# Patient Record
Sex: Female | Born: 1945 | Race: White | Hispanic: No | Marital: Married | Smoking: Former smoker
Health system: Southern US, Community
[De-identification: ages and names within clinical notes are randomized; demographics above are authoritative.]

## PROBLEM LIST (undated history)

## (undated) DIAGNOSIS — Z803 Family history of malignant neoplasm of breast: Secondary | ICD-10-CM

## (undated) DIAGNOSIS — Z923 Personal history of irradiation: Secondary | ICD-10-CM

## (undated) DIAGNOSIS — K219 Gastro-esophageal reflux disease without esophagitis: Secondary | ICD-10-CM

## (undated) DIAGNOSIS — I38 Endocarditis, valve unspecified: Secondary | ICD-10-CM

## (undated) DIAGNOSIS — I10 Essential (primary) hypertension: Secondary | ICD-10-CM

## (undated) DIAGNOSIS — J189 Pneumonia, unspecified organism: Secondary | ICD-10-CM

## (undated) DIAGNOSIS — D472 Monoclonal gammopathy: Secondary | ICD-10-CM

## (undated) DIAGNOSIS — M8580 Other specified disorders of bone density and structure, unspecified site: Secondary | ICD-10-CM

## (undated) DIAGNOSIS — J439 Emphysema, unspecified: Secondary | ICD-10-CM

## (undated) DIAGNOSIS — Z9981 Dependence on supplemental oxygen: Secondary | ICD-10-CM

## (undated) DIAGNOSIS — R942 Abnormal results of pulmonary function studies: Secondary | ICD-10-CM

## (undated) DIAGNOSIS — C801 Malignant (primary) neoplasm, unspecified: Secondary | ICD-10-CM

## (undated) DIAGNOSIS — M1990 Unspecified osteoarthritis, unspecified site: Secondary | ICD-10-CM

## (undated) DIAGNOSIS — K5790 Diverticulosis of intestine, part unspecified, without perforation or abscess without bleeding: Secondary | ICD-10-CM

## (undated) DIAGNOSIS — N838 Other noninflammatory disorders of ovary, fallopian tube and broad ligament: Secondary | ICD-10-CM

## (undated) DIAGNOSIS — C44310 Basal cell carcinoma of skin of unspecified parts of face: Secondary | ICD-10-CM

## (undated) DIAGNOSIS — K8689 Other specified diseases of pancreas: Secondary | ICD-10-CM

## (undated) DIAGNOSIS — Z720 Tobacco use: Secondary | ICD-10-CM

## (undated) DIAGNOSIS — K635 Polyp of colon: Secondary | ICD-10-CM

## (undated) DIAGNOSIS — E785 Hyperlipidemia, unspecified: Secondary | ICD-10-CM

## (undated) HISTORY — DX: Abnormal results of pulmonary function studies: R94.2

## (undated) HISTORY — DX: Monoclonal gammopathy: D47.2

## (undated) HISTORY — DX: Other noninflammatory disorders of ovary, fallopian tube and broad ligament: N83.8

## (undated) HISTORY — DX: Polyp of colon: K63.5

## (undated) HISTORY — DX: Tobacco use: Z72.0

## (undated) HISTORY — DX: Essential (primary) hypertension: I10

## (undated) HISTORY — DX: Family history of malignant neoplasm of breast: Z80.3

## (undated) HISTORY — DX: Emphysema, unspecified: J43.9

## (undated) HISTORY — PX: VAGINAL HYSTERECTOMY: SHX2639

## (undated) HISTORY — DX: Hyperlipidemia, unspecified: E78.5

## (undated) HISTORY — PX: TONSILLECTOMY: SHX5217

## (undated) HISTORY — DX: Basal cell carcinoma of skin of unspecified parts of face: C44.310

## (undated) HISTORY — DX: Diverticulosis of intestine, part unspecified, without perforation or abscess without bleeding: K57.90

## (undated) HISTORY — DX: Other specified disorders of bone density and structure, unspecified site: M85.80

---

## 1997-11-26 DIAGNOSIS — D472 Monoclonal gammopathy: Secondary | ICD-10-CM

## 1997-11-26 HISTORY — DX: Monoclonal gammopathy: D47.2

## 1999-03-06 ENCOUNTER — Other Ambulatory Visit: Admission: RE | Admit: 1999-03-06 | Discharge: 1999-03-06 | Payer: Self-pay | Admitting: *Deleted

## 2000-03-29 ENCOUNTER — Emergency Department (HOSPITAL_COMMUNITY): Admission: EM | Admit: 2000-03-29 | Discharge: 2000-03-29 | Payer: Self-pay | Admitting: Emergency Medicine

## 2000-08-26 ENCOUNTER — Encounter: Payer: Self-pay | Admitting: *Deleted

## 2000-08-26 ENCOUNTER — Encounter: Admission: RE | Admit: 2000-08-26 | Discharge: 2000-08-26 | Payer: Self-pay | Admitting: *Deleted

## 2001-02-25 ENCOUNTER — Encounter: Payer: Self-pay | Admitting: Internal Medicine

## 2001-02-25 ENCOUNTER — Encounter: Admission: RE | Admit: 2001-02-25 | Discharge: 2001-02-25 | Payer: Self-pay | Admitting: Internal Medicine

## 2001-06-11 ENCOUNTER — Encounter: Payer: Self-pay | Admitting: Oncology

## 2001-06-11 ENCOUNTER — Ambulatory Visit (HOSPITAL_COMMUNITY): Admission: RE | Admit: 2001-06-11 | Discharge: 2001-06-11 | Payer: Self-pay | Admitting: Oncology

## 2001-08-27 ENCOUNTER — Encounter: Payer: Self-pay | Admitting: Internal Medicine

## 2001-08-27 ENCOUNTER — Encounter: Admission: RE | Admit: 2001-08-27 | Discharge: 2001-08-27 | Payer: Self-pay | Admitting: Internal Medicine

## 2002-05-21 ENCOUNTER — Encounter: Payer: Self-pay | Admitting: Surgery

## 2002-05-21 ENCOUNTER — Encounter: Admission: RE | Admit: 2002-05-21 | Discharge: 2002-05-21 | Payer: Self-pay | Admitting: Surgery

## 2002-09-15 ENCOUNTER — Encounter: Admission: RE | Admit: 2002-09-15 | Discharge: 2002-09-15 | Payer: Self-pay | Admitting: Internal Medicine

## 2002-09-15 ENCOUNTER — Encounter: Payer: Self-pay | Admitting: Internal Medicine

## 2003-09-21 ENCOUNTER — Encounter: Admission: RE | Admit: 2003-09-21 | Discharge: 2003-09-21 | Payer: Self-pay | Admitting: Internal Medicine

## 2003-12-28 DIAGNOSIS — K635 Polyp of colon: Secondary | ICD-10-CM

## 2003-12-28 HISTORY — DX: Polyp of colon: K63.5

## 2004-01-06 ENCOUNTER — Ambulatory Visit (HOSPITAL_COMMUNITY): Admission: RE | Admit: 2004-01-06 | Discharge: 2004-01-06 | Payer: Self-pay | Admitting: Gastroenterology

## 2005-10-03 ENCOUNTER — Encounter: Admission: RE | Admit: 2005-10-03 | Discharge: 2005-10-03 | Payer: Self-pay | Admitting: Internal Medicine

## 2005-11-26 DIAGNOSIS — N838 Other noninflammatory disorders of ovary, fallopian tube and broad ligament: Secondary | ICD-10-CM

## 2005-11-26 HISTORY — PX: OMENTECTOMY: SHX2098

## 2005-11-26 HISTORY — DX: Other noninflammatory disorders of ovary, fallopian tube and broad ligament: N83.8

## 2005-11-26 HISTORY — PX: BILATERAL SALPINGOOPHORECTOMY: SHX1223

## 2005-12-27 ENCOUNTER — Encounter: Admission: RE | Admit: 2005-12-27 | Discharge: 2005-12-27 | Payer: Self-pay | Admitting: Internal Medicine

## 2006-01-31 ENCOUNTER — Encounter: Admission: RE | Admit: 2006-01-31 | Discharge: 2006-01-31 | Payer: Self-pay | Admitting: Internal Medicine

## 2006-02-13 ENCOUNTER — Ambulatory Visit: Admission: RE | Admit: 2006-02-13 | Discharge: 2006-02-13 | Payer: Self-pay | Admitting: Gynecologic Oncology

## 2006-03-05 ENCOUNTER — Inpatient Hospital Stay (HOSPITAL_COMMUNITY): Admission: RE | Admit: 2006-03-05 | Discharge: 2006-03-08 | Payer: Self-pay | Admitting: Obstetrics & Gynecology

## 2006-03-05 ENCOUNTER — Encounter (INDEPENDENT_AMBULATORY_CARE_PROVIDER_SITE_OTHER): Payer: Self-pay | Admitting: *Deleted

## 2006-03-19 ENCOUNTER — Ambulatory Visit: Admission: RE | Admit: 2006-03-19 | Discharge: 2006-03-19 | Payer: Self-pay | Admitting: Gynecologic Oncology

## 2006-09-11 ENCOUNTER — Ambulatory Visit: Admission: RE | Admit: 2006-09-11 | Discharge: 2006-09-11 | Payer: Self-pay | Admitting: Gynecologic Oncology

## 2007-04-23 ENCOUNTER — Ambulatory Visit: Admission: RE | Admit: 2007-04-23 | Discharge: 2007-04-23 | Payer: Self-pay | Admitting: Gynecologic Oncology

## 2007-10-21 ENCOUNTER — Ambulatory Visit: Admission: RE | Admit: 2007-10-21 | Discharge: 2007-10-21 | Payer: Self-pay | Admitting: Gynecologic Oncology

## 2007-11-27 HISTORY — PX: CATARACT EXTRACTION: SUR2

## 2008-04-07 ENCOUNTER — Ambulatory Visit: Admission: RE | Admit: 2008-04-07 | Discharge: 2008-04-07 | Payer: Self-pay | Admitting: Gynecologic Oncology

## 2008-11-26 DIAGNOSIS — C44310 Basal cell carcinoma of skin of unspecified parts of face: Secondary | ICD-10-CM

## 2008-11-26 HISTORY — DX: Basal cell carcinoma of skin of unspecified parts of face: C44.310

## 2008-11-26 HISTORY — PX: BASAL CELL CARCINOMA EXCISION: SHX1214

## 2009-04-13 ENCOUNTER — Ambulatory Visit: Admission: RE | Admit: 2009-04-13 | Discharge: 2009-04-13 | Payer: Self-pay | Admitting: Gynecologic Oncology

## 2009-12-15 ENCOUNTER — Encounter: Admission: RE | Admit: 2009-12-15 | Discharge: 2009-12-15 | Payer: Self-pay | Admitting: Family Medicine

## 2010-04-05 ENCOUNTER — Ambulatory Visit: Admission: RE | Admit: 2010-04-05 | Discharge: 2010-04-05 | Payer: Self-pay | Admitting: Gynecologic Oncology

## 2010-11-26 DIAGNOSIS — K5790 Diverticulosis of intestine, part unspecified, without perforation or abscess without bleeding: Secondary | ICD-10-CM

## 2010-11-26 HISTORY — PX: COLONOSCOPY: SHX174

## 2010-11-26 HISTORY — DX: Diverticulosis of intestine, part unspecified, without perforation or abscess without bleeding: K57.90

## 2010-11-28 LAB — HM COLONOSCOPY

## 2010-12-05 ENCOUNTER — Encounter
Admission: RE | Admit: 2010-12-05 | Discharge: 2010-12-05 | Payer: Self-pay | Source: Home / Self Care | Attending: Gastroenterology | Admitting: Gastroenterology

## 2010-12-22 ENCOUNTER — Other Ambulatory Visit: Payer: Self-pay | Admitting: Family Medicine

## 2010-12-22 DIAGNOSIS — Z1239 Encounter for other screening for malignant neoplasm of breast: Secondary | ICD-10-CM

## 2011-01-01 ENCOUNTER — Ambulatory Visit
Admission: RE | Admit: 2011-01-01 | Discharge: 2011-01-01 | Disposition: A | Discharge status: Home / Self Care | Payer: Self-pay | Source: Ambulatory Visit | Attending: Family Medicine | Admitting: Family Medicine

## 2011-01-01 DIAGNOSIS — Z1239 Encounter for other screening for malignant neoplasm of breast: Secondary | ICD-10-CM

## 2011-03-06 LAB — INHIBIN A: Inhibin-A: <1 pg/mL

## 2011-04-10 NOTE — Consult Note (Signed)
NAME:  Tiffany [name], Tiffany [name] NO.:  [id number]   MEDICAL RECORD NO.:  [id number]          PATIENT TYPE:  OUT   LOCATION:  GYN                          FACILITY:  [facility]   PHYSICIAN:  Paola A. Duard Brady, MD    DATE OF BIRTH:  21-Jul-1946   DATE OF CONSULTATION:  04/13/2009  DATE OF DISCHARGE:                                 CONSULTATION   HISTORY:  The patient is a very pleasant 65 year old with history of  stage IA granulosa cell tumor who underwent exploratory laparotomy, BSO,  pelvic lymphadenectomy and omentectomy March 2007.  All biopsies, lymph  nodes and adhesions were negative.  Inhibin level at the time of  diagnosis was elevated at 77.  When I most recently saw her in May 2009,  her inhibin A was less than 1.0.  She comes in today for followup.  She  is overall doing quite well.   REVIEW OF SYSTEMS:  She denies any chest pain, shortness of breath,  nausea, vomiting, fevers, chills, headaches or visual changes.  She  denies any unintentional weight loss or weight gain.  She did have  cataract surgery in October 2009.  She states that has helped her  significantly, though she does still have to wear her eye glasses due to  an astigmatism.   HEALTH MAINTENANCE:  She is overdue for mammogram.  She has not had one  including last year.  She states she is due for a colonoscopy soon.  We  followed up on the skin changes that she mentioned last year.  She has  not been back to see a dermatologist.   FAMILY HISTORY:  There are no new medical problems.   MEDICATIONS:  None.   PHYSICAL EXAMINATION:  VITAL SIGNS:  Weight 165 pounds, blood pressure  130/70.  GENERAL:  Well-nourished, well-developed female in no acute distress.  NECK:  Supple.  There is no lymphadenopathy, no thyromegaly.  LUNGS:  Clear to auscultation bilaterally.  CARDIOVASCULAR:  Regular rate and rhythm.  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  It measures  approximately 3 mm.  Neck is without lymphadenopathy.  ABDOMEN:  She has a well-healed vertical midline incision.  There is no  evidence of an incisional hernia.  Abdomen is soft, nontender,  nondistended.  There are no palpable masses or hepatosplenomegaly.  Groins are negative for adenopathy.  EXTREMITIES:  There is no edema.  PELVIC:  External genitalia is within normal limits.  Bimanual  examination reveals no masses or nodularity.  RECTAL:  Confirms.   ASSESSMENT:  A 65 year old with history of stage I granulosa cell tumor  who is doing well with no evidence of recurrent disease.   PLAN:  I have encouraged her to follow up with regards to her mammograms  as well as her colonoscopy.  In addition, I have encouraged her to see  dermatology.  She has not been back to see one in some time.  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  She does not recall the name of her  dermatologist, but we have given her a name of one should she needed a  new one.  She will return to see [place] in 1 year p.r.n.  We will check a  total inhibin.      Paola A. Duard Brady, MD  Electronically Signed     PAG/MEDQ  D:  04/13/2009  T:  04/13/2009  Job:  [id number]   cc:   Devoria Albe, M.D.  [address]  [place], [place] [address]   Telford Nab, R.N.  [address]  [place], [place] [address]

## 2011-04-10 NOTE — Consult Note (Signed)
NAME:  Tiffany [name], Tiffany [name] NO.:  [id number]   MEDICAL RECORD NO.:  [id number]          PATIENT TYPE:  OUT   LOCATION:  GYN                          FACILITY:  [facility]   PHYSICIAN:  Paola A. Duard Brady, MD    DATE OF BIRTH:  1946-02-04   DATE OF CONSULTATION:  04/07/2008  DATE OF DISCHARGE:                                 CONSULTATION   Ms. Record is a very pleasant 65 year old with history of stage IA  granulosa cell tumor status post exploratory laparotomy, BSO, pelvic  lymphadenectomy and omentectomy March of 2007.  All biopsies, lymph  nodes and adhesions were negative.  Her inhibin level at the time of  diagnosis was elevated at 77.  She did well postoperatively with  dispositioned to close followup.  I last saw her in November of 2005 at  which time her exam was unremarkable.  Her inhibin was less than 1.  She  comes in today.  She is overall from a physical standpoint doing quite  well.  She has otherwise had a stressful day with an AV and that her  bank card was rejected by the bank and subsequently not returned.  She  needs to go to the bank to get that issue clarified.  She also went in  for consideration of cataract surgery and was diagnosed with glaucoma  and now the cataracts which are bothersome to her, the right being worse  than the left, cannot be fixed until she addresses the glaucoma.  She  has an appointment with a glaucoma specialist in June of 2009.  She  otherwise denies any complaints.  She denies any abdominal pain, chest  pain, shortness of breath, nausea, vomiting, fevers, chills, headaches  or visual changes.  She denies any unintentional weight loss, weight  gain or pelvic pressure.   MEDICATION LIST:  Is reviewed and is unchanged.   HEALTH MAINTENANCE:  She had a colonoscopy 5 years ago.  She will  discuss with her primary physician if she needs another one.  Mammogram,  her last one was right prior to her diagnosis of her ovarian cancer  which  was made when she could not lie on the table for the breast MRI.   PHYSICAL EXAMINATION:  VITAL SIGNS:  Weight 157 pounds which is stable.  GENERAL:  A well-nourished, well-developed female in no acute distress.  NECK:  Supple.  There is no lymphadenopathy, no thyromegaly.  LUNGS:  Clear to auscultation bilaterally.  CARDIOVASCULAR:  Regular rate and rhythm.  ABDOMEN:  Shows a well-healed vertical midline incision.  There is no  evidence of an incisional hernia.  Abdomen is soft, nontender,  nondistended.  There are no palpable masses or hepatosplenomegaly.  Groins are negative for adenopathy.  EXTREMITIES:  There is no edema.  PELVIC:  External genitalia is within normal limits though atrophic.  Bimanual examination reveals no masses or nodularity.  RECTAL:  Confirms.  SKIN:  She has numerous hyperkeratotic raised lesions that could be  consistent with actinic keratoses or even small squamous cell carcinomas  changes.  There  are significant changes consistent with prolonged sun  exposure.   ASSESSMENT:  75. A 65 year old with history of a stage I granulosa cell tumor who is      doing well from an oncologic standpoint and has no evidence of      recurrent disease.  As she is 2 years out we can go to q. yearly      visits.  We will check a total inhibin including A and B today.  2. We have recommend that she go back to the breast center which is      where she has her mammograms and has her mammogram done as she is      over time.  3. She will discuss with her primary care physician when she is due      for repeat colonoscopy.  4. I  have recommended that she see dermatology for followup of      numerous skin lesions.  She will take the responsibility of all the      above.  She will return to see [place] in 1 year.      Paola A. Duard Brady, MD  Electronically Signed     PAG/MEDQ  D:  04/07/2008  T:  04/07/2008  Job:  [id number]   cc:   Devoria Albe, M.D.  [address]  [place],  [place] [address]   Telford Nab, R.N.  [address]  [place], [place] [address]

## 2011-04-10 NOTE — Consult Note (Signed)
NAME:  Tiffany [name], Tiffany [name] NO.:  [id number]   MEDICAL RECORD NO.:  [id number]          PATIENT TYPE:  OUT   LOCATION:  GYN                          FACILITY:  [facility]   PHYSICIAN:  Paola A. Duard Brady, MD    DATE OF BIRTH:  15-Jul-1946   DATE OF CONSULTATION:  04/23/2007  DATE OF DISCHARGE:                                 CONSULTATION   Tiffany [name] is a 65 year old who was noted to have a large abdominopelvic  mass in March of 2007 with a normal CA-125.  In April of 2007 she  underwent exploratory laparotomy, BSO, pelvic lymphadenectomy, and  omentectomy with final pathology consistent with stage Ia granulosa cell  tumor.  All lymph nodes, pelvic adhesions, and biopsies were negative.  Her inhibin was elevated to 77 at the time of surgery.  I last saw her  in October of 2007 at which time her exam was unremarkable.  Her inhibin  was checked and was less than 1.  She was started on Premarin 0.3 mg  daily secondary to vasomotor symptoms that did not respond to medical  management.  She comes in today.  She is overall feeling very well.  She  is sleeping very well.  She occasionally still has hot flashes, but they  are not interfering with her quality of life.  She continues working at  [employer].  She denies any abdominal pain, any chest pain, shortness of  breath, nausea, vomiting, fevers, chills, headaches, or visual changes.  She denies any change in her bowel or bladder habits, any early satiety,  or pelvic pressure.  She feels like she has injured her left thumb, as  it hurts when she buttons her pants and does heavy lifting.  There is  loss of sensory or motor function, just pain associated with that this.  She has not tried any over-the-counter medications.   MEDICATIONS:  Medication list is reviewed and is unchanged.   PHYSICAL EXAMINATION:  VITAL SIGNS:  Weight 162 pounds.  Blood pressure  124/74.  GENERAL:  Well-nourished, well-developed female in no acute distress.  NECK:  Supple.  There is no lymphadenopathy, no thyromegaly.  LUNGS:  Clear to auscultation bilaterally.  CARDIOVASCULAR:  Regular rate and rhythm.  ABDOMEN:  She has a well-healed vertical skin incision.  The abdomen is  soft, nontender, nondistended.  There are no palpable masses or  hepatosplenomegaly.  Groins are negative for adenopathy.  EXTREMITIES:  There is no edema.  PELVIC:  External genitalia is within normal limits.  Bimanual  examination reveals no masses or nodularity.  The vaginal cuff is freely  mobile.  RECTAL:  Confirms.   ASSESSMENT:  A 65 year old with a stage Ia granulosa cell tumor who has  clinically no evidence of recurrent disease, and she is 1 year out from  the time of diagnosis.   PLAN:  1. We will follow up on the results of inhibin from today.  She will      continue taking her Premarin.  2. I have encouraged her take some Tylenol arthritis for her thumb  pain.  Dr. Daphine Deutscher, her primary care physician,      has moved to a new work opportunity, and Ms. Guidroz needs to get a      new physician at [facility] at [address] [facility].  3. She will return to see [place] in 6 months.      Paola A. Duard Brady, MD  Electronically Signed     PAG/MEDQ  D:  04/23/2007  T:  04/23/2007  Job:  [id number]   cc:   Telford Nab, R.N.  [address]  [place], [place] [address]   Devoria Albe, M.D.  [address]  [place], [place] [address]

## 2011-04-10 NOTE — Consult Note (Signed)
NAME:  Tiffany [name], Tiffany [name] NO.:  [id number]   MEDICAL RECORD NO.:  [id number]          PATIENT TYPE:  OUT   LOCATION:  GYN                          FACILITY:  [facility]   PHYSICIAN:  Paola A. Duard Brady, MD    DATE OF BIRTH:  03-30-1946   DATE OF CONSULTATION:  10/21/2007  DATE OF DISCHARGE:                                 CONSULTATION   The patient is a 65 year old with a stage Ia granulosa cell tumor,  status post exploratory laparotomy, BSO, pelvic lymphadenectomy and  omentectomy in March 2007.  All lymph nodes, adhesions and biopsies were  negative.  At the time of her diagnosis her Inhibin was elevated at 77.  She has been followed regularly since that time, most recently in May  2008.  Her exam was unremarkable.  Inhibin A at that time was 1.  She  comes in today for followup.  She is overall doing quite well and denies  any significant complaints.  She denies any chest pain, shortness of  breath, nausea, vomiting, fevers, chills.  She occasionally has some  occasional twinge-type abdominal discomfort, but those are rare.  They  never wake her up at night.  Her weight loss and weight gain issues are  quite stable.  Her weight has been quite stable.  She is looking forward  to the holiday season.   Medication list is reviewed and is unchanged.   PHYSICAL EXAMINATION:  VITAL SIGNS:  Weight 160 pounds, blood pressure  121/78.  GENERAL:  Well-nourished, well-developed female in no acute distress.  NECK:  Supple.  There is no lymphadenopathy, no thyromegaly.  LUNGS:  Clear to auscultation bilaterally.  CARDIOVASCULAR:  Regular rate and rhythm.  ABDOMEN:  Shows a well-healed vertical skin incision.  There is no  evidence of an incisional hernia.  Abdomen is soft, nontender,  nondistended.  There are no palpable masses or hepatosplenomegaly.  Groins are negative for adenopathy.  EXTREMITIES:  There is no edema.  PELVIC:  External genitalia is within normal limits.   Bimanual  examination reveals no masses or nodularity.  Rectal confirms.   ASSESSMENT:  A 65 year old with a history of stage Ia granulosa cell  tumor who has no evidence of recurrent disease.   PLAN:  We will follow up the results of her Inhibin from today.  She  will  return to see me in 6 months.  At that time, she will be 2 years  out from the diagnosis and she can go to yearly visits.      Paola A. Duard Brady, MD  Electronically Signed     PAG/MEDQ  D:  10/21/2007  T:  10/21/2007  Job:  [id number]   cc:   Telford Nab, R.N.  [address]  [place], [place] [address]   Devoria Albe, M.D.  [address]  [place], [place] [address]

## 2011-04-11 ENCOUNTER — Ambulatory Visit: Payer: 59 | Attending: Gynecologic Oncology | Admitting: Gynecologic Oncology

## 2011-04-11 ENCOUNTER — Other Ambulatory Visit: Payer: Self-pay | Admitting: Gynecologic Oncology

## 2011-04-11 DIAGNOSIS — Z9079 Acquired absence of other genital organ(s): Secondary | ICD-10-CM | POA: Insufficient documentation

## 2011-04-11 DIAGNOSIS — D3910 Neoplasm of uncertain behavior of unspecified ovary: Secondary | ICD-10-CM | POA: Insufficient documentation

## 2011-04-12 NOTE — Consult Note (Signed)
  NAME:  CRESSIE, BETZLER NO.:  [id number]  MEDICAL RECORD NO.:  [id number]           PATIENT TYPE:  O  LOCATION:  GYN                          FACILITY:  [facility]  PHYSICIAN:   A. Duard Brady, MD    DATE OF BIRTH:  08-17-1946  DATE OF CONSULTATION:  04/11/2011 DATE OF DISCHARGE:                                CONSULTATION   HISTORY:  Ms. Folmar is a very pleasant 65 year old with stage IA granulosa cell tumor who underwent exploratory laparotomy, BSO, pelvic lymphadenectomy, and omentectomy in March 2007.  All biopsies were negative.  Her inhibin level at the time of diagnosis was elevated to 77.  I  last saw her in May 2011, at which time her exam was unremarkable and her inhibin A level was 1.2.  She comes in today for followup.  She is overall doing quite well and really denies any significant complaints.  REVIEW OF SYSTEMS:  She denies any chest pain, shortness of breath, nausea, vomiting, fevers, chills, headaches or visual change.  She denies any significant change in bowel or bladder habits.  She has not needed to see her dermatologist as she has had no new dermatologic lesions or issues.  MEDICATIONS:  Multivitamins, simvastatin.  HEALTH MAINTENANCE:  She had a colonoscopy as well as mammogram in 2012.  FAMILY HISTORY:  There are no new medical problems.  PHYSICAL EXAMINATION:  VITAL SIGNS:  Weight 164 pounds which is stable, blood pressure 120/82, temperature 98.2, pulse 66, respirations 16. GENERAL:  A well-nourished, well-developed female, in no acute distress. NECK:  Supple.  There is no lymphadenopathy, no thyromegaly. LUNGS:  Clear to auscultation bilaterally. CARDIOVASCULAR EXAM:  Regular rate and rhythm. ABDOMEN:  Shows well-healed vertical midline incision.  Abdomen is soft, nontender, nondistended.  There are no palpable masses or hepatosplenomegaly.  Groins are negative for adenopathy. EXTREMITIES:  There is no edema. PELVIS:  External  genitalia is within normal limits.  Bimanual examination reveals no masses or nodularity.  Rectal confirms.  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  PLAN:  We will follow up the results of her inhibin from today.  She will return to see her primary physician for routine surveillance in 1 year.  She can follow up with her primary physician on a yearly basis with an inhibin on once a year basis.  If there is any issue she knows, I will be more than happy to see her in the future should the need arise.      A. Duard Brady, MD     PAG/MEDQ  D:  04/11/2011  T:  04/11/2011  Job:  [id number]  cc:   Telford Nab, R.N. [address] [place], [place] [address]  Elinor Parkinson. Worthy Rancher, M.D. Fax: [phone]  [facility]  Electronically Signed by Cleda Mccreedy MD on 04/12/2011 01:54:10 PM

## 2011-04-13 NOTE — Discharge Summary (Signed)
NAME:  Tiffany [name], Tiffany [name] NO.:  [id number]   MEDICAL RECORD NO.:  [id number]          PATIENT TYPE:  INP   LOCATION:  1304                         FACILITY:  [facility]   PHYSICIAN:  Roseanna Rainbow, M.D.DATE OF BIRTH:  1946-05-05   DATE OF ADMISSION:  03/05/2006  DATE OF DISCHARGE:  03/08/2006                                 DISCHARGE SUMMARY   CHIEF COMPLAINT:  The the patient is a 65 year old with a large complex  abdominal mass who presents for operative management.  Please see the  dictated history and physical as per Dr. Cleda Mccreedy.   HOSPITAL COURSE:  The patient was admitted and underwent a bilateral  salpingo-oophorectomy, pelvic lymphadenectomy and omentectomy.  Please see  the dictated operative summary.  In the initial postoperative period, the  patient had decreased urine output that responded to crystalloid boluses.  On postoperative day #1, her basic metabolic profile was normal, and her  hemoglobin was 11.5. On postoperative day #2, her hemoglobin was 10, and her  basic metabolic profile remained normal.  On postoperative day #3, she  reported bowel movement and had normal active bowel sounds on exam. She was  tolerated regular diet, and she was discharged to home.   DISCHARGE DIAGNOSIS:  Ovarian granulosa cell tumor.   PROCEDURE:  Bilateral salpingo-oophorectomy, pelvic lymphadenectomy and  omentectomy.   CONDITION:  Good.   DIET:  Regular.   ACTIVITY:  Progressive activity, pelvic rest.   MEDICATIONS:  Resume home medications, Percocet 1-2 tablets every 6 hours as  needed.   DISPOSITION:  The patient was to follow up in the GYN oncology office on  April 16.      Roseanna Rainbow, M.D.  Electronically Signed     LAJ/MEDQ  D:  04/09/2006  T:  04/09/2006  Job:  [id number]   cc:   Darius Bump, M.D.  Fax: [phone]   Telford Nab, R.N.  [address]  [place], [place] [address]

## 2011-04-13 NOTE — Consult Note (Signed)
NAME:  Tiffany [name], Tiffany [name] NO.:  [id number]   MEDICAL RECORD NO.:  [id number]          PATIENT TYPE:  OUT   LOCATION:  GYN                          FACILITY:  [facility]   PHYSICIAN:  Paola A. Duard Brady, MD    DATE OF BIRTH:  11-22-1946   DATE OF CONSULTATION:  03/19/2006  DATE OF DISCHARGE:                                   CONSULTATION   HISTORY:  Tiffany [name] is a very pleasant 65 year old gravida 1, para 1 who was  referred to [place] by Dr. Madison Hickman.  She was initially seen by [place] in March  of 2007.  At that time, she was noted to have a large abdominal pelvic mass  measuring approximately 17 x 17 x 16 cm on CT scan with a CA-125 that was  22.  After discussion, she opted for surgical evaluation and on March 05, 2006 she underwent exploratory laparotomy, a BSO, pelvic lymphadenectomy,  omentectomy and surgical staging of ovarian cancer.  Final pathology was  consistent with a stage IA granulosa cell tumor.  All lymph nodes, pelvic  adhesions, and biopsies were negative.  The left ovary revealed a stage IA  granulosa cell tumor measuring 23 cm.  It was not involving the surface.  She comes in today for her postoperative check.  She is overall doing quite  well and denies any significant complaints.  The biggest issue is that she  is taking naps a couple per day, but otherwise her energy level is  improving.  She has had normal return of her bowel and bladder function.  She is eating a regular diet without complaints.   PHYSICAL EXAMINATION:  VITAL SIGNS:  Weight 118/80, pulse 60, respirations  18, weight 150 pounds.  GENERAL:  A well-nourished well-developed female in no acute distress.  ABDOMEN:  She has well-healed surgical incision per ABDOMEN:  She has a well-  healed surgical incision.  The abdomen is soft and nontender.  The incision  is clean, dry, and intact with Steri-Strips.   ASSESSMENT:  A 65 year old with a stage IA granulosa cell tumor who is doing  well  postoperatively.  I discussed with her that these tumors can recur;  however, she does not need any adjuvant therapy and her likelihood of cure  is  exceeding high.  However, we would like to see her every 6 months; and she  understands the need for followup.  She will return to see [place] in 6 months.  She will continue to be out of work until May 14 which will be her full 4  weeks after surgery.  She is to call should she have any problems prior to  that time.      Paola A. Duard Brady, MD  Electronically Signed     PAG/MEDQ  D:  03/19/2006  T:  03/20/2006  Job:  [id number]   cc:   Roseanna Rainbow, M.D.  Fax: [phone]   Darius Bump, M.D.  Fax: [phone]   Telford Nab, R.N.  [address]  [place], [place] [address]

## 2011-04-13 NOTE — Op Note (Signed)
NAME:  Tiffany [name], Tiffany [name] NO.:  [id number]   MEDICAL RECORD NO.:  [id number]          PATIENT TYPE:  INP   LOCATION:  0010                         FACILITY:  [facility]   PHYSICIAN:  De Blanch, M.D.DATE OF BIRTH:  1946/03/03   DATE OF PROCEDURE:  03/05/2006  DATE OF DISCHARGE:                                 OPERATIVE REPORT   PREOPERATIVE DIAGNOSIS:  Complex abdominal and pelvic mass.   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   PROCEDURE:  Exploratory laparotomy, bilateral salpingo-oophorectomy, pelvic  lymphadenectomy, omentectomy (surgical staging of ovarian cancer).   SURGEON:  De Blanch, M.D.   ASSISTANT:  Roseanna Rainbow, M.D.  and Telford Nab, R.N.   ANESTHESIA:  General orotracheal tube.   ESTIMATED BLOOD LOSS:  200 mL.   SURGICAL FINDINGS:  At the time of exploratory laparotomy the patient had a  large 24 cm cystic mass arising from the left ovary.  The capsule was smooth  with no excrescences.  There were some adhesions of the small bowel and the  pelvic peritoneum to the mass.  Exploration the upper abdomen and pelvis was  entirely negative, except for some enlarged left pelvic lymph nodes.  At the  completion of the procedure, there was no gross residual disease.   DESCRIPTION OF PROCEDURE:  The patient was brought to the operating room and  after satisfactory attainment of general anesthesia was placed in the  modified lithotomy position in [place] stirrups.  The anterior abdominal wall,  perineum, and vagina were prepped with Betadine.  A Foley catheter was  placed.  The abdomen was entered through a left paramedian incision.  Peritoneal washings were obtained.  The upper abdomen and pelvis were  explored with the above-noted findings and aside from some a few adhesions,  the mass was smooth and mobile.  In order to reduce the size of the mass a  pursestring suture was placed in the  mass; and an incision was made in the  capsule.  A pool sucker was inserted and approximately 2000 mL of bloody  fluid was removed.  There was still a considerable portion of the mass which  was solid and could not be further reduced in size.   The Bookwalter retractor was positioned; and the bowel was packed out of the  pelvis.  The left pelvic sidewall was opened.  The patient had very large  ovarian veins which were isolated away from the ureter, cross clamped,  divided, free tied and suture ligated.  The retroperitoneal spaces on the  left side the pelvis were opened with identification the ureter.  The ureter  was mobilized down to the level of the uterine vessels.  The uterine vessels  were doubly clipped as they crossed the ureter.  The mass was mobilized  further medially.  Adherence to the vaginal for vaginal angle was incised as  was the posterior aspect of the broad ligament further mobilize the mass of  the pelvis.  Some pelvic adhesions were lysed and ultimately submitted for  permanent section separately to  determine where there was any metastatic  disease.  Ultimately the mass was freed from the pelvic sidewall and deep  pelvis and submitted to frozen section with the above-noted findings.  Hemostasis was achieved with hemoclips and suture ligatures on the vaginal  angle; and uterine vessels.   Attention was turned to the right side of the pelvis.  The right  retroperitoneal space was opened; the vessels and ureter were identified.  The ovarian vessels were skeletonized, clamped, cut, free-tied, and suture  ligated.  The right tube and ovary appeared normal.  They were excised and  submitted to pathology.   It was noted that the left pelvic lymph nodes were enlarged; and, therefore,  a pelvic lymphadenectomy was performed on the left side of the pelvis  excising lymph nodes from the external iliac artery and vein, internal iliac  artery, and obturator fossa.  Throughout  the dissection, care was taken to  avoid injury to the vessels and the obturator nerve.  Hemostasis, again,  achieved with cautery and hemoclips.   At this juncture, the pathologists phoned the room indicating this believed  that this is most likely a granulosa cell tumor.  In order to finished  staging an omentectomy was performed.  The omentum was removed from the  transverse colon, vascular pedicles being clamped, divided and free tied  using 2-0 Vicryl.  The upper abdomen was, again, explored.  The small bowel  was run from the ligament of Treitz to the cecum.  The appendix appeared  normal.  There were some small bowel adhesions which had been previously  lysed from the mass; and, therefore, these were excised and submitted to  pathology as separate specimens.   At this point there was no gross residual disease.  Packs and retractors  were removed.  The pelvis and abdomen were irrigated copiously with saline.  The anterior abdominal wall was closed in layers, the first being a running  mass closure, using #1 PDS.  Subcutaneous tissue was irrigated.  Hemostasis  achieved with cautery and the skin closed with skin staples.  The patient  was awakened from anesthesia and taken to the recovery room in satisfactory  condition.  Sponge, needle and instrument counts were correct x2.      De Blanch, M.D.  Electronically Signed     DC/MEDQ  D:  03/05/2006  T:  03/05/2006  Job:  [id number]   cc:   Roseanna Rainbow, M.D.  Fax: [phone]   Darius Bump, M.D.  Fax: [phone]   Telford Nab, R.N.  [address]  [place], [place] [address]

## 2011-04-13 NOTE — Consult Note (Signed)
NAME:  Tiffany [name], Tiffany [name] NO.:  [id number]   MEDICAL RECORD NO.:  [id number]          PATIENT TYPE:  OUT   LOCATION:  GYN                          FACILITY:  [facility]   PHYSICIAN:  Paola A. Duard Brady, MD    DATE OF BIRTH:  Dec 11, 1945   DATE OF CONSULTATION:  09/11/2006  DATE OF DISCHARGE:                                   CONSULTATION   Tiffany [name] is a very pleasant 65 year old gravida 1, para 1 who was seen by [place]  in March 2007.  At that time, she was noted to have a large abdominal pelvic  mass measuring 17 x 17 x 16 cm with a normal CA-125.  On March 05, 2006, she  underwent exploratory laparotomy, BSO, pelvic lymphadenectomy, omentectomy.  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  All  lymph nodes, pelvic adhesions, and biopsies were negative.  Her inhibin was  elevated at the time of surgery at 77.  She comes in today for follow-up.  She is overall doing quite well.  We last saw her in April 2007.  She states  she is not really sleeping well.  About every other night, she will wake up  once a night secondary to night sweats where she is soaking wet, needs to  change her bed linens and her nightgown.  She has been on hormone  replacement therapy in the past, did not have no issues with it, and it did  help with her vasomotor symptoms in the past.  She has tried Animator.  That  did not really help her.  She tried black cohosh tea without significant  benefit.  She does occasionally complain of some abdominal pressure at the  incision, but not deep after cleaning her house.   REVIEW OF SYSTEMS:  She otherwise denies any chest pain, shortness of  breath, nausea, vomiting, fevers, chills, chest pain, shortness of breath.  She denies any change in her bowel or bladder habits.   PHYSICAL EXAMINATION:  VITAL SIGNS:  Weight 159 pounds, blood pressure  130/82.  GENERAL:  Well-nourished, well-developed female in no acute  distress.  NECK:  Supple with  no lymphadenopathy, no thyromegaly.  LUNGS:  Clear to auscultation bilaterally.  CARDIOVASCULAR:  Regular rate and rhythm.  ABDOMEN:  Shows well-healed vertical skin incision.  Abdomen is soft,  nontender, nondistended.  There are no palpable masses or  hepatosplenomegaly.  Groins are negative for adenopathy.  Bi-manual  examination reveals no masses or nodularity rectal confirms.   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   PLAN:  1. Will check an inhibin today.  2. She was given a prescription for Premarin 0.3 mg to take q. day.  3. She will return to see [place] in 6 months.      Paola A. Duard Brady, MD  Electronically Signed     PAG/MEDQ  D:  09/11/2006  T:  09/12/2006  Job:  [id number]   cc:   Roseanna Rainbow, M.D.  Fax: [phone]  Darius Bump, M.D.  Fax: [phone]   Telford Nab, R.N.  [address]  [place], [place] [address]

## 2011-04-13 NOTE — Op Note (Signed)
NAME:  Tiffany [name], PFEFFER NO.:  [id number]   MEDICAL RECORD NO.:  [id number]                   PATIENT TYPE:  AMB   LOCATION:  ENDO                                 FACILITY:  [facility]   PHYSICIAN:  Danise Edge, M.D.                DATE OF BIRTH:  1946/10/07   DATE OF PROCEDURE:  01/06/2004  DATE OF DISCHARGE:                                 OPERATIVE REPORT   PROCEDURE:  Screening colonoscopy with polypectomy.   INDICATIONS FOR PROCEDURE:  [name] is a 65 year old female born  06-28-1946.  [name] is scheduled to undergo her first screening  colonoscopy with polypectomy to prevent colon cancer.   ENDOSCOPIST:  Danise Edge, M.D.   PREMEDICATION:  Versed 5 mg, Demerol 50 mg.   DESCRIPTION OF PROCEDURE:  After obtaining informed consent, [name] was  placed in the left lateral decubitus position. I administered intravenous  Demerol and intravenous Versed to achieve conscious sedation for the  procedure. The patient's blood pressure, oxygen saturation and cardiac  rhythm were monitored throughout the procedure and documented in the medical  record.   Anal inspection was normal. Digital rectal exam was normal. The Olympus  adjustable pediatric colonoscope was introduced into the rectum and advanced  to the cecum. Colonic preparation for the exam today was satisfactory.   RECTUM:  Normal.   SIGMOID COLON AND DESCENDING COLON:  From the distal sigmoid colon at  approximately 35 cm from the anal verge, a 2 mm sessile polyp was removed  with the electrocautery snare.  I failed to retrieve the polyp for  pathological evaluation.   SIGMOID COLON:  Normal.   SPLENIC FLEXURE:  Normal.   TRANSVERSE COLON:  Normal.   HEPATIC FLEXURE:  Normal.   ASCENDING COLON:  Normal.   CECUM AND ILEOCECAL VALVE:  Normal.   ASSESSMENT:  A small polyp was removed from the distal sigmoid colon with  the electrocautery snare.  I failed to retrieve any  tissue for pathological  evaluation post polypectomy.   RECOMMENDATIONS:  Repeat colonoscopy in five years.                                               Danise Edge, M.D.    MJ/MEDQ  D:  01/06/2004  T:  01/06/2004  Job:  [id number]   cc:   Darius Bump, M.D.  [phone] [address]  [place] [address]  Fax: [phone]

## 2011-04-13 NOTE — Consult Note (Signed)
NAME:  Tiffany [name], Tiffany [name] NO.:  [id number]   MEDICAL RECORD NO.:  [id number]          PATIENT TYPE:  OUT   LOCATION:  GYN                          FACILITY:  [facility]   PHYSICIAN:  Paola A. Duard Brady, MD    DATE OF BIRTH:  Oct 24, 1946   DATE OF CONSULTATION:  02/13/2006  DATE OF DISCHARGE:                                   CONSULTATION   The patient is seen today in consultation at the request of Dr. Madison Hickman, [facility].  The patient is seen today in  consultation at the request of Dr. Daphine Deutscher.   Mrs. Footman is a very pleasant, 65 year old, gravida 1, para 1, who was seen  by Dr. Daphine Deutscher December 06, 2005 for a physical examination.  At that time,  she was doing quite well.  Subsequent followup was made, and she was seen by  Dr. Daphine Deutscher on February 07, 2006.  At that time, there was a questionable  abdominal pelvic mass in the right lower quadrant.  The patient had  complaints of what was described as stomach problems for 2 weeks.  She had  some pain to lean on her stomach.  A CT scan of the abdomen and pelvis was  done on March 15 that revealed a 17-x-17-16 what is written as millimeter  but is most certainly centimeter, cystic mass and solid component.  The was  ventral bulging of the overlying abdominal wall, greater on the right. The  mass appeared to splay the large and small bowel loops.  There was no normal  anatomy noted. With regards to the ovaries, she had a small amount of pelvic  ascites.  There was no nodal disease.  No omental caking.  The liver,  spleen, pancreas, adrenals and gallbladder appeared unremarkable.  For this  reason, she is referred to [place] today.  She did have a CA125 that was drawn  that was 22.  Speaking with the patient regarding this, she states she has  been menopausal for many years.  She did undergo a vaginal hysterectomy  secondary to desire for contraception, which was done vaginally many years  ago.  She  occasionally has night sweats.  She has been complaining of  increasing pelvic pain with stress urinary and urge incontinence.  She  states it feels like she has a baby's head on her bladder.  In January she  started noticing some pain if she would bend over to shave her legs but did  not think anything of it.  She is no longer able to lie on her stomach.  She  had a mammogram in November of 2006, which showed significantly dense breast  tissue.  Therefore she underwent a breast MRI, and she was not able to lie  supine on the table secondary to abdominal pain.  Other than the pressure,  she denies any change in her bladder habits.  She denies any vaginal, rectal  or urinary-type bleeding.  She has noticed a slightly decreased flow in her  urine flow.  She denies any change  in her bowel other than having to strain  somewhat more to have a bowel movement.  She denies any change in stool  caliber or any blood.  Her clothes are fitting a little tighter.  She denies  any night sweats, unintentional weight loss, weight gain, early satiety,  nausea, vomiting, fevers, chills, chest pain or shortness of breath.   PAST MEDICAL HISTORY:  None.   PAST SURGICAL HISTORY:  Vaginal hysterectomy for contraception.  She did  have skin cancer, questionable type, removed at the age of 65.   MEDICATIONS:  None.   ALLERGIES:  None.   SOCIAL HISTORY:  She smokes a pack for day.  She has done it for 40 years.  She denies the use of alcohol.  She is married.  She works in Airline pilot at  [employer].  Her son is 41 years old, and she has an 41-year-old grandson.   FAMILY HISTORY:  Her mother died of a stroke at the age of 50.  Maternal  aunt with breast cancer in her 39s or 17s.   HEALTH MAINTENANCE:  She had a colonoscopy 2 years ago for benign polyps.  She is up to date on her mammograms.   PHYSICAL EXAMINATION:  VITAL SIGNS:  Weight 161 pounds.  Height 5 feet 8  inches.  Blood pressure 142/84, pulse 96.   GENERAL APPEARANCE:  A well-nourished, well-developed female in no acute  distress.  NECK:  Supple.  There is no lymphadenopathy, no thyromegaly.  LUNGS:  Clear to auscultation bilaterally.  CARDIOVASCULAR:  Regular rate and rhythm.  ABDOMEN:  She has an abdominal and pelvic mass that is palpable four  fingerbreadths above the umbilicus.  It is minimally tender.  There is more  tenderness in the bilateral deep lower quadrants.  Groins are negative for  adenopathy.  EXTREMITIES:  There is no edema.  PELVIC:  External genitalia are within normal limits.  Bimanual examination:  The cervix and corpus are palpably and surgically absent.  She has a large  abdominal pelvic mass measuring approximately 20 cm.  It is mobile though  somewhat tender.  Rectovaginal exam:  The rectal mucosa slides over the mass  easily.  There is no nodularity.   ASSESSMENT:  A 65 year old with a large complex abdominal mass, normal  CA125, who on CT imaging does not appear to have any adenopathy, omental  caking or ascites.  I discussed with her that I do not feel that this  represents an ovarian malignancy.  However, this cannot be excluded with  100% certainty secondary to her postmenopausal status and its complex  nature.  Therefore surgical intervention is recommended.  Because of the  size, I do not think this can be in a minimally invasive fashion, and she  will require an exploratory laparotomy.  The patient's questions regarding  this were elicited.  She knows the mass will be removed and sent for frozen  section.  If it is malignant, we will proceed with staging biopsies,  lymphadenectomies and omentectomy.  If it is benign, the procedure will be  terminated at that point.  We are trying to schedule her surgery for next  week.  She knows if it is able to be scheduled on a day that I am here, I  will be happily performing her surgery.  Otherwise one of my partners may be.  Her questions were elicited and  answered to her satisfaction.  She was  given our card should she have any additional  questions.  The risks of  surgery including bleeding, infection, injury to surrounding organs and  thromboembolic disease were discussed with the patient, and she wishes to  proceed.      Paola A. Duard Brady, MD  Electronically Signed     PAG/MEDQ  D:  02/13/2006  T:  02/14/2006  Job:  [id number]   cc:   Darius Bump, M.D.  Fax: [phone]   Telford Nab, R.N.  [address]  [place], [place] [address]

## 2011-04-14 LAB — INHIBIN A: Inhibin-A: 1.8 pg/mL

## 2011-09-04 LAB — MISCELLANEOUS TEST: Miscellaneous Test: 81672

## 2012-04-03 ENCOUNTER — Ambulatory Visit (INDEPENDENT_AMBULATORY_CARE_PROVIDER_SITE_OTHER): Payer: BC Managed Care – PPO | Admitting: Family Medicine

## 2012-04-03 ENCOUNTER — Encounter: Payer: Self-pay | Admitting: Family Medicine

## 2012-04-03 VITALS — BP 130/78 | HR 84 | Temp 97.9°F | Ht 67.0 in | Wt 162.0 lb

## 2012-04-03 DIAGNOSIS — F17200 Nicotine dependence, unspecified, uncomplicated: Secondary | ICD-10-CM

## 2012-04-03 DIAGNOSIS — R071 Chest pain on breathing: Secondary | ICD-10-CM

## 2012-04-03 DIAGNOSIS — Z87891 Personal history of nicotine dependence: Secondary | ICD-10-CM | POA: Insufficient documentation

## 2012-04-03 DIAGNOSIS — R0781 Pleurodynia: Secondary | ICD-10-CM

## 2012-04-03 NOTE — Patient Instructions (Signed)
Get chest x-ray at [facility].  Moist heat and anti-inflammatories (Aleve or motrin/ibuprofen) will help with the pain.  PLEASE try and quit smoking.  Remember the free smoking cessation classes at [facility], and 1800-QUITNOW and [email or web address] are resources to help you.

## 2012-04-03 NOTE — Progress Notes (Signed)
Chief Complaint  Patient presents with  . Flank Pain    sharp pain on her left side x several months but has worsened in the last week.   HPI:  Pain at L waistline for a couple of months, much worse yesterday.  Hurts more with deep breaths--sharp pain.  Increased pain with coughing.  Not hurting at present, when breathing comfortably, but gets twinges with deeper breaths.  Denies any injury or change in activity.  She continues to smoke, about 1.5 PPD. She has some shortness of breath when walking up hills only.    Felt funny when she voided (in vaginal area) a few weeks ago, using cranberry juice--a little better.  Feels very different than previous UTI's.  Last GYN exam was a year ago, due.  Past Medical History  Diagnosis Date  . Hyperlipidemia   . Tobacco abuse     Past Surgical History  Procedure Date  . Bilateral salpingoophorectomy 2007    cancerous tumor R ovary  . Vaginal hysterectomy late 20's  . Tonsillectomy age 69  . Cataract extraction     bilateral    History   Social History  . Marital Status: Married    Spouse Name: N/A    Number of Children: 1  . Years of Education: N/A   Occupational History  . retired Chemical engineer)    Social History Main Topics  . Smoking status: Current Everyday Smoker -- 1.5 packs/day for 50 years  . Smokeless tobacco: Never Used  . Alcohol Use: Yes     maybe 1-2 times per year.  . Drug Use: No  . Sexually Active: Not on file   Other Topics Concern  . Not on file   Social History Narrative   Lives with husband, 1 cat.  Son lives in [place]    Family History  Problem Relation Age of Onset  . Stroke Mother   . Hypertension Father   . Diabetes Maternal Aunt   . Cancer Maternal Uncle     throat cancer, lung cancer (smoker)  . Diabetes Maternal Aunt   . Diabetes Maternal Aunt     Current outpatient prescriptions:Calcium-Vitamin D (CALTRATE 600 PLUS-VIT D PO), Take 1 each by mouth daily., Disp: , Rfl: ;  Multiple  Vitamins-Minerals (HAIR/SKIN/NAILS PO), Take 1 tablet by mouth daily., Disp: , Rfl: ;  simvastatin (ZOCOR) 20 MG tablet, Take 20 mg by mouth every evening., Disp: , Rfl:   No Known Allergies  ROS:  Denies fevers, URI symptoms, slight smoker's cough.  Denies chest pain, palpitations, shortness of breath (sometimes with hills, okay if walks flat). Denies edema, skin rashes, bleeding/bruising.  Denies nausea, vomiting, heartburn, abdominal pain, bowel changes.   Had a bad cough in the fall, treated for bronchitis, no further problems.  Denies GI complaints or skin concerns.  PHYSICAL EXAM: BP 130/78  Pulse 84  Temp(Src) 97.9 F (36.6 C) (Oral)  Ht 5\' 7"  (1.702 m)  Wt 162 lb (73.483 kg)  BMI 25.37 kg/m2 Well developed, pleasant elderly female (appearing older than stated age) in no distress.  Occasional dry cough. HEENT:  PERRL, EOMI, conjunctiva clear.  TM's and EAC's normal.  OP clear, dentures.   Neck: no lymphadenopathy or thyromegaly Heart: regular rate and rhythm without murmur Lungs: clear bilaterally Abdomen: soft, nontender, no organomegaly or mass Area of tenderness is at L lower costal margin, laterally at floating ribs.  Slightly tender to palpation, but increased pain with deep inspiration.  Back: no spine  or CVA tenderness Extremities: no edema, normal pulses Skin: no rash Psych: normal mood, affect, hygiene and grooming  ASSESSMENT/PLAN: 1. Pleuritic chest pain  DG Chest 2 View  2. Tobacco use disorder     Pleuritic chest wall pain. CXR Heat, NSAID's  Sign release form to get [facility] records.  Sound as though she is due for med check and labs.  Reports having run out of meds.

## 2012-04-04 ENCOUNTER — Ambulatory Visit
Admission: RE | Admit: 2012-04-04 | Discharge: 2012-04-04 | Disposition: A | Discharge status: Home / Self Care | Payer: BC Managed Care – PPO | Source: Ambulatory Visit | Attending: Family Medicine | Admitting: Family Medicine

## 2012-04-04 DIAGNOSIS — R0781 Pleurodynia: Secondary | ICD-10-CM

## 2012-04-15 ENCOUNTER — Encounter: Payer: Self-pay | Admitting: Medical

## 2012-04-15 ENCOUNTER — Ambulatory Visit (INDEPENDENT_AMBULATORY_CARE_PROVIDER_SITE_OTHER): Payer: BC Managed Care – PPO | Admitting: Medical

## 2012-04-15 VITALS — BP 132/78 | HR 100 | Temp 101.2°F | Resp 16 | Wt 157.0 lb

## 2012-04-15 DIAGNOSIS — R0602 Shortness of breath: Secondary | ICD-10-CM

## 2012-04-15 DIAGNOSIS — R059 Cough, unspecified: Secondary | ICD-10-CM

## 2012-04-15 DIAGNOSIS — R05 Cough: Secondary | ICD-10-CM

## 2012-04-15 DIAGNOSIS — F17200 Nicotine dependence, unspecified, uncomplicated: Secondary | ICD-10-CM

## 2012-04-15 DIAGNOSIS — Z720 Tobacco use: Secondary | ICD-10-CM

## 2012-04-15 DIAGNOSIS — R509 Fever, unspecified: Secondary | ICD-10-CM

## 2012-04-15 LAB — CBC WITH DIFFERENTIAL/PLATELET
Basophils Absolute: 0 10*3/uL (ref 0.0–0.1)
HCT: 41.5 % (ref 36.0–46.0)
Lymphocytes Relative: 8 % — ABNORMAL LOW (ref 12–46)
MCV: 93.5 fL (ref 78.0–100.0)
Monocytes Absolute: 1.9 10*3/uL — ABNORMAL HIGH (ref 0.1–1.0)
Monocytes Relative: 13 % — ABNORMAL HIGH (ref 3–12)
Neutro Abs: 11 10*3/uL — ABNORMAL HIGH (ref 1.7–7.7)
Neutrophils Relative %: 79 % — ABNORMAL HIGH (ref 43–77)
RBC: 4.44 MIL/uL (ref 3.87–5.11)
WBC: 14.1 10*3/uL — ABNORMAL HIGH (ref 4.0–10.5)

## 2012-04-15 MED ORDER — LEVOFLOXACIN 500 MG PO TABS: 500.0000 mg | ORAL_TABLET | Freq: Every day | ORAL | Status: AC

## 2012-04-15 MED ORDER — ALBUTEROL SULFATE HFA 108 (90 BASE) MCG/ACT IN AERS: 2.0000 | INHALATION_SPRAY | Freq: Four times a day (QID) | RESPIRATORY_TRACT | Status: DC | PRN

## 2012-04-15 NOTE — Progress Notes (Signed)
Subjective:  Tiffany [name] is a 66 y.o. female who presents with 4 day hx/o bad cold.   She reports neck soreness, nausea, dizziness, chest congestion, coughing, cough is productive, chills, some sore throat.  Denies ear pain, no fever.  Using Claritin OTC but ran out of this.  Used robitussin yesterday and Delsym for cough.   She is a smoker, but denies diagnosis of COPD, no prior inhaler use.  No sick contacts.  Last illness was bronchitis in September.  Denies prior pneumonia or pneumococcal vaccines.  No other aggravating or relieving factors.  No other c/o.  The following portions of the patient's history were reviewed and updated as appropriate: allergies, current medications, past family history, past medical history, past social history, past surgical history and problem list.  Past Medical History  Diagnosis Date  . Hyperlipidemia   . Tobacco abuse     Objective:   Filed Vitals:   04/15/12 1048  BP: 132/78  Pulse: 100  Temp: 101.2 F (38.4 C)  Resp: 16    General appearance: Alert, WD/WN, no distress, ill appearing                             Skin: warm, no rash, no diaphoresis                           Head: no sinus tenderness                            Eyes: conjunctiva normal, corneas clear, PERRLA                            Ears: left TM with erythema, right TM pearly, external ear canals normal                          Nose: septum midline, turbinates swollen, with erythema and clear discharge             Mouth/throat: MMM, tongue normal, mild pharyngeal erythema                           Neck: supple, no adenopathy, no thyromegaly, nontender                          Heart: RRR, normal S1, S2, no murmurs                         Lungs: +bronchial breath sounds, decreased breath sounds, +scattered rhonchi, +faint wheezes, no rales                Extremities: no edema, nontender     Assessment and Plan:   Encounter Diagnoses  Name Primary?  . Fever Yes  . Cough   .  Shortness of breath   . Tobacco use disorder    CXR - hyperinflation, hilar congestion suggestive of bronchitis, but symptoms and exam suggest early pneumonia.  Prescription given today for Levaquin and albuterol as below.  Recommended Mucinex DM OTC.  Call/return in 2-3 days if symptoms are worse or not improving.  Recheck 1wk.

## 2012-04-15 NOTE — Patient Instructions (Signed)
Begin Levaquin antibiotic, once daily for 7 days.  REST, drink plenty of water.  Begin Albuterol inhaler, 2 puffs every 4-6 hours as needed for shortness of breath or wheezing.  Continue Delsym for cough or try OTC Mucinex DM.  If worse or not improving by Friday, call or return. Otherwise return as scheduled in June.

## 2012-04-23 ENCOUNTER — Telehealth: Payer: Self-pay | Admitting: Family Medicine

## 2012-04-23 NOTE — Telephone Encounter (Signed)
Patient states that she is doing a lot better. CLS Patient states that she has an appointment on June 3rd. CLS   Patient was notified of her CXR report. CLS

## 2012-04-23 NOTE — Telephone Encounter (Signed)
Message copied by Janeice Robinson on Wed Apr 23, 2012  1:27 PM ------      Message from: Aleen Campi, DAVID S      Created: Wed Apr 23, 2012  1:11 PM       CXR from radiology overread did say pneumonia.  I put her on Levaquin for pneumonia.  See how she is doing?              I advised we recheck her back in office in 1wk to make sure this resolves.

## 2012-04-28 ENCOUNTER — Encounter: Payer: Self-pay | Admitting: Family Medicine

## 2012-04-28 ENCOUNTER — Ambulatory Visit (INDEPENDENT_AMBULATORY_CARE_PROVIDER_SITE_OTHER): Payer: BC Managed Care – PPO | Admitting: Family Medicine

## 2012-04-28 VITALS — BP 110/72 | HR 80 | Ht 67.0 in | Wt 155.0 lb

## 2012-04-28 DIAGNOSIS — Z79899 Other long term (current) drug therapy: Secondary | ICD-10-CM

## 2012-04-28 DIAGNOSIS — R059 Cough, unspecified: Secondary | ICD-10-CM

## 2012-04-28 DIAGNOSIS — E559 Vitamin D deficiency, unspecified: Secondary | ICD-10-CM | POA: Insufficient documentation

## 2012-04-28 DIAGNOSIS — E7800 Pure hypercholesterolemia, unspecified: Secondary | ICD-10-CM | POA: Insufficient documentation

## 2012-04-28 DIAGNOSIS — F17200 Nicotine dependence, unspecified, uncomplicated: Secondary | ICD-10-CM

## 2012-04-28 DIAGNOSIS — R05 Cough: Secondary | ICD-10-CM

## 2012-04-28 NOTE — Patient Instructions (Signed)
Please try and come up with a routine to take your medications every day, on a regular schedule.  Consider using a pill box to help you.  Please try and quit smoking--remember about 1-800-QUITNOW or [facility]Quit[facility] for free counseling, as well as free smoking cessation classes at the [facility].  Return for fasting labs this week.  I expect that your cholesterol will be high, given that you haven't been taking the medication every day.

## 2012-04-28 NOTE — Progress Notes (Signed)
Chief Complaint  Patient presents with  . med check    med check for simvastatin-pt NON fasting.   HPI:  Tiffany [name] 2 weeks ago, diagnosed with pneumonia and treated with Levaquin.  She completed the antibiotics.  No longer needing inhaler, no further fevers or pleuritic chest pain.  She continues to smoke, although states she cut back--now smoking 1 PPD.  Still has a cough, nonproductive, intermittent.  Denies shortness of breath.  Hyperlipidemia follow-up:  She reports that she forgets to take her medicine frequently, only remembering to take it about twice a week.  Tries to watch her diet--has red meat once or twice a week, rare eggs. Denies side effects to medications.  H/o Vitamin D deficiency--taking Caltrate once daily, along with a MVI.  Has Vitamin D supplement at home, but not taking.  Past Medical History  Diagnosis Date  . Hyperlipidemia   . Tobacco abuse    Past Surgical History  Procedure Date  . Bilateral salpingoophorectomy 2007    cancerous tumor R ovary  . Vaginal hysterectomy late 20's  . Tonsillectomy age 26  . Cataract extraction     bilateral    Current Outpatient Prescriptions on File Prior to Visit  Medication Sig Dispense Refill  . albuterol (PROVENTIL HFA;VENTOLIN HFA) 108 (90 BASE) MCG/ACT inhaler Inhale 2 puffs into the lungs every 6 (six) hours as needed for wheezing.  1 Inhaler  0  . Calcium-Vitamin D (CALTRATE 600 PLUS-VIT D PO) Take 1 each by mouth daily.      . Multiple Vitamins-Minerals (HAIR/SKIN/NAILS PO) Take 1 tablet by mouth daily.      . simvastatin (ZOCOR) 20 MG tablet Take 20 mg by mouth every evening.       No Known Allergies  ROS:  Denies fevers, shortness of breath, chest pain. Had nausea and vomiting 2 days ago (while cleaning her house)--no problems since then.  Denies diarrhea.  Denies skin rashes.   PHYSICAL EXAM: BP 110/72  Pulse 80  Ht 5\' 7"  (1.702 m)  Wt 155 lb (70.308 kg)  BMI 24.28 kg/m2  Well developed, pleasant  elderly female, in no distress.  Intermittent dry cough. Appears older than stated age, smells of smoke. OP: dentures, no lesions visible, moist mucus membrane Neck: no lymphadenopathy Heart: regular rate and rhythm without murmur Lungs: clear bilaterally with fair air movement Abdomen: soft, nontender, no organomegaly or mass Extremities: 2+ pulse, no edema Skin: very dry Psych: normal mood, affect, hygiene and grooming.  ASSESSMENT/PLAN: 1. Current smoker    2. Pure hypercholesterolemia  Lipid panel, Comprehensive metabolic panel  3. Unspecified vitamin D deficiency  Vitamin D 25 hydroxy  4. Encounter for long-term (current) use of other medications  Comprehensive metabolic panel  5. Cough  CBC with Differential   Recent pneumonia--clinically resolved. No need to repeat CXR, given that she had a fairly normal one just prior (no cancer) to last one done in office.  If lipids not at goal, it is because she isn't taking medications properly.  Needs to take daily.  Hasn't had chemistry or vitamin D checked in a while.  Recent CBC shows high WBC, when ill with pneumonia--will recheck.  Will need simvastatin Rx after labs reviewed  Strongly encouraged tobacco cessation--risks of smoking reviewed again, counseled extensively.

## 2012-04-30 ENCOUNTER — Other Ambulatory Visit: Payer: BC Managed Care – PPO

## 2012-04-30 DIAGNOSIS — E7800 Pure hypercholesterolemia, unspecified: Secondary | ICD-10-CM

## 2012-04-30 DIAGNOSIS — R059 Cough, unspecified: Secondary | ICD-10-CM

## 2012-04-30 DIAGNOSIS — Z79899 Other long term (current) drug therapy: Secondary | ICD-10-CM

## 2012-04-30 DIAGNOSIS — E559 Vitamin D deficiency, unspecified: Secondary | ICD-10-CM

## 2012-04-30 DIAGNOSIS — R05 Cough: Secondary | ICD-10-CM

## 2012-04-30 LAB — CBC WITH DIFFERENTIAL/PLATELET
Basophils Absolute: 0.1 10*3/uL (ref 0.0–0.1)
HCT: 39.5 % (ref 36.0–46.0)
Lymphocytes Relative: 28 % (ref 12–46)
Lymphs Abs: 1.6 10*3/uL (ref 0.7–4.0)
MCHC: 34.4 g/dL (ref 30.0–36.0)
MCV: 93.6 fL (ref 78.0–100.0)
Neutro Abs: 3.4 10*3/uL (ref 1.7–7.7)
RDW: 13.4 % (ref 11.5–15.5)
WBC: 5.6 10*3/uL (ref 4.0–10.5)

## 2012-04-30 LAB — COMPREHENSIVE METABOLIC PANEL
BUN: 13 mg/dL (ref 6–23)
CO2: 27 mEq/L (ref 19–32)
Calcium: 9.2 mg/dL (ref 8.4–10.5)
Chloride: 101 mEq/L (ref 96–112)
Creat: 0.69 mg/dL (ref 0.50–1.10)
Total Bilirubin: 0.5 mg/dL (ref 0.3–1.2)

## 2012-04-30 LAB — LIPID PANEL
HDL: 37 mg/dL — ABNORMAL LOW (ref >39)
LDL Cholesterol: 102 mg/dL — ABNORMAL HIGH (ref 0–99)
Total CHOL/HDL Ratio: 4.5 Ratio

## 2012-05-01 ENCOUNTER — Encounter: Payer: Self-pay | Admitting: Family Medicine

## 2012-05-01 LAB — VITAMIN D 25 HYDROXY (VIT D DEFICIENCY, FRACTURES): Vit D, 25-Hydroxy: 39 ng/mL (ref 30–89)

## 2012-05-12 ENCOUNTER — Encounter: Payer: Self-pay | Admitting: Family Medicine

## 2012-05-12 ENCOUNTER — Encounter: Payer: Self-pay | Admitting: *Deleted

## 2012-05-12 DIAGNOSIS — M8580 Other specified disorders of bone density and structure, unspecified site: Secondary | ICD-10-CM | POA: Insufficient documentation

## 2012-07-25 ENCOUNTER — Other Ambulatory Visit: Payer: Self-pay | Admitting: Family Medicine

## 2012-07-25 DIAGNOSIS — Z1231 Encounter for screening mammogram for malignant neoplasm of breast: Secondary | ICD-10-CM

## 2012-08-08 ENCOUNTER — Ambulatory Visit
Admission: RE | Admit: 2012-08-08 | Discharge: 2012-08-08 | Disposition: A | Discharge status: Home / Self Care | Payer: Medicare Other | Source: Ambulatory Visit | Attending: Family Medicine | Admitting: Family Medicine

## 2012-08-08 DIAGNOSIS — Z1231 Encounter for screening mammogram for malignant neoplasm of breast: Secondary | ICD-10-CM

## 2012-10-16 ENCOUNTER — Encounter: Payer: Self-pay | Admitting: Family Medicine

## 2012-10-16 ENCOUNTER — Ambulatory Visit (INDEPENDENT_AMBULATORY_CARE_PROVIDER_SITE_OTHER): Payer: Medicare Other | Admitting: Family Medicine

## 2012-10-16 VITALS — BP 128/78 | HR 72 | Ht 67.0 in | Wt 155.0 lb

## 2012-10-16 DIAGNOSIS — E7800 Pure hypercholesterolemia, unspecified: Secondary | ICD-10-CM

## 2012-10-16 DIAGNOSIS — Z23 Encounter for immunization: Secondary | ICD-10-CM

## 2012-10-16 DIAGNOSIS — K5900 Constipation, unspecified: Secondary | ICD-10-CM

## 2012-10-16 MED ORDER — SIMVASTATIN 20 MG PO TABS: 20.0000 mg | ORAL_TABLET | Freq: Every evening | ORAL | Status: DC

## 2012-10-16 MED ORDER — INFLUENZA VIRUS VACC SPLIT PF IM SUSP: 0.5000 mL | Freq: Once | INTRAMUSCULAR | Status: DC

## 2012-10-16 NOTE — Patient Instructions (Addendum)
Start miralax daily.  Drink plenty of water.  Consider Gas-X for pain.  You can also use tylenol for pain.  Go to ER if severe/worsening pain, fevers, vomiting, bloody stools, etc. Miralax can take a few days to work.  Quit smoking. Exercise daily.  Restart simvastatin and return in 2-3 months for fasting labs.  Constipation, Adult Constipation is when a person has fewer than 3 bowel movements a week; has difficulty having a bowel movement; or has stools that are dry, hard, or larger than normal. As people grow older, constipation is more common. If you try to fix constipation with medicines that make you have a bowel movement (laxatives), the problem may get worse. Long-term laxative use may cause the muscles of the colon to become weak. A low-fiber diet, not taking in enough fluids, and taking certain medicines may make constipation worse. CAUSES   Certain medicines, such as antidepressants, pain medicine, iron supplements, antacids, and water pills.   Certain diseases, such as diabetes, irritable bowel syndrome (IBS), thyroid disease, or depression.   Not drinking enough water.   Not eating enough fiber-rich foods.   Stress or travel.  Lack of physical activity or exercise.  Not going to the restroom when there is the urge to have a bowel movement.  Ignoring the urge to have a bowel movement.  Using laxatives too much. SYMPTOMS   Having fewer than 3 bowel movements a week.   Straining to have a bowel movement.   Having hard, dry, or larger than normal stools.   Feeling full or bloated.   Pain in the lower abdomen.  Not feeling relief after having a bowel movement. DIAGNOSIS  Your caregiver will take a medical history and perform a physical exam. Further testing may be done for severe constipation. Some tests may include:   A barium enema X-ray to examine your rectum, colon, and sometimes, your small intestine.  A sigmoidoscopy to examine your lower  colon.  A colonoscopy to examine your entire colon. TREATMENT  Treatment will depend on the severity of your constipation and what is causing it. Some dietary treatments include drinking more fluids and eating more fiber-rich foods. Lifestyle treatments may include regular exercise. If these diet and lifestyle recommendations do not help, your caregiver may recommend taking over-the-counter laxative medicines to help you have bowel movements. Prescription medicines may be prescribed if over-the-counter medicines do not work.  HOME CARE INSTRUCTIONS   Increase dietary fiber in your diet, such as fruits, vegetables, whole grains, and beans. Limit high-fat and processed sugars in your diet, such as [place] fries, hamburgers, cookies, candies, and soda.   A fiber supplement may be added to your diet if you cannot get enough fiber from foods.   Drink enough fluids to keep your urine clear or pale yellow.   Exercise regularly or as directed by your caregiver.   Go to the restroom when you have the urge to go. Do not hold it.  Only take medicines as directed by your caregiver. Do not take other medicines for constipation without talking to your caregiver first. SEEK IMMEDIATE MEDICAL CARE IF:   You have bright red blood in your stool.   Your constipation lasts for more than 4 days or gets worse.   You have abdominal or rectal pain.   You have thin, pencil-like stools.  You have unexplained weight loss. MAKE SURE YOU:   Understand these instructions.  Will watch your condition.  Will get help right away if you  are not doing well or get worse. Document Released: 08/10/2004 Document Revised: 02/04/2012 Document Reviewed: 10/16/2011 [facility] Patient Information 2013 [place], [place].

## 2012-10-16 NOTE — Progress Notes (Signed)
Chief Complaint  Patient presents with  . Constipation    has not been able to have a bowel movement in over a week. Finally had a BM this am, is still having some abdominal pain that starts on her left side and radiates across her stomach to her back.   HPI:  Normally she has a BM after having 2 cups of coffee in the mornings.  After no stool x 2-3 days she increased fiber in her diet and tried milk of magnesia.  She then took Colon Cleanse with 3 bottles of water.  She didn't get any results.  She also tried magnesium citrate which made her vomit.  Yesterday she took a Fleet's enema, had a slight response.  This morning after her coffee she had a small bowel movement.  She has been having abdominal pain, which has subsided some after stool this morning.  Pain starts at [facility] and radiates around to her back. Still having some twinges of pain in her back, but belly is feeling better.  She admits to not drinking as much water in the winter as she does in the summer.  Otherwise denies any changes in diet, fiber, activity.  She doesn't exercise much, but did walk around mall in [place] earlier this week.  Colonoscopy 11/2010 Stool was dark this morning.  Denies blood or mucus in stool.  The emesis did not have coffee grounds.  Hyperlipidemia: She stopped taking the simvastatin when she ran out.  Apparently it had been prescribed by Dr. Zenaida Niece, and we were never contacted for refills.  Reviewed results and letter with patient.  Past Medical History  Diagnosis Date  . Hyperlipidemia   . Tobacco abuse   . Hypertension     previously treated with meds, resolved  . MGUS (monoclonal gammopathy of unknown significance) 1999    prevously under care of Dr. Myrle Sheng  . Osteopenia     DEXA 01/31/2010 at [place]  . Colon polyp 2/05  . 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 BCC (basal cell carcinoma), face 2010    Dr. Terri Piedra  . Diverticulosis 11/2010    seen on colonoscopy and air contrast  BE   Past Surgical History  Procedure Date  . Bilateral salpingoophorectomy 2007    cancerous tumor R ovary  . Vaginal hysterectomy late 20's  . Tonsillectomy age 62  . Cataract extraction 2009    bilateral  . Omentectomy 2007    and pelvic lymphadenectomy (with BSO)  . Basal cell carcinoma excision 2010    L face, R temple  . Colonoscopy 11/2010    Dr. Laural Benes. Diverticulosis. BE also done--normal   History   Social History  . Marital Status: Married    Spouse Name: N/A    Number of Children: 1  . Years of Education: N/A   Occupational History  . retired Chemical engineer)    Social History Main Topics  . Smoking status: Current Every Day Smoker -- 1.5 packs/day for 50 years  . Smokeless tobacco: Never Used  . Alcohol Use: Yes     Comment: maybe 1-2 times per year.  . Drug Use: No  . Sexually Active: Not on file   Other Topics Concern  . Not on file   Social History Narrative   Lives with husband, 1 cat.  Son lives in [place]   Current outpatient prescriptions:Calcium-Vitamin D (CALTRATE 600 PLUS-VIT D PO), Take 1 each by mouth daily., Disp: ,  Rfl: ;  albuterol (PROVENTIL HFA;VENTOLIN HFA) 108 (90 BASE) MCG/ACT inhaler, Inhale 2 puffs into the lungs every 6 (six) hours as needed for wheezing., Disp: 1 Inhaler, Rfl: 0;  Multiple Vitamins-Minerals (HAIR/SKIN/NAILS PO), Take 1 tablet by mouth daily., Disp: , Rfl:  simvastatin (ZOCOR) 20 MG tablet, Take 20 mg by mouth every evening., Disp: , Rfl:  (not taking zocor)  ROS:  Denies fevers, dysuria, skin rash, joint pains, melena, hematochezia, coffee-ground emesis.  +constipation and abdominal pain.  Denies URI symptoms.  Some mild smoker's cough--no shortness o breath, purulent phlegm.  Denies chest pain, headaches, dizziness.  PHYSICAL EXAM: BP 128/78  Pulse 72  Ht 5\' 7"  (1.702 m)  Wt 155 lb (70.308 kg)  BMI 24.28 kg/m2 Well developed, pleasant female in no distress.  Accompanied by her husband Neck: no  lymphadenopathy, thyromegaly or mass Heart: regular rate and rhythm without murmur Lungs: clear bilaterally Abdomen: Active bowel sounds, not high pitched.  Felt pockets of gas move around on exam.  Mildly tender across lower abdomen. No rebound/guarding/mass.  No organomegaly or mass.  Negative Murphy sign. Back: no spine or CVA tenderness.  No muscle spasm.  Area of pain is right lower back, nontender Neuro: alert and oriented.  Normal gait, strength Psych: normal mood, affect, hygiene and grooming.  ASSESSMENT/PLAN: 1. Constipation    2. Pure hypercholesterolemia  simvastatin (ZOCOR) 20 MG tablet, Lipid panel, Hepatic function panel  3. Need for prophylactic vaccination and inoculation against influenza  influenza  inactive virus vaccine (FLUZONE/FLUARIX) injection 0.5 mL   Constipation--high fiber diet, increase fluid intake. Discussed repeating Fleet's enema tonight if needed, versus starting on Miralax daily, recognizing that it may take a few days for results.  Can back down at dose and stop completely when bowels return to normal.  Back pain likely related to gas/bowels.  F/u if worsening abdominal pain, fevers, bloody stools, or other concerns  Lipids--restart simvastatin.  Return in 2 months for labs. Encouraged to get daily exercise and quit smoking.

## 2012-12-03 ENCOUNTER — Encounter: Payer: Self-pay | Admitting: Internal Medicine

## 2012-12-16 ENCOUNTER — Other Ambulatory Visit: Payer: Medicare Other

## 2012-12-16 DIAGNOSIS — E7800 Pure hypercholesterolemia, unspecified: Secondary | ICD-10-CM

## 2012-12-16 LAB — LIPID PANEL
HDL: 43 mg/dL (ref >39)
LDL Cholesterol: 66 mg/dL (ref 0–99)
Total CHOL/HDL Ratio: 3.1 Ratio
Triglycerides: 122 mg/dL (ref <150)

## 2012-12-16 LAB — HEPATIC FUNCTION PANEL
Albumin: 4 g/dL (ref 3.5–5.2)
Alkaline Phosphatase: 99 U/L (ref 39–117)
Total Protein: 7.2 g/dL (ref 6.0–8.3)

## 2012-12-22 ENCOUNTER — Other Ambulatory Visit: Payer: Self-pay | Admitting: *Deleted

## 2012-12-22 DIAGNOSIS — E7800 Pure hypercholesterolemia, unspecified: Secondary | ICD-10-CM

## 2012-12-22 MED ORDER — SIMVASTATIN 20 MG PO TABS: 20.0000 mg | ORAL_TABLET | Freq: Every evening | ORAL | Status: DC

## 2013-06-22 ENCOUNTER — Encounter: Payer: Self-pay | Admitting: Family Medicine

## 2013-06-22 ENCOUNTER — Ambulatory Visit (INDEPENDENT_AMBULATORY_CARE_PROVIDER_SITE_OTHER): Payer: Medicare Other | Admitting: Family Medicine

## 2013-06-22 VITALS — BP 124/72 | HR 80 | Ht 67.0 in | Wt 153.0 lb

## 2013-06-22 DIAGNOSIS — E559 Vitamin D deficiency, unspecified: Secondary | ICD-10-CM

## 2013-06-22 DIAGNOSIS — E7800 Pure hypercholesterolemia, unspecified: Secondary | ICD-10-CM

## 2013-06-22 DIAGNOSIS — M899 Disorder of bone, unspecified: Secondary | ICD-10-CM

## 2013-06-22 DIAGNOSIS — F17200 Nicotine dependence, unspecified, uncomplicated: Secondary | ICD-10-CM

## 2013-06-22 DIAGNOSIS — M8580 Other specified disorders of bone density and structure, unspecified site: Secondary | ICD-10-CM

## 2013-06-22 DIAGNOSIS — Z0000 Encounter for general adult medical examination without abnormal findings: Secondary | ICD-10-CM

## 2013-06-22 LAB — CBC WITH DIFFERENTIAL/PLATELET
Basophils Absolute: 0.1 10*3/uL (ref 0.0–0.1)
Basophils Relative: 1 % (ref 0–1)
MCHC: 35.4 g/dL (ref 30.0–36.0)
Monocytes Absolute: 0.5 10*3/uL (ref 0.1–1.0)
Neutro Abs: 2.9 10*3/uL (ref 1.7–7.7)
Neutrophils Relative %: 51 % (ref 43–77)
RDW: 13.5 % (ref 11.5–15.5)

## 2013-06-22 LAB — POCT URINALYSIS DIPSTICK
Protein, UA: NEGATIVE
Spec Grav, UA: 1.01
Urobilinogen, UA: NEGATIVE

## 2013-06-22 MED ORDER — SIMVASTATIN 20 MG PO TABS: 20.0000 mg | ORAL_TABLET | Freq: Every evening | ORAL | Status: DC

## 2013-06-22 NOTE — Patient Instructions (Addendum)
HEALTH MAINTENANCE RECOMMENDATIONS:  It is recommended that you get at least 30 minutes of aerobic exercise at least 5 days/week (for weight loss, you may need as much as 60-90 minutes). This can be any activity that gets your heart rate up. This can be divided in 10-15 minute intervals if needed, but try and build up your endurance at least once a week.  Weight bearing exercise is also recommended twice weekly.  Eat a healthy diet with lots of vegetables, fruits and fiber.  "Colorful" foods have a lot of vitamins (ie green vegetables, tomatoes, red peppers, etc).  Limit sweet tea, regular sodas and alcoholic beverages, all of which has a lot of calories and sugar.  Up to 1 alcoholic drink daily may be beneficial for women (unless trying to lose weight, watch sugars).  Drink a lot of water.  Calcium recommendations are 1200-1500 mg daily (1500 mg for postmenopausal women or women without ovaries), and vitamin D 1000 IU daily.  This should be obtained from diet and/or supplements (vitamins), and calcium should not be taken all at once, but in divided doses.  Monthly self breast exams and yearly mammograms for women over the age of 48 is recommended.  Sunscreen of at least SPF 30 should be used on all sun-exposed parts of the skin when outside between the hours of 10 am and 4 pm (not just when at beach or pool, but even with exercise, golf, tennis, and yard work!)  Use a sunscreen that says "broad spectrum" so it covers both UVA and UVB rays, and make sure to reapply every 1-2 hours.  Remember to change the batteries in your smoke detectors when changing your clock times in the spring and fall.  Use your seat belt every time you are in a car, and please drive safely and not be distracted with cell phones and texting while driving.  Check into insurance coverage for shingles vaccine (might need to get at pharmacy)  Please try and quit smoking--start thinking about why/when you smoke (habit, boredom,  stress) in order to come up with effective strategies to cut back or quit. Available resources to help you quit include free counseling through [facility] ([facility]Quitline.com or 1-800-QUITNOW), smoking cessation classes through [facility] (call to find out schedule), over-the-counter nicotine replacements, and e-cigarettes (although this may not help break the hand-mouth habit).  Many insurance companies also have smoking cessation programs (which may decrease the cost of patches, meds if enrolled).  If these methods are not effective for you, and you are motivated to quit, return to discuss the possibility of prescription medications.  I recommend that you see dentist for oral cancer screening exam  Continue antibacterial ointment to right knee.  Return if having fevers, increasing/spreading redness, streaks, yellow crusting at the wound

## 2013-06-22 NOTE — Progress Notes (Signed)
Chief Complaint  Patient presents with  . Annual Exam    fasting annual exam/med check (states she doesn't do GYN care any longer). Fell at [facility] 06/18/13 just wanted to know if you could take a look at her right knee to make sure its healing okay. UA showed trace blood-patient is asymptomatic.    Tiffany [name] is a 67 y.o. female who presents for a complete physical.  She has the following concerns:  Tripped over her flip flops while walking over curb in parking lot at [employer].  Landed on R knee, scabbed up.  Denies any knee pain or swelling, wants wound checked.  Hyperlipidemia follow-up:  Patient is reportedly following a low-fat, low cholesterol diet.  Compliant with medications and denies medication side effects  Osteopenia:  She takes a calcium +D supplement daily.  She continues to smoke, and doesn't get any regular weight-bearing exercise.  Last DEXA was in 2011.  Tobacco abuse:  She admits to not being ready to quit.  She has shortness of breath when going up and down the stairs to do her laundry.  Otherwise, has limited exercise, and therefore no significant shortness of breath.  Only uses albuterol when doing laundry/stairs.  Immunization History  Administered Date(s) Administered  . Influenza Split 10/16/2012  . Pneumococcal Polysaccharide 12/12/2009  . Tdap 12/12/2009   Last Pap smear: s/p hyst in 20's Last mammogram: 07/2012 Last colonoscopy: 11/2010 Dr. Laural Benes Last DEXA: 01/2010 (osteopenia) Dentist: doesn't go, has dentures Ophtho: every 2 years, went last year Exercise:  Walks around [place] for shopping  Past Medical History  Diagnosis Date  . Hyperlipidemia   . Tobacco abuse   . Hypertension     previously treated with meds, resolved  . MGUS (monoclonal gammopathy of unknown significance) 1999    prevously under care of Dr. Myrle Sheng  . Osteopenia     DEXA 01/31/2010 at [place]  . Colon polyp 2/05  . 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  BCC (basal cell carcinoma), face 2010    Dr. Terri Piedra  . Diverticulosis 11/2010    seen on colonoscopy and air contrast BE    Past Surgical History  Procedure Laterality Date  . Bilateral salpingoophorectomy  2007    cancerous tumor R ovary  . Vaginal hysterectomy  late 20's  . Tonsillectomy  age 67  . Cataract extraction  2009    bilateral  . Omentectomy  2007    and pelvic lymphadenectomy (with BSO)  . Basal cell carcinoma excision  2010    L face, R temple  . Colonoscopy  11/2010    Dr. Laural Benes. Diverticulosis. BE also done--normal    History   Social History  . Marital Status: Married    Spouse Name: N/A    Number of Children: 1  . Years of Education: N/A   Occupational History  . retired Chemical engineer)    Social History Main Topics  . Smoking status: Current Every Day Smoker -- 1.50 packs/day for 50 years  . Smokeless tobacco: Never Used  . Alcohol Use: Yes     Comment: maybe 1-2 times per year.  . Drug Use: No  . Sexually Active: Not on file   Other Topics Concern  . Not on file   Social History Narrative   Lives with husband, 1 cat.  Son lives in [place]    Family History  Problem Relation Age of Onset  . Stroke Mother   .  Hypertension Father   . Diabetes Maternal Aunt   . Cancer Maternal Uncle     throat cancer, lung cancer (smoker)  . Diabetes Maternal Aunt   . Diabetes Maternal Aunt   . Cancer Maternal Aunt     breast cancer in 2 aunts    Current outpatient prescriptions:albuterol (PROVENTIL HFA;VENTOLIN HFA) 108 (90 BASE) MCG/ACT inhaler, Inhale 2 puffs into the lungs every 6 (six) hours as needed for wheezing., Disp: 1 Inhaler, Rfl: 0;  Calcium-Vitamin D (CALTRATE 600 PLUS-VIT D PO), Take 1 each by mouth daily., Disp: , Rfl: ;  Multiple Vitamins-Minerals (HAIR/SKIN/NAILS PO), Take 1 tablet by mouth daily., Disp: , Rfl:  simvastatin (ZOCOR) 20 MG tablet, Take 1 tablet (20 mg total) by mouth every evening., Disp: 30 tablet, Rfl: 5  No  Known Allergies  ROS:  The patient denies anorexia, fever, weight changes, headaches,  vision changes, decreased hearing, ear pain, sore throat, breast concerns, chest pain, palpitations, dizziness, syncope, swelling, nausea, vomiting, diarrhea, constipation, abdominal pain, melena, hematochezia, indigestion/heartburn, hematuria, incontinence, dysuria, vaginal bleeding, discharge, odor or itch, genital lesions, joint pains, numbness, tingling, weakness, tremor, suspicious skin lesions, depression, anxiety, abnormal bleeding/bruising, or enlarged lymph nodes. Occasional night sweats, no hot flashes during the day Some vaginal dryness SOB only with stairs, that's the only time she needs to use albuterol  PHYSICAL EXAM: BP 124/72  Pulse 80  Ht 5\' 7"  (1.702 m)  Wt 153 lb (69.4 kg)  BMI 23.96 kg/m2  General Appearance:    Alert, cooperative, no distress, appears stated age  Head:    Normocephalic, without obvious abnormality, atraumatic  Eyes:    PERRL, conjunctiva/corneas clear, EOM's intact, fundi    benign  Ears:    Normal TM's and external ear canals  Nose:   Nares normal, mucosa normal, no drainage or sinus   tenderness  Throat:   Lips, mucosa, and tongue normal; upper and lower dentures  Neck:   Supple, no lymphadenopathy;  thyroid:  no   enlargement/tenderness/nodules; no carotid   bruit or JVD  Back:    Spine nontender, no curvature, ROM normal, no CVA     tenderness  Lungs:     Clear to auscultation bilaterally without wheezes, rales or     ronchi; respirations unlabored  Chest Wall:    No tenderness or deformity   Heart:    Regular rate and rhythm, S1 and S2 normal, no murmur, rub   or gallop  Breast Exam:    No tenderness, masses, or nipple discharge or inversion.      No axillary lymphadenopathy  Abdomen:     Soft, non-tender, nondistended, normoactive bowel sounds,    no masses, no hepatosplenomegaly  Genitalia:    Normal external genitalia without lesions.  BUS and vagina  normal, some atrophic changes; No abnormal vaginal discharge.  Uterus absent.  No adnexal masses or tenderness.  Pap not performed  Rectal:    Normal tone, no masses or tenderness; guaiac negative stool  Extremities:   No clubbing, cyanosis or edema  Pulses:   2+ and symmetric all extremities  Skin:   Skin color, texture, turgor normal, no rashes or lesions. Abrasion R knee without any exudate, surrounding erythema, warmth, streaks; no crusting; no evidence of infection.  Lymph nodes:   Cervical, supraclavicular, and axillary nodes normal  Neurologic:   CNII-XII intact, normal strength, sensation and gait; reflexes 2+ and symmetric throughout          Psych:   Normal  mood, affect, hygiene and grooming.    ASSESSMENT/PLAN:  Routine general medical examination at a health care facility - Plan: POCT Urinalysis Dipstick, Visual acuity screening, CBC with Differential, TSH  Current smoker - reviewed risks; counseled re: available help (counseling, meds).  counseled re: low dose CT as screen for CA. encouraged cessation - Plan: CANCELED: CT CHEST LOW DOSE CANCER SCREENING EXAM  Osteopenia - discussed calcium + D recommendations; weight-bearing exercise.  Quit smoking.  Repeat DEXA.  - Plan: TSH  Pure hypercholesterolemia - Plan: Lipid panel, Comprehensive metabolic panel, simvastatin (ZOCOR) 20 MG tablet  Unspecified vitamin D deficiency - adequately replaced with current supplementation per labs last year; on same supplements  Discussed monthly self breast exams and yearly mammograms after the age of 44; at least 30 minutes of aerobic activity at least 5 days/week; proper sunscreen use reviewed; healthy diet, including goals of calcium and vitamin D intake and alcohol recommendations (less than or equal to 1 drink/day) reviewed; regular seatbelt use; changing batteries in smoke detectors.  Immunization recommendations discussed--see below.  Colonoscopy recommendations reviewed, UTD  Recommended  seeing dentist for oral cancer screening exam; Due for ophtho exam.  Pneumovax in 11/2014 (to be 5 years from the last one). Flu shots yearly Check into insurance coverage for shingles vaccine (might need to get at pharmacy) Risks and benefits reviewed in detail  Continue antibacterial ointment to right knee

## 2013-06-23 LAB — COMPREHENSIVE METABOLIC PANEL
ALT: 14 U/L (ref 0–35)
AST: 21 U/L (ref 0–37)
Albumin: 4.3 g/dL (ref 3.5–5.2)
Calcium: 9.8 mg/dL (ref 8.4–10.5)
Chloride: 102 mEq/L (ref 96–112)
Potassium: 4.4 mEq/L (ref 3.5–5.3)

## 2013-06-23 LAB — LIPID PANEL
LDL Cholesterol: 91 mg/dL (ref 0–99)
VLDL: 24 mg/dL (ref 0–40)

## 2013-06-23 LAB — TSH: TSH: 3.005 u[IU]/mL (ref 0.350–4.500)

## 2013-07-17 ENCOUNTER — Telehealth: Payer: Self-pay | Admitting: Family Medicine

## 2013-07-17 DIAGNOSIS — E7800 Pure hypercholesterolemia, unspecified: Secondary | ICD-10-CM

## 2013-07-17 MED ORDER — SIMVASTATIN 20 MG PO TABS: 20.0000 mg | ORAL_TABLET | Freq: Every evening | ORAL | Status: DC

## 2013-07-17 NOTE — Telephone Encounter (Signed)
SENT IN 90 DAY WITH 1 REFILL

## 2013-07-29 ENCOUNTER — Other Ambulatory Visit: Payer: Self-pay

## 2013-07-29 ENCOUNTER — Other Ambulatory Visit: Payer: Self-pay | Admitting: Family Medicine

## 2013-07-29 DIAGNOSIS — Z1231 Encounter for screening mammogram for malignant neoplasm of breast: Secondary | ICD-10-CM

## 2013-08-27 ENCOUNTER — Ambulatory Visit
Admission: RE | Admit: 2013-08-27 | Discharge: 2013-08-27 | Disposition: A | Discharge status: Home / Self Care | Payer: Medicare Other | Source: Ambulatory Visit

## 2013-08-27 ENCOUNTER — Ambulatory Visit
Admission: RE | Admit: 2013-08-27 | Discharge: 2013-08-27 | Disposition: A | Discharge status: Home / Self Care | Payer: Medicare Other | Source: Ambulatory Visit | Attending: Family Medicine | Admitting: Family Medicine

## 2013-08-27 DIAGNOSIS — M8580 Other specified disorders of bone density and structure, unspecified site: Secondary | ICD-10-CM

## 2013-08-27 DIAGNOSIS — Z1231 Encounter for screening mammogram for malignant neoplasm of breast: Secondary | ICD-10-CM

## 2013-08-27 LAB — HM DEXA SCAN

## 2013-09-02 ENCOUNTER — Encounter: Payer: Self-pay | Admitting: Family Medicine

## 2013-09-02 ENCOUNTER — Ambulatory Visit (INDEPENDENT_AMBULATORY_CARE_PROVIDER_SITE_OTHER): Payer: Medicare Other | Admitting: Family Medicine

## 2013-09-02 VITALS — BP 126/80 | HR 76 | Ht 67.0 in | Wt 159.0 lb

## 2013-09-02 DIAGNOSIS — M8580 Other specified disorders of bone density and structure, unspecified site: Secondary | ICD-10-CM

## 2013-09-02 DIAGNOSIS — Z23 Encounter for immunization: Secondary | ICD-10-CM

## 2013-09-02 DIAGNOSIS — F17200 Nicotine dependence, unspecified, uncomplicated: Secondary | ICD-10-CM

## 2013-09-02 DIAGNOSIS — E7800 Pure hypercholesterolemia, unspecified: Secondary | ICD-10-CM

## 2013-09-02 DIAGNOSIS — M899 Disorder of bone, unspecified: Secondary | ICD-10-CM

## 2013-09-02 MED ORDER — ALENDRONATE SODIUM 70 MG PO TABS: 70.0000 mg | ORAL_TABLET | ORAL | Status: DC

## 2013-09-02 NOTE — Patient Instructions (Signed)
Take the generic fosamax as described--take on an empty stomach with a full glass of water.  Do not eat or lie down for at least 30 minutes.  Please call if you develop any chest pain, trouble swallowing, or any other side effects.  Remember that if you have mild side effects (ie indigestion) that lasts for a day or two or three, you might do better by taking a once-monthly medication rather than once weekly.  If you do not tolerate this medication at all, please call to consider changing it to the Evista (daily pill, in an entirely different class, related to estrogen) as we discussed at your visit.  Continue weight-bearing exercise, and your calcium and vitamin D supplements.  Please try and work very hard on quitting smoking, as this is known to weaken your bones (amongst all the other bad things that we have discussed in the past that smoking causes!).  Please try and quit smoking--start thinking about why/when you smoke (habit, boredom, stress) in order to come up with effective strategies to cut back or quit. Available resources to help you quit include free counseling through [facility] Quitline ([facility]Quitline.com or 1-800-QUITNOW), smoking cessation classes through [facility] (call to find out schedule), over-the-counter nicotine replacements, and e-cigarettes (although this may not help break the hand-mouth habit).  Many insurance companies also have smoking cessation programs (which may decrease the cost of patches, meds if enrolled).  If these methods are not effective for you, and you are motivated to quit, return to discuss the possibility of prescription medications.  Osteoporosis Throughout your life, your body breaks down old bone and replaces it with new bone. As you get older, your body does not replace bone as quickly as it breaks it down. By the age of 30 years, most people begin to gradually lose bone because of the imbalance between bone loss and replacement. Some people  lose more bone than others. Bone loss beyond a specified normal degree is considered osteoporosis.  Osteoporosis affects the strength and durability of your bones. The inside of the ends of your bones and your flat bones, like the bones of your pelvis, look like honeycomb, filled with tiny open spaces. As bone loss occurs, your bones become less dense. This means that the open spaces inside your bones become bigger and the walls between these spaces become thinner. This makes your bones weaker. Bones of a person with osteoporosis can become so weak that they can break (fracture) during minor accidents, such as a simple fall. CAUSES  The following factors have been associated with the development of osteoporosis:  Smoking.  Drinking more than 2 alcoholic drinks several days per week.  Long-term use of certain medicines:  Corticosteroids.  Chemotherapy medicines.  Thyroid medicines.  Antiepileptic medicines.  Gonadal hormone suppression medicine.  Immunosuppression medicine.  Being underweight.  Lack of physical activity.  Lack of exposure to the sun. This can lead to vitamin D deficiency.  Certain medical conditions:  Certain inflammatory bowel diseases, such as Crohn's disease and ulcerative colitis.  Diabetes.  Hyperthyroidism.  Hyperparathyroidism. RISK FACTORS Anyone can develop osteoporosis. However, the following factors can increase your risk of developing osteoporosis:  Gender Women are at higher risk than men.  Age Being older than 50 years increases your risk.  Ethnicity White and Asian people have an increased risk.  Weight Being extremely underweight can increase your risk of osteoporosis.  Family history of osteoporosis Having a family member who has developed osteoporosis  can increase your risk. SYMPTOMS  Usually, people with osteoporosis have no symptoms.  DIAGNOSIS  Signs during a physical exam that may prompt your caregiver to suspect osteoporosis  include:  Decreased height. This is usually caused by the compression of the bones that form your spine (vertebrae) because they have weakened and become fractured.  A curving or rounding of the upper back (kyphosis). To confirm signs of osteoporosis, your caregiver may request a procedure that uses 2 low-dose X-ray beams with different levels of energy to measure your bone mineral density (dual-energy X-ray absorptiometry [DXA]). Also, your caregiver may check your level of vitamin D. TREATMENT  The goal of osteoporosis treatment is to strengthen bones in order to decrease the risk of bone fractures. There are different types of medicines available to help achieve this goal. Some of these medicines work by slowing the processes of bone loss. Some medicines work by increasing bone density. Treatment also involves making sure that your levels of calcium and vitamin D are adequate. PREVENTION  There are things you can do to help prevent osteoporosis. Adequate intake of calcium and vitamin D can help you achieve optimal bone mineral density. Regular exercise can also help, especially resistance and weight-bearing activities. If you smoke, quitting smoking is an important part of osteoporosis prevention. MAKE SURE YOU:  Understand these instructions.  Will watch your condition.  Will get help right away if you are not doing well or get worse. Document Released: 08/22/2005 Document Revised: 10/29/2012 Document Reviewed: 10/27/2011 [facility] Patient Information 2014 [place], [place].

## 2013-09-02 NOTE — Progress Notes (Signed)
Chief Complaint  Patient presents with  . Follow-up    discuss DEXA results. HD Fluzone given today.   Patient presents to discuss DEXA results, and review medical treatment options.  She continues to smoke. Denies dysphagia or other contraindications for bisphosphonates. She has no h/o DVT or other contraindication to Evista either.  She has never taken any medications in the past for her osteopenia, but recalls speaking with Dr. Daphine Deutscher about bisphosphonates in the past (likely in 2002 after last DEXA; never started).  Patient is also asking to review her lab results from July.  Past Medical History  Diagnosis Date  . Hyperlipidemia   . Tobacco abuse   . Hypertension     previously treated with meds, resolved  . MGUS (monoclonal gammopathy of unknown significance) 1999    prevously under care of Dr. Myrle Sheng  . Osteopenia     DEXA 01/31/2010 at [facility]  . Colon polyp 2/05  . 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 BCC (basal cell carcinoma), face 2010    Dr. Terri Piedra  . Diverticulosis 11/2010    seen on colonoscopy and air contrast BE   Past Surgical History  Procedure Laterality Date  . Bilateral salpingoophorectomy  2007    cancerous tumor R ovary  . Vaginal hysterectomy  late 20's  . Tonsillectomy  age 27  . Cataract extraction  2009    bilateral  . Omentectomy  2007    and pelvic lymphadenectomy (with BSO)  . Basal cell carcinoma excision  2010    L face, R temple  . Colonoscopy  11/2010    Dr. Laural Benes. Diverticulosis. BE also done--normal   History   Social History  . Marital Status: Married    Spouse Name: N/A    Number of Children: 1  . Years of Education: N/A   Occupational History  . retired Chemical engineer)    Social History Main Topics  . Smoking status: Current Every Day Smoker -- 1.50 packs/day for 50 years  . Smokeless tobacco: Never Used  . Alcohol Use: Yes     Comment: maybe 1-2 times per year.  . Drug Use: No  . Sexual  Activity: Not on file   Other Topics Concern  . Not on file   Social History Narrative   Lives with husband, 1 cat.  Son lives in [place]   Current Outpatient Prescriptions on File Prior to Visit  Medication Sig Dispense Refill  . Calcium-Vitamin D (CALTRATE 600 PLUS-VIT D PO) Take 1 each by mouth daily.      . Multiple Vitamins-Minerals (HAIR/SKIN/NAILS PO) Take 1 tablet by mouth daily.      . simvastatin (ZOCOR) 20 MG tablet Take 1 tablet (20 mg total) by mouth every evening.  90 tablet  1  . albuterol (PROVENTIL HFA;VENTOLIN HFA) 108 (90 BASE) MCG/ACT inhaler Inhale 2 puffs into the lungs every 6 (six) hours as needed for wheezing.  1 Inhaler  0   No current facility-administered medications on file prior to visit.   No Known Allergies  ROS:  Denies chest pain, URI symptoms, cough, shortness of breath, dysphagia or other GI complaints. Denies hot flashes, fever, chills, bleeding/bruising, rashes or other concerns.  PHYSICAL EXAM: BP 126/80  Pulse 76  Ht 5\' 7"  (1.702 m)  Wt 159 lb (72.122 kg)  BMI 24.9 kg/m2 Pleasant female in no distress. Normal mood, affect, hygiene and grooming; normal speech, eye contact.  DEXA results from  08/27/13: DUAL X-RAY ABSORPTIOMETRY (DXA) FOR BONE MINERAL DENSITY  AP LUMBAR SPINE (L1 - L4)  Bone Mineral Density (BMD): 0.887 g/cm2  Young Adult T Score: -1.5  Z Score: 0.4  LEFT FEMUR (NECK)  Bone Mineral Density (BMD): 0.627 g/cm2  Young Adult T Score: -2.0  Z Score: -0.4  ASSESSMENT: Patient's diagnostic category is LOW BONE MASS by WHO  Criteria.  FRACTURE RISK: MODERATE  FRAX: Based on the World Health Organization FRAX model, the 10  year probability of a major osteoporotic fracture is 19%. The 10  year probability of a hip fracture is 3.3%.  Comparison: There has been a statistically significant 3.7%  decrease in BMD in the spine and no significant change in BMD in  the total left hip as compared to 08/27/2001.     Chemistry       Component Value Date/Time   NA 138 06/22/2013 1150   K 4.4 06/22/2013 1150   CL 102 06/22/2013 1150   CO2 27 06/22/2013 1150   BUN 12 06/22/2013 1150   CREATININE 0.80 06/22/2013 1150      Component Value Date/Time   CALCIUM 9.8 06/22/2013 1150   ALKPHOS 105 06/22/2013 1150   AST 21 06/22/2013 1150   ALT 14 06/22/2013 1150   BILITOT 0.6 06/22/2013 1150     Lab Results  Component Value Date   CHOL 161 06/22/2013   HDL 46 06/22/2013   LDLCALC 91 06/22/2013   TRIG 120 06/22/2013   CHOLHDL 3.5 06/22/2013   Lab Results  Component Value Date   WBC 5.7 06/22/2013   HGB 15.4* 06/22/2013   HCT 43.5 06/22/2013   MCV 92.4 06/22/2013   PLT 234 06/22/2013   Lab Results  Component Value Date   TSH 3.005 06/22/2013    ASSESSMENT/PLAN:  Osteopenia - Plan: alendronate (FOSAMAX) 70 MG tablet  Need for prophylactic vaccination and inoculation against influenza - Plan: Flu vaccine HIGH DOSE PF (Fluzone Tri High dose)  Current smoker  Reviewed risks/side effects of bisphosphonates.  If not tolerating, pt is to contact us.  Briefly discussed Evista also. Discussed weekly vs monthly medication.  Will start with weekly alendronate; she will contact us if having side effects  All lab results from July were reviewed with patient in detail.  25 minute visit, all spent counseling--discussing diagnosis of osteopenia vs osteoporosis, various treatment options, risks/side effects of medication.  Also counseled re: smoking risks and cessation.  Reviewed July's labs with patient per her request, and all questions answered.  F/u in January, as previously scheduled, sooner prn.

## 2013-09-18 ENCOUNTER — Encounter: Payer: Self-pay | Admitting: Internal Medicine

## 2013-11-06 ENCOUNTER — Encounter: Payer: Self-pay | Admitting: Family Medicine

## 2013-11-06 ENCOUNTER — Ambulatory Visit (INDEPENDENT_AMBULATORY_CARE_PROVIDER_SITE_OTHER): Payer: Medicare Other | Admitting: Family Medicine

## 2013-11-06 VITALS — BP 130/80 | HR 88 | Wt 159.0 lb

## 2013-11-06 DIAGNOSIS — S139XXA Sprain of joints and ligaments of unspecified parts of neck, initial encounter: Secondary | ICD-10-CM

## 2013-11-06 DIAGNOSIS — S161XXA Strain of muscle, fascia and tendon at neck level, initial encounter: Secondary | ICD-10-CM

## 2013-11-06 NOTE — Patient Instructions (Signed)
Use heat for 20 minutes 3 times per day. You can take up to 4 ibuprofen 3 times per day for the discomfort
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
## 2013-11-23 ENCOUNTER — Ambulatory Visit (INDEPENDENT_AMBULATORY_CARE_PROVIDER_SITE_OTHER): Payer: Medicare Other | Admitting: Family Medicine

## 2013-11-23 ENCOUNTER — Encounter: Payer: Self-pay | Admitting: Family Medicine

## 2013-11-23 VITALS — BP 124/80 | HR 78

## 2013-11-23 DIAGNOSIS — S139XXA Sprain of joints and ligaments of unspecified parts of neck, initial encounter: Secondary | ICD-10-CM

## 2013-11-23 DIAGNOSIS — R609 Edema, unspecified: Secondary | ICD-10-CM

## 2013-11-23 DIAGNOSIS — S161XXA Strain of muscle, fascia and tendon at neck level, initial encounter: Secondary | ICD-10-CM

## 2013-11-23 DIAGNOSIS — R600 Localized edema: Secondary | ICD-10-CM

## 2013-11-23 NOTE — Progress Notes (Signed)
   Subjective:    [name], female    DOB: 10/13/1946, 67 y.o.   MRN: [id number]  HPI She is here for recheck. She states that her right neck and shoulder are now back to normal but still having difficulty with left neck and left wrist . She has been using heat, ibuprofen and stretching. No numbness, tingling or weakness. She also noted some swelling underneath her right thigh earlier today but no itching. No edema anywhere else.   Review of Systems     Objective:   Physical Exam Alert and in no distress. Full motion of the neck. Slight tenderness to palpation in the left lateral cervical muscles. Normal strength and reflexes. Exam of the face does show some slight edema in the right lower eyelid. Cornea and conjunctiva are normal.       Assessment & Plan:  Cervical strain, acute, initial encounter - Plan: Ambulatory referral to Physical Therapy  Facial edema  she will continue with heat and stretching. I will send her to physical therapy and have her return here in one month. No therapy needed for the edema

## 2013-12-03 ENCOUNTER — Ambulatory Visit: Payer: Medicare Other

## 2013-12-09 DIAGNOSIS — Z029 Encounter for administrative examinations, unspecified: Secondary | ICD-10-CM

## 2013-12-23 ENCOUNTER — Ambulatory Visit (INDEPENDENT_AMBULATORY_CARE_PROVIDER_SITE_OTHER): Payer: Medicare Other | Admitting: Family Medicine

## 2013-12-23 ENCOUNTER — Encounter: Payer: Self-pay | Admitting: Family Medicine

## 2013-12-23 VITALS — BP 120/78 | HR 80 | Ht 67.0 in | Wt 159.0 lb

## 2013-12-23 DIAGNOSIS — M8580 Other specified disorders of bone density and structure, unspecified site: Secondary | ICD-10-CM

## 2013-12-23 DIAGNOSIS — F17200 Nicotine dependence, unspecified, uncomplicated: Secondary | ICD-10-CM

## 2013-12-23 DIAGNOSIS — E559 Vitamin D deficiency, unspecified: Secondary | ICD-10-CM

## 2013-12-23 DIAGNOSIS — E7800 Pure hypercholesterolemia, unspecified: Secondary | ICD-10-CM

## 2013-12-23 DIAGNOSIS — M949 Disorder of cartilage, unspecified: Secondary | ICD-10-CM

## 2013-12-23 DIAGNOSIS — M899 Disorder of bone, unspecified: Secondary | ICD-10-CM

## 2013-12-23 DIAGNOSIS — Z79899 Other long term (current) drug therapy: Secondary | ICD-10-CM

## 2013-12-23 LAB — HEPATIC FUNCTION PANEL
ALBUMIN: 4.1 g/dL (ref 3.5–5.2)
ALK PHOS: 90 U/L (ref 39–117)
ALT: 14 U/L (ref 0–35)
AST: 18 U/L (ref 0–37)
BILIRUBIN DIRECT: 0.1 mg/dL (ref 0.0–0.3)
BILIRUBIN TOTAL: 0.5 mg/dL (ref 0.2–1.2)
Indirect Bilirubin: 0.4 mg/dL (ref 0.2–1.2)
Total Protein: 7.1 g/dL (ref 6.0–8.3)

## 2013-12-23 MED ORDER — SIMVASTATIN 20 MG PO TABS: 20.0000 mg | ORAL_TABLET | Freq: Every evening | ORAL | Status: DC
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
## 2013-12-23 NOTE — Patient Instructions (Addendum)
Please try and quit smoking--start thinking about why/when you smoke (habit, boredom, stress) in order to come up with effective strategies to cut back or quit. Available resources to help you quit include free counseling through [facility]uit[facility] ([facility]Quitline[facility]com or 1-800-QUITNOW), smoking cessation classes through [facility] (call to find out schedule), over-the-counter nicotine replacements, and e-cigarettes (although this may not help break the hand-mouth habit).  Many insurance companies also have smoking cessation programs (which may decrease the cost of patches, meds if enrolled).  If these methods are not effective for you, and you are motivated to quit, return to discuss the possibility of prescription medications.  Continue to take your simvastatin--you need to take the cholesterol medication for the rest of your life (unless I say otherwise).  If you run out, please call the pharmacy to get refills--it is not our intent to have you stop the medication.  If you continue to have side effects from the alendronate (fosamax), we can change you to a monthly medication (so you might just have side effects once a month, rather than once a week).  Let us know if you prefer to change the medication.  It would be Actonel (generic if available)--in case you want to check on the cost at the pharmacy.

## 2013-12-24 ENCOUNTER — Encounter: Payer: Self-pay | Admitting: Family Medicine

## 2013-12-24 LAB — VITAMIN D 25 HYDROXY (VIT D DEFICIENCY, FRACTURES): VIT D 25 HYDROXY: 39 ng/mL (ref 30–89)

## 2013-12-31 ENCOUNTER — Ambulatory Visit (INDEPENDENT_AMBULATORY_CARE_PROVIDER_SITE_OTHER): Payer: Medicare Other | Admitting: Family Medicine

## 2013-12-31 ENCOUNTER — Encounter: Payer: Self-pay | Admitting: Family Medicine

## 2013-12-31 VITALS — BP 124/80 | HR 80 | Wt 158.0 lb

## 2013-12-31 DIAGNOSIS — S139XXA Sprain of joints and ligaments of unspecified parts of neck, initial encounter: Secondary | ICD-10-CM

## 2013-12-31 DIAGNOSIS — S161XXA Strain of muscle, fascia and tendon at neck level, initial encounter: Secondary | ICD-10-CM

## 2013-12-31 NOTE — Progress Notes (Signed)
   Subjective:    [name], female    DOB: 08/22/1946, 68 y.o.   MRN: [id number]  HPI She is here for recheck. She has been involved with physical therapy and has made good progress. She now notes pain only intermittently when she turns her head to the right. No numbness, tingling.   Review of Systems     Objective:   Physical Exam Alert and in no distress. Full motion of the neck is noted. No tenderness to palpation.       Assessment & Plan:  Cervical muscle strain  Continue to work on range of motion heat and stretching. Return here as needed.

## 2013-12-31 NOTE — Patient Instructions (Signed)
Keep doing your exercises and use heat.

## 2014-01-21 ENCOUNTER — Telehealth: Payer: Self-pay | Admitting: Internal Medicine

## 2014-01-21 NOTE — Telephone Encounter (Signed)
Faxed over medical records to [employer] @ [phone]

## 2014-04-20 ENCOUNTER — Telehealth: Payer: Self-pay | Admitting: Internal Medicine

## 2014-04-20 DIAGNOSIS — M8580 Other specified disorders of bone density and structure, unspecified site: Secondary | ICD-10-CM

## 2014-04-20 MED ORDER — ALENDRONATE SODIUM 70 MG PO TABS: 70.0000 mg | ORAL_TABLET | ORAL | Status: DC

## 2014-04-20 NOTE — Telephone Encounter (Signed)
A 90 day supply of alendronate 70mg  to [employer]

## 2014-04-20 NOTE — Telephone Encounter (Signed)
done

## 2014-06-17 ENCOUNTER — Other Ambulatory Visit: Payer: Self-pay | Admitting: Family Medicine

## 2014-06-23 ENCOUNTER — Encounter: Payer: Medicare Other | Admitting: Family Medicine

## 2014-08-19 ENCOUNTER — Other Ambulatory Visit: Payer: Self-pay

## 2014-08-19 DIAGNOSIS — Z1231 Encounter for screening mammogram for malignant neoplasm of breast: Secondary | ICD-10-CM

## 2014-08-25 ENCOUNTER — Encounter: Payer: Self-pay | Admitting: Family Medicine

## 2014-08-25 ENCOUNTER — Ambulatory Visit (INDEPENDENT_AMBULATORY_CARE_PROVIDER_SITE_OTHER): Payer: Medicare Other | Admitting: Family Medicine

## 2014-08-25 VITALS — BP 110/80 | HR 80 | Ht 67.0 in | Wt 158.0 lb

## 2014-08-25 DIAGNOSIS — E559 Vitamin D deficiency, unspecified: Secondary | ICD-10-CM

## 2014-08-25 DIAGNOSIS — M8580 Other specified disorders of bone density and structure, unspecified site: Secondary | ICD-10-CM

## 2014-08-25 DIAGNOSIS — R0600 Dyspnea, unspecified: Secondary | ICD-10-CM

## 2014-08-25 DIAGNOSIS — Z0000 Encounter for general adult medical examination without abnormal findings: Secondary | ICD-10-CM

## 2014-08-25 DIAGNOSIS — F17200 Nicotine dependence, unspecified, uncomplicated: Secondary | ICD-10-CM

## 2014-08-25 DIAGNOSIS — M899 Disorder of bone, unspecified: Secondary | ICD-10-CM

## 2014-08-25 DIAGNOSIS — E7800 Pure hypercholesterolemia, unspecified: Secondary | ICD-10-CM

## 2014-08-25 DIAGNOSIS — M949 Disorder of cartilage, unspecified: Secondary | ICD-10-CM

## 2014-08-25 DIAGNOSIS — D582 Other hemoglobinopathies: Secondary | ICD-10-CM

## 2014-08-25 DIAGNOSIS — Z23 Encounter for immunization: Secondary | ICD-10-CM

## 2014-08-25 DIAGNOSIS — Z79899 Other long term (current) drug therapy: Secondary | ICD-10-CM

## 2014-08-25 DIAGNOSIS — R0989 Other specified symptoms and signs involving the circulatory and respiratory systems: Secondary | ICD-10-CM

## 2014-08-25 DIAGNOSIS — R0609 Other forms of dyspnea: Secondary | ICD-10-CM

## 2014-08-25 LAB — POCT URINALYSIS DIPSTICK
BILIRUBIN UA: NEGATIVE
GLUCOSE UA: NEGATIVE
KETONES UA: NEGATIVE
LEUKOCYTES UA: NEGATIVE
NITRITE UA: NEGATIVE
Protein, UA: NEGATIVE
Spec Grav, UA: 1.01
Urobilinogen, UA: NEGATIVE
pH, UA: 5

## 2014-08-25 LAB — TSH: TSH: 3.501 u[IU]/mL (ref 0.350–4.500)

## 2014-08-25 MED ORDER — RISEDRONATE SODIUM 150 MG PO TABS: 150.0000 mg | ORAL_TABLET | ORAL | Status: DC
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
## 2014-08-25 NOTE — Patient Instructions (Signed)
  HEALTH MAINTENANCE RECOMMENDATIONS:  It is recommended that you get at least 30 minutes of aerobic exercise at least 5 days/week (for weight loss, you may need as much as 60-90 minutes). This can be any activity that gets your heart rate up. This can be divided in 10-15 minute intervals if needed, but try and build up your endurance at least once a week.  Weight bearing exercise is also recommended twice weekly.  Eat a healthy diet with lots of vegetables, fruits and fiber.  "Colorful" foods have a lot of vitamins (ie green vegetables, tomatoes, red peppers, etc).  Limit sweet tea, regular sodas and alcoholic beverages, all of which has a lot of calories and sugar.  Up to 1 alcoholic drink daily may be beneficial for women (unless trying to lose weight, watch sugars).  Drink a lot of water.  Calcium recommendations are 1200-1500 mg daily (1500 mg for postmenopausal women or women without ovaries), and vitamin D 1000 IU daily.  This should be obtained from diet and/or supplements (vitamins), and calcium should not be taken all at once, but in divided doses.  Monthly self breast exams and yearly mammograms for women over the age of 9 is recommended.  Sunscreen of at least SPF 30 should be used on all sun-exposed parts of the skin when outside between the hours of 10 am and 4 pm (not just when at beach or pool, but even with exercise, golf, tennis, and yard work!)  Use a sunscreen that says "broad spectrum" so it covers both UVA and UVB rays, and make sure to reapply every 1-2 hours.  Remember to change the batteries in your smoke detectors when changing your clock times in the spring and fall.  Use your seat belt every time you are in a car, and please drive safely and not be distracted with cell phones and texting while driving.  Please try and quit smoking--start thinking about why/when you smoke (habit, boredom, stress) in order to come up with effective strategies to cut back or quit. Available  resources to help you quit include free counseling through [facility] ([place]uit[facility] or 1-800-QUITNOW), smoking cessation classes through [facility] (call to find out schedule), over-the-counter nicotine replacements, and e-cigarettes (although this may not help break the hand-mouth habit).  Many insurance companies also have smoking cessation programs (which may decrease the cost of patches, meds if enrolled).  If these methods are not effective for you, and you are motivated to quit, return to discuss the possibility of prescription medications.  I recommend shingles vaccine.  You may need to get through a pharmacy--check with your insurance.  You need to wait a month from today to get (a month apart from any vaccines).  Schedule routine eye exam. Recommended routine dental exam for screen for oral cancers (you had your dentures in, which precludes me from doing any kind of visual inspection for mouth cancers)  Start the new osteoporosis medication.  Take prilosec OTC the night before, and once daily if needed for ongoing nausea (ie if you have symptoms for the first few days, you can take it until the nausea resolves). You still need to wait 30 minutes to eat or recline. You still need to drink a full glass of water when taking the medication. You need to contact us if you develop any chest pain or trouble swallowing. You need to contact us if you don't tolerate the medication due to side effects.

## 2014-08-26 ENCOUNTER — Other Ambulatory Visit: Payer: Self-pay | Admitting: *Deleted

## 2014-08-26 DIAGNOSIS — R0600 Dyspnea, unspecified: Secondary | ICD-10-CM

## 2014-08-26 LAB — LIPID PANEL
CHOL/HDL RATIO: 3.4 ratio
Cholesterol: 175 mg/dL (ref 0–200)
HDL: 52 mg/dL (ref >39)
LDL Cholesterol: 100 mg/dL — ABNORMAL HIGH (ref 0–99)
TRIGLYCERIDES: 115 mg/dL (ref <150)
VLDL: 23 mg/dL (ref 0–40)

## 2014-08-26 LAB — CBC WITH DIFFERENTIAL/PLATELET
Basophils Absolute: 0.1 10*3/uL (ref 0.0–0.1)
Basophils Relative: 1 % (ref 0–1)
EOS PCT: 3 % (ref 0–5)
Eosinophils Absolute: 0.2 10*3/uL (ref 0.0–0.7)
HEMATOCRIT: 45.7 % (ref 36.0–46.0)
HEMOGLOBIN: 15.9 g/dL — AB (ref 12.0–15.0)
LYMPHS ABS: 1.8 10*3/uL (ref 0.7–4.0)
LYMPHS PCT: 30 % (ref 12–46)
MCH: 32.3 pg (ref 26.0–34.0)
MCHC: 34.8 g/dL (ref 30.0–36.0)
MCV: 92.9 fL (ref 78.0–100.0)
MONO ABS: 0.5 10*3/uL (ref 0.1–1.0)
MONOS PCT: 8 % (ref 3–12)
Neutro Abs: 3.4 10*3/uL (ref 1.7–7.7)
Neutrophils Relative %: 58 % (ref 43–77)
PLATELETS: 239 10*3/uL (ref 150–400)
RBC: 4.92 MIL/uL (ref 3.87–5.11)
RDW: 13.5 % (ref 11.5–15.5)
WBC: 5.9 10*3/uL (ref 4.0–10.5)

## 2014-08-26 LAB — COMPREHENSIVE METABOLIC PANEL
ALT: 17 U/L (ref 0–35)
AST: 22 U/L (ref 0–37)
Albumin: 4.4 g/dL (ref 3.5–5.2)
Alkaline Phosphatase: 97 U/L (ref 39–117)
BILIRUBIN TOTAL: 0.5 mg/dL (ref 0.2–1.2)
BUN: 13 mg/dL (ref 6–23)
CO2: 27 meq/L (ref 19–32)
CREATININE: 0.84 mg/dL (ref 0.50–1.10)
Calcium: 9.4 mg/dL (ref 8.4–10.5)
Chloride: 99 mEq/L (ref 96–112)
GLUCOSE: 100 mg/dL — AB (ref 70–99)
Potassium: 4.6 mEq/L (ref 3.5–5.3)
Sodium: 137 mEq/L (ref 135–145)
Total Protein: 7.9 g/dL (ref 6.0–8.3)

## 2014-08-30 ENCOUNTER — Ambulatory Visit
Admission: RE | Admit: 2014-08-30 | Discharge: 2014-08-30 | Disposition: A | Discharge status: Home / Self Care | Payer: Medicare Other | Source: Ambulatory Visit | Attending: Family Medicine | Admitting: Family Medicine

## 2014-08-30 DIAGNOSIS — R0600 Dyspnea, unspecified: Secondary | ICD-10-CM

## 2014-09-02 DIAGNOSIS — R942 Abnormal results of pulmonary function studies: Secondary | ICD-10-CM

## 2014-09-02 HISTORY — DX: Abnormal results of pulmonary function studies: R94.2

## 2014-09-02 LAB — PULMONARY FUNCTION TEST

## 2014-09-06 ENCOUNTER — Encounter: Payer: Self-pay | Admitting: *Deleted

## 2014-09-09 ENCOUNTER — Ambulatory Visit
Admission: RE | Admit: 2014-09-09 | Discharge: 2014-09-09 | Disposition: A | Discharge status: Home / Self Care | Payer: Medicare Other | Source: Ambulatory Visit

## 2014-09-09 DIAGNOSIS — Z1231 Encounter for screening mammogram for malignant neoplasm of breast: Secondary | ICD-10-CM

## 2014-09-10 ENCOUNTER — Encounter: Payer: Self-pay | Admitting: Family Medicine

## 2014-09-22 ENCOUNTER — Other Ambulatory Visit: Payer: Self-pay | Admitting: Family Medicine

## 2014-09-27 ENCOUNTER — Encounter: Payer: Self-pay | Admitting: *Deleted

## 2014-12-17 ENCOUNTER — Other Ambulatory Visit: Payer: Self-pay | Admitting: Family Medicine

## 2014-12-28 ENCOUNTER — Other Ambulatory Visit: Payer: Self-pay | Admitting: Family Medicine

## 2015-02-24 ENCOUNTER — Encounter: Payer: Self-pay | Admitting: Family Medicine

## 2015-02-24 ENCOUNTER — Ambulatory Visit (INDEPENDENT_AMBULATORY_CARE_PROVIDER_SITE_OTHER): Payer: Medicare Other | Admitting: Family Medicine

## 2015-02-24 VITALS — BP 128/72 | HR 80 | Ht 67.0 in | Wt 158.4 lb

## 2015-02-24 DIAGNOSIS — J449 Chronic obstructive pulmonary disease, unspecified: Secondary | ICD-10-CM | POA: Insufficient documentation

## 2015-02-24 DIAGNOSIS — F17200 Nicotine dependence, unspecified, uncomplicated: Secondary | ICD-10-CM

## 2015-02-24 DIAGNOSIS — Z720 Tobacco use: Secondary | ICD-10-CM

## 2015-02-24 DIAGNOSIS — M8580 Other specified disorders of bone density and structure, unspecified site: Secondary | ICD-10-CM

## 2015-02-24 DIAGNOSIS — E559 Vitamin D deficiency, unspecified: Secondary | ICD-10-CM | POA: Diagnosis not present

## 2015-02-24 DIAGNOSIS — R7309 Other abnormal glucose: Secondary | ICD-10-CM

## 2015-02-24 DIAGNOSIS — E7800 Pure hypercholesterolemia, unspecified: Secondary | ICD-10-CM

## 2015-02-24 LAB — LIPID PANEL
Cholesterol: 182 mg/dL (ref 0–200)
HDL: 44 mg/dL — AB (ref >=46)
LDL Cholesterol: 113 mg/dL — ABNORMAL HIGH (ref 0–99)
Total CHOL/HDL Ratio: 4.1 Ratio
Triglycerides: 125 mg/dL (ref <150)
VLDL: 25 mg/dL (ref 0–40)

## 2015-02-24 LAB — HEPATIC FUNCTION PANEL
ALBUMIN: 4.4 g/dL (ref 3.5–5.2)
ALT: 14 U/L (ref 0–35)
AST: 19 U/L (ref 0–37)
Alkaline Phosphatase: 89 U/L (ref 39–117)
BILIRUBIN DIRECT: 0.1 mg/dL (ref 0.0–0.3)
BILIRUBIN TOTAL: 0.6 mg/dL (ref 0.2–1.2)
Indirect Bilirubin: 0.5 mg/dL (ref 0.2–1.2)
Total Protein: 7.5 g/dL (ref 6.0–8.3)

## 2015-02-24 LAB — GLUCOSE, RANDOM: GLUCOSE: 98 mg/dL (ref 70–99)

## 2015-02-24 MED ORDER — SIMVASTATIN 20 MG PO TABS: 20.0000 mg | ORAL_TABLET | Freq: Every evening | ORAL | Status: DC
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
## 2015-02-24 NOTE — Patient Instructions (Signed)
Please try and quit smoking--start thinking about why/when you smoke (habit, boredom, stress) in order to come up with effective strategies to cut back or quit. Available resources to help you quit include free counseling through [facility] ([place]uit[facility] or 1-800-QUITNOW), smoking cessation classes through [facility] (call to find out schedule), over-the-counter nicotine replacements, and e-cigarettes (although this may not help break the hand-mouth habit).  Many insurance companies also have smoking cessation programs (which may decrease the cost of patches, meds if enrolled).  If these methods are not effective for you, and you are motivated to quit, return to discuss the possibility of prescription medications.  Continue your current medications

## 2015-04-20 ENCOUNTER — Encounter: Payer: Self-pay | Admitting: *Deleted

## 2015-08-31 ENCOUNTER — Encounter: Payer: Self-pay | Admitting: Family Medicine

## 2015-08-31 ENCOUNTER — Ambulatory Visit (INDEPENDENT_AMBULATORY_CARE_PROVIDER_SITE_OTHER): Payer: Medicare Other | Admitting: Family Medicine

## 2015-08-31 VITALS — BP 130/78 | HR 76 | Ht 67.0 in | Wt 161.2 lb

## 2015-08-31 DIAGNOSIS — J449 Chronic obstructive pulmonary disease, unspecified: Secondary | ICD-10-CM

## 2015-08-31 DIAGNOSIS — E7800 Pure hypercholesterolemia, unspecified: Secondary | ICD-10-CM

## 2015-08-31 DIAGNOSIS — Z1159 Encounter for screening for other viral diseases: Secondary | ICD-10-CM

## 2015-08-31 DIAGNOSIS — Z23 Encounter for immunization: Secondary | ICD-10-CM | POA: Diagnosis not present

## 2015-08-31 DIAGNOSIS — D692 Other nonthrombocytopenic purpura: Secondary | ICD-10-CM

## 2015-08-31 DIAGNOSIS — Z0000 Encounter for general adult medical examination without abnormal findings: Secondary | ICD-10-CM

## 2015-08-31 DIAGNOSIS — F17200 Nicotine dependence, unspecified, uncomplicated: Secondary | ICD-10-CM

## 2015-08-31 DIAGNOSIS — D472 Monoclonal gammopathy: Secondary | ICD-10-CM | POA: Diagnosis not present

## 2015-08-31 DIAGNOSIS — M8580 Other specified disorders of bone density and structure, unspecified site: Secondary | ICD-10-CM

## 2015-08-31 DIAGNOSIS — R0600 Dyspnea, unspecified: Secondary | ICD-10-CM | POA: Diagnosis not present

## 2015-08-31 DIAGNOSIS — E559 Vitamin D deficiency, unspecified: Secondary | ICD-10-CM

## 2015-08-31 LAB — COMPREHENSIVE METABOLIC PANEL
ALT: 13 U/L (ref 6–29)
AST: 18 U/L (ref 10–35)
Albumin: 4.2 g/dL (ref 3.6–5.1)
Alkaline Phosphatase: 98 U/L (ref 33–130)
BUN: 12 mg/dL (ref 7–25)
CALCIUM: 8.7 mg/dL (ref 8.6–10.4)
CHLORIDE: 100 mmol/L (ref 98–110)
CO2: 26 mmol/L (ref 20–31)
Creat: 0.81 mg/dL (ref 0.50–0.99)
GLUCOSE: 100 mg/dL — AB (ref 65–99)
POTASSIUM: 4.4 mmol/L (ref 3.5–5.3)
Sodium: 136 mmol/L (ref 135–146)
TOTAL PROTEIN: 7.2 g/dL (ref 6.1–8.1)
Total Bilirubin: 0.6 mg/dL (ref 0.2–1.2)

## 2015-08-31 LAB — POCT URINALYSIS DIPSTICK
Bilirubin, UA: NEGATIVE
GLUCOSE UA: NEGATIVE
Ketones, UA: NEGATIVE
LEUKOCYTES UA: NEGATIVE
NITRITE UA: NEGATIVE
Protein, UA: NEGATIVE
Spec Grav, UA: 1.015
UROBILINOGEN UA: NEGATIVE
pH, UA: 6

## 2015-08-31 LAB — CBC WITH DIFFERENTIAL/PLATELET
BASOS PCT: 1 % (ref 0–1)
Basophils Absolute: 0.1 10*3/uL (ref 0.0–0.1)
EOS ABS: 0.1 10*3/uL (ref 0.0–0.7)
EOS PCT: 2 % (ref 0–5)
HCT: 43.7 % (ref 36.0–46.0)
Hemoglobin: 15.4 g/dL — ABNORMAL HIGH (ref 12.0–15.0)
Lymphocytes Relative: 32 % (ref 12–46)
Lymphs Abs: 1.7 10*3/uL (ref 0.7–4.0)
MCH: 32.5 pg (ref 26.0–34.0)
MCHC: 35.2 g/dL (ref 30.0–36.0)
MCV: 92.2 fL (ref 78.0–100.0)
MONO ABS: 0.4 10*3/uL (ref 0.1–1.0)
MONOS PCT: 8 % (ref 3–12)
MPV: 11.6 fL (ref 8.6–12.4)
Neutro Abs: 3.1 10*3/uL (ref 1.7–7.7)
Neutrophils Relative %: 57 % (ref 43–77)
PLATELETS: 234 10*3/uL (ref 150–400)
RBC: 4.74 MIL/uL (ref 3.87–5.11)
RDW: 13.6 % (ref 11.5–15.5)
WBC: 5.4 10*3/uL (ref 4.0–10.5)

## 2015-08-31 LAB — HEMOCCULT GUIAC POC 1CARD (OFFICE): FECAL OCCULT BLD: NEGATIVE

## 2015-08-31 LAB — LIPID PANEL
CHOL/HDL RATIO: 4 ratio (ref <=5.0)
CHOLESTEROL: 198 mg/dL (ref 125–200)
HDL: 50 mg/dL (ref >=46)
LDL Cholesterol: 126 mg/dL (ref <130)
Triglycerides: 110 mg/dL (ref <150)
VLDL: 22 mg/dL (ref <30)

## 2015-08-31 LAB — TSH: TSH: 3.98 u[IU]/mL (ref 0.350–4.500)

## 2015-08-31 MED ORDER — IBANDRONATE SODIUM 150 MG PO TABS: ORAL_TABLET | ORAL | Status: DC

## 2015-08-31 NOTE — Patient Instructions (Addendum)
HEALTH MAINTENANCE RECOMMENDATIONS:  It is recommended that you get at least 30 minutes of aerobic exercise at least 5 days/week (for weight loss, you may need as much as 60-90 minutes). This can be any activity that gets your heart rate up. This can be divided in 10-15 minute intervals if needed, but try and build up your endurance at least once a week.  Weight bearing exercise is also recommended twice weekly.  Eat a healthy diet with lots of vegetables, fruits and fiber.  "Colorful" foods have a lot of vitamins (ie green vegetables, tomatoes, red peppers, etc).  Limit sweet tea, regular sodas and alcoholic beverages, all of which has a lot of calories and sugar.  Up to 1 alcoholic drink daily may be beneficial for women (unless trying to lose weight, watch sugars).  Drink a lot of water.  Calcium recommendations are 1200-1500 mg daily (1500 mg for postmenopausal women or women without ovaries), and vitamin D 1000 IU daily.  This should be obtained from diet and/or supplements (vitamins), and calcium should not be taken all at once, but in divided doses.  Monthly self breast exams and yearly mammograms for women over the age of 26 is recommended.  Sunscreen of at least SPF 30 should be used on all sun-exposed parts of the skin when outside between the hours of 10 am and 4 pm (not just when at beach or pool, but even with exercise, golf, tennis, and yard work!)  Use a sunscreen that says "broad spectrum" so it covers both UVA and UVB rays, and make sure to reapply every 1-2 hours.  Remember to change the batteries in your smoke detectors when changing your clock times in the spring and fall.  Use your seat belt every time you are in a car, and please drive safely and not be distracted with cell phones and texting while driving.   Please consider getting the shingles vaccine from your pharmacy (we can't give it at the office, covered under your pharmacy benefit, and they should be able to tell you  how much it will cost you). You need to wait a month from today, as it needs to be separated from other vaccines by a month.  Please try and quit smoking--start thinking about why/when you smoke (habit, boredom, stress) in order to come up with effective strategies to cut back or quit. Available resources to help you quit include free counseling through [facility]uitline ([place]Quitline.[facility] or 1-800-QUITNOW), smoking cessation classes through [facility] (call to find out schedule), over-the-counter nicotine replacements, and e-cigarettes (although this may not help break the hand-mouth habit).  Many insurance companies also have smoking cessation programs (which may decrease the cost of patches, meds if enrolled).  If these methods are not effective for you, and you are motivated to quit, return to discuss the possibility of prescription medications.   Ms. Shurtz , Thank you for taking time to come for your Medicare Wellness Visit. I appreciate your ongoing commitment to your health goals. Please review the following plan we discussed and let me know if I can assist you in the future.   These are the goals we discussed: Goals    None      This is a list of the screening recommended for you and due dates:  Health Maintenance  Topic Date Due  .  Hepatitis C: One time screening is recommended by Center for Disease Control  (CDC) for  adults born from [birth year] through 1964.  1946/06/05  . Shingles Vaccine  08/21/2007  . Flu Shot  06/27/2015  . Pneumonia vaccines (2 of 2 - PPSV23) 08/26/2015  . Mammogram  09/09/2016  . Tetanus Vaccine  12/13/2019  . Colon Cancer Screening  11/28/2020  . DEXA scan (bone density measurement)  Completed   Hepatitis C testing was done today. Check about shingles vaccine at pharmacy--it is recommended. Flu shot was given today. The colon cancer screening date above may not be correct.  It automatically put in 10 years from your last.  It is up to Dr.  Wynetta Emery to let you know when he thinks you need to have this repeated (it might be 5 years from the last).   Bone density--another one is due now. Mammogram is due now, please schedule (the date above is based on screening every 2 years, but I recommend it yearly).

## 2015-08-31 NOTE — Progress Notes (Signed)
Chief Complaint  Patient presents with  . Annual Exam    fasting annual exam with pelvic(did have coffee with a little bit of milk)/med check. Did not do eye exam as she just had one. No concerns.    Tiffany [name] is a 69 y.o. female who presents for a complete physical, annual wellness exam, and med check.  She has the following concerns:  Hyperlipidemia follow-up: Patient is reportedly following a low-fat, low cholesterol diet. She ran out of her simvastatin 2 months ago (and never called [place]).  She is curious to see what her cholesterol is when not taking the medications. She didn't have any side effects from it.  Osteopenia--changed from fosamax (due to nausea) to Actonel over 6 months ago. She tolerated this better, without any nausea, but when she went to get it filled from the pharmacy, her insurance wouldn't cover it. She never let us know, so she hasn't been taking anything.  While taking it, she didn't have any chest pain, heartburn or trouble swallowing.  She takes a calcium +D supplement daily. She continues to smoke, and doesn't get any regular weight-bearing exercise (other than walking around carrying a heavy purse on her shoulder).  Last DEXA 08/2013: FRAX: Based on the [place] model, the 10  year probability of a major osteoporotic fracture is 19%. The 10  year probability of a hip fracture is 3.3%.  Comparison: There has been a statistically significant 3.7%  decrease in BMD in the spine and no significant change in BMD in  the total left hip as compared to 08/27/2001.  Vitamin D deficiency. Last level was 39 11/2013 on same supplements.  Tobacco use--She reports she would like to quit, "but it is hard". She has shortness of breath when going up and down the stairs to do her laundry, and in carrying her groceries in (up a hill to her house). She uses albuterol as needed, with good results. Otherwise, has limited exercise, and therefore no  significant shortness of breath.  No problems when walking around stores, or other activity.  Immunization History  Administered Date(s) Administered  . Influenza Split 10/16/2012  . Influenza, High Dose Seasonal PF 09/02/2013, 08/25/2014, 08/31/2015  . Pneumococcal Conjugate-13 08/25/2014  . Pneumococcal Polysaccharide-23 12/12/2009  . Tdap 12/12/2009   Last Pap smear: s/p hyst in 20's  Last mammogram: 09/09/14 Last colonoscopy: 11/2010 Dr. Wynetta Emery  Last DEXA: 08/2013; T-2.0 at L femur, -1.5 at spine Dentist: doesn't go, has dentures  Ophtho: March or April of this year.  Also saw specialist, has "wrinkled retina".  No glaucoma. Exercise: Walks around [place] and the mall for shopping/exercise (no sustained aerobic activity, no weight-bearing exercise).  Other doctors caring for patient include: Dermatologist:  Dr. Allyson Sabal GI:  Dr. Wynetta Emery Ophtho: Dr. Wynetta Emery ophtho (specialist): Dr. Edilia Bo  Depression, Fall and Functional status screening performed--see epic questionnaire. Notable only for trouble with stairs due to dyspnea.   End of Life Discussion:  Patient does not have a living will and medical power of attorney  Past Medical History  Diagnosis Date  . Hyperlipidemia   . Tobacco abuse   . Hypertension     previously treated with meds, resolved  . MGUS (monoclonal gammopathy of unknown significance) 1999    prevously under care of Dr. Learta Codding  . Osteopenia     DEXA 01/31/2010 at [facility]; 2014 at [facility]  . Colon polyp 2/05  . 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 BCC (basal cell carcinoma), face 2010    Dr. Allyson Sabal  . Diverticulosis 11/2010    seen on colonoscopy and air contrast BE  . Abnormal PFT 09/02/14    moderate restrictive ventilatory defect.    Past Surgical History  Procedure Laterality Date  . Bilateral salpingoophorectomy  2007    cancerous tumor R ovary  . Vaginal hysterectomy  late 20's  . Tonsillectomy  age 65  . Cataract  extraction  2009    bilateral  . Omentectomy  2007    and pelvic lymphadenectomy (with BSO)  . Basal cell carcinoma excision  2010    L face, R temple  . Colonoscopy  11/2010    Dr. Wynetta Emery. Diverticulosis. BE also done--normal    Social History   Social History  . Marital Status: Married    Spouse Name: N/A  . Number of Children: 1  . Years of Education: N/A   Occupational History  . retired Manufacturing systems engineer)    Social History Main Topics  . Smoking status: Current Every Day Smoker -- 1.50 packs/day for 50 years  . Smokeless tobacco: Never Used  . Alcohol Use: 0.0 oz/week    0 Standard drinks or equivalent per week     Comment: maybe 1-2 times per year.  . Drug Use: No  . Sexual Activity:    Partners: Male   Other Topics Concern  . Not on file   Social History Narrative   Lives with husband, no pets.  Son lives in [place]. 1 grandson     Family History  Problem Relation Age of Onset  . Stroke Mother   . Hypertension Father   . Diabetes Maternal Aunt   . Throat cancer Maternal Uncle   . Diabetes Maternal Aunt   . Diabetes Maternal Aunt   . Breast cancer Maternal Aunt     2 aunts  . Lung cancer Maternal Uncle   . Suicidality Father     Outpatient Encounter Prescriptions as of 08/31/2015  Medication Sig Note  . Calcium-Vitamin D (CALTRATE 600 PLUS-VIT D PO) Take 1 each by mouth daily.   . Multiple Vitamins-Minerals (HAIR/SKIN/NAILS PO) Take 1 tablet by mouth daily.   Marland Kitchen albuterol (PROVENTIL HFA;VENTOLIN HFA) 108 (90 BASE) MCG/ACT inhaler Inhale 2 puffs into the lungs every 6 (six) hours as needed for wheezing. (Patient not taking: Reported on 08/31/2015) 02/24/2015: Uses it once or twice a week, usually related to going up and down the stairs (doing laundry)  . risedronate (ACTONEL) 150 MG tablet Take 1 tablet (150 mg total) by mouth every 30 (thirty) days. with water on empty stomach, nothing by mouth or lie down for next 30 minutes. (Patient not taking:  Reported on 08/31/2015) 08/31/2015: Insurance would not pay  . simvastatin (ZOCOR) 20 MG tablet Take 1 tablet (20 mg total) by mouth every evening. (Patient not taking: Reported on 08/31/2015) 08/31/2015: Ran out 2 month ago.   No facility-administered encounter medications on file as of 08/31/2015.    No Known Allergies   ROS: The patient denies anorexia, fever, weight changes, headaches, vision changes, decreased hearing, ear pain, sore throat, breast concerns, chest pain, dizziness, syncope, swelling, nausea, vomiting, diarrhea, constipation, abdominal pain, melena, hematochezia, indigestion/heartburn (only after pizza), hematuria, incontinence, dysuria, vaginal bleeding, discharge, odor or itch, genital lesions, joint pains (just intermittent L knee pain), weakness, tremor, suspicious skin lesions, depression, anxiety, abnormal bleeding/bruising, or enlarged lymph nodes. Occasional night sweats, no hot flashes during the  day  Some vaginal dryness  SOB only with stairs and carrying groceries uphill, per HPI. Some smoker's cough -- thick, clear phlegm. 2 weeks ago she felt in irregular heartbeat (at her carotid)--felt three beats followed by a pause. No associated symptoms, and pulse wasn't fast.  PHYSICAL EXAM:  BP 168/90 mmHg  Pulse 76  Ht 5\' 7"  (1.702 m)  Wt 161 lb 3.2 oz (73.12 kg)  BMI 25.24 kg/m2 130/78 on repeat by MD  General Appearance:  Alert, cooperative, no distress, appears stated age   Head:  Normocephalic, without obvious abnormality, atraumatic   Eyes:  PERRL, conjunctiva/corneas clear, EOM's intact, fundi  benign   Ears:  Normal TM's and external ear canals   Nose:  Nares normal, mucosa normal, no drainage or sinus tenderness   Throat:  Lips, visualized mucosa, and tongue normal; upper and lower dentures in place, limiting full visibility of oropharynx  Neck:  Supple, no lymphadenopathy; thyroid: no enlargement/tenderness/nodules; no carotid  bruit or  JVD   Back:  Spine nontender, no curvature, ROM normal, no CVA tenderness   Lungs:  Clear to auscultation bilaterally without wheezes, rales or ronchi; respirations unlabored   Chest Wall:  No tenderness or deformity   Heart:  Regular rate and rhythm, S1 and S2 normal, no murmur, rub  or gallop   Breast Exam:  No tenderness, masses, or nipple discharge or inversion. No axillary lymphadenopathy   Abdomen:  Soft, non-tender, nondistended, normoactive bowel sounds,  no masses, no hepatosplenomegaly   Genitalia:  Normal external genitalia without lesions. BUS and vagina normal, some atrophic changes; No abnormal vaginal discharge. Uterus absent. No adnexal masses or tenderness. Pap not performed   Rectal:  Normal tone, no masses or tenderness; guaiac negative stool   Extremities:  No clubbing, cyanosis or edema   Pulses:  2+ and symmetric all extremities   Skin:  Texture and turgor normal, no rashes. Significant actinic changes to skin.ecchymoses and purpura on both forearms, thin skin  Lymph nodes:  Cervical, supraclavicular, and axillary nodes normal   Neurologic:  CNII-XII intact, normal strength, sensation and gait; reflexes 2+ and symmetric throughout    Psych: Normal mood, affect, hygiene and grooming        ASSESSMENT/PLAN:  Annual physical exam - Plan: POCT Urinalysis Dipstick  Need for prophylactic vaccination and inoculation against influenza - Plan: Flu vaccine HIGH DOSE PF (Fluzone High dose)  Tobacco use disorder - counseled re: risks and cessation assistance options. f/u to discuss meds if interested  Vitamin D deficiency - continue supplements  Pure hypercholesterolemia - check lipids--off med x 2 mos; likely will need to restart - Plan: Comprehensive metabolic panel, Lipid panel  Osteopenia - DEXA due; weight-bearing exercise and smoking cessation encouraged. continue calcium and D - Plan: DG Bone Density,  ibandronate (BONIVA) 150 MG tablet  Chronic obstructive pulmonary disease, unspecified COPD type ([place]) - declines daily med; continue albuterol prn; smoking cessation recommended  Medicare annual wellness visit, initial  Immunization due - Plan: Pneumococcal polysaccharide vaccine 23-valent greater than or equal to 2yo subcutaneous/IM  Dyspnea - Plan: CBC with Differential/Platelet, TSH  MGUS (monoclonal gammopathy of unknown significance) - in her history; hasn't been re-evaluated in many years. Last total protein level was WNL - Plan: SPEP & IFE with QIG  Need for hepatitis C screening test - Plan: Hepatitis C antibody  Senile purpura ([place])   Advised NOT to run out of meds without contacting her pharmacy or [place]. Advised to contact us  if she is unable to get a prescription due to cost/insurance problems.   Discussed monthly self breast exams and yearly mammograms; at least 30 minutes of aerobic activity at least 5 days/week, weight-bearing exercise 2x/wk; proper sunscreen use reviewed; healthy diet, including goals of calcium and vitamin D intake and alcohol recommendations (less than or equal to 1 drink/day) reviewed; regular seatbelt use; changing batteries in smoke detectors. Immunization recommendations discussed--see below. Colonoscopy recommendations reviewed (I believe is UTD--patient hasn't been contacted by Dr. Wynetta Emery stating she is due again). DEXA due, ordered Shingles vaccine recommended, to get at pharmacy Pneumovax today (5 years since the last, and that was <65 yo)  Recommended seeing dentist for oral cancer screening exam  Will likely need to restart cholesterol meds once labs back  Counseled re: smoking cessation.  Medicare Attestation I have personally reviewed: The patient's medical and social history Their use of alcohol, tobacco or illicit drugs Their current medications and supplements The patient's functional ability including ADLs,fall risks, home safety  risks, cognitive, and hearing and visual impairment Diet and physical activities Evidence for depression or mood disorders  The patient's weight, height, and BMI have been recorded in the chart.  I have made referrals, counseling, and provided education to the patient based on review of the above and I have provided the patient with a written personalized care plan for preventive services.     , A, MD   08/31/2015

## 2015-09-01 LAB — HEPATITIS C ANTIBODY: HCV Ab: NEGATIVE

## 2015-09-02 LAB — SPEP & IFE WITH QIG
ALBUMIN ELP: 4 g/dL (ref 3.8–4.8)
Alpha-1-Globulin: 0.3 g/dL (ref 0.2–0.3)
Alpha-2-Globulin: 0.8 g/dL (ref 0.5–0.9)
BETA 2: 0.4 g/dL (ref 0.2–0.5)
BETA GLOBULIN: 0.4 g/dL (ref 0.4–0.6)
Gamma Globulin: 1.3 g/dL (ref 0.8–1.7)
IGM, SERUM: 380 mg/dL — AB (ref 52–322)
IgA: 339 mg/dL (ref 69–380)
IgG (Immunoglobin G), Serum: 1380 mg/dL (ref 690–1700)
Total Protein, Serum Electrophoresis: 7.2 g/dL (ref 6.1–8.1)

## 2015-09-14 ENCOUNTER — Encounter: Payer: Self-pay | Admitting: Family Medicine

## 2015-12-09 ENCOUNTER — Other Ambulatory Visit: Payer: Self-pay

## 2015-12-09 DIAGNOSIS — Z1231 Encounter for screening mammogram for malignant neoplasm of breast: Secondary | ICD-10-CM

## 2016-01-09 ENCOUNTER — Ambulatory Visit
Admission: RE | Admit: 2016-01-09 | Discharge: 2016-01-09 | Disposition: A | Discharge status: Home / Self Care | Payer: Medicare Other | Source: Ambulatory Visit | Attending: Family Medicine | Admitting: Family Medicine

## 2016-01-09 ENCOUNTER — Ambulatory Visit
Admission: RE | Admit: 2016-01-09 | Discharge: 2016-01-09 | Disposition: A | Discharge status: Home / Self Care | Payer: Medicare Other | Source: Ambulatory Visit

## 2016-01-09 DIAGNOSIS — M85851 Other specified disorders of bone density and structure, right thigh: Secondary | ICD-10-CM | POA: Diagnosis not present

## 2016-01-09 DIAGNOSIS — Z1231 Encounter for screening mammogram for malignant neoplasm of breast: Secondary | ICD-10-CM

## 2016-01-09 DIAGNOSIS — M8580 Other specified disorders of bone density and structure, unspecified site: Secondary | ICD-10-CM

## 2016-03-01 ENCOUNTER — Encounter: Payer: Self-pay | Admitting: Family Medicine

## 2016-03-01 ENCOUNTER — Ambulatory Visit (INDEPENDENT_AMBULATORY_CARE_PROVIDER_SITE_OTHER): Payer: Medicare Other | Admitting: Family Medicine

## 2016-03-01 VITALS — BP 120/74 | HR 84 | Ht 67.0 in | Wt 157.4 lb

## 2016-03-01 DIAGNOSIS — J449 Chronic obstructive pulmonary disease, unspecified: Secondary | ICD-10-CM

## 2016-03-01 DIAGNOSIS — E559 Vitamin D deficiency, unspecified: Secondary | ICD-10-CM | POA: Diagnosis not present

## 2016-03-01 DIAGNOSIS — M8580 Other specified disorders of bone density and structure, unspecified site: Secondary | ICD-10-CM | POA: Diagnosis not present

## 2016-03-01 DIAGNOSIS — R7301 Impaired fasting glucose: Secondary | ICD-10-CM | POA: Diagnosis not present

## 2016-03-01 DIAGNOSIS — F17200 Nicotine dependence, unspecified, uncomplicated: Secondary | ICD-10-CM | POA: Diagnosis not present

## 2016-03-01 DIAGNOSIS — E7800 Pure hypercholesterolemia, unspecified: Secondary | ICD-10-CM

## 2016-03-01 LAB — LIPID PANEL
Cholesterol: 208 mg/dL — ABNORMAL HIGH (ref 125–200)
HDL: 50 mg/dL (ref >=46)
LDL Cholesterol: 133 mg/dL — ABNORMAL HIGH (ref <130)
TRIGLYCERIDES: 123 mg/dL (ref <150)
Total CHOL/HDL Ratio: 4.2 Ratio (ref <=5.0)
VLDL: 25 mg/dL (ref <30)

## 2016-03-01 LAB — GLUCOSE, RANDOM: Glucose, Bld: 96 mg/dL (ref 65–99)

## 2016-03-01 MED ORDER — UMECLIDINIUM BROMIDE 62.5 MCG/INH IN AEPB: 1.0000 | INHALATION_SPRAY | Freq: Every day | RESPIRATORY_TRACT | Status: DC

## 2016-03-01 MED ORDER — IBANDRONATE SODIUM 150 MG PO TABS
ORAL_TABLET | ORAL | Status: DC
Start: 2016-03-01 — End: 2016-12-31

## 2016-03-01 NOTE — Progress Notes (Signed)
Chief Complaint  Patient presents with  . Hyperlipidemia    fasting med check.    Hyperlipidemia: at her last visit 6 months ago, she had been off her simvastatin for 2 months--her lipids were a little higher, but still acceptable. She was told to remain off the simvastatin, but continue to follow a lowfat, low cholesterol diet. Daily exercise and quitting smoking was recommended. She hasn't done either. Due for recheck today. She has been trying to follow a lowfat, low cholesterol diet.  Osteopenia--changed from fosamax (due to nausea) to Actonel last year, which she tolerated better, without any nausea.  There was then an insurance issue, she she had been off the medication, later changed to [facility]. She took it for 3 months--cost her $65 dollars and stated it bothered her stomach, so she stopped it (and didn't contact us). She couldn't recall if she got 1 or 3 pills, she initially said she got 3 pills, but looking back at prescriptions, the actonel was written for #3, but the boniva was written for #1).  She had acid problems--couldn't recall how long it lasted. She is willing to retry this, as the symptoms weren't too bad, if we can try and prevent these symptoms and if it is affordable. She had DEXA repeated in February (see results below), and told to continue this, along with calcium+D and weight bearing exercise (and tobacco cessation also recommended).  She takes a calcium +D supplement daily. She continues to smoke, and doesn't get any regular weight-bearing exercise (other than walking around carrying a heavy purse on her shoulder while shopping).   Last DEXA 12/2015: ASSESSMENT: The BMD measured at Femur Neck Right is 0.784 g/cm2 with a T-score of -1.8. This patient is considered to be osteopenic according to [facility]) criteria. Lumbar spine was not utilized due to advanced degenerative changes. There has been a statistically significant increase in BMD of Left hip  since prior exam dated 08/27/2013.  Vitamin D deficiency. Last level was 39 11/2013 on same supplements.  Tobacco use--She reports she would like to quit, "but it is hard".Her husband also smokes. She has shortness of breath when going up and down the stairs to do her laundry, and in carrying her groceries in (up a hill to her house). She uses albuterol as needed, with good results. Otherwise, has limited exercise, and therefore no significant shortness of breath. No problems when walking around stores, or other activity.  COPD: spirometry was done at last visit, showing severe airway obstruction, with low vital capacity. She was offered Spiriva (in letter sent, or similar med), but never called for it.  She is concerned about costs, but willing to try a sample of a medication.  Elevated blood pressure today:  She reports she had [employer] yesterday and tomato soup.  She normally tries to limit salt in her diet. Denies headaches or h/o high blood pressure  PMH, PSH, SH reviewed  Outpatient Encounter Prescriptions as of 03/01/2016  Medication Sig Note  . albuterol (PROVENTIL HFA;VENTOLIN HFA) 108 (90 BASE) MCG/ACT inhaler Inhale 2 puffs into the lungs every 6 (six) hours as needed for wheezing. 02/24/2015: Uses it once or twice a week, usually related to going up and down the stairs (doing laundry)  . Calcium-Vitamin D (CALTRATE 600 PLUS-VIT D PO) Take 1 each by mouth daily.   . Multiple Vitamins-Minerals (HAIR/SKIN/NAILS PO) Take 1 tablet by mouth daily.   Marland Kitchen ibandronate (BONIVA) 150 MG tablet Take in the morning with  a full glass of water, on an empty stomach, and do not take anything else by mouth or lie down for the next 30 min.   . simvastatin (ZOCOR) 20 MG tablet Take 1 tablet (20 mg total) by mouth every evening. (Patient not taking: Reported on 08/31/2015) 08/31/2015: Ran out 2 month ago.  . umeclidinium bromide (INCRUSE ELLIPTA) 62.5 MCG/INH AEPB Inhale 1 puff into the lungs daily.   .  [DISCONTINUED] ibandronate (BONIVA) 150 MG tablet Take in the morning with a full glass of water, on an empty stomach, and do not take anything else by mouth or lie down for the next 30 min. (Patient not taking: Reported on 03/01/2016)   . [DISCONTINUED] umeclidinium bromide (INCRUSE ELLIPTA) 62.5 MCG/INH AEPB Inhale 1 puff into the lungs daily.    No facility-administered encounter medications on file as of 03/01/2016.   ROS: denies fever, chills, headaches, allergy or URI symptoms. +chronic smoker's cough. Dyspnea as per HPI. No chest pain, palpitations, edema, nausea, vomiting, bowel changes, urinary complaints, bleeding, bruising, rash, depression or other complaints  PHYSICAL EXAM: BP 150/88 mmHg  Pulse 84  Ht 5\' 7"  (1.702 m)  Wt 157 lb 6.4 oz (71.396 kg)  BMI 24.65 kg/m2 120/74 on repeat by MD Well developed, pleasant female with low voice and occasional cough HEENT: PERRL, EOMI, conjunctiva and sclera clear Neck: no lymphadenopathy, thyromegaly or carotid bruit Heart: regular rate and rhythm Lungs: Decreased air movement, intermittent mild wheeze. No rales, ronchi Abdomen: soft, nontender, no organomegaly or mass Extremities: no edema, normal pulses Psych: normal mood, affect, hygiene and grooming Neuro: alert and oriented, cranial nerves intact, normal strength, gait  ASSESSMENT/PLAN:  Pure hypercholesterolemia - recheck today; if elevated, restart simvastatin - Plan: Lipid panel  Osteopenia - re-try Boniva, using Nexiuim samples starting the niht prior. Consider Prolia or Reclast if side effects or cost an issue. Quit smoking; wt bearing exercise - Plan: ibandronate (BONIVA) 150 MG tablet  Chronic obstructive pulmonary disease, unspecified COPD type ([place]) - trial of Incruse.  If no improvement, consider Anoro. currently uses albuterol prn only, not daily. Still has #75puffs left; will call when refill needed - Plan: umeclidinium bromide (INCRUSE ELLIPTA) 62.5 MCG/INH AEPB,  DISCONTINUED: umeclidinium bromide (INCRUSE ELLIPTA) 62.5 MCG/INH AEPB  Tobacco use disorder - counseled extensively re: cessation; info provided  Vitamin D deficiency - continue supplements  Impaired fasting glucose - borderline at 100 on more than 1 occasion.  recheck today - Plan: Glucose, random   Glu and lipids today.   Discussed Boniva--her concern of $65 for the prescription--unsure if this was for #1 or #3; she now understands that if it was for 3 months, she would continue/affordable. We discussed the acid reflux type side effects.  She is willing to retry the Boniva while trying to prevent these symptoms.  We discussed Prolia today--if she cannot tolerate the boniva, she should call us and we can let her know the cost of Prolia vs also consider Reclast. Given Nexium 24hr #4 samples.  COPD: given Incruse sample and free month voucher. Instructed on use. Change to Anoro if suboptimal response to Incruse.   F/u 6 months for CPE/AWV/med check (fasting).  45 min visit, more than 1/2 spent counseling.    The night prior to taking your Boniva, start either zantac 150mg  twice daily or prilosec OTC 20mg , once daily.  Use this for just the few days that you might be having acid reflux related to the Boniva. We gave you Nexium samples  today (enough for 4 days)--try these free samples prior to buying zantac or prilosec.  If helpful, this is also OTC. If you find that you still can't tolerate the boniva, and don't wish to continue it, then Prolia (injection every 6 months) or Reclast (IV infusion once yearly, of the same type of medication as the boniva/actonel/fosamax) would be other treatment options.  You expressed interest in Prolia, so we could start with that. We should be able to find out for you what your out of pocket cost would be.  The other option would be to retry the Actonel, which you tolerated, only stopped due to insurance reasons.  We might be able to get this authorized if  you cannot tolerate the boniva.  We would have to try and get authorization (not guaranteed)--let us know if this is what you would like for [place] to try.   Please try and get regular weight-bearing exercise (upper and lower body) as we discussed.  This helps keep your bones strong. Quitting smoking will also help your bones.   PLEASE talk to your husband and your insurance company about quitting smoking.  Insurance often times have programs to help (coaches, free patches, etc).  Also call 1-800-QUITNOW for assistance. We discussed quitting cold [place] and using nicotine replacement (patches, gum) and also tapering down, cutting down the amount you smoke by a cigarette every few days.  Your blood pressure was elevated today. Please limit the sodium in your diet (see below), get regular exercise, and periodically check your blood pressure at home.  Please write down your values on a piece of paper, and bring these to your next visit.  Return sooner than 6 months if your blood pressure is consistently over 140/90.  We are giving you a one week sample of Incruse.  Inhale this as directed on the box (and as shown today) once daily in the morning.  I think you can get a free sample with the voucher given, so a prescription was sent to your pharmacy. If this is helpful, then get the refills of this prescription.  If you don't see any improvement in your breathing with walking up the hills/driveway or stairs in your house, then call to try a different inhaler (Anoro), and if you were able to get a free Incruse you should also be able to get a free month of this other inhaler.   Have the pharmacy contact us when you need more albuterol.  You may continue to use the albuterol as needed.

## 2016-03-01 NOTE — Patient Instructions (Addendum)
The night prior to taking your Boniva, start either zantac 150mg  twice daily or prilosec OTC 20mg , once daily.  Use this for just the few days that you might be having acid reflux related to the Boniva. We gave you Nexium samples today (enough for 4 days)--try these free samples prior to buying zantac or prilosec.  If helpful, this is also OTC. If you find that you still can't tolerate the boniva, and don't wish to continue it, then Prolia (injection every 6 months) or Reclast (IV infusion once yearly, of the same type of medication as the boniva/actonel/fosamax) would be other treatment options.  You expressed interest in Prolia, so we could start with that. We should be able to find out for you what your out of pocket cost would be.  The other option would be to retry the Actonel, which you tolerated, only stopped due to insurance reasons.  We might be able to get this authorized if you cannot tolerate the boniva.  We would have to try and get authorization (not guaranteed)--let us know if this is what you would like for [place] to try.   Please try and get regular weight-bearing exercise (upper and lower body) as we discussed.  This helps keep your bones strong. Quitting smoking will also help your bones.   PLEASE talk to your husband and your insurance company about quitting smoking.  Insurance often times have programs to help (coaches, free patches, etc).  Also call 1-800-QUITNOW for assistance. We discussed quitting cold [place] and using nicotine replacement (patches, gum) and also tapering down, cutting down the amount you smoke by a cigarette every few days.   We are giving you a one week sample of Incruse.  Inhale this as directed on the box (and as shown today) once daily in the morning.  I think you can get a free sample with the voucher given, so a prescription was sent to your pharmacy. If this is helpful, then get the refills of this prescription.  If you don't see any improvement in your  breathing with walking up the hills/driveway or stairs in your house, then call to try a different inhaler (Anoro), and if you were able to get a free Incruse you should also be able to get a free month of this other inhaler.   Have the pharmacy contact us when you need more albuterol.  You may continue to use the albuterol as needed.    Your blood pressure was elevated today. Please limit the sodium in your diet (see below), get regular exercise, and periodically check your blood pressure at home.  Please write down your values on a piece of paper, and bring these to your next visit.  Return sooner than 6 months if your blood pressure is consistently over 140/90.    Low-Sodium Eating Plan Sodium raises blood pressure and causes water to be held in the body. Getting less sodium from food will help lower your blood pressure, reduce any swelling, and protect your heart, liver, and kidneys. We get sodium by adding salt (sodium chloride) to food. Most of our sodium comes from canned, boxed, and frozen foods. Restaurant foods, fast foods, and pizza are also very high in sodium. Even if you take medicine to lower your blood pressure or to reduce fluid in your body, getting less sodium from your food is important. WHAT IS MY PLAN? Most people should limit their sodium intake to 2,300 mg a day. Your health care provider recommends that you  limit your sodium intake to __________ a day.  WHAT DO I NEED TO KNOW ABOUT THIS EATING PLAN? For the low-sodium eating plan, you will follow these general guidelines:  Choose foods with a % Daily Value for sodium of less than 5% (as listed on the food label).   Use salt-free seasonings or herbs instead of table salt or sea salt.   Check with your health care provider or pharmacist before using salt substitutes.   Eat fresh foods.  Eat more vegetables and fruits.  Limit canned vegetables. If you do use them, rinse them well to decrease the sodium.   Limit  cheese to 1 oz (28 g) per day.   Eat lower-sodium products, often labeled as "lower sodium" or "no salt added."  Avoid foods that contain monosodium glutamate (MSG). MSG is sometimes added to [place] food and some canned foods.  Check food labels (Nutrition Facts labels) on foods to learn how much sodium is in one serving.  Eat more home-cooked food and less restaurant, buffet, and fast food.  When eating at a restaurant, ask that your food be prepared with less salt, or no salt if possible.  HOW DO I READ FOOD LABELS FOR SODIUM INFORMATION? The Nutrition Facts label lists the amount of sodium in one serving of the food. If you eat more than one serving, you must multiply the listed amount of sodium by the number of servings. Food labels may also identify foods as:  Sodium free--Less than 5 mg in a serving.  Very low sodium--35 mg or less in a serving.  Low sodium--140 mg or less in a serving.  Light in sodium--50% less sodium in a serving. For example, if a food that usually has 300 mg of sodium is changed to become light in sodium, it will have 150 mg of sodium.  Reduced sodium--25% less sodium in a serving. For example, if a food that usually has 400 mg of sodium is changed to reduced sodium, it will have 300 mg of sodium. WHAT FOODS CAN I EAT? Grains Low-sodium cereals, including oats, puffed wheat and rice, and shredded wheat cereals. Low-sodium crackers. Unsalted rice and pasta. Lower-sodium bread.  Vegetables Frozen or fresh vegetables. Low-sodium or reduced-sodium canned vegetables. Low-sodium or reduced-sodium tomato sauce and paste. Low-sodium or reduced-sodium tomato and vegetable juices.  Fruits Fresh, frozen, and canned fruit. Fruit juice.  Meat and Other Protein Products Low-sodium canned tuna and salmon. Fresh or frozen meat, poultry, seafood, and fish. Lamb. Unsalted nuts. Dried beans, peas, and lentils without added salt. Unsalted canned beans. Homemade  soups without salt. Eggs.  Dairy Milk. Soy milk. Ricotta cheese. Low-sodium or reduced-sodium cheeses. Yogurt.  Condiments Fresh and dried herbs and spices. Salt-free seasonings. Onion and garlic powders. Low-sodium varieties of mustard and ketchup. Fresh or refrigerated horseradish. Lemon juice.  Fats and Oils Reduced-sodium salad dressings. Unsalted butter.  Other Unsalted popcorn and pretzels.  The items listed above may not be a complete list of recommended foods or beverages. Contact your dietitian for more options. WHAT FOODS ARE NOT RECOMMENDED? Grains Instant hot cereals. Bread stuffing, pancake, and biscuit mixes. Croutons. Seasoned rice or pasta mixes. Noodle soup cups. Boxed or frozen macaroni and cheese. Self-rising flour. Regular salted crackers. Vegetables Regular canned vegetables. Regular canned tomato sauce and paste. Regular tomato and vegetable juices. Frozen vegetables in sauces. Salted [place] fries. Olives. Angie Fava. Relishes. Sauerkraut. Salsa. Meat and Other Protein Products Salted, canned, smoked, spiced, or pickled meats, seafood, or fish. Berniece Salines, ham,  sausage, hot dogs, corned beef, chipped beef, and packaged luncheon meats. Salt pork. Jerky. Pickled herring. Anchovies, regular canned tuna, and sardines. Salted nuts. Dairy Processed cheese and cheese spreads. Cheese curds. Blue cheese and cottage cheese. Buttermilk.  Condiments Onion and garlic salt, seasoned salt, table salt, and sea salt. Canned and packaged gravies. Worcestershire sauce. Tartar sauce. Barbecue sauce. Teriyaki sauce. Soy sauce, including reduced sodium. Steak sauce. Fish sauce. Oyster sauce. Cocktail sauce. Horseradish that you find on the shelf. Regular ketchup and mustard. Meat flavorings and tenderizers. Bouillon cubes. Hot sauce. Tabasco sauce. Marinades. Taco seasonings. Relishes. Fats and Oils Regular salad dressings. Salted butter. Margarine. Ghee. Bacon fat.  Other Potato and  tortilla chips. Corn chips and puffs. Salted popcorn and pretzels. Canned or dried soups. Pizza. Frozen entrees and pot pies.  The items listed above may not be a complete list of foods and beverages to avoid. Contact your dietitian for more information.   This information is not intended to replace advice given to you by your health care provider. Make sure you discuss any questions you have with your health care provider.   Document Released: 05/04/2002 Document Revised: 12/03/2014 Document Reviewed: 09/16/2013 Elsevier Interactive Patient Education [employer].

## 2016-03-02 ENCOUNTER — Encounter: Payer: Self-pay | Admitting: Family Medicine

## 2016-03-05 DIAGNOSIS — H40013 Open angle with borderline findings, low risk, bilateral: Secondary | ICD-10-CM | POA: Diagnosis not present

## 2016-10-05 DIAGNOSIS — H40013 Open angle with borderline findings, low risk, bilateral: Secondary | ICD-10-CM | POA: Diagnosis not present

## 2016-10-30 DIAGNOSIS — D472 Monoclonal gammopathy: Secondary | ICD-10-CM | POA: Insufficient documentation

## 2016-10-30 NOTE — Progress Notes (Signed)
Chief Complaint  Patient presents with  . Medicare Wellness    fasting (has coffee with cream) AWV with pelvic. Did not do eye exam, she has an appt next week. No concerns.    Tiffany [name] is a 70 y.o. female who presents for annual physical exam, Medicare wellness visit and follow-up on chronic medical conditions.  She has the following concerns:  Hyperlipidemia: At her physical last year, she had been off her simvastatin for 2 months--her lipids were a little higher, but still acceptable. She was told to remain off the simvastatin, but continue to follow a lowfat, low cholesterol diet. Daily exercise and quitting smoking was recommended, which she hadn't done.  Her repeat lipids at last visit remained stable, so she was kept off simvastatin.  Due for recheck today. Lab Results  Component Value Date   CHOL 208 (H) 03/01/2016   HDL 50 03/01/2016   LDLCALC 133 (H) 03/01/2016   TRIG 123 03/01/2016   CHOLHDL 4.2 03/01/2016    Osteopenia--changed from fosamax (due to nausea) to Actonel last year, which she tolerated better, without any nausea.  There was then an insurance issue, she she had been off the medication, later changed to [facility]. She took it for 3 months--cost her $65 dollars and stated it bothered her stomach, so she stopped it (and didn't contact us). She had acid problems--couldn't recall how long it lasted. At her last visit in April, she reported she was willing to retry Boniva, and recommended she use Nexium if needed. When she went to get the prescription, she was told her insurance didn't cover it, so she never restarted it. She didn't contact her insurance company or [place]. She takes a calcium +D supplement daily. She continues to smoke, and doesn't get any regular weight-bearing exercise (other than walking around carrying a heavy purse on her shoulder while shopping).   Last DEXA 12/2015: ASSESSMENT: The BMD measured at Femur Neck Right is 0.784 g/cm2 with a T-score of  -1.8. This patient is considered to be osteopenic according to [facility]) criteria. Lumbar spine was not utilized due to advanced degenerative changes. There has been a statistically significant increase in BMD of Left hip since prior exam dated 08/27/2013.  Vitamin D deficiency. Last level was 39 11/2013 on same supplements.  Tobacco use--She continues to smoke 1.5PPD; husband also smokes. She has shortness of breath when going up and down the stairs to do her laundry, and in carrying her groceries in (up a hill to her house). She uses albuterol as needed, with good results. Otherwise, has limited exercise, and therefore no significant shortness of breath. No problems when walking around stores, or other activity.  COPD: Spirometry last year showed severe airway obstruction, with low vital capacity. She was offered Spiriva (in letter sent, or similar med), but never called for it. At her last visit in April, she was prescribed Incruse. She was given a sample, but states her insurance never covered the prescription. When taking the sample, she didn't need to use the albuterol as often.  Needing refill of albuterol.   Immunization History  Administered Date(s) Administered  . Influenza Split 10/16/2012  . Influenza, High Dose Seasonal PF 09/02/2013, 08/25/2014, 08/31/2015  . Pneumococcal Conjugate-13 08/25/2014  . Pneumococcal Polysaccharide-23 12/12/2009, 08/31/2015  . Tdap 12/12/2009   Last Pap smear: s/p hyst in 20's  Last mammogram: 12/2015 Last colonoscopy: 11/2010 Dr. Wynetta Emery. She got a letter last year stating she was due, but didn't go.  Last DEXA: 12/2015 Dentist: doesn't go, has dentures  Ophtho: recent, sees specialist for "wrinkled retina" Exercise: Walks around [place] and the mall for shopping/exercise (no sustained aerobic activity, no weight-bearing exercise).  Other doctors caring for patient include: Dermatologist:  Dr. Allyson Sabal GI:  Dr.  Wynetta Emery Ophtho: Dr. Wynetta Emery ophtho (specialist): Dr. Edilia Bo  Depression, Fall and Functional status screening performed--see epic questionnaire. Notable only for trouble with stairs due to dyspnea.   End of Life Discussion:  Patient does not have a living will and medical power of attorney. Paperwork was again given.  Past Medical History:  Diagnosis Date  . Abnormal PFT 09/02/14   moderate restrictive ventilatory defect.  Marland Kitchen BCC (basal cell carcinoma), face 2010   Dr. Allyson Sabal  . Colon polyp 2/05  . Diverticulosis 11/2010   seen on colonoscopy and air contrast BE  . Hyperlipidemia   . Hypertension    previously treated with meds, resolved  . MGUS (monoclonal gammopathy of unknown significance) 1999   prevously under care of Dr. Learta Codding  . Osteopenia    [facility] 01/31/2010 at [facility]; 2014 at [facility]  . 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 Tobacco abuse     Past Surgical History:  Procedure Laterality Date  . BASAL CELL CARCINOMA EXCISION  2010   L face, R temple  . BILATERAL SALPINGOOPHORECTOMY  2007   cancerous tumor R ovary  . CATARACT EXTRACTION  2009   bilateral  . COLONOSCOPY  11/2010   Dr. Wynetta Emery. Diverticulosis. BE also done--normal  . OMENTECTOMY  2007   and pelvic lymphadenectomy (with BSO)  . TONSILLECTOMY  age 33  . VAGINAL HYSTERECTOMY  late 20's    Social History   Social History  . Marital status: Married    Spouse name: N/A  . Number of children: 1  . Years of education: N/A   Occupational History  . retired Manufacturing systems engineer) Retired   Social History Main Topics  . Smoking status: Current Every Day Smoker    Packs/day: 1.50    Years: 50.00  . Smokeless tobacco: Never Used  . Alcohol use 0.0 oz/week     Comment: maybe 1-2 times per year.  . Drug use: No  . Sexual activity: Yes    Partners: Male   Other Topics Concern  . Not on file   Social History Narrative   Lives with husband, no pets.  Son lives in [place].  1 grandson     Family History  Problem Relation Age of Onset  . Stroke Mother   . Hypertension Father   . Suicidality Father   . Diabetes Maternal Aunt   . Diabetes Maternal Aunt   . Diabetes Maternal Aunt   . Throat cancer Maternal Uncle   . Breast cancer Maternal Aunt     2 aunts  . Lung cancer Maternal Uncle     Outpatient Encounter Prescriptions as of 11/01/2016  Medication Sig Note  . albuterol (PROVENTIL HFA;VENTOLIN HFA) 108 (90 BASE) MCG/ACT inhaler Inhale 2 puffs into the lungs every 6 (six) hours as needed for wheezing. 02/24/2015: Uses it once or twice a week, usually related to going up and down the stairs (doing laundry)  . Calcium-Vitamin D (CALTRATE 600 PLUS-VIT D PO) Take 1 each by mouth daily.   Marland Kitchen ibandronate (BONIVA) 150 MG tablet Take in the morning with a full glass of water, on an empty stomach, and do not take anything else by mouth or  lie down for the next 30 min. (Patient not taking: Reported on 11/01/2016) 11/01/2016: Wasn't covered by insurance, hasn't taken in well over 6 months  . Multiple Vitamins-Minerals (HAIR/SKIN/NAILS PO) Take 1 tablet by mouth daily. 11/01/2016: Takes only sporadically  . [DISCONTINUED] simvastatin (ZOCOR) 20 MG tablet Take 1 tablet (20 mg total) by mouth every evening. (Patient not taking: Reported on 11/01/2016) 08/31/2015: Ran out 2 month ago.  . [DISCONTINUED] umeclidinium bromide (INCRUSE ELLIPTA) 62.5 MCG/INH AEPB Inhale 1 puff into the lungs daily.    No facility-administered encounter medications on file as of 11/01/2016.     No Known Allergies   ROS: The patient denies anorexia, fever, weight changes (slight loss), headaches, vision changes, decreased hearing, ear pain, sore throat, breast concerns, chest pain, dizziness, syncope, swelling, nausea, vomiting, diarrhea, constipation, abdominal pain, melena, hematochezia, hematuria, incontinence, dysuria, vaginal bleeding, discharge, odor or itch, genital lesions, joint pains (just  intermittent L knee pain), weakness, tremor, suspicious skin lesions, depression, anxiety, abnormal bleeding/bruising, or enlarged lymph nodes. Occasional night sweats, no hot flashes during the day  Some vaginal dryness  SOB only with stairs and carrying groceries uphill, per HPI. Some smoker's cough -- thick, clear phlegm. She had a "severe case of heartburn" on Tuesday (after eating sausage biscuits). It went up into her jaw, but was relieved by Rolaids (required a lot).  No problems since then. Usually has heartburn with certain foods, such as pizza.   PHYSICAL EXAM:  BP 122/86 (BP Location: Right Arm, Patient Position: Sitting, Cuff Size: Normal)   Pulse 84   Ht 5' 6.25" (1.683 m)   Wt 153 lb 12.8 oz (69.8 kg)   BMI 24.64 kg/m    General Appearance:  Alert, cooperative, no distress, appears stated age. Speaks easily in full sentences, in no distress  Head:  Normocephalic, without obvious abnormality, atraumatic   Eyes:  PERRL, conjunctiva/corneas clear, EOM's intact, fundi benign   Ears:  Normal TM's and external ear canals   Nose:  Nares normal, mucosa normal, no drainage or sinus tenderness   Throat:  Lips, visualized mucosa, and tongue normal; upper and lower dentures in place, limiting full visibility of oropharynx  Neck:  Supple, no lymphadenopathy; thyroid: no enlargement/tenderness/nodules; no carotid bruit or JVD   Back:  Spine nontender, no curvature, ROM normal, no CVA tenderness   Lungs:  Diminished air movement with both inspiratory and expiratory wheezes noted.  No prolonged expiratory phase or any evidence of distress. No rales, ronchi.  Chest Wall:  No tenderness or deformity   Heart:  Regular rate and rhythm, S1 and S2 normal, no murmur, rub  or gallop   Breast Exam:  No tenderness, masses, or nipple discharge or inversion. No axillary lymphadenopathy   Abdomen:  Soft, non-tender, nondistended, normoactive bowel sounds,  no masses, no  hepatosplenomegaly   Genitalia:  Normal external genitalia without lesions. BUS and vagina normal, some atrophic changes; No abnormal vaginal discharge. Uterus absent. No adnexal masses or tenderness. Pap not performed   Rectal:  Normal tone, no masses or tenderness; guaiac negative stool   Extremities:  No clubbing, cyanosis or edema   Pulses:  2+ and symmetric all extremities   Skin:  Turgor normal, no rashes. Significant actinic changes to skin.ecchymoses and purpura on both forearms, thin skin. Very dry skin on lower legs. Large dry erythematous plaque right wrist  Lymph nodes:  Cervical, supraclavicular, and axillary nodes normal   Neurologic:  CNII-XII intact, normal strength, sensation and gait; reflexes  2+ and symmetric throughout   Psych:  Normal mood, affect, hygiene and grooming   ASSESSMENT/PLAN:  Annual physical exam - Plan: Comprehensive metabolic panel, Lipid panel, CBC with Differential/Platelet, VITAMIN D 25 Hydroxy (Vit-D Deficiency, Fractures), TSH, POCT Urinalysis Dipstick  Medicare annual wellness visit, subsequent  Chronic obstructive pulmonary disease, unspecified COPD type ([place]) - severe; continues to smoke, only using prn albuterol 2-3 times/week due to limited activity. Needs daily meds, await insurance preferences (pt to call) - Plan: Spirometry with Graph, albuterol (PROVENTIL HFA;VENTOLIN HFA) 108 (90 Base) MCG/ACT inhaler, ECHOCARDIOGRAM COMPLETE  Osteopenia, unspecified location - discussed Ca, D, weight-bearing exercise, quitting smoking, and using bisphosphonate that is covered by insurance (pt to check)  Tobacco use disorder  Pure hypercholesterolemia - Plan: Lipid panel  Vitamin D deficiency - Plan: VITAMIN D 25 Hydroxy (Vit-D Deficiency, Fractures)  MGUS (monoclonal gammopathy of unknown significance) - Plan: Multiple Myeloma Panel (SPEP&IFE w/QIG)  Need for prophylactic vaccination and inoculation against influenza  - Plan: Flu vaccine HIGH DOSE PF (Fluzone High dose)  Medication monitoring encounter - Plan: Comprehensive metabolic panel, Lipid panel, CBC with Differential/Platelet, VITAMIN D 25 Hydroxy (Vit-D Deficiency, Fractures)  Loss of weight - Plan: TSH  Biatrial enlargement - Plan: EKG 12-Lead, ECHOCARDIOGRAM COMPLETE   Spirometry today--Severe airway obstruction, with low vital capacity. Needs to be started on daily meds--whether it be Spiriva, incruse. LABA/SABA combo, or other.   She needs to check with her insurance to see what is preferred.  Strongly encouraged her to quit smoking, risks reviewed. Will suggest lung cancer screening program ([place])  She was reminded again of the importance of contacting us when a medication is not covered, so that we can change to an alternative.  This time she did it both with the bisphosphonate and her COPD med.  EKG today due to the severe episode of chest pain which she attributed to reflux. We have no baseline EKG available for her.  It showed biatrial enlargement, poss anteroseptal infarct, age undetermined. No acute findings Echo recommended; may also need stress test, start with echo.  SPEP & IFE with QIG c-met, lipid, CBC, TSH, Vit D  Discussed monthly self breast exams and yearly mammograms; at least 30 minutes of aerobic activity at least 5 days/week, weight-bearing exercise 2x/wk; proper sunscreen use reviewed; healthy diet, including goals of calcium and vitamin D intake and alcohol recommendations (less than or equal to 1 drink/day) reviewed; regular seatbelt use; changing batteries in smoke detectors. Immunization recommendations discussed--high dose flu shot given. Shingles vaccination recommended.  Previously discussed Zostavax, but she hasn't gotten yet. I recommend she get the shingrix, when available, instead. Colonoscopy recommendations reviewed. She states she got a letter saying she was due, but didn't schedule. She is NOT interested in  [place], would rather have a repeat colonoscopy if needed.   Full Code, Full care. Doesn't want prolonged measures.  Living Will and healthcare POA handouts given, encouraged her to discuss, fill out, and give [place] a copy.   Recommended seeing dentist for oral cancer screening exam  Counseled re: refllux OTC treatments, foods to avoid, quit smoking.  Counseled re: smoking cessation.  It is very important that if we prescribe a medication for you (ie Boniva, Incruse, etc) and it isn't covered by your insurance, you need to #1--let us know (rather than [place] thinking you're using it and you aren't), and #2--contact your insurance to see what alternatives ARE covered, so that we can change the prescription.  Please contact Dr. Durenda Age  office to schedule colonoscopy, if they feel that you are due for this (you stated you got a letter saying you were last year).  Please check with your insurance to see if either Actonel or Boniva are covered.  These are the once a month medications (that you take on an empty stomach, with full glass of water, and don't eat or lay down for at least 30 minutes).  If it bothers your stomach, you should take a nexium the night before taking it, and use it once daily for the days that your stomach is bothered (you may need it for 1-3 days). If your stomach is very bothersome, and the nexium doesn't hellp, contact us, rather than stopping it without letting us know.  I would also like to know which medications your insurance will cover for your COPD.  Anoro, Incruse, Spiriva, etc There are a lot of options. If you get a book with your covered medications, consider sending me copies of the lung medications/inhalers.   Medicare Attestation I have personally reviewed: The patient's medical and social history Their use of alcohol, tobacco or illicit drugs Their current medications and supplements The patient's functional ability including ADLs,fall risks, home safety  risks, cognitive, and hearing and visual impairment Diet and physical activities Evidence for depression or mood disorders  The patient's weight, height, and BMI have been recorded in the chart.  I have made referrals, counseling, and provided education to the patient based on review of the above and I have provided the patient with a written personalized care plan for preventive services.     , A, MD   10/30/2016

## 2016-11-01 ENCOUNTER — Other Ambulatory Visit: Payer: Self-pay | Admitting: Family Medicine

## 2016-11-01 ENCOUNTER — Other Ambulatory Visit: Payer: Self-pay | Admitting: *Deleted

## 2016-11-01 ENCOUNTER — Ambulatory Visit (INDEPENDENT_AMBULATORY_CARE_PROVIDER_SITE_OTHER): Payer: Medicare Other | Admitting: Family Medicine

## 2016-11-01 ENCOUNTER — Encounter: Payer: Self-pay | Admitting: Family Medicine

## 2016-11-01 VITALS — BP 122/86 | HR 84 | Ht 66.25 in | Wt 153.8 lb

## 2016-11-01 DIAGNOSIS — M8580 Other specified disorders of bone density and structure, unspecified site: Secondary | ICD-10-CM

## 2016-11-01 DIAGNOSIS — Z5181 Encounter for therapeutic drug level monitoring: Secondary | ICD-10-CM | POA: Diagnosis not present

## 2016-11-01 DIAGNOSIS — J449 Chronic obstructive pulmonary disease, unspecified: Secondary | ICD-10-CM

## 2016-11-01 DIAGNOSIS — D472 Monoclonal gammopathy: Secondary | ICD-10-CM

## 2016-11-01 DIAGNOSIS — E7800 Pure hypercholesterolemia, unspecified: Secondary | ICD-10-CM | POA: Diagnosis not present

## 2016-11-01 DIAGNOSIS — I517 Cardiomegaly: Secondary | ICD-10-CM | POA: Diagnosis not present

## 2016-11-01 DIAGNOSIS — E559 Vitamin D deficiency, unspecified: Secondary | ICD-10-CM | POA: Diagnosis not present

## 2016-11-01 DIAGNOSIS — R634 Abnormal weight loss: Secondary | ICD-10-CM | POA: Diagnosis not present

## 2016-11-01 DIAGNOSIS — Z0000 Encounter for general adult medical examination without abnormal findings: Secondary | ICD-10-CM

## 2016-11-01 DIAGNOSIS — Z23 Encounter for immunization: Secondary | ICD-10-CM | POA: Diagnosis not present

## 2016-11-01 DIAGNOSIS — F17200 Nicotine dependence, unspecified, uncomplicated: Secondary | ICD-10-CM

## 2016-11-01 LAB — CBC WITH DIFFERENTIAL/PLATELET
BASOS PCT: 1 %
Basophils Absolute: 58 cells/uL (ref 0–200)
Eosinophils Absolute: 116 cells/uL (ref 15–500)
Eosinophils Relative: 2 %
HEMATOCRIT: 48.7 % — AB (ref 35.0–45.0)
HEMOGLOBIN: 16.6 g/dL — AB (ref 11.7–15.5)
LYMPHS ABS: 1798 {cells}/uL (ref 850–3900)
Lymphocytes Relative: 31 %
MCH: 32.1 pg (ref 27.0–33.0)
MCHC: 34.1 g/dL (ref 32.0–36.0)
MCV: 94.2 fL (ref 80.0–100.0)
MONO ABS: 464 {cells}/uL (ref 200–950)
MPV: 11.2 fL (ref 7.5–12.5)
Monocytes Relative: 8 %
NEUTROS ABS: 3364 {cells}/uL (ref 1500–7800)
NEUTROS PCT: 58 %
Platelets: 237 10*3/uL (ref 140–400)
RBC: 5.17 MIL/uL — AB (ref 3.80–5.10)
RDW: 13.3 % (ref 11.0–15.0)
WBC: 5.8 10*3/uL (ref 4.0–10.5)

## 2016-11-01 LAB — COMPREHENSIVE METABOLIC PANEL
ALK PHOS: 105 U/L (ref 33–130)
ALT: 12 U/L (ref 6–29)
AST: 19 U/L (ref 10–35)
Albumin: 4.4 g/dL (ref 3.6–5.1)
BILIRUBIN TOTAL: 0.7 mg/dL (ref 0.2–1.2)
BUN: 11 mg/dL (ref 7–25)
CALCIUM: 9.6 mg/dL (ref 8.6–10.4)
CO2: 28 mmol/L (ref 20–31)
CREATININE: 0.85 mg/dL (ref 0.50–0.99)
Chloride: 97 mmol/L — ABNORMAL LOW (ref 98–110)
GLUCOSE: 93 mg/dL (ref 65–99)
Potassium: 4.7 mmol/L (ref 3.5–5.3)
Sodium: 136 mmol/L (ref 135–146)
Total Protein: 7.9 g/dL (ref 6.1–8.1)

## 2016-11-01 LAB — POCT URINALYSIS DIPSTICK
Bilirubin, UA: NEGATIVE
Glucose, UA: NEGATIVE
Ketones, UA: NEGATIVE
LEUKOCYTES UA: NEGATIVE
NITRITE UA: NEGATIVE
PH UA: 6
PROTEIN UA: NEGATIVE
RBC UA: NEGATIVE
Spec Grav, UA: 1.025
UROBILINOGEN UA: NEGATIVE

## 2016-11-01 LAB — LIPID PANEL
Cholesterol: 213 mg/dL — ABNORMAL HIGH (ref <200)
HDL: 58 mg/dL (ref >50)
LDL Cholesterol: 131 mg/dL — ABNORMAL HIGH (ref <100)
Total CHOL/HDL Ratio: 3.7 Ratio (ref <5.0)
Triglycerides: 118 mg/dL (ref <150)
VLDL: 24 mg/dL (ref <30)

## 2016-11-01 LAB — TSH: TSH: 4.75 m[IU]/L — AB

## 2016-11-01 MED ORDER — ALBUTEROL SULFATE HFA 108 (90 BASE) MCG/ACT IN AERS: 2.0000 | INHALATION_SPRAY | Freq: Four times a day (QID) | RESPIRATORY_TRACT | 2 refills | Status: DC | PRN

## 2016-11-01 NOTE — Patient Instructions (Addendum)
HEALTH MAINTENANCE RECOMMENDATIONS:  It is recommended that you get at least 30 minutes of aerobic exercise at least 5 days/week (for weight loss, you may need as much as 60-90 minutes). This can be any activity that gets your heart rate up. This can be divided in 10-15 minute intervals if needed, but try and build up your endurance at least once a week.  Weight bearing exercise is also recommended twice weekly.  Eat a healthy diet with lots of vegetables, fruits and fiber.  "Colorful" foods have a lot of vitamins (ie green vegetables, tomatoes, red peppers, etc).  Limit sweet tea, regular sodas and alcoholic beverages, all of which has a lot of calories and sugar.  Up to 1 alcoholic drink daily may be beneficial for women (unless trying to lose weight, watch sugars).  Drink a lot of water.  Calcium recommendations are 1200-1500 mg daily (1500 mg for postmenopausal women or women without ovaries), and vitamin D 1000 IU daily.  This should be obtained from diet and/or supplements (vitamins), and calcium should not be taken all at once, but in divided doses.  Monthly self breast exams and yearly mammograms for women over the age of 54 is recommended.  Sunscreen of at least SPF 30 should be used on all sun-exposed parts of the skin when outside between the hours of 10 am and 4 pm (not just when at beach or pool, but even with exercise, golf, tennis, and yard work!)  Use a sunscreen that says "broad spectrum" so it covers both UVA and UVB rays, and make sure to reapply every 1-2 hours.  Remember to change the batteries in your smoke detectors when changing your clock times in the spring and fall.  Use your seat belt every time you are in a car, and please drive safely and not be distracted with cell phones and texting while driving.   Tiffany [name] , Thank you for taking time to come for your Medicare Wellness Visit. I appreciate your ongoing commitment to your health goals. Please review the following  plan we discussed and let me know if I can assist you in the future.   These are the goals we discussed: Goals    None      This is a list of the screening recommended for you and due dates:  Health Maintenance  Topic Date Due  . Shingles Vaccine  08/21/2007  . Flu Shot  06/26/2016  . Mammogram  01/08/2018  . Tetanus Vaccine  12/13/2019  . Colon Cancer Screening  11/28/2020  . DEXA scan (bone density measurement)  Completed  .  Hepatitis C: One time screening is recommended by Center for Disease Control  (CDC) for  adults born from [birth year] through 1964.   Completed  . Pneumonia vaccines  Completed   I highly recommend that you get the new shingle vaccine when it is available (Shingrix). You will need to check with your insurance to see if they cover it, and if you need to get it from the pharmacy or from our office (you need to get it from the pharmacy if it is covered by the Part D portion of your Medicare). It is not yet available--should be within the next couple of months.  You got your flu shot today. Mammogram is due 12/2016 (not 2019 as stated above) Your colonoscopy may have been due in 2017 (5 years from the last) and not necessarily 2022 as stated above.  Check with Dr. Durenda Age office. Your next  bone density will be due in 2019.  We need to get you back on bone building medications before then.  Please try and quit smoking--start thinking about why/when you smoke (habit, boredom, stress) in order to come up with effective strategies to cut back or quit. Available resources to help you quit include free counseling through [facility] Quitline ([place]Quitline.[facility] or 1-800-QUITNOW), smoking cessation classes through [facility] (call to find out schedule), over-the-counter nicotine replacements, and e-cigarettes (although this may not help break the hand-mouth habit).  Many insurance companies also have smoking cessation programs (which may decrease the cost of patches, meds  if enrolled).  If these methods are not effective for you, and you are motivated to quit, return to discuss the possibility of prescription medications.  I recommend seeing dentist for oral cancer screening exam.   It is very important that if we prescribe a medication for you (ie Boniva, Incruse, etc) and it isn't covered by your insurance, you need to #1--let us know (rather than [place] thinking you're using it and you aren't), and #2--contact your insurance to see what alternatives ARE covered, so that we can change the prescription.  Please contact Dr. Durenda Age office to schedule colonoscopy, if they feel that you are due for this (you stated you got a letter saying you were last year).  Please check with your insurance to see if either Actonel or Boniva are covered.  These are the once a month medications (that you take on an empty stomach, with full glass of water, and don't eat or lay down for at least 30 minutes).  If it bothers your stomach, you should take a nexium the night before taking it, and use it once daily for the days that your stomach is bothered (you may need it for 1-3 days). If your stomach is very bothersome, and the nexium doesn't hellp, contact us, rather than stopping it without letting us know.  I would also like to know which medications your insurance will cover for your COPD.  Anoro, Incruse, Spiriva, etc There are a lot of options. If you get a book with your covered medications, consider sending me copies of the lung medications/inhalers.  Schedule an appointment with Dr. Allyson Sabal to evaluate your skin--I think the lesion on the right wrist needs to be treated.

## 2016-11-02 LAB — VITAMIN D 25 HYDROXY (VIT D DEFICIENCY, FRACTURES): VIT D 25 HYDROXY: 30 ng/mL (ref 30–100)

## 2016-11-05 ENCOUNTER — Telehealth: Payer: Self-pay

## 2016-11-05 ENCOUNTER — Other Ambulatory Visit: Payer: Self-pay | Admitting: *Deleted

## 2016-11-05 DIAGNOSIS — F17210 Nicotine dependence, cigarettes, uncomplicated: Secondary | ICD-10-CM

## 2016-11-05 LAB — PROTEIN,TOTAL AND SPE, WITH SCAN
ALPHA-1-GLOBULIN: 0.4 g/dL — AB (ref 0.2–0.3)
ALPHA-2-GLOBULIN: 0.8 g/dL (ref 0.5–0.9)
Albumin ELP: 4.2 g/dL (ref 3.8–4.8)
Beta 2: 0.5 g/dL (ref 0.2–0.5)
Beta Globulin: 0.4 g/dL (ref 0.4–0.6)
Gamma Globulin: 1.5 g/dL (ref 0.8–1.7)
Total Protein, Serum Electrophoresis: 7.9 g/dL (ref 6.1–8.1)

## 2016-11-05 LAB — IMMUNOFIXATION ELECTROPHORESIS
IgA: 324 mg/dL (ref 81–463)
IgG (Immunoglobin G), Serum: 1535 mg/dL (ref 694–1618)
IgM, Serum: 417 mg/dL — ABNORMAL HIGH (ref 48–271)

## 2016-11-05 NOTE — Telephone Encounter (Signed)
Pt calling to see if you have scheduled heart doctor appt yet. Please call back at [phone]. Tiffany [name], RLB

## 2016-11-05 NOTE — Telephone Encounter (Signed)
Scheduled for 11/07/16, called patient and let her know.

## 2016-11-06 LAB — T4, FREE: FREE T4: 1.1 ng/dL (ref 0.8–1.8)

## 2016-11-07 ENCOUNTER — Other Ambulatory Visit (HOSPITAL_COMMUNITY): Payer: Medicare Other

## 2016-11-07 DIAGNOSIS — H35372 Puckering of macula, left eye: Secondary | ICD-10-CM | POA: Diagnosis not present

## 2016-11-07 DIAGNOSIS — Z961 Presence of intraocular lens: Secondary | ICD-10-CM | POA: Diagnosis not present

## 2016-11-07 DIAGNOSIS — H40013 Open angle with borderline findings, low risk, bilateral: Secondary | ICD-10-CM | POA: Diagnosis not present

## 2016-11-12 ENCOUNTER — Encounter: Payer: Self-pay | Admitting: Family Medicine

## 2016-11-27 ENCOUNTER — Ambulatory Visit (HOSPITAL_COMMUNITY): Payer: Medicare Other | Attending: Cardiovascular Disease

## 2016-11-27 ENCOUNTER — Other Ambulatory Visit: Payer: Self-pay

## 2016-11-27 DIAGNOSIS — E785 Hyperlipidemia, unspecified: Secondary | ICD-10-CM | POA: Diagnosis not present

## 2016-11-27 DIAGNOSIS — J449 Chronic obstructive pulmonary disease, unspecified: Secondary | ICD-10-CM

## 2016-11-27 DIAGNOSIS — I517 Cardiomegaly: Secondary | ICD-10-CM | POA: Diagnosis not present

## 2016-11-27 DIAGNOSIS — F17200 Nicotine dependence, unspecified, uncomplicated: Secondary | ICD-10-CM | POA: Insufficient documentation

## 2016-11-27 DIAGNOSIS — I10 Essential (primary) hypertension: Secondary | ICD-10-CM | POA: Insufficient documentation

## 2016-11-28 DIAGNOSIS — J449 Chronic obstructive pulmonary disease, unspecified: Secondary | ICD-10-CM | POA: Diagnosis not present

## 2016-11-28 DIAGNOSIS — I517 Cardiomegaly: Secondary | ICD-10-CM | POA: Diagnosis not present

## 2016-11-28 DIAGNOSIS — F17200 Nicotine dependence, unspecified, uncomplicated: Secondary | ICD-10-CM | POA: Diagnosis not present

## 2016-11-28 DIAGNOSIS — I10 Essential (primary) hypertension: Secondary | ICD-10-CM | POA: Diagnosis not present

## 2016-11-28 DIAGNOSIS — E785 Hyperlipidemia, unspecified: Secondary | ICD-10-CM | POA: Diagnosis not present

## 2016-12-05 ENCOUNTER — Other Ambulatory Visit: Payer: Self-pay

## 2016-12-05 DIAGNOSIS — R0609 Other forms of dyspnea: Secondary | ICD-10-CM

## 2016-12-05 DIAGNOSIS — R9431 Abnormal electrocardiogram [ECG] [EKG]: Secondary | ICD-10-CM

## 2016-12-05 DIAGNOSIS — R0600 Dyspnea, unspecified: Secondary | ICD-10-CM

## 2016-12-10 ENCOUNTER — Encounter: Payer: Self-pay | Admitting: Internal Medicine

## 2016-12-21 ENCOUNTER — Other Ambulatory Visit: Payer: Self-pay | Admitting: Acute Care

## 2016-12-21 DIAGNOSIS — F17210 Nicotine dependence, cigarettes, uncomplicated: Secondary | ICD-10-CM

## 2016-12-31 ENCOUNTER — Encounter: Payer: Self-pay | Admitting: Internal Medicine

## 2016-12-31 ENCOUNTER — Encounter (INDEPENDENT_AMBULATORY_CARE_PROVIDER_SITE_OTHER): Payer: Self-pay

## 2016-12-31 ENCOUNTER — Ambulatory Visit (INDEPENDENT_AMBULATORY_CARE_PROVIDER_SITE_OTHER): Payer: Medicare Other | Admitting: Internal Medicine

## 2016-12-31 VITALS — BP 138/86 | HR 83 | Ht 66.25 in | Wt 153.0 lb

## 2016-12-31 DIAGNOSIS — E7800 Pure hypercholesterolemia, unspecified: Secondary | ICD-10-CM

## 2016-12-31 DIAGNOSIS — I10 Essential (primary) hypertension: Secondary | ICD-10-CM

## 2016-12-31 DIAGNOSIS — R0789 Other chest pain: Secondary | ICD-10-CM | POA: Diagnosis not present

## 2016-12-31 DIAGNOSIS — R9431 Abnormal electrocardiogram [ECG] [EKG]: Secondary | ICD-10-CM

## 2016-12-31 NOTE — Patient Instructions (Addendum)
Medication Instructions:  Start aspirin 81mg  daily  Labwork: None   Testing/Procedures: Your physician has requested that you have a dobutamine echocardiogram. For further information please visit [email or web address]. Please follow instruction sheet as given.    Follow-Up: Your physician recommends that you schedule a follow-up appointment in: 4-6 weeks with Dr End.          If you need a refill on your cardiac medications before your next appointment, please call your pharmacy.

## 2016-12-31 NOTE — Progress Notes (Signed)
New Outpatient Visit Date: 12/31/2016  Referring Provider: Rita Ohara, MD [address] [place], [place] [address]  Chief Complaint: Dyspnea on exertion  HPI:  Tiffany [name] is a 71 y.o. year-old female with history of HTN, hyperlipidemia, COPD, MGUS, and tobacco use, who has been referred by Dr. Tomi Bamberger for evaluation of dyspnea on exertion. She saw Dr. Tomi Bamberger, her PCP, in early December. At that time, she complained of an episode of "heartburn." She also has chronic exertional dyspnea when carrying groceries up a hill in front of her home. She otherwise is able to carry out her usual activities including walking through [place] without any symptoms. She has not had any further episodes of chest pain since December. She describes the discomfort at that time as a severe pain radiating from her lower chest upper her neck. It lasted one minute and was relieved with Gas-X. She will occasionally use Gas-X or Rolaids when she has similar pain every few weeks to months. She denies a history of prior heart problems. She believes she underwent a stress test about 20 years ago, that she reports was normal. Tiffany [name] was previously on simvastatin but notes this was discontinued about 2 years ago due to "good cholesterol." She sleeps on 1 pillow and denies PND, leg edema, and claudication.  The patient continues to smoke about 1.5 packs per day. She has smoked since age 66. She has tried to cut back but has found this to be difficult. She is scheduled for a lung cancer screening CT of the chest later this week.  --------------------------------------------------------------------------------------------------  Cardiovascular History & Procedures: Cardiovascular Problems:  Dyspnea on exertion  Atypical chest pain  Risk Factors:  Hypertension, hyperlipidemia, tobacco use, and age > 61  Cath/PCI:  None  CV Surgery:  None  EP Procedures and Devices:  None  Non-Invasive Evaluation(s):  TTE  (11/27/16): Normal LV size and contraction (LVEF 60-65%) without regional wall motion abnormalities.  Normal diastolic function.  Mild mitral valve thickening.  Normal RV size and function.  Mild pulmonary hypertension (RVSP 35 mmHg).  Recent CV Pertinent Labs: Lab Results  Component Value Date   CHOL 213 (H) 11/01/2016   HDL 58 11/01/2016   LDLCALC 131 (H) 11/01/2016   TRIG 118 11/01/2016   CHOLHDL 3.7 11/01/2016   K 4.7 11/01/2016   BUN 11 11/01/2016   CREATININE 0.85 11/01/2016    --------------------------------------------------------------------------------------------------  Past Medical History:  Diagnosis Date  . Abnormal PFT 09/02/14   moderate restrictive ventilatory defect.  Marland Kitchen BCC (basal cell carcinoma), face 2010   Dr. Allyson Sabal  . Colon polyp 2/05  . Diverticulosis 11/2010   seen on colonoscopy and air contrast BE  . Hyperlipidemia   . Hypertension    previously treated with meds, resolved  . MGUS (monoclonal gammopathy of unknown significance) 1999   prevously under care of Dr. Learta Codding  . Osteopenia    DEXA 01/31/2010 at [facility]; 2014 at [facility]  . 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 Tobacco abuse     Past Surgical History:  Procedure Laterality Date  . BASAL CELL CARCINOMA EXCISION  2010   L face, R temple  . BILATERAL SALPINGOOPHORECTOMY  2007   cancerous tumor R ovary  . CATARACT EXTRACTION  2009   bilateral  . COLONOSCOPY  11/2010   Dr. Wynetta Emery. Diverticulosis. BE also done--normal  . OMENTECTOMY  2007   and pelvic lymphadenectomy (with BSO)  . TONSILLECTOMY  age 2  .  VAGINAL HYSTERECTOMY  late 20's    Outpatient Encounter Prescriptions as of 12/31/2016  Medication Sig  . albuterol (PROVENTIL HFA;VENTOLIN HFA) 108 (90 Base) MCG/ACT inhaler Inhale 2 puffs into the lungs every 6 (six) hours as needed for wheezing.  . Calcium-Vitamin D (CALTRATE 600 PLUS-VIT D PO) Take 1 each by mouth daily.  . Multiple Vitamins-Minerals  (HAIR/SKIN/NAILS PO) Take 1 tablet by mouth daily.  . [DISCONTINUED] ibandronate (BONIVA) 150 MG tablet Take in the morning with a full glass of water, on an empty stomach, and do not take anything else by mouth or lie down for the next 30 min. (Patient not taking: Reported on 11/01/2016)   No facility-administered encounter medications on file as of 12/31/2016.     Allergies: Patient has no known allergies.  Social History   Social History  . Marital status: Married    Spouse name: N/A  . Number of children: 1  . Years of education: N/A   Occupational History  . retired Manufacturing systems engineer) Retired   Social History Main Topics  . Smoking status: Current Every Day Smoker    Packs/day: 1.50    Years: 50.00  . Smokeless tobacco: Never Used  . Alcohol use 0.0 oz/week     Comment: maybe 1-2 times per year.  . Drug use: No  . Sexual activity: Yes    Partners: Male   Other Topics Concern  . Not on file   Social History Narrative   Lives with husband, no pets.  Son lives in [place]. 1 grandson     Family History  Problem Relation Age of Onset  . Stroke Mother   . Hypertension Father   . Suicidality Father   . Diabetes Maternal Aunt   . Diabetes Maternal Aunt   . Diabetes Maternal Aunt   . Throat cancer Maternal Uncle   . Breast cancer Maternal Aunt     2 aunts  . Lung cancer Maternal Uncle     Review of Systems: A 12-system review of systems was performed and was negative except as noted in the HPI.  --------------------------------------------------------------------------------------------------  Physical Exam: BP 138/86 (BP Location: Right Arm, Patient Position: Sitting)   Pulse 83   Ht 5' 6.25" (1.683 m)   Wt 153 lb (69.4 kg)   SpO2 96%   BMI 24.51 kg/m   General:  Well-developed woman, seated comfortably on the exam table. HEENT: No conjunctival pallor or scleral icterus.  Moist mucous membranes.  OP clear. Neck: Supple without lymphadenopathy,  thyromegaly, JVD, or HJR.  No carotid bruit. Lungs: Normal work of breathing.  Mildly diminished breath sounds throughout. No crackles. Faint  expiratory wheezes anteriorly. Heart: Regular rate and rhythm without murmurs, rubs, or gallops.  Non-displaced PMI. Abd: Bowel sounds present.  Soft, NT/ND without hepatosplenomegaly Ext: No lower extremity edema.  Radial, PT, and DP pulses are 2+ bilaterally Skin: warm and dry.  Abrasions noted on both forearms/hands; patient reports scratches from a new kitten. Neuro: CNIII-XII intact.  Strength and fine-touch sensation intact in upper and lower extremities bilaterally. Psych: Normal mood and affect.  EKG:  Normal sinus rhythm with biatrial enlargement and poor R-wave progression.  No significant change from prior tracing on 11/01/16 (I have personally reviewed both tracings).  Lab Results  Component Value Date   WBC 5.8 11/01/2016   HGB 16.6 (H) 11/01/2016   HCT 48.7 (H) 11/01/2016   MCV 94.2 11/01/2016   PLT 237 11/01/2016    Lab Results  Component Value  Date   NA 136 11/01/2016   K 4.7 11/01/2016   CL 97 (L) 11/01/2016   CO2 28 11/01/2016   BUN 11 11/01/2016   CREATININE 0.85 11/01/2016   GLUCOSE 93 11/01/2016   ALT 12 11/01/2016    Lab Results  Component Value Date   CHOL 213 (H) 11/01/2016   HDL 58 11/01/2016   LDLCALC 131 (H) 11/01/2016   TRIG 118 11/01/2016   CHOLHDL 3.7 11/01/2016    --------------------------------------------------------------------------------------------------  ASSESSMENT AND PLAN: Atypical chest pain and abnormal EKG Patient reports episodic "heartburn" for quite some time, most recently in early December. The pain is short-lived and typically rose Bond's 2 Gas-X or Rolaids. She does not have any exertional discomfort. However, she has multiple cardiac risk factors as well as dyspnea on exertion. Her EKG today also demonstrates Q waves in V1 through V3, similar to the prior tracing from December.  We have agreed to obtain a dobutamine stress echocardiogram to evaluate for ischemia. Of note, no regional wall motion abnormalities were identified on recent resting echocardiogram. In the meantime, I asked the patient to begin taking aspirin 81 mg daily.  Dyspnea on exertion I suspect this is most likely due to underlying lung disease. Echocardiogram showed mild pulmonary hypertension but otherwise no significant abnormalities. I encouraged the patient to stop smoking. She may also benefit from pulmonary evaluation. As above, we will perform a dobutamine stress echocardiogram to evaluate for obstructive coronary artery disease.  Hypertension Blood pressure upper normal to mildly elevated on exam today. We will not make any medication additions at this time. Patient should follow-up with her PCP.  Hyperlipidemia Lipid panel in 10/2016 notable for elevated total cholesterol and LDL of 131. We will readdress addition of a statin after completion of the stress test.  Follow-up: Return to clinic in 4-6 weeks.  Nelva Bush, MD 12/31/2016 8:35 AM

## 2017-01-02 ENCOUNTER — Encounter: Payer: Self-pay | Admitting: Acute Care

## 2017-01-02 ENCOUNTER — Ambulatory Visit (INDEPENDENT_AMBULATORY_CARE_PROVIDER_SITE_OTHER): Payer: Medicare Other | Admitting: Acute Care

## 2017-01-02 ENCOUNTER — Ambulatory Visit (INDEPENDENT_AMBULATORY_CARE_PROVIDER_SITE_OTHER)
Admission: RE | Admit: 2017-01-02 | Discharge: 2017-01-02 | Disposition: A | Discharge status: Home / Self Care | Payer: Medicare Other | Source: Ambulatory Visit | Attending: Acute Care | Admitting: Acute Care

## 2017-01-02 DIAGNOSIS — F17210 Nicotine dependence, cigarettes, uncomplicated: Secondary | ICD-10-CM

## 2017-01-02 DIAGNOSIS — Z87891 Personal history of nicotine dependence: Secondary | ICD-10-CM

## 2017-01-02 NOTE — Progress Notes (Signed)
Shared Decision Making Visit Lung Cancer Screening Program [phone])   Eligibility:  Age 71 y.o.  Pack Years Smoking History Calculation 75 pack year smoking history (# packs/per year x # years smoked)  Recent History of coughing up blood  no  Unexplained weight loss? yes ( >Than 15 pounds within the last 6 months )  Prior History Lung / other cancer yes (Diagnosis within the last 5 years already requiring surveillance chest CT Scans).  Smoking Status Current Smoker  Former Smokers: Years since quit: NA  Quit Date: NA  Visit Components:  Discussion included one or more decision making aids. yes  Discussion included risk/benefits of screening. yes  Discussion included potential follow up diagnostic testing for abnormal scans. yes  Discussion included meaning and risk of over diagnosis. yes  Discussion included meaning and risk of False Positives. yes  Discussion included meaning of total radiation exposure. yes  Counseling Included:  Importance of adherence to annual lung cancer LDCT screening. yes  Impact of comorbidities on ability to participate in the program. yes  Ability and willingness to under diagnostic treatment. yes  Smoking Cessation Counseling:  Current Smokers:   Discussed importance of smoking cessation. yes  Information about tobacco cessation classes and interventions provided to patient. yes  Patient provided with "ticket" for LDCT Scan. yes  Symptomatic Patient. no  CounselingNA  Diagnosis Code: Tobacco Use Z72.0  Asymptomatic Patient yes  Counseling (Intermediate counseling: > three minutes counseling) [id number]  Former Smokers:   Discussed the importance of maintaining cigarette abstinence. yes  Diagnosis Code: Personal History of Nicotine Dependence. [id number]  Information about tobacco cessation classes and interventions provided to patient. Yes  Patient provided with "ticket" for LDCT Scan. yes  Written Order for Lung Cancer  Screening with LDCT placed in Epic. Yes (CT Chest Lung Cancer Screening Low Dose W/O CM) [id number] Z12.2-Screening of respiratory organs Z87.891-Personal history of nicotine dependence   Tiffany Spatz, NP 01/02/2017      Shared Decision Making Visit Lung Cancer Screening Program [phone])   Eligibility:  Age 71 y.o.  Pack Years Smoking History Calculation 75 pack years smoking history (# packs/per year x # years smoked)  Recent History of coughing up blood  no  Unexplained weight loss? no ( >Than 15 pounds within the last 6 months )  Prior History Lung / other cancer no (Diagnosis within the last 5 years already requiring surveillance chest CT Scans).  Smoking Status Current Smoker  Former Smokers: Years since quit: NA  Quit Date: NA  Visit Components:  Discussion included one or more decision making aids. yes  Discussion included risk/benefits of screening. yes  Discussion included potential follow up diagnostic testing for abnormal scans. yes  Discussion included meaning and risk of over diagnosis. yes  Discussion included meaning and risk of False Positives. yes  Discussion included meaning of total radiation exposure. yes  Counseling Included:  Importance of adherence to annual lung cancer LDCT screening. yes  Impact of comorbidities on ability to participate in the program. yes  Ability and willingness to under diagnostic treatment. yes  Smoking Cessation Counseling:  Current Smokers:   Discussed importance of smoking cessation. yes  Information about tobacco cessation classes and interventions provided to patient. yes  Patient provided with "ticket" for LDCT Scan. yes  Symptomatic Patient. no  CounselingNA  Diagnosis Code: Tobacco Use Z72.0  Asymptomatic Patient yes  Counseling (Intermediate counseling: > three minutes counseling) [id number]  Former Smokers:   Discussed the importance of maintaining  cigarette abstinence. yes  Diagnosis Code:  Personal History of Nicotine Dependence. [id number]  Information about tobacco cessation classes and interventions provided to patient. Yes  Patient provided with "ticket" for LDCT Scan. yes  Written Order for Lung Cancer Screening with LDCT placed in Epic. Yes (CT Chest Lung Cancer Screening Low Dose W/O CM) [id number] Z12.2-Screening of respiratory organs Z87.891-Personal history of nicotine dependence  I have spent 25 minutes of face to face time with Tiffany [name]  discussing the risks and benefits of lung cancer screening. We viewed a power point together that explained in detail the above noted topics. We paused at intervals to allow for questions to be asked and answered to ensure understanding.We discussed that the single most powerful action that she can take to decrease her risk of developing lung cancer is to quit smoking. We discussed whether or not she is ready to commit to setting a quit date. She is currently not ready to set a quit date . We discussed options for tools to aid in quitting smoking including nicotine replacement therapy, non-nicotine medications, support groups, Quit Smart classes, and behavior modification. We discussed that often times setting smaller, more achievable goals, such as eliminating 1 cigarette a day for a week and then 2 cigarettes a day for a week can be helpful in slowly decreasing the number of cigarettes smoked. This allows for a sense of accomplishment as well as providing a clinical benefit. I gave her the " Be Stronger Than Your Excuses" card with contact information for community resources, classes, free nicotine replacement therapy, and access to mobile apps, text messaging, and on-line smoking cessation help. I have also given her my card and contact information in the event she needs to contact me. We discussed the time and location of the scan, and that either June Leap, CMA, or I will call with the results within 24-48 hours of receiving them. I have  provided her with a copy of the power point we viewed  as a resource in the event they need reinforcement of the concepts we discussed today in the office. The patient verbalized understanding of all of  the above and had no further questions upon leaving the office. They have my contact information in the event they have any further questions.  I spent 4 minutes and smoking cessation counseling  Tiffany Spatz, NP 01/02/2017

## 2017-01-08 ENCOUNTER — Other Ambulatory Visit (HOSPITAL_COMMUNITY): Payer: Medicare Other

## 2017-01-09 ENCOUNTER — Telehealth: Payer: Self-pay | Admitting: Acute Care

## 2017-01-09 DIAGNOSIS — F17210 Nicotine dependence, cigarettes, uncomplicated: Secondary | ICD-10-CM

## 2017-01-09 NOTE — Telephone Encounter (Signed)
Pt had lung cancer screening CT. Will route to lung nodule pool.

## 2017-01-09 NOTE — Telephone Encounter (Signed)
Tiffany [name], please advise on pt's CT results from 01/02/17.

## 2017-01-09 NOTE — Telephone Encounter (Signed)
I called Mrs. Argie Kehres with the results of her low-dose screening CT. I explained that her scan was read as a lung RADS 2, indicating nodules that are benign in appearance and behavior. Recommendation per radiology is for annual low-dose CT screening in 12 months. We will order and schedule the scan for February 2019. I told her that I will  forward a copy  of the results of this scan to her PCP. She verbalized understanding of the above and had no further questions at completion of the call.

## 2017-01-21 ENCOUNTER — Other Ambulatory Visit (HOSPITAL_COMMUNITY): Payer: Medicare Other

## 2017-01-31 ENCOUNTER — Ambulatory Visit: Payer: Medicare Other | Admitting: Internal Medicine

## 2017-01-31 ENCOUNTER — Telehealth: Payer: Self-pay | Admitting: Internal Medicine

## 2017-01-31 NOTE — Telephone Encounter (Signed)
No answer

## 2017-01-31 NOTE — Telephone Encounter (Signed)
pt calling to cancel stress echo scheduled for 01/21/2017 -does not want to do this.  Patient also cancelled Dr. Darnelle Bos appointment due to no testing

## 2017-02-04 NOTE — Telephone Encounter (Signed)
Pt did not want to keep appointment with Dr End either.

## 2017-02-04 NOTE — Telephone Encounter (Signed)
Pt states she just doesn't want to have testing done, she feels fine.

## 2017-02-11 ENCOUNTER — Other Ambulatory Visit: Payer: Self-pay | Admitting: Family Medicine

## 2017-02-11 DIAGNOSIS — Z1231 Encounter for screening mammogram for malignant neoplasm of breast: Secondary | ICD-10-CM

## 2017-03-06 ENCOUNTER — Ambulatory Visit
Admission: RE | Admit: 2017-03-06 | Discharge: 2017-03-06 | Disposition: A | Discharge status: Home / Self Care | Payer: Medicare Other | Source: Ambulatory Visit | Attending: Family Medicine | Admitting: Family Medicine

## 2017-03-06 DIAGNOSIS — Z1231 Encounter for screening mammogram for malignant neoplasm of breast: Secondary | ICD-10-CM

## 2017-05-05 NOTE — Progress Notes (Signed)
Chief Complaint  Patient presents with  . Hypertension    fasting med check-no concerns.    Lung cancer screening: scan was read as a lung RADS 2, indicating nodules that are benign in appearance and behavior. Plan is for annual low-dose CT screening in 12 months, due February 2019. She continues to smoke 1.5 PPD, would love to quit, but admits not at all being ready or willing to try.  She has chronic dyspnea with exertion, and has had some intermittent chest pain (which responds to Gas-X).  Given her risk factors, she was referred to cardiologist.  She saw Dr. Saunders Revel in February. He recommended doing dobutamine stress echocardiogram to evaluate for ischemia and recommended that she start taking aspirin 81 mg daily. She keeps forgetting to buy it.  He felt that DOE is most likely due to underlying lung disease. His plan was to readdress use of statin after stress test (Lipid panel in 10/2016 notable for elevated total cholesterol and LDL of 131).  She canceled the dobutamine stress test and did not follow up with the cardiologist.  "I didn't want to run on a treadmill". She previously took simvastatin, has been off for 1-2 years (she had run out, and levels were ok).    Osteopenia--changed from fosamax (due to nausea) to Actonel in the past, which she tolerated fine. Then there was then an insurance issue, she she had been off the medication, later changed to [facility]. She took it for 3 months--cost her $65 dollars and stated it bothered her stomach, so she stopped it (and didn't contact us). She had acid problems--couldn't recall how long it lasted. Last year she reported she was willing to retry Boniva, and recommended she use Nexium if needed. When she went to get the prescription, she was told her insurance didn't cover it, so she never restarted it. At her physical in December, she was asked to contact her insurance to see which bisphosphonate was covered/affordable.She didn't ever check with her  insurance company. She takes a calcium +D supplement daily. She continues to smoke, and doesn't get any regular weight-bearing exercise (other than walking around carrying a heavy purse on her shoulder while shopping).   Last DEXA 12/2015: ASSESSMENT: The BMD measured at Femur Neck Right is 0.784 g/cm2 with a T-score of -1.8. This patient is considered to be osteopenic according to [facility]) criteria. Lumbar spine was not utilized due to advanced degenerative changes. There has been a statistically significant increase in BMD of Left hip since prior exam dated 08/27/2013.  Fell onto her right hip last week when she was [place]. No injury.  Vitamin D deficiency. Last level was 30 in 10/2016.  Tobacco use--She continues to smoke 1.5PPD; husband also smokes. She has shortness of breath when going up and down the stairs to do her laundry, and in carrying her groceries in (up a hill to her house). She uses albuterol as needed, with good results. Otherwise, has limited exercise, and therefore no significant shortness of breath. No problems when walking around stores, or other activity.  COPD: Spirometry in 10/2016 showed severe airway obstruction, with low vital capacity. We discussed the need for daily medication, and was asked to get me copies of the pages in the book to see which meds were preferred.She never did this.  She uses albuterol only prn shortness of breath, which is after exertion such as unloading groceries, taking trash cans  back.  She has never tried using the albuterol more regularly or prior to exertion.  Elevated TSH noted on last check, with normal free T4.  She denies any changes in hair/skin/bowels/energy/temperature intolerance. She has noticed some weight loss, with no particular dietary change.  Lab Results  Component Value Date   TSH 4.75 (H) 11/01/2016   History of elevated blood  pressures. She took blood pressure medication many years ago, under the care of Dr. Hassell Done.  Usually her BP comes down on recheck. Denies headaches.  PMH, PSH, SH reviewed  Outpatient Encounter Prescriptions as of 05/06/2017  Medication Sig Note  . albuterol (PROVENTIL HFA;VENTOLIN HFA) 108 (90 Base) MCG/ACT inhaler Inhale 2 puffs into the lungs every 6 (six) hours as needed for wheezing. 05/06/2017: Uses prn exertion (after, not prior)  . Calcium-Vitamin D (CALTRATE 600 PLUS-VIT D PO) Take 1 each by mouth daily.   Marland Kitchen aspirin EC 81 MG tablet Take 1 tablet (81 mg total) by mouth daily. 05/06/2017: Hasn't started, hasn't bought yet  . Multiple Vitamins-Minerals (HAIR/SKIN/NAILS PO) Take 1 tablet by mouth daily. 11/01/2016: Takes only sporadically   No facility-administered encounter medications on file as of 05/06/2017.    No Known Allergies  ROS: denies fever, chills, headaches, allergy or URI symptoms. +chronic smoker's cough. Dyspnea as per HPI. No chest pain, palpitations, edema, nausea, vomiting, bowel changes, urinary complaints, bleeding, bruising, rash, depression or other complaints. Some weight loss noted.   PHYSICAL EXAM:  BP 132/88 (BP Location: Left Arm, Patient Position: Sitting, Cuff Size: Normal)   Pulse 88   Ht 5' 6.25" (1.683 m)   Wt 147 lb (66.7 kg)   BMI 23.55 kg/m   128/76 on repeat by MD, RA  Wt Readings from Last 3 Encounters:  05/06/17 147 lb (66.7 kg)  12/31/16 153 lb (69.4 kg)  11/01/16 153 lb 12.8 oz (69.8 kg)    Well developed, pleasant female in no distress.  She is in good spirits, and speaks easily without any distress or cough. HEENT: PERRL, EOMI, conjunctiva and sclera clear. OP dentures. Visualized mucosa and tongue are normal Neck: no lymphadenopathy, thyromegaly or carotid bruit Heart: regular rate and rhythm Lungs: Decreased air movement, no wheezes, rales, ronchi Abdomen: soft, nontender, no organomegaly or mass Extremities: no edema, normal  pulses Psych: normal mood, affect, hygiene and grooming Neuro: alert and oriented, cranial nerves intact, normal strength, gait Skin:  AK's noted on forearms   ASSESSMENT/PLAN:  Chronic obstructive pulmonary disease, unspecified COPD type ([place]) - tobacco cessation encouraged. Meds recommended--will review covered options. to try albuterol prior to exertion rather than prn  Osteopenia, unspecified location - she will get [place] list of covered meds by her insurance and will retart bisphophonate  Tobacco use disorder  Pure hypercholesterolemia - recheck lipids. reschedule stress test.  If any abnl, restart statin, or if chol high. - Plan: Lipid panel  Vitamin D deficiency - Plan: VITAMIN D 25 Hydroxy (Vit-D Deficiency, Fractures)  Abnormal TSH - no symptoms of hypothyroidism.  Some weight loss, not gain. monitor. - Plan: TSH  Weight loss - Plan: TSH  Actinic keratosis - forearms.  pt to f/u with Dr. Ledell Peoples office    DEXA f/u due 12/2017  Recheck TSH, Vit D, lipid  Needs COPD meds and bisphosphonate.  She will bring her [employer] book in later this week.  AK's--f/u with Dr. Ledell Peoples office for treatment.   Please bring your [facility] book when you return with your husband on Thursday.  Let the girls up front photocopy the respiratory/COPD medication page so that I can prescribe a medication that is covered, to help your breathing.  Please reconsider the stress test on your heart.  I believe they were planning a medical stress, not having you walk on the treadmill. You can call their office to reschedule and they can answer any questions/concerns you have about the test.   Please check with your insurance company and/or let our girls make copies of the pages of the medications that are related to bones (Boniva, Actonel).   Please buy ENTERIC-COATED 81mg  ASPIRIN when you go to the store next.  Please take 1 daily.  Please schedule a follow-up appointment with Dr. Ledell Peoples office for  evaluation and treatment of your skin--I think you have some precancerous lesions (actinic keratosis) that need treatment.

## 2017-05-06 ENCOUNTER — Ambulatory Visit (INDEPENDENT_AMBULATORY_CARE_PROVIDER_SITE_OTHER): Payer: Medicare Other | Admitting: Family Medicine

## 2017-05-06 ENCOUNTER — Encounter: Payer: Self-pay | Admitting: Family Medicine

## 2017-05-06 VITALS — BP 128/76 | HR 88 | Ht 66.25 in | Wt 147.0 lb

## 2017-05-06 DIAGNOSIS — E7800 Pure hypercholesterolemia, unspecified: Secondary | ICD-10-CM

## 2017-05-06 DIAGNOSIS — E559 Vitamin D deficiency, unspecified: Secondary | ICD-10-CM

## 2017-05-06 DIAGNOSIS — F17200 Nicotine dependence, unspecified, uncomplicated: Secondary | ICD-10-CM

## 2017-05-06 DIAGNOSIS — M8580 Other specified disorders of bone density and structure, unspecified site: Secondary | ICD-10-CM

## 2017-05-06 DIAGNOSIS — J449 Chronic obstructive pulmonary disease, unspecified: Secondary | ICD-10-CM | POA: Diagnosis not present

## 2017-05-06 DIAGNOSIS — R634 Abnormal weight loss: Secondary | ICD-10-CM

## 2017-05-06 DIAGNOSIS — L570 Actinic keratosis: Secondary | ICD-10-CM

## 2017-05-06 DIAGNOSIS — R946 Abnormal results of thyroid function studies: Secondary | ICD-10-CM

## 2017-05-06 DIAGNOSIS — R7989 Other specified abnormal findings of blood chemistry: Secondary | ICD-10-CM

## 2017-05-06 LAB — LIPID PANEL
CHOLESTEROL: 188 mg/dL (ref <200)
HDL: 48 mg/dL — AB (ref >50)
LDL CALC: 117 mg/dL — AB (ref <100)
TRIGLYCERIDES: 113 mg/dL (ref <150)
Total CHOL/HDL Ratio: 3.9 Ratio (ref <5.0)
VLDL: 23 mg/dL (ref <30)

## 2017-05-06 LAB — TSH: TSH: 4.44 mIU/L

## 2017-05-06 NOTE — Patient Instructions (Addendum)
  Please bring your [facility] book when you return with your husband on Thursday.  Let the girls up front photocopy the respiratory/COPD medication page so that I can prescribe a medication that is covered, to help your breathing.  Please reconsider the stress test on your heart.  I believe they were planning a medical stress, not having you walk on the treadmill. You can call their office to reschedule and they can answer any questions/concerns you have about the test. Please call Dr. Darnelle Bos office to reschedule.  Please check with your insurance company and/or let our girls make copies of the pages of the medications that are related to bones (Boniva, Actonel).   Please buy ENTERIC-COATED 81mg  ASPIRIN when you go to the store next.  Please take 1 daily.  Please schedule a follow-up appointment with Dr. Ledell Peoples office for evaluation and treatment of your skin--I think you have some precancerous lesions (actinic keratosis) that need treatment.

## 2017-05-07 ENCOUNTER — Encounter: Payer: Self-pay | Admitting: Family Medicine

## 2017-05-07 LAB — VITAMIN D 25 HYDROXY (VIT D DEFICIENCY, FRACTURES): Vit D, 25-Hydroxy: 27 ng/mL — ABNORMAL LOW (ref 30–100)

## 2017-05-23 ENCOUNTER — Other Ambulatory Visit: Payer: Self-pay | Admitting: *Deleted

## 2017-05-23 MED ORDER — UMECLIDINIUM-VILANTEROL 62.5-25 MCG/INH IN AEPB: 1.0000 | INHALATION_SPRAY | Freq: Every day | RESPIRATORY_TRACT | 0 refills | Status: DC

## 2017-09-09 ENCOUNTER — Other Ambulatory Visit (INDEPENDENT_AMBULATORY_CARE_PROVIDER_SITE_OTHER): Payer: Medicare Other

## 2017-09-09 DIAGNOSIS — Z23 Encounter for immunization: Secondary | ICD-10-CM | POA: Diagnosis not present

## 2017-09-20 DIAGNOSIS — H40013 Open angle with borderline findings, low risk, bilateral: Secondary | ICD-10-CM | POA: Diagnosis not present

## 2017-10-18 DIAGNOSIS — H40003 Preglaucoma, unspecified, bilateral: Secondary | ICD-10-CM | POA: Diagnosis not present

## 2017-11-29 NOTE — Progress Notes (Signed)
Chief Complaint  Patient presents with  . Medicare Wellness    fasting AWV/CPE with pelvic. Just had eye exam. Will give urine on way out. No concerns.     Tiffany [name] is a 72 y.o. female who presents for annual physical, Medicare wellness visit and follow-up on chronic medical conditions.    Lung cancer screening: scan was read as a lung RADS 2, indicating nodules that are benign in appearance and behavior. Plan is for annual low-dose CT screening in 12 months, due February 2019. She continues to smoke 1.5 PPD, would love to quit, but admits not at all being ready or willing to try. Reports this the same as last year.  She has chronic DOE, and has had some intermittent chest pain (which responds to Gas-X).  Given her risk factors, she was referred to cardiologist last year.  She saw Dr. Saunders Revel in February. He recommended doing dobutamine stress echocardiogram to evaluate for ischemia and recommended that she start taking aspirin 81 mg daily. She is taking this daily without side effects. He felt that DOE is most likely due to underlying lung disease. His plan was to readdress use of statin after stress test. She canceled the dobutamine stress test and did not follow up with the cardiologist.  "I didn't want to run on a treadmill". She previously took simvastatin, has been off for a couple of years (she had run out, and levels were ok).   In June we had discussed stress tests, and options which do NOT include the treadmill, and encouraged her to contact Dr. Saunders Revel to reschedule. She has not done this.  Last lipids were in June: (LDL was improved from prior value of 131). Lab Results  Component Value Date   CHOL 188 05/06/2017   HDL 48 (L) 05/06/2017   LDLCALC 117 (H) 05/06/2017   TRIG 113 05/06/2017   CHOLHDL 3.9 05/06/2017  She states today that she prefers to restart the simvastatin, if that would help reduce her risks.  She had no problems that she can recall. She feels like her lipids will be  higher--husband has been home more with the colder weather, and she is cooking breakfast (eggs, sausage etc) and eating differently.  Osteopenia--changed from fosamax (due to nausea) to Actonel in the past, which she tolerated fine. Then there was then an insurance issue, she she had been off the medication, later changed to [facility]. She took it for 3 months--cost her $65 dollars and stated it bothered her stomach, so she stopped it (and didn't contact us). She had acid problems--couldn't recall how long it lasted. Last year she reported she waswilling to retry Boniva, and recommended she use Nexium if needed. When she went to get the prescription, she was told her insurance didn't cover it, so she never restarted it.  Per insurance info dropped off in July, alendronate is the only option.  She doesn't have osteoporosis (lowest T score was -2.0), so not a candidate for prolia yet.  She takes a calcium +D supplement daily. She continues to smoke, and doesn't get any regular weight-bearing exercise.   Last DEXA 12/2015: ASSESSMENT: The BMD measured at Femur Neck Right is 0.784 g/cm2 with a T-score of -1.8. This patient is considered to be osteopenic according to [facility]) criteria. Lumbar spine was not utilized due to advanced degenerative changes. There has been a statistically significant increase in BMD of Left hip since prior exam dated 08/27/2013.  Vitamin D deficiency. Last level  was 27 in June, and she was asked to start 1000 IU of D3 in addition to her daily MVI. She reports she took the D3 for a while, until she ran out. Not taken recently.  Tobacco use--She continues to smoke 1.5PPD;husband also smokes. She has shortness of breath when going up and down the stairs to do her laundry, and in carrying her groceries in (up a hill to her house). She uses albuterol as needed, with good results. Otherwise, has limited exercise, and therefore no significant shortness of  breath. No problems when walking around stores, or other activity.  COPD: Spirometry in 10/2016 showed severe airway obstruction, with low vital capacity. We discussed the need for daily medication, and was asked to get me copies of the pages in the book to see which meds were preferred.She was given a sample of Anoro in June. She did notice some improvement, breathing better.  She never called for prescription. This is listed as Tier 3 in her insurance book. She uses albuterol only prn shortness of breath, which is after exertion such as unloading groceries, taking trash cans back.    Elevated TSH noted in 10/2016 (TSH 4.75) with normal free T4; recheck in June was stable.  She denies any changes in hair/skin/bowels/energy/temperature intolerance. Lab Results  Component Value Date   TSH 4.44 05/06/2017   History of elevated blood pressures. She checks her BP periodically at home (husband got a monitor from the [place]).  She can't recall any of her values, but reports "they have been fine".  She took blood pressure medication many years ago, under the care of Dr. Hassell Done.  Usually her BP comes down on recheck. Denies headaches.   Immunization History  Administered Date(s) Administered  . Influenza Split 10/16/2012  . Influenza, High Dose Seasonal PF 09/02/2013, 08/25/2014, 08/31/2015, 11/01/2016, 09/09/2017  . Pneumococcal Conjugate-13 08/25/2014  . Pneumococcal Polysaccharide-23 12/12/2009, 08/31/2015  . Tdap 12/12/2009   Last Pap smear: s/p hyst in 20's  Last mammogram: 02/2017 Last colonoscopy: 11/2010 Dr. Wynetta Emery. She got a letter last year stating she was due, but didn't go. "I didn't want to". She recalls she had a polyp last time. Last DEXA: 12/2015 Dentist: doesn't go, has dentures  Ophtho: Twice a year, sees specialist for "wrinkled retina" Exercise: Nothing regular. Walks around [place] and the mall for shopping/exercise (no sustained aerobic activity, no weight-bearing  exercise).  Other doctors caring for patient include: Dermatologist: Dr. Allyson Sabal GI: Dr. Wynetta Emery Ophtho: Dr. Wynetta Emery ophtho (specialist): Dr. Edilia Bo Cardiologist: Dr. Saunders Revel.  Depression, Fall and Functional status screening performed--see epic questionnaire. Notable only for trouble with stairs due to dyspnea.   End of Life Discussion: Patient does not havea living will and medical power of attorney. She still has the paperwork at home.  Past Medical History:  Diagnosis Date  . Abnormal PFT 09/02/14   moderate restrictive ventilatory defect.  Marland Kitchen BCC (basal cell carcinoma), face 2010   Dr. Allyson Sabal  . Colon polyp 2/05  . Diverticulosis 11/2010   seen on colonoscopy and air contrast BE  . Hyperlipidemia   . Hypertension    previously treated with meds, resolved  . MGUS (monoclonal gammopathy of unknown significance) 1999   prevously under care of Dr. Learta Codding  . Osteopenia    DEXA 01/31/2010 at [facility]; 2014 at [facility]  . 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 Tobacco abuse     Past Surgical History:  Procedure Laterality Date  .  BASAL CELL CARCINOMA EXCISION  2010   L face, R temple  . BILATERAL SALPINGOOPHORECTOMY  2007   cancerous tumor R ovary  . CATARACT EXTRACTION  2009   bilateral  . COLONOSCOPY  11/2010   Dr. Wynetta Emery. Diverticulosis. BE also done--normal  . OMENTECTOMY  2007   and pelvic lymphadenectomy (with BSO)  . TONSILLECTOMY  age 51  . VAGINAL HYSTERECTOMY  late 20's    Social History   Socioeconomic History  . Marital status: Married    Spouse name: Not on file  . Number of children: 1  . Years of education: Not on file  . Highest education level: Not on file  Social Needs  . Financial resource strain: Not on file  . Food insecurity - worry: Not on file  . Food insecurity - inability: Not on file  . Transportation needs - medical: Not on file  . Transportation needs - non-medical: Not on file  Occupational History  .  Occupation: retired Manufacturing systems engineer)    Employer: RETIRED  Tobacco Use  . Smoking status: Current Every Day Smoker    Packs/day: 1.50    Years: 50.00    Pack years: 75.00  . Smokeless tobacco: Never Used  Substance and Sexual Activity  . Alcohol use: Yes    Alcohol/week: 0.0 oz    Comment: maybe 1-2 times per year. (rare beer in the summer)  . Drug use: No  . Sexual activity: Yes    Partners: Male  Other Topics Concern  . Not on file  Social History Narrative   Lives with husband, 1 cat.  Son lives in [place]. 1 grandson     Family History  Problem Relation Age of Onset  . Stroke Mother   . Cerebral aneurysm Mother   . Hypertension Father   . Suicidality Father   . Diabetes Maternal Aunt   . Diabetes Maternal Aunt   . Diabetes Maternal Aunt   . Throat cancer Maternal Uncle   . Breast cancer Maternal Aunt        2 aunts  . Lung cancer Maternal Uncle     Outpatient Encounter Medications as of 12/02/2017  Medication Sig Note  . albuterol (PROVENTIL HFA;VENTOLIN HFA) 108 (90 Base) MCG/ACT inhaler Inhale 2 puffs into the lungs every 6 (six) hours as needed for wheezing. 05/06/2017: Uses prn exertion (after, not prior)  . aspirin EC 81 MG tablet Take 1 tablet (81 mg total) by mouth daily.   . Calcium-Vitamin D (CALTRATE 600 PLUS-VIT D PO) Take 1 each by mouth daily.   Marland Kitchen umeclidinium-vilanterol (ANORO ELLIPTA) 62.5-25 MCG/INH AEPB Inhale 1 puff into the lungs daily.   . [DISCONTINUED] Multiple Vitamins-Minerals (HAIR/SKIN/NAILS PO) Take 1 tablet by mouth daily. 11/01/2016: Takes only sporadically  . [DISCONTINUED] umeclidinium-vilanterol (ANORO ELLIPTA) 62.5-25 MCG/INH AEPB Inhale 1 puff into the lungs daily. (Patient not taking: Reported on 12/02/2017) 12/02/2017: Only took sample   No facility-administered encounter medications on file as of 12/02/2017.     No Known Allergies  ROS: The patient denies anorexia, fever, weight changes, headaches, vision changes, decreased  hearing, ear pain, sore throat, breast concerns, chest pain, dizziness, syncope, swelling, nausea, vomiting, diarrhea, constipation, abdominal pain, melena, hematochezia, hematuria, incontinence, dysuria, vaginal bleeding, discharge, odor or itch, genital lesions, joint pains (just intermittent L knee pain, right hip), weakness, tremor, suspicious skin lesions (due to see Dr. Allyson Sabal, has some irritated skin areas), depression, anxiety, abnormal bleeding/bruising, or enlarged lymph nodes. Occasional night sweats, no hot  flashes during the day  Some vaginal dryness  SOB only with stairs and carrying groceries uphill, per HPI. Some smoker's cough -- thick, clear phlegm.    PHYSICAL EXAM:  BP (!) 148/96   Pulse 80   Ht 5' 5.5" (1.664 m)   Wt 149 lb (67.6 kg)   BMI 24.42 kg/m   140/74 on repeat by MD, RA  Wt Readings from Last 3 Encounters:  12/02/17 149 lb (67.6 kg)  05/06/17 147 lb (66.7 kg)  12/31/16 153 lb (69.4 kg)    General Appearance:  Alert, cooperative, no distress, appears stated age. Speaks easily in full sentences, in no distress  Head:  Normocephalic, without obvious abnormality, atraumatic   Eyes:  PERRL, conjunctiva/corneas clear, EOM's intact, fundi not well visualized  Ears:  Normal TM's and external ear canals   Nose:  Nares normal, mucosa normal, no drainage or sinus tenderness   Throat:  Lips, visualized mucosa, and tongue normal; upper and lower dentures in place, limiting full visibility of oropharynx  Neck:  Supple, no lymphadenopathy; thyroid: no enlargement/tenderness/nodules; no carotid bruit or JVD   Back:  Spine nontender, no curvature, ROM normal, no CVA tenderness   Lungs:  Diminished air movement throughout, otherwise clear. No wheezes, rales, ronchi.  Chest Wall:  No tenderness or deformity   Heart:  Regular rate and rhythm, S1 and S2 normal, no murmur, rub or gallop   Breast Exam:  No tenderness, masses, or nipple discharge or  inversion. More fibroglandular changes noted on left than right. No axillary lymphadenopathy   Abdomen:  Soft, non-tender, nondistended, normoactive bowel sounds, no masses, no hepatosplenomegaly   Genitalia:  Normal external genitalia without lesions. BUS and vagina normal, some atrophic changes; No abnormal vaginal discharge. Uterus absent. No adnexal masses or tenderness. Pap not performed   Rectal:  Normal tone, no masses or tenderness; guaiac negative stool   Extremities:  No clubbing, cyanosis or edema   Pulses:  2+ and symmetric all extremities   Skin:  Turgor normal, no rashes. Significant actinic changes to skin.ecchymoses and purpura on both forearms, thin skin. Very dry skin on lower legs. small dry patch on right wrist (smaller than in past).   Lymph nodes:  Cervical, supraclavicular, and axillary nodes normal   Neurologic:  CNII-XII intact, normal strength, sensation and gait; reflexes 2+ and symmetric throughout   Psych:  Normal mood, affect, hygiene and grooming  Spirometry: Severe airway obstruction, with low vital capacity.   ASSESSMENT/PLAN:  Annual physical exam - Plan: Lipid panel, Comprehensive metabolic panel, CBC with Differential/Platelet, VITAMIN D 25 Hydroxy (Vit-D Deficiency, Fractures), TSH  Medicare annual wellness visit, subsequent  Chronic obstructive pulmonary disease, unspecified COPD type ([place]) - strongly encouraged smoking cessation; restart Anoro, daily, long-term - Plan: CBC with Differential/Platelet, Spirometry with Graph  Osteopenia, unspecified location - continue calcium, add D3 1000 IU daily; add in weight-bearing exercise 2x/week; no meds at this time. recheck DEXA when next mammo due - Plan: VITAMIN D 25 Hydroxy (Vit-D Deficiency, Fractures), DG Bone Density  Tobacco dependence - counseled extensively re: risks, and counseled re: quitting - Plan: DG Bone Density  Vitamin D deficiency - hasn't been taking  1000 IU daily recently; advised to restart and continue Ca+D - Plan: VITAMIN D 25 Hydroxy (Vit-D Deficiency, Fractures)  Abnormal TSH - asymptomatic - Plan: TSH  Pure hypercholesterolemia - plan to restart statin.  Unsure if she will go for stress test as recommended, so will try and minimize risks -  Plan: Lipid panel  MGUS (monoclonal gammopathy of unknown significance) - Plan: CBC with Differential/Platelet, Multiple Myeloma Panel (SPEP&IFE w/QIG)  Medication monitoring encounter - Plan: Lipid panel, Comprehensive metabolic panel, VITAMIN D 25 Hydroxy (Vit-D Deficiency, Fractures)   WILL NEED SIMVASTATIN--WAIT ON RESULTS.  Fasting labs in 2 months (ENTER FUTURE ORDERS--LFT, lipid)   SPEP & IFE with QIG c-met, lipid, CBC, TSH, Vit D  Follw up with Dr. Bevely [name] his office to reschedule stress test We are going to restart simvastatin regardless of today's results (which you report may be higher than normal due to eating more eggs).  I still encourage you to have the stress test though.  Please call/email/fax/mail/drop off a list of your blood pressures from home at some point in the next month.  I want to make sure that your blood pressure is really okay.  Please call [facility] to schedule your colonoscopy.  A 5 year follow-up was due in 2017, due to you having colon polyps.  Restart taking 1000 IU of Vitamin D3 in addition to your calcium that has D. Take this every day, long-term.  You are due for you lung cancer screening CT in February--contact them if you don't hear from them soon.   Discussed monthly self breast exams and yearly mammograms; at least 30 minutes of aerobic activity at least 5 days/week, weight-bearing exercise 2x/wk; proper sunscreen use reviewed; healthy diet, including goals of calcium and vitamin D intake and alcohol recommendations (less than or equal to 1 drink/day) reviewed; regular seatbelt use; changing batteries in smoke detectors. Immunization  recommendations discussed--continue yearly high dose flu shots; Shingrix recommended and side effects reviewed. Colonoscopy recommendations reviewed--per pt report, she was due for 5 year f/u in 2017; advised to call [facility] and schedule. DEXA due 12/2017--ok to wait until mammo due in April and do both together.  Full Code, Full care. Doesn't want prolonged measures.  Living Will and healthcare POA again discussed in detail, encouraged her to complete and return. MOST form filled out.  F/u 6 mos (but fasting labs 2 mos after starting statin)   Medicare Attestation I have personally reviewed: The patient's medical and social history Their use of alcohol, tobacco or illicit drugs Their current medications and supplements The patient's functional ability including ADLs,fall risks, home safety risks, cognitive, and hearing and visual impairment Diet and physical activities Evidence for depression or mood disorders  The patient's weight, height and BMI have been recorded in the chart.  I have made referrals, counseling, and provided education to the patient based on review of the above and I have provided the patient with a written personalized care plan for preventive services.

## 2017-12-02 ENCOUNTER — Encounter: Payer: Self-pay | Admitting: Family Medicine

## 2017-12-02 ENCOUNTER — Ambulatory Visit: Payer: Medicare Other | Admitting: Family Medicine

## 2017-12-02 VITALS — BP 140/74 | HR 80 | Ht 65.5 in | Wt 149.0 lb

## 2017-12-02 DIAGNOSIS — E7800 Pure hypercholesterolemia, unspecified: Secondary | ICD-10-CM | POA: Diagnosis not present

## 2017-12-02 DIAGNOSIS — Z0000 Encounter for general adult medical examination without abnormal findings: Secondary | ICD-10-CM | POA: Diagnosis not present

## 2017-12-02 DIAGNOSIS — M8580 Other specified disorders of bone density and structure, unspecified site: Secondary | ICD-10-CM | POA: Diagnosis not present

## 2017-12-02 DIAGNOSIS — E559 Vitamin D deficiency, unspecified: Secondary | ICD-10-CM

## 2017-12-02 DIAGNOSIS — Z5181 Encounter for therapeutic drug level monitoring: Secondary | ICD-10-CM | POA: Diagnosis not present

## 2017-12-02 DIAGNOSIS — J449 Chronic obstructive pulmonary disease, unspecified: Secondary | ICD-10-CM

## 2017-12-02 DIAGNOSIS — D472 Monoclonal gammopathy: Secondary | ICD-10-CM | POA: Diagnosis not present

## 2017-12-02 DIAGNOSIS — F17200 Nicotine dependence, unspecified, uncomplicated: Secondary | ICD-10-CM

## 2017-12-02 DIAGNOSIS — R7989 Other specified abnormal findings of blood chemistry: Secondary | ICD-10-CM | POA: Diagnosis not present

## 2017-12-02 LAB — POCT URINALYSIS DIP (PROADVANTAGE DEVICE)
Bilirubin, UA: NEGATIVE
GLUCOSE UA: NEGATIVE mg/dL
Ketones, POC UA: NEGATIVE mg/dL
Leukocytes, UA: NEGATIVE
NITRITE UA: NEGATIVE
PH UA: 6 (ref 5.0–8.0)
Protein Ur, POC: NEGATIVE mg/dL
RBC UA: NEGATIVE
SPECIFIC GRAVITY, URINE: 1.015
UUROB: NEGATIVE

## 2017-12-02 MED ORDER — UMECLIDINIUM-VILANTEROL 62.5-25 MCG/INH IN AEPB: 1.0000 | INHALATION_SPRAY | Freq: Every day | RESPIRATORY_TRACT | 11 refills | Status: DC

## 2017-12-02 NOTE — Patient Instructions (Addendum)
HEALTH MAINTENANCE RECOMMENDATIONS:  It is recommended that you get at least 30 minutes of aerobic exercise at least 5 days/week (for weight loss, you may need as much as 60-90 minutes). This can be any activity that gets your heart rate up. This can be divided in 10-15 minute intervals if needed, but try and build up your endurance at least once a week.  Weight bearing exercise is also recommended twice weekly.  Eat a healthy diet with lots of vegetables, fruits and fiber.  "Colorful" foods have a lot of vitamins (ie green vegetables, tomatoes, red peppers, etc).  Limit sweet tea, regular sodas and alcoholic beverages, all of which has a lot of calories and sugar.  Up to 1 alcoholic drink daily may be beneficial for women (unless trying to lose weight, watch sugars).  Drink a lot of water.  Calcium recommendations are 1200-1500 mg daily (1500 mg for postmenopausal women or women without ovaries), and vitamin D 1000 IU daily.  This should be obtained from diet and/or supplements (vitamins), and calcium should not be taken all at once, but in divided doses.  Monthly self breast exams and yearly mammograms for women over the age of 16 is recommended.  Sunscreen of at least SPF 30 should be used on all sun-exposed parts of the skin when outside between the hours of 10 am and 4 pm (not just when at beach or pool, but even with exercise, golf, tennis, and yard work!)  Use a sunscreen that says "broad spectrum" so it covers both UVA and UVB rays, and make sure to reapply every 1-2 hours.  Remember to change the batteries in your smoke detectors when changing your clock times in the spring and fall. I recommend carbon monoxide detectors in the home as well.  Use your seat belt every time you are in a car, and please drive safely and not be distracted with cell phones and texting while driving.   Tiffany [name] , Thank you for taking time to come for your Medicare Wellness Visit. I appreciate your ongoing  commitment to your health goals. Please review the following plan we discussed and let me know if I can assist you in the future.   These are the goals we discussed: Goals    None      This is a list of the screening recommended for you and due dates:  Health Maintenance  Topic Date Due  . Mammogram  03/07/2019  . Tetanus Vaccine  12/13/2019  . Colon Cancer Screening  11/28/2020  . Flu Shot  Completed  . DEXA scan (bone density measurement)  Completed  .  Hepatitis C: One time screening is recommended by Center for Disease Control  (CDC) for  adults born from [birth year] through 1964.   Completed  . Pneumonia vaccines  Completed   Your mammogram is next due 02/2018 (not 2020 as stated above).  Please call to schedule this. The date above for colonoscopy is incorrect, as you stated you are due for 5 year f/u (the date above is 10 years from your last one).  Since you had polyps, I due recommend calling to schedule colonoscopy.   Bone density is due again 12/2017.  You can do it the same day as your mammogram--call to schedule both for the same day.  We are going to hold off on treating the thinning bones, since you don't tolerate the alendronate that your insurance covers.  If your bones worsen to the point of osteoporosis, we  will need to start treatment with a different medication (Prolia).  Getting regular weight-bearing exercise, in addition to adequate calcium and vitamin D, should help preserve your bone strength.  I recommend getting the new shingles vaccine (Shingrix). You will need to check with your insurance to see if it is covered, and if covered by Medicare Part D, you need to get from the pharmacy rather than our office.  It is a series of 2 injections, spaced 2 months apart.  Follw up with Dr. Bevely [name] his office to reschedule stress test We are going to restart simvastatin regardless of today's results (which you report may be higher than normal due to eating more eggs).  I still  encourage you to have the stress test though.  Please call/email/fax/mail/drop off a list of your blood pressures from home at some point in the next month.  I want to make sure that your blood pressure is really okay.  Please call [facility] to schedule your colonoscopy.  A 5 year follow-up was due in 2017, due to you having colon polyps.  Restart taking 1000 IU of Vitamin D3 in addition to your calcium that has D. Take this every day, long-term.  You are due for you lung cancer screening CT in February--contact them if you don't hear from them soon.  Schedule a follow-up visit with Dr. Ledell Peoples office.  We are restarting your statin medication.  Return in 2 months for a fasting lab visit. (this will be sent in on Tuesday, after seeing your test results from today).

## 2017-12-02 NOTE — Addendum Note (Signed)
Addended by: Carolee Rota F on: 12/02/2017 01:54 PM   Modules accepted: Orders

## 2017-12-03 MED ORDER — SIMVASTATIN 20 MG PO TABS: 20.0000 mg | ORAL_TABLET | Freq: Every day | ORAL | 1 refills | Status: DC

## 2017-12-03 NOTE — Addendum Note (Signed)
Addended byRita Ohara on: 12/03/2017 10:16 AM   Modules accepted: Orders

## 2017-12-05 ENCOUNTER — Ambulatory Visit: Payer: Medicare Other | Admitting: Family Medicine

## 2017-12-05 LAB — COMPREHENSIVE METABOLIC PANEL
AG Ratio: 1.3 (calc) (ref 1.0–2.5)
ALKALINE PHOSPHATASE (APISO): 104 U/L (ref 33–130)
ALT: 11 U/L (ref 6–29)
AST: 18 U/L (ref 10–35)
Albumin: 4.4 g/dL (ref 3.6–5.1)
BILIRUBIN TOTAL: 0.7 mg/dL (ref 0.2–1.2)
BUN: 14 mg/dL (ref 7–25)
CALCIUM: 9.3 mg/dL (ref 8.6–10.4)
CO2: 31 mmol/L (ref 20–32)
Chloride: 98 mmol/L (ref 98–110)
Creat: 0.72 mg/dL (ref 0.60–0.93)
Globulin: 3.3 g/dL (calc) (ref 1.9–3.7)
Glucose, Bld: 91 mg/dL (ref 65–99)
POTASSIUM: 4.6 mmol/L (ref 3.5–5.3)
Sodium: 137 mmol/L (ref 135–146)
Total Protein: 7.7 g/dL (ref 6.1–8.1)

## 2017-12-05 LAB — CBC WITH DIFFERENTIAL/PLATELET
Basophils Absolute: 58 cells/uL (ref 0–200)
Basophils Relative: 1 %
EOS PCT: 1.7 %
Eosinophils Absolute: 99 cells/uL (ref 15–500)
HEMATOCRIT: 41.9 % (ref 35.0–45.0)
Hemoglobin: 15.2 g/dL (ref 11.7–15.5)
LYMPHS ABS: 1670 {cells}/uL (ref 850–3900)
MCH: 33.6 pg — ABNORMAL HIGH (ref 27.0–33.0)
MCHC: 36.3 g/dL — ABNORMAL HIGH (ref 32.0–36.0)
MCV: 92.5 fL (ref 80.0–100.0)
MPV: 10.9 fL (ref 7.5–12.5)
Monocytes Relative: 7.6 %
NEUTROS PCT: 60.9 %
Neutro Abs: 3532 cells/uL (ref 1500–7800)
Platelets: 220 10*3/uL (ref 140–400)
RBC: 4.53 10*6/uL (ref 3.80–5.10)
RDW: 12 % (ref 11.0–15.0)
Total Lymphocyte: 28.8 %
WBC mixed population: 441 cells/uL (ref 200–950)
WBC: 5.8 10*3/uL (ref 3.8–10.8)

## 2017-12-05 LAB — PROTEIN,TOTAL AND ELECTROPHOR W/IFE
ALPHA 2: 0.8 g/dL (ref 0.5–0.9)
Albumin ELP: 4.2 g/dL (ref 3.8–4.8)
Alpha 1: 0.3 g/dL (ref 0.2–0.3)
BETA GLOBULIN: 0.4 g/dL (ref 0.4–0.6)
Beta 2: 0.4 g/dL (ref 0.2–0.5)
GAMMA GLOBULIN: 1.4 g/dL (ref 0.8–1.7)
TOTAL PROTEIN: 7.5 g/dL (ref 6.1–8.1)

## 2017-12-05 LAB — TSH: TSH: 2.74 mIU/L (ref 0.40–4.50)

## 2017-12-05 LAB — LIPID PANEL
CHOL/HDL RATIO: 3.7 (calc) (ref <5.0)
CHOLESTEROL: 199 mg/dL (ref <200)
HDL: 54 mg/dL (ref >50)
LDL CHOLESTEROL (CALC): 122 mg/dL — AB
Non-HDL Cholesterol (Calc): 145 mg/dL (calc) — ABNORMAL HIGH (ref <130)
TRIGLYCERIDES: 122 mg/dL (ref <150)

## 2017-12-05 LAB — VITAMIN D 25 HYDROXY (VIT D DEFICIENCY, FRACTURES): Vit D, 25-Hydroxy: 25 ng/mL — ABNORMAL LOW (ref 30–100)

## 2017-12-05 LAB — IGG, IGA, IGM
IGM, SERUM: 320 mg/dL — AB (ref 48–271)
IgG (Immunoglobin G), Serum: 1363 mg/dL (ref 694–1618)
Immunoglobulin A: 268 mg/dL (ref 81–463)

## 2018-01-07 ENCOUNTER — Encounter (INDEPENDENT_AMBULATORY_CARE_PROVIDER_SITE_OTHER): Payer: Self-pay

## 2018-01-07 ENCOUNTER — Ambulatory Visit (INDEPENDENT_AMBULATORY_CARE_PROVIDER_SITE_OTHER)
Admission: RE | Admit: 2018-01-07 | Discharge: 2018-01-07 | Disposition: A | Discharge status: Home / Self Care | Payer: Medicare Other | Source: Ambulatory Visit | Attending: Acute Care | Admitting: Acute Care

## 2018-01-07 ENCOUNTER — Other Ambulatory Visit: Payer: Self-pay | Admitting: Family Medicine

## 2018-01-07 DIAGNOSIS — F17210 Nicotine dependence, cigarettes, uncomplicated: Secondary | ICD-10-CM

## 2018-01-07 DIAGNOSIS — J449 Chronic obstructive pulmonary disease, unspecified: Secondary | ICD-10-CM

## 2018-01-08 NOTE — Telephone Encounter (Signed)
Is this okay to refill? 

## 2018-01-09 ENCOUNTER — Other Ambulatory Visit: Payer: Self-pay | Admitting: Acute Care

## 2018-01-09 DIAGNOSIS — F17210 Nicotine dependence, cigarettes, uncomplicated: Secondary | ICD-10-CM

## 2018-01-09 DIAGNOSIS — Z122 Encounter for screening for malignant neoplasm of respiratory organs: Secondary | ICD-10-CM

## 2018-01-13 ENCOUNTER — Other Ambulatory Visit: Payer: Self-pay | Admitting: *Deleted

## 2018-01-13 DIAGNOSIS — Z79899 Other long term (current) drug therapy: Secondary | ICD-10-CM

## 2018-01-13 DIAGNOSIS — E7800 Pure hypercholesterolemia, unspecified: Secondary | ICD-10-CM

## 2018-01-20 DIAGNOSIS — H35372 Puckering of macula, left eye: Secondary | ICD-10-CM | POA: Diagnosis not present

## 2018-01-20 DIAGNOSIS — Z961 Presence of intraocular lens: Secondary | ICD-10-CM | POA: Diagnosis not present

## 2018-01-20 DIAGNOSIS — H40023 Open angle with borderline findings, high risk, bilateral: Secondary | ICD-10-CM | POA: Diagnosis not present

## 2018-01-23 ENCOUNTER — Other Ambulatory Visit: Payer: Self-pay | Admitting: Family Medicine

## 2018-01-23 DIAGNOSIS — Z1231 Encounter for screening mammogram for malignant neoplasm of breast: Secondary | ICD-10-CM

## 2018-01-30 ENCOUNTER — Other Ambulatory Visit: Payer: Medicare Other

## 2018-01-30 DIAGNOSIS — E7800 Pure hypercholesterolemia, unspecified: Secondary | ICD-10-CM

## 2018-01-30 DIAGNOSIS — Z79899 Other long term (current) drug therapy: Secondary | ICD-10-CM | POA: Diagnosis not present

## 2018-01-31 LAB — HEPATIC FUNCTION PANEL
ALK PHOS: 126 IU/L — AB (ref 39–117)
ALT: 13 IU/L (ref 0–32)
AST: 18 IU/L (ref 0–40)
Albumin: 4.8 g/dL (ref 3.5–4.8)
BILIRUBIN TOTAL: 0.5 mg/dL (ref 0.0–1.2)
BILIRUBIN, DIRECT: 0.16 mg/dL (ref 0.00–0.40)
Total Protein: 8 g/dL (ref 6.0–8.5)

## 2018-01-31 LAB — LIPID PANEL
Chol/HDL Ratio: 3 ratio (ref 0.0–4.4)
Cholesterol, Total: 163 mg/dL (ref 100–199)
HDL: 54 mg/dL (ref >39)
LDL Calculated: 85 mg/dL (ref 0–99)
Triglycerides: 121 mg/dL (ref 0–149)
VLDL Cholesterol Cal: 24 mg/dL (ref 5–40)

## 2018-02-03 ENCOUNTER — Telehealth: Payer: Self-pay | Admitting: *Deleted

## 2018-02-03 DIAGNOSIS — H5712 Ocular pain, left eye: Secondary | ICD-10-CM | POA: Diagnosis not present

## 2018-02-03 DIAGNOSIS — H53143 Visual discomfort, bilateral: Secondary | ICD-10-CM | POA: Diagnosis not present

## 2018-02-03 NOTE — Telephone Encounter (Signed)
Patient instructed to go to the ER or UC today rather than wait to be seen tomorrow for possible temporal arteritis. Patient refused and insisted on being seen here. Advised to go to ER if symptoms worsen.

## 2018-02-04 ENCOUNTER — Ambulatory Visit: Payer: Medicare Other | Admitting: Family Medicine

## 2018-02-04 VITALS — BP 160/88 | HR 69 | Temp 97.9°F | Wt 146.0 lb

## 2018-02-04 DIAGNOSIS — R51 Headache: Secondary | ICD-10-CM

## 2018-02-04 DIAGNOSIS — R519 Headache, unspecified: Secondary | ICD-10-CM

## 2018-02-04 LAB — CBC WITH DIFFERENTIAL/PLATELET
BASOS ABS: 0 10*3/uL (ref 0.0–0.2)
BASOS: 1 %
EOS (ABSOLUTE): 0.1 10*3/uL (ref 0.0–0.4)
Eos: 1 %
Hematocrit: 44.3 % (ref 34.0–46.6)
Hemoglobin: 15.6 g/dL (ref 11.1–15.9)
IMMATURE GRANS (ABS): 0 10*3/uL (ref 0.0–0.1)
IMMATURE GRANULOCYTES: 0 %
LYMPHS: 23 %
Lymphocytes Absolute: 1.5 10*3/uL (ref 0.7–3.1)
MCH: 33.4 pg — ABNORMAL HIGH (ref 26.6–33.0)
MCHC: 35.2 g/dL (ref 31.5–35.7)
MCV: 95 fL (ref 79–97)
Monocytes Absolute: 0.6 10*3/uL (ref 0.1–0.9)
Monocytes: 10 %
Neutrophils Absolute: 4.3 10*3/uL (ref 1.4–7.0)
Neutrophils: 65 %
PLATELETS: 232 10*3/uL (ref 150–379)
RBC: 4.67 x10E6/uL (ref 3.77–5.28)
RDW: 13 % (ref 12.3–15.4)
WBC: 6.5 10*3/uL (ref 3.4–10.8)

## 2018-02-04 LAB — HIGH SENSITIVITY CRP: CRP, High Sensitivity: 5.2 mg/L — ABNORMAL HIGH (ref 0.00–3.00)

## 2018-02-04 LAB — SEDIMENTATION RATE: SED RATE: 54 mm/h — AB (ref 0–40)

## 2018-02-04 MED ORDER — PREDNISONE 20 MG PO TABS
20.0000 mg | ORAL_TABLET | Freq: Every day | ORAL | 0 refills | Status: DC
Start: 2018-02-04 — End: 2018-02-19

## 2018-02-04 NOTE — Addendum Note (Signed)
Addended by: Denita Lung on: 02/04/2018 04:40 PM   Modules accepted: Orders

## 2018-02-04 NOTE — Progress Notes (Addendum)
   Subjective:    Patient ID: Katrin Grabel [place], female    DOB: 1946/10/05, 72 y.o.   MRN: [id number]  HPI Last Friday she noted the rather abrupt onset of left temporal stabbing type pain but no blurred vision, double vision, vomiting.  She did have some nausea.  She has noted no rash.  She was seen by her optometrist and sent here for further evaluation.  No upper extremity weakness.   Review of Systems     Objective:   Physical Exam Alert and in no distress.  EOMI.  No tenderness to palpation to the forehead or temporal area.  No tenderness over the temporal artery.  TMs are clear.  No rash noted.  Neck is supple without adenopathy.       Assessment & Plan:  Nonintractable headache, unspecified chronicity pattern, unspecified headache type - Plan: CBC with Differential/Platelet, Sedimentation rate, High sensitivity CRP My concern is for temporal arteritis and will therefore evaluate her for that.  Until results come in recommend 800 mg ibuprofen 3 times per day. CRP is 5.4. I am concerned that this could be temporal arteritis although at this point not enough diagnostic criteria to cover everything.  I will start her out on prednisone and get her back next week.

## 2018-02-05 ENCOUNTER — Telehealth: Payer: Self-pay | Admitting: Family Medicine

## 2018-02-05 NOTE — Telephone Encounter (Signed)
New Message  Pt verbalized wondering if we found anything during her OV yesterday and wants to speak to the nurse.  Please f/u with pt

## 2018-02-05 NOTE — Telephone Encounter (Signed)
This was sent to me, do you want me to tell her anything specific?

## 2018-02-05 NOTE — Telephone Encounter (Signed)
Take care of this 

## 2018-02-05 NOTE — Telephone Encounter (Signed)
Pt was notified.  

## 2018-02-05 NOTE — Addendum Note (Signed)
Addended by: Denita Lung on: 02/05/2018 01:39 PM   Modules accepted: Level of Service

## 2018-02-09 NOTE — Progress Notes (Signed)
Chief Complaint  Patient presents with  . Follow-up    from vsit last week. Overall feeling much better. Just enough of a HA there to aggrevate her.     Patient presents for f/u on her headaches.  She was put on prednisone, due to the concern for possible temporal arteritis (though her ESR wasn't as high as would be expected). She had seen her eye doctor on 3/11 with severe left temporal headache (no actual visual complaints), who wanted her to be seen same day; she would not go to an urgent care, and saw Dr. Redmond School here on 02/04/18. She was started on prednisone.  It is not well documented in chart, but rx says 32m daily, so that is what patient has been taking (Dr. LRedmond Schooltold me that he recommended she take 665mdaily, was verbally told this on phone, but pt states had such a bad headache, she didn't recall being told this, so only took what it said on the bottle).  Her head feels a lot better since taking the prednisone. Taking pain medication just at night--the pain sometime wakes her up at night (at first). Currently pain is 4/10.  Pain worse in the morning, and at night. After taking prednisone with breakfast, it eases off. She has never had visual problems jaw pain or other associated symptoms with her headache.  Lab Results  Component Value Date   WBC 6.5 02/04/2018   HGB 15.6 02/04/2018   HCT 44.3 02/04/2018   MCV 95 02/04/2018   PLT 232 02/04/2018   Lab Results  Component Value Date   ESRSEDRATE 54 (H) 02/04/2018   Hs-CRP elevated at 5.20.  PMH, PSPoteetH reviewed  Outpatient Encounter Medications as of 02/10/2018  Medication Sig  . albuterol (PROVENTIL HFA;VENTOLIN HFA) 108 (90 Base) MCG/ACT inhaler Inhale 2 puffs into the lungs every 6 (six) hours as needed for wheezing or shortness of breath.  . Calcium-Vitamin D (CALTRATE 600 PLUS-VIT D PO) Take 1 each by mouth daily.  . Marland KitchenxyCODONE-acetaminophen (PERCOCET/ROXICET) 5-325 MG tablet Take 1 tablet by mouth every 4 (four)  hours as needed for severe pain.  . predniSONE (DELTASONE) 20 MG tablet Take 1 tablet (20 mg total) by mouth daily with breakfast.  . aspirin EC 81 MG tablet Take 1 tablet (81 mg total) by mouth daily.  . simvastatin (ZOCOR) 20 MG tablet Take 1 tablet (20 mg total) by mouth at bedtime. (Patient not taking: Reported on 02/10/2018)   No facility-administered encounter medications on file as of 02/10/2018.    (stopped her simvastatin, didn't think she could take with her pain med).  No Known Allergies  ROS:  Headache per HPI. No other neuro symptoms.   No fever, chills, URI symptoms, cough, chest pain, shortness of breath, bleeding, bruising, rash or other complaints.  PHYSICAL EXAM:  BP (!) 180/100   Pulse 72   Ht 5' 5.5" (1.664 m)   Wt 147 lb (66.7 kg)   BMI 24.09 kg/m   160/74 on repeat by MD today  BP Readings from Last 3 Encounters:  02/10/18 (!) 180/100  02/04/18 (!) 160/88  12/02/17 140/74  had been 148/96 by the nurse at her 12/02/17 visit.  Well appearing, pleasant female, accompanied by her husband.  She appears to be in good spirits, in no distress HEENT: PERRL, EOMI, conjunctiva and sclera are clear, EOMI.  Fundi benign. nontender at temporalis muscle or over temporal artery. OP is clear, sinuses nontender Neck: no lymphadenopathy, mass or bruit Heart:  regular rate and rhythm Lungs: clear bilaterally Extremities: no edema Skin: normal turgor, no rash Neuro: alert and oriented, cranial nerves intact, normal gait Psych: normal mood, affect, hygiene and grooming   ASSESSMENT/PLAN:  Nonintractable headache, unspecified chronicity pattern, unspecified headache type - L temporal headache; improved with 39m prednisone daily, but not resolved. need to r/o TA. nontender at TA today, but needs bx. Increase pred to 61mqd - Plan: Sedimentation Rate, C-reactive protein  Essential hypertension - has had h/o elevated BP's, has remained higher, high today even with only mild  headache.  Start losartan 5065misks/SE reviewed, low Na diet - Plan: losartan (COZAAR) 50 MG tablet    F/u 10-14 days, with list of BP's.  REFER FOR TEMP A BX   Stop taking the pain medication at night. You can use tylenol (any strength) if needed for pain. We are repeating your blood tests today. It sounds like you have partial improvement with the low dose prednisone--I think your dose needs to be higher. Increase the prednisone to 3 tablets with breakfast (8m108mse), once daily  Your blood pressure remains high--unclear if this can also be contributing to your current headache.  Start taking losartan 50mg50me daily for blood pressure. Monitor blood pressure at home. Bring your list to your next appointment. Cut back on the salt in your diet (ham, and other processed foods, canned vegetables, etc.).

## 2018-02-10 ENCOUNTER — Encounter: Payer: Self-pay | Admitting: Family Medicine

## 2018-02-10 ENCOUNTER — Ambulatory Visit: Payer: Medicare Other | Admitting: Family Medicine

## 2018-02-10 VITALS — BP 160/74 | HR 72 | Ht 65.5 in | Wt 147.0 lb

## 2018-02-10 DIAGNOSIS — I10 Essential (primary) hypertension: Secondary | ICD-10-CM | POA: Diagnosis not present

## 2018-02-10 DIAGNOSIS — R51 Headache: Secondary | ICD-10-CM | POA: Diagnosis not present

## 2018-02-10 DIAGNOSIS — R519 Headache, unspecified: Secondary | ICD-10-CM

## 2018-02-10 MED ORDER — LOSARTAN POTASSIUM 50 MG PO TABS: 50.0000 mg | ORAL_TABLET | Freq: Every day | ORAL | 3 refills | Status: DC

## 2018-02-10 NOTE — Patient Instructions (Signed)
Stop taking the pain medication at night. You can use tylenol (any strength) if needed for pain. We are repeating your blood tests today. It sounds like you have partial improvement with the low dose prednisone--I think your dose needs to be higher. Increase the prednisone to 3 tablets with breakfast (60mg  dose), once daily  Your blood pressure remains high--unclear if this can also be contributing to your current headache.  Start taking losartan 50mg  once daily for blood pressure. Monitor blood pressure at home. Bring your list to your next appointment. Cut back on the salt in your diet (ham, and other processed foods, canned vegetables, etc.).     Low-Sodium Eating Plan Sodium, which is an element that makes up salt, helps you maintain a healthy balance of fluids in your body. Too much sodium can increase your blood pressure and cause fluid and waste to be held in your body. Your health care provider or dietitian may recommend following this plan if you have high blood pressure (hypertension), kidney disease, liver disease, or heart failure. Eating less sodium can help lower your blood pressure, reduce swelling, and protect your heart, liver, and kidneys. What are tips for following this plan? General guidelines  Most people on this plan should limit their sodium intake to 1,500-2,000 mg (milligrams) of sodium each day. Reading food labels  The Nutrition Facts label lists the amount of sodium in one serving of the food. If you eat more than one serving, you must multiply the listed amount of sodium by the number of servings.  Choose foods with less than 140 mg of sodium per serving.  Avoid foods with 300 mg of sodium or more per serving. Shopping  Look for lower-sodium products, often labeled as "low-sodium" or "no salt added."  Always check the sodium content even if foods are labeled as "unsalted" or "no salt added".  Buy fresh foods. ? Avoid canned foods and premade or  frozen meals. ? Avoid canned, cured, or processed meats  Buy breads that have less than 80 mg of sodium per slice. Cooking  Eat more home-cooked food and less restaurant, buffet, and fast food.  Avoid adding salt when cooking. Use salt-free seasonings or herbs instead of table salt or sea salt. Check with your health care provider or pharmacist before using salt substitutes.  Cook with plant-based oils, such as canola, sunflower, or olive oil. Meal planning  When eating at a restaurant, ask that your food be prepared with less salt or no salt, if possible.  Avoid foods that contain MSG (monosodium glutamate). MSG is sometimes added to [place] food, bouillon, and some canned foods. What foods are recommended? The items listed may not be a complete list. Talk with your dietitian about what dietary choices are best for you. Grains Low-sodium cereals, including oats, puffed wheat and rice, and shredded wheat. Low-sodium crackers. Unsalted rice. Unsalted pasta. Low-sodium bread. Whole-grain breads and whole-grain pasta. Vegetables Fresh or frozen vegetables. "No salt added" canned vegetables. "No salt added" tomato sauce and paste. Low-sodium or reduced-sodium tomato and vegetable juice. Fruits Fresh, frozen, or canned fruit. Fruit juice. Meats and other protein foods Fresh or frozen (no salt added) meat, poultry, seafood, and fish. Low-sodium canned tuna and salmon. Unsalted nuts. Dried peas, beans, and lentils without added salt. Unsalted canned beans. Eggs. Unsalted nut butters. Dairy Milk. Soy milk. Cheese that is naturally low in sodium, such as ricotta cheese, fresh mozzarella, or Swiss cheese Low-sodium or reduced-sodium cheese. Cream cheese. Yogurt. Fats and  oils Unsalted butter. Unsalted margarine with no trans fat. Vegetable oils such as canola or olive oils. Seasonings and other foods Fresh and dried herbs and spices. Salt-free seasonings. Low-sodium mustard and ketchup.  Sodium-free salad dressing. Sodium-free light mayonnaise. Fresh or refrigerated horseradish. Lemon juice. Vinegar. Homemade, reduced-sodium, or low-sodium soups. Unsalted popcorn and pretzels. Low-salt or salt-free chips. What foods are not recommended? The items listed may not be a complete list. Talk with your dietitian about what dietary choices are best for you. Grains Instant hot cereals. Bread stuffing, pancake, and biscuit mixes. Croutons. Seasoned rice or pasta mixes. Noodle soup cups. Boxed or frozen macaroni and cheese. Regular salted crackers. Self-rising flour. Vegetables Sauerkraut, pickled vegetables, and relishes. Olives. [place] fries. Onion rings. Regular canned vegetables (not low-sodium or reduced-sodium). Regular canned tomato sauce and paste (not low-sodium or reduced-sodium). Regular tomato and vegetable juice (not low-sodium or reduced-sodium). Frozen vegetables in sauces. Meats and other protein foods Meat or fish that is salted, canned, smoked, spiced, or pickled. Bacon, ham, sausage, hotdogs, corned beef, chipped beef, packaged lunch meats, salt pork, jerky, pickled herring, anchovies, regular canned tuna, sardines, salted nuts. Dairy Processed cheese and cheese spreads. Cheese curds. Blue cheese. Feta cheese. String cheese. Regular cottage cheese. Buttermilk. Canned milk. Fats and oils Salted butter. Regular margarine. Ghee. Bacon fat. Seasonings and other foods Onion salt, garlic salt, seasoned salt, table salt, and sea salt. Canned and packaged gravies. Worcestershire sauce. Tartar sauce. Barbecue sauce. Teriyaki sauce. Soy sauce, including reduced-sodium. Steak sauce. Fish sauce. Oyster sauce. Cocktail sauce. Horseradish that you find on the shelf. Regular ketchup and mustard. Meat flavorings and tenderizers. Bouillon cubes. Hot sauce and Tabasco sauce. Premade or packaged marinades. Premade or packaged taco seasonings. Relishes. Regular salad dressings. Salsa. Potato and  tortilla chips. Corn chips and puffs. Salted popcorn and pretzels. Canned or dried soups. Pizza. Frozen entrees and pot pies. Summary  Eating less sodium can help lower your blood pressure, reduce swelling, and protect your heart, liver, and kidneys.  Most people on this plan should limit their sodium intake to 1,500-2,000 mg (milligrams) of sodium each day.  Canned, boxed, and frozen foods are high in sodium. Restaurant foods, fast foods, and pizza are also very high in sodium. You also get sodium by adding salt to food.  Try to cook at home, eat more fresh fruits and vegetables, and eat less fast food, canned, processed, or prepared foods. This information is not intended to replace advice given to you by your health care provider. Make sure you discuss any questions you have with your health care provider. Document Released: 05/04/2002 Document Revised: 11/05/2016 Document Reviewed: 11/05/2016 Elsevier Interactive Patient Education  [employer].

## 2018-02-11 LAB — SEDIMENTATION RATE: SED RATE: 50 mm/h — AB (ref 0–40)

## 2018-02-11 LAB — C-REACTIVE PROTEIN: CRP: 1.5 mg/L (ref 0.0–4.9)

## 2018-02-12 ENCOUNTER — Ambulatory Visit: Payer: Medicare Other | Admitting: Family Medicine

## 2018-02-13 DIAGNOSIS — R51 Headache: Secondary | ICD-10-CM | POA: Diagnosis not present

## 2018-02-14 ENCOUNTER — Other Ambulatory Visit: Payer: Self-pay | Admitting: General Surgery

## 2018-02-14 DIAGNOSIS — R51 Headache: Secondary | ICD-10-CM | POA: Diagnosis not present

## 2018-02-19 ENCOUNTER — Telehealth: Payer: Self-pay | Admitting: *Deleted

## 2018-02-19 MED ORDER — PREDNISONE 10 MG PO TABS: 10.0000 mg | ORAL_TABLET | ORAL | 0 refills | Status: DC

## 2018-02-19 NOTE — Telephone Encounter (Signed)
Patient called and is out of prednisone and wants to know if you are putting her on more.

## 2018-02-19 NOTE — Telephone Encounter (Signed)
I just saw that the biopsy results showed no temporal arteritis, therefore we should taper her off prednisone.  She needs it refilled with the following directions (I believe she has been taking 3/day): Take 2 x 2d, 1.5 tablets x 2 d, 1 tablet x 2d, 1/2 tablet x 2 days then stop. If it would be easier for her, you can change to 10mg  tablets, and directions would be 4/4/3/3/2/2/1/1. The prednisone is likely also helping her breathing, so if we never got the info from her about the preferred respiratory meds, we need to be sure to start that (see prior messages, likely under lab results)

## 2018-02-19 NOTE — Telephone Encounter (Signed)
Patient advised of medication being called in and how to take. She is coming in Monday for med check and is going to bring her new book for 2019 so the Anoro can be replaced. I asked to please not forget-she verbalized understanding.

## 2018-02-23 NOTE — Progress Notes (Signed)
Chief Complaint  Patient presents with  . Hypertension    follow up on hypertension, brought in a log today. Patient could not find her insurance book but she did call. Anoro was the actual recommended inhaler, the reason it was $400 last time was because deductible was not met.,    Patient presents for f/u on hypertension. She was started on losartan '50mg'$  at her last visit on 3/18.  BP's have been running 123/69-144/70 in the last week. She denies side effects.  At her last visit we had bumped up her prednisone to '60mg'$  daily until we got back her negative temporal artery biopsy.  She was subsequently put on a taper, currently on 1.5 tablets of the taper.  Headache got better with the prednisone. Saturday evening, after cleaning her house, she had a slight headache across her forehead--a "regular" headache, relieved by Advil.  She hasn't had any recurrence of the temporal headache.  She is also here to address her COPD. She had been started on Anoro samples back in June 2018, and did notice benefit.  Last spirometry was 11/2017, showing severe airway obstruction, with low vital capacity.  Anoro was prescribed at that visit, but pt states it was too expensive. She couldn't find her insurance book to see which meds were covered, so she called her insurance, was told Anoro was covered.  Asked about why it was so expensive the last time she tried to get it filled, was told she hadn't met her deductible, which reportedly isn't an issue currently.  Her breathing was better while on higher doses of prednisone. She needed to use albuterol yesterday when carrying her groceries up the hill to her house. She got good relief from the albuterol.  She did not need to use albuterol when coming up the hill when she was on the higher doses of prednisone.  Hyperlipidemia, DOE and cardiac risk factors:  She restarted her simvastatin after her visit in January. Prior to restarting, LDL was 122.  Recheck after being back on  simvastatin was improved, and LFT's also done and okay. Lab Results  Component Value Date   CHOL 163 01/30/2018   HDL 54 01/30/2018   LDLCALC 85 01/30/2018   TRIG 121 01/30/2018   CHOLHDL 3.0 01/30/2018   PMH, PSH, SH reviewed  Outpatient Encounter Medications as of 02/24/2018  Medication Sig Note  . albuterol (PROVENTIL HFA;VENTOLIN HFA) 108 (90 Base) MCG/ACT inhaler Inhale 2 puffs into the lungs every 6 (six) hours as needed for wheezing or shortness of breath.   . Calcium-Vitamin D (CALTRATE 600 PLUS-VIT D PO) Take 1 each by mouth daily.   Marland Kitchen losartan (COZAAR) 50 MG tablet Take 1 tablet (50 mg total) by mouth daily.   . predniSONE (DELTASONE) 10 MG tablet Take 1 tablet (10 mg total) by mouth as directed. Take 2 x 2d, 1.5 tablets x 2 d, 1 tablet x 2d, 1/2 tablet x 2 days then stop. 02/24/2018: Taking 1.5 tablets currently  . simvastatin (ZOCOR) 20 MG tablet Take 1 tablet (20 mg total) by mouth at bedtime.   Marland Kitchen aspirin EC 81 MG tablet Take 1 tablet (81 mg total) by mouth daily.   Marland Kitchen oxyCODONE-acetaminophen (PERCOCET/ROXICET) 5-325 MG tablet Take 1 tablet by mouth every 4 (four) hours as needed for severe pain.   Marland Kitchen umeclidinium-vilanterol (ANORO ELLIPTA) 62.5-25 MCG/INH AEPB Inhale 1 puff into the lungs daily.    No facility-administered encounter medications on file as of 02/24/2018.    No Known  Allergies  ROS: no fever, chills, chest pain.  DOE per baseline.  No longer having sinus pain/pressure, just some runny nose. Denies headaches (just frontal one 2 days ago).   See HPI   PHYSICAL EXAM:  BP (!) 142/78   Pulse 80   Ht 5' 5.5" (1.664 m)   Wt 145 lb (65.8 kg)   BMI 23.76 kg/m   144/70 on repeat by MD  Wt Readings from Last 3 Encounters:  02/10/18 147 lb (66.7 kg)  02/04/18 146 lb (66.2 kg)  12/02/17 149 lb (67.6 kg)    BP Readings from Last 3 Encounters:  02/10/18 (!) 160/74  02/04/18 (!) 160/88  12/02/17 140/74   HEENT: conjunctiva and sclera are clear.  Healing  incision at left temple (minimal scab, no erythema), nontender Neck: no lymphadenopathy or mass Heart: regular rate and rhythm Lungs: somewhat distant. No wheezes, rales, ronchi Extremities: no edema Skin: normal turgor, somewhat dry Psych: normal mood, affect, hygiene and grooming Neuro: alert and oriented, cranial nerves intact, normal gait   ASSESSMENT/PLAN:  Essential hypertension - borderline today, some lower values at home; encouraged daily exercise, low sodium diet, continue monitoring. Bring list to f/u in July, return sooner if higher - Plan: Basic metabolic panel  Chronic obstructive pulmonary disease, unspecified COPD type ([place]) - anoro should be covered, restart. - Plan: umeclidinium-vilanterol (ANORO ELLIPTA) 62.5-25 MCG/INH AEPB  Tobacco dependence - encouraged cessation  Medication monitoring encounter - Plan: Basic metabolic panel  Nonintractable headache, unspecified chronicity pattern, unspecified headache type - headache resolved, reassured no TA. ?if elevated BP was due to HA vs contributing.    F/u as scheduled in July  Continue your losartan '50mg'$  daily. Continue to monitor your blood pressure and bring the list to your next visit in July.  Call or return sooner if your blood pressure is consistently over 140/90. Low sodium diet and regular exercise also helps keep the blood pressure down.  Start the [employer] and use daily. Please consider working on cutting back and quitting smoking.

## 2018-02-24 ENCOUNTER — Ambulatory Visit: Payer: Medicare Other | Admitting: Family Medicine

## 2018-02-24 ENCOUNTER — Encounter: Payer: Self-pay | Admitting: Family Medicine

## 2018-02-24 VITALS — BP 142/78 | HR 80 | Ht 65.5 in | Wt 145.0 lb

## 2018-02-24 DIAGNOSIS — R51 Headache: Secondary | ICD-10-CM

## 2018-02-24 DIAGNOSIS — Z5181 Encounter for therapeutic drug level monitoring: Secondary | ICD-10-CM

## 2018-02-24 DIAGNOSIS — I10 Essential (primary) hypertension: Secondary | ICD-10-CM | POA: Diagnosis not present

## 2018-02-24 DIAGNOSIS — F17200 Nicotine dependence, unspecified, uncomplicated: Secondary | ICD-10-CM

## 2018-02-24 DIAGNOSIS — J449 Chronic obstructive pulmonary disease, unspecified: Secondary | ICD-10-CM

## 2018-02-24 DIAGNOSIS — R519 Headache, unspecified: Secondary | ICD-10-CM

## 2018-02-24 MED ORDER — UMECLIDINIUM-VILANTEROL 62.5-25 MCG/INH IN AEPB: 1.0000 | INHALATION_SPRAY | Freq: Every day | RESPIRATORY_TRACT | 5 refills | Status: DC

## 2018-02-24 NOTE — Patient Instructions (Addendum)
Finish out the prednisone as recommended (taper down).  Continue your losartan 50mg  daily. Continue to monitor your blood pressure and bring the list to your next visit in July.  Call or return sooner if your blood pressure is consistently over 140/90. Low sodium diet and regular exercise also helps keep the blood pressure down.  Start the [employer] and use daily. Please consider working on cutting back and quitting smoking.

## 2018-02-25 LAB — BASIC METABOLIC PANEL
BUN / CREAT RATIO: 22 (ref 12–28)
BUN: 17 mg/dL (ref 8–27)
CHLORIDE: 100 mmol/L (ref 96–106)
CO2: 22 mmol/L (ref 20–29)
Calcium: 9.1 mg/dL (ref 8.7–10.3)
Creatinine, Ser: 0.76 mg/dL (ref 0.57–1.00)
GFR calc non Af Amer: 80 mL/min/{1.73_m2} (ref >59)
GFR, EST AFRICAN AMERICAN: 92 mL/min/{1.73_m2} (ref >59)
Glucose: 84 mg/dL (ref 65–99)
POTASSIUM: 4.3 mmol/L (ref 3.5–5.2)
SODIUM: 143 mmol/L (ref 134–144)

## 2018-03-07 ENCOUNTER — Ambulatory Visit
Admission: RE | Admit: 2018-03-07 | Discharge: 2018-03-07 | Disposition: A | Discharge status: Home / Self Care | Payer: Medicare Other | Source: Ambulatory Visit | Attending: Family Medicine | Admitting: Family Medicine

## 2018-03-07 DIAGNOSIS — Z1231 Encounter for screening mammogram for malignant neoplasm of breast: Secondary | ICD-10-CM | POA: Diagnosis not present

## 2018-03-07 DIAGNOSIS — F17200 Nicotine dependence, unspecified, uncomplicated: Secondary | ICD-10-CM

## 2018-03-07 DIAGNOSIS — M8580 Other specified disorders of bone density and structure, unspecified site: Secondary | ICD-10-CM

## 2018-03-07 DIAGNOSIS — M8589 Other specified disorders of bone density and structure, multiple sites: Secondary | ICD-10-CM | POA: Diagnosis not present

## 2018-03-07 DIAGNOSIS — Z780 Asymptomatic menopausal state: Secondary | ICD-10-CM | POA: Diagnosis not present

## 2018-04-10 ENCOUNTER — Telehealth: Payer: Self-pay | Admitting: *Deleted

## 2018-04-10 NOTE — Telephone Encounter (Signed)
I have no record of muscle relaxants in her chart.  We cannot rx these without evaluating her.  She can go to an UC this evening if she cannot wait to be seen by someone tomorrow. She should try heat, massage, stretches in the interim

## 2018-04-10 NOTE — Telephone Encounter (Signed)
Patient advised.

## 2018-04-10 NOTE — Telephone Encounter (Signed)
Patient called and pulled muscle in her back lower left side. Leaving to go out of town early in the am. She wants to know if you can call her I a muscle relaxant as she has had one in the past-cannot recall name and was not rx'd by you. I could not offer her an appt as there were none available with any providers. Please advise.

## 2018-04-17 ENCOUNTER — Encounter: Payer: Self-pay | Admitting: Family Medicine

## 2018-04-17 ENCOUNTER — Ambulatory Visit: Payer: Medicare Other | Admitting: Family Medicine

## 2018-04-17 VITALS — BP 118/68 | HR 72 | Temp 98.0°F | Ht 65.5 in | Wt 145.0 lb

## 2018-04-17 DIAGNOSIS — M545 Low back pain: Secondary | ICD-10-CM | POA: Diagnosis not present

## 2018-04-17 DIAGNOSIS — M533 Sacrococcygeal disorders, not elsewhere classified: Secondary | ICD-10-CM

## 2018-04-17 LAB — POCT URINALYSIS DIP (PROADVANTAGE DEVICE)
BILIRUBIN UA: NEGATIVE
BILIRUBIN UA: NEGATIVE mg/dL
Blood, UA: NEGATIVE
GLUCOSE UA: NEGATIVE mg/dL
LEUKOCYTES UA: NEGATIVE
Nitrite, UA: NEGATIVE
PROTEIN UA: NEGATIVE mg/dL
SPECIFIC GRAVITY, URINE: 1.02
Urobilinogen, Ur: NEGATIVE
pH, UA: 6 (ref 5.0–8.0)

## 2018-04-17 MED ORDER — MELOXICAM 15 MG PO TABS: 15.0000 mg | ORAL_TABLET | Freq: Every day | ORAL | 0 refills | Status: DC

## 2018-04-17 NOTE — Progress Notes (Signed)
Chief Complaint  Patient presents with  . Back Pain    left sided low back pain x 2 weeks. Has tried ES Tylenol, IBU, heating pad, hot water soaks in the tub, took flexeril did not work, tried bio freeze-none of these thigs helped.    2 weeks ago she started with left lower back pain. Denies any known injury, change in activity, fall.  Doesn't recall how/when it started.    She took a neighbor's flexeril (every 6 hours)--didn't help with the pain.  Didn't make her sleepy or have side effects. (doesn't think it was old). She recalls it easing off some initially, but it would come right back.  She also took tylenol and ibuprofen (OTC), as well as tried heating pad, soaked in hot tub, nothing seemed to help.  Reports pain is 7/10 currently (initially reported it was 9--realized it was not close to being as bad as her headache was, which she rated 10/10, so changed pain scale to 7 currently).  She recalls having this in the past, many years ago. She recalls taking tylenol #3 and that helped.  PMH, PSH, SH reviewed  Outpatient Encounter Medications as of 04/17/2018  Medication Sig Note  . aspirin EC 81 MG tablet Take 1 tablet (81 mg total) by mouth daily.   . Calcium-Vitamin D (CALTRATE 600 PLUS-VIT D PO) Take 1 each by mouth daily.   Marland Kitchen losartan (COZAAR) 50 MG tablet Take 1 tablet (50 mg total) by mouth daily.   . simvastatin (ZOCOR) 20 MG tablet Take 1 tablet (20 mg total) by mouth at bedtime.   Marland Kitchen albuterol (PROVENTIL HFA;VENTOLIN HFA) 108 (90 Base) MCG/ACT inhaler Inhale 2 puffs into the lungs every 6 (six) hours as needed for wheezing or shortness of breath. (Patient not taking: Reported on 04/17/2018)   . umeclidinium-vilanterol (ANORO ELLIPTA) 62.5-25 MCG/INH AEPB Inhale 1 puff into the lungs daily. (Patient not taking: Reported on 04/17/2018) 04/17/2018: Pt states insurance wouldn't cover  . [DISCONTINUED] oxyCODONE-acetaminophen (PERCOCET/ROXICET) 5-325 MG tablet Take 1 tablet by mouth every 4  (four) hours as needed for severe pain.   . [DISCONTINUED] predniSONE (DELTASONE) 10 MG tablet Take 1 tablet (10 mg total) by mouth as directed. Take 2 x 2d, 1.5 tablets x 2 d, 1 tablet x 2d, 1/2 tablet x 2 days then stop. 02/24/2018: Taking 1.5 tablets currently   No facility-administered encounter medications on file as of 04/17/2018.    She plans to contact insurance re: anoro/substitute.  No Known Allergies  ROS: no fever, chills, headaches, dizziness, chest pain, URI symptoms. No urinary complaints, fever, chills.  No nausea, vomiting, diarrhea, rash. Denies radiation of pain, numbness, tingling, weakness, incontinence.  PHYSICAL EXAM: BP 118/68   Pulse 72   Temp 98 F (36.7 C)   Ht 5' 5.5" (1.664 m)   Wt 145 lb (65.8 kg)   BMI 23.76 kg/m   Well appearing female, who appears comfortable while seated. Back: Spine nontender Tender at L SI joint with hypermobility noted with forward flexion. Slightly tender at paraspinous muscles on the left, above the SI joint. No significant spasm noted.  Normal strength, DTR's, sensation, gait No pyriformis spasm, no pain with stretch. Extremities: no clubbing, cyanosis or edema.  She has been sitting for a while, and feet have purplish discoloration. Brisk cap refill, warm,   ASSESSMENT/PLAN:  SI (sacroiliac) joint dysfunction - Plan: meloxicam (MOBIC) 15 MG tablet  Left low back pain, unspecified chronicity, with sciatica presence unspecified - Plan: POCT Urinalysis  DIP (Proadvantage Device)  Risks/side effects of meds reviewed in detail. rec chiro in addition to NSAID. F/u if symptoms persist/worsen, especially if neuro symptoms or radiation develops.   SI joint dysfunction.  Take meloxicam once daily with food.  If it bothers your stomach, you can cut the dose in half.  Take it regularly until your pain is completely better (it is not an antibiotic--you don't need to finish the bottle if your pain gets better).  Do NOT use any  ibuprofen/advil/motrin/aleve/Goody/BC powder along with this medication. You CAN still take tylenol if needed.  You can try ice versus heat, whichever is more effective, sometimes alternating works the best.  You can also try topical medications like Biofreeze or [place].  If these things are not helping, I think you would benefit from seeing a chiropractor, for adjustment of the SI joint.  Dr. Hardin Negus and his partner at [facility] on [address] are very good, however you may want to check and make sure they take your insurance, and if they do not, you can always check directly with your insurance to find a chiropractor.

## 2018-04-17 NOTE — Patient Instructions (Signed)
SI joint dysfunction.  Take meloxicam once daily with food.  If it bothers your stomach, you can cut the dose in half.  Take it regularly until your pain is completely better (it is not an antibiotic--you don't need to finish the bottle if your pain gets better).  Do NOT use any ibuprofen/advil/motrin/aleve/Goody/BC powder along with this medication. You CAN still take tylenol if needed.  You can try ice versus heat, whichever is more effective, sometimes alternating works the best.  You can also try topical medications like Biofreeze or [place].  If these things are not helping, I think you would benefit from seeing a chiropractor, for adjustment of the SI joint.  Dr. Hardin Negus and his partner at [facility] on [address] are very good, however you may want to check and make sure they take your insurance, and if they do not, you can always check directly with your insurance to find a chiropractor.  Sacroiliac Joint Dysfunction Sacroiliac joint dysfunction is a condition that causes inflammation on one or both sides of the sacroiliac (SI) joint. The SI joint connects the lower part of the spine (sacrum) with the two upper portions of the pelvis (ilium). This condition causes deep aching or burning pain in the low back. In some cases, the pain may also spread into one or both buttocks or hips or spread down the legs. What are the causes? This condition may be caused by:  Pregnancy. During pregnancy, extra stress is put on the SI joints because the pelvis widens.  Injury, such as: ? Car accidents. ? Sport-related injuries. ? Work-related injuries.  Having one leg that is shorter than the other.  Conditions that affect the joints, such as: ? Rheumatoid arthritis. ? Gout. ? Psoriatic arthritis. ? Joint infection (septic arthritis).  Sometimes, the cause of SI joint dysfunction is not known. What are the signs or symptoms? Symptoms of this condition  include:  Aching or burning pain in the lower back. The pain may also spread to other areas, such as: ? Buttocks. ? Groin. ? Thighs and legs.  Muscle spasms in or around the painful areas.  Increased pain when standing, walking, running, stair climbing, bending, or lifting.  How is this diagnosed? Your health care provider will do a physical exam and take your medical history. During the exam, the health care provider may move one or both of your legs to different positions to check for pain. Various tests may be done to help verify the diagnosis, including:  Imaging tests to look for other causes of pain. These may include: ? MRI. ? CT scan. ? Bone scan.  Diagnostic injection. A numbing medicine is injected into the SI joint using a needle. If the pain is temporarily improved or stopped after the injection, this can indicate that SI joint dysfunction is the problem.  How is this treated? Treatment may vary depending on the cause and severity of your condition. Treatment options may include:  Applying ice or heat to the lower back area. This can help to reduce pain and muscle spasms.  Medicines to relieve pain or inflammation or to relax the muscles.  Wearing a back brace (sacroiliac brace) to help support the joint while your back is healing.  Physical therapy to increase muscle strength around the joint and flexibility at the joint. This may also involve learning proper body positions and ways of moving to relieve stress on the joint.  Direct manipulation of the SI joint.  Injections  of steroid medicine into the joint in order to reduce pain and swelling.  Radiofrequency ablation to burn away nerves that are carrying pain messages from the joint.  Use of a device that provides electrical stimulation in order to reduce pain at the joint.  Surgery to put in screws and plates that limit or prevent joint motion. This is rare.  Follow these instructions at home:  Rest as  needed. Limit your activities as directed by your health care provider.  Take medicines only as directed by your health care provider.  If directed, apply ice to the affected area: ? Put ice in a plastic bag. ? Place a towel between your skin and the bag. ? Leave the ice on for 20 minutes, 2-3 times per day.  Use a heating pad or a moist heat pack as directed by your health care provider.  Exercise as directed by your health care provider or physical therapist.  Keep all follow-up visits as directed by your health care provider. This is important. Contact a health care provider if:  Your pain is not controlled with medicine.  You have a fever.  You have increasingly severe pain. Get help right away if:  You have weakness, numbness, or tingling in your legs or feet.  You lose control of your bladder or bowel. This information is not intended to replace advice given to you by your health care provider. Make sure you discuss any questions you have with your health care provider. Document Released: 02/08/2009 Document Revised: 04/19/2016 Document Reviewed: 07/20/2014 Elsevier Interactive Patient Education  [employer].

## 2018-05-28 ENCOUNTER — Other Ambulatory Visit: Payer: Self-pay | Admitting: Family Medicine

## 2018-05-28 DIAGNOSIS — E7800 Pure hypercholesterolemia, unspecified: Secondary | ICD-10-CM

## 2018-06-03 DIAGNOSIS — I2510 Atherosclerotic heart disease of native coronary artery without angina pectoris: Secondary | ICD-10-CM | POA: Insufficient documentation

## 2018-06-03 DIAGNOSIS — I700 Atherosclerosis of aorta: Secondary | ICD-10-CM | POA: Insufficient documentation

## 2018-06-03 NOTE — Progress Notes (Signed)
Chief Complaint  Patient presents with  . Hypertension    fasting (coffee with barely any milk and no sugar) med check. No concerns.     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 She was prescribed meloxicam and chiro was recommended.  She took the meloxicam for about a week, and symptoms resolved.  No further pain, didn't need to see the chiropractor. Only has some back discomfort with prolonged sitting or vacuuming.  Hypertension. She is compliant with taking losartan '50mg'$ . BP's have been running 120's/70's. She denies side effects. Denies headaches, chest pain, palpitations, edema, shortness of breath (other than from COPD)  Hyperlipidemia: She is compliant with taking simvastatin. Denies side effect. Tries to follow lowfat, low cholesterol diet She was noted to have aortic atherosclerosis on CT for lung cancer screening. Other cardiac risk factors include HTN, smoking, which is why statin was started. She declined the stress test previously recommended by cardiologist, Dr. Saunders Revel.  She has chronic DOE, which may also be related to her COPD. Calcified atherosclerotic plaque was noted in R coronary artery on CT scan (incidentally noted on lung CA screen) 12/2017. Lab Results  Component Value Date   CHOL 163 01/30/2018   HDL 54 01/30/2018   LDLCALC 85 01/30/2018   TRIG 121 01/30/2018   CHOLHDL 3.0 01/30/2018    Osteopenia--She previously took bisphosphonates (changed from fosamax (due to nausea) to Actonel, as well as Boniva, both of which she tolerated but stopped due to cost issues, possibly some GI side effects (she stopped without contacting us).  She takes a calcium +D supplement daily. She continues to smoke, and doesn't get any regular weight-bearing exercise.  DEXA was rechecked in 02/2018, which didn't show any progression/worsening. 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 We did not recommend restarting  medication, but encouraged Ca, D (has h/o deficiency), tobacco cessation and weight-bearing exercise, and to recheck in 2 years.  Vitamin D deficiency. Last level was 25 in 11/2017. She was advised to start 1000 IU of separate D3 in addition to her MVI.  She has been taking 1000 IU regularly.  Tobacco use--She continues to smoke 1.5PPD;husband also smokes. She has shortness of breath when going up and down the stairs to do her laundry, and in carrying her groceries in (up a hill to her house). She uses albuterol as needed, with good results. Otherwise, has limited exercise, and therefore no significant shortness of breath.  COPD: Spirometry in 11/2017 showed severe airway obstruction, with low vital capacity. She was prescribed Anoro, but she has stated it wasn't covered.  ?related to not having met deductible? She uses albuterol only prn shortness of breath, which is after exertion such as unloading groceries, taking trash cans back.   Had lung cancer screening CT in February 2019: IMPRESSION: 1. Lung-RADS 2S, benign appearance or behavior. Continue annual screening with low-dose chest CT without contrast in 12 months. 2. The "S" modifier above refers to potentially clinically significant non lung cancer related findings. Specifically, there is aortic atherosclerosis, in addition to right coronary artery disease. Please note that although the presence of coronary artery calcium documents the presence of coronary artery disease, the severity of this disease and any potential stenosis cannot be assessed on this non-gated CT examination. Assessment for potential risk factor modification, dietary therapy or pharmacologic therapy may be warranted, if clinically indicated. 3. Mild diffuse bronchial wall thickening with mild centrilobular and mild-to-moderate  paraseptal emphysema; imaging findings suggestive of underlying COPD.   H/o elevated TSH--TSH of 4.75 in 10/2016, with normal free T4;  recheck in June 2018 was stable (4.44). Last check 11/2017 was normal. She denies any changes in hair/skin/bowels/energy/temperatureintolerance. Lab Results  Component Value Date   TSH 2.74 12/02/2017   PMH, PSH, SH reviewed  Outpatient Encounter Medications as of 06/04/2018  Medication Sig Note  . albuterol (PROVENTIL HFA;VENTOLIN HFA) 108 (90 Base) MCG/ACT inhaler Inhale 2 puffs into the lungs every 6 (six) hours as needed for wheezing or shortness of breath.   Marland Kitchen aspirin EC 81 MG tablet Take 1 tablet (81 mg total) by mouth daily.   . Calcium-Vitamin D (CALTRATE 600 PLUS-VIT D PO) Take 1 each by mouth daily.   Marland Kitchen losartan (COZAAR) 50 MG tablet Take 1 tablet (50 mg total) by mouth daily.   . simvastatin (ZOCOR) 20 MG tablet TAKE 1 TABLET BY MOUTH EVERYDAY AT BEDTIME   . meloxicam (MOBIC) 15 MG tablet Take 1 tablet (15 mg total) by mouth daily. (Patient not taking: Reported on 06/04/2018)   . umeclidinium-vilanterol (ANORO ELLIPTA) 62.5-25 MCG/INH AEPB Inhale 1 puff into the lungs daily. (Patient not taking: Reported on 04/17/2018) 04/17/2018: Pt states insurance wouldn't cover   No facility-administered encounter medications on file as of 06/04/2018.    No Known Allergies  ROS: no fever, chills, headaches, dizziness, chest pain. Stable DOE.  No nausea, vomiting, bowel changes, abdominal pain, urinary complaints, URI symptoms, rash or other concerns. See HPI.  Back pain resolved. Moods are good.   PHYSICAL EXAM:  BP 128/74   Pulse 80   Ht 5' 5.5" (1.664 m)   Wt 142 lb 9.6 oz (64.7 kg)   BMI 23.37 kg/m   Wt Readings from Last 3 Encounters:  06/04/18 142 lb 9.6 oz (64.7 kg)  04/17/18 145 lb (65.8 kg)  02/24/18 145 lb (65.8 kg)    HEENT: conjunctiva and sclera are clear. EOMI, OP clear Neck: no lymphadenopathy, thyromegaly or mass. No carotid bruit Heart: regular rate and rhythm Lungs: somewhat distant. No wheezes, rales, ronchi Back: no spinal or CVA tenderness, no SI tenderness,  no muscle spasm Abdomen: soft, nontender, no organomegaly or mass Extremities: no edema, normal pulses Skin: normal turgor, somewhat dry Psych: normal mood, affect, hygiene and grooming Neuro: alert and oriented, cranial nerves intact, normal gait   ASSESSMENT/PLAN:  Essential hypertension - well controlled - Plan: Comprehensive metabolic panel  Chronic obstructive pulmonary disease, unspecified COPD type ([place]) - cannot afford recommended meds; encouraged use of albuterol prior to exertion rather than prn. Minimally symptomatic   Tobacco dependence - risks reviewed, strongly encouraged cessation  Pure hypercholesterolemia - cont statin.  - Plan: Comprehensive metabolic panel, Lipid panel  Vitamin D deficiency - cont daily D supplement - Plan: VITAMIN D 25 Hydroxy (Vit-D Deficiency, Fractures)  Osteopenia, unspecified location - add in upper body weight bearing exercise. Recheck DEXA 2 years. Cont Ca, D - Plan: VITAMIN D 25 Hydroxy (Vit-D Deficiency, Fractures)  Aortic atherosclerosis (HCC) - cont ASA, statin, BP meds  Atherosclerosis of right coronary artery - cont ASA, BP meds, statin   Vit D-OH, c-met, lipids Some cream about 5 hours ago in coffee  Calcified atherosclerotic plaque was noted in R coronary artery on CT scan (incidentally noted on lung CA screen) 12/2017. --cont statin --ensure taking ASA Consider stress test/follow-up with cardiology if sx Send note to Dr. Saunders Revel re: ?need for f/u or further eval  F/u as scheduled  in January

## 2018-06-04 ENCOUNTER — Encounter: Payer: Self-pay | Admitting: Family Medicine

## 2018-06-04 ENCOUNTER — Ambulatory Visit (INDEPENDENT_AMBULATORY_CARE_PROVIDER_SITE_OTHER): Payer: Medicare Other | Admitting: Family Medicine

## 2018-06-04 VITALS — BP 128/74 | HR 80 | Ht 65.5 in | Wt 142.6 lb

## 2018-06-04 DIAGNOSIS — I700 Atherosclerosis of aorta: Secondary | ICD-10-CM

## 2018-06-04 DIAGNOSIS — F17200 Nicotine dependence, unspecified, uncomplicated: Secondary | ICD-10-CM

## 2018-06-04 DIAGNOSIS — M8580 Other specified disorders of bone density and structure, unspecified site: Secondary | ICD-10-CM | POA: Diagnosis not present

## 2018-06-04 DIAGNOSIS — I2510 Atherosclerotic heart disease of native coronary artery without angina pectoris: Secondary | ICD-10-CM

## 2018-06-04 DIAGNOSIS — J449 Chronic obstructive pulmonary disease, unspecified: Secondary | ICD-10-CM | POA: Diagnosis not present

## 2018-06-04 DIAGNOSIS — E559 Vitamin D deficiency, unspecified: Secondary | ICD-10-CM

## 2018-06-04 DIAGNOSIS — I10 Essential (primary) hypertension: Secondary | ICD-10-CM | POA: Diagnosis not present

## 2018-06-04 DIAGNOSIS — E7800 Pure hypercholesterolemia, unspecified: Secondary | ICD-10-CM | POA: Diagnosis not present

## 2018-06-04 NOTE — Patient Instructions (Addendum)
Continue your current medications (including aspirin). We will be in touch with your lab test results soon. Try using your albuterol inhaler 15 minutes PRIOR to exertion that typically causes you to feel short of breath (trash cans, bringing in groceries, any other exercise) rather than waiting to have trouble breathing.  Please try and quit smoking--start thinking about why/when you smoke (habit, boredom, stress) in order to come up with effective strategies to cut back or quit. Available resources to help you quit include free counseling through [facility] Quitline (NCQuitline.com or 1-800-QUITNOW), smoking cessation classes through [facility] (call to find out schedule), over-the-counter nicotine replacements, and e-cigarettes (although this may not help break the hand-mouth habit).  Many insurance companies also have smoking cessation programs (which may decrease the cost of patches, meds if enrolled).  If these methods are not effective for you, and you are motivated to quit, return to discuss the possibility of prescription medications.

## 2018-06-05 LAB — LIPID PANEL
CHOLESTEROL TOTAL: 160 mg/dL (ref 100–199)
Chol/HDL Ratio: 2.9 ratio (ref 0.0–4.4)
HDL: 56 mg/dL (ref >39)
LDL CALC: 84 mg/dL (ref 0–99)
Triglycerides: 100 mg/dL (ref 0–149)
VLDL CHOLESTEROL CAL: 20 mg/dL (ref 5–40)

## 2018-06-05 LAB — COMPREHENSIVE METABOLIC PANEL
ALK PHOS: 107 IU/L (ref 39–117)
ALT: 11 IU/L (ref 0–32)
AST: 15 IU/L (ref 0–40)
Albumin/Globulin Ratio: 1.8 (ref 1.2–2.2)
Albumin: 4.6 g/dL (ref 3.5–4.8)
BUN/Creatinine Ratio: 14 (ref 12–28)
BUN: 11 mg/dL (ref 8–27)
Bilirubin Total: 0.5 mg/dL (ref 0.0–1.2)
CO2: 27 mmol/L (ref 20–29)
CREATININE: 0.77 mg/dL (ref 0.57–1.00)
Calcium: 9.2 mg/dL (ref 8.7–10.3)
Chloride: 97 mmol/L (ref 96–106)
GFR calc Af Amer: 90 mL/min/{1.73_m2} (ref >59)
GFR calc non Af Amer: 78 mL/min/{1.73_m2} (ref >59)
GLUCOSE: 96 mg/dL (ref 65–99)
Globulin, Total: 2.6 g/dL (ref 1.5–4.5)
Potassium: 4.7 mmol/L (ref 3.5–5.2)
Sodium: 140 mmol/L (ref 134–144)
Total Protein: 7.2 g/dL (ref 6.0–8.5)

## 2018-06-05 LAB — VITAMIN D 25 HYDROXY (VIT D DEFICIENCY, FRACTURES): Vit D, 25-Hydroxy: 28.2 ng/mL — ABNORMAL LOW (ref 30.0–100.0)

## 2018-08-08 ENCOUNTER — Telehealth: Payer: Self-pay

## 2018-08-08 NOTE — Telephone Encounter (Signed)
[facility] sent fax stating Losartan is on back order with no release date. please advise.

## 2018-08-08 NOTE — Telephone Encounter (Signed)
I'm not changing her med.  Need to check if available through another pharmacy

## 2018-08-11 ENCOUNTER — Other Ambulatory Visit: Payer: Self-pay | Admitting: *Deleted

## 2018-08-11 DIAGNOSIS — I10 Essential (primary) hypertension: Secondary | ICD-10-CM

## 2018-08-11 MED ORDER — LOSARTAN POTASSIUM 50 MG PO TABS: 50.0000 mg | ORAL_TABLET | Freq: Every day | ORAL | 0 refills | Status: DC

## 2018-08-11 NOTE — Telephone Encounter (Signed)
Sent to [facility] as they have in stock, patient aware.

## 2018-09-09 ENCOUNTER — Other Ambulatory Visit (INDEPENDENT_AMBULATORY_CARE_PROVIDER_SITE_OTHER): Payer: Medicare Other

## 2018-09-09 DIAGNOSIS — Z23 Encounter for immunization: Secondary | ICD-10-CM | POA: Diagnosis not present

## 2018-09-10 ENCOUNTER — Inpatient Hospital Stay (HOSPITAL_COMMUNITY)
Admission: EM | Admit: 2018-09-10 | Discharge: 2018-09-16 | DRG: 190 | Disposition: A | Discharge status: Home / Self Care | Payer: Medicare Other | Source: Ambulatory Visit | Attending: Internal Medicine | Admitting: Internal Medicine

## 2018-09-10 ENCOUNTER — Other Ambulatory Visit: Payer: Self-pay

## 2018-09-10 ENCOUNTER — Ambulatory Visit (HOSPITAL_COMMUNITY)
Admission: EM | Admit: 2018-09-10 | Discharge: 2018-09-10 | Disposition: A | Discharge status: Home / Self Care | Payer: Medicare Other | Source: Home / Self Care

## 2018-09-10 ENCOUNTER — Emergency Department (HOSPITAL_COMMUNITY): Payer: Medicare Other

## 2018-09-10 ENCOUNTER — Encounter (HOSPITAL_COMMUNITY): Payer: Self-pay

## 2018-09-10 DIAGNOSIS — Z87891 Personal history of nicotine dependence: Secondary | ICD-10-CM | POA: Diagnosis present

## 2018-09-10 DIAGNOSIS — E559 Vitamin D deficiency, unspecified: Secondary | ICD-10-CM | POA: Diagnosis not present

## 2018-09-10 DIAGNOSIS — I2510 Atherosclerotic heart disease of native coronary artery without angina pectoris: Secondary | ICD-10-CM | POA: Diagnosis present

## 2018-09-10 DIAGNOSIS — J9601 Acute respiratory failure with hypoxia: Secondary | ICD-10-CM | POA: Diagnosis not present

## 2018-09-10 DIAGNOSIS — I10 Essential (primary) hypertension: Secondary | ICD-10-CM | POA: Diagnosis not present

## 2018-09-10 DIAGNOSIS — F17200 Nicotine dependence, unspecified, uncomplicated: Secondary | ICD-10-CM | POA: Diagnosis present

## 2018-09-10 DIAGNOSIS — Z8249 Family history of ischemic heart disease and other diseases of the circulatory system: Secondary | ICD-10-CM

## 2018-09-10 DIAGNOSIS — Z7982 Long term (current) use of aspirin: Secondary | ICD-10-CM | POA: Diagnosis not present

## 2018-09-10 DIAGNOSIS — Z833 Family history of diabetes mellitus: Secondary | ICD-10-CM

## 2018-09-10 DIAGNOSIS — M8580 Other specified disorders of bone density and structure, unspecified site: Secondary | ICD-10-CM | POA: Diagnosis not present

## 2018-09-10 DIAGNOSIS — F17210 Nicotine dependence, cigarettes, uncomplicated: Secondary | ICD-10-CM | POA: Diagnosis not present

## 2018-09-10 DIAGNOSIS — Z823 Family history of stroke: Secondary | ICD-10-CM | POA: Diagnosis not present

## 2018-09-10 DIAGNOSIS — E785 Hyperlipidemia, unspecified: Secondary | ICD-10-CM | POA: Diagnosis not present

## 2018-09-10 DIAGNOSIS — R0602 Shortness of breath: Secondary | ICD-10-CM | POA: Diagnosis not present

## 2018-09-10 DIAGNOSIS — E7800 Pure hypercholesterolemia, unspecified: Secondary | ICD-10-CM | POA: Diagnosis not present

## 2018-09-10 DIAGNOSIS — J9621 Acute and chronic respiratory failure with hypoxia: Secondary | ICD-10-CM

## 2018-09-10 DIAGNOSIS — J441 Chronic obstructive pulmonary disease with (acute) exacerbation: Secondary | ICD-10-CM | POA: Diagnosis not present

## 2018-09-10 DIAGNOSIS — R05 Cough: Secondary | ICD-10-CM | POA: Diagnosis not present

## 2018-09-10 DIAGNOSIS — R0600 Dyspnea, unspecified: Secondary | ICD-10-CM

## 2018-09-10 LAB — CBC
HEMATOCRIT: 47.4 % — AB (ref 36.0–46.0)
HEMOGLOBIN: 15 g/dL (ref 12.0–15.0)
MCH: 30.6 pg (ref 26.0–34.0)
MCHC: 31.6 g/dL (ref 30.0–36.0)
MCV: 96.7 fL (ref 80.0–100.0)
Platelets: 184 10*3/uL (ref 150–400)
RBC: 4.9 MIL/uL (ref 3.87–5.11)
RDW: 12.3 % (ref 11.5–15.5)
WBC: 6.3 10*3/uL (ref 4.0–10.5)
nRBC: 0 % (ref 0.0–0.2)

## 2018-09-10 LAB — BASIC METABOLIC PANEL
Anion gap: 10 (ref 5–15)
BUN: 9 mg/dL (ref 8–23)
CHLORIDE: 99 mmol/L (ref 98–111)
CO2: 26 mmol/L (ref 22–32)
Calcium: 8.8 mg/dL — ABNORMAL LOW (ref 8.9–10.3)
Creatinine, Ser: 0.7 mg/dL (ref 0.44–1.00)
GFR calc Af Amer: >60 mL/min (ref >60)
GFR calc non Af Amer: >60 mL/min (ref >60)
GLUCOSE: 122 mg/dL — AB (ref 70–99)
POTASSIUM: 3.9 mmol/L (ref 3.5–5.1)
SODIUM: 135 mmol/L (ref 135–145)

## 2018-09-10 LAB — I-STAT ARTERIAL BLOOD GAS, ED
Acid-Base Excess: 4 mmol/L — ABNORMAL HIGH (ref 0.0–2.0)
Bicarbonate: 31.1 mmol/L — ABNORMAL HIGH (ref 20.0–28.0)
O2 Saturation: 93 %
Patient temperature: 98.7
TCO2: 33 mmol/L — ABNORMAL HIGH (ref 22–32)
pCO2 arterial: 54.3 mmHg — ABNORMAL HIGH (ref 32.0–48.0)
pH, Arterial: 7.366 (ref 7.350–7.450)
pO2, Arterial: 71 mmHg — ABNORMAL LOW (ref 83.0–108.0)

## 2018-09-10 LAB — I-STAT TROPONIN, ED: Troponin i, poc: 0.03 ng/mL (ref 0.00–0.08)

## 2018-09-10 MED ORDER — ALBUTEROL SULFATE (2.5 MG/3ML) 0.083% IN NEBU
5.0000 mg | INHALATION_SOLUTION | Freq: Once | RESPIRATORY_TRACT | Status: AC
  Administered 2018-09-10: 5 mg via RESPIRATORY_TRACT
  Filled 2018-09-10: qty 6

## 2018-09-10 MED ORDER — UMECLIDINIUM-VILANTEROL 62.5-25 MCG/INH IN AEPB: 1.0000 | INHALATION_SPRAY | Freq: Every day | RESPIRATORY_TRACT | Status: DC

## 2018-09-10 MED ORDER — ALBUTEROL SULFATE HFA 108 (90 BASE) MCG/ACT IN AERS: 2.0000 | INHALATION_SPRAY | Freq: Four times a day (QID) | RESPIRATORY_TRACT | Status: DC | PRN

## 2018-09-10 MED ORDER — LOSARTAN POTASSIUM 50 MG PO TABS
50.0000 mg | ORAL_TABLET | Freq: Every day | ORAL | Status: DC
  Administered 2018-09-10 – 2018-09-16 (×7): 50 mg via ORAL
  Filled 2018-09-10 (×8): qty 1

## 2018-09-10 MED ORDER — LEVOFLOXACIN IN D5W 500 MG/100ML IV SOLN: 500.0000 mg | INTRAVENOUS | Status: DC

## 2018-09-10 MED ORDER — PREDNISONE 20 MG PO TABS
40.0000 mg | ORAL_TABLET | Freq: Every day | ORAL | Status: DC
  Administered 2018-09-11 – 2018-09-12 (×2): 40 mg via ORAL
  Filled 2018-09-10 (×2): qty 2

## 2018-09-10 MED ORDER — SODIUM CHLORIDE 0.9% FLUSH: 3.0000 mL | INTRAVENOUS | Status: DC | PRN

## 2018-09-10 MED ORDER — IPRATROPIUM-ALBUTEROL 0.5-2.5 (3) MG/3ML IN SOLN
3.0000 mL | Freq: Four times a day (QID) | RESPIRATORY_TRACT | Status: DC
  Administered 2018-09-10: 3 mL via RESPIRATORY_TRACT
  Filled 2018-09-10: qty 3

## 2018-09-10 MED ORDER — SIMVASTATIN 20 MG PO TABS
20.0000 mg | ORAL_TABLET | Freq: Every day | ORAL | Status: DC
  Administered 2018-09-10 – 2018-09-15 (×6): 20 mg via ORAL
  Filled 2018-09-10 (×7): qty 1

## 2018-09-10 MED ORDER — ENOXAPARIN SODIUM 40 MG/0.4ML ~~LOC~~ SOLN
40.0000 mg | SUBCUTANEOUS | Status: DC
  Administered 2018-09-10 – 2018-09-15 (×6): 40 mg via SUBCUTANEOUS
  Filled 2018-09-10 (×6): qty 0.4

## 2018-09-10 MED ORDER — LEVOFLOXACIN 750 MG PO TABS: 750.0000 mg | ORAL_TABLET | Freq: Every day | ORAL | Status: DC

## 2018-09-10 MED ORDER — NICOTINE 14 MG/24HR TD PT24
14.0000 mg | MEDICATED_PATCH | Freq: Every day | TRANSDERMAL | Status: DC
  Administered 2018-09-11: 14 mg via TRANSDERMAL
  Filled 2018-09-10: qty 1

## 2018-09-10 MED ORDER — IPRATROPIUM-ALBUTEROL 0.5-2.5 (3) MG/3ML IN SOLN
3.0000 mL | Freq: Three times a day (TID) | RESPIRATORY_TRACT | Status: DC
  Administered 2018-09-11 – 2018-09-12 (×4): 3 mL via RESPIRATORY_TRACT
  Filled 2018-09-10 (×4): qty 3

## 2018-09-10 MED ORDER — IPRATROPIUM BROMIDE 0.02 % IN SOLN
0.5000 mg | Freq: Once | RESPIRATORY_TRACT | Status: AC
  Administered 2018-09-10: 0.5 mg via RESPIRATORY_TRACT
  Filled 2018-09-10: qty 2.5

## 2018-09-10 MED ORDER — ALBUTEROL SULFATE (2.5 MG/3ML) 0.083% IN NEBU: 2.5000 mg | INHALATION_SOLUTION | RESPIRATORY_TRACT | Status: DC | PRN

## 2018-09-10 MED ORDER — DOXYCYCLINE HYCLATE 100 MG IV SOLR
200.0000 mg | INTRAVENOUS | Status: DC
  Administered 2018-09-10: 200 mg via INTRAVENOUS
  Filled 2018-09-10 (×2): qty 200

## 2018-09-10 MED ORDER — MELOXICAM 7.5 MG PO TABS: 15.0000 mg | ORAL_TABLET | Freq: Every day | ORAL | Status: DC

## 2018-09-10 MED ORDER — ASPIRIN EC 81 MG PO TBEC
81.0000 mg | DELAYED_RELEASE_TABLET | Freq: Every day | ORAL | Status: DC
  Administered 2018-09-10 – 2018-09-16 (×7): 81 mg via ORAL
  Filled 2018-09-10 (×8): qty 1

## 2018-09-10 MED ORDER — PREDNISONE 20 MG PO TABS
60.0000 mg | ORAL_TABLET | Freq: Once | ORAL | Status: AC
  Administered 2018-09-10: 60 mg via ORAL
  Filled 2018-09-10: qty 3

## 2018-09-10 MED ORDER — SODIUM CHLORIDE 0.9% FLUSH
3.0000 mL | Freq: Two times a day (BID) | INTRAVENOUS | Status: DC
  Administered 2018-09-10 – 2018-09-16 (×10): 3 mL via INTRAVENOUS

## 2018-09-10 MED ORDER — SODIUM CHLORIDE 0.9 % IV SOLN: 250.0000 mL | INTRAVENOUS | Status: DC | PRN

## 2018-09-10 NOTE — ED Triage Notes (Addendum)
Pt endorses cough and shob and "my chest has been on fire" since Saturday. Has been using inhaler with some relief. Breathing has been labored more at night. Sent by [facility] and placed on 2L [place] for spo2 in lower 80s

## 2018-09-10 NOTE — Progress Notes (Signed)
Attempted to medicate patient and offer her sandwich / something to eat while she is waiting for food.She stated she will wait on dinner try. Meds. moved to 1930.

## 2018-09-10 NOTE — ED Triage Notes (Signed)
Pt c/o cough and sob sicne Saturday. o2 sats are 80%. Pt is NOt on home o2. Hx of copd.

## 2018-09-10 NOTE — ED Notes (Signed)
Admitting, MD at bedside.  

## 2018-09-10 NOTE — H&P (Addendum)
History and Physical    Tiffany [name] BMW:[id number] DOB: Dec 18, 1945 DOA: 09/10/2018  PCP: Rita Ohara, MD Consultants:  none Patient coming from: home- lives with husband  Chief Complaint: Shortness of breath  HPI: Tiffany [name] is a 72 y.o. female with medical history significant for COPD not on home O2, HTN, hyperlipidemia, tobacco use, osteopenia, MGUS, who presented to urgent care today complaining of 5 days of progressive shortness of breath. Her symptoms started out like a cold, with a sore throat, and progressed to dyspnea and wheezing which prompted her urgent care visit today. Her cough is productive of green sputum. She has never been on home O2 in the past. She has not been hospitalized in around a decade. She denies fevers, chills. No sick contacts or recent travel. Had emesis this morning after orange juice but has not had any nausea per se. No diarrhea.    ED Course: She was 80% on RA, started on 2L [place], given albuterol nebs x 2, atrovent neb, and 60 mg prednisone. She was taken off RA but O2 sats quickly dropped again to 86% so she was put back on 2L. Remainder of VS were WNL. No significant lab abnormalities, CXR neg for acute.  Review of Systems: As per HPI; otherwise review of systems reviewed and negative.   Ambulatory Status:  Ambulates without assistance  Past Medical History:  Diagnosis Date  . Abnormal PFT 09/02/14   moderate restrictive ventilatory defect.  Marland Kitchen BCC (basal cell carcinoma), face 2010   Dr. Allyson Sabal  . Colon polyp 2/05  . Diverticulosis 11/2010   seen on colonoscopy and air contrast BE  . Hyperlipidemia   . Hypertension    previously treated with meds, resolved  . MGUS (monoclonal gammopathy of unknown significance) 1999   prevously under care of Dr. Learta Codding  . Osteopenia    DEXA 01/31/2010 at [facility]; 2014 at [facility]  . 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 Tobacco abuse     Past Surgical History:  Procedure  Laterality Date  . BASAL CELL CARCINOMA EXCISION  2010   L face, R temple  . BILATERAL SALPINGOOPHORECTOMY  2007   cancerous tumor R ovary  . CATARACT EXTRACTION  2009   bilateral  . COLONOSCOPY  11/2010   Dr. Wynetta Emery. Diverticulosis. BE also done--normal  . OMENTECTOMY  2007   and pelvic lymphadenectomy (with BSO)  . TONSILLECTOMY  age 72  . VAGINAL HYSTERECTOMY  late 20's    Social History   Socioeconomic History  . Marital status: Married    Spouse name: Not on file  . Number of children: 1  . Years of education: Not on file  . Highest education level: Not on file  Occupational History  . Occupation: retired Manufacturing systems engineer)    Employer: RETIRED  Social Needs  . Financial resource strain: Not on file  . Food insecurity:    Worry: Not on file    Inability: Not on file  . Transportation needs:    Medical: Not on file    Non-medical: Not on file  Tobacco Use  . Smoking status: Current Every Day Smoker    Packs/day: 1.50    Years: 50.00    Pack years: 75.00    Types: Cigarettes  . Smokeless tobacco: Never Used  Substance and Sexual Activity  . Alcohol use: Yes    Alcohol/week: 0.0 standard drinks    Comment: maybe 1-2 times per year. (rare  beer in the summer)  . Drug use: No  . Sexual activity: Yes    Partners: Male  Lifestyle  . Physical activity:    Days per week: Not on file    Minutes per session: Not on file  . Stress: Not on file  Relationships  . Social connections:    Talks on phone: Not on file    Gets together: Not on file    Attends religious service: Not on file    Active member of club or organization: Not on file    Attends meetings of clubs or organizations: Not on file    Relationship status: Not on file  . Intimate partner violence:    Fear of current or ex partner: Not on file    Emotionally abused: Not on file    Physically abused: Not on file    Forced sexual activity: Not on file  Other Topics Concern  . Not on file  Social  History Narrative   Lives with husband, 1 cat.  Son lives in [place]. 1 grandson     No Known Allergies  Family History  Problem Relation Age of Onset  . Stroke Mother   . Cerebral aneurysm Mother   . Hypertension Father   . Suicidality Father   . Diabetes Maternal Aunt   . Diabetes Maternal Aunt   . Diabetes Maternal Aunt   . Throat cancer Maternal Uncle   . Breast cancer Maternal Aunt        2 aunts  . Lung cancer Maternal Uncle     Prior to Admission medications   Medication Sig Start Date End Date Taking? Authorizing Provider  albuterol (PROVENTIL HFA;VENTOLIN HFA) 108 (90 Base) MCG/ACT inhaler Inhale 2 puffs into the lungs every 6 (six) hours as needed for wheezing or shortness of breath. 01/08/18 07/27/22  Rita Ohara, MD  aspirin EC 81 MG tablet Take 1 tablet (81 mg total) by mouth daily. 12/31/16   End, Harrell Gave, MD  Calcium-Vitamin D (CALTRATE 600 PLUS-VIT D PO) Take 1 each by mouth daily.    [provider]  losartan (COZAAR) 50 MG tablet Take 1 tablet (50 mg total) by mouth daily. 08/11/18   Rita Ohara, MD  meloxicam (MOBIC) 15 MG tablet Take 1 tablet (15 mg total) by mouth daily. Patient not taking: Reported on 06/04/2018 04/17/18   Rita Ohara, MD  simvastatin (ZOCOR) 20 MG tablet TAKE 1 TABLET BY MOUTH EVERYDAY AT BEDTIME 05/28/18   Rita Ohara, MD  umeclidinium-vilanterol [facility] ELLIPTA) 62.5-25 MCG/INH AEPB Inhale 1 puff into the lungs daily. Patient not taking: Reported on 04/17/2018 02/24/18   Rita Ohara, MD    Physical Exam: Vitals:   09/10/18 1430 09/10/18 1500 09/10/18 1519 09/10/18 1545  BP: 124/69  132/70 (!) 134/53  Pulse: 79 91 79 77  Resp: 18 20 16 18   Temp:      TempSrc:      SpO2:  98% 91% 99%     . General:  Appears calm and comfortable and is in NAD.  Marland Kitchen Eyes:  PERRL, EOMI, normal lids, iris . ENT:  grossly normal hearing, lips & tongue, mmm; appropriate dentition . Neck:  no LAD, masses or thyromegaly; no carotid bruits . Cardiovascular:   normal rate, reg rhythm, no murmur. Marland Kitchen Respiratory: Mildly increased WOB with significant exp wheeze in all fields; no rales/rhonchi.  . Abdomen:  soft, NT, ND, NABS . Back:   grossly normal alignment, no CVAT . Skin:  no rash  or induration seen on limited exam . Musculoskeletal:  grossly normal tone BUE/BLE, good ROM, no bony abnormality or obvious joint deformity . Lower extremity:  No LE edema.  Limited foot exam with no ulcerations.  2+ distal pulses. Marland Kitchen Psychiatric:  grossly normal mood and affect, speech fluent and appropriate, AOx3 . Neurologic:  CN 2-12 grossly intact, moves all extremities in coordinated fashion, sensation intact    Radiological Exams on Admission: Dg Chest 2 View  Result Date: 09/10/2018 CLINICAL DATA:  Cough, shortness of breath. EXAM: CHEST - 2 VIEW COMPARISON:  Radiographs of August 30, 2014. FINDINGS: The heart size and mediastinal contours are within normal limits. No pneumothorax or pleural effusion is noted. Stable biapical scarring is noted. No acute pulmonary disease is noted. The visualized skeletal structures are unremarkable. IMPRESSION: No active cardiopulmonary disease. Electronically Signed   By: Marijo Conception, M.D.   On: 09/10/2018 13:27    EKG: Independently reviewed.  NSR with rate 80 Biatrial enlargement Anteroseptal infarct , age undetermined Abnormal ECG   Labs on Admission: I have personally reviewed the available labs and imaging studies at the time of the admission.  Pertinent labs:  Creat 0.7 TnI 0.03 WBC 6.3 Hgb 15 Plt 184 ABG 7.366/54.3/71/31     Assessment/Plan Principal Problem:   Acute on chronic respiratory failure with hypoxia (HCC) Active Problems:   Tobacco use disorder   Vitamin D deficiency   Pure hypercholesterolemia   Osteopenia   Essential hypertension   Atherosclerosis of right coronary artery   Dyspnea   COPD with acute exacerbation (HCC)   Acute hypoxic resp failure 2/2 COPD exacerbation.  Moderate/severe exacerbation as evidenced by increased dyspnea, hypoxia and increased sputum -admit to tele -scheduled duonebs q6h, prn SABA -prednisone 60 mg given today; continue with 40 mg tomorrow for planned burst -supplemental O2 prn; ambulatory O2 sats tomorrow -check influenza PCR -start doxy 200 mg IV for 5 days -RT eval and treat -COPD pathway  CAD -cont baby ASA, cozaar, zocor  Essential HTN -cont cozaar  Tobacco Use disorder -pt has never tried any nicotine replacement or medications to quit smoking; she is interested in quitting and would like to discuss further what her options are for pharmacologic assistance -counseled for 5 min  Vit D deficiency, Osteopenia -cont vit D, Ca    DVT prophylaxis:  Lovenox Code Status:  Full - confirmed with patient/family Family Communication: husband at bedside  Disposition Plan:  Home once clinically improved Consults called: none  Admission status: Admit - It is my clinical opinion that admission to INPATIENT is reasonable and necessary because of the expectation that this patient will require hospital care that crosses at least 2 midnights to treat this condition based on the medical complexity of the problems presented.  Given the aforementioned information, the predictability of an adverse outcome is felt to be significant.    Janora Norlander MD [facility]  If note is complete, please contact covering daytime or nighttime physician. [email or web address] Password [email or web address]  09/10/2018, 4:18 PM

## 2018-09-10 NOTE — ED Notes (Signed)
Spoke with dr. Mannie Stabile, pt placed on 4L [place], pt husband would like to drive pt down tot he ER. Will place pt on 02 for 5-10 min to get 02 sat up and then husband whill drive pt to er.

## 2018-09-10 NOTE — ED Provider Notes (Signed)
[place] EMERGENCY DEPARTMENT Provider Note   CSN: [id number] Arrival date & time: 09/10/18  1234     History   Chief Complaint Chief Complaint  Patient presents with  . Cough  . Shortness of Breath    HPI Tiffany [name] is a 72 y.o. female.  HPI  73 year old female with a history of COPD presents with shortness of breath.  Started having cough and cold-like symptoms about 4 or 5 days ago.  Has had progressively worsening shortness of breath since.  Cough is occasionally productive with green sputum.  Is also having nasal congestion.  She had some sore throat and chest burning originally but that is all gone and no current chest pain or other symptoms.  Has been using her albuterol inhaler with partial relief.  Went to urgent care and was sent here due to her O2 sat being 80% on room air.  She does not normally wear oxygen.  Vomited once this morning after drinking orange juice but otherwise no vomiting.  Past Medical History:  Diagnosis Date  . Abnormal PFT 09/02/14   moderate restrictive ventilatory defect.  Marland Kitchen BCC (basal cell carcinoma), face 2010   Dr. Allyson Sabal  . Colon polyp 2/05  . Diverticulosis 11/2010   seen on colonoscopy and air contrast BE  . Hyperlipidemia   . Hypertension    previously treated with meds, resolved  . MGUS (monoclonal gammopathy of unknown significance) 1999   prevously under care of Dr. Learta Codding  . Osteopenia    DEXA 01/31/2010 at [facility]; 2014 at [facility]  . 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 Tobacco abuse     Patient Active Problem List   Diagnosis Date Noted  . Dyspnea 09/10/2018  . Acute on chronic respiratory failure with hypoxia ([place]) 09/10/2018  . COPD with acute exacerbation ([place]) 09/10/2018  . Aortic atherosclerosis ([place]) 06/03/2018  . Atherosclerosis of right coronary artery 06/03/2018  . Atypical chest pain 12/31/2016  . Essential hypertension 12/31/2016  . MGUS (monoclonal  gammopathy of unknown significance) 10/30/2016  . Senile purpura ([place]) 08/31/2015  . COPD (chronic obstructive pulmonary disease) ([place]) 02/24/2015  . Osteopenia 05/12/2012  . Vitamin D deficiency 04/28/2012  . Pure hypercholesterolemia 04/28/2012  . Tobacco use disorder 04/03/2012    Past Surgical History:  Procedure Laterality Date  . BASAL CELL CARCINOMA EXCISION  2010   L face, R temple  . BILATERAL SALPINGOOPHORECTOMY  2007   cancerous tumor R ovary  . CATARACT EXTRACTION  2009   bilateral  . COLONOSCOPY  11/2010   Dr. Wynetta Emery. Diverticulosis. BE also done--normal  . OMENTECTOMY  2007   and pelvic lymphadenectomy (with BSO)  . TONSILLECTOMY  age 51  . VAGINAL HYSTERECTOMY  late 20's     OB History    Gravida  1   Para  1   Term      Preterm      AB      Living  1     SAB      TAB      Ectopic      Multiple      Live Births               Home Medications    Prior to Admission medications   Medication Sig Start Date End Date Taking? Authorizing Provider  albuterol (PROVENTIL HFA;VENTOLIN HFA) 108 (90 Base) MCG/ACT inhaler Inhale 2 puffs into the lungs every 6 (  six) hours as needed for wheezing or shortness of breath. 01/08/18 07/27/22 Yes Tiffany Ohara, MD  aspirin EC 81 MG tablet Take 1 tablet (81 mg total) by mouth daily. 12/31/16  Yes End, Harrell Gave, MD  losartan (COZAAR) 50 MG tablet Take 1 tablet (50 mg total) by mouth daily. 08/11/18  Yes Tiffany Ohara, MD  simvastatin (ZOCOR) 20 MG tablet TAKE 1 TABLET BY MOUTH EVERYDAY AT BEDTIME 05/28/18  Yes Tiffany Ohara, MD  albuterol (PROVENTIL HFA;VENTOLIN HFA) 108 (90 Base) MCG/ACT inhaler Inhale 1 puff into the lungs every 4 (four) hours as needed.    [provider]  Calcium-Vitamin D (CALTRATE 600 PLUS-VIT D PO) Take 1 each by mouth daily.    [provider]  meloxicam (MOBIC) 15 MG tablet Take 1 tablet (15 mg total) by mouth daily. Patient not taking: Reported on 06/04/2018 04/17/18   Tiffany Ohara,  MD  umeclidinium-vilanterol [facility] ELLIPTA) 62.5-25 MCG/INH AEPB Inhale 1 puff into the lungs daily. Patient not taking: Reported on 04/17/2018 02/24/18   Tiffany Ohara, MD    Family History Family History  Problem Relation Age of Onset  . Stroke Mother   . Cerebral aneurysm Mother   . Hypertension Father   . Suicidality Father   . Diabetes Maternal Aunt   . Diabetes Maternal Aunt   . Diabetes Maternal Aunt   . Throat cancer Maternal Uncle   . Breast cancer Maternal Aunt        2 aunts  . Lung cancer Maternal Uncle     Social History Social History   Tobacco Use  . Smoking status: Current Every Day Smoker    Packs/day: 1.50    Years: 50.00    Pack years: 75.00    Types: Cigarettes  . Smokeless tobacco: Never Used  Substance Use Topics  . Alcohol use: Yes    Alcohol/week: 0.0 standard drinks    Comment: maybe 1-2 times per year. (rare beer in the summer)  . Drug use: No     Allergies   Patient has no known allergies.   Review of Systems Review of Systems  Constitutional: Negative for fever.  HENT: Positive for congestion and sore throat.   Respiratory: Positive for cough and shortness of breath.   Cardiovascular: Negative for chest pain.  Gastrointestinal: Positive for vomiting. Negative for abdominal pain.  All other systems reviewed and are negative.    Physical Exam Updated Vital Signs BP (!) 128/57   Pulse 80   Temp 98.5 F (36.9 C) (Oral)   Resp 18   Ht 5\' 7"  (1.702 m)   Wt 63.8 kg   SpO2 95%   BMI 22.04 kg/m   Physical Exam  Constitutional: She appears well-developed and well-nourished.  Non-toxic appearance. She does not appear ill.  HENT:  Head: Normocephalic and atraumatic.  Right Ear: External ear normal.  Left Ear: External ear normal.  Nose: Nose normal.  Eyes: Right eye exhibits no discharge. Left eye exhibits no discharge.  Cardiovascular: Normal rate, regular rhythm and normal heart sounds.  Pulmonary/Chest: No accessory muscle  usage. Tachypnea noted. No respiratory distress. She has decreased breath sounds. She has wheezes.  Speaks in complete sentences  Abdominal: Soft. There is no tenderness.  Neurological: She is alert.  Skin: Skin is warm and dry.  Psychiatric: Her mood appears not anxious.  Nursing note and vitals reviewed.    ED Treatments / Results  Labs (all labs ordered are listed, but only abnormal results are displayed) Labs Reviewed  BASIC METABOLIC PANEL - Abnormal; Notable for the following components:      Result Value   Glucose, Bld 122 (*)    Calcium 8.8 (*)    All other components within normal limits  CBC - Abnormal; Notable for the following components:   HCT 47.4 (*)    All other components within normal limits  I-STAT ARTERIAL BLOOD GAS, ED - Abnormal; Notable for the following components:   pCO2 arterial 54.3 (*)    pO2, Arterial 71.0 (*)    Bicarbonate 31.1 (*)    TCO2 33 (*)    Acid-Base Excess 4.0 (*)    All other components within normal limits  HIV ANTIBODY (ROUTINE TESTING W REFLEX)  BASIC METABOLIC PANEL  CBC  INFLUENZA PANEL BY PCR (TYPE A & B)  I-STAT TROPONIN, ED    EKG EKG Interpretation  Date/Time:  Wednesday September 10 2018 12:39:52 EDT Ventricular Rate:  80 PR Interval:  144 QRS Duration: 80 QT Interval:  338 QTC Calculation: 389 R Axis:   54 Text Interpretation:  Normal sinus rhythm Biatrial enlargement Anteroseptal infarct , age undetermined Abnormal ECG Confirmed by Sherwood Gambler [phone]) on 09/10/2018 1:23:38 PM   Radiology Dg Chest 2 View  Result Date: 09/10/2018 CLINICAL DATA:  Cough, shortness of breath. EXAM: CHEST - 2 VIEW COMPARISON:  Radiographs of August 30, 2014. FINDINGS: The heart size and mediastinal contours are within normal limits. No pneumothorax or pleural effusion is noted. Stable biapical scarring is noted. No acute pulmonary disease is noted. The visualized skeletal structures are unremarkable. IMPRESSION: No active  cardiopulmonary disease. Electronically Signed   By: Marijo Conception, M.D.   On: 09/10/2018 13:27    Procedures Procedures (including critical care time)  Medications Ordered in ED Medications  aspirin EC tablet 81 mg (has no administration in time range)  losartan (COZAAR) tablet 50 mg (has no administration in time range)  simvastatin (ZOCOR) tablet 20 mg (has no administration in time range)  enoxaparin (LOVENOX) injection 40 mg (40 mg Subcutaneous Given 09/10/18 1826)  sodium chloride flush (NS) 0.9 % injection 3 mL (has no administration in time range)  sodium chloride flush (NS) 0.9 % injection 3 mL (has no administration in time range)  0.9 %  sodium chloride infusion (has no administration in time range)  nicotine (NICODERM CQ - dosed in mg/24 hours) patch 14 mg (14 mg Transdermal Not Given 09/10/18 1752)  ipratropium-albuterol (DUONEB) 0.5-2.5 (3) MG/3ML nebulizer solution 3 mL (3 mLs Nebulization Given 09/10/18 2101)  predniSONE (DELTASONE) tablet 40 mg (has no administration in time range)  albuterol (PROVENTIL) (2.5 MG/3ML) 0.083% nebulizer solution 2.5 mg (has no administration in time range)  levofloxacin (LEVAQUIN) IVPB 500 mg (has no administration in time range)  albuterol (PROVENTIL) (2.5 MG/3ML) 0.083% nebulizer solution 5 mg (5 mg Nebulization Given 09/10/18 1354)  ipratropium (ATROVENT) nebulizer solution 0.5 mg (0.5 mg Nebulization Given 09/10/18 1354)  predniSONE (DELTASONE) tablet 60 mg (60 mg Oral Given 09/10/18 1353)  albuterol (PROVENTIL) (2.5 MG/3ML) 0.083% nebulizer solution 5 mg (5 mg Nebulization Given 09/10/18 1519)     Initial Impression / Assessment and Plan / ED Course  I have reviewed the triage vital signs and the nursing notes.  Pertinent labs & imaging results that were available during my care of the patient were reviewed by me and considered in my medical decision making (see chart for details).     Patient symptomatically is feeling better  after multiple breathing treatments and prednisone  but she is still hypoxic on room air down to 83%.  Thus will need admission and supportive care.  My suspicion for PE is low in this scenario given the infectious signs and symptoms and wheezing.  Hospitalist to admit.  Final Clinical Impressions(s) / ED Diagnoses   Final diagnoses:  Acute respiratory failure with hypoxia [facility])  COPD exacerbation [facility])    ED Discharge Orders    None       Sherwood Gambler, MD 09/10/18 2139

## 2018-09-10 NOTE — Progress Notes (Signed)
Attempted to medicate pt, she requested to hold pills until she has food to eat. Dinner has been ordered, is on its way.   lovenox injection given.

## 2018-09-10 NOTE — ED Notes (Signed)
ED Provider at bedside. 

## 2018-09-10 NOTE — ED Notes (Signed)
Pt 86% on room air. EDP notified/at bedside. Pt placed back on [place].

## 2018-09-10 NOTE — ED Triage Notes (Signed)
Charge nurse in ER notified of pt condition.

## 2018-09-10 NOTE — ED Notes (Signed)
Pt transported to XR, will bring Pt when finished.

## 2018-09-10 NOTE — ED Notes (Signed)
RN attempted report 

## 2018-09-11 DIAGNOSIS — J9621 Acute and chronic respiratory failure with hypoxia: Secondary | ICD-10-CM

## 2018-09-11 LAB — BASIC METABOLIC PANEL
Anion gap: 9 (ref 5–15)
BUN: 7 mg/dL — ABNORMAL LOW (ref 8–23)
CO2: 31 mmol/L (ref 22–32)
Calcium: 8.5 mg/dL — ABNORMAL LOW (ref 8.9–10.3)
Chloride: 96 mmol/L — ABNORMAL LOW (ref 98–111)
Creatinine, Ser: 0.59 mg/dL (ref 0.44–1.00)
GFR calc Af Amer: >60 mL/min (ref >60)
GFR calc non Af Amer: >60 mL/min (ref >60)
Glucose, Bld: 95 mg/dL (ref 70–99)
Potassium: 4.2 mmol/L (ref 3.5–5.1)
Sodium: 136 mmol/L (ref 135–145)

## 2018-09-11 LAB — CBC
HCT: 43.4 % (ref 36.0–46.0)
Hemoglobin: 13.5 g/dL (ref 12.0–15.0)
MCH: 30.1 pg (ref 26.0–34.0)
MCHC: 31.1 g/dL (ref 30.0–36.0)
MCV: 96.7 fL (ref 80.0–100.0)
Platelets: 175 10*3/uL (ref 150–400)
RBC: 4.49 MIL/uL (ref 3.87–5.11)
RDW: 12.3 % (ref 11.5–15.5)
WBC: 5.7 10*3/uL (ref 4.0–10.5)
nRBC: 0 % (ref 0.0–0.2)

## 2018-09-11 LAB — INFLUENZA PANEL BY PCR (TYPE A & B)
Influenza A By PCR: NEGATIVE
Influenza B By PCR: NEGATIVE

## 2018-09-11 LAB — HIV ANTIBODY (ROUTINE TESTING W REFLEX): HIV Screen 4th Generation wRfx: NONREACTIVE

## 2018-09-11 MED ORDER — NICOTINE 21 MG/24HR TD PT24
21.0000 mg | MEDICATED_PATCH | Freq: Every day | TRANSDERMAL | Status: DC
  Administered 2018-09-12 – 2018-09-15 (×4): 21 mg via TRANSDERMAL
  Filled 2018-09-11 (×4): qty 1

## 2018-09-11 MED ORDER — GUAIFENESIN ER 600 MG PO TB12
600.0000 mg | ORAL_TABLET | Freq: Two times a day (BID) | ORAL | Status: DC
  Administered 2018-09-11 – 2018-09-16 (×10): 600 mg via ORAL
  Filled 2018-09-11 (×11): qty 1

## 2018-09-11 MED ORDER — SALINE SPRAY 0.65 % NA SOLN
1.0000 | NASAL | Status: DC | PRN
  Administered 2018-09-11: 1 via NASAL
  Filled 2018-09-11: qty 44

## 2018-09-11 MED ORDER — FLUTICASONE PROPIONATE 50 MCG/ACT NA SUSP
2.0000 | Freq: Every day | NASAL | Status: DC
  Administered 2018-09-12 – 2018-09-15 (×4): 2 via NASAL
  Filled 2018-09-11: qty 16

## 2018-09-11 MED ORDER — DOXYCYCLINE HYCLATE 100 MG PO TABS
100.0000 mg | ORAL_TABLET | Freq: Two times a day (BID) | ORAL | Status: DC
  Administered 2018-09-11 – 2018-09-16 (×10): 100 mg via ORAL
  Filled 2018-09-11 (×10): qty 1

## 2018-09-11 NOTE — Progress Notes (Signed)
Nutrition Brief Note  RD consulted for assessment of nutritional status/ needs per COPD GOLD protocol.   Wt Readings from Last 15 Encounters:  09/11/18 63.7 kg  06/04/18 64.7 kg  04/17/18 65.8 kg  02/24/18 65.8 kg  02/10/18 66.7 kg  02/04/18 66.2 kg  12/02/17 67.6 kg  05/06/17 66.7 kg  12/31/16 69.4 kg  11/01/16 69.8 kg  03/01/16 71.4 kg  08/31/15 73.1 kg  02/24/15 71.8 kg  08/25/14 71.7 kg  12/31/13 71.7 kg   Tiffany [name] is a 72 y.o. female with medical history significant for COPD not on home O2, HTN, hyperlipidemia, tobacco use, osteopenia, MGUS, who presented to urgent care today complaining of 5 days of progressive shortness of breath.  Pt admitted with acute hypoxic respiratory failure secondary to COPD exacerbation.   Pt resting quietly in bed at time of visit.   Pt has experienced a 3.2% wt loss over the past 6 months, which is not significant for time frame.   Medications reviewed and include prednisone.   Nutrition-Focused physical exam completed. Findings are no fat depletion, no muscle depletion, and no edema.   Labs reviewed.   Body mass index is 22.01 kg/m. Patient meets criteria for normal weight range based on current BMI.   Current diet order is Heart Healthy, patient is consuming approximately 70% of meals at this time. Labs and medications reviewed.   No nutrition interventions warranted at this time. If nutrition issues arise, please consult RD.    A. Jimmye Norman, RD, LDN, CDE Pager: [phone] After hours Pager: [phone]

## 2018-09-11 NOTE — Evaluation (Signed)
Occupational Therapy Evaluation and Discharge Patient Details Name: Ayssa Bentivegna Ottaviano MRN: [id number] DOB: 1946-08-29 Today's Date: 09/11/2018    History of Present Illness  Jadaya Sommerfield Dom is a 72 y.o. female with medical history significant for COPD not on home O2, HTN, hyperlipidemia, tobacco use, osteopenia, MGUS, who presented to urgent care today complaining of 5 days of progressive shortness of breath. Found to have Acute hypoxic resp failure 2/2 COPD exacerbation.   Clinical Impression   This 72 yo female admitted with above presents to acute OT with all education completed and energy conservation handout given. We will D/C from acute OT.    Follow Up Recommendations  No OT follow up;Supervision - Intermittent    Equipment Recommendations  None recommended by OT       Precautions / Restrictions Precautions Precaution Comments: monitor O2  Restrictions Weight Bearing Restrictions: No      Mobility Bed Mobility Overal bed mobility: Independent                Transfers Overall transfer level: Independent                    Balance Overall balance assessment: No apparent balance deficits (not formally assessed)                                         ADL either performed or assessed with clinical judgement   ADL                                         General ADL Comments: Mod I. Educated pt on purse lipped breathing as well as other energy conservation strategies from handout (ie: no hot showers, sit to shower, sit to do kitchen tasks, don't keep hands in hot water to wash dishes, don't get in a hot care, etc..). I educated pt on correct use of inspirometer (pt currently can get to almost 750) and she knows she is to do them 10 times every hour she is awake. Recommended pt get a shower seat on her own since insurance does not cover these.     Vision Patient Visual Report: No change from baseline               Pertinent Vitals/Pain Pain Assessment: No/denies pain     Hand Dominance Right   Extremity/Trunk Assessment Upper Extremity Assessment Upper Extremity Assessment: Overall WFL for tasks assessed           Communication Communication Communication: No difficulties   Cognition Arousal/Alertness: Awake/alert Behavior During Therapy: WFL for tasks assessed/performed Overall Cognitive Status: Within Functional Limits for tasks assessed                                                Home Living Family/patient expects to be discharged to:: Private residence Living Arrangements: Spouse/significant other Available Help at Discharge: Family;Available PRN/intermittently Type of Home: House Home Access: Stairs to enter [employer] of Steps: 4 Entrance Stairs-Rails: Right;Left;Can reach both Home Layout: Two level;Laundry or work area in Building surveyor of Steps: 13 Alternate Level Stairs-Rails: Right Bathroom Shower/Tub: Teacher, early years/pre: Standard  Home Equipment: None          Prior Functioning/Environment Level of Independence: Independent                 OT Problem List: Cardiopulmonary status limiting activity         OT Goals(Current goals can be found in the care plan section) Acute Rehab OT Goals Patient Stated Goal: to go home  OT Frequency:             AM-PAC PT "6 Clicks" Daily Activity     Outcome Measure Help from another person eating meals?: None Help from another person taking care of personal grooming?: None Help from another person toileting, which includes using toliet, bedpan, or urinal?: None Help from another person bathing (including washing, rinsing, drying)?: None Help from another person to put on and taking off regular upper body clothing?: None Help from another person to put on and taking off regular lower body clothing?: None 6 Click Score: 24   End of  Session Equipment Utilized During Treatment: (O2--4 liters)  Activity Tolerance: Patient tolerated treatment well Patient left: in bed;with call bell/phone within reach;with bed alarm set;with family/visitor present  OT Visit Diagnosis: Other (comment)(cardio pulmonary issues')                Time: 6010-9323 OT Time Calculation (min): 35 min Charges:  OT General Charges $OT Visit: 1 Visit OT Evaluation $OT Eval Moderate Complexity: 1 Mod OT Treatments $Self Care/Home Management : 8-22 mins  [place], OTR/L Acute [employer] Pager [phone] Office [phone]

## 2018-09-11 NOTE — Evaluation (Signed)
Physical Therapy Evaluation Patient Details Name: Jeanett Antonopoulos Gillooly MRN: [id number] DOB: 07-31-1946 Today's Date: 09/11/2018   History of Present Illness   Myelle Poteat Cullinane is a 72 y.o. female with medical history significant for COPD not on home O2, HTN, hyperlipidemia, tobacco use, osteopenia, MGUS, who presented to urgent care today complaining of 5 days of progressive shortness of breath. Found to have Acute hypoxic resp failure 2/2 COPD exacerbation.  Clinical Impression  Pt admitted with above diagnosis. Pt currently with functional limitations due to the deficits listed below (see PT Problem List). Pt was able to ambulate without physical assist with min guard to supervision without device but did need O2 to keep sats >90%.  Will follow acutely.   Pt will benefit from skilled PT to increase their independence and safety with mobility to allow discharge to the venue listed below.    SATURATION QUALIFICATIONS: (This note is used to comply with regulatory documentation for home oxygen)  Patient Saturations on Room Air at Rest = 87%  Patient Saturations on Room Air while Ambulating = 84%  Patient Saturations on 6 Liters of oxygen while Ambulating = 91%  Please briefly explain why patient needs home oxygen:Pt requiring 2LO2 at rest and  6LO2 with activity to keep sats >90%.  Will need home O2 at current level.  Gave pt incentive spirometer and educated pt in use.    Follow Up Recommendations Other (comment)(Pulmonary rehab consult, HHRN for O2 sats monitoring)    Equipment Recommendations  (home O2)    Recommendations for Other Services       Precautions / Restrictions Precautions Precaution Comments: monitor O2  Restrictions Weight Bearing Restrictions: No      Mobility  Bed Mobility Overal bed mobility: Independent                Transfers Overall transfer level: Independent                  Ambulation/Gait Ambulation/Gait assistance: Supervision;Min  guard Gait Distance (Feet): 100 Feet Assistive device: None Gait Pattern/deviations: Step-through pattern;Decreased stride length   Gait velocity interpretation: <1.31 ft/sec, indicative of household ambulator General Gait Details: Pt without LOB but did not give more than min challenges to balance.    Stairs            Wheelchair Mobility    Modified Rankin (Stroke Patients Only)       Balance Overall balance assessment: No apparent balance deficits (not formally assessed)                                           Pertinent Vitals/Pain Pain Assessment: No/denies pain    Home Living Family/patient expects to be discharged to:: Private residence Living Arrangements: Spouse/significant other Available Help at Discharge: Family;Available PRN/intermittently Type of Home: House Home Access: Stairs to enter Entrance Stairs-Rails: Right;Left;Can reach both Entrance Stairs-Number of Steps: 4 Home Layout: Two level;Laundry or work area in [employer]: None      Prior Function Level of Independence: Independent               Journalist, newspaper   Dominant Hand: Right    Extremity/Trunk Assessment   Upper Extremity Assessment Upper Extremity Assessment: Defer to OT evaluation    Lower Extremity Assessment Lower Extremity Assessment: Generalized weakness    Cervical / Trunk Assessment Cervical / Trunk Assessment: Normal  Communication   Communication: No difficulties  Cognition Arousal/Alertness: Awake/alert Behavior During Therapy: WFL for tasks assessed/performed Overall Cognitive Status: Within Functional Limits for tasks assessed                                        General Comments      Exercises General Exercises - Lower Extremity Ankle Circles/Pumps: AROM;Both;10 reps;Seated Long Arc Quad: AROM;Both;10 reps;Seated   Assessment/Plan    PT Assessment Patient needs continued PT services  PT  Problem List Decreased activity tolerance;Decreased balance;Decreased knowledge of use of DME;Decreased safety awareness;Decreased mobility       PT Treatment Interventions DME instruction;Gait training;Functional mobility training;Therapeutic exercise;Therapeutic activities;Balance training;Patient/family education    PT Goals (Current goals can be found in the Care Plan section)  Acute Rehab PT Goals Patient Stated Goal: to go home PT Goal Formulation: With patient Time For Goal Achievement: 09/25/18 Potential to Achieve Goals: Good    Frequency Min 3X/week   Barriers to discharge        Co-evaluation               AM-PAC PT "6 Clicks" Daily Activity  Outcome Measure Difficulty turning over in bed (including adjusting bedclothes, sheets and blankets)?: None Difficulty moving from lying on back to sitting on the side of the bed? : None Difficulty sitting down on and standing up from a chair with arms (e.g., wheelchair, bedside commode, etc,.)?: None Help needed moving to and from a bed to chair (including a wheelchair)?: None Help needed walking in hospital room?: A Little Help needed climbing 3-5 steps with a railing? : A Little 6 Click Score: 22    End of Session Equipment Utilized During Treatment: Gait belt;Oxygen Activity Tolerance: Patient limited by fatigue Patient left: in chair;with call bell/phone within reach;with chair alarm set Nurse Communication: Mobility status PT Visit Diagnosis: Muscle weakness (generalized) (M62.81)    Time: 9379-0240 PT Time Calculation (min) (ACUTE ONLY): 30 min   Charges:   PT Evaluation $PT Eval Moderate Complexity: 1 Mod PT Treatments $Gait Training: 8-22 mins        [place] Pager:  [phone]  Office:  [place] 09/11/2018, 1:36 PM

## 2018-09-11 NOTE — Progress Notes (Signed)
SATURATION QUALIFICATIONS: (This note is used to comply with regulatory documentation for home oxygen)  Patient Saturations on Room Air at Rest = 87%  Patient Saturations on Room Air while Ambulating = 84%  Patient Saturations on 6 Liters of oxygen while Ambulating = 91%  Please briefly explain why patient needs home oxygen:Pt requiring 2LO2 at rest and  6LO2 with activity to keep sats >90%.  Will need home O2 at current level.  Gave pt incentive spirometer and educated pt in use.  Will follow acutely. Thanks. [place] Pager:  [phone]  Office:  [phone]

## 2018-09-11 NOTE — Progress Notes (Signed)
PROGRESS NOTE  Tiffany [name] GDJ:[id number] DOB: 22-Aug-1946 DOA: 09/10/2018 PCP: Rita Ohara, MD  HPI/Recap of past 24 hours:  Remain oxygen dependent, denies chest pain, no fever Report cough is dry, not able to cough up, over all less cough Reports nasal congestion No edema  Assessment/Plan: Principal Problem:   Acute on chronic respiratory failure with hypoxia (HCC) Active Problems:   Tobacco use disorder   Vitamin D deficiency   Pure hypercholesterolemia   Osteopenia   Essential hypertension   Atherosclerosis of right coronary artery   Dyspnea   COPD with acute exacerbation (HCC)   Acute hypoxic resp failure 2/2 COPD exacerbation.  -She was 80% on RA on presentation -Moderate/severe exacerbation as evidenced by increased dyspnea, hypoxia and increased sputum -flu pcr negative, cxr no acute infiltrate, review previous screening chest CT does has emphysema changes -remain oxygen dependent, reports not able to cough up, continue steroids/nebs/abx, add mucinex  CAD -cont baby ASA, cozaar, zocor  Essential HTN -cont cozaar  Smoking cessation education provided Nicotine patch  Tele unremarkable, d/c tele    Code Status: full  Family Communication: patient   Disposition Plan: possible d/c in 1-2 days , will need to ambulate in am , may need home 02   Consultants:  none  Procedures:  none  Antibiotics:  doxycycline   Objective: BP 97/82   Pulse 71   Temp 97.6 F (36.4 C) (Oral)   Resp 18   Ht 5\' 7"  (1.702 m)   Wt 63.7 kg Comment: Scale B  SpO2 91%   BMI 22.01 kg/m   Intake/Output Summary (Last 24 hours) at 09/11/2018 1823 Last data filed at 09/11/2018 1700 Gross per 24 hour  Intake 1210 ml  Output 1906 ml  Net -696 ml   Filed Weights   09/10/18 1746 09/11/18 0515  Weight: 63.8 kg 63.7 kg    Exam: Patient is examined daily including today on 09/11/2018, exams remain the same as of yesterday except that has changed     General:  NAD  Cardiovascular: RRR  Respiratory: diminished overall, prolonged expiratory phase, mild end expiratory wheezing  Abdomen: Soft/ND/NT, positive BS  Musculoskeletal: No Edema  Neuro: alert, oriented   Data Reviewed: Basic Metabolic Panel: Recent Labs  Lab 09/10/18 1245 09/11/18 0554  NA 135 136  K 3.9 4.2  CL 99 96*  CO2 26 31  GLUCOSE 122* 95  BUN 9 7*  CREATININE 0.70 0.59  CALCIUM 8.8* 8.5*   Liver Function Tests: No results for input(s): AST, ALT, ALKPHOS, BILITOT, PROT, ALBUMIN in the last 168 hours. No results for input(s): LIPASE, AMYLASE in the last 168 hours. No results for input(s): AMMONIA in the last 168 hours. CBC: Recent Labs  Lab 09/10/18 1245 09/11/18 0805  WBC 6.3 5.7  HGB 15.0 13.5  HCT 47.4* 43.4  MCV 96.7 96.7  PLT 184 175   Cardiac Enzymes:   No results for input(s): CKTOTAL, CKMB, CKMBINDEX, TROPONINI in the last 168 hours. BNP (last 3 results) No results for input(s): BNP in the last 8760 hours.  ProBNP (last 3 results) No results for input(s): PROBNP in the last 8760 hours.  CBG: No results for input(s): GLUCAP in the last 168 hours.  No results found for this or any previous visit (from the past 240 hour(s)).   Studies: No results found.  Scheduled Meds: . aspirin EC  81 mg Oral Daily  . enoxaparin (LOVENOX) injection  40 mg Subcutaneous Q24H  . fluticasone  2 spray Each Nare Daily  . guaiFENesin  600 mg Oral BID  . ipratropium-albuterol  3 mL Nebulization TID  . losartan  50 mg Oral Daily  . nicotine  21 mg Transdermal Daily  . predniSONE  40 mg Oral Q breakfast  . simvastatin  20 mg Oral q1800  . sodium chloride flush  3 mL Intravenous Q12H    Continuous Infusions: . sodium chloride    . doxycycline (VIBRAMYCIN) IV 200 mg (09/10/18 2220)     Time spent: 52mins I have personally reviewed and interpreted on  09/11/2018 daily labs, tele strips, imagings as discussed above under date review session  and assessment and plans.  I reviewed all nursing notes, pharmacy notes,  vitals, pertinent old records  I have discussed plan of care as described above with RN , patient  on 09/11/2018   Florencia Reasons MD, PhD  [facility] Pager [phone]. If 7PM-7AM, please contact night-coverage at [email or web address], password [facility] 09/11/2018, 6:23 PM  LOS: 1 day

## 2018-09-11 NOTE — Progress Notes (Signed)
SATURATION QUALIFICATIONS: (This note is used to comply with regulatory documentation for home oxygen)  Patient Saturations on Room Air at Rest = 87%  Patient Saturations on Room Air while Ambulating = 84%  Patient Saturations on 4 Liters of oxygen while Ambulating = 92%  Please briefly explain why patient needs home oxygen: pt with decrease in sats on RA at rest with further decrease with ambulation. Tried 2, 3 and then 4 liters to get up to 92% with pt purse lipped breathing to recover. [place], OTR/L Acute Rehab Services Pager [phone] Office [phone]

## 2018-09-12 LAB — STREP PNEUMONIAE URINARY ANTIGEN: STREP PNEUMO URINARY ANTIGEN: NEGATIVE

## 2018-09-12 MED ORDER — IPRATROPIUM-ALBUTEROL 0.5-2.5 (3) MG/3ML IN SOLN
3.0000 mL | Freq: Four times a day (QID) | RESPIRATORY_TRACT | Status: DC
  Administered 2018-09-12 (×2): 3 mL via RESPIRATORY_TRACT
  Filled 2018-09-12 (×2): qty 3

## 2018-09-12 MED ORDER — METHYLPREDNISOLONE SODIUM SUCC 125 MG IJ SOLR
60.0000 mg | Freq: Three times a day (TID) | INTRAMUSCULAR | Status: DC
  Administered 2018-09-12 – 2018-09-13 (×4): 60 mg via INTRAVENOUS
  Filled 2018-09-12 (×4): qty 2

## 2018-09-12 NOTE — Care Management Note (Signed)
Case Management Note  Patient Details  Name: Tiffany [name] MRN: [id number] Date of Birth: [birth year]  Subjective/Objective:   COPD              Action/Plan: Patient lives at home with spouse; PCP is Dr Aris [place]; has private insurance with BCBS with prescription drug coverage; pharmacy of choice is [facility] on [address] [place]; patient reports no problem getting her medication, she continues to drive her care, no DME at this time; she does smoke but states that she plans to quit. No needs identified at this time. CM will continue to follow for progression of care.  Expected Discharge Date:  Possibly 09/15/2018              Expected Discharge Plan:  Home/Self Care  Discharge planning Services  CM Consult  Status of Service:  In process, will continue to follow  Sherrilyn Rist [phone] 09/12/2018, 11:22 AM

## 2018-09-12 NOTE — Progress Notes (Signed)
Physical Therapy Treatment & Discharge Patient Details Name: Tiffany [name] MRN: [id number] DOB: October 28, 1946 Today's Date: 09/12/2018    History of Present Illness Pt is a 72 y.o. female with PMH of COPD (not on home O2), HTN, tobacco use, osteopenia, admitted 09/10/18 with 5-day history of SOB. Found to have acute hypoxic respiratory failure 2/2 COPD exacerbation.   PT Comments    Pt has progressed well with mobility. Indep with transfers, ambulation, and ADLs. SpO2 down to 88% on 3L O2 [place] (see SATURATIONS QUALIFICATIONS note); pt cued for energy conservation, including rest breaks and deep breathing technique. Able to ascend/descend steps with rail support and supervision. Pt has met short-term acute PT goals. Has no further questions or concerns. Will d/c acute PT.   Follow Up Recommendations  (Outpatient pulmonary rehab)     Equipment Recommendations  None recommended by PT    Recommendations for Other Services       Precautions / Restrictions Precautions Precautions: Fall Precaution Comments: monitor O2  Restrictions Weight Bearing Restrictions: No    Mobility  Bed Mobility Overal bed mobility: Independent                Transfers Overall transfer level: Independent                  Ambulation/Gait Ambulation/Gait assistance: Independent Gait Distance (Feet): 250 Feet Assistive device: None Gait Pattern/deviations: Step-through pattern;Decreased stride length Gait velocity: Decreased Gait velocity interpretation: 1.31 - 2.62 ft/sec, indicative of limited community ambulator General Gait Details: Cued to take intermittent standing rest breaks with deep breathing secondary to decrease in SpO2; 88% on 3L O2 [place]   Stairs Stairs: Yes Stairs assistance: Supervision Stair Management: One rail Right;One rail Left;Alternating pattern;Forwards Number of Stairs: 3 General stair comments: Ascend/descended 3 steps with single UE support on rail; supervision  for safety   Wheelchair Mobility    Modified [place] (Stroke Patients Only)       Balance Overall balance assessment: No apparent balance deficits (not formally assessed)                                          Cognition Arousal/Alertness: Awake/alert Behavior During Therapy: WFL for tasks assessed/performed Overall Cognitive Status: Within Functional Limits for tasks assessed                                        Exercises      General Comments        Pertinent Vitals/Pain Pain Assessment: Faces Faces Pain Scale: Hurts a little bit Pain Location: Bilateral hips "from this bed" Pain Descriptors / Indicators: Sore Pain Intervention(s): Monitored during session    Home Living                      Prior Function            PT Goals (current goals can now be found in the care plan section) Acute Rehab PT Goals Patient Stated Goal: to go home PT Goal Formulation: With patient Time For Goal Achievement: 09/25/18 Potential to Achieve Goals: Good Progress towards PT goals: Goals met/education completed, patient discharged from PT    Frequency    Min 3X/week      PT Plan Current plan remains appropriate  Co-evaluation              AM-PAC PT "6 Clicks" Daily Activity  Outcome Measure  Difficulty turning over in bed (including adjusting bedclothes, sheets and blankets)?: None Difficulty moving from lying on back to sitting on the side of the bed? : None Difficulty sitting down on and standing up from a chair with arms (e.g., wheelchair, bedside commode, etc,.)?: None Help needed moving to and from a bed to chair (including a wheelchair)?: None Help needed walking in hospital room?: None Help needed climbing 3-5 steps with a railing? : A Little 6 Click Score: 23    End of Session Equipment Utilized During Treatment: Gait belt;Oxygen Activity Tolerance: Patient tolerated treatment well Patient left: in  chair;with call bell/phone within reach Nurse Communication: Mobility status PT Visit Diagnosis: Muscle weakness (generalized) (M62.81)     Time: 6767-2094 PT Time Calculation (min) (ACUTE ONLY): 14 min  Charges:  $Gait Training: 8-22 mins                     Mabeline Caras, PT, DPT Acute Rehabilitation Services  Pager [phone] Office [place] 09/12/2018, 10:48 AM

## 2018-09-12 NOTE — Care Management Important Message (Signed)
Important Message  Patient Details  Name: Tiffany [name] [place] MRN: [id number] Date of Birth: Sep 18, 1946   Medicare Important Message Given:  Yes    Orbie Pyo 09/12/2018, 3:48 PM

## 2018-09-12 NOTE — Progress Notes (Signed)
PROGRESS NOTE  Tiffany [name] EZM:[id number] DOB: [birth year] DOA: 09/10/2018 PCP: Rita Ohara, MD  HPI/Recap of past 24 hours:  Continue to wheeze, no cough is dry, not able to cough up, remain oxygen dependent,  denies chest pain, no fever  Reports less  nasal congestion No edema  Husband at bedside  Assessment/Plan: Principal Problem:   Acute on chronic respiratory failure with hypoxia ([place]) Active Problems:   Tobacco use disorder   Vitamin D deficiency   Pure hypercholesterolemia   Osteopenia   Essential hypertension   Atherosclerosis of right coronary artery   Dyspnea   COPD with acute exacerbation (HCC)   Acute hypoxic resp failure 2/2 COPD exacerbation.  -She was 80% on RA on presentation -Moderate/severe exacerbation as evidenced by increased dyspnea, hypoxia and increased sputum -flu pcr negative, cxr no acute infiltrate, review previous screening chest CT does has emphysema changes -urine strep pneumo antigen negative -remain oxygen dependent, reports not able to cough up,  Remain to have diffuse wheezing, will increase steroids and nebs, continue doxycycline and  mucinex  CAD -cont baby ASA, cozaar, zocor  Essential HTN -cont cozaar  Smoking cessation education provided Nicotine patch  Tele unremarkable, d/c tele    Code Status: full  Family Communication: patient  And husband at bedside  Disposition Plan: possible d/c in 1-2 days , will need to ambulate in am , may need home 02   Consultants:  none  Procedures:  none  Antibiotics:  doxycycline   Objective: BP (!) 121/57   Pulse 66   Temp 98.2 F (36.8 C) (Oral)   Resp 19   Ht 5\' 7"  (1.702 m)   Wt 63.8 kg Comment: Scale B  SpO2 96%   BMI 22.02 kg/m   Intake/Output Summary (Last 24 hours) at 09/12/2018 1202 Last data filed at 09/11/2018 2230 Gross per 24 hour  Intake 880 ml  Output 1300 ml  Net -420 ml   Filed Weights   09/10/18 1746 09/11/18 0515 09/12/18 0414    Weight: 63.8 kg 63.7 kg 63.8 kg    Exam: Patient is examined daily including today on 09/12/2018, exams remain the same as of yesterday except that has changed    General:  NAD  Cardiovascular: RRR  Respiratory: diffuse bilateral wheezing  Abdomen: Soft/ND/NT, positive BS  Musculoskeletal: No Edema  Neuro: alert, oriented   Data Reviewed: Basic Metabolic Panel: Recent Labs  Lab 09/10/18 1245 09/11/18 0554  NA 135 136  K 3.9 4.2  CL 99 96*  CO2 26 31  GLUCOSE 122* 95  BUN 9 7*  CREATININE 0.70 0.59  CALCIUM 8.8* 8.5*   Liver Function Tests: No results for input(s): AST, ALT, ALKPHOS, BILITOT, PROT, ALBUMIN in the last 168 hours. No results for input(s): LIPASE, AMYLASE in the last 168 hours. No results for input(s): AMMONIA in the last 168 hours. CBC: Recent Labs  Lab 09/10/18 1245 09/11/18 0805  WBC 6.3 5.7  HGB 15.0 13.5  HCT 47.4* 43.4  MCV 96.7 96.7  PLT 184 175   Cardiac Enzymes:   No results for input(s): CKTOTAL, CKMB, CKMBINDEX, TROPONINI in the last 168 hours. BNP (last 3 results) No results for input(s): BNP in the last 8760 hours.  ProBNP (last 3 results) No results for input(s): PROBNP in the last 8760 hours.  CBG: No results for input(s): GLUCAP in the last 168 hours.  No results found for this or any previous visit (from the past 240 hour(s)).   Studies:  No results found.  Scheduled Meds: . aspirin EC  81 mg Oral Daily  . doxycycline  100 mg Oral Q12H  . enoxaparin (LOVENOX) injection  40 mg Subcutaneous Q24H  . fluticasone  2 spray Each Nare Daily  . guaiFENesin  600 mg Oral BID  . ipratropium-albuterol  3 mL Nebulization Q6H  . losartan  50 mg Oral Daily  . methylPREDNISolone (SOLU-MEDROL) injection  60 mg Intravenous Q8H  . nicotine  21 mg Transdermal Daily  . simvastatin  20 mg Oral q1800  . sodium chloride flush  3 mL Intravenous Q12H    Continuous Infusions: . sodium chloride       Time spent: 1mins I have  personally reviewed and interpreted on  09/12/2018 daily labs,  imagings as discussed above under date review session and assessment and plans.  I reviewed all nursing notes, pharmacy notes,  vitals, pertinent old records  I have discussed plan of care as described above with RN , patient  on 09/12/2018   Florencia Reasons MD, PhD  [facility] Pager [phone]. If 7PM-7AM, please contact night-coverage at [email or web address], password [facility] 09/12/2018, 12:02 PM  LOS: 2 days

## 2018-09-12 NOTE — Progress Notes (Signed)
SATURATION QUALIFICATIONS: (This note is used to comply with regulatory documentation for home oxygen)  Patient Saturations on Room Air at Rest = 84%  Patient Saturations on Room Air while Ambulating = 81%  Patient Saturations on 3 Liters of oxygen while Ambulating = 88%  Please briefly explain why patient needs home oxygen: Pt requires supplement oxygen to maintain saturations >/88% with mobility.  Mabeline Caras, PT, DPT Acute Rehabilitation Services  Pager [phone] Office [phone]

## 2018-09-13 LAB — CBC WITH DIFFERENTIAL/PLATELET
BASOS ABS: 0 10*3/uL (ref 0.0–0.1)
BASOS PCT: 0 %
EOS ABS: 0 10*3/uL (ref 0.0–0.5)
EOS PCT: 0 %
HCT: 43.9 % (ref 36.0–46.0)
Hemoglobin: 14.3 g/dL (ref 12.0–15.0)
Lymphocytes Relative: 8 %
Lymphs Abs: 0.7 10*3/uL (ref 0.7–4.0)
MCH: 31.6 pg (ref 26.0–34.0)
MCHC: 32.6 g/dL (ref 30.0–36.0)
MCV: 97.1 fL (ref 80.0–100.0)
MONOS PCT: 4 %
Monocytes Absolute: 0.4 10*3/uL (ref 0.1–1.0)
NEUTROS ABS: 7.8 10*3/uL — AB (ref 1.7–7.7)
NEUTROS PCT: 88 %
PLATELETS: 228 10*3/uL (ref 150–400)
RBC: 4.52 MIL/uL (ref 3.87–5.11)
RDW: 12.2 % (ref 11.5–15.5)
WBC: 8.9 10*3/uL (ref 4.0–10.5)
nRBC: 0 % (ref 0.0–0.2)
nRBC: 0 /100 WBC

## 2018-09-13 LAB — BASIC METABOLIC PANEL
ANION GAP: 10 (ref 5–15)
BUN: 17 mg/dL (ref 8–23)
CALCIUM: 9 mg/dL (ref 8.9–10.3)
CO2: 31 mmol/L (ref 22–32)
Chloride: 95 mmol/L — ABNORMAL LOW (ref 98–111)
Creatinine, Ser: 0.7 mg/dL (ref 0.44–1.00)
GLUCOSE: 100 mg/dL — AB (ref 70–99)
Potassium: 4.5 mmol/L (ref 3.5–5.1)
SODIUM: 136 mmol/L (ref 135–145)

## 2018-09-13 LAB — MAGNESIUM: Magnesium: 2.1 mg/dL (ref 1.7–2.4)

## 2018-09-13 MED ORDER — IPRATROPIUM-ALBUTEROL 0.5-2.5 (3) MG/3ML IN SOLN
3.0000 mL | Freq: Two times a day (BID) | RESPIRATORY_TRACT | Status: DC
  Administered 2018-09-14: 3 mL via RESPIRATORY_TRACT
  Filled 2018-09-13: qty 3

## 2018-09-13 MED ORDER — METHYLPREDNISOLONE SODIUM SUCC 125 MG IJ SOLR
60.0000 mg | Freq: Two times a day (BID) | INTRAMUSCULAR | Status: DC
  Administered 2018-09-14 – 2018-09-15 (×3): 60 mg via INTRAVENOUS
  Filled 2018-09-13 (×3): qty 2

## 2018-09-13 MED ORDER — IPRATROPIUM-ALBUTEROL 0.5-2.5 (3) MG/3ML IN SOLN
3.0000 mL | Freq: Three times a day (TID) | RESPIRATORY_TRACT | Status: DC
  Administered 2018-09-13 (×2): 3 mL via RESPIRATORY_TRACT
  Filled 2018-09-13 (×2): qty 3

## 2018-09-13 NOTE — Progress Notes (Signed)
PROGRESS NOTE  Tiffany [name] NOM:[id number] DOB: 04-19-1946 DOA: 09/10/2018 PCP: Rita Ohara, MD  HPI/Recap of past 24 hours:  Continue to wheeze, feeling a little better, now start to able to cough up Nasal congestion is improving o2 dropped to 77% while ambulating on room air this am She denies pain, no fever, no edema  Assessment/Plan: Principal Problem:   Acute on chronic respiratory failure with hypoxia ([place]) Active Problems:   Tobacco use disorder   Vitamin D deficiency   Pure hypercholesterolemia   Osteopenia   Essential hypertension   Atherosclerosis of right coronary artery   Dyspnea   COPD with acute exacerbation (HCC)   Acute hypoxic resp failure 2/2 COPD exacerbation.  -She was 80% on RA on presentation -Moderate/severe exacerbation as evidenced by increased dyspnea, hypoxia and increased sputum -flu pcr negative, cxr no acute infiltrate, review previous screening chest CT does has emphysema changes -urine strep pneumo antigen negative -remain oxygen dependent,  Remain wheezing, but improving -taper steroids and nebs, continue doxycycline and  mucinex  CAD -cont baby ASA, cozaar, zocor  Essential HTN -cont cozaar  Smoking cessation education provided Nicotine patch     Code Status: full  Family Communication: patient  And husband at bedside  Disposition Plan: possible d/c in 1-2 days , likely will need home 02   Consultants:  none  Procedures:  none  Antibiotics:  doxycycline    Objective: BP 140/66   Pulse 73   Temp 98.2 F (36.8 C) (Oral)   Resp 20   Ht 5\' 7"  (1.702 m)   Wt 63.9 kg Comment: scale b  SpO2 94%   BMI 22.07 kg/m   Intake/Output Summary (Last 24 hours) at 09/13/2018 1628 Last data filed at 09/13/2018 1300 Gross per 24 hour  Intake 1080 ml  Output 650 ml  Net 430 ml   Filed Weights   09/11/18 0515 09/12/18 0414 09/13/18 0530  Weight: 63.7 kg 63.8 kg 63.9 kg    Exam: Patient is  examined daily including today on 09/13/2018, exams remain the same as of yesterday except that has changed    General:  NAD  Cardiovascular: RRR  Respiratory: prolonged expiratory phase, remain bilateral wheezing but seems less significant compare to yesterday  Abdomen: Soft/ND/NT, positive BS  Musculoskeletal: No Edema  Neuro: alert, oriented   Data Reviewed: Basic Metabolic Panel: Recent Labs  Lab 09/10/18 1245 09/11/18 0554 09/13/18 0617  NA 135 136 136  K 3.9 4.2 4.5  CL 99 96* 95*  CO2 26 31 31   GLUCOSE 122* 95 100*  BUN 9 7* 17  CREATININE 0.70 0.59 0.70  CALCIUM 8.8* 8.5* 9.0  MG  --   --  2.1   Liver Function Tests: No results for input(s): AST, ALT, ALKPHOS, BILITOT, PROT, ALBUMIN in the last 168 hours. No results for input(s): LIPASE, AMYLASE in the last 168 hours. No results for input(s): AMMONIA in the last 168 hours. CBC: Recent Labs  Lab 09/10/18 1245 09/11/18 0805 09/13/18 0617  WBC 6.3 5.7 8.9  NEUTROABS  --   --  7.8*  HGB 15.0 13.5 14.3  HCT 47.4* 43.4 43.9  MCV 96.7 96.7 97.1  PLT 184 175 228   Cardiac Enzymes:   No results for input(s): CKTOTAL, CKMB, CKMBINDEX, TROPONINI in the last 168 hours. BNP (last 3 results) No results for input(s): BNP in the last 8760 hours.  ProBNP (last 3 results) No results for input(s): PROBNP in the last 8760 hours.  CBG: No results for input(s): GLUCAP in the last 168 hours.  No results found for this or any previous visit (from the past 240 hour(s)).   Studies: No results found.  Scheduled Meds: . aspirin EC  81 mg Oral Daily  . doxycycline  100 mg Oral Q12H  . enoxaparin (LOVENOX) injection  40 mg Subcutaneous Q24H  . fluticasone  2 spray Each Nare Daily  . guaiFENesin  600 mg Oral BID  . ipratropium-albuterol  3 mL Nebulization TID  . losartan  50 mg Oral Daily  . methylPREDNISolone (SOLU-MEDROL) injection  60 mg Intravenous Q8H  . nicotine  21 mg Transdermal Daily  . simvastatin  20 mg  Oral q1800  . sodium chloride flush  3 mL Intravenous Q12H    Continuous Infusions: . sodium chloride       Time spent: 63mins I have personally reviewed and interpreted on  09/13/2018 daily labs, imagings as discussed above under date review session and assessment and plans.  I reviewed all nursing notes, pharmacy notes,  vitals, pertinent old records  I have discussed plan of care as described above with RN , patient on 09/13/2018   Florencia Reasons MD, PhD  [facility] Pager [phone]. If 7PM-7AM, please contact night-coverage at [email or web address], password [facility] 09/13/2018, 4:28 PM  LOS: 3 days

## 2018-09-13 NOTE — Progress Notes (Signed)
SATURATION QUALIFICATIONS: (This note is used to comply with regulatory documentation for home oxygen)  Patient Saturations on Room Air at Rest = 96%  Patient Saturations on Room Air while Ambulating = 77%  Patient Saturations on 2 Liters of oxygen while Ambulating = 93%   Pt needs home oxygen, her oxygen dropped to 77% in RA while ambulating

## 2018-09-13 NOTE — Plan of Care (Signed)

## 2018-09-14 MED ORDER — GUAIFENESIN 100 MG/5ML PO SOLN: 5.0000 mL | ORAL | Status: DC | PRN

## 2018-09-14 MED ORDER — IPRATROPIUM-ALBUTEROL 0.5-2.5 (3) MG/3ML IN SOLN
3.0000 mL | Freq: Three times a day (TID) | RESPIRATORY_TRACT | Status: DC
  Administered 2018-09-14: 3 mL via RESPIRATORY_TRACT
  Filled 2018-09-14: qty 3

## 2018-09-14 NOTE — Progress Notes (Signed)
SATURATION QUALIFICATIONS: (This note is used to comply with regulatory documentation for home oxygen)  Patient Saturations on Room Air at Rest  85%  Patient Saturations on Room Air while Ambulating = 80%  Patient Saturations on 2 Liters of oxygen while Ambulating = 92%  Pt walked in a hallway without distress, but her saturation dropped to 80%.   Palma Holter, RN

## 2018-09-14 NOTE — Progress Notes (Signed)
PROGRESS NOTE  Tiffany [name] [id number] DOB: 1946/08/30 DOA: 09/10/2018 PCP: Rita Ohara, MD  HPI/Recap of past 24 hours:  Reports cough up green sputum,  Reports feeling better, but Continue to wheeze, reports not at baseline yet, she is able to walk 14stairs at baseline, today she only went up three stairs due to wheezing and sob  o2 dropped to 77% while ambulating on room air on 10/19 She denies pain, no fever, no edema Husband at bedside  Assessment/Plan: Principal Problem:   Acute on chronic respiratory failure with hypoxia ([place]) Active Problems:   Tobacco use disorder   Vitamin D deficiency   Pure hypercholesterolemia   Osteopenia   Essential hypertension   Atherosclerosis of right coronary artery   Dyspnea   COPD with acute exacerbation (HCC)   Acute hypoxic resp failure 2/2 COPD exacerbation.  -She was 80% on RA on presentation -Moderate/severe exacerbation as evidenced by increased dyspnea, hypoxia and increased sputum -flu pcr negative, cxr no acute infiltrate, review previous screening chest CT does has emphysema changes -urine strep pneumo antigen negative --continue to have bilateral wheezing, remain oxygen dependent -continue iv steroids and nebs, continue doxycycline and  mucinex, sputum culture pending collection  CAD -cont baby ASA, cozaar, zocor  Essential HTN -cont cozaar  Smoking cessation education provided Nicotine patch     Code Status: full  Family Communication: patient  And husband at bedside  Disposition Plan: possible d/c in 1-2 days , likely will need home 02   Consultants:  none  Procedures:  none  Antibiotics:  doxycycline    Objective: BP (!) 125/53   Pulse 61   Temp 97.9 F (36.6 C) (Oral)   Resp 20   Ht 5\' 7"  (1.702 m)   Wt 63.5 kg Comment: scale b  SpO2 91%   BMI 21.94 kg/m   Intake/Output Summary (Last 24 hours) at 09/14/2018 1557 Last data filed at 09/14/2018 0900 Gross per 24  hour  Intake 600 ml  Output 1450 ml  Net -850 ml   Filed Weights   09/12/18 0414 09/13/18 0530 09/14/18 0549  Weight: 63.8 kg 63.9 kg 63.5 kg    Exam: Patient is examined daily including today on 09/14/2018, exams remain the same as of yesterday except that has changed    General:  NAD  Cardiovascular: RRR  Respiratory: prolonged expiratory phase, persistent  bilateral wheezing   Abdomen: Soft/ND/NT, positive BS  Musculoskeletal: No Edema  Neuro: alert, oriented   Data Reviewed: Basic Metabolic Panel: Recent Labs  Lab 09/10/18 1245 09/11/18 0554 09/13/18 0617  NA 135 136 136  K 3.9 4.2 4.5  CL 99 96* 95*  CO2 26 31 31   GLUCOSE 122* 95 100*  BUN 9 7* 17  CREATININE 0.70 0.59 0.70  CALCIUM 8.8* 8.5* 9.0  MG  --   --  2.1   Liver Function Tests: No results for input(s): AST, ALT, ALKPHOS, BILITOT, PROT, ALBUMIN in the last 168 hours. No results for input(s): LIPASE, AMYLASE in the last 168 hours. No results for input(s): AMMONIA in the last 168 hours. CBC: Recent Labs  Lab 09/10/18 1245 09/11/18 0805 09/13/18 0617  WBC 6.3 5.7 8.9  NEUTROABS  --   --  7.8*  HGB 15.0 13.5 14.3  HCT 47.4* 43.4 43.9  MCV 96.7 96.7 97.1  PLT 184 175 228   Cardiac Enzymes:   No results for input(s): CKTOTAL, CKMB, CKMBINDEX, TROPONINI in the last 168 hours. BNP (last 3 results) No  results for input(s): BNP in the last 8760 hours.  ProBNP (last 3 results) No results for input(s): PROBNP in the last 8760 hours.  CBG: No results for input(s): GLUCAP in the last 168 hours.  No results found for this or any previous visit (from the past 240 hour(s)).   Studies: No results found.  Scheduled Meds: . aspirin EC  81 mg Oral Daily  . doxycycline  100 mg Oral Q12H  . enoxaparin (LOVENOX) injection  40 mg Subcutaneous Q24H  . fluticasone  2 spray Each Nare Daily  . guaiFENesin  600 mg Oral BID  . ipratropium-albuterol  3 mL Nebulization TID  . losartan  50 mg Oral Daily    . methylPREDNISolone (SOLU-MEDROL) injection  60 mg Intravenous Q12H  . nicotine  21 mg Transdermal Daily  . simvastatin  20 mg Oral q1800  . sodium chloride flush  3 mL Intravenous Q12H    Continuous Infusions: . sodium chloride       Time spent: 61mins I have personally reviewed and interpreted on  09/14/2018 daily labs, imagings as discussed above under date review session and assessment and plans.  I reviewed all nursing notes, pharmacy notes,  vitals, pertinent old records  I have discussed plan of care as described above with RN , patient and husband  on 09/14/2018   Florencia Reasons MD, PhD  [facility] Pager [phone]. If 7PM-7AM, please contact night-coverage at [email or web address], password [facility] 09/14/2018, 3:57 PM  LOS: 4 days

## 2018-09-15 LAB — CBC WITH DIFFERENTIAL/PLATELET
ABS IMMATURE GRANULOCYTES: 0.03 10*3/uL (ref 0.00–0.07)
BASOS PCT: 0 %
Basophils Absolute: 0 10*3/uL (ref 0.0–0.1)
Eosinophils Absolute: 0 10*3/uL (ref 0.0–0.5)
Eosinophils Relative: 0 %
HCT: 45.7 % (ref 36.0–46.0)
Hemoglobin: 14.4 g/dL (ref 12.0–15.0)
IMMATURE GRANULOCYTES: 0 %
Lymphocytes Relative: 7 %
Lymphs Abs: 0.6 10*3/uL — ABNORMAL LOW (ref 0.7–4.0)
MCH: 30.6 pg (ref 26.0–34.0)
MCHC: 31.5 g/dL (ref 30.0–36.0)
MCV: 97 fL (ref 80.0–100.0)
Monocytes Absolute: 0.2 10*3/uL (ref 0.1–1.0)
Monocytes Relative: 3 %
NEUTROS ABS: 6.9 10*3/uL (ref 1.7–7.7)
NEUTROS PCT: 90 %
PLATELETS: 217 10*3/uL (ref 150–400)
RBC: 4.71 MIL/uL (ref 3.87–5.11)
RDW: 12.2 % (ref 11.5–15.5)
WBC: 7.8 10*3/uL (ref 4.0–10.5)
nRBC: 0 % (ref 0.0–0.2)

## 2018-09-15 LAB — BASIC METABOLIC PANEL
ANION GAP: 6 (ref 5–15)
BUN: 22 mg/dL (ref 8–23)
CO2: 32 mmol/L (ref 22–32)
Calcium: 8.8 mg/dL — ABNORMAL LOW (ref 8.9–10.3)
Chloride: 98 mmol/L (ref 98–111)
Creatinine, Ser: 0.67 mg/dL (ref 0.44–1.00)
GFR calc Af Amer: >60 mL/min (ref >60)
Glucose, Bld: 127 mg/dL — ABNORMAL HIGH (ref 70–99)
POTASSIUM: 4.6 mmol/L (ref 3.5–5.1)
Sodium: 136 mmol/L (ref 135–145)

## 2018-09-15 LAB — HEPATIC FUNCTION PANEL
ALT: 28 U/L (ref 0–44)
AST: 22 U/L (ref 15–41)
Albumin: 3.4 g/dL — ABNORMAL LOW (ref 3.5–5.0)
Alkaline Phosphatase: 76 U/L (ref 38–126)
BILIRUBIN INDIRECT: 0.4 mg/dL (ref 0.3–0.9)
Bilirubin, Direct: 0.1 mg/dL (ref 0.0–0.2)
TOTAL PROTEIN: 6.5 g/dL (ref 6.5–8.1)
Total Bilirubin: 0.5 mg/dL (ref 0.3–1.2)

## 2018-09-15 MED ORDER — PREDNISONE 50 MG PO TABS
60.0000 mg | ORAL_TABLET | Freq: Every day | ORAL | Status: DC
  Administered 2018-09-16: 60 mg via ORAL
  Filled 2018-09-15: qty 1

## 2018-09-15 MED ORDER — METHYLPREDNISOLONE SODIUM SUCC 125 MG IJ SOLR
60.0000 mg | Freq: Two times a day (BID) | INTRAMUSCULAR | Status: AC
  Administered 2018-09-15 – 2018-09-16 (×2): 60 mg via INTRAVENOUS
  Filled 2018-09-15 (×2): qty 2

## 2018-09-15 MED ORDER — IPRATROPIUM-ALBUTEROL 0.5-2.5 (3) MG/3ML IN SOLN
3.0000 mL | Freq: Two times a day (BID) | RESPIRATORY_TRACT | Status: DC
  Administered 2018-09-15 – 2018-09-16 (×3): 3 mL via RESPIRATORY_TRACT
  Filled 2018-09-15 (×2): qty 3

## 2018-09-15 NOTE — Progress Notes (Signed)
PROGRESS NOTE  Tiffany [name] [id number] DOB: [birth year] DOA: 09/10/2018 PCP: Rita Ohara, MD  HPI/Recap of past 24 hours:  Reports cough up green sputum,  less wheeze, reports not at baseline yet, she is able to walk 14stairs at baseline,    She denies chest pain, no fever, no edema Husband and son at bedside  Assessment/Plan: Principal Problem:   Acute on chronic respiratory failure with hypoxia ([place]) Active Problems:   Tobacco use disorder   Vitamin D deficiency   Pure hypercholesterolemia   Osteopenia   Essential hypertension   Atherosclerosis of right coronary artery   Dyspnea   COPD with acute exacerbation ([place])   Acute hypoxic resp failure 2/2 COPD exacerbation.  -She was 80% on RA on presentation -Moderate/severe exacerbation as evidenced by increased dyspnea, hypoxia and increased sputum -flu pcr negative, cxr no acute infiltrate, review previous screening chest CT does has emphysema changes -urine strep pneumo antigen negative --continue to have bilateral wheezing, remain oxygen dependent -taper steroids and nebs, continue doxycycline and  mucinex, sputum culture pending collection  CAD -cont baby ASA, cozaar, zocor  Essential HTN -cont cozaar  Smoking cessation education provided Nicotine patch     Code Status: full  Family Communication: patient  And husband /so at bedside  Disposition Plan: likely d/c in am, will need home 02   Consultants:  none  Procedures:  none  Antibiotics:  doxycycline    Objective: BP (!) 125/59   Pulse 68   Temp 98.1 F (36.7 C) (Oral)   Resp 18   Ht 5\' 7"  (1.702 m)   Wt 63.4 kg   SpO2 93%   BMI 21.90 kg/m   Intake/Output Summary (Last 24 hours) at 09/15/2018 1115 Last data filed at 09/15/2018 0733 Gross per 24 hour  Intake 680 ml  Output 1900 ml  Net -1220 ml   Filed Weights   09/13/18 0530 09/14/18 0549 09/15/18 0311  Weight: 63.9 kg 63.5 kg 63.4 kg     Exam: Patient is examined daily including today on 09/15/2018, exams remain the same as of yesterday except that has changed    General:  NAD  Cardiovascular: RRR  Respiratory: prolonged expiratory phase, less bilateral wheezing   Abdomen: Soft/ND/NT, positive BS  Musculoskeletal: No Edema  Neuro: alert, oriented   Data Reviewed: Basic Metabolic Panel: Recent Labs  Lab 09/10/18 1245 09/11/18 0554 09/13/18 0617 09/15/18 0605  NA 135 136 136 136  K 3.9 4.2 4.5 4.6  CL 99 96* 95* 98  CO2 26 31 31  32  GLUCOSE 122* 95 100* 127*  BUN 9 7* 17 22  CREATININE 0.70 0.59 0.70 0.67  CALCIUM 8.8* 8.5* 9.0 8.8*  MG  --   --  2.1  --    Liver Function Tests: Recent Labs  Lab 09/15/18 0605  AST 22  ALT 28  ALKPHOS 76  BILITOT 0.5  PROT 6.5  ALBUMIN 3.4*   No results for input(s): LIPASE, AMYLASE in the last 168 hours. No results for input(s): AMMONIA in the last 168 hours. CBC: Recent Labs  Lab 09/10/18 1245 09/11/18 0805 09/13/18 0617 09/15/18 0605  WBC 6.3 5.7 8.9 7.8  NEUTROABS  --   --  7.8* 6.9  HGB 15.0 13.5 14.3 14.4  HCT 47.4* 43.4 43.9 45.7  MCV 96.7 96.7 97.1 97.0  PLT 184 175 228 217   Cardiac Enzymes:   No results for input(s): CKTOTAL, CKMB, CKMBINDEX, TROPONINI in the last 168 hours. BNP (last  3 results) No results for input(s): BNP in the last 8760 hours.  ProBNP (last 3 results) No results for input(s): PROBNP in the last 8760 hours.  CBG: No results for input(s): GLUCAP in the last 168 hours.  No results found for this or any previous visit (from the past 240 hour(s)).   Studies: No results found.  Scheduled Meds: . aspirin EC  81 mg Oral Daily  . doxycycline  100 mg Oral Q12H  . enoxaparin (LOVENOX) injection  40 mg Subcutaneous Q24H  . fluticasone  2 spray Each Nare Daily  . guaiFENesin  600 mg Oral BID  . ipratropium-albuterol  3 mL Nebulization BID  . losartan  50 mg Oral Daily  . methylPREDNISolone (SOLU-MEDROL)  injection  60 mg Intravenous Q12H  . nicotine  21 mg Transdermal Daily  . [START ON 09/16/2018] predniSONE  60 mg Oral Q breakfast  . simvastatin  20 mg Oral q1800  . sodium chloride flush  3 mL Intravenous Q12H    Continuous Infusions: . sodium chloride       Time spent: 15mins I have personally reviewed and interpreted on  09/15/2018 daily labs, imagings as discussed above under date review session and assessment and plans.  I reviewed all nursing notes, pharmacy notes,  vitals, pertinent old records  I have discussed plan of care as described above with RN , patient and husband/son  on 09/15/2018   Florencia Reasons MD, PhD  [facility] Pager [phone]. If 7PM-7AM, please contact night-coverage at [email or web address], password [facility] 09/15/2018, 11:15 AM  LOS: 5 days

## 2018-09-16 MED ORDER — PREDNISONE 10 MG PO TABS: ORAL_TABLET | ORAL | 0 refills | Status: DC

## 2018-09-16 MED ORDER — GUAIFENESIN ER 600 MG PO TB12: 600.0000 mg | ORAL_TABLET | Freq: Two times a day (BID) | ORAL | 0 refills | Status: DC

## 2018-09-16 MED ORDER — FLUTICASONE PROPIONATE 50 MCG/ACT NA SUSP: 2.0000 | Freq: Every day | NASAL | 0 refills | Status: DC

## 2018-09-16 MED ORDER — DOXYCYCLINE HYCLATE 100 MG PO TABS: 100.0000 mg | ORAL_TABLET | Freq: Two times a day (BID) | ORAL | 0 refills | Status: AC

## 2018-09-16 NOTE — Progress Notes (Signed)
Pt has orders to be discharged. Discharge instructions given and pt has no additional questions at this time. Medication regimen reviewed and pt educated. Pt verbalized understanding and has no additional questions. Telemetry box removed. IV removed and site in good condition. Pt stable and waiting for transportation. 

## 2018-09-16 NOTE — Progress Notes (Signed)
SATURATION QUALIFICATIONS:    Patient Saturations on Room Air at Rest = 96%  Patient Saturations on Room Air while Ambulating = 84%  Patient Saturations on 2 Liters of oxygen while Ambulating = 92%

## 2018-09-16 NOTE — Progress Notes (Signed)
Patient is for discharge home today with home Oxygen; Dan with [facility] called and he will deliver a portable tank to the room today prior to discharge and more oxygen tanks will be delivered to the home. Mindi Slicker [facility] [phone]

## 2018-09-16 NOTE — Care Management Important Message (Signed)
Important Message  Patient Details  Name: Tiffany [name] MRN: [id number] Date of Birth: 05-Nov-1946   Medicare Important Message Given:  Yes     P  09/16/2018, 1:08 PM

## 2018-09-16 NOTE — Discharge Summary (Signed)
Discharge Summary  Tiffany [name] [id number]:[id number] DOB: 08-17-1946  PCP: Tiffany Ohara, MD  Admit date: 09/10/2018 Discharge date: 09/16/2018  Time spent: 36mins  Recommendations for Outpatient Follow-up:  1. F/u with PMD within a week  for hospital discharge follow up, repeat cbc/bmp at follow up 2. Home o2 arranged  Discharge Diagnoses:  Active Hospital Problems   Diagnosis Date Noted  . Acute on chronic respiratory failure with hypoxia ([place]) 09/10/2018  . Dyspnea 09/10/2018  . COPD with acute exacerbation ([place]) 09/10/2018  . Atherosclerosis of right coronary artery 06/03/2018  . Essential hypertension 12/31/2016  . Osteopenia 05/12/2012  . Vitamin D deficiency 04/28/2012  . Pure hypercholesterolemia 04/28/2012  . Tobacco use disorder 04/03/2012    Resolved Hospital Problems  No resolved problems to display.    Discharge Condition: stable  Diet recommendation: regular diet  Filed Weights   09/14/18 0549 09/15/18 0311 09/16/18 0609  Weight: 63.5 kg 63.4 kg 63.2 kg    History of present illness: (per admitting MD Dr Steffanie Dunn)  PCP: Tiffany Ohara, MD Consultants:  none Patient coming from: home- lives with husband  Chief Complaint: Shortness of breath  HPI: Tiffany [name] is a 72 y.o. female with medical history significant for COPD not on home O2, HTN, hyperlipidemia, tobacco use, osteopenia, MGUS, who presented to urgent care today complaining of 5 days of progressive shortness of breath. Her symptoms started out like a cold, with a sore throat, and progressed to dyspnea and wheezing which prompted her urgent care visit today. Her cough is productive of green sputum. She has never been on home O2 in the past. She has not been hospitalized in around a decade. She denies fevers, chills. No sick contacts or recent travel. Had emesis this morning after orange juice but has not had any nausea per se. No diarrhea.    ED Course: She was 80% on RA, started on 2L [place], given  albuterol nebs x 2, atrovent neb, and 60 mg prednisone. She was taken off RA but O2 sats quickly dropped again to 86% so she was put back on 2L. Remainder of VS were WNL. No significant lab abnormalities, CXR neg for acute.  Hospital Course:  Principal Problem:   Acute on chronic respiratory failure with hypoxia (HCC) Active Problems:   Tobacco use disorder   Vitamin D deficiency   Pure hypercholesterolemia   Osteopenia   Essential hypertension   Atherosclerosis of right coronary artery   Dyspnea   COPD with acute exacerbation (HCC)   Acute hypoxic resp failure 2/2 COPD exacerbation.  -She was 80% on RA on presentation -Moderate/severe exacerbation as evidenced by increased dyspnea, hypoxia and increased sputum, diffuse bilateral wheezing on presentation -flu pcr negative, cxr no acute infiltrate, review previous screening chest CT does has emphysema changes -urine strep pneumo antigen negative --improving, wheezing has almost resolved at discharge, but o2 dropped when ambulating on room air, she is discharged home on steroids taper, doxycycline ,prn albuterol She is to follow up with pcp,  pcp to Refer patient  to pulmonology to have PFT  CAD -cont baby ASA, cozaar, zocor  Essential HTN -cont cozaar  Smoking cessation education provided Nicotine patch     Code Status:full  Family Communication:patient And husband /so at bedside  Disposition Plan: d/c home with home 02   Consultants:  none  Procedures:  none  Antibiotics:  doxycycline   Discharge Exam: BP (!) 143/68   Pulse 68   Temp 98.1 F (36.7 C) (Oral)  Resp 18   Ht 5\' 7"  (1.702 m)   Wt 63.2 kg   SpO2 91%   BMI 21.83 kg/m    General:  NAD  Cardiovascular: RRR  Respiratory: overall diminished, with prolonged expiratory phase, diffuse bilateral wheezing  Has largely resolved  Abdomen: Soft/ND/NT, positive BS  Musculoskeletal: No Edema  Neuro: alert, oriented     Discharge Instructions You were cared for by a hospitalist during your hospital stay. If you have any questions about your discharge medications or the care you received while you were in the hospital after you are discharged, you can call the unit and asked to speak with the hospitalist on call if the hospitalist that took care of you is not available. Once you are discharged, your primary care physician will handle any further medical issues. Please note that NO REFILLS for any discharge medications will be authorized once you are discharged, as it is imperative that you return to your primary care physician (or establish a relationship with a primary care physician if you do not have one) for your aftercare needs so that they can reassess your need for medications and monitor your lab values.  Discharge Instructions    Diet general   Complete by:  As directed    Increase activity slowly   Complete by:  As directed      Allergies as of 09/16/2018   No Known Allergies     Medication List    STOP taking these medications   meloxicam 15 MG tablet Commonly known as:  MOBIC     TAKE these medications   albuterol 108 (90 Base) MCG/ACT inhaler Commonly known as:  PROVENTIL HFA;VENTOLIN HFA Inhale 2 puffs into the lungs every 6 (six) hours as needed for wheezing or shortness of breath. What changed:  Another medication with the same name was removed. Continue taking this medication, and follow the directions you see here.   aspirin EC 81 MG tablet Take 1 tablet (81 mg total) by mouth daily.   CALTRATE 600 PLUS-VIT D PO Take 1 each by mouth daily.   doxycycline 100 MG tablet Commonly known as:  VIBRA-TABS Take 1 tablet (100 mg total) by mouth every 12 (twelve) hours for 3 days.   fluticasone 50 MCG/ACT nasal spray Commonly known as:  FLONASE Place 2 sprays into both nostrils daily. Start taking on:  09/17/2018   guaiFENesin 600 MG 12 hr tablet Commonly known as:   MUCINEX Take 1 tablet (600 mg total) by mouth 2 (two) times daily.   losartan 50 MG tablet Commonly known as:  COZAAR Take 1 tablet (50 mg total) by mouth daily.   predniSONE 10 MG tablet Commonly known as:  DELTASONE Label  & dispense according to the schedule below. 5 Pills PO on day one then, 4Pills PO on day two, 3 Pills PO on day three, 2Pills PO on day four, 1 Pill PO on day five,  then STOP.  Total of 15 tabs   simvastatin 20 MG tablet Commonly known as:  ZOCOR TAKE 1 TABLET BY MOUTH EVERYDAY AT BEDTIME   umeclidinium-vilanterol 62.5-25 MCG/INH Aepb Commonly known as:  ANORO ELLIPTA Inhale 1 puff into the lungs daily.            Durable Medical Equipment  (From admission, onward)         Start     Ordered   09/16/18 1008  DME Oxygen  Once    Question Answer Comment  Mode or (Route)  Nasal cannula   Liters per Minute 2   Frequency Continuous (stationary and portable oxygen unit needed)   Oxygen conserving device Yes   Oxygen delivery system Gas      09/16/18 1007         No Known Allergies Follow-up Information    Magdalen Spatz, NP Follow up in 3 week(s).   Specialty:  Pulmonary Disease Why:  copd management Contact information: [address] [place] [address] [phone]        Knapp, Eve, MD Follow up in 1 week(s).   Specialty:  Family Medicine Why:  hospital discharge follow up Contact information: [place] [address] [phone]            The results of significant diagnostics from this hospitalization (including imaging, microbiology, ancillary and laboratory) are listed below for reference.    Significant Diagnostic Studies: Dg Chest 2 View  Result Date: 09/10/2018 CLINICAL DATA:  Cough, shortness of breath. EXAM: CHEST - 2 VIEW COMPARISON:  Radiographs of August 30, 2014. FINDINGS: The heart size and mediastinal contours are within normal limits. No pneumothorax or pleural effusion is  noted. Stable biapical scarring is noted. No acute pulmonary disease is noted. The visualized skeletal structures are unremarkable. IMPRESSION: No active cardiopulmonary disease. Electronically Signed   By: Marijo Conception, M.D.   On: 09/10/2018 13:27    Microbiology: No results found for this or any previous visit (from the past 240 hour(s)).   Labs: Basic Metabolic Panel: Recent Labs  Lab 09/10/18 1245 09/11/18 0554 09/13/18 0617 09/15/18 0605  NA 135 136 136 136  K 3.9 4.2 4.5 4.6  CL 99 96* 95* 98  CO2 26 31 31  32  GLUCOSE 122* 95 100* 127*  BUN 9 7* 17 22  CREATININE 0.70 0.59 0.70 0.67  CALCIUM 8.8* 8.5* 9.0 8.8*  MG  --   --  2.1  --    Liver Function Tests: Recent Labs  Lab 09/15/18 0605  AST 22  ALT 28  ALKPHOS 76  BILITOT 0.5  PROT 6.5  ALBUMIN 3.4*   No results for input(s): LIPASE, AMYLASE in the last 168 hours. No results for input(s): AMMONIA in the last 168 hours. CBC: Recent Labs  Lab 09/10/18 1245 09/11/18 0805 09/13/18 0617 09/15/18 0605  WBC 6.3 5.7 8.9 7.8  NEUTROABS  --   --  7.8* 6.9  HGB 15.0 13.5 14.3 14.4  HCT 47.4* 43.4 43.9 45.7  MCV 96.7 96.7 97.1 97.0  PLT 184 175 228 217   Cardiac Enzymes: No results for input(s): CKTOTAL, CKMB, CKMBINDEX, TROPONINI in the last 168 hours. BNP: BNP (last 3 results) No results for input(s): BNP in the last 8760 hours.  ProBNP (last 3 results) No results for input(s): PROBNP in the last 8760 hours.  CBG: No results for input(s): GLUCAP in the last 168 hours.     Signed:  Florencia Reasons MD, PhD  [facility] 09/16/2018, 10:07 AM

## 2018-09-17 DIAGNOSIS — J441 Chronic obstructive pulmonary disease with (acute) exacerbation: Secondary | ICD-10-CM | POA: Diagnosis not present

## 2018-09-21 NOTE — Progress Notes (Signed)
Chief Complaint  Patient presents with  . Hospitalization Follow-up    follow up. Still having some dizziness from time to time.     Patient presents for hospital follow-up. She is accompanied by her husband today. She was hospitalized 10/16-10/22 with COPD exacerbation. She had 5 days of progressive shortness of breath, productive cough, was hypoxic with sats of 80% on RA at time of presentation.  She was discharged on 10/22 to complete course of doxycyline and steroid taper. She was supposed to remain on Anoro (but she never actually took this or ever got it filled).  She was discharged with home oxygen, as her sats dropped with ambulation in the hospital.  She is using this regularly at home (though left it in the car today). She is due for recheck of CBC, b-met and for referral to pulmonary for full PFT/consult/eval, per hospitalist discharge summary.   Patient states she called herself, and is scheduled to see pulm clinic on Wednesday.  She never started Anoro as was prescribed by us at her last visit in July--reports that when she went to the pharmacy after her last visit, it wasn't covered. She thought she called and told us this.  She hasn't used any albuterol since she has been home.  She is wearing oxygen at home, even to sleep. She isn't using it in the shower, but is taking cooler showers (not as hot), and she feels much less short of breath after bathing compared to when she was still smoking.  She reports having some lightheadedness since being home.  Took BP this morning, it was low so didn't take her losartan.  Her husband also noted low BP when he checked his BP, changed the battery in the monitor and it came up on recheck. Her BP wasn't rechecked. Doesn't feel dizzy like she could pass out, just slightly lightheaded, even now.  She feels like she has been eating/drinking okay.  Smoking: Treated with nicotine patch in hospital. She still wanted a cigarette, didn't think it helped much.   She hasn't smoked since discharge .Her husband also quit smoking, which helps.  PMH, PSH, SH reviewed  Outpatient Encounter Medications as of 09/22/2018  Medication Sig Note  . fluticasone (FLONASE) 50 MCG/ACT nasal spray Place 2 sprays into both nostrils daily.   . losartan (COZAAR) 50 MG tablet Take 1 tablet (50 mg total) by mouth daily. 09/22/2018: Didn't take today due to low bp this am  . simvastatin (ZOCOR) 20 MG tablet TAKE 1 TABLET BY MOUTH EVERYDAY AT BEDTIME   . albuterol (PROVENTIL HFA;VENTOLIN HFA) 108 (90 Base) MCG/ACT inhaler Inhale 2 puffs into the lungs every 6 (six) hours as needed for wheezing or shortness of breath. (Patient not taking: Reported on 09/22/2018)   . aspirin EC 81 MG tablet Take 1 tablet (81 mg total) by mouth daily.   . Calcium-Vitamin D (CALTRATE 600 PLUS-VIT D PO) Take 1 each by mouth daily.   . guaiFENesin (MUCINEX) 600 MG 12 hr tablet Take 1 tablet (600 mg total) by mouth 2 (two) times daily. (Patient not taking: Reported on 09/22/2018)   . umeclidinium-vilanterol (ANORO ELLIPTA) 62.5-25 MCG/INH AEPB Inhale 1 puff into the lungs daily. (Patient not taking: Reported on 04/17/2018) 04/17/2018: Pt states insurance wouldn't cover  . [DISCONTINUED] predniSONE (DELTASONE) 10 MG tablet Label  & dispense according to the schedule below. 5 Pills PO on day one then, 4Pills PO on day two, 3 Pills PO on day three, 2Pills PO on day   four, 1 Pill PO on day five,  then STOP.  Total of 15 tabs    No facility-administered encounter medications on file as of 09/22/2018.    No Known Allergies  ROS: No fever, chills, nausea, vomiting, diarrhea.  Some decreased appetite, things don't taste the same.  Breathing is at baseline, hasn't had any shortness of breath, because she hasn't "done anything".  Less short of breath while showering since she has been home. No bleeding, bruising, rash. Moods are good.   PHYSICAL EXAM:  BP 132/60   Pulse 68   Ht 5' 5.5" (1.664 m)   Wt 143  lb (64.9 kg)   SpO2 95%   BMI 23.43 kg/m   Wt Readings from Last 3 Encounters:  09/22/18 143 lb (64.9 kg)  09/16/18 139 lb 6.4 oz (63.2 kg)  06/04/18 142 lb 9.6 oz (64.7 kg)   Well appearing, pleasant female, in good spirits, in no distress HEENT: conjunctiva and sclera are clear, EOMI, OP clear Neck: no lymphadenopathy, thyromegaly or mass Heart: regular rate and rhythm Lungs: no wheezes, rales, ronchi, somewhat distant. Abdomen: soft, nontender, no organomegaly or mass Extremities: no edema, normal pulses, no clubbing or cyanosis Skin: normal turgor, no rashes Psych: normal mood, affect, hygiene and grooming Neuro: alert and oriented, cranial nerves intact, normal gait.   ASSESSMENT/PLAN:  Acute on chronic respiratory failure with hypoxia (HCC) - requiring home oxygen. Improved s/p course of doxy and steroids. back to baseline - Plan: Basic metabolic panel, CBC with Differential/Platelet  Chronic obstructive pulmonary disease, unspecified COPD type ([place]) - Never got Anoro filled due to cost. At this point, will wait for med changes when sees pulm in 2d. Congratulated on smoking cessation, counseled.  Essential hypertension - well controlled; cont/resume losartan. Ensure drinking adequate water/fluids. f/u if persistent dizziness, or low BP's  Tobacco use - hasn't smoked since hospitalizatoin; now has oxygen at home, reviewed risks. Answered questions about nicotine gum, counseled re: cessation.    Be sure to drink plenty of water. Continue to monitor your blood pressure at home. If it is consistently low (now that the battery has been changed), let us know before you make any changes to your medication. Restart the losartan today.

## 2018-09-22 ENCOUNTER — Encounter: Payer: Self-pay | Admitting: Family Medicine

## 2018-09-22 ENCOUNTER — Ambulatory Visit: Payer: Medicare Other | Admitting: Family Medicine

## 2018-09-22 VITALS — BP 132/60 | HR 68 | Ht 65.5 in | Wt 143.0 lb

## 2018-09-22 DIAGNOSIS — Z720 Tobacco use: Secondary | ICD-10-CM | POA: Diagnosis not present

## 2018-09-22 DIAGNOSIS — J449 Chronic obstructive pulmonary disease, unspecified: Secondary | ICD-10-CM | POA: Diagnosis not present

## 2018-09-22 DIAGNOSIS — I10 Essential (primary) hypertension: Secondary | ICD-10-CM | POA: Diagnosis not present

## 2018-09-22 DIAGNOSIS — J9621 Acute and chronic respiratory failure with hypoxia: Secondary | ICD-10-CM | POA: Diagnosis not present

## 2018-09-22 NOTE — Patient Instructions (Signed)
Throw away the rest of your cigarettes. Contact [email or web address] or [email or web address] for free resources, counseling.  You have never taken Anoro, which was supposed to be taken daily to help your emphysema.  I will let the pulmonologist decide with you on Wednesday which medication should be started, given issues with cost. It is very important that you communicate to them if the prescribed medication is not covered or affordable, so that a substitution can be made.

## 2018-09-23 LAB — BASIC METABOLIC PANEL
BUN/Creatinine Ratio: 24 (ref 12–28)
BUN: 19 mg/dL (ref 8–27)
CHLORIDE: 95 mmol/L — AB (ref 96–106)
CO2: 29 mmol/L (ref 20–29)
Calcium: 8.7 mg/dL (ref 8.7–10.3)
Creatinine, Ser: 0.79 mg/dL (ref 0.57–1.00)
GFR calc non Af Amer: 76 mL/min/{1.73_m2} (ref >59)
GFR, EST AFRICAN AMERICAN: 87 mL/min/{1.73_m2} (ref >59)
GLUCOSE: 121 mg/dL — AB (ref 65–99)
POTASSIUM: 4.2 mmol/L (ref 3.5–5.2)
Sodium: 136 mmol/L (ref 134–144)

## 2018-09-23 LAB — CBC WITH DIFFERENTIAL/PLATELET
BASOS ABS: 0 10*3/uL (ref 0.0–0.2)
Basos: 0 %
EOS (ABSOLUTE): 0.1 10*3/uL (ref 0.0–0.4)
Eos: 1 %
Hematocrit: 42.6 % (ref 34.0–46.6)
Hemoglobin: 14.8 g/dL (ref 11.1–15.9)
IMMATURE GRANS (ABS): 0 10*3/uL (ref 0.0–0.1)
Immature Granulocytes: 0 %
Lymphocytes Absolute: 2.6 10*3/uL (ref 0.7–3.1)
Lymphs: 26 %
MCH: 31.3 pg (ref 26.6–33.0)
MCHC: 34.7 g/dL (ref 31.5–35.7)
MCV: 90 fL (ref 79–97)
MONOCYTES: 9 %
Monocytes Absolute: 0.9 10*3/uL (ref 0.1–0.9)
NEUTROS ABS: 6.3 10*3/uL (ref 1.4–7.0)
Neutrophils: 64 %
PLATELETS: 224 10*3/uL (ref 150–450)
RBC: 4.73 x10E6/uL (ref 3.77–5.28)
RDW: 11.8 % — ABNORMAL LOW (ref 12.3–15.4)
WBC: 10 10*3/uL (ref 3.4–10.8)

## 2018-09-24 ENCOUNTER — Encounter: Payer: Self-pay | Admitting: Pulmonary Disease

## 2018-09-24 ENCOUNTER — Ambulatory Visit (INDEPENDENT_AMBULATORY_CARE_PROVIDER_SITE_OTHER): Payer: Medicare Other | Admitting: Pulmonary Disease

## 2018-09-24 VITALS — BP 132/68 | HR 80 | Ht 67.0 in | Wt 141.4 lb

## 2018-09-24 DIAGNOSIS — J449 Chronic obstructive pulmonary disease, unspecified: Secondary | ICD-10-CM

## 2018-09-24 DIAGNOSIS — Z720 Tobacco use: Secondary | ICD-10-CM

## 2018-09-24 DIAGNOSIS — J9601 Acute respiratory failure with hypoxia: Secondary | ICD-10-CM | POA: Diagnosis not present

## 2018-09-24 MED ORDER — UMECLIDINIUM-VILANTEROL 62.5-25 MCG/INH IN AEPB: 1.0000 | INHALATION_SPRAY | Freq: Every day | RESPIRATORY_TRACT | 0 refills | Status: DC

## 2018-09-24 MED ORDER — ALBUTEROL SULFATE HFA 108 (90 BASE) MCG/ACT IN AERS: 2.0000 | INHALATION_SPRAY | Freq: Four times a day (QID) | RESPIRATORY_TRACT | 1 refills | Status: DC | PRN

## 2018-09-24 MED ORDER — UMECLIDINIUM-VILANTEROL 62.5-25 MCG/INH IN AEPB: 1.0000 | INHALATION_SPRAY | Freq: Every day | RESPIRATORY_TRACT | 5 refills | Status: DC

## 2018-09-24 NOTE — Progress Notes (Signed)
Synopsis: Referred in 08/2018 for severe COPD  Subjective:   [name] GENDER: female DOB: 10-04-1946, MRN: [id number]   HPI  Chief Complaint  Patient presents with  . CONSULT    went to ED for dyspnea 09/10/18 - admitted.  Feeling better now but has had increased breathing trouble for about 1 year. after hosp started on home O2 at 2L at night [facility])  Tiffany [name] is a 72 year old female with severe COPD who presents as a new consultation. Husband is present with patient and provides history.  PCP note 09/22/2018 (reviewed and summarized): Seen for hospital follow-up admission from 10/16-10/22 for COPD exacerbation.  Treated with doxycycline and steroid taper.  Discharged with a Anoro however did not ever fill this prescription.  Also discharged with oxygen.  Since hospital discharge, her severe dyspnea and wheezing have resolved. When she was ill, she had productive cough associated with pleuritic burning chest pain.  After treatment with antibiotics and steroids she feels overall improved.  Today, she reports being able to ambulate around the store without any issues.  She does continue to have dyspnea on moderate to severe exertion associated with occasional cough and wheezing. Smoking will aggravate her symptoms.  Has not smoked since discharge area rest and albuterol improves her symptoms.  Uses albuterol 1-2 times a week which is improved compared to her daily use. Has only been hospitalized once for COPD exacerbation. Denies any other ED/urgent care visits.  Never previously needed inhalers.  Feels her activity has diminished over the years due to her breathing but has not yet limited her independence.  Denies fevers, chills, unintentional weight loss.  50 pack years. Quit 08/2018  I have personally reviewed patient's past medical/family/social history, allergies, current medications.  Past Medical History:  Diagnosis Date  . Abnormal PFT 09/02/14   moderate restrictive ventilatory defect.  Marland Kitchen BCC (basal cell carcinoma), face 2010   Dr. Allyson Sabal  . Colon polyp 2/05  . Diverticulosis 11/2010   seen on colonoscopy and air contrast BE  . Emphysema of lung ([place])   . Hyperlipidemia   . Hypertension    previously treated with meds, resolved  . MGUS (monoclonal gammopathy of unknown significance) 1999   prevously under care of Dr. Learta Codding  . Osteopenia    [facility]/31/[facility]; 2014 at [facility]  . 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 Tobacco abuse      Family History  Problem Relation Age of Onset  . Stroke Mother   . Cerebral aneurysm Mother   . Hypertension Father   . Suicidality Father   . Diabetes Maternal Aunt   . Diabetes Maternal Aunt   . Diabetes Maternal Aunt   . Throat cancer Maternal Uncle   . Breast cancer Maternal Aunt        2 aunts  . Lung cancer Maternal Uncle      Social History   Socioeconomic History  . Marital status: Married    Spouse name: Not on file  . Number of children: 1  . Years of education: Not on file  . Highest education level: Not on file  Occupational History  . Occupation: retired Manufacturing systems engineer)    Employer: RETIRED  Social Needs  . Financial resource strain: Not on file  . Food insecurity:    Worry: Not on file    Inability: Not on file  . Transportation needs:    Medical:  Not on file    Non-medical: Not on file  Tobacco Use  . Smoking status: Former Smoker    Packs/day: 1.50    Years: 50.00    Pack years: 75.00    Types: Cigarettes    Last attempt to quit: 09/10/2018    Years since quitting: 0.0  . Smokeless tobacco: Never Used  Substance and Sexual Activity  . Alcohol use: Yes    Alcohol/week: 0.0 standard drinks    Comment: maybe 1-2 times per year. (rare beer in the summer)  . Drug use: No  . Sexual activity: Yes    Partners: Male  Lifestyle  . Physical activity:    Days per week: Not on file    Minutes per session: Not on  file  . Stress: Not on file  Relationships  . Social connections:    Talks on phone: Not on file    Gets together: Not on file    Attends religious service: Not on file    Active member of club or organization: Not on file    Attends meetings of clubs or organizations: Not on file    Relationship status: Not on file  . Intimate partner violence:    Fear of current or ex partner: Not on file    Emotionally abused: Not on file    Physically abused: Not on file    Forced sexual activity: Not on file  Other Topics Concern  . Not on file  Social History Narrative   Lives with husband, 1 cat.  Son lives in [place]. 1 grandson      No Known Allergies   Outpatient Medications Prior to Visit  Medication Sig Dispense Refill  . aspirin EC 81 MG tablet Take 1 tablet (81 mg total) by mouth daily.    . Calcium-Vitamin D (CALTRATE 600 PLUS-VIT D PO) Take 1 each by mouth daily.    . fluticasone (FLONASE) 50 MCG/ACT nasal spray Place 2 sprays into both nostrils daily. 16 g 0  . losartan (COZAAR) 50 MG tablet Take 1 tablet (50 mg total) by mouth daily. 90 tablet 0  . simvastatin (ZOCOR) 20 MG tablet TAKE 1 TABLET BY MOUTH EVERYDAY AT BEDTIME 90 tablet 1  . albuterol (PROVENTIL HFA;VENTOLIN HFA) 108 (90 Base) MCG/ACT inhaler Inhale 2 puffs into the lungs every 6 (six) hours as needed for wheezing or shortness of breath. 18 g 1  . guaiFENesin (MUCINEX) 600 MG 12 hr tablet Take 1 tablet (600 mg total) by mouth 2 (two) times daily. (Patient not taking: Reported on 09/24/2018) 60 tablet 0  . umeclidinium-vilanterol (ANORO ELLIPTA) 62.5-25 MCG/INH AEPB Inhale 1 puff into the lungs daily. (Patient not taking: Reported on 09/24/2018) 60 each 5   No facility-administered medications prior to visit.     Review of Systems  Constitutional: Positive for malaise/fatigue. Negative for chills, diaphoresis, fever and weight loss.  HENT: Negative for congestion and sore throat.   Respiratory: Positive for cough,  shortness of breath and wheezing. Negative for hemoptysis and sputum production.   Cardiovascular: Negative for chest pain, leg swelling and PND.  Gastrointestinal: Positive for heartburn. Negative for abdominal pain and nausea.  Genitourinary: Negative for frequency.  Musculoskeletal: Negative for joint pain.  Skin: Negative for rash.  Neurological: Positive for dizziness. Negative for weakness and headaches.  Endo/Heme/Allergies: Does not bruise/bleed easily.  Psychiatric/Behavioral: Negative for depression. The patient is not nervous/anxious.   All other systems reviewed and are negative.     Objective:  Physical Exam  Constitutional: She is oriented to person, place, and time. She appears well-developed and well-nourished. No distress.  HENT:  Head: Normocephalic and atraumatic.  Nose: Nose normal.  Mouth/Throat: No oropharyngeal exudate.  Eyes: Conjunctivae and EOM are normal. No scleral icterus.  Neck: Normal range of motion. Neck supple. No JVD present. No tracheal deviation present.  Cardiovascular: Normal rate, regular rhythm, normal heart sounds and intact distal pulses. Exam reveals no gallop and no friction rub.  No murmur heard. Pulmonary/Chest: Effort normal. No stridor. No respiratory distress. She has no wheezes. She has no rales.  Decreased breath sounds bilaterally  Abdominal: Soft. Bowel sounds are normal. She exhibits no distension. There is no tenderness.  Musculoskeletal: Normal range of motion. She exhibits no edema or deformity.  Neurological: She is alert and oriented to person, place, and time. No cranial nerve deficit or sensory deficit.  Skin: Skin is warm and dry. No rash noted. She is not diaphoretic. No erythema. No pallor.  Psychiatric: She has a normal mood and affect. Her behavior is normal. Judgment and thought content normal.  Vitals reviewed.    Vitals:   09/24/18 0907  BP: 132/68  Pulse: 80  SpO2: 96%  Weight: 141 lb 6.4 oz (64.1 kg)    Height: 5\' 7"  (1.702 m)    CBC    Component Value Date/Time   WBC 10.0 09/22/2018 1450   WBC 7.8 09/15/2018 0605   RBC 4.73 09/22/2018 1450   RBC 4.71 09/15/2018 0605   HGB 14.8 09/22/2018 1450   HCT 42.6 09/22/2018 1450   PLT 224 09/22/2018 1450   MCV 90 09/22/2018 1450   MCH 31.3 09/22/2018 1450   MCH 30.6 09/15/2018 0605   MCHC 34.7 09/22/2018 1450   MCHC 31.5 09/15/2018 0605   RDW 11.8 (L) 09/22/2018 1450   LYMPHSABS 2.6 09/22/2018 1450   MONOABS 0.2 09/15/2018 0605   EOSABS 0.1 09/22/2018 1450   BASOSABS 0.0 09/22/2018 1450     Chest imaging:  PFT 12/02/17: Severe obstructive defect Ratio 58 FEV1 37 FVC 48  I have personally reviewed the above labs, images and tests noted above.    Assessment & Plan:   COPD, severe ([place]) - Plan: umeclidinium-vilanterol (ANORO ELLIPTA) 62.5-25 MCG/INH AEPB  Acute on chronic respiratory failure with hypoxemia (HCC)  Tobacco abuse  Chronic obstructive pulmonary disease, unspecified COPD type ([place]) - severe; continues to smoke, only using prn albuterol 2-3 times/week due to limited activity. Needs daily meds, await insurance preferences (pt to call) - Plan: albuterol (PROVENTIL HFA;VENTOLIN HFA) 108 (90 Base) MCG/ACT inhaler  Discussion: 72 year old female with severe COPD and acute hypoxemic respiratory failure.  Recently admitted for COPD exacerbation.  Walking desaturation test demonstrates no supplemental oxygen required.  Will initiate LABA/LAMA for GOLD Class B/C  Severe COPD START Anoro Ellipta 1 inhalation daily FOR SHORTNESS OF BREATH AND/OR WHEEZING, use albuterol every 4-6 hours as needed for shortness of breath or wheezing. --CT Chest Lung Screen Ordered. Expires 03/10/19. Will discuss at next visit.  Acute respiratory failure - resolved Walking test performed in-clinic demonstrates oxygen is no longer needed. Supplemental oxygen recommended when oxygen levels <88%  Tobacco abuse Patient recently quit. We discussed  smoking cessation for <3 minutes. We discussed triggers and stressors and ways to deal with them. We discussed barriers to continued smoking and benefits of smoking cessation.   Follow-up in 3 months  Vaccinations - UTD Pneumovax 2016 Influenza 2019  Thank you for choosing [facility] for your  health needs!   Chi Rodman Pickle, MD [facility] 09/24/2018 11:17 AM  Personal pager: [phone] If unanswered, please page CCM On-call: [phone]    Current Outpatient Medications:  .  albuterol (PROVENTIL HFA;VENTOLIN HFA) 108 (90 Base) MCG/ACT inhaler, Inhale 2 puffs into the lungs every 6 (six) hours as needed for wheezing or shortness of breath., Disp: 18 g, Rfl: 1 .  aspirin EC 81 MG tablet, Take 1 tablet (81 mg total) by mouth daily., Disp: , Rfl:  .  Calcium-Vitamin D (CALTRATE 600 PLUS-VIT D PO), Take 1 each by mouth daily., Disp: , Rfl:  .  fluticasone (FLONASE) 50 MCG/ACT nasal spray, Place 2 sprays into both nostrils daily., Disp: 16 g, Rfl: 0 .  losartan (COZAAR) 50 MG tablet, Take 1 tablet (50 mg total) by mouth daily., Disp: 90 tablet, Rfl: 0 .  simvastatin (ZOCOR) 20 MG tablet, TAKE 1 TABLET BY MOUTH EVERYDAY AT BEDTIME, Disp: 90 tablet, Rfl: 1 .  guaiFENesin (MUCINEX) 600 MG 12 hr tablet, Take 1 tablet (600 mg total) by mouth 2 (two) times daily. (Patient not taking: Reported on 09/24/2018), Disp: 60 tablet, Rfl: 0 .  umeclidinium-vilanterol (ANORO ELLIPTA) 62.5-25 MCG/INH AEPB, Inhale 1 puff into the lungs daily., Disp: 60 each, Rfl: 5 .  umeclidinium-vilanterol (ANORO ELLIPTA) 62.5-25 MCG/INH AEPB, Inhale 1 puff into the lungs daily., Disp: 1 each, Rfl: 0

## 2018-09-24 NOTE — Patient Instructions (Addendum)
COPD START Anoro Ellipta 1 inhalation daily FOR SHORTNESS OF BREATH AND/OR WHEEZING, use albuterol every 4-6 hours as needed for shortness of breath or wheezing.  If you experience worsening shortness of breath or wheezing that does not improve, please seek medical attention immediately.  Oxygen-Dependence Walking test demonstrates oxygen is no longer needed. Supplemental oxygen is only needed when oxygen levels <88%  Follow-up in 3 months   Chronic Obstructive Pulmonary Disease Chronic obstructive pulmonary disease (COPD) is a long-term (chronic) condition that affects the lungs. COPD is a general term that can be used to describe many different lung problems that cause lung swelling (inflammation) and limit airflow, including chronic bronchitis and emphysema. If you have COPD, your lung function will probably never return to normal. In most cases, it gets worse over time. However, there are steps you can take to slow the progression of the disease and improve your quality of life. What are the causes? This condition may be caused by:  Smoking. This is the most common cause.  Certain genes passed down through families.  What increases the risk? The following factors may make you more likely to develop this condition:  Secondhand smoke from cigarettes, pipes, or cigars.  Exposure to chemicals and other irritants such as fumes and dust in the work environment.  Chronic lung conditions or infections.  What are the signs or symptoms? Symptoms of this condition include:  Shortness of breath, especially during physical activity.  Chronic cough with a large amount of thick mucus. Sometimes the cough may not have any mucus (dry cough).  Wheezing.  Rapid breaths.  Gray or bluish discoloration (cyanosis) of the skin, especially in your fingers, toes, or lips.  Feeling tired (fatigue).  Weight loss.  Chest tightness.  Frequent infections.  Episodes when breathing symptoms  become much worse (exacerbations).  Swelling in the ankles, feet, or legs. This may occur in later stages of the disease.  How is this diagnosed? This condition is diagnosed based on:  Your medical history.  A physical exam.  You may also have tests, including:  Lung (pulmonary) function tests. This may include a spirometry test, which measures your ability to exhale properly.  Chest X-ray.  CT scan.  Blood tests.  How is this treated? This condition may be treated with:  Medicines. These may include inhaled rescue medicines to treat acute exacerbations as well as long-term, or maintenance, medicines to prevent flare-ups of COPD. ? Bronchodilators help treat COPD by dilating the airways to allow increased airflow and make your breathing more comfortable. ? Steroids can reduce airway inflammation and help prevent exacerbations.  Smoking cessation. If you smoke, your health care provider may ask you to quit, and may also recommend therapy or replacement products to help you quit.  Pulmonary rehabilitation. This may involve working with a team of health care providers and specialists, such as respiratory, occupational, and physical therapists.  Exercise and physical activity. These are beneficial for nearly all people with COPD.  Nutrition therapy to gain weight, if you are underweight.  Oxygen. Supplemental oxygen therapy is only helpful if you have a low oxygen level in your blood (hypoxemia).  Lung surgery or transplant.  Palliative care. This is to help people with COPD feel comfortable when treatment is no longer working.  Follow these instructions at home: Medicines  Take over-the-counter and prescription medicines (inhaled or pills) only as told by your health care provider.  Talk to your health care provider before taking any cough or  allergy medicines. You may need to avoid certain medicines that dry out your airways. Lifestyle  If you are a smoker, the most  important thing that you can do is to stop smoking. Do not use any products that contain nicotine or tobacco, such as cigarettes and e-cigarettes. If you need help quitting, ask your health care provider. Continuing to smoke will cause the disease to progress faster.  Avoid exposure to things that irritate your lungs, such as smoke, chemicals, and fumes.  Stay active, but balance activity with periods of rest. Exercise and physical activity will help you maintain your ability to do things you want to do.  Learn and use relaxation techniques to manage stress and to control your breathing.  Get the right amount of sleep and get quality sleep. Most adults need 7 or more hours per night.  Eat healthy foods. Eating smaller, more frequent meals and resting before meals may help you maintain your strength. Controlled breathing Learn and use controlled breathing techniques as directed by your health care provider. Controlled breathing techniques include:  Pursed lip breathing. Start by breathing in (inhaling) through your nose for 1 second. Then, purse your lips as if you were going to whistle and breathe out (exhale) through the pursed lips for 2 seconds.  Diaphragmatic breathing. Start by putting one hand on your abdomen just above your waist. Inhale slowly through your nose. The hand on your abdomen should move out. Then purse your lips and exhale slowly. You should be able to feel the hand on your abdomen moving in as you exhale.  Controlled coughing Learn and use controlled coughing to clear mucus from your lungs. Controlled coughing is a series of short, progressive coughs. The steps of controlled coughing are: 1. Lean your head slightly forward. 2. Breathe in deeply using diaphragmatic breathing. 3. Try to hold your breath for 3 seconds. 4. Keep your mouth slightly open while coughing twice. 5. Spit any mucus out into a tissue. 6. Rest and repeat the steps once or twice as needed.  General  instructions  Make sure you receive all the vaccines that your health care provider recommends, especially the pneumococcal and influenza vaccines. Preventing infection and hospitalization is very important when you have COPD.  Use oxygen therapy and pulmonary rehabilitation if directed to by your health care provider. If you require home oxygen therapy, ask your health care provider whether you should purchase a pulse oximeter to measure your oxygen level at home.  Work with your health care provider to develop a COPD action plan. This will help you know what steps to take if your condition gets worse.  Keep other chronic health conditions under control as told by your health care provider.  Avoid extreme temperature and humidity changes.  Avoid contact with people who have an illness that spreads from person to person (is contagious), such as viral infections or pneumonia.  Keep all follow-up visits as told by your health care provider. This is important. Contact a health care provider if:  You are coughing up more mucus than usual.  There is a change in the color or thickness of your mucus.  Your breathing is more labored than usual.  Your breathing is faster than usual.  You have difficulty sleeping.  You need to use your rescue medicines or inhalers more often than expected.  You have trouble doing routine activities such as getting dressed or walking around the house. Get help right away if:  You have shortness of  breath while you are resting.  You have shortness of breath that prevents you from: ? Being able to talk. ? Performing your usual physical activities.  You have chest pain lasting longer than 5 minutes.  Your skin color is more blue (cyanotic) than usual.  You measure low oxygen saturations for longer than 5 minutes with a pulse oximeter.  You have a fever.  You feel too tired to breathe normally. Summary  Chronic obstructive pulmonary disease (COPD)  is a long-term (chronic) condition that affects the lungs.  Your lung function will probably never return to normal. In most cases, it gets worse over time. However, there are steps you can take to slow the progression of the disease and improve your quality of life.  Treatment for COPD may include taking medicines, quitting smoking, pulmonary rehabilitation, and changes to diet and exercise. As the disease progresses, you may need oxygen therapy, a lung transplant, or palliative care.  To help manage your condition, do not smoke, avoid exposure to things that irritate your lungs, stay up to date on all vaccines, and follow your health care provider's instructions for taking medicines. This information is not intended to replace advice given to you by your health care provider. Make sure you discuss any questions you have with your health care provider. Document Released: 08/22/2005 Document Revised: 12/17/2016 Document Reviewed: 12/17/2016 Elsevier Interactive Patient Education  [employer].

## 2018-10-18 DIAGNOSIS — J441 Chronic obstructive pulmonary disease with (acute) exacerbation: Secondary | ICD-10-CM | POA: Diagnosis not present

## 2018-11-17 DIAGNOSIS — J441 Chronic obstructive pulmonary disease with (acute) exacerbation: Secondary | ICD-10-CM | POA: Diagnosis not present

## 2018-11-29 ENCOUNTER — Other Ambulatory Visit: Payer: Self-pay | Admitting: Family Medicine

## 2018-11-29 DIAGNOSIS — E7800 Pure hypercholesterolemia, unspecified: Secondary | ICD-10-CM

## 2018-12-01 ENCOUNTER — Other Ambulatory Visit: Payer: Self-pay | Admitting: *Deleted

## 2018-12-01 DIAGNOSIS — I10 Essential (primary) hypertension: Secondary | ICD-10-CM

## 2018-12-01 MED ORDER — LOSARTAN POTASSIUM 50 MG PO TABS: 50.0000 mg | ORAL_TABLET | Freq: Every day | ORAL | 0 refills | Status: DC

## 2018-12-02 NOTE — Patient Instructions (Addendum)
HEALTH MAINTENANCE RECOMMENDATIONS:  It is recommended that you get at least 30 minutes of aerobic exercise at least 5 days/week (for weight loss, you may need as much as 60-90 minutes). This can be any activity that gets your heart rate up. This can be divided in 10-15 minute intervals if needed, but try and build up your endurance at least once a week.  Weight bearing exercise is also recommended twice weekly.  Eat a healthy diet with lots of vegetables, fruits and fiber.  "Colorful" foods have a lot of vitamins (ie green vegetables, tomatoes, red peppers, etc).  Limit sweet tea, regular sodas and alcoholic beverages, all of which has a lot of calories and sugar.  Up to 1 alcoholic drink daily may be beneficial for women (unless trying to lose weight, watch sugars).  Drink a lot of water.  Calcium recommendations are 1200-1500 mg daily (1500 mg for postmenopausal women or women without ovaries), and vitamin D 1000 IU daily.  This should be obtained from diet and/or supplements (vitamins), and calcium should not be taken all at once, but in divided doses.  Monthly self breast exams and yearly mammograms for women over the age of 76 is recommended.  Sunscreen of at least SPF 30 should be used on all sun-exposed parts of the skin when outside between the hours of 10 am and 4 pm (not just when at beach or pool, but even with exercise, golf, tennis, and yard work!)  Use a sunscreen that says "broad spectrum" so it covers both UVA and UVB rays, and make sure to reapply every 1-2 hours.  Remember to change the batteries in your smoke detectors when changing your clock times in the spring and fall.  Use your seat belt every time you are in a car, and please drive safely and not be distracted with cell phones and texting while driving.   Tiffany [name] , Thank you for taking time to come for your Medicare Wellness Visit. I appreciate your ongoing commitment to your health goals. Please review the following  plan we discussed and let me know if I can assist you in the future.    This is a list of the screening recommended for you and due dates:  Health Maintenance  Topic Date Due  . Tetanus Vaccine  12/13/2019  . Mammogram  03/07/2020  . Colon Cancer Screening  11/28/2020  . Flu Shot  Completed  . DEXA scan (bone density measurement)  Completed  .  Hepatitis C: One time screening is recommended by Center for Disease Control  (CDC) for  adults born from [birth year] through 1964.   Completed  . Pneumonia vaccines  Completed   Yearly mammograms are recommended (due again 02/2019, NOT 2021 as stated above) Next bone density test is due 02/2020  I recommend getting the new shingles vaccine (Shingrix). You will need to check with your insurance to see if it is covered, and if covered by Medicare Part D, you need to get from the pharmacy rather than our office.  It is a series of 2 injections, spaced 2 months apart.  Routine dental visits for screening for oral cancers are recommended.  Follow up with the pulmonary clinic in February as scheduled. You will also be due then for repeat CT scanning for screening for lung cancer.   Please check your dose of Vitamin D3 that you have at home.  We are checking your level today--I suspect it will be low, since you haven't been taking  it regularly, and was low when taking 1000 IU in the past. I recommend that you take 2000 IU every day (take it with your blood pressure medication).  We will contact you with your blood test results if you need something stronger. Even if you need a prescription, 2000 IU daily will be the long-term recommendation.  Please contact your pulmonary doctor about the fact that your Anoro isn't covered and that you aren't using it, and let them know which medication IS covered, to see if that is an option.    You are likely past due for follow-up colonoscopy (since they sent you a letter a couple of years ago stating you were due). Since you  will not consider alternatives (Cologuard) it is important that you contact [facility] to set up another visit and schedule colonoscopy. Since they had you on a 5 year follow-up, you likely had polyps that put you at increased risk for getting more polyps, which can be precancerous.  We discussed the need for 150 minutes of aerobic exercise each week--being done 10-15 minutes 2-3 times/day if you can, until you build up where you can exercise for longer.  Consider asking your lung doctor about pulmonary rehab as an option.

## 2018-12-02 NOTE — Progress Notes (Signed)
Chief Complaint  Patient presents with  . Medicare Wellness    Tiffany [name] is a 73 y.o. female who presents for annual wellness visit and follow-up on chronic medical conditions.    Hypertension. She is compliant with taking losartan '50mg'$  (some issues recently with backorder, needs to check back with pharmacy today).BP's have been running 120's-130/60's70's. She denies side effects. Denies headaches, chest pain, palpitations, edema, shortness of breath (other than from COPD)  Hyperlipidemia: She is compliant with taking simvastatin. Denies side effect. Tries to follow lowfat, low cholesterol diet She was noted to have aortic atherosclerosis on CT for lung cancer screening. Other cardiac risk factors include HTN, smoking, which is why statin was started. She declined the stress test previously recommended by cardiologist, Dr. Saunders Revel.  She has chronic DOE, which may also be related to her COPD. This has improved since she quit smoking. Calcified atherosclerotic plaque was noted in R coronary artery on CT scan (incidentally noted on lung CA screen) 12/2017. Lab Results  Component Value Date   CHOL 160 06/04/2018   HDL 56 06/04/2018   LDLCALC 84 06/04/2018   TRIG 100 06/04/2018   CHOLHDL 2.9 06/04/2018    Osteopenia--She previously took bisphosphonates (changed from fosamax (due to nausea) to Actonel, as well as Boniva, both of which she tolerated but stopped due to cost issues, possibly some GI side effects (she stopped without contacting us).  She takes a calcium +D supplement daily. She doesn't get any regular weight-bearing exercise. She recently quit smoking.  DEXA was rechecked in 02/2018, which didn't show any progression/worsening. 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 We did not recommend restarting medication, but encouraged Ca, D (has h/o deficiency), tobacco cessation (which she recently did) and  weight-bearing exercise, and to recheck in 2 years.  Vitamin D deficiency. Last level was 28.2 in 05/2018, had been 25 in 11/2017. At that time, she was taking 1000 IU of vitamin D3. She had been advised to increase her D3 dose to 2000 IU daily.  She is currently taking a pill (unsure of dose) only very sporadically (2-3 times/week).  Tobacco use and COPD--She quit smoking 09/10/18.  Her husband has also quit. Her shortness of breath is much better since she quit, but still gets "a little out of breath"  going up and down the stairs to do her laundry, and in carrying her groceries in (up a hill to her house). This is much better.  Spirometry in 11/2017 showed severe airway obstruction, with low vital capacity. She was hospitalized with COPD exacerbation in October 2019.  She was sent home on oxygen. She saw pulmonary in follow-up, at which time the oxygen was no longer needed, and stopped.  She was told to take the prescribed Anoro, and is scheduled to follow up with them in early February 2020. She is using albuterol every morning, and often at bedtime. She has not been taking Anoro, not covered by her insurance (she did not contact pulmonary about this). Had lung cancer screening CT in February 2019, due for repeat next month.  H/o elevated TSH--TSH of 4.75 in 10/2016, with normal free T4; recheck in June 2018 was stable (4.44). TSH was subsequently normal. She deniesany changes in hair/skin/bowels/energy/temperatureintolerance. She gained some weight since quitting smoking (chocolate from Christmas, and a lot of "suckers".  Notes some hair loss, nothing new recently, just with age. Lab Results  Component Value  Date   TSH 2.74 12/02/2017     Immunization History  Administered Date(s) Administered  . Influenza Split 10/16/2012  . Influenza, High Dose Seasonal PF 09/02/2013, 08/25/2014, 08/31/2015, 11/01/2016, 09/09/2017, 09/09/2018  . Pneumococcal Conjugate-13 08/25/2014  . Pneumococcal  Polysaccharide-23 12/12/2009, 08/31/2015  . Tdap 12/12/2009   Last Pap smear: s/p hyst in 20's  Last mammogram: 02/2018 Last colonoscopy: 11/2010 Dr. Wynetta Emery. She got a letter a couple of years ago stating she was due, but didn't go. Last DEXA: 02/2018 Dentist: doesn't go, has dentures  Ophtho: last year, saw specialist for "wrinkled retina" Exercise: Walks around [place] and the mall for shopping/exercise (no sustained aerobic activity, no weight-bearing exercise).  Other doctors caring for patient include: Dermatologist: Dr. Ledell Peoples PA GI: Dr. Wynetta Emery [facility]) Ophtho: Dr. Wynetta Emery ophtho (specialist): Dr. Edilia Bo Pulmonary: Dr. Loanne Drilling  Depression screen negative PHQ-2 Fall screen negative Functional status survey unremarkable.  Mini-Cog screen normal. See full screens in Epic  End of Life Discussion: Patient does not havea living will and medical power of attorney. She still has paperwork given last year  Past Medical History:  Diagnosis Date  . Abnormal PFT 09/02/14   moderate restrictive ventilatory defect.  Marland Kitchen BCC (basal cell carcinoma), face 2010   Dr. Allyson Sabal  . Colon polyp 2/05  . Diverticulosis 11/2010   seen on colonoscopy and air contrast BE  . Emphysema of lung ([place])   . Hyperlipidemia   . Hypertension    previously treated with meds, resolved  . MGUS (monoclonal gammopathy of unknown significance) 1999   prevously under care of Dr. Learta Codding  . Osteopenia    [facility]/31/[facility]  . 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 Tobacco abuse     Past Surgical History:  Procedure Laterality Date  . BASAL CELL CARCINOMA EXCISION  2010   L face, R temple  . BILATERAL SALPINGOOPHORECTOMY  2007   cancerous tumor R ovary  . CATARACT EXTRACTION  2009   bilateral  . COLONOSCOPY  11/2010   Dr. Wynetta Emery. Diverticulosis. BE also done--normal  . OMENTECTOMY  2007   and pelvic lymphadenectomy (with BSO)  . TONSILLECTOMY   age 69  . VAGINAL HYSTERECTOMY  late 20's    Social History   Socioeconomic History  . Marital status: Married    Spouse name: Not on file  . Number of children: 1  . Years of education: Not on file  . Highest education level: Not on file  Occupational History  . Occupation: retired Manufacturing systems engineer)    Employer: RETIRED  Social Needs  . Financial resource strain: Not on file  . Food insecurity:    Worry: Not on file    Inability: Not on file  . Transportation needs:    Medical: Not on file    Non-medical: Not on file  Tobacco Use  . Smoking status: Former Smoker    Packs/day: 1.50    Years: 50.00    Pack years: 75.00    Types: Cigarettes    Last attempt to quit: 09/10/2018    Years since quitting: 0.2  . Smokeless tobacco: Never Used  Substance and Sexual Activity  . Alcohol use: Yes    Alcohol/week: 0.0 standard drinks    Comment: maybe 1-2 times per year. (rare beer in the summer)  . Drug use: No  . Sexual activity: Yes    Partners: Male  Lifestyle  . Physical activity:  Days per week: Not on file    Minutes per session: Not on file  . Stress: Not on file  Relationships  . Social connections:    Talks on phone: Not on file    Gets together: Not on file    Attends religious service: Not on file    Active member of club or organization: Not on file    Attends meetings of clubs or organizations: Not on file    Relationship status: Not on file  . Intimate partner violence:    Fear of current or ex partner: Not on file    Emotionally abused: Not on file    Physically abused: Not on file    Forced sexual activity: Not on file  Other Topics Concern  . Not on file  Social History Narrative   Lives with husband, 1 cat.  Son separated from his wife, now lives with her also (used to live in [place]). 1 grandson     Family History  Problem Relation Age of Onset  . Stroke Mother   . Cerebral aneurysm Mother   . Hypertension Father   . Suicidality  Father   . Diabetes Maternal Aunt   . Diabetes Maternal Aunt   . Diabetes Maternal Aunt   . Throat cancer Maternal Uncle   . Breast cancer Maternal Aunt        2 aunts  . Lung cancer Maternal Uncle     Outpatient Encounter Medications as of 12/03/2018  Medication Sig Note  . albuterol (PROVENTIL HFA;VENTOLIN HFA) 108 (90 Base) MCG/ACT inhaler Inhale 2 puffs into the lungs every 6 (six) hours as needed for wheezing or shortness of breath. 12/03/2018: Uses once daily, sometimes also at bedtime  . aspirin EC 81 MG tablet Take 1 tablet (81 mg total) by mouth daily.   . Calcium-Vitamin D (CALTRATE 600 PLUS-VIT D PO) Take 1 each by mouth daily.   Marland Kitchen losartan (COZAAR) 50 MG tablet Take 1 tablet (50 mg total) by mouth daily.   . simvastatin (ZOCOR) 20 MG tablet TAKE 1 TABLET BY MOUTH EVERYDAY AT BEDTIME   . fluticasone (FLONASE) 50 MCG/ACT nasal spray Place 2 sprays into both nostrils daily. (Patient not taking: Reported on 12/03/2018) 12/03/2018: Uses prn, not recently  . guaiFENesin (MUCINEX) 600 MG 12 hr tablet Take 1 tablet (600 mg total) by mouth 2 (two) times daily. (Patient not taking: Reported on 09/24/2018) 12/03/2018: Used earlier in the week when she had a sore throat.  Uses prn  . umeclidinium-vilanterol (ANORO ELLIPTA) 62.5-25 MCG/INH AEPB Inhale 1 puff into the lungs daily. (Patient not taking: Reported on 12/03/2018) 12/03/2018: Not covered by insurance.  Only used what he was sent home with, not currently taking  . [DISCONTINUED] umeclidinium-vilanterol (ANORO ELLIPTA) 62.5-25 MCG/INH AEPB Inhale 1 puff into the lungs daily.    No facility-administered encounter medications on file as of 12/03/2018.     No Known Allergies   ROS: The patient denies anorexia, fever, headaches, vision changes, decreased hearing, ear pain, sore throat, breast concerns, chest pain, dizziness, syncope, swelling, nausea, vomiting, diarrhea, constipation, abdominal pain, melena, hematochezia, hematuria, incontinence,  dysuria, vaginal bleeding, discharge, odor or itch, genital lesions, joint pains (just intermittent L knee pain), weakness, tremor, suspicious skin lesions, depression, anxiety, abnormal bleeding/bruising, or enlarged lymph nodes. Occasional night sweats, no hot flashes during the day  Some vaginal dryness  SOB only with stairs and carrying groceries uphill, overall much improved. Some smoker's cough -- thick, clear phlegm. This  is much less frequent since she quit smoking. Has heartburn with certain foods, such as pizza. Sore throat earlier this week, took mucinex x 2 d, resolved. +weight gain since October Derm f/u is scheduled soon for a full skin check   PHYSICAL EXAM:  BP 130/68   Pulse 72   Temp 98.2 F (36.8 C) (Oral)   Ht '5\' 7"'$  (1.702 m)   Wt 151 lb (68.5 kg)   SpO2 97%   BMI 23.65 kg/m   Wt Readings from Last 3 Encounters:  12/03/18 151 lb (68.5 kg)  09/24/18 141 lb 6.4 oz (64.1 kg)  09/22/18 143 lb (64.9 kg)     General Appearance:  Alert, cooperative, no distress, appears stated age. Speaks easily in full sentences, in no distress  Head:  Normocephalic, without obvious abnormality, atraumatic   Eyes:  PERRL, conjunctiva/corneas clear, EOM's intact, fundi benign   Ears:  Normal TM's and external ear canals   Nose:  Nares normal, mucosa normal, clear mucus noted on the left; no sinus tenderness   Throat:  Lips, visualized mucosa, and tongue normal; upper and lower dentures in place, limiting full visibility of oropharynx  Neck:  Supple, no lymphadenopathy; thyroid: no enlargement/ tenderness/nodules; no carotid bruit or JVD   Back:  Spine nontender, no curvature, ROM normal, no CVA tenderness   Lungs:  Diminished air movement with both inspiratory and expiratory wheezes noted.  No prolonged expiratory phase or any evidence of distress. No rales, ronchi.  Chest Wall:  No tenderness or deformity   Heart:  Regular rate and rhythm, S1 and S2 normal,  no murmur, rub or gallop   Breast Exam:  No masses or nipple discharge or inversion. No axillary lymphadenopathy. Slightly more fibroglandular changes are noted on the left breast, which is also mildly tender  Abdomen:  Soft, non-tender, nondistended, normoactive bowel sounds, no masses, no hepatosplenomegaly   Genitalia:  Normal external genitalia without lesions. BUS and vagina normal, some atrophic changes; No abnormal vaginal discharge. Uterus absent. No adnexal masses or tenderness. Pap not performed   Rectal:  Normal tone, no masses or tenderness; guaiac negative stool   Extremities:  No clubbing, cyanosis or edema   Pulses:  Slightly diminished, symmetric all extremities Feet are warm ,brisk cap refill  Skin:  Turgor normal, no rashes. Significant actinic changes to skin. SK's and a few AK's on back. Ecchymoses and purpura on forearms and LE's (bandage and large bruise right shin), thin skin. Very dry skin on lower legs. Dry patch at right wrist (smaller and less red than in the past)  Lymph nodes:  Cervical, supraclavicular, and axillary nodes normal   Neurologic:  CNII-XII intact, normal strength, sensation and gait; reflexes 2+ and symmetric throughout    Psych:  Normal mood, affect, hygiene and grooming    ASSESSMENT/PLAN:  Annual physical exam - Plan: POCT Urinalysis DIP (Proadvantage Device), Comprehensive metabolic panel, TSH, VITAMIN D 25 Hydroxy (Vit-D Deficiency, Fractures)  Medicare annual wellness visit, subsequent  Osteopenia, unspecified location - encouraged weight-bearing exercise, Ca and D intake. DEXA f/u due 2021  Atherosclerosis of right coronary artery  Aortic atherosclerosis (HCC)  Vitamin D deficiency - noncompliant with supplements, suspect will be low again - Plan: VITAMIN D 25 Hydroxy (Vit-D Deficiency, Fractures), Vitamin D, Ergocalciferol, (DRISDOL) 1.25 MG (50000 UT) CAPS capsule  Pure hypercholesterolemia  Essential  hypertension - controlled - Plan: Comprehensive metabolic panel  Chronic obstructive pulmonary disease, unspecified COPD type ([place]) - advised to contact pulm re:  recommended meds not being covered. f/u as scheduled next month. exercise program encouraged  Chronic respiratory failure with hypoxia ([place]) - improved since quitting smoking, no longer requires oxygen  Senile purpura (HCC)  Abnormal TSH - Plan: TSH  Medication monitoring encounter - Plan: Comprehensive metabolic panel, VITAMIN D 25 Hydroxy (Vit-D Deficiency, Fractures)   c-met, TSH, D  Discussed monthly self breast exams and yearly mammograms; at least 30 minutes of aerobic activity at least 5 days/week, weight-bearing exercise 2x/wk; proper sunscreen use reviewed; healthy diet, including goals of calcium and vitamin D intake and alcohol recommendations (less than or equal to 1 drink/day) reviewed; regular seatbelt use; changing batteries in smoke detectors. Immunization recommendations discussed-- Shingrix recommended, to get from pharmacy. Risks/SE reviewed. Colonoscopy recommendations reviewed, past due. She is NOT interested in [place], would rather have a repeat colonoscopy; admits she never called to schedule appointment.  Full Code, Full care. Doesn't want prolonged measures. Discussed importance/reasons for completing Living Will and healthcare POA, encouraged to fill out and give [place] a copy.   Recommended seeing dentist for oral cancer screening exam   Please check your dose of Vitamin D3 that you have at home.  We are checking your level today--I suspect it will be low, since you haven't been taking it regularly, and was low when taking 1000 IU in the past. I recommend that you take 2000 IU every day (take it with your blood pressure medication).  We will contact you with your blood test results if you need something stronger. Even if you need a prescription, 2000 IU daily will be the long-term  recommendation.  Please contact your pulmonary doctor about the fact that your Anoro isn't covered and that you aren't using it, and let them know which medication IS covered, to see if that is an option.    You are likely past due for follow-up colonoscopy (since they sent you a letter a couple of years ago stating you were due). Since you will not consider alternatives (Cologuard) it is important that you contact [facility] to set up another visit and schedule colonoscopy. Since they had you on a 5 year follow-up, you likely had polyps that put you at increased risk for getting more polyps, which can be precancerous.  We discussed the need for 150 minutes of aerobic exercise each week--being done 10-15 minutes 2-3 times/day if you can, until you build up where you can exercise for longer.  Consider asking your lung doctor about pulmonary rehab as an option.   Medicare Attestation I have personally reviewed: The patient's medical and social history Their use of alcohol, tobacco or illicit drugs Their current medications and supplements The patient's functional ability including ADLs,fall risks, home safety risks, cognitive, and hearing and visual impairment Diet and physical activities Evidence for depression or mood disorders  The patient's weight, height and BMI have been recorded in the chart.  I have made referrals, counseling, and provided education to the patient based on review of the above and I have provided the patient with a written personalized care plan for preventive services.

## 2018-12-03 ENCOUNTER — Ambulatory Visit: Payer: Medicare Other | Admitting: Family Medicine

## 2018-12-03 ENCOUNTER — Encounter: Payer: Self-pay | Admitting: Family Medicine

## 2018-12-03 VITALS — BP 130/68 | HR 72 | Temp 98.2°F | Ht 67.0 in | Wt 151.0 lb

## 2018-12-03 DIAGNOSIS — Z5181 Encounter for therapeutic drug level monitoring: Secondary | ICD-10-CM

## 2018-12-03 DIAGNOSIS — I10 Essential (primary) hypertension: Secondary | ICD-10-CM | POA: Diagnosis not present

## 2018-12-03 DIAGNOSIS — Z0000 Encounter for general adult medical examination without abnormal findings: Secondary | ICD-10-CM

## 2018-12-03 DIAGNOSIS — R7989 Other specified abnormal findings of blood chemistry: Secondary | ICD-10-CM

## 2018-12-03 DIAGNOSIS — I700 Atherosclerosis of aorta: Secondary | ICD-10-CM | POA: Diagnosis not present

## 2018-12-03 DIAGNOSIS — J449 Chronic obstructive pulmonary disease, unspecified: Secondary | ICD-10-CM

## 2018-12-03 DIAGNOSIS — M8580 Other specified disorders of bone density and structure, unspecified site: Secondary | ICD-10-CM | POA: Diagnosis not present

## 2018-12-03 DIAGNOSIS — J9611 Chronic respiratory failure with hypoxia: Secondary | ICD-10-CM

## 2018-12-03 DIAGNOSIS — D692 Other nonthrombocytopenic purpura: Secondary | ICD-10-CM

## 2018-12-03 DIAGNOSIS — I2510 Atherosclerotic heart disease of native coronary artery without angina pectoris: Secondary | ICD-10-CM

## 2018-12-03 DIAGNOSIS — E7800 Pure hypercholesterolemia, unspecified: Secondary | ICD-10-CM

## 2018-12-03 DIAGNOSIS — E559 Vitamin D deficiency, unspecified: Secondary | ICD-10-CM

## 2018-12-03 LAB — POCT URINALYSIS DIP (PROADVANTAGE DEVICE)
Bilirubin, UA: NEGATIVE
Blood, UA: NEGATIVE
Glucose, UA: NEGATIVE mg/dL
Ketones, POC UA: NEGATIVE mg/dL
Leukocytes, UA: NEGATIVE
Nitrite, UA: NEGATIVE
Protein Ur, POC: NEGATIVE mg/dL
Specific Gravity, Urine: 1.015
Urobilinogen, Ur: NEGATIVE
pH, UA: 6

## 2018-12-04 LAB — COMPREHENSIVE METABOLIC PANEL
ALBUMIN: 4.6 g/dL (ref 3.5–4.8)
ALK PHOS: 107 IU/L (ref 39–117)
ALT: 18 IU/L (ref 0–32)
AST: 22 IU/L (ref 0–40)
Albumin/Globulin Ratio: 1.6 (ref 1.2–2.2)
BILIRUBIN TOTAL: 0.5 mg/dL (ref 0.0–1.2)
BUN / CREAT RATIO: 15 (ref 12–28)
BUN: 13 mg/dL (ref 8–27)
CHLORIDE: 97 mmol/L (ref 96–106)
CO2: 25 mmol/L (ref 20–29)
Calcium: 9.4 mg/dL (ref 8.7–10.3)
Creatinine, Ser: 0.89 mg/dL (ref 0.57–1.00)
GFR calc Af Amer: 75 mL/min/{1.73_m2} (ref >59)
GFR calc non Af Amer: 65 mL/min/{1.73_m2} (ref >59)
GLUCOSE: 95 mg/dL (ref 65–99)
Globulin, Total: 2.8 g/dL (ref 1.5–4.5)
Potassium: 4.8 mmol/L (ref 3.5–5.2)
SODIUM: 138 mmol/L (ref 134–144)
Total Protein: 7.4 g/dL (ref 6.0–8.5)

## 2018-12-04 LAB — TSH: TSH: 3.7 u[IU]/mL (ref 0.450–4.500)

## 2018-12-04 LAB — VITAMIN D 25 HYDROXY (VIT D DEFICIENCY, FRACTURES): VIT D 25 HYDROXY: 24.6 ng/mL — AB (ref 30.0–100.0)

## 2018-12-04 MED ORDER — VITAMIN D (ERGOCALCIFEROL) 1.25 MG (50000 UNIT) PO CAPS: 50000.0000 [IU] | ORAL_CAPSULE | ORAL | 0 refills | Status: DC

## 2018-12-18 DIAGNOSIS — J441 Chronic obstructive pulmonary disease with (acute) exacerbation: Secondary | ICD-10-CM | POA: Diagnosis not present

## 2018-12-24 ENCOUNTER — Other Ambulatory Visit: Payer: Self-pay | Admitting: Family Medicine

## 2018-12-24 DIAGNOSIS — E559 Vitamin D deficiency, unspecified: Secondary | ICD-10-CM

## 2018-12-29 DIAGNOSIS — L570 Actinic keratosis: Secondary | ICD-10-CM | POA: Diagnosis not present

## 2018-12-29 DIAGNOSIS — D225 Melanocytic nevi of trunk: Secondary | ICD-10-CM | POA: Diagnosis not present

## 2018-12-29 DIAGNOSIS — L821 Other seborrheic keratosis: Secondary | ICD-10-CM | POA: Diagnosis not present

## 2018-12-29 DIAGNOSIS — C44212 Basal cell carcinoma of skin of right ear and external auricular canal: Secondary | ICD-10-CM | POA: Diagnosis not present

## 2018-12-29 DIAGNOSIS — L820 Inflamed seborrheic keratosis: Secondary | ICD-10-CM | POA: Diagnosis not present

## 2018-12-31 ENCOUNTER — Encounter: Payer: Self-pay | Admitting: Pulmonary Disease

## 2018-12-31 ENCOUNTER — Ambulatory Visit (INDEPENDENT_AMBULATORY_CARE_PROVIDER_SITE_OTHER): Payer: Medicare Other | Admitting: Pulmonary Disease

## 2018-12-31 ENCOUNTER — Telehealth: Payer: Self-pay | Admitting: Pulmonary Disease

## 2018-12-31 VITALS — BP 118/58 | HR 58 | Ht 67.0 in | Wt 149.0 lb

## 2018-12-31 DIAGNOSIS — J449 Chronic obstructive pulmonary disease, unspecified: Secondary | ICD-10-CM | POA: Diagnosis not present

## 2018-12-31 MED ORDER — TIOTROPIUM BROMIDE MONOHYDRATE 2.5 MCG/ACT IN AERS: 2.0000 | INHALATION_SPRAY | Freq: Every day | RESPIRATORY_TRACT | 0 refills | Status: DC

## 2018-12-31 NOTE — Progress Notes (Signed)
Subjective:   [name] GENDER: female DOB: 23-Jan-1946, MRN: [id number]   HPI  Chief Complaint  Patient presents with  . Follow-up    pt states having increased DOE, was in [place] 09/10/18, given Anoro, insurance won't pay for it   Ms. Tiffany [name] is a 73 year old female with severe COPD who presents for follow-up.  On her last clinic visit on 09/07/2018, she was seen for hospital follow-up for COPD exacerbation requiring antibiotics and steroids.  We refilled her Anoro inhaler however insurance does not cover it so she has not used it since our last visit. Previously used albuterol 1-2 times a week when Anoro, now using it 3-4 times a week especially when carrying groceries and laundry. Since we last met, she reports/denies ED/urgent care visits. After quitting smoking, she has seen a significant improvement in her cough. She monitors her oxygen and sats remain > 93% most of the time. Never below 88%.   Social History: Former smoker.  Quit 08/2018.  50 pack years  Environmental exposures: None  I have personally reviewed patient's past medical/family/social history, allergies, current medications.  Past Medical History:  Diagnosis Date  . Abnormal PFT 09/02/14   moderate restrictive ventilatory defect.  Tiffany [name] BCC (basal cell carcinoma), face 2010   Dr. Allyson Sabal  . Colon polyp 2/05  . Diverticulosis 11/2010   seen on colonoscopy and air contrast BE  . Emphysema of lung ([place])   . Hyperlipidemia   . Hypertension    previously treated with meds, resolved  . MGUS (monoclonal gammopathy of unknown significance) 1999   prevously under care of Dr. Learta Codding  . Osteopenia    DEXA 01/31/2010 at [facility]; 2014 at [facility]  . 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 Tobacco abuse      Family History  Problem Relation Age of Onset  . Stroke Mother   . Cerebral aneurysm Mother   . Hypertension Father   . Suicidality Father   . Diabetes  Maternal Aunt   . Diabetes Maternal Aunt   . Diabetes Maternal Aunt   . Throat cancer Maternal Uncle   . Breast cancer Maternal Aunt        2 aunts  . Lung cancer Maternal Uncle      Social History   Occupational History  . Occupation: retired Manufacturing systems engineer)    Employer: RETIRED  Tobacco Use  . Smoking status: Former Smoker    Packs/day: 1.50    Years: 50.00    Pack years: 75.00    Types: Cigarettes    Last attempt to quit: 09/10/2018    Years since quitting: 0.3  . Smokeless tobacco: Never Used  Substance and Sexual Activity  . Alcohol use: Yes    Alcohol/week: 0.0 standard drinks    Comment: maybe 1-2 times per year. (rare beer in the summer)  . Drug use: No  . Sexual activity: Yes    Partners: Male    No Known Allergies   Outpatient Medications Prior to Visit  Medication Sig Dispense Refill  . albuterol (PROVENTIL HFA;VENTOLIN HFA) 108 (90 Base) MCG/ACT inhaler Inhale 2 puffs into the lungs every 6 (six) hours as needed for wheezing or shortness of breath. 18 g 1  . aspirin EC 81 MG tablet Take 1 tablet (81 mg total) by mouth daily.    . Calcium-Vitamin D (CALTRATE 600 PLUS-VIT D PO) Take 1 each by mouth daily.    Tiffany [name]  fluticasone (FLONASE) 50 MCG/ACT nasal spray Place 2 sprays into both nostrils daily. 16 g 0  . losartan (COZAAR) 50 MG tablet Take 1 tablet (50 mg total) by mouth daily. 90 tablet 0  . simvastatin (ZOCOR) 20 MG tablet TAKE 1 TABLET BY MOUTH EVERYDAY AT BEDTIME 90 tablet 1  . Vitamin D, Ergocalciferol, (DRISDOL) 1.25 MG (50000 UT) CAPS capsule Take 1 capsule (50,000 Units total) by mouth every 7 (seven) days. 8 capsule 0  . guaiFENesin (MUCINEX) 600 MG 12 hr tablet Take 1 tablet (600 mg total) by mouth 2 (two) times daily. (Patient not taking: Reported on 09/24/2018) 60 tablet 0  . umeclidinium-vilanterol (ANORO ELLIPTA) 62.5-25 MCG/INH AEPB Inhale 1 puff into the lungs daily. (Patient not taking: Reported on 12/03/2018) 60 each 5   No  facility-administered medications prior to visit.     Review of Systems  Constitutional: Negative for chills, diaphoresis, fever, malaise/fatigue and weight loss.  HENT: Negative for congestion and sore throat.   Respiratory: Negative for cough, hemoptysis, sputum production, shortness of breath and wheezing.   Cardiovascular: Negative for chest pain, orthopnea, leg swelling and PND.  Gastrointestinal: Negative for abdominal pain, heartburn and nausea.  Genitourinary: Negative for frequency.  Musculoskeletal: Negative for myalgias.  Skin: Negative for rash.  Neurological: Negative for dizziness, weakness and headaches.  Endo/Heme/Allergies: Does not bruise/bleed easily.     Objective:   Vitals:   12/31/18 0900  BP: (!) 118/58  Pulse: (!) 58  SpO2: 97%  Weight: 149 lb (67.6 kg)  Height: '5\' 7"'$  (1.702 m)   SpO2: 97 % O2 Device: None (Room air)  Physical Exam General: Well-appearing, no acute distress HENT: [place], AT, OP clear, MMM Eyes: EOMI, no scleral icterus Respiratory: Clear to auscultation bilaterally.  No crackles, wheezing or rales Cardiovascular: RRR, -M/R/G, no JVD GI: BS+, soft, nontender Extremities:-Edema,-tenderness Neuro: AAO x4, CNII-XII grossly intact Skin: Intact, no rashes or bruising Psych: Normal mood, normal affect  Chest imaging: CXR 09/10/2018- hyperinflated lung fields.  No acute infiltrate/effusion or edema  PFT: 12/02/2017-severe obstructive defect. Ratio 58 FEV1 37 FVC 48  Imaging, labs and test noted above have been reviewed independently by me.    Assessment & Plan:   Severe COPD, GOLD Class B History of tobacco abuse Uncontrolled on SABA alone. --PROVIDE Spiriva Respimat 2.5 mcg 2 puffs daily --We will contact your insurance for approved alternative inhalers and will order 4 month supply  --CONTINUE Albuterol inhaler 2 puffs every 4 hours as needed for shortness of breath or wheezing --CT chest lung screen scheduled on  01/12/2019  Follow-up in 4 months  Vaccinations-UTD Pneumovax 2016 Influenza 2019   No orders of the defined types were placed in this encounter.  Meds ordered this encounter  Medications  . Tiotropium Bromide Monohydrate (SPIRIVA RESPIMAT) 2.5 MCG/ACT AERS    Sig: Inhale 2 puffs into the lungs daily.    Dispense:  2 Inhaler    Refill:  0    Return in about 4 months (around 05/01/2019).   Rodman Pickle, MD [facility] 12/31/2018 9:23 AM  Personal pager: [phone] If unanswered, please page CCM On-call: [phone]

## 2018-12-31 NOTE — Patient Instructions (Signed)
Severe COPD History of tobacco abuse PROVIDE Spiriva Respimat 2.5 mcg 2 puffs daily We will contact your insurance for approved alternative inhalers and will order 4 month supply  CONTINUE Albuterol inhaler 2 puffs every 4 hours as needed for shortness of breath or wheezing CT chest lung screen scheduled on 01/12/2019  Follow-up in 4 months

## 2018-12-31 NOTE — Progress Notes (Signed)
Patient seen in the office today and instructed on use of Spiriva 2.5.  Patient expressed understanding and demonstrated technique. 

## 2018-12-31 NOTE — Telephone Encounter (Signed)
Patient seen today by Dr Loanne Drilling She is unable to afford Anoro Per JE: please find covered alternatives  Called [facility] IS covered but patient has a high deductible plan.  When pt meets her deductible ($242 for the Anoro) then her copay will be $47.  Per JE: we gave samples of Spiriva Respimat in the office today.  Can we try Spiriva HH to see if the deductible portion for this med will be less?  Called [facility] - Rx for [facility] given over the phone.  Deductible portion for this device is $141, then $47 afterward.  Will call patient to discuss.

## 2019-01-01 NOTE — Telephone Encounter (Signed)
Called spoke with patient to discuss inhaler options.  Per patient, she is still unable to afford the $141 for the Spiriva HH.  Advised patient to continue the Spiriva Respimat samples given yesterday at the office visit, and to call us with an update when she is getting toward the end of her samples (pt given 1 month supply).  We can re-evaluate after speaking with patient.  Patient okay with this and voiced her understanding. Will route to Dr Loanne Drilling to make her aware.

## 2019-01-12 ENCOUNTER — Ambulatory Visit (INDEPENDENT_AMBULATORY_CARE_PROVIDER_SITE_OTHER)
Admission: RE | Admit: 2019-01-12 | Discharge: 2019-01-12 | Disposition: A | Discharge status: Home / Self Care | Payer: Medicare Other | Source: Ambulatory Visit | Attending: Acute Care | Admitting: Acute Care

## 2019-01-12 DIAGNOSIS — F17210 Nicotine dependence, cigarettes, uncomplicated: Secondary | ICD-10-CM

## 2019-01-12 DIAGNOSIS — Z122 Encounter for screening for malignant neoplasm of respiratory organs: Secondary | ICD-10-CM

## 2019-01-12 DIAGNOSIS — Z87891 Personal history of nicotine dependence: Secondary | ICD-10-CM | POA: Diagnosis not present

## 2019-01-15 ENCOUNTER — Other Ambulatory Visit: Payer: Self-pay | Admitting: Acute Care

## 2019-01-15 DIAGNOSIS — Z122 Encounter for screening for malignant neoplasm of respiratory organs: Secondary | ICD-10-CM

## 2019-01-15 DIAGNOSIS — Z87891 Personal history of nicotine dependence: Secondary | ICD-10-CM

## 2019-01-18 DIAGNOSIS — J441 Chronic obstructive pulmonary disease with (acute) exacerbation: Secondary | ICD-10-CM | POA: Diagnosis not present

## 2019-01-20 ENCOUNTER — Other Ambulatory Visit: Payer: Self-pay | Admitting: Family Medicine

## 2019-01-20 ENCOUNTER — Telehealth: Payer: Self-pay | Admitting: Family Medicine

## 2019-01-20 DIAGNOSIS — E559 Vitamin D deficiency, unspecified: Secondary | ICD-10-CM

## 2019-01-20 NOTE — Telephone Encounter (Signed)
Okay for order for them to pick up oxygen

## 2019-01-20 NOTE — Telephone Encounter (Signed)
Per visit in January, pt is suppose to take this for 8 week then take OTC

## 2019-01-20 NOTE — Telephone Encounter (Signed)
Pt called and said [facility] said she needed a work order from a physician to have her oxygen picked up. Pt said she has not used it in about 2 months and no longer needs it.

## 2019-01-21 NOTE — Telephone Encounter (Signed)
Order written and faxed to [phone].

## 2019-02-04 DIAGNOSIS — C44212 Basal cell carcinoma of skin of right ear and external auricular canal: Secondary | ICD-10-CM | POA: Diagnosis not present

## 2019-02-24 ENCOUNTER — Other Ambulatory Visit: Payer: Self-pay | Admitting: Family Medicine

## 2019-03-09 IMAGING — CT CT CHEST LUNG CANCER SCREENING LOW DOSE W/O CM
1 of 2 series · 10 of 20 positions shown, 13 images · non-contrast
Comparison: Plain film of 09/10/2018. Screening CT of 01/07/2018

CLINICAL DATA: Ex-smoker, quitting last year. 75 pack-year smoking
history.

EXAM:
CT CHEST WITHOUT CONTRAST LOW-DOSE FOR LUNG CANCER SCREENING
TECHNIQUE: Multidetector CT imaging of the chest was performed following the
standard protocol without IV contrast.

[ct lung segmentation data · axial · 0.72mm/px · z∈[+1042,+1042]mm · 10 of 345 frames shown]
[frame 1/345  mediastinal]
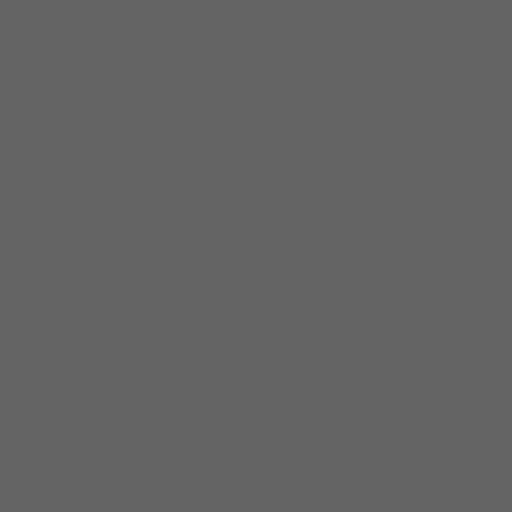
[frame 1/345  lung]
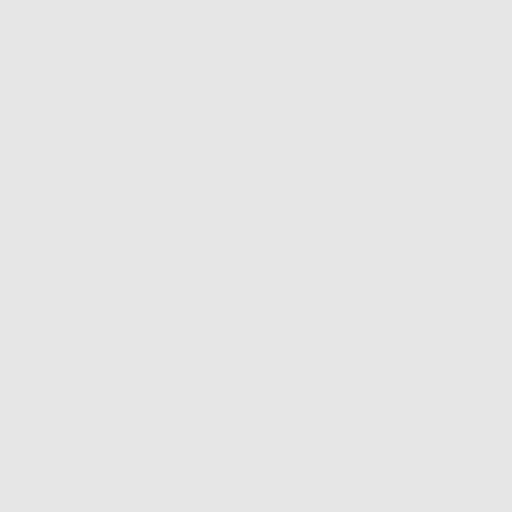
[frame 39/345  lung]
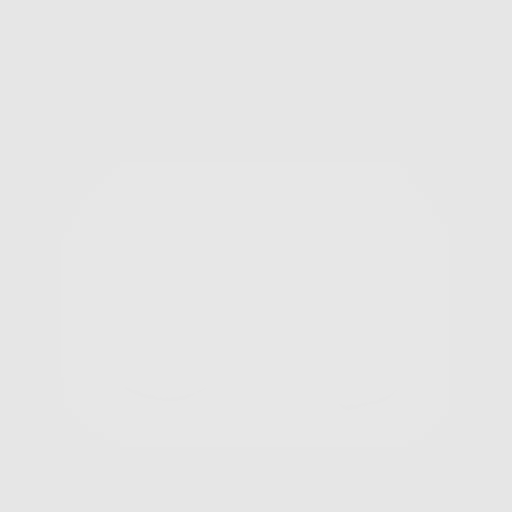
[frame 77/345  lung]
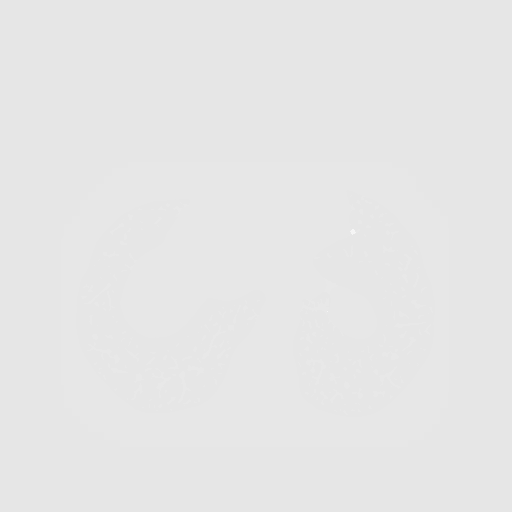
[frame 115/345  lung]
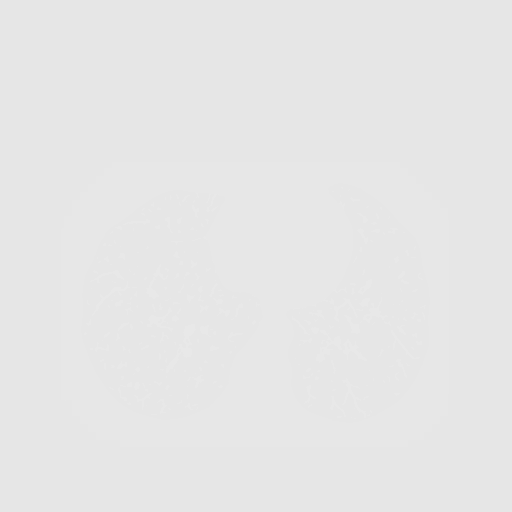
[frame 153/345  mediastinal]
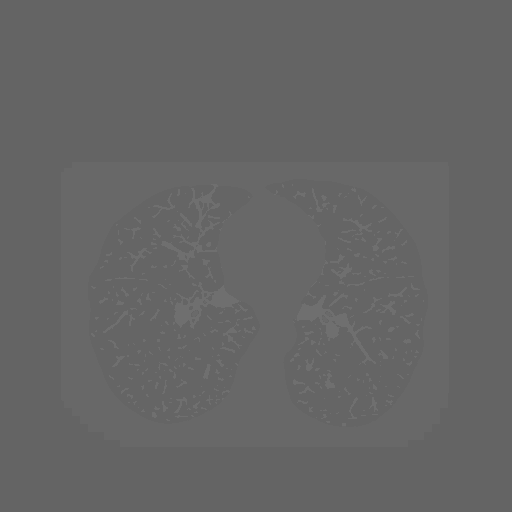
[frame 153/345  lung]
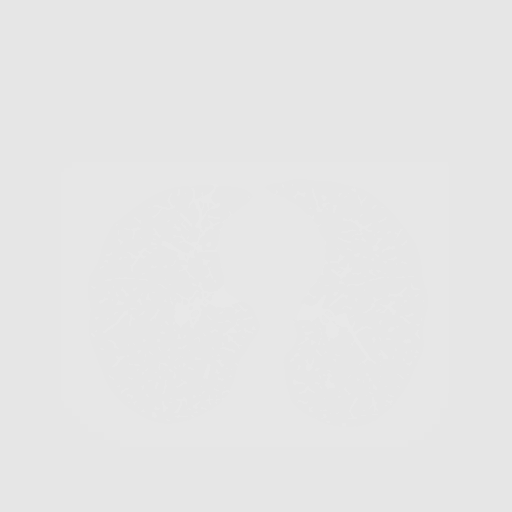
[frame 192/345  lung]
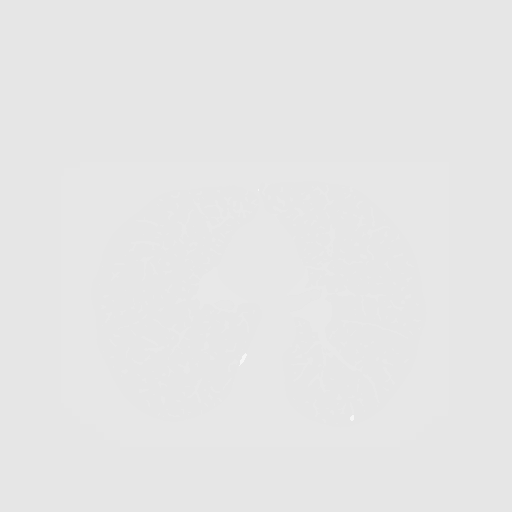
[frame 230/345  lung]
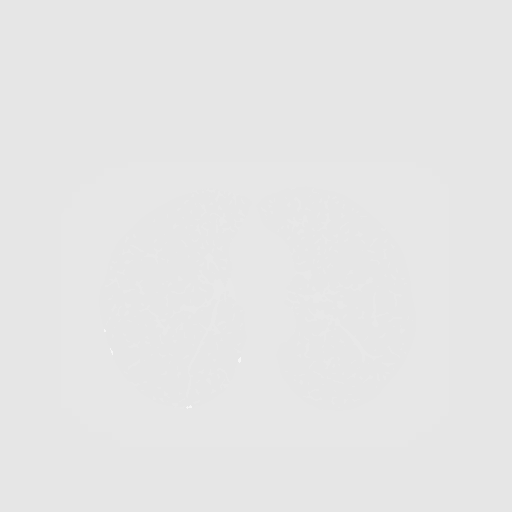
[frame 268/345  lung]
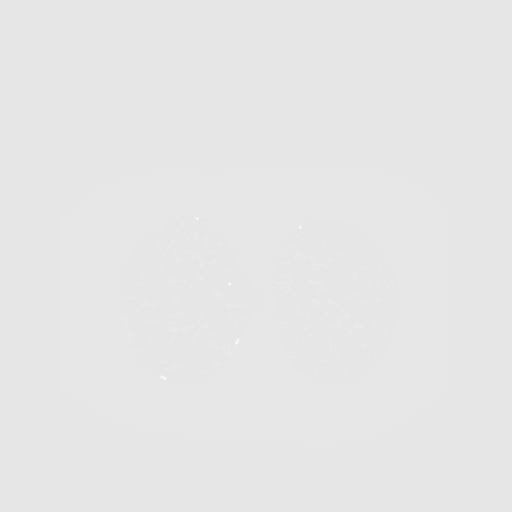
[frame 306/345  mediastinal]
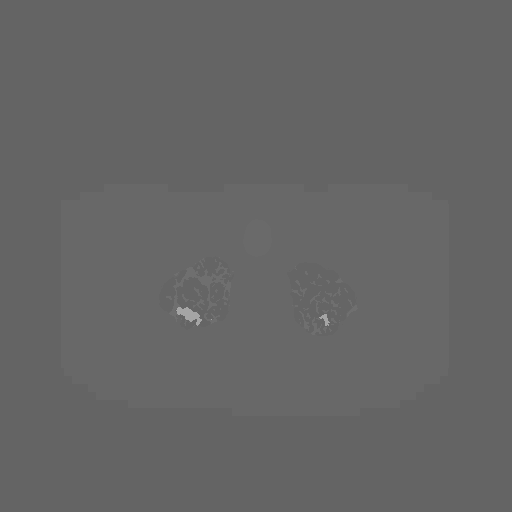
[frame 306/345  lung]
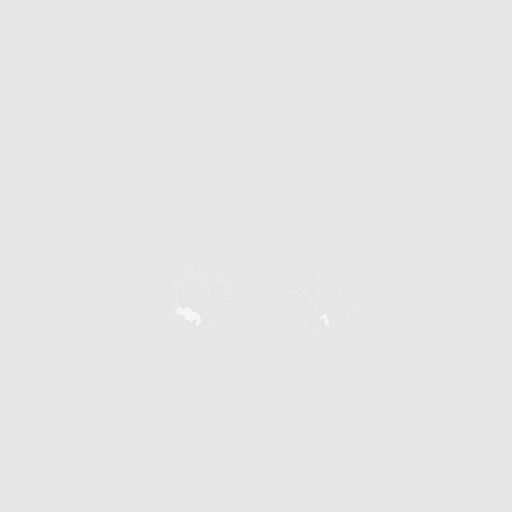
[frame 345/345  lung]
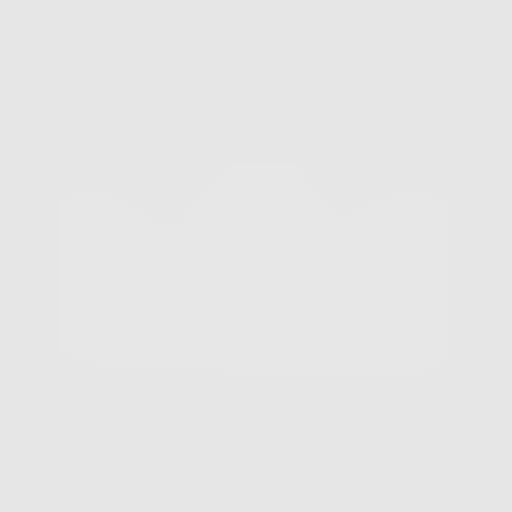

[10 of 20 positions shown; findings below may reference images not displayed]

FINDINGS: Cardiovascular: Aortic atherosclerosis. Normal heart size, without
pericardial effusion. Right coronary artery calcification.

Mediastinum/Nodes: No mediastinal or definite hilar adenopathy,
given limitations of unenhanced CT.

Lungs/Pleura: No pleural fluid. Moderate bullous type emphysema with
biapical pleuroparenchymal scarring. Bilateral pulmonary nodules are
similar, on the order of maximally 3.6 mm.

Upper Abdomen: Normal imaged portions of the liver, spleen, stomach,
pancreas, adrenal glands, kidneys.

Musculoskeletal: No acute osseous abnormality.
IMPRESSION: 1. Lung-RADS Category 2, benign appearance or behavior. Continue
annual screening with low-dose chest CT without contrast in 12
months.
2. Coronary artery atherosclerosis.

Aortic Atherosclerosis ([id number]).

## 2019-05-04 ENCOUNTER — Ambulatory Visit: Payer: Medicare Other | Admitting: Pulmonary Disease

## 2019-05-07 ENCOUNTER — Telehealth: Payer: Self-pay | Admitting: Family Medicine

## 2019-05-07 DIAGNOSIS — J449 Chronic obstructive pulmonary disease, unspecified: Secondary | ICD-10-CM

## 2019-05-07 DIAGNOSIS — E7800 Pure hypercholesterolemia, unspecified: Secondary | ICD-10-CM

## 2019-05-07 MED ORDER — SIMVASTATIN 20 MG PO TABS: ORAL_TABLET | ORAL | 0 refills | Status: DC

## 2019-05-07 MED ORDER — ALBUTEROL SULFATE HFA 108 (90 BASE) MCG/ACT IN AERS: 2.0000 | INHALATION_SPRAY | Freq: Four times a day (QID) | RESPIRATORY_TRACT | 0 refills | Status: DC | PRN

## 2019-05-07 NOTE — Telephone Encounter (Signed)
Pt left message she needs refills on Albuterol and Simvistatin.

## 2019-05-07 NOTE — Telephone Encounter (Signed)
Has visit scheduled for July. Okay to refill these meds (no add'l refills, just once)

## 2019-05-26 ENCOUNTER — Other Ambulatory Visit: Payer: Self-pay | Admitting: Family Medicine

## 2019-05-26 DIAGNOSIS — J449 Chronic obstructive pulmonary disease, unspecified: Secondary | ICD-10-CM

## 2019-05-26 NOTE — Telephone Encounter (Signed)
Pt just picked up the recent refill and does not need a new refill at this time. I will deny med

## 2019-05-26 NOTE — Telephone Encounter (Signed)
Is it okay to refill this or does this need to come from patient's pulmonologist

## 2019-05-26 NOTE — Telephone Encounter (Signed)
This was refilled for her on 05/07/2019 (per phone call request).  Previously, she had gotten from her pulm.  If she is truly needing a refill on this already, she needs to contact her pulmonologist (and may potentially need a visit, if not doing well, needing frequent albuterol).  Please advise pt.

## 2019-06-16 ENCOUNTER — Other Ambulatory Visit: Payer: Self-pay | Admitting: Family Medicine

## 2019-06-16 DIAGNOSIS — Z5181 Encounter for therapeutic drug level monitoring: Secondary | ICD-10-CM

## 2019-06-16 DIAGNOSIS — E7800 Pure hypercholesterolemia, unspecified: Secondary | ICD-10-CM

## 2019-06-16 DIAGNOSIS — I10 Essential (primary) hypertension: Secondary | ICD-10-CM

## 2019-06-16 DIAGNOSIS — E559 Vitamin D deficiency, unspecified: Secondary | ICD-10-CM

## 2019-06-17 ENCOUNTER — Encounter: Payer: Self-pay | Admitting: Pulmonary Disease

## 2019-06-17 ENCOUNTER — Other Ambulatory Visit: Payer: Self-pay

## 2019-06-17 ENCOUNTER — Ambulatory Visit (INDEPENDENT_AMBULATORY_CARE_PROVIDER_SITE_OTHER): Payer: Medicare Other | Admitting: Pulmonary Disease

## 2019-06-17 VITALS — BP 120/70 | HR 64 | Temp 97.9°F | Ht 67.0 in | Wt 153.8 lb

## 2019-06-17 DIAGNOSIS — J449 Chronic obstructive pulmonary disease, unspecified: Secondary | ICD-10-CM | POA: Diagnosis not present

## 2019-06-17 NOTE — Patient Instructions (Signed)
Severe COPD, GOLD Class B History of tobacco abuse Well-controlled with SABA. Consider LAMA at next visit. --CONTINUE Albuterol inhaler 2 puffs every 4 hours as needed for shortness of breath or wheezing --CT chest lung screen scheduled on 12/2019  Follow-up in 3 months

## 2019-06-17 NOTE — Progress Notes (Signed)
Subjective:   [name] GENDER: female DOB: [birth year], MRN: [id number]   HPI  Chief Complaint  Patient presents with  . Follow-up    F/U for COPD. States her breathing has been ok.    Ms. Tiffany [name] is a 73 year old female with severe COPD who presents for follow-up.  On her last clinic visit on 09/07/2018, she was seen for hospital follow-up for COPD exacerbation requiring antibiotics and steroids.  We refilled her Anoro inhaler however insurance does not cover it so she has not used it since our last visit. Previously used albuterol 1-2 times a week when Anoro, now using it 3-4 times a week especially when carrying groceries and laundry. Since we last met, she reports/denies ED/urgent care visits. After quitting smoking, she has seen a significant improvement in her cough. She monitors her oxygen and sats remain > 93% most of the time. Never below 88%.   Interval She overall feels her dyspnea well-controlled. Does not need any inhalers while walking in grocery story. Not taking any maintenance inhaler. Took sample Marmet however did not pick up prescription due to high deductible insurance plan. Taking albuterol every day with exertion which improves her dyspnea. She has shortness of breath and wheezing. Worsens with incline, heavy exertion, humidity.   Social History: Former smoker.  Quit 08/2018.  50 pack years  Environmental exposures: None  I have personally reviewed patient's past medical/family/social history/allergies/current medications.  Past Medical History:  Diagnosis Date  . Abnormal PFT 09/02/14   moderate restrictive ventilatory defect.  Marland Kitchen BCC (basal cell carcinoma), face 2010   Dr. Allyson Sabal  . Colon polyp 2/05  . Diverticulosis 11/2010   seen on colonoscopy and air contrast BE  . Emphysema of lung ([place])   . Hyperlipidemia   . Hypertension    previously treated with meds, resolved  . MGUS (monoclonal gammopathy of unknown  significance) 1999   prevously under care of Dr. Learta Codding  . Osteopenia    [facility]; 2014 at [facility]  . 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 Tobacco abuse      Family History  Problem Relation Age of Onset  . Stroke Mother   . Cerebral aneurysm Mother   . Hypertension Father   . Suicidality Father   . Diabetes Maternal Aunt   . Diabetes Maternal Aunt   . Diabetes Maternal Aunt   . Throat cancer Maternal Uncle   . Breast cancer Maternal Aunt        2 aunts  . Lung cancer Maternal Uncle      Social History   Occupational History  . Occupation: retired Manufacturing systems engineer)    Employer: RETIRED  Tobacco Use  . Smoking status: Former Smoker    Packs/day: 1.50    Years: 50.00    Pack years: 75.00    Types: Cigarettes    Quit date: 09/10/2018    Years since quitting: 0.7  . Smokeless tobacco: Never Used  Substance and Sexual Activity  . Alcohol use: Yes    Alcohol/week: 0.0 standard drinks    Comment: maybe 1-2 times per year. (rare beer in the summer)  . Drug use: No  . Sexual activity: Yes    Partners: Male    No Known Allergies   Outpatient Medications Prior to Visit  Medication Sig Dispense Refill  . albuterol (VENTOLIN HFA) 108 (90 Base) MCG/ACT inhaler Inhale 2 puffs into the  lungs every 6 (six) hours as needed for wheezing or shortness of breath. 18 g 0  . aspirin EC 81 MG tablet Take 1 tablet (81 mg total) by mouth daily.    . Calcium-Vitamin D (CALTRATE 600 PLUS-VIT D PO) Take 1 each by mouth daily.    . fluticasone (FLONASE) 50 MCG/ACT nasal spray Place 2 sprays into both nostrils daily. 16 g 0  . losartan (COZAAR) 25 MG tablet TAKE 2 TABLETS BY MOUTH EVERY DAY 180 tablet 1  . losartan (COZAAR) 50 MG tablet Take 1 tablet (50 mg total) by mouth daily. 90 tablet 0  . simvastatin (ZOCOR) 20 MG tablet TAKE 1 TABLET BY MOUTH EVERYDAY AT BEDTIME 90 tablet 0  . Vitamin D, Ergocalciferol, (DRISDOL) 1.25 MG (50000 UT)  CAPS capsule Take 1 capsule (50,000 Units total) by mouth every 7 (seven) days. 8 capsule 0  . guaiFENesin (MUCINEX) 600 MG 12 hr tablet Take 1 tablet (600 mg total) by mouth 2 (two) times daily. (Patient not taking: Reported on 09/24/2018) 60 tablet 0  . Tiotropium Bromide Monohydrate (SPIRIVA RESPIMAT) 2.5 MCG/ACT AERS Inhale 2 puffs into the lungs daily. 2 Inhaler 0  . umeclidinium-vilanterol (ANORO ELLIPTA) 62.5-25 MCG/INH AEPB Inhale 1 puff into the lungs daily. (Patient not taking: Reported on 12/03/2018) 60 each 5   No facility-administered medications prior to visit.     Review of Systems  Constitutional: Negative for chills, diaphoresis, fever, malaise/fatigue and weight loss.  HENT: Negative for congestion, ear pain and sore throat.   Respiratory: Negative for cough, hemoptysis, sputum production, shortness of breath and wheezing.   Cardiovascular: Negative for chest pain, palpitations and leg swelling.  Gastrointestinal: Negative for abdominal pain, heartburn and nausea.  Genitourinary: Negative for frequency.  Musculoskeletal: Negative for joint pain and myalgias.  Skin: Negative for itching and rash.  Neurological: Negative for dizziness, weakness and headaches.  Endo/Heme/Allergies: Does not bruise/bleed easily.  Psychiatric/Behavioral: Negative for depression. The patient is not nervous/anxious.      Objective:   Vitals:   06/17/19 1343  BP: 120/70  Pulse: 64  Temp: 97.9 F (36.6 C)  TempSrc: Oral  SpO2: 95%  Weight: 153 lb 12.8 oz (69.8 kg)  Height: _0  (1.702 m)   SpO2: 95 %  Physical Exam: General: Well-appearing, no acute distress HENT: [place], AT Eyes: EOMI, no scleral icterus Respiratory: Clear to auscultation bilaterally.  No crackles, wheezing or rales Cardiovascular: RRR, -M/R/G, no JVD GI: BS+, soft, nontender Extremities:-Edema,-tenderness Neuro: AAO x4, CNII-XII grossly intact Skin: Intact, no rashes or bruising Psych: Normal mood, normal affect   Chest imaging: CXR 09/10/2018- hyperinflated lung fields.  No acute infiltrate/effusion or edema CT Chest Lung 01/12/19 - Emphysema, unchanged pulmonary nodules  PFT: 12/02/2017-severe obstructive defect. Ratio 58 FEV1 37 FVC 48  Imaging, labs and test noted above have been reviewed independently by me.    Assessment & Plan:   Severe COPD, GOLD Class B History of tobacco abuse Well-controlled with SABA. Consider LAMA at next visit. --CONTINUE Albuterol inhaler 2 puffs every 4 hours as needed for shortness of breath or wheezing --CT chest lung screen scheduled on 12/2019  Vaccinations-UTD Pneumovax 2016 Influenza 2019   No orders of the defined types were placed in this encounter.  No orders of the defined types were placed in this encounter.   Return in about 3 months (around 09/17/2019).   Rodman Pickle, MD [facility] 06/21/2019 6:15 PM  Personal pager: [phone] If unanswered, please page CCM  On-call: [phone]

## 2019-06-20 NOTE — Patient Instructions (Addendum)
[facility] to [facility] your [facility] mamm[facility] (last was 02/2018). Next bone density test will be due April of 2021 (2 years from the last; okay to wait an extra few months to have it the same time as your mammogram if that would be easier for you).  Please call us with the dose of your Vitamin D3 (?1000 IU, 2000 IU?) so that I can make recommendations once I get your Vitamin D level back.   Don't use more than 2 puffs at a time of the albuterol inhaler.  Try using it PRIOR to doing the activities that cause shortness of breath (laundry/trash can).  We are going to check an ultrasound of your carotid artery.  If you don't get a call about an appointment within a week, please call us.

## 2019-06-20 NOTE — Progress Notes (Signed)
Chief Complaint  Patient presents with  . Medication Management    no refills needed, no current complaints     Patient presents for 6 month med check.  She is fasting for labs today.  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  She wasn't able to follow-up on the surgery (appt was cancelled due to [place]).  They called, asked if she was having problems, and she wasn't, but she complains of itching in the right ear, which has gotten worse (surgery was Feb or March).  Denies pain, bleeding or decreased hearing.    Hypertension. Sheis compliant with takinglosartan 50mg . BP's have been running 120's-130/70's.She denies side effects. Denies headaches, chest pain, palpitations, edema, shortness of breath (other than from COPD). Slight chronic smoker's cough.  Hyperlipidemia: She is compliant with taking simvastatin. Denies side effect. Tries to follow lowfat, low cholesterol diet She was noted to have aortic atherosclerosis on CT for lung cancer screening. Other cardiac risk factors include HTN, former h/o smoking, which is why statin was started. She declined the stress test previously recommended by cardiologist, Dr. Saunders Revel. She has chronic DOE, which may also be related to her COPD. Calcified atherosclerotic plaque was noted in R coronary artery on CT scan (incidentally noted on lung CA screen) 12/2017. She is fasting for labs today, was at goal on last check a year ago. Lab Results  Component Value Date   CHOL 160 06/04/2018   HDL 56 06/04/2018   LDLCALC 84 06/04/2018   TRIG 100 06/04/2018   CHOLHDL 2.9 06/04/2018   Osteopenia--She previously took bisphosphonates (changed from fosamax (due to nausea) to Actonel, as well as Boniva, both of which she tolerated but stopped due to cost issues, possibly some GI side effects (she stopped without contacting us).She takes a calcium +D supplement daily. She doesn't get any regular weight-bearing exercise. She quit smoking 08/2018.  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 We did not recommend restarting medication, but encouraged Ca, D (has h/o deficiency, see below), tobacco cessation (which she has done) and weight-bearing exercise, and to recheck in 2 years.  Vitamin D deficiency. Last level was 24.6 in 11/2018 (prior to that had been 28.2 in 05/2018, 25 in 11/2017. At that time, she was taking 1000 IU of vitamin D3. She had been advised to increase her D3 dose to 2000 IU daily.)  In January she was prescribed and 8 week course of prescription vitamin D, and was advised to take 2000 IU upon rx completion.  She is currently taking 1 pill daily, unsure of the dose.  She will check her bottle and call us.  Tobacco use and COPD--She quit smoking 09/10/18.  Her husband has also quit. Her shortness of breath is much better since she quit, but still gets "a little out of breath"  going up and down the stairs to do her laundry, and in carrying her groceries in (up a hill to her house).   She is now under the care of pulmonary (since her hospitalization 08/2018; last saw them last week, doing well on SABA only. (other medications had been prescribed/recommended (Anoro, more recently Spiriva), but were too expensive). She is using albuterol daily (sometimes takes 3 puffs).  No longer has to take at night since she quit smoking. She last had lung cancer screeningCT inFebruary 2020, recommended to continue  yearly.  H/o elevated TSH--TSH of 4.75 in12/2017,with normal free T4; recheck in June2018was stable(4.44). TSH was subsequently normal. She deniesany changes in hair/skin/bowels/energy/temperatureintolerance.  Notes some hair loss, nothing new recently, just with age. Had gained some weight after quitting smoking, no longer has "suckers". Lab Results  Component Value Date   TSH 3.700  12/03/2018    PMH, PSH, SH reviewed  Outpatient Encounter Medications as of 06/22/2019  Medication Sig Note  . albuterol (VENTOLIN HFA) 108 (90 Base) MCG/ACT inhaler Inhale 2 puffs into the lungs every 6 (six) hours as needed for wheezing or shortness of breath.   Marland Kitchen aspirin EC 81 MG tablet Take 1 tablet (81 mg total) by mouth daily.   . Calcium-Vitamin D (CALTRATE 600 PLUS-VIT D PO) Take 1 each by mouth daily.   . Cholecalciferol (VITAMIN D3 PO) Take 1 capsule by mouth daily. 06/22/2019: Unsure of dose--will call with it  . losartan (COZAAR) 25 MG tablet TAKE 2 TABLETS BY MOUTH EVERY DAY   . simvastatin (ZOCOR) 20 MG tablet TAKE 1 TABLET BY MOUTH EVERYDAY AT BEDTIME   . fluticasone (FLONASE) 50 MCG/ACT nasal spray Place 2 sprays into both nostrils daily. (Patient not taking: Reported on 06/22/2019) 12/03/2018: Uses prn, not recently  . losartan (COZAAR) 50 MG tablet Take 1 tablet (50 mg total) by mouth daily. (Patient not taking: Reported on 06/22/2019) 06/22/2019: Pharmacy don't currently have 50mg  in stock.   . [DISCONTINUED] guaiFENesin (MUCINEX) 600 MG 12 hr tablet Take 1 tablet (600 mg total) by mouth 2 (two) times daily. (Patient not taking: Reported on 09/24/2018) 12/03/2018: Used earlier in the week when she had a sore throat.  Uses prn  . [DISCONTINUED] Tiotropium Bromide Monohydrate (SPIRIVA RESPIMAT) 2.5 MCG/ACT AERS Inhale 2 puffs into the lungs daily.   . [DISCONTINUED] umeclidinium-vilanterol (ANORO ELLIPTA) 62.5-25 MCG/INH AEPB Inhale 1 puff into the lungs daily. (Patient not taking: Reported on 12/03/2018) 12/03/2018: Not covered by insurance.  Only used what he was sent home with, not currently taking  . [DISCONTINUED] Vitamin D, Ergocalciferol, (DRISDOL) 1.25 MG (50000 UT) CAPS capsule Take 1 capsule (50,000 Units total) by mouth every 7 (seven) days. (Patient not taking: Reported on 06/22/2019)    No facility-administered encounter medications on file as of 06/22/2019.    No Known  Allergies  ROS: no fever, chills, headaches, dizziness, chest pain.  Dyspnea per HPI, at baseline.  No nausea, vomiting, bowel changes, urinary complaints, bleeding, bruising, rashes or mood changes.  No changes in hair/skin/energy. Weight is stable. Right itchy ear, per HPI  PHYSICAL EXAM:  BP 132/70   Pulse (!) 57   Temp 98.6 F (37 C)   Ht 5\' 7"  (1.702 m)   Wt 154 lb 6.4 oz (70 kg)   SpO2 94%   BMI 24.18 kg/m    Wt Readings from Last 3 Encounters:  06/17/19 153 lb 12.8 oz (69.8 kg)  12/31/18 149 lb (67.6 kg)  12/03/18 151 lb (68.5 kg)   HEENT: conjunctiva and sclera are clear. EOMI. Small amount of cerumen in ears bilaterally. Normal TM's.  Right ear--the cerumen is mostly anteriorly along the canal.  No erythema, soft tissue swelling or other abnormality noted. Wearing mask due to COVID-19 pandemic, nose/OP not examined today. Neck: no lymphadenopathy, thyromegaly or mass. Slight bruit noted on the left. Heart: regular rate and rhythm, no murmur Lungs: somewhat distant. No wheezes, rales, ronchi Back: no spinal or CVA tenderness Abdomen: soft, nontender, no organomegaly or mass Extremities: no edema,  normal pulses Skin: normal turgor. Healing wound L shin. Purpura at the right shin. Psych: normal mood, affect, hygiene and grooming Neuro: alert and oriented, cranial nerves intact, normal gait   ASSESSMENT/PLAN:  Essential hypertension - well controlled - Plan: Comprehensive metabolic panel  Pure hypercholesterolemia - due for recheck; cont simvastatin - Plan: Lipid panel  Vitamin D deficiency - pt to contact us with her current dose of vitamin D3 - Plan: VITAMIN D 25 Hydroxy (Vit-D Deficiency, Fractures)  Chronic obstructive pulmonary disease, unspecified COPD type ([place]) - stable; continue albuterol prn, try using before strenuous activity rather than as rescue  Aortic atherosclerosis ([facility]) - cont statin, aspirin  Osteopenia, unspecified location - encouraged to  get weight-bearing exercise, as well as discussed Ca and D   Medication monitoring encounter - Plan: Lipid panel, VITAMIN D 25 Hydroxy (Vit-D Deficiency, Fractures), Comprehensive metabolic panel  Bruit of left carotid artery - check [place]; discussed that if not significant blockage, treatment is same--aspirin, BP control and statin, and remain nonsmoker - Plan: US Carotid Duplex Bilateral   Due for mammo (last was 02/2018), reminded to schedule DEXA due 02/2020  Needs CPE/AWV scheduled for 11/2019

## 2019-06-22 ENCOUNTER — Encounter: Payer: Self-pay | Admitting: Family Medicine

## 2019-06-22 ENCOUNTER — Other Ambulatory Visit: Payer: Self-pay

## 2019-06-22 ENCOUNTER — Ambulatory Visit (INDEPENDENT_AMBULATORY_CARE_PROVIDER_SITE_OTHER): Payer: Medicare Other | Admitting: Family Medicine

## 2019-06-22 VITALS — BP 132/70 | HR 57 | Temp 98.6°F | Ht 67.0 in | Wt 154.4 lb

## 2019-06-22 DIAGNOSIS — M8580 Other specified disorders of bone density and structure, unspecified site: Secondary | ICD-10-CM

## 2019-06-22 DIAGNOSIS — I10 Essential (primary) hypertension: Secondary | ICD-10-CM

## 2019-06-22 DIAGNOSIS — Z5181 Encounter for therapeutic drug level monitoring: Secondary | ICD-10-CM | POA: Diagnosis not present

## 2019-06-22 DIAGNOSIS — E7800 Pure hypercholesterolemia, unspecified: Secondary | ICD-10-CM | POA: Diagnosis not present

## 2019-06-22 DIAGNOSIS — J449 Chronic obstructive pulmonary disease, unspecified: Secondary | ICD-10-CM | POA: Diagnosis not present

## 2019-06-22 DIAGNOSIS — I700 Atherosclerosis of aorta: Secondary | ICD-10-CM

## 2019-06-22 DIAGNOSIS — E559 Vitamin D deficiency, unspecified: Secondary | ICD-10-CM

## 2019-06-22 DIAGNOSIS — R0989 Other specified symptoms and signs involving the circulatory and respiratory systems: Secondary | ICD-10-CM

## 2019-06-22 NOTE — Progress Notes (Signed)
Patient has been advised that she has an appointment on 8/4/20to have Carotid Artery [place]. Patient is scheduled to arrive at 9:55 for a 10:15 appointment.  [place] [address].

## 2019-06-23 LAB — COMPREHENSIVE METABOLIC PANEL
ALT: 14 IU/L (ref 0–32)
AST: 21 IU/L (ref 0–40)
Albumin/Globulin Ratio: 1.5 (ref 1.2–2.2)
Albumin: 4.3 g/dL (ref 3.7–4.7)
Alkaline Phosphatase: 97 IU/L (ref 39–117)
BUN/Creatinine Ratio: 12 (ref 12–28)
BUN: 8 mg/dL (ref 8–27)
Bilirubin Total: 0.4 mg/dL (ref 0.0–1.2)
CO2: 24 mmol/L (ref 20–29)
Calcium: 8.9 mg/dL (ref 8.7–10.3)
Chloride: 99 mmol/L (ref 96–106)
Creatinine, Ser: 0.69 mg/dL (ref 0.57–1.00)
GFR calc Af Amer: 101 mL/min/{1.73_m2} (ref >59)
GFR calc non Af Amer: 88 mL/min/{1.73_m2} (ref >59)
Globulin, Total: 2.8 g/dL (ref 1.5–4.5)
Glucose: 94 mg/dL (ref 65–99)
Potassium: 4.7 mmol/L (ref 3.5–5.2)
Sodium: 139 mmol/L (ref 134–144)
Total Protein: 7.1 g/dL (ref 6.0–8.5)

## 2019-06-23 LAB — VITAMIN D 25 HYDROXY (VIT D DEFICIENCY, FRACTURES): Vit D, 25-Hydroxy: 29.1 ng/mL — ABNORMAL LOW (ref 30.0–100.0)

## 2019-06-23 LAB — LIPID PANEL
Chol/HDL Ratio: 2.7 ratio (ref 0.0–4.4)
Cholesterol, Total: 146 mg/dL (ref 100–199)
HDL: 54 mg/dL (ref >39)
LDL Calculated: 75 mg/dL (ref 0–99)
Triglycerides: 86 mg/dL (ref 0–149)
VLDL Cholesterol Cal: 17 mg/dL (ref 5–40)

## 2019-06-30 ENCOUNTER — Ambulatory Visit
Admission: RE | Admit: 2019-06-30 | Discharge: 2019-06-30 | Disposition: A | Discharge status: Home / Self Care | Payer: Medicare Other | Source: Ambulatory Visit | Attending: Family Medicine | Admitting: Family Medicine

## 2019-06-30 DIAGNOSIS — I6523 Occlusion and stenosis of bilateral carotid arteries: Secondary | ICD-10-CM | POA: Diagnosis not present

## 2019-06-30 DIAGNOSIS — R0989 Other specified symptoms and signs involving the circulatory and respiratory systems: Secondary | ICD-10-CM

## 2019-07-30 ENCOUNTER — Other Ambulatory Visit: Payer: Self-pay | Admitting: Family Medicine

## 2019-07-30 DIAGNOSIS — E7800 Pure hypercholesterolemia, unspecified: Secondary | ICD-10-CM

## 2019-08-23 ENCOUNTER — Other Ambulatory Visit: Payer: Self-pay | Admitting: Family Medicine

## 2019-09-15 ENCOUNTER — Encounter: Payer: Self-pay | Admitting: Pulmonary Disease

## 2019-09-15 ENCOUNTER — Other Ambulatory Visit: Payer: Self-pay

## 2019-09-15 ENCOUNTER — Ambulatory Visit: Payer: Medicare Other | Admitting: Pulmonary Disease

## 2019-09-15 VITALS — BP 126/60 | HR 65 | Temp 98.1°F | Ht 67.0 in | Wt 155.0 lb

## 2019-09-15 DIAGNOSIS — J449 Chronic obstructive pulmonary disease, unspecified: Secondary | ICD-10-CM | POA: Diagnosis not present

## 2019-09-15 DIAGNOSIS — Z23 Encounter for immunization: Secondary | ICD-10-CM | POA: Diagnosis not present

## 2019-09-15 MED ORDER — INCRUSE ELLIPTA 62.5 MCG/INH IN AEPB: 1.0000 | INHALATION_SPRAY | Freq: Every day | RESPIRATORY_TRACT | 0 refills | Status: DC

## 2019-09-15 MED ORDER — INCRUSE ELLIPTA 62.5 MCG/INH IN AEPB: 1.0000 | INHALATION_SPRAY | Freq: Every day | RESPIRATORY_TRACT | 3 refills | Status: DC

## 2019-09-15 NOTE — Patient Instructions (Addendum)
Severe COPD, GOLD Class B --START Incruse daily. We will arrange for pharmacy team to discuss financial assistance. --CONTINUE Albuterol as needed   Follow-up in 3 months  Influenza vaccine given today

## 2019-09-15 NOTE — Progress Notes (Signed)
Subjective:   [name] GENDER: female DOB: April 27, 1946, MRN: [id number]   HPI  Chief Complaint  Patient presents with  . Follow-up    wheezing at times, uses rescue inhaler daily when carryingin groceries or walking up stairs   Ms. Tiffany [name] is 73 year old female with severe COPD who presents for follow-up.  She reports dyspnea on exertion associated with wheezing. She notices it worsens when walking to her mailbox or while carrying her groceries. Her [place] located [place] and she will often use it when ambulating on her property. She has had to use albuterol at least once daily which improves her symptoms. She is not on any maintenance inhalers due to cost. She was deterred from filling out any other inhalers due to cost. Last COPD exacerbation in October 2019. No ED/hospitalizations since our last visit.   Social History: Former smoker.  Quit 08/2018.  50 pack years  I have personally reviewed patient's past medical/family/social history/allergies/current medications.  Past Medical History:  Diagnosis Date  . Abnormal PFT 09/02/14   moderate restrictive ventilatory defect.  Marland Kitchen BCC (basal cell carcinoma), face 2010   Dr. Allyson Sabal  . Colon polyp 2/05  . Diverticulosis 11/2010   seen on colonoscopy and air contrast BE  . Emphysema of lung ([place])   . Hyperlipidemia   . Hypertension    previously treated with meds, resolved  . MGUS (monoclonal gammopathy of unknown significance) 1999   prevously under care of Dr. Learta Codding  . Osteopenia    DEXA 01/31/2010 at [facility]; 2014 at [facility]  . 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 Tobacco abuse      Family History  Problem Relation Age of Onset  . Stroke Mother   . Cerebral aneurysm Mother   . Hypertension Father   . Suicidality Father   . Diabetes Maternal Aunt   . Diabetes Maternal Aunt   . Diabetes Maternal Aunt   . Throat cancer Maternal Uncle   . Breast cancer Maternal  Aunt        2 aunts  . Lung cancer Maternal Uncle      Social History   Occupational History  . Occupation: retired Manufacturing systems engineer)    Employer: RETIRED  Tobacco Use  . Smoking status: Former Smoker    Packs/day: 2.00    Years: 59.00    Pack years: 118.00    Types: Cigarettes    Start date: 35    Quit date: 09/10/2018    Years since quitting: 1.0  . Smokeless tobacco: Never Used  Substance and Sexual Activity  . Alcohol use: Yes    Alcohol/week: 0.0 standard drinks    Comment: maybe 1-2 times per year. (rare beer in the summer)  . Drug use: No  . Sexual activity: Yes    Partners: Male    No Known Allergies   Outpatient Medications Prior to Visit  Medication Sig Dispense Refill  . albuterol (VENTOLIN HFA) 108 (90 Base) MCG/ACT inhaler Inhale 2 puffs into the lungs every 6 (six) hours as needed for wheezing or shortness of breath. 18 g 0  . aspirin EC 81 MG tablet Take 1 tablet (81 mg total) by mouth daily.    . Calcium-Vitamin D (CALTRATE 600 PLUS-VIT D PO) Take 1 each by mouth daily.    . Cholecalciferol (VITAMIN D3 PO) Take 1 capsule by mouth daily.    . fluticasone (FLONASE) 50  MCG/ACT nasal spray Place 2 sprays into both nostrils daily. 16 g 0  . losartan (COZAAR) 25 MG tablet TAKE 2 TABLETS BY MOUTH EVERY DAY 180 tablet 0  . losartan (COZAAR) 50 MG tablet Take 1 tablet (50 mg total) by mouth daily. 90 tablet 0  . simvastatin (ZOCOR) 20 MG tablet TAKE 1 TABLET BY MOUTH EVERYDAY AT BEDTIME 90 tablet 1   No facility-administered medications prior to visit.     Review of Systems  Constitutional: Positive for malaise/fatigue. Negative for chills and fever.  HENT: Negative for congestion.   Respiratory: Positive for shortness of breath and wheezing. Negative for cough and sputum production.   Cardiovascular: Negative for chest pain, palpitations, leg swelling and PND.  Neurological: Negative for focal weakness and weakness.  Endo/Heme/Allergies: Negative  for environmental allergies.   Objective:   Vitals:   09/15/19 1136 09/15/19 1137  BP:  126/60  Pulse:  65  Temp: 98.1 F (36.7 C)   TempSrc: Temporal   SpO2:  93%  Weight: 155 lb (70.3 kg)   Height: 5\' 7"  (1.702 m)    SpO2: 93 % O2 Device: None (Room air)  Physical Exam: General: Well-appearing, no acute distress HENT: Time, AT Eyes: EOMI, no scleral icterus Respiratory: Diminished breath sounds bilaterally. No crackles, wheezing or rales Cardiovascular: RRR, -M/R/G, no JVD GI: BS+, soft, nontender Extremities:-Edema,-tenderness Neuro: AAO x4, CNII-XII grossly intact Skin: Intact, no rashes or bruising Psych: Normal mood, normal affect  Chest imaging: CXR 09/10/2018- hyperinflated lung fields.  No acute infiltrate/effusion or edema CT Chest Lung 01/12/19 - Emphysema, unchanged pulmonary nodules  PFT: 12/02/2017-severe obstructive defect. Ratio 58 FEV1 37 FVC 48  Imaging, labs and test noted above have been reviewed independently by me.    Assessment & Plan:   Severe COPD, GOLD Class B Not well-controlled. Symptomatic with exertion with mMRC >2. We discussed the clinical course of COPD, her inhaler regimen and inhaler rescue plan during this visit. Educated on the different types of inhalers that she could benefit from and discussed referral to [facility] for assistance as this is a barrier to her receiving proper care for her COPD.  --START Incruse daily. We will arrange for pharmacy team to discuss financial assistance. --CONTINUE Albuterol as needed  --CT chest lung screen scheduled on 12/2019  Vaccinations-UTD Immunization History  Administered Date(s) Administered  . Influenza Split 10/16/2012  . Influenza, High Dose Seasonal PF 09/02/2013, 08/25/2014, 08/31/2015, 11/01/2016, 09/09/2017, 09/09/2018  . Pneumococcal Conjugate-13 08/25/2014  . Pneumococcal Polysaccharide-23 12/12/2009, 08/31/2015  . Tdap 12/12/2009  Influenza due today  Orders Placed This Encounter   Procedures  . AMB Referral to [place] Management    Referral Priority:   Routine    Referral Type:   Consultation    Referral Reason:   [facility] Management    Number of Visits Requested:   1   Meds ordered this encounter  Medications  . umeclidinium bromide (INCRUSE ELLIPTA) 62.5 MCG/INH AEPB    Sig: Inhale 1 puff into the lungs daily.    Dispense:  1 each    Refill:  0  . umeclidinium bromide (INCRUSE ELLIPTA) 62.5 MCG/INH AEPB    Sig: Inhale 1 puff into the lungs daily.    Dispense:  1 each    Refill:  3    Return in about 3 months (around 12/16/2019).   Greater than 50% of this patient 25-minute office visit was spent face-to-face in counseling with the patient/family. We discussed medical diagnosis and treatment  plan as noted.   Rodman Pickle, MD [facility] 09/15/2019 11:46 AM

## 2019-09-17 ENCOUNTER — Other Ambulatory Visit: Payer: Self-pay | Admitting: *Deleted

## 2019-09-17 NOTE — Patient Outreach (Signed)
[place] [facility]) Care Management  09/17/2019  Tiffany [name] Dec 05, 1945 [id number]   Referral Date: 09/15/2019 Referral Source: MD office (pulmonology) Referral Reason: patient having trouble with medication and affordability.   Insurance: [employer] attempt # 1, successful.  Social: Lives with husband, able to perform all ADL's & IADL's independently.  Conditions: Per chart, has history of HTN, COPD, and vitamin D deficiency.  Medications: Reviewed with member, report MD recently ordered Incruse to use to manage COPD. Report MD wanted to use Anorro but she was unable to afford it.  State she is using inhalers as instructed but is still concerned about cost.  Appointments: Was seen by Pulmonology office on 10/20, will follow up in 3 months.  Was seen by PCP for annual exam in January and follow up 7/27, next appointment scheduled for Feb. 2021. Denies the need for transportation assistance.  Assessment complete, denies the need for complex coordination but is open to ongoing management of COPD to remain stable.  Plan: RN CM will place referral to pharmacist for cost of inhalers and to health coach for disease management.  Valente David, [place], MSN [place] [phone]

## 2019-09-18 ENCOUNTER — Other Ambulatory Visit: Payer: Self-pay | Admitting: *Deleted

## 2019-09-18 ENCOUNTER — Other Ambulatory Visit: Payer: Self-pay | Admitting: Pharmacist

## 2019-09-18 NOTE — Patient Outreach (Signed)
[place] [facility]) Care Management  [place]   09/18/2019  Tiffany [name] January 12, 1946 [id number]  Reason for referral: Medication Assistance  Referral source: Dr. Loanne Drilling Current insurance: [employer] [facility] [employer]  PMHx includes but not limited to:  Severe COPD, former smoker (50 pack year history), diverticulosis, HTN, HLD, osteopenia, MGUS, hx BCC  Outreach:  Successful telephone call with patient.  HIPAA identifiers verified.   Subjective:  Patient reports she is using samples of Incruse and it has greatly helped her breathing.  She reports improved breathing after bringing in garbage cans and has not had to use albuterol after she does this like she previously needed to after starting Incruse.  She has not picked up Incruse from the pharmacy yet because she is worried about the cost.  She reports her PCP tried to put her on Anoro Ellipta last year but could not start it due to cost.  Estimated annual income with spouse = ~$35,600.   Objective: The 10-year ASCVD risk score Mikey Bussing DC Jr., et al., 2013) is: 21.3%   Values used to calculate the score:     Age: 25 years     Sex: Female     Is Non-Hispanic African American: No     Diabetic: No     Tobacco smoker: Yes     Systolic Blood Pressure: 409 mmHg     Is BP treated: Yes     HDL Cholesterol: 54 mg/dL     Total Cholesterol: 146 mg/dL  Lab Results  Component Value Date   CREATININE 0.69 06/22/2019   CREATININE 0.89 12/03/2018   CREATININE 0.79 09/22/2018    No results found for: HGBA1C  Lipid Panel     Component Value Date/Time   CHOL 146 06/22/2019 0930   TRIG 86 06/22/2019 0930   HDL 54 06/22/2019 0930   CHOLHDL 2.7 06/22/2019 0930   CHOLHDL 3.7 12/02/2017 0945   VLDL 23 05/06/2017 1028   LDLCALC 75 06/22/2019 0930   LDLCALC 122 (H) 12/02/2017 0945    BP Readings from Last 3 Encounters:  09/15/19 126/60  06/22/19 132/70  06/17/19 120/70    No Known Allergies  Medications Reviewed  Today    Reviewed by Amado Coe, RN (Registered Nurse) on 09/15/19 at [place] List Status: <None>  Medication Order Taking? Sig Documenting Provider Last Dose Status Informant  albuterol (VENTOLIN HFA) 108 (90 Base) MCG/ACT inhaler [id number] Yes Inhale 2 puffs into the lungs every 6 (six) hours as needed for wheezing or shortness of breath. Rita Ohara, MD Taking Active   aspirin EC 81 MG tablet [id number] Yes Take 1 tablet (81 mg total) by mouth daily. Nelva Bush, MD Taking Active            Med Note [facility], EVE   Mon Dec 02, 2017  9:27 AM)    Calcium-Vitamin D (CALTRATE 600 PLUS-VIT D PO) [id number] Yes Take 1 each by mouth daily. [provider] Taking Active   Cholecalciferol (VITAMIN D3 PO) [id number] Yes Take 1 capsule by mouth daily. [provider] Taking Active            Med Note ([facility], EVE   Mon Jun 22, 2019  9:00 AM) Unsure of dose--will call with it  fluticasone (FLONASE) 50 MCG/ACT nasal spray [id number] Yes Place 2 sprays into both nostrils daily. Florencia Reasons, MD Taking Active            Med Note ([facility], EVE   Wed  Dec 03, 2018  9:07 AM) Uses prn, not recently  losartan (COZAAR) 25 MG tablet [id number] Yes TAKE 2 TABLETS BY MOUTH EVERY DAY Rita Ohara, MD Taking Active   losartan (COZAAR) 50 MG tablet [id number] Yes Take 1 tablet (50 mg total) by mouth daily. Rita Ohara, MD Taking Active            Med Note Arnoldo Morale, GENERA   Mon Jun 22, 2019  8:39 AM) Pharmacy don't currently have '50mg'$  in stock.   simvastatin (ZOCOR) 20 MG tablet [id number] Yes TAKE 1 TABLET BY MOUTH EVERYDAY AT BEDTIME Rita Ohara, MD Taking Active           Assessment: Medication Review Findings:  . Not taking aspirin ever day.  Encouraged patient to keep aspirin with other medication bottles and to take consistently as instructed by providers . Losartan: Taking 2 tablets of '25mg'$  (total '50mg'$ ) daily due to pharmacy not being able to stock the '50mg'$  strength.  . Fluticasone: Taking PRN  rather than daily.  Patient reports providers are aware.    Medication Assistance Findings:  Medication assistance needs identified: Albuterol, Incruse Ellipta  Extra Help:  Not eligible for Extra Help Low Income Subsidy based on reported income and assets   [facility].  Per pharmacist, co-pay for Incruse = $182.  Per review of other medications which are all Tier 1, patient should not be in the coverage gap therefore she likely has a deductible to meet before regular $47 co-pay will take effect.  Patient states she likely cannot even afford the $47 co-pay.     Patient Assistance Programs: Incruse made by [place] requirement met: Yes o Out-of-pocket prescription expenditure met:   No ($600) - Patient has not met application requirements to apply for this program at this time.  - Reviewed program requirements with patient.    - Alternative is to apply for another medication in the same class (LAMA).   Marland Kitchen Options include: Spiriva, however income is borderline for eligibility . Only other program that patient may qualify for is Merck which does not make a LAMA monotherapy product.   o Merck products include: Proventil, Dulera, Asmanex.       Plan: . Will reach out to Dr. Loanne Drilling regarding medication management.  Can try to apply for Spiriva, if denied, can discuss alternative medication class options.   Ralene Bathe, PharmD, [place] [phone]

## 2019-09-21 ENCOUNTER — Other Ambulatory Visit: Payer: Self-pay | Admitting: Pharmacist

## 2019-09-21 NOTE — Patient Outreach (Signed)
[place] [facility]) Care Management  [place] 09/21/2019  Bernis Huey Rede 22-Apr-1946 [id number]  Communication received from Dr. Loanne Drilling (pulmonologist) regarding patient assistance programs for patient.  Ok to apply for Spiriva and see if patient can be approved for LAMA substitution from Incruse as patient not eligible for [place] due to [place].  Will also include albuterol inhaler application as patient stated this was expensive for her.    Plan: I will route patient assistance letter to [place] technician who will coordinate patient assistance program application process for medications listed above.  [facility] pharmacy technician will assist with obtaining all required documents from both patient and provider(s) and submit application(s) once completed.    Ralene Bathe, PharmD, [place] [phone]

## 2019-09-22 ENCOUNTER — Other Ambulatory Visit: Payer: Self-pay | Admitting: Pharmacy Technician

## 2019-09-22 ENCOUNTER — Ambulatory Visit: Payer: Self-pay | Admitting: Pharmacist

## 2019-09-22 NOTE — Patient Outreach (Signed)
[facility]) Care Management  09/22/2019  Larena Desaulniers Delahoussaye 01/14/1946 [id number]                                       Medication Assistance Referral  Referral From: [facility] RPh Waynard Reeds  Medication/Company: Stann Ore / [employer] Patient application portion:  Mailed Provider application portion: Faxed  to Dr. Elzie Rings Provider address/fax verified via: Office website  Medication/Company: Proventil HFA / [employer]ck Patient application portion: Mailed Provider application portion: Interoffice Mailed to Dr. Elzie Rings Provider address/fax verified via: Office website   Follow up:  Will follow up with patient in 7-10 business days to confirm application(s) have been received.  [name] Chana Bode [place] Certified Pharmacy Technician [place] Management Direct [phone]

## 2019-09-24 ENCOUNTER — Telehealth: Payer: Self-pay

## 2019-09-24 NOTE — Telephone Encounter (Signed)
Patient Assistance approved for Spiriva. Form needs to be signed by Dr. Loanne Drilling. Will place in her box in Pod C.

## 2019-09-30 ENCOUNTER — Telehealth: Payer: Self-pay | Admitting: Pulmonary Disease

## 2019-09-30 MED ORDER — INCRUSE ELLIPTA 62.5 MCG/INH IN AEPB: 1.0000 | INHALATION_SPRAY | Freq: Every day | RESPIRATORY_TRACT | 0 refills | Status: DC

## 2019-09-30 NOTE — Telephone Encounter (Signed)
Spoke with patient. She stated that she was calling to see if we had samples of Incruse. She is currently trying to get assistance for this medication as it has helped her breathing. Advised her that I was able to get 2 samples of Incruse for her. She is aware that I will leave them at the door for pickup tomorrow.   Nothing further needed.

## 2019-10-07 ENCOUNTER — Other Ambulatory Visit: Payer: Self-pay | Admitting: Pharmacy Technician

## 2019-10-07 NOTE — Patient Outreach (Signed)
[place] [facility]) Care Management  10/07/2019  Tiffany [name] 07/21/1946 [id number]   Received patient portion(s) of patient assistance application(s) for Spiriva and Proventil HFA.  Sent message to [name] at [facility] verifying office received provider portions of patient assistance applications. [name] states that applications are in Dr. Cordelia Pen box and that Dr. Loanne Drilling is to return to office on 11/12.  Will submit applications to companies once all documents have been obtained.  [name] Chana Bode [place] Certified Pharmacy Technician [place] Management Direct Dial:[phone]

## 2019-10-13 ENCOUNTER — Other Ambulatory Visit: Payer: Self-pay | Admitting: Pharmacy Technician

## 2019-10-13 NOTE — Patient Outreach (Signed)
[facility] [name]) Care Management  10/13/2019  Tiffany [name] Apr 05, 1946 [id number]   Received provider portion(s) of patient assistance application(s) for Spiriva Respimat ([employer]) and Proventil HFA (Merck). Faxed completed application and required documents into [place] for Spiriva Respimat.  Will mail Proventil HFA application to [employer] on 12-1 so that, if approved, patients approval will be valid until 11/25/20.  Will follow up with company(ies) in 5-7 business days to check status of application(s).  [name] Chana Bode [place] Certified Pharmacy Technician [place] Management Direct Dial:[phone]

## 2019-10-16 ENCOUNTER — Other Ambulatory Visit: Payer: Self-pay | Admitting: *Deleted

## 2019-10-20 ENCOUNTER — Other Ambulatory Visit: Payer: Self-pay | Admitting: Pharmacy Technician

## 2019-10-20 NOTE — Patient Outreach (Signed)
[place] [facility]) Care Management  10/20/2019  Tiffany [name] September 03, 1946 [id number]    Follow up call placed to [place] regarding patient assistance application(s) for Spiriva Respimat , Lucianne Muss confirms patient has been approved as of 11/19 until 11/25/2020!!!. 90 day supply of medication to be shipped out on 11/25 and to arrive at patients home in 7-10 business days.  Follow up:  Will follow up with patient in 10-14 business days to confirm medication has been received.  [name] Chana Bode [place] Certified Pharmacy Technician [place] Management Direct [phone]

## 2019-10-29 NOTE — Patient Outreach (Signed)
[facility]) Care Management  [place]  10/16/2019 Late entry  [place] 1946-09-05 [id number]  [place] telephone call to patient.  [place] compliance verified. Per patient she is doing good. Patient lives with husband. Patient stated that she is having some coughing non productive. Patient does get short of breath on exertion such as climbing stairs. Patient was not aware of the zones and action plan of COPD. Per patient she is using her medications as prescribed.  Patient stated she got her flu shot on 10/20. Patient has agreed to follow up outreach calls.   Encounter Medications:  Outpatient Encounter Medications as of 10/16/2019  Medication Sig  . albuterol (VENTOLIN HFA) 108 (90 Base) MCG/ACT inhaler Inhale 2 puffs into the lungs every 6 (six) hours as needed for wheezing or shortness of breath.  Marland Kitchen aspirin EC 81 MG tablet Take 1 tablet (81 mg total) by mouth daily.  . Calcium-Vitamin D (CALTRATE 600 PLUS-VIT D PO) Take 1 each by mouth daily.  . Cholecalciferol (VITAMIN D3) 125 MCG (5000 UT) CAPS Take 1 capsule by mouth daily.   . fluticasone (FLONASE) 50 MCG/ACT nasal spray Place 2 sprays into both nostrils daily. (Patient taking differently: Place 2 sprays into both nostrils as needed for allergies or rhinitis. )  . losartan (COZAAR) 25 MG tablet TAKE 2 TABLETS BY MOUTH EVERY DAY  . simvastatin (ZOCOR) 20 MG tablet TAKE 1 TABLET BY MOUTH EVERYDAY AT BEDTIME  . umeclidinium bromide (INCRUSE ELLIPTA) 62.5 MCG/INH AEPB Inhale 1 puff into the lungs daily.  Marland Kitchen umeclidinium bromide (INCRUSE ELLIPTA) 62.5 MCG/INH AEPB Inhale 1 puff into the lungs daily.   No facility-administered encounter medications on file as of 10/16/2019.     Functional Status:  In your present state of health, do you have any difficulty performing the following activities: 10/16/2019 12/03/2018  Hearing? N N  Vision? N N  Difficulty concentrating or making decisions? N N   Walking or climbing stairs? Y N  Comment Per patient gets short of breath walking up stairs or carrying in groceries -  Dressing or bathing? N N  Doing errands, shopping? N -  Preparing Food and eating ? N N  Using the Toilet? N N  In the past six months, have you accidently leaked urine? N N  Do you have problems with loss of bowel control? N N  Managing your Medications? N N  Managing your Finances? N N  Housekeeping or managing your Housekeeping? N N  Some recent data might be hidden    Fall/Depression Screening: Fall Risk  10/16/2019 12/03/2018 12/02/2017  Falls in the past year? 0 0 No  Number falls in past yr: 0 - -  Injury with Fall? 0 - -  Risk for fall due to : History of fall(s) - -  Follow up Falls evaluation completed - -   PHQ 2/9 Scores 10/16/2019 09/17/2019 12/03/2018 06/04/2018 12/02/2017 11/01/2016 08/31/2015  PHQ - 2 Score 0 0 0 0 0 0 0   THN CM Care Plan Problem One     Most Recent Value  Care Plan Problem One  Knowledge Deficit is Self Management of COPD  Role Documenting the Problem One  [place] for Problem One  Active  THN Long Term Goal   Patient will not have a COPD exacerbation within the next 90 days  THN Long Term Goal Start Date  10/16/19  Interventions for Problem One Long Term Goal  Rn discussed COPD exacerbation and zones and action plan. RN sent COPD packet. RN will follow up with further discussion  THN CM Short Term Goal #1   Patient will have a better understanding of purselip breathing within the next 30 days  THN CM Short Term Goal #1 Start Date  10/16/19  Interventions for Short Term Goal #1  RN discussed pursed lip breathing. RN sent picture sheet on purse lip breathing. RN will follow up with further discussion  THN CM Short Term Goal #2   Patient will verbalize having a better understanding of COPD zones and action plan within the next 30 days  THN CM Short Term Goal #2 Start Date  10/16/19  Interventions for Short Term Goal #2  RN  discussed zones and action plan of COPD. RN sent a magenet for refrigerator. RN will reiterate with each outreach call.      Assessment:  Patient is adhering to medication administration as per ordered Patient is using pandemic precautions Patient has flu shot 10/20 Patient not familiar with pursed lip breathing Patient will benefit from [place] telephonic outreach for education and support for COPD self management. Plan:  RN discussed pursed lip breathing RN sent picture sheet education on pursed lip breathing RN discussed zones and action plan of COPD RN sent COPD packet RN sent Welcome Packet RN sent assessment and barriers letter to PCP RN will follow up within the month of February   [place] Management [phone]

## 2019-11-12 ENCOUNTER — Other Ambulatory Visit: Payer: Self-pay | Admitting: Pharmacy Technician

## 2019-11-12 NOTE — Patient Outreach (Signed)
[facility] [facility] Care Management  11/12/2019  Tiffany [name] 03-08-1946 [id number]    Follow up call placed to [employer] regarding patient assistance application(s) for Proventil HFA , [name] confirms application has been received. Attestation form mailed out to patient on 12/10.    Successful call placed to patient regarding patient assistance receipt of attestation form from [employer] for Proventil HFA, HIPAA identifiers verified. Assisted [name] with completion of attestation form.  [name] confirms receiving a 90 day supply of Spiriva from [employer] patient assistance.  Follow up:  Will follow up with [employer] in 10-14 business days to confirm attestation fform has been received.  [name] Chana Bode [place] Certified Pharmacy Technician [place] Management Direct Dial:[phone]

## 2019-11-18 ENCOUNTER — Other Ambulatory Visit: Payer: Self-pay | Admitting: Family Medicine

## 2019-12-10 ENCOUNTER — Other Ambulatory Visit: Payer: Self-pay | Admitting: Pharmacy Technician

## 2019-12-10 NOTE — Patient Outreach (Signed)
[place] [facility]) Care Management  12/10/2019  Tiffany [name] Apr 14, 1946 [id number]   Follow up call placed to [employer] regarding patient assistance application(s) for Proventil HFA , Ezzard Flax confirms patient has been approved as of 1/12 until 11/25/2020. 90 day supply of medication to arrive at patient's home in 7-10 business days.   Follow up:  Will follow up with patient in 10-14 business days to confirm medication have been received.  [name] Chana Bode [place] Certified Pharmacy Technician [place] Management Direct Dial:[phone]

## 2019-12-16 ENCOUNTER — Encounter: Payer: Self-pay | Admitting: Pulmonary Disease

## 2019-12-16 ENCOUNTER — Ambulatory Visit: Payer: Medicare Other | Admitting: Pulmonary Disease

## 2019-12-16 ENCOUNTER — Other Ambulatory Visit: Payer: Self-pay

## 2019-12-16 VITALS — BP 134/64 | HR 71 | Temp 97.8°F | Ht 67.0 in | Wt 158.6 lb

## 2019-12-16 DIAGNOSIS — J449 Chronic obstructive pulmonary disease, unspecified: Secondary | ICD-10-CM

## 2019-12-16 NOTE — Patient Instructions (Addendum)
Severe COPD, GOLD Class B --CONTINUE Spiriva Respimat TWO puffs TWICE a day --CONTINUE Albuterol as needed  --CT chest lung screen scheduled on 12/2019  If you are interested in receiving your Covid-19 vaccine. You can visit [email or web address] and sign up through [facility].  Or if you want to schedule through [facility] Department you can visit Online at [email or web address] or By phone at [phone] (Option 2) from 8:00 am-5:00 pm, until all appointment slots have been filled.

## 2019-12-16 NOTE — Progress Notes (Signed)
Subjective:   [name] GENDER: female DOB: [birth year], MRN: [id number]   HPI  Chief Complaint  Patient presents with  . Follow-up    spiriva , feeling better, no new symptoms   Tiffany [name] is 74 year old female with severe COPD who presents for follow-up.  Since our last visit, she has discussed with pharmacy inhaler options and started on Spiriva. Unknown dose but is compliant with two puffs once a day. She can walk uphill with less shortness of breath since starting the inhaler. Still has mild wheezing. She is better able to handle tasks like carrying groceries. Still has difficulty with longer distances >273m. She is now using her rescue inhaler less often, decreasing from daily to every other day. Denies recent outpatient or inpatient exacerbations. She does not participate in regular exercise.  Social History: Former smoker.  Quit 08/2018.  50 pack years  I have personally reviewed patient's past medical/family/social history/allergies/current medications.  Past Medical History:  Diagnosis Date  . Abnormal PFT 09/02/14   moderate restrictive ventilatory defect.  Marland Kitchen BCC (basal cell carcinoma), face 2010   Dr. Allyson Sabal  . Colon polyp 2/05  . Diverticulosis 11/2010   seen on colonoscopy and air contrast BE  . Emphysema of lung ([place])   . Hyperlipidemia   . Hypertension    previously treated with meds, resolved  . MGUS (monoclonal gammopathy of unknown significance) 1999   prevously under care of Dr. Learta Codding  . Osteopenia    DEXA 01/31/2010 at [facility]; 2014 at [facility]  . 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 Tobacco abuse     No Known Allergies   Outpatient Medications Prior to Visit  Medication Sig Dispense Refill  . albuterol (VENTOLIN HFA) 108 (90 Base) MCG/ACT inhaler Inhale 2 puffs into the lungs every 6 (six) hours as needed for wheezing or shortness of breath. 18 g 0  . aspirin EC 81 MG tablet Take 1 tablet (81  mg total) by mouth daily.    . Calcium-Vitamin D (CALTRATE 600 PLUS-VIT D PO) Take 1 each by mouth daily.    . Cholecalciferol (VITAMIN D3) 125 MCG (5000 UT) CAPS Take 1 capsule by mouth daily.     . fluticasone (FLONASE) 50 MCG/ACT nasal spray Place 2 sprays into both nostrils daily. (Patient taking differently: Place 2 sprays into both nostrils as needed for allergies or rhinitis. ) 16 g 0  . losartan (COZAAR) 25 MG tablet TAKE 2 TABLETS BY MOUTH EVERY DAY 180 tablet 0  . simvastatin (ZOCOR) 20 MG tablet TAKE 1 TABLET BY MOUTH EVERYDAY AT BEDTIME 90 tablet 1  . umeclidinium bromide (INCRUSE ELLIPTA) 62.5 MCG/INH AEPB Inhale 1 puff into the lungs daily. 1 each 3  . umeclidinium bromide (INCRUSE ELLIPTA) 62.5 MCG/INH AEPB Inhale 1 puff into the lungs daily. 2 each 0   No facility-administered medications prior to visit.    Review of Systems  Constitutional: Negative for chills, diaphoresis, fever, malaise/fatigue and weight loss.  HENT: Negative for congestion.   Respiratory: Positive for shortness of breath and wheezing. Negative for cough, hemoptysis and sputum production.   Cardiovascular: Negative for chest pain, palpitations and leg swelling.   Objective:   Vitals:   12/16/19 1029  BP: 134/64  Pulse: 71  Temp: 97.8 F (36.6 C)  TempSrc: Temporal  SpO2: 96%  Weight: 158 lb 9.6 oz (71.9 kg)  Height: 5\' 7"  (1.702 m)  SpO2: 96 % O2 Device: None (Room air)  Physical Exam: General: Elderly-appearing, no acute distress HENT: [place], AT Eyes: EOMI, no scleral icterus Respiratory: Diminished breath sounds bilaterally.Marland Kitchen  No crackles, wheezing or rales Cardiovascular: RRR, -M/R/G, no JVD Extremities:-Edema,-tenderness Neuro: AAO x4, CNII-XII grossly intact Skin: Intact, no rashes or bruising Psych: Normal mood, normal affect  Chest imaging: CXR 09/10/2018- hyperinflated lung fields.  No acute infiltrate/effusion or edema CT Chest Lung 01/12/19 - Emphysema, unchanged pulmonary  nodules  PFT: 12/02/2017-severe obstructive defect. Ratio 58 FEV1 37 FVC 48  Imaging, labs and test noted above have been reviewed independently by me.    Assessment & Plan:   74 year old female with severe COPD who presents for follow-up. Improved on LAMA though not fully controlled. Will continue this regimen for now but will consider adding LABA at next visit if she continues to have symptoms. She is also very deconditioned. Encouraged her to increase exercise tolerance. Will consider pulmonary rehab when it reopens.  Severe COPD, GOLD Class B --CONTINUE Spiriva Respimat TWO puffs TWICE a day --CONTINUE Albuterol as needed  --CT chest lung screen scheduled on 12/2019  Vaccinations-UTD Immunization History  Administered Date(s) Administered  . Fluad Quad(high Dose 65+) 09/15/2019  . Influenza Split 10/16/2012  . Influenza, High Dose Seasonal PF 09/02/2013, 08/25/2014, 08/31/2015, 11/01/2016, 09/09/2017, 09/09/2018  . Pneumococcal Conjugate-13 08/25/2014  . Pneumococcal Polysaccharide-23 12/12/2009, 08/31/2015  . Tdap 12/12/2009    No orders of the defined types were placed in this encounter.  No orders of the defined types were placed in this encounter.   Return in about 6 months (around 06/14/2020), or with NP.   I have spent a total time of 20-minutes on the day of the appointment reviewing prior documentation, coordinating care and discussing medical diagnosis and plan with the patient/family. Imaging, labs and tests included in this note have been reviewed and interpreted independently by me.   Rodman Pickle, MD [facility] 12/16/2019 10:42 AM

## 2019-12-30 ENCOUNTER — Other Ambulatory Visit: Payer: Self-pay | Admitting: Pharmacy Technician

## 2019-12-30 NOTE — Patient Outreach (Signed)
[place] [facility]) Care Management  12/30/2019  Tiffany [name] 1946/06/05 [id number]    Successful call placed to patient regarding patient assistance medication delivery of Proventil HFA and Spiriva Respimat, HIPAA identifiers verified. Ms. Mahan confirms receiving both medications in the mail. Reviewed with her on obtaining refills for Proventil HFA ([employer]) and Spiriva ([employer]). Requested she contact me if she has any additional questions or runs into any issues.  Follow up:  Will route note to [place] for case closure  [name]. Chana Bode [place] Certified Pharmacy Technician [place] Management Direct [phone]

## 2019-12-31 ENCOUNTER — Other Ambulatory Visit: Payer: Self-pay | Admitting: Pharmacist

## 2019-12-31 NOTE — Patient Outreach (Signed)
[place] [facility]) Care Management  [place]   12/31/2019  Zamani Crocker [place]au 1946-02-16 [id number]  Patient has been approved for Spiriva and Proventil patient assistance program(s).  Patient has been instructed on how to order refills and renewal process for 2021.  Per review of recent pulmonary o/v, patient with improving systems on Spiriva and using less of rescue inhaler.  MD will consider adding LABA at next visit if patient still has symptoms.   [employer] makes both Spiriva (LAMA) and Stiolto (LABA / LAMA).  Since patient already approved for Spiriva, MD could easily switch to [employer] through same company for patient to continue to receive at no charge through patient assistance program.  New RX will need to be sent in if deemed clinically appropriate to substitute at next visit.  Will update provider on this option and then close case.     Plan: -Will close [facility] pharmacy case as goals of care have been met.  -I have provided my contact information if patient or family needs to reach out to me in the future.  -Thank you for allowing [facility] pharmacy to be involved in this patient's care.

## 2020-01-01 ENCOUNTER — Other Ambulatory Visit: Payer: Self-pay | Admitting: Pharmacist

## 2020-01-01 NOTE — Patient Outreach (Signed)
[facility] [facility]) Care Management  [place] 01/01/2020  Tiffany [name] 12/23/1945 [id number]  Communication received from Dr. Loanne Drilling, pulmonologist, who would like patient to switch from [place] to [facility].  New RX called into [employer] and new product will be shipped to patient in 1-2 weeks.    Successful call to Tiffany [name] to update her on change in inhaler.  We reviewed dosing and administration and how to call in refills.  Patient will stop Spiriva and transition to [facility] when it arrives.  No further questions at this time from patient.    Plan: Will keep [place] case closed.    Ralene Bathe, PharmD, [place] [phone]

## 2020-01-13 ENCOUNTER — Telehealth: Payer: Self-pay | Admitting: Internal Medicine

## 2020-01-13 ENCOUNTER — Other Ambulatory Visit: Payer: Self-pay | Admitting: *Deleted

## 2020-01-13 NOTE — Patient Outreach (Signed)
[place] [facility]) Care Management  [place]  01/13/2020   Tiffany [name] [birth year] [id number]  RN Health Coach telephone call to patient.  [employer] compliance verified. Per patient she is doing good. Patient has not received COVID vaccine but she sign up to get it. Patient stated that she has not received the new medication. RN explained one to two weeks from the pharmacy notes. Patient did receive educational material sent. Patient has agreed to further outreach calls.   Encounter Medications:  Outpatient Encounter Medications as of 01/13/2020  Medication Sig Note  . albuterol (VENTOLIN HFA) 108 (90 Base) MCG/ACT inhaler Inhale 2 puffs into the lungs every 6 (six) hours as needed for wheezing or shortness of breath.   Marland Kitchen aspirin EC 81 MG tablet Take 1 tablet (81 mg total) by mouth daily.   . Calcium-Vitamin D (CALTRATE 600 PLUS-VIT D PO) Take 1 each by mouth daily.   . Cholecalciferol (VITAMIN D3) 125 MCG (5000 UT) CAPS Take 1 capsule by mouth daily.    . fluticasone (FLONASE) 50 MCG/ACT nasal spray Place 2 sprays into both nostrils daily. (Patient taking differently: Place 2 sprays into both nostrils as needed for allergies or rhinitis. )   . losartan (COZAAR) 25 MG tablet TAKE 2 TABLETS BY MOUTH EVERY DAY   . simvastatin (ZOCOR) 20 MG tablet TAKE 1 TABLET BY MOUTH EVERYDAY AT BEDTIME   . Tiotropium Bromide-Olodaterol (STIOLTO RESPIMAT) 2.5-2.5 MCG/ACT AERS Inhale 2 puffs into the lungs daily. 01/01/2020: Getting from patient assistance program (BI)   No facility-administered encounter medications on file as of 01/13/2020.    Functional Status:  In your present state of health, do you have any difficulty performing the following activities: 10/16/2019  Hearing? N  Vision? N  Difficulty concentrating or making decisions? N  Walking or climbing stairs? Y  Comment Per patient gets short of breath walking up stairs or carrying in groceries  Dressing or bathing? N   Doing errands, shopping? N  Preparing Food and eating ? N  Using the Toilet? N  In the past six months, have you accidently leaked urine? N  Do you have problems with loss of bowel control? N  Managing your Medications? N  Managing your Finances? N  Housekeeping or managing your Housekeeping? N  Some recent data might be hidden    Fall/Depression Screening: Fall Risk  10/16/2019 12/03/2018 12/02/2017  Falls in the past year? 0 0 No  Number falls in past yr: 0 - -  Injury with Fall? 0 - -  Risk for fall due to : History of fall(s) - -  Follow up Falls evaluation completed - -   PHQ 2/9 Scores 10/16/2019 09/17/2019 12/03/2018 06/04/2018 12/02/2017 11/01/2016 08/31/2015  PHQ - 2 Score 0 0 0 0 0 0 0   THN CM Care Plan Problem One     Most Recent Value  Care Plan Problem One  Knowledge Deficit is Self Management of COPD  Role Documenting the Problem One  [place] for Problem One  Active  THN Long Term Goal   Patient will not have a COPD exacerbation within the next 90 days  THN Long Term Goal Start Date  01/13/20  Interventions for Problem One Long Term Goal  RN reiterated COPD zones and action plan. RN will continue to reiterate each outreach especially during pandemic.  THN CM Short Term Goal #1   Patient will have a better understanding of purselip breathing within the  next 30 days  THN CM Short Term Goal #1 Met Date  01/13/20  [facility] CM Short Term Goal #2   Patient will verbalize having a better understanding of COPD zones and action plan within the next 30 days  THN CM Short Term Goal #2 Met Date  01/13/20  [facility] CM Short Term Goal #3  Patient will verbalize having a betterunderstanding of COPD and activity  THN CM Short Term Goal #3 Start Date  01/13/20  Interventions for Short Tern Goal #3  RN discussed rest periods. RN sent educationall materialon COPD and rest.RN willfollow up for further discussion  THN CM Short Term Goal #4  Patient will verbalize having a better  understanding of COPD and eatingplan within thenext 30 days  THN CM Short Term Goal #4 Start Date  01/13/20  Interventions for Short Term Goal #4  RN discussed frequent small meals for copd. RN sent education material on COPD and eating plan. RN will follow up further discussion      Assessment:  Patient received educational material Patient has not signed up yet for the COVID vaccine Patient is in the green zone Patient will continue to  benefit from Health Coach telephonic outreach for education and support for COPD self management.  Plan:  RN discussed zones and action plan RN sent 2021 Calendar book Patient goes every 6 months for CT of lungs Next appointment is Friday February 9,2021 RN sent educational material on COPD eating plan RN sent educational material on COPD and activity RN will follow up within the month of May   [place] Management [phone]

## 2020-01-13 NOTE — Telephone Encounter (Signed)
Noted in appointment desk

## 2020-01-13 NOTE — Telephone Encounter (Signed)
Thanks.  She doesn't need to be fasting (can draw nonfasting labs at visit, if needed).

## 2020-01-13 NOTE — Telephone Encounter (Signed)
Pt has rescheduled her awv/cpe due to weather to march 17th @ 2pm

## 2020-01-14 ENCOUNTER — Ambulatory Visit: Payer: Medicare Other | Admitting: Family Medicine

## 2020-01-15 ENCOUNTER — Inpatient Hospital Stay: Admission: RE | Admit: 2020-01-15 | Payer: Medicare Other | Source: Ambulatory Visit

## 2020-01-26 ENCOUNTER — Other Ambulatory Visit: Payer: Self-pay

## 2020-01-26 ENCOUNTER — Ambulatory Visit (INDEPENDENT_AMBULATORY_CARE_PROVIDER_SITE_OTHER)
Admission: RE | Admit: 2020-01-26 | Discharge: 2020-01-26 | Disposition: A | Discharge status: Home / Self Care | Payer: Medicare Other | Source: Ambulatory Visit | Attending: Acute Care | Admitting: Acute Care

## 2020-01-26 ENCOUNTER — Encounter (INDEPENDENT_AMBULATORY_CARE_PROVIDER_SITE_OTHER): Payer: Self-pay

## 2020-01-26 DIAGNOSIS — Z122 Encounter for screening for malignant neoplasm of respiratory organs: Secondary | ICD-10-CM

## 2020-01-26 DIAGNOSIS — Z87891 Personal history of nicotine dependence: Secondary | ICD-10-CM | POA: Diagnosis not present

## 2020-01-28 ENCOUNTER — Encounter: Payer: Self-pay | Admitting: *Deleted

## 2020-01-28 ENCOUNTER — Other Ambulatory Visit: Payer: Self-pay | Admitting: Family Medicine

## 2020-01-28 DIAGNOSIS — E7800 Pure hypercholesterolemia, unspecified: Secondary | ICD-10-CM

## 2020-01-28 NOTE — Progress Notes (Signed)
Please call patient and let them  know their  low dose Ct was read as a Lung RADS 2: nodules that are benign in appearance and behavior with a very low likelihood of becoming a clinically active cancer due to size or lack of growth. Recommendation per radiology is for a repeat LDCT in 12 months. .Please let them  know we will order and schedule their  annual screening scan for 01/2021. Please let them  know there was notation of CAD on their  scan.  Please remind the patient  that this is a non-gated exam therefore degree or severity of disease  cannot be determined. Please have them  follow up with their PCP regarding potential risk factor modification, dietary therapy or pharmacologic therapy if clinically indicated. Pt.  is  currently on statin therapy. Please place order for annual  screening scan for  01/2021 and fax results to PCP. Thanks so much.

## 2020-01-29 ENCOUNTER — Other Ambulatory Visit: Payer: Self-pay | Admitting: *Deleted

## 2020-01-29 DIAGNOSIS — Z87891 Personal history of nicotine dependence: Secondary | ICD-10-CM

## 2020-02-03 NOTE — Patient Instructions (Addendum)
HEALTH MAINTENANCE RECOMMENDATIONS:  It is recommended that you get at least 30 minutes of aerobic exercise at least 5 days/week (for weight loss, you may need as much as 60-90 minutes). This can be any activity that gets your heart rate up. This can be divided in 10-15 minute intervals if needed, but try and build up your endurance at least once a week.  Weight bearing exercise is also recommended twice weekly.  Eat a healthy diet with lots of vegetables, fruits and fiber.  "Colorful" foods have a lot of vitamins (ie green vegetables, tomatoes, red peppers, etc).  Limit sweet tea, regular sodas and alcoholic beverages, all of which has a lot of calories and sugar.  Up to 1 alcoholic drink daily may be beneficial for women (unless trying to lose weight, watch sugars).  Drink a lot of water.  Calcium recommendations are 1200-1500 mg daily (1500 mg for postmenopausal women or women without ovaries), and vitamin D 1000 IU daily.  This should be obtained from diet and/or supplements (vitamins), and calcium should not be taken all at once, but in divided doses.  Monthly self breast exams and yearly mammograms for women over the age of 50 is recommended.  Sunscreen of at least SPF 30 should be used on all sun-exposed parts of the skin when outside between the hours of 10 am and 4 pm (not just when at beach or pool, but even with exercise, golf, tennis, and yard work!)  Use a sunscreen that says "broad spectrum" so it covers both UVA and UVB rays, and make sure to reapply every 1-2 hours.  Remember to change the batteries in your smoke detectors when changing your clock times in the spring and fall. Carbon monoxide detectors are recommended for your home.  Use your seat belt every time you are in a car, and please drive safely and not be distracted with cell phones and texting while driving.   Ms. Dever , Thank you for taking time to come for your Medicare Wellness Visit. I appreciate your ongoing  commitment to your health goals. Please review the following plan we discussed and let me know if I can assist you in the future.   This is a list of the screening recommended for you and due dates:  Health Maintenance  Topic Date Due  . Tetanus Vaccine  12/13/2019  . Mammogram  03/08/2019  . Colon Cancer Screening  11/28/2020  . Flu Shot  Completed  . DEXA scan (bone density measurement)  Completed  .  Hepatitis C: One time screening is recommended by Center for Disease Control  (CDC) for  adults born from [birth year] through 1964.   Completed  . Pneumonia vaccines  Completed   You are due for Tdap (tetanus) booster.  This is covered by Medicare part D, and you need to get this from the pharmacy.  You should separate any vaccines from each other (including COVID) by at least 2 weeks.  You are past due for mammogram (they are recommended yearly). You are going to be due for bone density test in 02/2020 (okay to wait and have both done at the same time). Call the [facility] to schedule both of them.  You should wait 4-6 weeks after the COVID vaccine before getting your mammogram (or get the mammogram before your vaccine)  I recommend getting the new shingles vaccine (Shingrix). You will need to get this from the pharmacy rather than our office, as it is covered by Medicare Part  D.  It is a series of 2 injections, spaced 2 months apart. You need to wait 4 weeks after any other vaccines before starting this.  COVID vaccine is recommended. Try and get this first, then you can get the tetanus and shingles vaccines after. Please look at [email or web address], or through [facility]. COVID-19 Vaccine Information can be found at: ShippingScam.co.uk For questions related to vaccine distribution or appointments, please email vaccine@[place] .com or call [phone].  [facility] also has them available (certain ones--you need to register online). And of  course there is the mass vaccination site at the [facility] now. Between all of these sources, you should be able to schedule a visit.   I believe you previously reported being notified from [facility] that you were due for colonoscopy (the date above is 10 years from your last colonoscopy, which may not be the recommended screening interval per GI.). Call [facility] to see if you are truly due now or not, and if so, schedule appointment.  Please schedule a routine eye exam.  Consider trying a supplement like osteo-biflex (glucosamine and chondoitin) daily to see if this help with arthritis pain. You can also use tylenol as needed for your knee pain. Avoid lots of hills and stairs, but keep the knee moving.  Please fill out the paperwork for your living will and healthcare power of attorney as we discussed.  Once it is notarized, we would like a copy so that it can be scanned into your medical chart.

## 2020-02-03 NOTE — Progress Notes (Signed)
Chief Complaint  Patient presents with  . Medicare Wellness    fasting AWV with pelvic exam. Has not gotten any vaccines from pharmacy or COVID vaccine. No concerns.     Tiffany [name] is a 74 y.o. female who presents for annual physical exam, Medicare wellness visit and follow-up on chronic medical conditions.    Concerns today: Left knee pain only with stairs. No injury or fall.  No swelling, locking or giving way.  She has had some L knee pain for a while, just getting a little worse.  Hypertension. Sheis compliant with takinglosartan 50mg .(currently taking two 25mg  tablets related to availability). BP's have been good at the pulmonary clinic.  She denies side effects, headaches, chest pain, palpitations, edema, shortness of breath (other than from COPD). Slight chronic smoker's cough.  Hyperlipidemia: She is compliant with taking simvastatin. Denies side effect. Tries to follow lowfat, low cholesterol diet She was noted to have aortic atherosclerosis on CT for lung cancer screening. Other cardiac risk factors include HTN, former h/o smoking, which is why statin was started. She declined the stress test previously recommended by cardiologist, Dr. Saunders Revel. She has chronic DOE, which may also be related to her COPD.Calcified atherosclerotic plaque was noted in R coronary artery on CT scan (incidentally noted on lung CA screen) 12/2017. She underwent carotid US in 06/2019: IMPRESSION: Color duplex indicates minimal heterogeneous and calcified plaque, with no hemodynamically significant stenosis by duplex criteria in the extracranial cerebrovascular circulation.  Lab Results  Component Value Date   CHOL 146 06/22/2019   HDL 54 06/22/2019   LDLCALC 75 06/22/2019   TRIG 86 06/22/2019   CHOLHDL 2.7 06/22/2019   Osteopenia--She previously took bisphosphonates (changed from fosamax (due to nausea) to Actonel, as well as Boniva, both of which she tolerated but stopped due to cost issues,  possibly some GI side effects (heartburn--she stopped without contacting us)).She takes a calcium +D supplement daily. She doesn't get any regular weight-bearing exercise.She quit smoking 08/2018. 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  We did not recommend restarting medication, but encouraged Ca, D (has h/o deficiency, see below), tobacco cessation(which she has done)and weight-bearing exercise, and to recheck in 2 years.  Vitamin D deficiency. Last level was 29.1 in 05/2019, when taking 5000 IU daily.  Prior level was 24.6 in 11/2018, when she was prescribed and 8 week course of prescription vitamin D. She is currently taking 5000 IU daily  Tobacco useand COPD--Shequit smoking 09/10/18. Her shortness of breath is much better since she quit; her DOE withgoing up and down the stairs to do her laundry, and in carrying her groceries in (up a hill to her house) has improved also with medications.  She remains under the care of pulmonary, last saw Dr. Loanne Drilling in 11/2019.  Her Spiriva was since changed to [employer]. She is still finishing out her supply of Spiriva, hasn't started the Justice yet.  She was given proventil prescription, using it before bedtime most nights, and prn. She reports compliance with meds, and her breathing remains improved.  She gets regular lung cancer screening CT's, last was 01/26/2020: IMPRESSION: Lung-RADS 2, benign appearance or behavior. Continue annual screening with low-dose chest CT without contrast in 12 months. Aortic Atherosclerosis (ICD10-I70.0) and Emphysema (ICD10-J43.9).  H/o elevated TSH--TSH of 4.75 in12/2017,with normal free T4; recheck in June2018was stable(4.44).TSH was subsequently normal.She deniesany  changes in hair/skin/bowels/energy/temperatureintolerance.Notes some hair loss, nothing new  recently, just with age.  Lab Results  Component Value Date   TSH 3.700 12/03/2018    Immunization History  Administered Date(s) Administered  . Fluad Quad(high Dose 65+) 09/15/2019  . Influenza Split 10/16/2012  . Influenza, High Dose Seasonal PF 09/02/2013, 08/25/2014, 08/31/2015, 11/01/2016, 09/09/2017, 09/09/2018  . Pneumococcal Conjugate-13 08/25/2014  . Pneumococcal Polysaccharide-23 12/12/2009, 08/31/2015  . Tdap 12/12/2009   Last Pap smear: s/p hyst in 20's  Last mammogram:02/2018 (didn't get last year related to [place] concerns) Last colonoscopy: 11/2010 Dr. Wynetta Emery. She got a letter a couple of years ago stating she was due, but didn't go. Last DEXA:02/2018 Dentist: doesn't go, has dentures  Ophtho: 2 years ago Exercise: Not much.     Other doctors caring for patient include: Dermatologist: Dr. Ledell Peoples PA GI: Dr. Wynetta Emery [facility]) Ophtho: Dr. Wynetta Emery ophtho (specialist): Dr. Edilia Bo Pulmonary: Dr. Loanne Drilling  Depression screen negative PHQ-2 Fall screen negative Functional status survey notable for occasional L knee pain, sometimes forgets a word here or there, no significant memory concerns. Mini-Cog screen normal. See full screens in Epic  End of Life Discussion: Patient does not havea living will and medical power of attorney. She was given paperwork in the past, hasn't returned it. She still has it at home.  PMH, PSH, SH and FH were reviewed and updated  Outpatient Encounter Medications as of 02/04/2020  Medication Sig Note  . albuterol (VENTOLIN HFA) 108 (90 Base) MCG/ACT inhaler Inhale 2 puffs into the lungs every 6 (six) hours as needed for wheezing or shortness of breath.   Marland Kitchen aspirin EC 81 MG tablet Take 1 tablet (81 mg total) by mouth daily.   . Calcium-Vitamin D (CALTRATE 600 PLUS-VIT D PO) Take 1 each by mouth daily.   . Cholecalciferol (VITAMIN D3) 125 MCG (5000 UT) CAPS Take 1 capsule by mouth daily.    Marland Kitchen losartan (COZAAR) 25 MG tablet TAKE 2  TABLETS BY MOUTH EVERY DAY   . simvastatin (ZOCOR) 20 MG tablet TAKE 1 TABLET BY MOUTH EVERYDAY AT BEDTIME   . tiotropium (SPIRIVA) 18 MCG inhalation capsule Place 18 mcg into inhaler and inhale daily. 02/04/2020: Finishing out her supply of spiriva before changing to [employer]  . fluticasone (FLONASE) 50 MCG/ACT nasal spray Place 2 sprays into both nostrils daily. (Patient not taking: Reported on 02/04/2020)   . Tiotropium Bromide-Olodaterol (STIOLTO RESPIMAT) 2.5-2.5 MCG/ACT AERS Inhale 2 puffs into the lungs daily. 01/01/2020: Getting from patient assistance program (BI)   No facility-administered encounter medications on file as of 02/04/2020.   No Known Allergies  ROS: The patient denies anorexia, fever, headaches, vision changes, decreased hearing, ear pain, sore throat, breast concerns, chest pain, dizziness, syncope, swelling, nausea, vomiting, diarrhea, constipation, abdominal pain, melena, hematochezia, hematuria, incontinence, dysuria, vaginal bleeding, discharge, odor or itch, genital lesions, weakness, tremor, suspicious skin lesions, depression, anxiety, abnormal bleeding/bruising, or enlarged lymph nodes.  Occasional night sweats, no hot flashes during the day  Some vaginal dryness  Some intermittent itchy ears (ongoing/chronic, sometimes wakes her up at night); currently not itchy SOB only with stairs and carrying groceries uphill, overall much improved. Some smoker's cough -- thick, clear phlegm. This is much less frequent since she quit smoking. Has heartburn with certain foods, such as pizza. Some L knee pain, mainly with stairs, per HPI; denies other joint pains.    PHYSICAL EXAM:  BP 120/70   Pulse 72   Temp 97.7 F (36.5 C) (Other (  Comment))   Ht 5\' 7"  (1.702 m)   Wt 160 lb 3.2 oz (72.7 kg)   BMI 25.09 kg/m   Wt Readings from Last 3 Encounters:  02/04/20 160 lb 3.2 oz (72.7 kg)  12/16/19 158 lb 9.6 oz (71.9 kg)  09/15/19 155 lb (70.3 kg)    General Appearance:   Alert, cooperative, no distress, appears stated age. Speaks easily in full sentences, in no distress  Head:  Normocephalic, without obvious abnormality, atraumatic   Eyes:  PERRL, conjunctiva/corneas clear, EOM's intact, fundi benign   Ears:  Normal TM's and external ear canals. Small amount of dry, nonobstructive cerumen on the right.  Nose:  Not examined, wearing mask due to COVID-19 pandemic  Throat:  Not examined, wearing mask due to COVID-19 pandemic  Neck:  Supple, no lymphadenopathy; thyroid: no enlargement/ tenderness/nodules; no carotid bruit or JVD   Back:  Spine nontender, no curvature, ROM normal, no CVA tenderness   Lungs:  Decreased air movement, but otherwise clear.  No wheezes, rales, ronchi  Chest Wall:  No tenderness or deformity   Heart:  Regular rate and rhythm, S1 and S2 normal, no murmur, rub or gallop   Breast Exam:  No masses or nipple discharge or inversion. No axillary lymphadenopathy. Slightly more fibroglandular changes are noted on the left breast, which is also mildly tender.  This is noted in the L UOQ and at the 7-8 o'clock position on L.  Abdomen:  Soft, non-tender, nondistended, normoactive bowel sounds, no masses, no hepatosplenomegaly   Genitalia:  Normal external genitalia without lesions. BUS and vagina normal, some atrophic changes; No abnormal vaginal discharge. Uterus absent. No adnexal masses or tenderness. Pap not performed   Rectal:  Normal tone, no masses or tenderness; guaiac negative stool   Extremities:  No clubbing, cyanosis or edema.  L knee--very slight swelling, nontender, no warmth.  Pulses:  Slightly diminished, symmetric all extremities Feet are warm ,brisk cap refill  Skin:  Turgor normal, no rashes. Significant actinic changes to skin. Skin is very dry on her legs. Small dry/hyperkeratotic patch to R wrist (treated by derm, somewhat better).  Some mild bruising/purpura on arms.  Lymph nodes:  Cervical,  supraclavicular, and axillary nodes normal   Neurologic:  CNII-XII intact, normal strength, sensation and gait; reflexes 2+ and symmetric throughout    Psych: Normal mood, affect, hygiene and grooming   ASSESSMENT/PLAN:  Annual physical exam - Plan: Comprehensive metabolic panel, CBC with Differential/Platelet  Medicare annual wellness visit, subsequent  Essential hypertension - well controlled on current regimen, continue - Plan: losartan (COZAAR) 50 MG tablet, CBC with Differential/Platelet  Vitamin D deficiency - continue daily supplement - Plan: DG Bone Density  Chronic obstructive pulmonary disease, unspecified COPD type ([place]) - Discussed spiriva vs stiolto, as well as albuterol, and answered questions.  Aortic atherosclerosis (HCC) - cont statin, ASA  Osteopenia, unspecified location - repeat DEXA due 02/2020.  Discussed Ca, D and weight-bearing exercises - Plan: DG Bone Density  Chronic respiratory failure with hypoxia (HCC)  Senile purpura (HCC)  Medication monitoring encounter - Plan: Comprehensive metabolic panel, CBC with Differential/Platelet  Pure hypercholesterolemia - plan to restart statin.  Unsure if she will go for stress test as recommended, so will try and minimize risks - Plan: simvastatin (ZOCOR) 20 MG tablet  Colon cancer screening - reportedly past due; encouraged her to contact [facility] to verify if due now, and schedule  Immunization counseling - counseled re: risks/side effects and timing of COVID  vaccine, shingrix, and TdaP.  rx given for Tdap, to get from pharm  Left knee pain, unspecified chronicity - suspect related to OA.  Can try glucosamine/chondroitin.  Discussed exercises which will not exacerbate. x-ray if worsening   Discussed monthly self breast exams and yearly mammograms (past due, reminded to schedule); at least 30 minutes of aerobic activity at least 5 days/week, weight-bearing exercise 2x/wk; proper sunscreen use reviewed;  healthy diet, including goals of calcium and vitamin D intake and alcohol recommendations (less than or equal to 1 drink/day) reviewed; regular seatbelt use; changing batteries in smoke detectors. Immunization recommendations discussed-- continue yearly flu shots.  Tdap and Shingrix recommended, to get from pharmacy. Risks/SE reviewed. COVID vaccine recommended. Colonoscopy recommendations reviewed, past due per her report. We previously discussed Cologuard vs colonoscopy, and she preferred colonoscopy, advised to contact [facility] to schedule (and confirm that she is due).  Full Code, Full care. Doesn't want prolonged measures. Discussed importance/reasons for completing Living Will and healthcare POA, encouraged to fill out and give [place] a copy after notarized. MOST form reviewed and signed.  F/u 6 months for med check  Medicare Attestation I have personally reviewed: The patient's medical and social history Their use of alcohol, tobacco or illicit drugs Their current medications and supplements The patient's functional ability including ADLs,fall risks, home safety risks, cognitive, and hearing and visual impairment Diet and physical activities Evidence for depression or mood disorders  The patient's weight, height, BMI have been recorded in the chart.  I have made referrals, counseling, and provided education to the patient based on review of the above and I have provided the patient with a written personalized care plan for preventive services.

## 2020-02-04 ENCOUNTER — Ambulatory Visit (INDEPENDENT_AMBULATORY_CARE_PROVIDER_SITE_OTHER): Payer: Medicare Other | Admitting: Family Medicine

## 2020-02-04 ENCOUNTER — Encounter: Payer: Self-pay | Admitting: Family Medicine

## 2020-02-04 ENCOUNTER — Other Ambulatory Visit: Payer: Self-pay

## 2020-02-04 ENCOUNTER — Other Ambulatory Visit: Payer: Self-pay | Admitting: Family Medicine

## 2020-02-04 VITALS — BP 120/70 | HR 72 | Temp 97.7°F | Ht 67.0 in | Wt 160.2 lb

## 2020-02-04 DIAGNOSIS — Z1231 Encounter for screening mammogram for malignant neoplasm of breast: Secondary | ICD-10-CM

## 2020-02-04 DIAGNOSIS — E559 Vitamin D deficiency, unspecified: Secondary | ICD-10-CM

## 2020-02-04 DIAGNOSIS — Z7189 Other specified counseling: Secondary | ICD-10-CM

## 2020-02-04 DIAGNOSIS — M25562 Pain in left knee: Secondary | ICD-10-CM

## 2020-02-04 DIAGNOSIS — J449 Chronic obstructive pulmonary disease, unspecified: Secondary | ICD-10-CM | POA: Diagnosis not present

## 2020-02-04 DIAGNOSIS — I700 Atherosclerosis of aorta: Secondary | ICD-10-CM

## 2020-02-04 DIAGNOSIS — I10 Essential (primary) hypertension: Secondary | ICD-10-CM

## 2020-02-04 DIAGNOSIS — Z5181 Encounter for therapeutic drug level monitoring: Secondary | ICD-10-CM | POA: Diagnosis not present

## 2020-02-04 DIAGNOSIS — Z0000 Encounter for general adult medical examination without abnormal findings: Secondary | ICD-10-CM

## 2020-02-04 DIAGNOSIS — Z1211 Encounter for screening for malignant neoplasm of colon: Secondary | ICD-10-CM | POA: Diagnosis not present

## 2020-02-04 DIAGNOSIS — M8580 Other specified disorders of bone density and structure, unspecified site: Secondary | ICD-10-CM

## 2020-02-04 DIAGNOSIS — D692 Other nonthrombocytopenic purpura: Secondary | ICD-10-CM

## 2020-02-04 DIAGNOSIS — J9611 Chronic respiratory failure with hypoxia: Secondary | ICD-10-CM

## 2020-02-04 DIAGNOSIS — Z7185 Encounter for immunization safety counseling: Secondary | ICD-10-CM

## 2020-02-04 DIAGNOSIS — E7800 Pure hypercholesterolemia, unspecified: Secondary | ICD-10-CM

## 2020-02-04 MED ORDER — SIMVASTATIN 20 MG PO TABS: 20.0000 mg | ORAL_TABLET | Freq: Every day | ORAL | 1 refills | Status: DC

## 2020-02-04 MED ORDER — LOSARTAN POTASSIUM 50 MG PO TABS: 50.0000 mg | ORAL_TABLET | Freq: Every day | ORAL | 1 refills | Status: DC

## 2020-02-05 ENCOUNTER — Encounter: Payer: Self-pay | Admitting: Family Medicine

## 2020-02-05 LAB — CBC WITH DIFFERENTIAL/PLATELET
Basophils Absolute: 0 10*3/uL (ref 0.0–0.2)
Basos: 1 %
EOS (ABSOLUTE): 0.1 10*3/uL (ref 0.0–0.4)
Eos: 3 %
Hematocrit: 44.2 % (ref 34.0–46.6)
Hemoglobin: 15 g/dL (ref 11.1–15.9)
Immature Grans (Abs): 0 10*3/uL (ref 0.0–0.1)
Immature Granulocytes: 0 %
Lymphocytes Absolute: 1.2 10*3/uL (ref 0.7–3.1)
Lymphs: 25 %
MCH: 31.5 pg (ref 26.6–33.0)
MCHC: 33.9 g/dL (ref 31.5–35.7)
MCV: 93 fL (ref 79–97)
Monocytes Absolute: 0.6 10*3/uL (ref 0.1–0.9)
Monocytes: 11 %
Neutrophils Absolute: 3 10*3/uL (ref 1.4–7.0)
Neutrophils: 60 %
Platelets: 230 10*3/uL (ref 150–450)
RBC: 4.76 x10E6/uL (ref 3.77–5.28)
RDW: 11.8 % (ref 11.7–15.4)
WBC: 5 10*3/uL (ref 3.4–10.8)

## 2020-02-05 LAB — COMPREHENSIVE METABOLIC PANEL
ALT: 17 IU/L (ref 0–32)
AST: 23 IU/L (ref 0–40)
Albumin/Globulin Ratio: 1.8 (ref 1.2–2.2)
Albumin: 4.9 g/dL — ABNORMAL HIGH (ref 3.7–4.7)
Alkaline Phosphatase: 124 IU/L — ABNORMAL HIGH (ref 39–117)
BUN/Creatinine Ratio: 15 (ref 12–28)
BUN: 14 mg/dL (ref 8–27)
Bilirubin Total: 0.7 mg/dL (ref 0.0–1.2)
CO2: 22 mmol/L (ref 20–29)
Calcium: 9.1 mg/dL (ref 8.7–10.3)
Chloride: 98 mmol/L (ref 96–106)
Creatinine, Ser: 0.92 mg/dL (ref 0.57–1.00)
GFR calc Af Amer: 72 mL/min/{1.73_m2} (ref >59)
GFR calc non Af Amer: 62 mL/min/{1.73_m2} (ref >59)
Globulin, Total: 2.8 g/dL (ref 1.5–4.5)
Glucose: 93 mg/dL (ref 65–99)
Potassium: 4.2 mmol/L (ref 3.5–5.2)
Sodium: 137 mmol/L (ref 134–144)
Total Protein: 7.7 g/dL (ref 6.0–8.5)

## 2020-02-10 ENCOUNTER — Ambulatory Visit: Payer: Medicare Other | Admitting: Family Medicine

## 2020-04-12 ENCOUNTER — Other Ambulatory Visit: Payer: Self-pay | Admitting: *Deleted

## 2020-04-12 NOTE — Patient Outreach (Signed)
[facility]) Care Management  04/12/2020  Tiffany [name] July 07, 1946 [id number]  RN Health Coach attempted follow up outreach call to patient.  Patient was unavailable. HIPPA compliance voicemail message left with return callback number.  Plan: RN will call patient again within 30 days.  [place] [facility] [phone]

## 2020-04-13 ENCOUNTER — Ambulatory Visit
Admission: RE | Admit: 2020-04-13 | Discharge: 2020-04-13 | Disposition: A | Discharge status: Home / Self Care | Payer: Medicare Other | Source: Ambulatory Visit | Attending: Family Medicine | Admitting: Family Medicine

## 2020-04-13 ENCOUNTER — Other Ambulatory Visit: Payer: Self-pay

## 2020-04-13 ENCOUNTER — Other Ambulatory Visit: Payer: Medicare Other

## 2020-04-13 ENCOUNTER — Ambulatory Visit: Payer: Medicare Other

## 2020-04-13 DIAGNOSIS — M8580 Other specified disorders of bone density and structure, unspecified site: Secondary | ICD-10-CM

## 2020-04-13 DIAGNOSIS — Z780 Asymptomatic menopausal state: Secondary | ICD-10-CM | POA: Diagnosis not present

## 2020-04-13 DIAGNOSIS — Z1231 Encounter for screening mammogram for malignant neoplasm of breast: Secondary | ICD-10-CM | POA: Diagnosis not present

## 2020-04-13 DIAGNOSIS — M8589 Other specified disorders of bone density and structure, multiple sites: Secondary | ICD-10-CM | POA: Diagnosis not present

## 2020-04-13 DIAGNOSIS — E559 Vitamin D deficiency, unspecified: Secondary | ICD-10-CM

## 2020-05-10 ENCOUNTER — Other Ambulatory Visit: Payer: Self-pay | Admitting: *Deleted

## 2020-05-10 NOTE — Patient Outreach (Signed)
[facility]) Care Management  [place]  05/10/2020   Tiffany [name] June 21, 1946 [id number]  RN Health Coach telephone call to patient.  Hipaa compliance verified. Per patient she is doing good. Patient stated that she gets short of breath when she is bringing groceries in.Patient is not having any coughing or sputum at this time.  Patient stated that she is taking stiolto and using her albuterol inhaler. Patient stated she would like to have a nebulizer. Patient will talk to pulmonologist next visit about the nebulizer. Per patient she has forgotten how to reorder her stiolto. RN will discuss with pharmacy. Per patient she has been working in the yard but does not have an exercise routine. Patient has agreed to follow up outreach calls.    Encounter Medications:  Outpatient Encounter Medications as of 05/10/2020  Medication Sig Note  . albuterol (VENTOLIN HFA) 108 (90 Base) MCG/ACT inhaler Inhale 2 puffs into the lungs every 6 (six) hours as needed for wheezing or shortness of breath.   . Tiotropium Bromide-Olodaterol (STIOLTO RESPIMAT) 2.5-2.5 MCG/ACT AERS Inhale 2 puffs into the lungs daily. 01/01/2020: Getting from patient assistance program (BI)  . aspirin EC 81 MG tablet Take 1 tablet (81 mg total) by mouth daily.   . Calcium-Vitamin D (CALTRATE 600 PLUS-VIT D PO) Take 1 each by mouth daily.   . Cholecalciferol (VITAMIN D3) 125 MCG (5000 UT) CAPS Take 1 capsule by mouth daily.    . fluticasone (FLONASE) 50 MCG/ACT nasal spray Place 2 sprays into both nostrils daily. (Patient not taking: Reported on 02/04/2020)   . losartan (COZAAR) 50 MG tablet Take 1 tablet (50 mg total) by mouth daily.   . simvastatin (ZOCOR) 20 MG tablet Take 1 tablet (20 mg total) by mouth daily.   Marland Kitchen tiotropium (SPIRIVA) 18 MCG inhalation capsule Place 18 mcg into inhaler and inhale daily. (Patient not taking: Reported on 05/10/2020) 02/04/2020: Finishing out her supply of spiriva before changing  to Stiolto   No facility-administered encounter medications on file as of 05/10/2020.    Functional Status:  In your present state of health, do you have any difficulty performing the following activities: 02/04/2020 10/16/2019  Hearing? N N  Vision? N N  Difficulty concentrating or making decisions? Y N  Comment just some "old age" memory stuff, like remembering a word here or there -  Walking or climbing stairs? Y Y  Comment left knee bothers her at times Per patient gets short of breath walking up stairs or carrying in groceries  Dressing or bathing? N N  Doing errands, shopping? N N  Preparing Food and eating ? N N  Using the Toilet? N N  In the past six months, have you accidently leaked urine? N N  Do you have problems with loss of bowel control? N N  Managing your Medications? N N  Managing your Finances? N N  Housekeeping or managing your Housekeeping? N N  Some recent data might be hidden    Fall/Depression Screening: Fall Risk  02/04/2020 10/16/2019 12/03/2018  Falls in the past year? 0 0 0  Number falls in past yr: - 0 -  Injury with Fall? - 0 -  Risk for fall due to : - History of fall(s) -  Follow up - Falls evaluation completed -   PHQ 2/9 Scores 02/04/2020 10/16/2019 09/17/2019 12/03/2018 06/04/2018 12/02/2017 11/01/2016  PHQ - 2 Score 0 0 0 0 0 0 0   THN CM Care Plan Problem  One     Most Recent Value  Care Plan Problem One Knowledge Deficit is Self Management of COPD  Role Documenting the Problem One Health Coach  Care Plan for Problem One Active  THN Long Term Goal  Patient will not have a COPD exacerbation within the next 90 days  Interventions for Problem One Long Term Goal RN reiterated staying cool and not overheating. RN reiterated medication adherence. RN reiterd COPD zones and action plan  THN CM Short Term Goal #1  Patient will verbalize receiving EXercise program activity booklet and starting an easy exercise routine within the next 30 days  THN CM Short Term  Goal #1 Start Date 05/10/20  Interventions for Short Term Goal #1 RN discussed with patient about doing an easy exercises routine. RN will sent Exercise activity booklet. RN willfollow up with further discussion.  THN CM Short Term Goal #2  Patient will follow up on health maintenance within the next 30 days  THN CM Short Term Goal #2 Start Date 05/10/20  Interventions for Short Term Goal #2 RN discusses follow up with health maintenance. RN discusses eye exams and vaccines. RN will follow up for further discussion      Assessment:  Patient does have some shortness of breath on moderate exertion Patient is taking medications as prescribed RN discussed with pharmacy on reordering Stiolto  medication Patient has not been doing an exercise routine   Plan:  RN discussed COPD zones and action plan RN discussed starting an exercise routine RN sent Exercise Program activity booklet RN reiterated health maintenance RN sent update assessment  to PCP RN will follow up within the month of September   [place] Management [phone]

## 2020-05-12 DIAGNOSIS — L905 Scar conditions and fibrosis of skin: Secondary | ICD-10-CM | POA: Diagnosis not present

## 2020-05-12 DIAGNOSIS — D225 Melanocytic nevi of trunk: Secondary | ICD-10-CM | POA: Diagnosis not present

## 2020-05-12 DIAGNOSIS — L814 Other melanin hyperpigmentation: Secondary | ICD-10-CM | POA: Diagnosis not present

## 2020-05-12 DIAGNOSIS — C44529 Squamous cell carcinoma of skin of other part of trunk: Secondary | ICD-10-CM | POA: Diagnosis not present

## 2020-05-12 DIAGNOSIS — L821 Other seborrheic keratosis: Secondary | ICD-10-CM | POA: Diagnosis not present

## 2020-06-13 NOTE — Progress Notes (Signed)
@Patient  ID: Tiffany [name], female    DOB: 26-Jan-1946, 74 y.o.   MRN: [id number]  Chief Complaint  Patient presents with  . Follow-up    COPD, doing good    Referring provider: Rita Ohara, MD  HPI: 74 year old female, former smoker quit in 2019 (118-pack-year history).  Past medical history significant for severe COPD.  Patient of Dr. Loanne Drilling, last seen in office December 16, 2019.  Patient is improved on LAMA though not fully controlled.  Continue regimen and will consider changing to LABA/LAMA at next office visit.   Also may consider pulmonary rehab in the future once reopens.  Maintained on Spiriva Respimat 2 puffs once daily and as needed albuterol.  06/14/2020 Patient presents today for a 1-month follow-up COPD. She is doing quite well. She only experiences dyspnea when walking up a hill.  She is currently on Stiolto Respimat 2 puffs once daily.  She feels that this has been beneficial.  She will use albuterol at this time which is helpful. She has occasional productive with clear/white sputum and wheezing on exertion. 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 She think she receives patient assistance. She is up to date with pneumonia and flu vaccine.  She has not yet received COVID-19 vaccine, she is unsure whether or not she will get this.  Discussed benefits of receiving vaccine at length today with patient.   No Known Allergies  Immunization History  Administered Date(s) Administered  . Fluad Quad(high Dose 65+) 09/15/2019  . Influenza Split 10/16/2012  . Influenza, High Dose Seasonal PF 09/02/2013, 08/25/2014, 08/31/2015, 11/01/2016, 09/09/2017, 09/09/2018  . Pneumococcal Conjugate-13 08/25/2014  . Pneumococcal Polysaccharide-23 12/12/2009, 08/31/2015  . Tdap 12/12/2009    Past Medical History:  Diagnosis Date  . Abnormal PFT 09/02/14   moderate restrictive ventilatory defect.  Marland Kitchen BCC (basal cell carcinoma), face 2010   Dr. Allyson Sabal  . Colon polyp 2/05  . Diverticulosis 11/2010     seen on colonoscopy and air contrast BE  . Emphysema of lung ([place])   . Hyperlipidemia   . Hypertension    previously treated with meds, resolved  . MGUS (monoclonal gammopathy of unknown significance) 1999   prevously under care of Dr. Learta Codding  . Osteopenia    [facility] 01/31/2010 at [facility]; 2014 at [facility]  . 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 Tobacco abuse     Tobacco History: Social History   Tobacco Use  Smoking Status Former Smoker  . Packs/day: 2.00  . Years: 59.00  . Pack years: 118.00  . Types: Cigarettes  . Start date: 80  . Quit date: 09/10/2018  . Years since quitting: 1.7  Smokeless Tobacco Never Used   Counseling given: Not Answered   Outpatient Medications Prior to Visit  Medication Sig Dispense Refill  . albuterol (VENTOLIN HFA) 108 (90 Base) MCG/ACT inhaler Inhale 2 puffs into the lungs every 6 (six) hours as needed for wheezing or shortness of breath. 18 g 0  . aspirin EC 81 MG tablet Take 1 tablet (81 mg total) by mouth daily.    . Calcium-Vitamin D (CALTRATE 600 PLUS-VIT D PO) Take 1 each by mouth daily.    . Cholecalciferol (VITAMIN D3) 125 MCG (5000 UT) CAPS Take 1 capsule by mouth daily.     . fluticasone (FLONASE) 50 MCG/ACT nasal spray Place 2 sprays into both nostrils daily. 16 g 0  . losartan (COZAAR) 50 MG tablet Take 1 tablet (50 mg total) by mouth  daily. 90 tablet 1  . simvastatin (ZOCOR) 20 MG tablet Take 1 tablet (20 mg total) by mouth daily. 90 tablet 1  . Tiotropium Bromide-Olodaterol (STIOLTO RESPIMAT) 2.5-2.5 MCG/ACT AERS Inhale 2 puffs into the lungs daily.    Marland Kitchen tiotropium (SPIRIVA) 18 MCG inhalation capsule Place 18 mcg into inhaler and inhale daily.      No facility-administered medications prior to visit.   Review of Systems  Review of Systems  Constitutional: Negative.   HENT: Negative.   Respiratory: Positive for cough and wheezing.        Dyspnea with hills  Cardiovascular: Negative for leg  swelling.   Physical Exam  BP 120/70 (BP Location: Left Arm, Cuff Size: Normal)   Pulse 71   Temp (!) 97.3 F (36.3 C) (Oral)   Ht 5\' 7"  (1.702 m)   Wt 160 lb 3.2 oz (72.7 kg)   SpO2 95%   BMI 25.09 kg/m  Physical Exam Constitutional:      General: She is not in acute distress.    Appearance: Normal appearance. She is not ill-appearing.     Comments: Appears well  HENT:     Head: Normocephalic and atraumatic.     Mouth/Throat:     Mouth: Mucous membranes are moist.     Pharynx: Oropharynx is clear.  Cardiovascular:     Rate and Rhythm: Normal rate and regular rhythm.  Pulmonary:     Effort: Pulmonary effort is normal.     Breath sounds: Normal breath sounds. No wheezing or rhonchi.  Musculoskeletal:        General: Normal range of motion.  Skin:    Comments: LE peripheral vascular diseases  Neurological:     General: No focal deficit present.     Mental Status: She is alert and oriented to person, place, and time. Mental status is at baseline.  Psychiatric:        Mood and Affect: Mood normal.        Behavior: Behavior normal.        Thought Content: Thought content normal.        Judgment: Judgment normal.      Lab Results:  CBC    Component Value Date/Time   WBC 5.0 02/04/2020 1104   WBC 7.8 09/15/2018 0605   RBC 4.76 02/04/2020 1104   RBC 4.71 09/15/2018 0605   HGB 15.0 02/04/2020 1104   HCT 44.2 02/04/2020 1104   PLT 230 02/04/2020 1104   MCV 93 02/04/2020 1104   MCH 31.5 02/04/2020 1104   MCH 30.6 09/15/2018 0605   MCHC 33.9 02/04/2020 1104   MCHC 31.5 09/15/2018 0605   RDW 11.8 02/04/2020 1104   LYMPHSABS 1.2 02/04/2020 1104   MONOABS 0.2 09/15/2018 0605   EOSABS 0.1 02/04/2020 1104   BASOSABS 0.0 02/04/2020 1104    BMET    Component Value Date/Time   NA 137 02/04/2020 1104   K 4.2 02/04/2020 1104   CL 98 02/04/2020 1104   CO2 22 02/04/2020 1104   GLUCOSE 93 02/04/2020 1104   GLUCOSE 127 (H) 09/15/2018 0605   BUN 14 02/04/2020 1104    CREATININE 0.92 02/04/2020 1104   CREATININE 0.72 12/02/2017 0945   CALCIUM 9.1 02/04/2020 1104   GFRNONAA 62 02/04/2020 1104   GFRAA 72 02/04/2020 1104    BNP No results found for: BNP  ProBNP No results found for: PROBNP  Imaging: No results found.   Assessment & Plan:   COPD (chronic obstructive pulmonary disease) (  [place]) - Patient is doing quite well on LABA/LAMA, however, stiolto not covered by her insurance. I do not believe she has tried another medication in this class. We will do a benefits investigation. For now, continue Stiolto 2 puffs once daily in the morning (samples given today). Continue albuterol rescue inhaler 2 puffs every 4-6 hours as needed for breakthrough shortness of breath or wheezing. Recommend patient consider getting Covid vaccine as discussed during visit today. Monitor for worsening shortness of breath or purulent mucus production. Follow-up in 6 months with Dr. Loanne Drilling or sooner if needed     Martyn Ehrich, NP 06/14/2020

## 2020-06-14 ENCOUNTER — Telehealth: Payer: Self-pay | Admitting: Primary Care

## 2020-06-14 ENCOUNTER — Ambulatory Visit: Payer: Medicare Other | Admitting: Primary Care

## 2020-06-14 ENCOUNTER — Encounter: Payer: Self-pay | Admitting: Primary Care

## 2020-06-14 VITALS — BP 120/70 | HR 71 | Temp 97.3°F | Ht 67.0 in | Wt 160.2 lb

## 2020-06-14 DIAGNOSIS — J449 Chronic obstructive pulmonary disease, unspecified: Secondary | ICD-10-CM

## 2020-06-14 MED ORDER — STIOLTO RESPIMAT 2.5-2.5 MCG/ACT IN AERS: 2.0000 | INHALATION_SPRAY | Freq: Every day | RESPIRATORY_TRACT | 0 refills | Status: DC

## 2020-06-14 NOTE — Telephone Encounter (Signed)
Can we do benefits investigation for LABA/LAMA. Patient is currently on stiolto and benefiting from this but not covered by insurance. I do not believe she has tried any other in class.   -Beth

## 2020-06-14 NOTE — Assessment & Plan Note (Signed)
-   Patient is doing quite well on LABA/LAMA, however, stiolto not covered by her insurance. I do not believe she has tried another medication in this class. We will do a benefits investigation. For now, continue Stiolto 2 puffs once daily in the morning (samples given today). Continue albuterol rescue inhaler 2 puffs every 4-6 hours as needed for breakthrough shortness of breath or wheezing. Recommend patient consider getting Covid vaccine as discussed during visit today. Monitor for worsening shortness of breath or purulent mucus production. Follow-up in 6 months with Dr. Loanne Drilling or sooner if needed

## 2020-06-14 NOTE — Patient Instructions (Addendum)
Pleasure meeting you today Tiffany [name]  COPD - Continue Stiolto 2 puffs once daily in the morning (2 samples given today, we will look into applying for patient assistance) - Continue albuterol rescue inhaler 2 puffs every 4-6 hours as needed for breakthrough shortness of breath or wheezing - Recommend you consider getting Covid vaccine as discussed during visit today - Monitor for worsening shortness of breath or purulent mucus production  Follow-up - 6 months with Dr. Loanne Drilling or sooner if needed   COPD and Physical Activity Chronic obstructive pulmonary disease (COPD) is a long-term (chronic) condition that affects the lungs. COPD is a general term that can be used to describe many different lung problems that cause lung swelling (inflammation) and limit airflow, including chronic bronchitis and emphysema. The main symptom of COPD is shortness of breath, which makes it harder to do even simple tasks. This can also make it harder to exercise and be active. Talk with your health care provider about treatments to help you breathe better and actions you can take to prevent breathing problems during physical activity. What are the benefits of exercising with COPD? Exercising regularly is an important part of a healthy lifestyle. You can still exercise and do physical activities even though you have COPD. Exercise and physical activity improve your shortness of breath by increasing blood flow (circulation). This causes your heart to pump more oxygen through your body. Moderate exercise can improve your:  Oxygen use.  Energy level.  Shortness of breath.  Strength in your breathing muscles.  Heart health.  Sleep.  Self-esteem and feelings of self-worth.  Depression, stress, and anxiety levels. Exercise can benefit everyone with COPD. The severity of your disease may affect how hard you can exercise, especially at first, but everyone can benefit. Talk with your health care provider about how  much exercise is safe for you, and which activities and exercises are safe for you. What actions can I take to prevent breathing problems during physical activity?  Sign up for a pulmonary rehabilitation program. This type of program may include: ? Education about lung diseases. ? Exercise classes that teach you how to exercise and be more active while improving your breathing. This usually involves:  Exercise using your lower extremities, such as a stationary bicycle.  About 30 minutes of exercise, 2 to 5 times per week, for 6 to 12 weeks  Strength training, such as push ups or leg lifts. ? Nutrition education. ? Group classes in which you can talk with others who also have COPD and learn ways to manage stress.  If you use an oxygen tank, you should use it while you exercise. Work with your health care provider to adjust your oxygen for your physical activity. Your resting flow rate is different from your flow rate during physical activity.  While you are exercising: ? Take slow breaths. ? Pace yourself and do not try to go too fast. ? Purse your lips while breathing out. Pursing your lips is similar to a kissing or whistling position. ? If doing exercise that uses a quick burst of effort, such as weight lifting:  Breathe in before starting the exercise.  Breathe out during the hardest part of the exercise (such as raising the weights). Where to find support You can find support for exercising with COPD from:  Your health care provider.  A pulmonary rehabilitation program.  Your local health department or community health programs.  Support groups, online or in-person. Your health care provider may  be able to recommend support groups. Where to find more information You can find more information about exercising with COPD from:  American Lung Association: ClassInsider.se.  COPD Foundation: https://www.rivera.net/. Contact a health care provider if:  Your symptoms get worse.  You  have chest pain.  You have nausea.  You have a fever.  You have trouble talking or catching your breath.  You want to start a new exercise program or a new activity. Summary  COPD is a general term that can be used to describe many different lung problems that cause lung swelling (inflammation) and limit airflow. This includes chronic bronchitis and emphysema.  Exercise and physical activity improve your shortness of breath by increasing blood flow (circulation). This causes your heart to provide more oxygen to your body.  Contact your health care provider before starting any exercise program or new activity. Ask your health care provider what exercises and activities are safe for you. This information is not intended to replace advice given to you by your health care provider. Make sure you discuss any questions you have with your health care provider. Document Revised: 03/04/2019 Document Reviewed: 12/05/2017 Elsevier Patient Education  2020 [employer].

## 2020-06-15 NOTE — Telephone Encounter (Signed)
Patient states she was able to find a pharmacy she got it for free but does not recall the name she will call back with the name.

## 2020-06-15 NOTE — Telephone Encounter (Signed)
Please let patient know Stiolto through [employer] and copay is $37.00 for 1 month supply. Patient may have had a deductible prior. If needed she can apply for patient assistance

## 2020-06-15 NOTE — Telephone Encounter (Signed)
Ran test claim for Stiolto through [employer] and copay is $37.00 for 1 month supply. Patient may have had a deductible prior.  [facility]- copay is also $37.00 Bevespi is non-formulary.

## 2020-06-23 DIAGNOSIS — L820 Inflamed seborrheic keratosis: Secondary | ICD-10-CM | POA: Diagnosis not present

## 2020-06-23 DIAGNOSIS — Z85828 Personal history of other malignant neoplasm of skin: Secondary | ICD-10-CM | POA: Diagnosis not present

## 2020-06-23 DIAGNOSIS — L905 Scar conditions and fibrosis of skin: Secondary | ICD-10-CM | POA: Diagnosis not present

## 2020-07-14 ENCOUNTER — Other Ambulatory Visit: Payer: Self-pay | Admitting: Pharmacy Technician

## 2020-07-14 NOTE — Patient Outreach (Signed)
[facility]) Care Management  07/14/2020  Jaylia Pettus Fusselman 03-01-1946 [id number]   Received message from [place] that patient had a question about her BI application that former [facility] CPhT Etter Sjogren had been assisting her with.  Successful outreach call placed to patient in regards to Stiolto refill from [facility].  Spoke to patient, HIPAA identifiers verified.  Informed patient that I had requested her provider send in a new prescription for Stiolto to [facility]. Provided patient phone number to call BI and inquire about shipment. Informed her to call them Monday. Patient verbalized understanding.   P. , [place]  [phone]

## 2020-07-15 MED ORDER — STIOLTO RESPIMAT 2.5-2.5 MCG/ACT IN AERS
2.0000 | INHALATION_SPRAY | Freq: Every day | RESPIRATORY_TRACT | 11 refills | Status: DC
Start: 2020-07-15 — End: 2021-07-10

## 2020-07-15 NOTE — Addendum Note (Signed)
Addended by: Martyn Ehrich on: 07/15/2020 11:00 AM   Modules accepted: Orders

## 2020-07-18 ENCOUNTER — Ambulatory Visit: Payer: Medicare Other | Admitting: *Deleted

## 2020-08-07 NOTE — Progress Notes (Signed)
Chief Complaint  Patient presents with  . Hypertension    fasting med check. Had both COVID vaccines, I put in chart. 2nd one was 07/22/20. I assured her it was ok to have flu shot today but she wants to spwak with you first. No new concerns.     Patient presents for 6 month med check.  She had her second COVID vaccine 8/27 (sore arm, no other side effects; had some dizziness from the first).  She has some questions regarding the vaccine and getting sick, which were answered today.  Hypertension. Sheis compliant with takinglosartan 50mg . BP's have been good elsewhere (ie pulm clinic in July). Has a monitor at home, but checks infrequently, never high. BP Readings from Last 3 Encounters:  06/14/20 120/70  02/04/20 120/70  12/16/19 134/64   She denies side effects, headaches, chest pain, palpitations, edema, shortness of breath (other than from COPD).   Hyperlipidemia: She is compliant with taking simvastatin. She sometimes notices heartburn (takes between dinner and bedtime, last noted it after pizza), doesn't think she has any other side effects. Tries to follow lowfat, low cholesterol diet She was noted to have aortic atherosclerosis on CT for lung cancer screening, and calcified atherosclerotic plaque in R coronary artery. Carotid US in 06/2019 didn't show any hemodynamically significant stenosis.  She denies any chest pain (just chronic DOE, improved with treatment for COPD), denies neurologic symptoms. She is due for repeat lipids today. Lab Results  Component Value Date   CHOL 146 06/22/2019   HDL 54 06/22/2019   LDLCALC 75 06/22/2019   TRIG 86 06/22/2019   CHOLHDL 2.7 06/22/2019   Osteopenia--She previously took bisphosphonates (changed from fosamax (due to nausea) to Actonel, as well as Boniva, both of which she tolerated but stopped due to cost issues, possibly some GI side effects (heartburn--she stopped without contacting us)).She takes a calcium +D supplement, but not daily,  more sporadic. She doesn't get any regular weight-bearing exercise.She quit smoking10/2019. DEXA was rechecked 03/2020, and was stable (T-2.1 at left fem neck).  Encouraged adequate Ca, D and weight-bearing exercise, and repeat in 2 years.  Vitamin D deficiency. Last level was 29.1 in 05/2019, when taking 5000 IU daily.  Prior level was 24.6 in 11/2018. She is currently taking vitamin D "when I think of it", not every day.  Previously said the dose was 5000 IU, thinks it is the same (though can't recall the dose right now).   Hasn't been taking calcium regularly.  Tobacco useand COPD--Shequit smoking 09/10/18. Breathing has improved, still has some DOE walking up hill with groceries. She remains under the care of pulmonary, prescribed Stiolto, and she reports this seems to help. She uses albuterol prn, usually once a day, sometimes none. She gets yearly lung cancer screening CT's, last 01/2020, stable.  Atherosclerosis:  She is compliant with her statin. Aspirin sometimes bothers her stomach, takes it with food, but not daily (sporadically only)  She still complains of itchy ears.  She tried some mineral oil with a q-tip which hasn't helped.  She denies allergy symptoms. Ears itched this morning, but not currently.  PMH, PSH, SH reviewed  Outpatient Encounter Medications as of 08/08/2020  Medication Sig Note  . albuterol (VENTOLIN HFA) 108 (90 Base) MCG/ACT inhaler Inhale 2 puffs into the lungs every 6 (six) hours as needed for wheezing or shortness of breath. 08/08/2020: Using once daily if needed  . losartan (COZAAR) 50 MG tablet Take 1 tablet (50 mg total) by mouth daily.   Marland Kitchen  simvastatin (ZOCOR) 20 MG tablet Take 1 tablet (20 mg total) by mouth daily.   . Tiotropium Bromide-Olodaterol (STIOLTO RESPIMAT) 2.5-2.5 MCG/ACT AERS Inhale 2 puffs into the lungs daily.   Marland Kitchen aspirin EC 81 MG tablet Take 1 tablet (81 mg total) by mouth daily. (Patient not taking: Reported on 08/08/2020)   .  Calcium-Vitamin D (CALTRATE 600 PLUS-VIT D PO) Take 1 each by mouth daily. (Patient not taking: Reported on 08/08/2020)   . Cholecalciferol (VITAMIN D3) 125 MCG (5000 UT) CAPS Take 1 capsule by mouth daily.  (Patient not taking: Reported on 08/08/2020)   . fluticasone (FLONASE) 50 MCG/ACT nasal spray Place 2 sprays into both nostrils daily. (Patient not taking: Reported on 08/08/2020)    No facility-administered encounter medications on file as of 08/08/2020.   No Known Allergies  ROS: no fever, chills, URI symptoms.  Some chronic cough, unchanged.  DOE per HPI, overall improved. Denies headaches, dizziness, chest pain, edema, nausea, vomiting, diarrhea, bleeding, bruising, rash. Moods are good. Occasional heartburn.  Had some after pizza the other night.  No dysphagia Itchy ears, intermittently.   PHYSICAL EXAM:  BP 132/72   Pulse 72   Ht $R'5\' 7"'pm$  (1.702 m)   Wt 160 lb 12.8 oz (72.9 kg)   BMI 25.18 kg/m   Wt Readings from Last 3 Encounters:  08/08/20 160 lb 12.8 oz (72.9 kg)  06/14/20 160 lb 3.2 oz (72.7 kg)  02/04/20 160 lb 3.2 oz (72.7 kg)    HEENT: conjunctiva and sclera are clear.EOMI. TM's and EACs are normal.  Wearing mask due to COVID-19 pandemic. Neck: no lymphadenopathy, thyromegalyor mass, no bruit. Heart: regular rate and rhythm, no murmur Lungs: somewhat distant. No wheezes, rales, ronchi Back:no spinal or CVA tenderness Abdomen: soft, nontender, no organomegaly or mass Extremities: no edema, normal pulses Skin: normal turgor. Purpura at the upper right shin. Psych: normal mood, affect, hygiene and grooming Neuro: alert and oriented, normal gait  ASSESSMENT/PLAN:  Essential hypertension - well controlled on current regimen, continue - Plan: Comprehensive metabolic panel, losartan (COZAAR) 50 MG tablet  Vitamin D deficiency - noncompliant with supplements, expect to be low. discussed importance in getting adequate D and Calcium to prevent further bone loss - Plan:  VITAMIN D 25 Hydroxy (Vit-D Deficiency, Fractures)  Chronic obstructive pulmonary disease, unspecified COPD type ([place]) - stable on current regimen, per pulm  Aortic atherosclerosis (HCC) - cont statin. Daily aspirin recommended - Plan: Lipid panel  Chronic respiratory failure with hypoxia (HCC) - stable on stiolo and albuterol prn  Osteopenia, unspecified location - counseled re: calcium, D and weight-bearing exercise  Atherosclerosis of right coronary artery  Medication monitoring encounter - Plan: Lipid panel, Comprehensive metabolic panel, VITAMIN D 25 Hydroxy (Vit-D Deficiency, Fractures)  Pure hypercholesterolemia - Plan: simvastatin (ZOCOR) 20 MG tablet  Need for influenza vaccination - Plan: Flu Vaccine QUAD High Dose(Fluad)   C-met, lipids, D  F/u 6 months Has BCBS, can't do AWV and CPE together-- Schedule fasting AWV (do labs then), then CPE 1-2 weeks later, both 30 minutes.

## 2020-08-08 ENCOUNTER — Ambulatory Visit (INDEPENDENT_AMBULATORY_CARE_PROVIDER_SITE_OTHER): Payer: Medicare Other | Admitting: Family Medicine

## 2020-08-08 ENCOUNTER — Encounter: Payer: Self-pay | Admitting: Family Medicine

## 2020-08-08 ENCOUNTER — Other Ambulatory Visit: Payer: Self-pay

## 2020-08-08 VITALS — BP 132/72 | HR 72 | Ht 67.0 in | Wt 160.8 lb

## 2020-08-08 DIAGNOSIS — I10 Essential (primary) hypertension: Secondary | ICD-10-CM | POA: Diagnosis not present

## 2020-08-08 DIAGNOSIS — Z5181 Encounter for therapeutic drug level monitoring: Secondary | ICD-10-CM

## 2020-08-08 DIAGNOSIS — E559 Vitamin D deficiency, unspecified: Secondary | ICD-10-CM | POA: Diagnosis not present

## 2020-08-08 DIAGNOSIS — E7800 Pure hypercholesterolemia, unspecified: Secondary | ICD-10-CM

## 2020-08-08 DIAGNOSIS — Z23 Encounter for immunization: Secondary | ICD-10-CM

## 2020-08-08 DIAGNOSIS — J449 Chronic obstructive pulmonary disease, unspecified: Secondary | ICD-10-CM | POA: Diagnosis not present

## 2020-08-08 DIAGNOSIS — M8580 Other specified disorders of bone density and structure, unspecified site: Secondary | ICD-10-CM

## 2020-08-08 DIAGNOSIS — I700 Atherosclerosis of aorta: Secondary | ICD-10-CM | POA: Diagnosis not present

## 2020-08-08 DIAGNOSIS — J9611 Chronic respiratory failure with hypoxia: Secondary | ICD-10-CM

## 2020-08-08 DIAGNOSIS — I2510 Atherosclerotic heart disease of native coronary artery without angina pectoris: Secondary | ICD-10-CM

## 2020-08-08 MED ORDER — SIMVASTATIN 20 MG PO TABS: 20.0000 mg | ORAL_TABLET | Freq: Every day | ORAL | 1 refills | Status: DC

## 2020-08-08 MED ORDER — LOSARTAN POTASSIUM 50 MG PO TABS: 50.0000 mg | ORAL_TABLET | Freq: Every day | ORAL | 1 refills | Status: DC

## 2020-08-08 NOTE — Patient Instructions (Addendum)
Your heartburn may not be related to the simvastatin.  You can switch the simvastatin to morning and see if you still get it at night. It may be more related to timing and what you ate. See info below.  For your atherosclerosis, it is important to try and take aspirin daily if you can tolerate it (along with taking a daily statin, as you are). Take an enteric-coated 81mg  aspirin, and take it with food. This should be the best tolerated way to try and take the aspirin.  It is important to get a total of 1200-1500mg  of calcium every day, in combination of what you get from your diet and from your vitamins/supplements.  If you aren't getting enough in your diet, please be sure to take the calcium daily.  I suspect your vitamin D is going to be low, since you haven't been taking it every day.  It is important to take it every day, as this helps your body absorb the calcium, and helps your bones.  You should be taking 5000 IU every day.  If it is very low, we may need to treat you with another prescription course--we will be in touch with the results.   Food Choices for Gastroesophageal Reflux Disease, Adult When you have gastroesophageal reflux disease (GERD), the foods you eat and your eating habits are very important. Choosing the right foods can help ease the discomfort of GERD. Consider working with a diet and nutrition specialist (dietitian) to help you make healthy food choices. What general guidelines should I follow?  Eating plan  Choose healthy foods low in fat, such as fruits, vegetables, whole grains, low-fat dairy products, and lean meat, fish, and poultry.  Eat frequent, small meals instead of three large meals each day. Eat your meals slowly, in a relaxed setting. Avoid bending over or lying down until 2-3 hours after eating.  Limit high-fat foods such as fatty meats or fried foods.  Limit your intake of oils, butter, and shortening to less than 8 teaspoons each day.  Avoid the  following: ? Foods that cause symptoms. These may be different for different people. Keep a food diary to keep track of foods that cause symptoms. ? Alcohol. ? Drinking large amounts of liquid with meals. ? Eating meals during the 2-3 hours before bed.  Cook foods using methods other than frying. This may include baking, grilling, or broiling. Lifestyle  Maintain a healthy weight. Ask your health care provider what weight is healthy for you. If you need to lose weight, work with your health care provider to do so safely.  Exercise for at least 30 minutes on 5 or more days each week, or as told by your health care provider.  Avoid wearing clothes that fit tightly around your waist and chest.  Do not use any products that contain nicotine or tobacco, such as cigarettes and e-cigarettes. If you need help quitting, ask your health care provider.  Sleep with the head of your bed raised. Use a wedge under the mattress or blocks under the bed frame to raise the head of the bed. What foods are not recommended? The items listed may not be a complete list. Talk with your dietitian about what dietary choices are best for you. Grains Pastries or quick breads with added fat. [place] toast. Vegetables Deep fried vegetables. [place] fries. Any vegetables prepared with added fat. Any vegetables that cause symptoms. For some people this may include tomatoes and tomato products, chili peppers, onions and  garlic, and horseradish. Fruits Any fruits prepared with added fat. Any fruits that cause symptoms. For some people this may include citrus fruits, such as oranges, grapefruit, pineapple, and lemons. Meats and other protein foods High-fat meats, such as fatty beef or pork, hot dogs, ribs, ham, sausage, salami and bacon. Fried meat or protein, including fried fish and fried chicken. Nuts and nut butters. Dairy Whole milk and chocolate milk. Sour cream. Cream. Ice cream. Cream cheese. Milk  shakes. Beverages Coffee and tea, with or without caffeine. Carbonated beverages. Sodas. Energy drinks. Fruit juice made with acidic fruits (such as orange or grapefruit). Tomato juice. Alcoholic drinks. Fats and oils Butter. Margarine. Shortening. Ghee. Sweets and desserts Chocolate and cocoa. Donuts. Seasoning and other foods Pepper. Peppermint and spearmint. Any condiments, herbs, or seasonings that cause symptoms. For some people, this may include curry, hot sauce, or vinegar-based salad dressings. Summary  When you have gastroesophageal reflux disease (GERD), food and lifestyle choices are very important to help ease the discomfort of GERD.  Eat frequent, small meals instead of three large meals each day. Eat your meals slowly, in a relaxed setting. Avoid bending over or lying down until 2-3 hours after eating.  Limit high-fat foods such as fatty meat or fried foods. This information is not intended to replace advice given to you by your health care provider. Make sure you discuss any questions you have with your health care provider. Document Revised: 03/05/2019 Document Reviewed: 11/13/2016 Elsevier Patient Education  [place].

## 2020-08-09 LAB — LIPID PANEL
Chol/HDL Ratio: 2.9 ratio (ref 0.0–4.4)
Cholesterol, Total: 164 mg/dL (ref 100–199)
HDL: 57 mg/dL (ref >39)
LDL Chol Calc (NIH): 88 mg/dL (ref 0–99)
Triglycerides: 104 mg/dL (ref 0–149)
VLDL Cholesterol Cal: 19 mg/dL (ref 5–40)

## 2020-08-09 LAB — COMPREHENSIVE METABOLIC PANEL
ALT: 14 IU/L (ref 0–32)
AST: 19 IU/L (ref 0–40)
Albumin/Globulin Ratio: 1.6 (ref 1.2–2.2)
Albumin: 4.5 g/dL (ref 3.7–4.7)
Alkaline Phosphatase: 105 IU/L (ref 44–121)
BUN/Creatinine Ratio: 15 (ref 12–28)
BUN: 13 mg/dL (ref 8–27)
Bilirubin Total: 0.5 mg/dL (ref 0.0–1.2)
CO2: 25 mmol/L (ref 20–29)
Calcium: 9.2 mg/dL (ref 8.7–10.3)
Chloride: 99 mmol/L (ref 96–106)
Creatinine, Ser: 0.85 mg/dL (ref 0.57–1.00)
GFR calc Af Amer: 79 mL/min/{1.73_m2} (ref >59)
GFR calc non Af Amer: 69 mL/min/{1.73_m2} (ref >59)
Globulin, Total: 2.9 g/dL (ref 1.5–4.5)
Glucose: 100 mg/dL — ABNORMAL HIGH (ref 65–99)
Potassium: 4.4 mmol/L (ref 3.5–5.2)
Sodium: 136 mmol/L (ref 134–144)
Total Protein: 7.4 g/dL (ref 6.0–8.5)

## 2020-08-09 LAB — VITAMIN D 25 HYDROXY (VIT D DEFICIENCY, FRACTURES): Vit D, 25-Hydroxy: 23.3 ng/mL — ABNORMAL LOW (ref 30.0–100.0)

## 2020-08-10 ENCOUNTER — Other Ambulatory Visit: Payer: Self-pay | Admitting: *Deleted

## 2020-08-10 MED ORDER — ERGOCALCIFEROL 1.25 MG (50000 UT) PO CAPS: 50000.0000 [IU] | ORAL_CAPSULE | ORAL | 0 refills | Status: DC

## 2020-08-18 ENCOUNTER — Telehealth: Payer: Self-pay | Admitting: Pulmonary Disease

## 2020-08-18 ENCOUNTER — Other Ambulatory Visit: Payer: Self-pay | Admitting: *Deleted

## 2020-08-18 MED ORDER — AZITHROMYCIN 250 MG PO TABS: ORAL_TABLET | ORAL | 0 refills | Status: DC

## 2020-08-18 NOTE — Telephone Encounter (Signed)
Spoke with patient regarding prior message. Advised patient per [facility] sent in zpack. Recommend Salt water gargle and tylenol for sore throat. Delsym cough syrup twice daily. If symptoms do not improved or develops fever notify office. Needs covid testing if she has not been vaccinated. Patient does have COVID vaccines. Patient's voice was understanding nothing else further needed.

## 2020-08-18 NOTE — Patient Outreach (Signed)
[facility]) Care Management  [place]  08/18/2020   Tiffany [name] 05-30-1946 [id number]  RN Health Coach telephone call to patient.  [employer] compliance verified. Per patient she is having coughing episode. Patient stated her sputum is yellow tinged. Patient having shortness of breath on exertion. Patient is having nausea with excessive coughing. Per patient she has only been to the grocery store. Patient has received  Pfizer COVID vaccine. Patient does not know of any exposure. Patient has put a call into pulmonary Dr and is awaiting a call back.  Patient became nauseated while coughing during phone call. Patient has agreed to later follow up out reach call.   Encounter Medications:  Outpatient Encounter Medications as of 08/18/2020  Medication Sig Note  . albuterol (VENTOLIN HFA) 108 (90 Base) MCG/ACT inhaler Inhale 2 puffs into the lungs every 6 (six) hours as needed for wheezing or shortness of breath. 08/08/2020: Using once daily if needed  . aspirin EC 81 MG tablet Take 1 tablet (81 mg total) by mouth daily. (Patient not taking: Reported on 08/08/2020)   . azithromycin (ZITHROMAX) 250 MG tablet Zpack taper as directed   . Calcium-Vitamin D (CALTRATE 600 PLUS-VIT D PO) Take 1 each by mouth daily. (Patient not taking: Reported on 08/08/2020)   . Cholecalciferol (VITAMIN D3) 125 MCG (5000 UT) CAPS Take 1 capsule by mouth daily.  (Patient not taking: Reported on 08/08/2020)   . ergocalciferol (VITAMIN D2) 1.25 MG (50000 UT) capsule Take 1 capsule (50,000 Units total) by mouth once a week.   . fluticasone (FLONASE) 50 MCG/ACT nasal spray Place 2 sprays into both nostrils daily. (Patient not taking: Reported on 08/08/2020)   . losartan (COZAAR) 50 MG tablet Take 1 tablet (50 mg total) by mouth daily.   . simvastatin (ZOCOR) 20 MG tablet Take 1 tablet (20 mg total) by mouth daily.   . Tiotropium Bromide-Olodaterol (STIOLTO RESPIMAT) 2.5-2.5 MCG/ACT AERS Inhale 2 puffs into  the lungs daily.    No facility-administered encounter medications on file as of 08/18/2020.    Functional Status:  In your present state of health, do you have any difficulty performing the following activities: 02/04/2020 10/16/2019  Hearing? N N  Vision? N N  Difficulty concentrating or making decisions? Y N  Comment just some "old age" memory stuff, like remembering a word here or there -  Walking or climbing stairs? Y Y  Comment left knee bothers her at times Per patient gets short of breath walking up stairs or carrying in groceries  Dressing or bathing? N N  Doing errands, shopping? N N  Preparing Food and eating ? N N  Using the Toilet? N N  In the past six months, have you accidently leaked urine? N N  Do you have problems with loss of bowel control? N N  Managing your Medications? N N  Managing your Finances? N N  Housekeeping or managing your Housekeeping? N N  Some recent data might be hidden    Fall/Depression Screening: Fall Risk  08/18/2020 08/08/2020 02/04/2020  Falls in the past year? 0 0 0  Number falls in past yr: 1 - -  Injury with Fall? 0 - -  Risk for fall due to : History of fall(s) - -  Follow up Falls evaluation completed - -   PHQ 2/9 Scores 08/08/2020 02/04/2020 10/16/2019 09/17/2019 12/03/2018 06/04/2018 12/02/2017  PHQ - 2 Score 0 0 0 0 0 0 0   Goals Addressed  This Visit's Progress   . Client will verbalize knowledge of chronic lung disease as evidenced by no ED visits or Inpatient stays related to chronic lung disease        CARE PLAN ENTRY (see longitudinal plan of care for additional care plan information)  Current Barriers:  Marland Kitchen Knowledge deficits related to basic understanding of COPD disease process . Knowledge deficits related to basic COPD self care/management . Knowledge deficit related to importance of energy conservation . Cannot afford prescribed medications   Case Manager Clinical Goal(s):  Over the next 90 days patient will  report using inhalers as prescribed including rinsing mouth after use  Over the next 90 days patient will report utilizing pursed lip breathing for shortness of breath  Over the next 90 days, patient will be able to verbalize understanding of COPD action plan and when to seek appropriate levels of medical care  Over the next 90 days, patient will engage in lite exercise as tolerated to build/regain stamina and strength and reduce shortness of breath through activity tolerance  Over the next 90 days, patient will verbalize basic understanding of COPD disease process and self care activities  Over the next 90 days, patient will not be hospitalized for COPD exacerbation   Interventions:   Provided patient with basic written and verbal COPD education on self care/management/and exacerbation prevention   Provided patient with COPD action plan and reinforced importance of daily self assessment  Provided instruction about proper use of medications used for management of COPD including inhalers  Advised patient to self assesses COPD action plan zone and make appointment with provider if in the yellow zone for 48 hours without improvement.  Provided patient with education about the role of exercise in the management of COPD  Advised patient to engage in light exercise as tolerated 3-5 days a week  Patient Self Care Activities:  . Takes medications as prescribed including inhalers . Practices and uses pursed lip breathing for shortness of breath recovery and prevention . Self assesses COPD action plan zone and makes appointment with provider if in the yellow zone for 48 hours without improvement. . Engages in light exercise 3-5 days a week . Utilizes infection prevention strategies to reduce risk of respiratory infection  . Behavior modifications  Initial goal documentation        Assessment:  Patient is coughing Per patient yellow tinged sputum Patient is using inhalers as per  ordered coughing worse Patient is having nausea with excess coughing Patient has notified Dr awaiting call back Patient has received Pfizer COVID vaccine Plan:  Patient will await further instruction from pulmonary Dr Patient will continue to use inhalers Patient has notified Dr office of the symptoms above RN will follow up outreach  [facility] [phone]

## 2020-08-18 NOTE — Telephone Encounter (Signed)
Primary Pulmonologist: Dr.Ellison Last office visit and with whom: [name]  What do we see them for (pulmonary problems): COPD Last OV assessment/plan:  Assessment & Plan:   COPD (chronic obstructive pulmonary disease) ([place]) - Patient is doing quite well on LABA/LAMA, however, stiolto not covered by her insurance. I do not believe she has tried another medication in this class. We will do a benefits investigation. For now, continue Stiolto 2 puffs once daily in the morning (samples given today). Continue albuterol rescue inhaler 2 puffs every 4-6 hours as needed for breakthrough shortness of breath or wheezing. Recommend patient consider getting Covid vaccine as discussed during visit today. Monitor for worsening shortness of breath or purulent mucus production. Follow-up in 6 months with Dr. Loanne Drilling or sooner if needed     Martyn Ehrich, NP 06/14/2020     Assessment & Plan Note by Martyn Ehrich, NP at 06/14/2020 1:04 PM Author: Martyn Ehrich, NP Author Type: Nurse Practitioner Filed: 06/14/2020 1:05 PM  Note Status: Written Cosign: Cosign Not Required Encounter Date: 06/14/2020  Problem: COPD (chronic obstructive pulmonary disease) [facility])  Editor: Martyn Ehrich, NP (Nurse Practitioner)               - Patient is doing quite well on LABA/LAMA, however, stiolto not covered by her insurance. I do not believe she has tried another medication in this class. We will do a benefits investigation. For now, continue Stiolto 2 puffs once daily in the morning (samples given today). Continue albuterol rescue inhaler 2 puffs every 4-6 hours as needed for breakthrough shortness of breath or wheezing. Recommend patient consider getting Covid vaccine as discussed during visit today. Monitor for worsening shortness of breath or purulent mucus production. Follow-up in 6 months with Dr. Loanne Drilling or sooner if needed     Patient Instructions by Martyn Ehrich, NP at 06/14/2020  11:30 AM Author: Martyn Ehrich, NP Author Type: Nurse Practitioner Filed: 06/14/2020 11:54 AM  Note Status: Addendum Cosign: Cosign Not Required Encounter Date: 06/14/2020  Editor: Martyn Ehrich, NP (Nurse Practitioner)      Prior Versions: 1. Martyn Ehrich, NP (Nurse Practitioner) at 06/14/2020 11:52 AM - Addendum   2. Martyn Ehrich, NP (Nurse Practitioner) at 06/14/2020 11:51 AM - Signed    Pleasure meeting you today Tiffany [name]  COPD - Continue Stiolto 2 puffs once daily in the morning (2 samples given today, we will look into applying for patient assistance) - Continue albuterol rescue inhaler 2 puffs every 4-6 hours as needed for breakthrough shortness of breath or wheezing - Recommend you consider getting Covid vaccine as discussed during visit today - Monitor for worsening shortness of breath or purulent mucus production  Follow-up - 6 months with Dr. Loanne Drilling or sooner if needed   COPD and Physical Activity Chronic obstructive pulmonary disease (COPD) is a long-term (chronic) condition that affects the lungs. COPD is a general term that can be used to describe many different lung problems that cause lung swelling (inflammation) and limit airflow, including chronic bronchitis and emphysema. The main symptom of COPD is shortness of breath, which makes it harder to do even simple tasks. This can also make it harder to exercise and be active. Talk with your health care provider about treatments to help you breathe better and actions you can take to prevent breathing problems during physical activity. What are the benefits of exercising with COPD?  Exercising regularly is an important part of a healthy  lifestyle. You can still exercise and do physical activities even though you have COPD. Exercise and physical activity improve your shortness of breath by increasing blood flow (circulation). This causes your heart to pump more oxygen through your body. Moderate exercise can  improve your:  Oxygen use.  Energy level.  Shortness of breath.  Strength in your breathing muscles.  Heart health.  Sleep.  Self-esteem and feelings of self-worth.  Depression, stress, and anxiety levels. Exercise can benefit everyone with COPD. The severity of your disease may affect how hard you can exercise, especially at first, but everyone can benefit. Talk with your health care provider about how much exercise is safe for you, and which activities and exercises are safe for you. What actions can I take to prevent breathing problems during physical activity?  Sign up for a pulmonary rehabilitation program. This type of program may include: ? Education about lung diseases. ? Exercise classes that teach you how to exercise and be more active while improving your breathing. This usually involves:  Exercise using your lower extremities, such as a stationary bicycle.  About 30 minutes of exercise, 2 to 5 times per week, for 6 to 12 weeks  Strength training, such as push ups or leg lifts. ? Nutrition education. ? Group classes in which you can talk with others who also have COPD and learn ways to manage stress.  If you use an oxygen tank, you should use it while you exercise. Work with your health care provider to adjust your oxygen for your physical activity. Your resting flow rate is different from your flow rate during physical activity.  While you are exercising: ? Take slow breaths. ? Pace yourself and do not try to go too fast. ? Purse your lips while breathing out. Pursing your lips is similar to a kissing or whistling position. ? If doing exercise that uses a quick burst of effort, such as weight lifting:  Breathe in before starting the exercise.  Breathe out during the hardest part of the exercise (such as raising the weights). Where to find support You can find support for exercising with COPD from:  Your health care provider.  A pulmonary rehabilitation  program.  Your local health department or community health programs.  Support groups, online or in-person. Your health care provider may be able to recommend support groups. Where to find more information You can find more information about exercising with COPD from:  American Lung Association: ClassInsider.se.  COPD Foundation: https://www.rivera.net/. Contact a health care provider if:  Your symptoms get worse.  You have chest pain.  You have nausea.  You have a fever.  You have trouble talking or catching your breath.  You want to start a new exercise program or a new activity. Summary  COPD is a general term that can be used to describe many different lung problems that cause lung swelling (inflammation) and limit airflow. This includes chronic bronchitis and emphysema.  Exercise and physical activity improve your shortness of breath by increasing blood flow (circulation). This causes your heart to provide more oxygen to your body.  Contact your health care provider before starting any exercise program or new activity. Ask your health care provider what exercises and activities are safe for you. This information is not intended to replace advice given to you by your health care provider. Make sure you discuss any questions you have with your health care provider. Document Revised: 03/04/2019 Document Reviewed: 12/05/2017 Elsevier Patient Education  2020 Elsevier  Inc.     Instructions    Return in about 6 months (around 12/15/2020). Pleasure meeting you today Tiffany [name]  COPD - Continue Stiolto 2 puffs once daily in the morning (2 samples given today, we will look into applying for patient assistance) - Continue albuterol rescue inhaler 2 puffs every 4-6 hours as needed for breakthrough shortness of breath or wheezing - Recommend you consider getting Covid vaccine as discussed during visit today - Monitor for worsening shortness of breath or purulent mucus  production  Follow-up - 6 months with Dr. Loanne Drilling or sooner if needed   COPD and Physical Activity Chronic obstructive pulmonary disease (COPD) is a long-term (chronic) condition that affects the lungs. COPD is a general term that can be used to describe many different lung problems that cause lung swelling (inflammation) and limit airflow, including chronic bronchitis and emphysema. The main symptom of COPD is shortness of breath, which makes it harder to do even simple tasks. This can also make it harder to exercise and be active. Talk with your health care provider about treatments to help you breathe better and actions you can take to prevent breathing problems during physical activity. What are the benefits of exercising with COPD?  Exercising regularly is an important part of a healthy lifestyle. You can still exercise and do physical activities even though you have COPD. Exercise and physical activity improve your shortness of breath by increasing blood flow (circulation). This causes your heart to pump more oxygen through your body. Moderate exercise can improve your:  Oxygen use.  Energy level.  Shortness of breath.  Strength in your breathing muscles.  Heart health.  Sleep.  Self-esteem and feelings of self-worth.  Depression, stress, and anxiety levels. Exercise can benefit everyone with COPD. The severity of your disease may affect how hard you can exercise, especially at first, but everyone can benefit. Talk with your health care provider about how much exercise is safe for you, and which activities and exercises are safe for you. What actions can I take to prevent breathing problems during physical activity?  Sign up for a pulmonary rehabilitation program. This type of program may include: ? Education about lung diseases. ? Exercise classes that teach you how to exercise and be more active while improving your breathing. This usually involves:  Exercise using your  lower extremities, such as a stationary bicycle.  About 30 minutes of exercise, 2 to 5 times per week, for 6 to 12 weeks  Strength training, such as push ups or leg lifts. ? Nutrition education. ? Group classes in which you can talk with others who also have COPD and learn ways to manage stress.  If you use an oxygen tank, you should use it while you exercise. Work with your health care provider to adjust your oxygen for your physical activity. Your resting flow rate is different from your flow rate during physical activity.  While you are exercising: ? Take slow breaths. ? Pace yourself and do not try to go too fast. ? Purse your lips while breathing out. Pursing your lips is similar to a kissing or whistling position. ? If doing exercise that uses a quick burst of effort, such as weight lifting:  Breathe in before starting the exercise.  Breathe out during the hardest part of the exercise (such as raising the weights). Where to find support You can find support for exercising with COPD from:  Your health care provider.  A pulmonary rehabilitation program.  Your  local health department or community health programs.  Support groups, online or in-person. Your health care provider may be able to recommend support groups. Where to find more information You can find more information about exercising with COPD from:  American Lung Association: ClassInsider.se.  COPD Foundation: https://www.rivera.net/. Contact a health care provider if:  Your symptoms get worse.  You have chest pain.  You have nausea.  You have a fever.  You have trouble talking or catching your breath.  You want to start a new exercise program or a new activity. Summary  COPD is a general term that can be used to describe many different lung problems that cause lung swelling (inflammation) and limit airflow. This includes chronic bronchitis and emphysema.  Exercise and physical activity improve your shortness of  breath by increasing blood flow (circulation). This causes your heart to provide more oxygen to your body.  Contact your health care provider before starting any exercise program or new activity. Ask your health care provider what exercises and activities are safe for you. This information is not intended to replace advice given to you by your health care provider. Make sure you discuss any questions you have with your health care provider. Document Revised: 03/04/2019 Document Reviewed: 12/05/2017 Elsevier Patient Education  [place].         After Visit Summary (Printed 06/14/2020) Communications    [facility] Provider CC Chart Rep sent to Rita Ohara, MD   Chart Routed to Margaretha Seeds, MD Media From this encounter Electronic signature on 06/14/2020 11:25 AM - 1 of 3 e-signatures recorded  Communication Routing History  Recipient Method Sent by Date Blenda Bridegroom, MD In [place], NP 06/14/2020     No questionnaires available.           Orders Placed   None Medication Changes      (Patient Preference)   Medication List  Visit Diagnoses    Chronic obstructive pulmonary disease, unspecified COPD type ([place])   Problem List  Level of Service  Level of Service  PR OFFICE/OUTPATIENT ESTABLISHED LOW MDM 20-29 MIN [29244]  Log History  LOS History  All Charges for This Encounter  Code Description Service Date Service Provider Modifiers Qty  [phone] PR OFFICE/OUTPATIENT ESTABLISHED LOW MDM 20-29 MIN 06/14/2020 Martyn Ehrich, NP      Was appointment offered to patient (explain)?    Reason for call: Sore throat for 3 days,cough. Patient stated she has a low grade fever on Wednesday only.Mucus is green-yellowish. Patient stated she has tried OTC Mucinex. Patient does have COVID and flu vaccines.   Tiffany [name] can you please advise.  Thank you  No Known Allergies  Immunization History  Administered Date(s) Administered  . Fluad Quad(high Dose  65+) 09/15/2019, 08/08/2020  . Influenza Split 10/16/2012  . Influenza, High Dose Seasonal PF 09/02/2013, 08/25/2014, 08/31/2015, 11/01/2016, 09/09/2017, 09/09/2018  . PFIZER SARS-COV-2 Vaccination 07/01/2020, 07/22/2020  . Pneumococcal Conjugate-13 08/25/2014  . Pneumococcal Polysaccharide-23 12/12/2009, 08/31/2015  . Tdap 12/12/2009

## 2020-08-18 NOTE — Telephone Encounter (Signed)
Ill send in zpack. Recommend Salt water gargle and tylenol for sore throat. Delsym cough syrup twice daily. If symptoms do not improved or develops fever notify office. Needs covid testing if she has not been vaccinated.

## 2020-08-18 NOTE — Telephone Encounter (Signed)
See my message.

## 2020-09-14 ENCOUNTER — Emergency Department (HOSPITAL_COMMUNITY): Payer: Medicare Other

## 2020-09-14 ENCOUNTER — Inpatient Hospital Stay (HOSPITAL_COMMUNITY)
Admission: EM | Admit: 2020-09-14 | Discharge: 2020-09-16 | DRG: 190 | Disposition: A | Discharge status: Home / Self Care | Payer: Medicare Other | Attending: Internal Medicine | Admitting: Internal Medicine

## 2020-09-14 DIAGNOSIS — Z823 Family history of stroke: Secondary | ICD-10-CM

## 2020-09-14 DIAGNOSIS — R062 Wheezing: Secondary | ICD-10-CM | POA: Diagnosis not present

## 2020-09-14 DIAGNOSIS — R0602 Shortness of breath: Secondary | ICD-10-CM | POA: Diagnosis not present

## 2020-09-14 DIAGNOSIS — Z8249 Family history of ischemic heart disease and other diseases of the circulatory system: Secondary | ICD-10-CM

## 2020-09-14 DIAGNOSIS — J449 Chronic obstructive pulmonary disease, unspecified: Secondary | ICD-10-CM | POA: Diagnosis not present

## 2020-09-14 DIAGNOSIS — R778 Other specified abnormalities of plasma proteins: Secondary | ICD-10-CM | POA: Diagnosis present

## 2020-09-14 DIAGNOSIS — R0689 Other abnormalities of breathing: Secondary | ICD-10-CM | POA: Diagnosis not present

## 2020-09-14 DIAGNOSIS — R0902 Hypoxemia: Secondary | ICD-10-CM

## 2020-09-14 DIAGNOSIS — Z801 Family history of malignant neoplasm of trachea, bronchus and lung: Secondary | ICD-10-CM | POA: Diagnosis not present

## 2020-09-14 DIAGNOSIS — R069 Unspecified abnormalities of breathing: Secondary | ICD-10-CM | POA: Diagnosis not present

## 2020-09-14 DIAGNOSIS — E7800 Pure hypercholesterolemia, unspecified: Secondary | ICD-10-CM | POA: Diagnosis present

## 2020-09-14 DIAGNOSIS — E785 Hyperlipidemia, unspecified: Secondary | ICD-10-CM | POA: Diagnosis present

## 2020-09-14 DIAGNOSIS — R7303 Prediabetes: Secondary | ICD-10-CM | POA: Diagnosis present

## 2020-09-14 DIAGNOSIS — Z833 Family history of diabetes mellitus: Secondary | ICD-10-CM | POA: Diagnosis not present

## 2020-09-14 DIAGNOSIS — Z66 Do not resuscitate: Secondary | ICD-10-CM | POA: Diagnosis present

## 2020-09-14 DIAGNOSIS — J441 Chronic obstructive pulmonary disease with (acute) exacerbation: Secondary | ICD-10-CM | POA: Diagnosis not present

## 2020-09-14 DIAGNOSIS — Z87891 Personal history of nicotine dependence: Secondary | ICD-10-CM

## 2020-09-14 DIAGNOSIS — Z85828 Personal history of other malignant neoplasm of skin: Secondary | ICD-10-CM | POA: Diagnosis not present

## 2020-09-14 DIAGNOSIS — Z79899 Other long term (current) drug therapy: Secondary | ICD-10-CM | POA: Diagnosis not present

## 2020-09-14 DIAGNOSIS — I10 Essential (primary) hypertension: Secondary | ICD-10-CM | POA: Diagnosis present

## 2020-09-14 DIAGNOSIS — D472 Monoclonal gammopathy: Secondary | ICD-10-CM | POA: Diagnosis present

## 2020-09-14 DIAGNOSIS — J9601 Acute respiratory failure with hypoxia: Secondary | ICD-10-CM | POA: Diagnosis present

## 2020-09-14 DIAGNOSIS — Z20822 Contact with and (suspected) exposure to covid-19: Secondary | ICD-10-CM | POA: Diagnosis present

## 2020-09-14 DIAGNOSIS — Z803 Family history of malignant neoplasm of breast: Secondary | ICD-10-CM | POA: Diagnosis not present

## 2020-09-14 DIAGNOSIS — Z808 Family history of malignant neoplasm of other organs or systems: Secondary | ICD-10-CM

## 2020-09-14 LAB — BASIC METABOLIC PANEL
Anion gap: 12 (ref 5–15)
BUN: 9 mg/dL (ref 8–23)
CO2: 23 mmol/L (ref 22–32)
Calcium: 8.4 mg/dL — ABNORMAL LOW (ref 8.9–10.3)
Chloride: 100 mmol/L (ref 98–111)
Creatinine, Ser: 0.83 mg/dL (ref 0.44–1.00)
GFR, Estimated: >60 mL/min (ref >60)
Glucose, Bld: 204 mg/dL — ABNORMAL HIGH (ref 70–99)
Potassium: 3.5 mmol/L (ref 3.5–5.1)
Sodium: 135 mmol/L (ref 135–145)

## 2020-09-14 LAB — CBC WITH DIFFERENTIAL/PLATELET
Abs Immature Granulocytes: 0.11 10*3/uL — ABNORMAL HIGH (ref 0.00–0.07)
Basophils Absolute: 0 10*3/uL (ref 0.0–0.1)
Basophils Relative: 0 %
Eosinophils Absolute: 0 10*3/uL (ref 0.0–0.5)
Eosinophils Relative: 0 %
HCT: 44 % (ref 36.0–46.0)
Hemoglobin: 14 g/dL (ref 12.0–15.0)
Immature Granulocytes: 1 %
Lymphocytes Relative: 2 %
Lymphs Abs: 0.2 10*3/uL — ABNORMAL LOW (ref 0.7–4.0)
MCH: 30 pg (ref 26.0–34.0)
MCHC: 31.8 g/dL (ref 30.0–36.0)
MCV: 94.4 fL (ref 80.0–100.0)
Monocytes Absolute: 0.2 10*3/uL (ref 0.1–1.0)
Monocytes Relative: 1 %
Neutro Abs: 13.5 10*3/uL — ABNORMAL HIGH (ref 1.7–7.7)
Neutrophils Relative %: 96 %
Platelets: 188 10*3/uL (ref 150–400)
RBC: 4.66 MIL/uL (ref 3.87–5.11)
RDW: 12.4 % (ref 11.5–15.5)
WBC: 14.1 10*3/uL — ABNORMAL HIGH (ref 4.0–10.5)
nRBC: 0 % (ref 0.0–0.2)

## 2020-09-14 LAB — TROPONIN I (HIGH SENSITIVITY): Troponin I (High Sensitivity): 37 ng/L — ABNORMAL HIGH (ref <18)

## 2020-09-14 LAB — RESPIRATORY PANEL BY RT PCR (FLU A&B, COVID)
Influenza A by PCR: NEGATIVE
Influenza B by PCR: NEGATIVE
SARS Coronavirus 2 by RT PCR: NEGATIVE

## 2020-09-14 MED ORDER — ENOXAPARIN SODIUM 40 MG/0.4ML ~~LOC~~ SOLN
40.0000 mg | Freq: Every day | SUBCUTANEOUS | Status: DC
  Administered 2020-09-15 – 2020-09-16 (×2): 40 mg via SUBCUTANEOUS
  Filled 2020-09-14 (×2): qty 0.4

## 2020-09-14 MED ORDER — UMECLIDINIUM BROMIDE 62.5 MCG/INH IN AEPB
1.0000 | INHALATION_SPRAY | Freq: Every day | RESPIRATORY_TRACT | Status: DC
  Administered 2020-09-16: 1 via RESPIRATORY_TRACT
  Filled 2020-09-14: qty 7

## 2020-09-14 MED ORDER — IPRATROPIUM-ALBUTEROL 0.5-2.5 (3) MG/3ML IN SOLN
3.0000 mL | Freq: Four times a day (QID) | RESPIRATORY_TRACT | Status: DC
  Administered 2020-09-15 – 2020-09-16 (×6): 3 mL via RESPIRATORY_TRACT
  Filled 2020-09-14 (×5): qty 3

## 2020-09-14 MED ORDER — ONDANSETRON HCL 4 MG/2ML IJ SOLN: 4.0000 mg | Freq: Four times a day (QID) | INTRAMUSCULAR | Status: DC | PRN

## 2020-09-14 MED ORDER — PREDNISONE 20 MG PO TABS
40.0000 mg | ORAL_TABLET | Freq: Every day | ORAL | Status: DC
  Administered 2020-09-15: 40 mg via ORAL
  Filled 2020-09-14: qty 2

## 2020-09-14 MED ORDER — SIMVASTATIN 20 MG PO TABS
20.0000 mg | ORAL_TABLET | Freq: Every day | ORAL | Status: DC
  Administered 2020-09-15 – 2020-09-16 (×2): 20 mg via ORAL
  Filled 2020-09-14 (×2): qty 1

## 2020-09-14 MED ORDER — ACETAMINOPHEN 325 MG PO TABS: 650.0000 mg | ORAL_TABLET | Freq: Four times a day (QID) | ORAL | Status: DC | PRN

## 2020-09-14 MED ORDER — IPRATROPIUM-ALBUTEROL 0.5-2.5 (3) MG/3ML IN SOLN: 3.0000 mL | RESPIRATORY_TRACT | Status: DC | PRN

## 2020-09-14 MED ORDER — ONDANSETRON HCL 4 MG PO TABS: 4.0000 mg | ORAL_TABLET | Freq: Four times a day (QID) | ORAL | Status: DC | PRN

## 2020-09-14 MED ORDER — SODIUM CHLORIDE 0.9% FLUSH
3.0000 mL | Freq: Two times a day (BID) | INTRAVENOUS | Status: DC
  Administered 2020-09-15 – 2020-09-16 (×4): 3 mL via INTRAVENOUS

## 2020-09-14 MED ORDER — POLYETHYLENE GLYCOL 3350 17 G PO PACK: 17.0000 g | PACK | Freq: Every day | ORAL | Status: DC | PRN

## 2020-09-14 MED ORDER — LOSARTAN POTASSIUM 50 MG PO TABS
50.0000 mg | ORAL_TABLET | Freq: Every day | ORAL | Status: DC
  Administered 2020-09-15 – 2020-09-16 (×2): 50 mg via ORAL
  Filled 2020-09-14 (×2): qty 1

## 2020-09-14 MED ORDER — ARFORMOTEROL TARTRATE 15 MCG/2ML IN NEBU
15.0000 ug | INHALATION_SOLUTION | Freq: Two times a day (BID) | RESPIRATORY_TRACT | Status: DC
  Administered 2020-09-15 – 2020-09-16 (×3): 15 ug via RESPIRATORY_TRACT
  Filled 2020-09-14 (×5): qty 2

## 2020-09-14 MED ORDER — ACETAMINOPHEN 650 MG RE SUPP: 650.0000 mg | Freq: Four times a day (QID) | RECTAL | Status: DC | PRN

## 2020-09-14 MED ORDER — ALBUTEROL SULFATE (2.5 MG/3ML) 0.083% IN NEBU: 2.5000 mg | INHALATION_SOLUTION | Freq: Four times a day (QID) | RESPIRATORY_TRACT | Status: DC | PRN

## 2020-09-14 MED ORDER — IPRATROPIUM-ALBUTEROL 0.5-2.5 (3) MG/3ML IN SOLN: 3.0000 mL | Freq: Four times a day (QID) | RESPIRATORY_TRACT | Status: DC

## 2020-09-14 NOTE — ED Provider Notes (Signed)
[facility] EMERGENCY DEPARTMENT Provider Note   CSN: [id number] Arrival date & time: 09/14/20  2003     History Chief Complaint  Patient presents with  . Wheezing    Tiffany [name] is a 74 y.o. female.  Patient with history of COPD, followed by [facility] pulmonology. Patient developed nasal congestion with sore throat yesterday, with exacerbation of COPD this evening. EMS reports patient was silent in all fields, received albuterol, atrovent, solu-medrol, and magnesium. After treatment, patient with bilateral inspiratory and expiratory wheezing. No chest pain, cough, fever, chills. She is not on oxygen at home.  The history is provided by the patient and medical records. No language interpreter was used.  Wheezing Severity:  Moderate Severity compared to prior episodes:  More severe Duration:  1 day Timing:  Sporadic Chronicity:  Recurrent Associated symptoms: shortness of breath and sore throat   Associated symptoms: no chest pain, no cough and no fever        Past Medical History:  Diagnosis Date  . Abnormal PFT 09/02/14   moderate restrictive ventilatory defect.  Marland Kitchen BCC (basal cell carcinoma), face 2010   Dr. Allyson Sabal  . Colon polyp 2/05  . Diverticulosis 11/2010   seen on colonoscopy and air contrast BE  . Emphysema of lung ([place])   . Hyperlipidemia   . Hypertension    previously treated with meds, resolved  . MGUS (monoclonal gammopathy of unknown significance) 1999   prevously under care of Dr. Learta Codding  . Osteopenia    DEXA 01/31/2010 at [facility]; 2014 at [facility]  . 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 Tobacco abuse     Patient Active Problem List   Diagnosis Date Noted  . Aortic atherosclerosis ([place]) 06/03/2018  . Atherosclerosis of right coronary artery 06/03/2018  . Essential hypertension 12/31/2016  . MGUS (monoclonal gammopathy of unknown significance) 10/30/2016  . Senile purpura ([place]) 08/31/2015  . COPD  (chronic obstructive pulmonary disease) ([place]) 02/24/2015  . Osteopenia 05/12/2012  . Vitamin D deficiency 04/28/2012  . Pure hypercholesterolemia 04/28/2012  . Former smoker 04/03/2012    Past Surgical History:  Procedure Laterality Date  . BASAL CELL CARCINOMA EXCISION  2010   L face, R temple  . BILATERAL SALPINGOOPHORECTOMY  2007   cancerous tumor R ovary  . CATARACT EXTRACTION  2009   bilateral  . COLONOSCOPY  11/2010   Dr. Wynetta Emery. Diverticulosis. BE also done--normal  . OMENTECTOMY  2007   and pelvic lymphadenectomy (with BSO)  . TONSILLECTOMY  age 51  . VAGINAL HYSTERECTOMY  late 20's     OB History    Gravida  1   Para  1   Term      Preterm      AB      Living  1     SAB      TAB      Ectopic      Multiple      Live Births              Family History  Problem Relation Age of Onset  . Stroke Mother   . Cerebral aneurysm Mother   . Hypertension Father   . Suicidality Father   . Diabetes Maternal Aunt   . Diabetes Maternal Aunt   . Diabetes Maternal Aunt   . Throat cancer Maternal Uncle   . Breast cancer Maternal Aunt        2 aunts  .  Lung cancer Maternal Uncle     Social History   Tobacco Use  . Smoking status: Former Smoker    Packs/day: 2.00    Years: 59.00    Pack years: 118.00    Types: Cigarettes    Start date: 26    Quit date: 09/10/2018    Years since quitting: 2.0  . Smokeless tobacco: Never Used  Vaping Use  . Vaping Use: Never used  Substance Use Topics  . Alcohol use: Yes    Alcohol/week: 0.0 standard drinks    Comment: maybe 1-2 times per year. (rare beer in the summer)  . Drug use: No    Home Medications Prior to Admission medications   Medication Sig Start Date End Date Taking? Authorizing Provider  albuterol (VENTOLIN HFA) 108 (90 Base) MCG/ACT inhaler Inhale 2 puffs into the lungs every 6 (six) hours as needed for wheezing or shortness of breath. 05/07/19 11/23/23  Rita Ohara, MD  aspirin EC 81 MG  tablet Take 1 tablet (81 mg total) by mouth daily. Patient not taking: Reported on 08/08/2020 12/31/16   End, Harrell Gave, MD  azithromycin [facility]) 250 MG tablet Zpack taper as directed 08/18/20   Martyn Ehrich, NP  Calcium-Vitamin D (CALTRATE 600 PLUS-VIT D PO) Take 1 each by mouth daily. Patient not taking: Reported on 08/08/2020    [provider]  Cholecalciferol (VITAMIN D3) 125 MCG (5000 UT) CAPS Take 1 capsule by mouth daily.  Patient not taking: Reported on 08/08/2020    [provider]  ergocalciferol (VITAMIN D2) 1.25 MG (50000 UT) capsule Take 1 capsule (50,000 Units total) by mouth once a week. 08/10/20   Rita Ohara, MD  fluticasone (FLONASE) 50 MCG/ACT nasal spray Place 2 sprays into both nostrils daily. Patient not taking: Reported on 08/08/2020 09/17/18   Florencia Reasons, MD  losartan (COZAAR) 50 MG tablet Take 1 tablet (50 mg total) by mouth daily. 08/08/20   Rita Ohara, MD  simvastatin (ZOCOR) 20 MG tablet Take 1 tablet (20 mg total) by mouth daily. 08/08/20   Rita Ohara, MD  Tiotropium Bromide-Olodaterol (STIOLTO RESPIMAT) 2.5-2.5 MCG/ACT AERS Inhale 2 puffs into the lungs daily. 07/15/20   Martyn Ehrich, NP    Allergies    Patient has no known allergies.  Review of Systems   Review of Systems  Constitutional: Negative for fever.  HENT: Positive for sore throat.   Respiratory: Positive for shortness of breath and wheezing. Negative for cough.   Cardiovascular: Negative for chest pain.  All other systems reviewed and are negative.   Physical Exam Updated Vital Signs BP (!) 152/59 (BP Location: Right Arm)   Pulse (!) 104   Temp 98.4 F (36.9 C) (Oral)   Resp (!) 21   SpO2 95% Comment: 5L [place] \  Physical Exam Vitals and nursing note reviewed.  Constitutional:      Appearance: Normal appearance. She is not toxic-appearing.  HENT:     Nose: Congestion present.     Mouth/Throat:     Mouth: Mucous membranes are moist.  Eyes:     Conjunctiva/sclera:  Conjunctivae normal.  Cardiovascular:     Rate and Rhythm: Tachycardia present.  Pulmonary:     Effort: Respiratory distress present.     Breath sounds: Examination of the right-upper field reveals wheezing. Examination of the left-upper field reveals wheezing. Examination of the right-middle field reveals wheezing. Examination of the right-lower field reveals wheezing. Examination of the left-lower field reveals decreased breath sounds. Decreased breath sounds  and wheezing present. No rales.  Abdominal:     Palpations: Abdomen is soft.  Musculoskeletal:     Cervical back: Neck supple.     Right lower leg: No edema.     Left lower leg: No edema.  Skin:    General: Skin is warm and dry.  Neurological:     Mental Status: She is alert and oriented to person, place, and time.  Psychiatric:        Mood and Affect: Mood normal.        Behavior: Behavior normal.     ED Results / Procedures / Treatments   Labs (all labs ordered are listed, but only abnormal results are displayed) Labs Reviewed - No data to display  EKG None  Radiology DG Chest [facility] 1 View  Result Date: 09/14/2020 CLINICAL DATA:  Shortness of breath, wheezing EXAM: PORTABLE CHEST 1 VIEW COMPARISON:  09/10/2018 FINDINGS: There is hyperinflation of the lungs compatible with COPD. Heart and mediastinal contours are within normal limits. No focal opacities or effusions. No acute bony abnormality. IMPRESSION: COPD.  No active disease. Electronically Signed   By: Rolm Baptise M.D.   On: 09/14/2020 20:40    Procedures Procedures (including critical care time)  Medications Ordered in ED Medications - No data to display  ED Course  I have reviewed the triage vital signs and the nursing notes.  Pertinent labs & imaging results that were available during my care of the patient were reviewed by me and considered in my medical decision making (see chart for details).   Patient's oxygen saturations drop below 90 when  supplemental oxygen is below 3 LPM via nasal cannula.   MDM Rules/Calculators/A&P                          Patient with shortness of breath and requiring oxygen to maintain oxygen saturation. She is not on oxygen at home. No pneumonia on xray. Basic labs including COVID ordered and pending. Likely admission.  Patient signed out to Dr. Eulis Foster.    Final Clinical Impression(s) / ED Diagnoses Final diagnoses:  COPD with acute exacerbation [facility])    Rx / DC Orders ED Discharge Orders    None       Etta Quill, NP 09/14/20 [id number]    Daleen Bo, MD 09/14/20 [phone]

## 2020-09-14 NOTE — ED Triage Notes (Signed)
Per ems pt coming from home c/o congestion/sob/sore throat since yesterday that has been getting worse. Husband called and when ems arrived patient was 84% on RA. Pt has hx of COPD. Per ems pt was silent in bilateral lung fields and after albuterol/;mag/solumedrol/atrovert bilateral inspiratory and expiratory wheezes could be heard. Patient had an upper respiratory infection last month and was prescribed zpack that cleared it up. Pt Alert and oriented x4.

## 2020-09-14 NOTE — H&P (Signed)
History and Physical   Tiffany [name]:[id number] DOB: December 20, 1945 DOA: 09/14/2020  PCP: Rita Ohara, MD   Patient coming from: Home  Chief Complaint: Worsening dyspnea  HPI: Tiffany [name] is a 74 y.o. female with medical history significant of COPD, MGUS, hypertension, hyperlipidemia, atherosclerosis who presented with worsening shortness of breath.  Patient reports that a few days ago she developed congestion and a sore throat.  Yesterday evening she began to experience increased dyspnea which continued into this morning with no relief. She tried her home albuterol and Stiolto without relief.  She called EMS who noted wheezing on their exam and desaturations to 85% on room air.  EMS gave the patient albuterol, Atrovent, Solu-Medrol, and magnesium.  Patient now requiring 3 L to maintain saturations in ED.  Patient reports some dry heaves this morning.   She denies fevers, chest pain, abdominal pain, vomiting, constipation, or diarrhea. . ED Course: Vital signs in ED were stable, but did require 3 L to maintain saturations.  Lab work-up was significant for WBC of 14.  Initial troponin mildly elevated to 37.  Respiratory panel of Covid and flu was negative.  Chest x-ray without acute disease.   Review of Systems: As per HPI otherwise all other systems reviewed and are negative.  Past Medical History:  Diagnosis Date  . Abnormal PFT 09/02/14   moderate restrictive ventilatory defect.  Marland Kitchen BCC (basal cell carcinoma), face 2010   Dr. Allyson Sabal  . Colon polyp 2/05  . Diverticulosis 11/2010   seen on colonoscopy and air contrast BE  . Emphysema of lung ([place])   . Hyperlipidemia   . Hypertension    previously treated with meds, resolved  . MGUS (monoclonal gammopathy of unknown significance) 1999   prevously under care of Dr. Learta Codding  . Osteopenia    DEXA 01/31/2010 at [facility]; 2014 at [facility]  . 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 Tobacco abuse     Past  Surgical History:  Procedure Laterality Date  . BASAL CELL CARCINOMA EXCISION  2010   L face, R temple  . BILATERAL SALPINGOOPHORECTOMY  2007   cancerous tumor R ovary  . CATARACT EXTRACTION  2009   bilateral  . COLONOSCOPY  11/2010   Dr. Wynetta Emery. Diverticulosis. BE also done--normal  . OMENTECTOMY  2007   and pelvic lymphadenectomy (with BSO)  . TONSILLECTOMY  age 62  . VAGINAL HYSTERECTOMY  late 20's    Social History  reports that she quit smoking about 2 years ago. Her smoking use included cigarettes. She started smoking about 61 years ago. She has a 118.00 pack-year smoking history. She has never used smokeless tobacco. She reports current alcohol use. She reports that she does not use drugs.  No Known Allergies  Family History  Problem Relation Age of Onset  . Stroke Mother   . Cerebral aneurysm Mother   . Hypertension Father   . Suicidality Father   . Diabetes Maternal Aunt   . Diabetes Maternal Aunt   . Diabetes Maternal Aunt   . Throat cancer Maternal Uncle   . Breast cancer Maternal Aunt        2 aunts  . Lung cancer Maternal Uncle   Reviewed on admission  Prior to Admission medications   Medication Sig Start Date End Date Taking? Authorizing Provider  albuterol (VENTOLIN HFA) 108 (90 Base) MCG/ACT inhaler Inhale 2 puffs into the lungs every 6 (six) hours as needed for wheezing  or shortness of breath. 05/07/19 11/23/23  Rita Ohara, MD  aspirin EC 81 MG tablet Take 1 tablet (81 mg total) by mouth daily. Patient not taking: Reported on 08/08/2020 12/31/16   End, Harrell Gave, MD  azithromycin [facility]) 250 MG tablet Zpack taper as directed 08/18/20   Martyn Ehrich, NP  Calcium-Vitamin D (CALTRATE 600 PLUS-VIT D PO) Take 1 each by mouth daily. Patient not taking: Reported on 08/08/2020    [provider]  Cholecalciferol (VITAMIN D3) 125 MCG (5000 UT) CAPS Take 1 capsule by mouth daily.  Patient not taking: Reported on 08/08/2020    [provider]   ergocalciferol (VITAMIN D2) 1.25 MG (50000 UT) capsule Take 1 capsule (50,000 Units total) by mouth once a week. 08/10/20   Rita Ohara, MD  fluticasone (FLONASE) 50 MCG/ACT nasal spray Place 2 sprays into both nostrils daily. Patient not taking: Reported on 08/08/2020 09/17/18   Florencia Reasons, MD  losartan (COZAAR) 50 MG tablet Take 1 tablet (50 mg total) by mouth daily. 08/08/20   Rita Ohara, MD  simvastatin (ZOCOR) 20 MG tablet Take 1 tablet (20 mg total) by mouth daily. 08/08/20   Rita Ohara, MD  Tiotropium Bromide-Olodaterol (STIOLTO RESPIMAT) 2.5-2.5 MCG/ACT AERS Inhale 2 puffs into the lungs daily. 07/15/20   Martyn Ehrich, NP    Physical Exam: Vitals:   09/14/20 2300 09/14/20 2315 09/14/20 2330 09/15/20 0000  BP: (!) 120/59   135/62  Pulse: 84 95 84 83  Resp: 15 (!) 22 15 17   Temp:      TempSrc:      SpO2: 94% 94% 94% 93%    Physical Exam Constitutional:      General: She is not in acute distress.    Appearance: Normal appearance.     Comments: Elderly female  HENT:     Head: Normocephalic and atraumatic.     Mouth/Throat:     Mouth: Mucous membranes are moist.     Pharynx: Oropharynx is clear.  Eyes:     Extraocular Movements: Extraocular movements intact.     Pupils: Pupils are equal, round, and reactive to light.  Cardiovascular:     Rate and Rhythm: Normal rate and regular rhythm.     Pulses: Normal pulses.     Heart sounds: Normal heart sounds.  Pulmonary:     Effort: No respiratory distress.     Comments: Diffuse wheezing bilaterally Abdominal:     General: Bowel sounds are normal. There is no distension.     Palpations: Abdomen is soft.     Tenderness: There is no abdominal tenderness.  Musculoskeletal:        General: No swelling or deformity.  Skin:    General: Skin is warm and dry.  Neurological:     General: No focal deficit present.     Mental Status: Mental status is at baseline.    Labs on Admission: I have personally reviewed following labs and  imaging studies  CBC: Recent Labs  Lab 09/14/20 2215  WBC 14.1*  NEUTROABS 13.5*  HGB 14.0  HCT 44.0  MCV 94.4  PLT 818    Basic Metabolic Panel: Recent Labs  Lab 09/14/20 2215  NA 135  K 3.5  CL 100  CO2 23  GLUCOSE 204*  BUN 9  CREATININE 0.83  CALCIUM 8.4*    GFR: CrCl cannot be calculated (Unknown ideal weight.).  Liver Function Tests: No results for input(s): AST, ALT, ALKPHOS, BILITOT, PROT, ALBUMIN in the last 168  hours.  Urine analysis:    Component Value Date/Time   LABSPEC 1.015 12/03/2018 0902   BILIRUBINUR negative 12/03/2018 0902   BILIRUBINUR neg 11/01/2016 1530   KETONESUR negative 12/03/2018 0902   PROTEINUR negative 12/03/2018 0902   PROTEINUR neg 11/01/2016 1530   UROBILINOGEN negative 11/01/2016 1530   NITRITE Negative 12/03/2018 0902   NITRITE neg 11/01/2016 1530   LEUKOCYTESUR Negative 12/03/2018 0902    Radiological Exams on Admission: DG Chest Port 1 View  Result Date: 09/14/2020 CLINICAL DATA:  Shortness of breath, wheezing EXAM: PORTABLE CHEST 1 VIEW COMPARISON:  09/10/2018 FINDINGS: There is hyperinflation of the lungs compatible with COPD. Heart and mediastinal contours are within normal limits. No focal opacities or effusions. No acute bony abnormality. IMPRESSION: COPD.  No active disease. Electronically Signed   By: Rolm Baptise M.D.   On: 09/14/2020 20:40    EKG: Independently reviewed.  Sinus tachycardia,.  Q waves in V1 V2 and V3, similar to previous  Assessment/Plan Principal Problem:   COPD exacerbation (HCC) Active Problems:   Pure hypercholesterolemia   Essential hypertension  COPD exacerbation - Patient noted to have URI symptoms for 2 to 3 days, worsening shortness of breath beginning yesterday evening. - Does not use oxygen at baseline, now requiring 3 L to maintain saturations - Received albuterol, Atrovent, Solu-Medrol, and magnesium with EMS - Chest x-ray showed no acute disease - Covid and pneumonia  panel negative we will add on full viral panel, mild leukocytosis to 14 but this is after Solu-Medrol by EMS - Patient does report increased sputum production, will add azithromycin - Patient's home inhaler is nonformulary will replace with Incruse Ellipta and Brovana - Prednisone 40mg  Daily - DuoNeb's every 4 hours while awake - As needed albuterol  Troponinemia - High-sensitivity troponin mildly elevated at 37 ED - We will repeat troponin, suspect demand ischemia in the setting of hypoxia  Hypertension -Continue home losartan  Hyperlipidemia -Continue home simvastatin  DVT prophylaxis: Lovenox  Code Status:   Partial, DNR/DNI, BiPAP okay  Family Communication:  None on admission   Disposition Plan:   Patient is from:  Home  Anticipated DC to:  Home  Anticipated DC date:  Pending response to therapy    Consults called:  None  Admission status:  Inpatient, telemetry   Severity of Illness: The appropriate patient status for this patient is INPATIENT. Inpatient status is judged to be reasonable and necessary in order to provide the required intensity of service to ensure the patient's safety. The patient's presenting symptoms, physical exam findings, and initial radiographic and laboratory data in the context of their chronic comorbidities is felt to place them at high risk for further clinical deterioration. Furthermore, it is not anticipated that the patient will be medically stable for discharge from the hospital within 2 midnights of admission. The following factors support the patient status of inpatient.   " The patient's presenting symptoms include shortness of breath, hypoxia. " The worrisome physical exam findings include diffuse wheezing. " The initial radiographic and laboratory data are worrisome because of mild troponin elevation. " The chronic co-morbidities include hyperlipidemia, MGUS, hypertension, atherosclerosis.   * I certify that at the point of admission it  is my clinical judgment that the patient will require inpatient hospital care spanning beyond 2 midnights from the point of admission due to high intensity of service, high risk for further deterioration and high frequency of surveillance required.Marcelyn Bruins MD [facility]  How  to contact the [facility] Attending or Consulting provider Central or covering provider during after hours Index, for this patient?   1. Check the care team in [facility] and look for a) attending/consulting [facility] provider listed and b) the [facility] team listed 2. Log into [email or web address] and use [place]'s universal password to access. If you do not have the password, please contact the hospital operator. 3. Locate the [facility] provider you are looking for under [facility] and page to a number that you can be directly reached. 4. If you still have difficulty reaching the provider, please page the [facility] (Director on Call) for the Hospitalists listed on amion for assistance.  09/15/2020, 12:21 AM

## 2020-09-14 NOTE — ED Provider Notes (Signed)
  Face-to-face evaluation   History: She is here for evaluation of upper respiratory symptoms, and hypoxia.  She has history of COPD.  She was evaluated by EMS in the field, found to be in respiratory distress with oxygen saturation 84%.  Both improved with treatment including bronchodilators, steroids, magnesium, and aspirin by nasal cannula.  Physical exam: Elderly female alert cooperative.  On oxygen she is comfortable.  Lungs with decreased air movement bilaterally scattered rhonchi and poor air movement.  No respiratory distress.  Medical screening examination/treatment/procedure(s) were conducted as a shared visit with non-physician practitioner(s) and myself.  I personally evaluated the patient during the encounter  Case discussed with hospitalist to arrange admission, 11:33 PM    Daleen Bo, MD 09/14/20 2334

## 2020-09-15 ENCOUNTER — Other Ambulatory Visit: Payer: Self-pay

## 2020-09-15 LAB — BASIC METABOLIC PANEL
Anion gap: 11 (ref 5–15)
BUN: 10 mg/dL (ref 8–23)
CO2: 22 mmol/L (ref 22–32)
Calcium: 8.6 mg/dL — ABNORMAL LOW (ref 8.9–10.3)
Chloride: 100 mmol/L (ref 98–111)
Creatinine, Ser: 0.95 mg/dL (ref 0.44–1.00)
GFR, Estimated: 59 mL/min — ABNORMAL LOW (ref >60)
Glucose, Bld: 220 mg/dL — ABNORMAL HIGH (ref 70–99)
Potassium: 3.8 mmol/L (ref 3.5–5.1)
Sodium: 133 mmol/L — ABNORMAL LOW (ref 135–145)

## 2020-09-15 LAB — GLUCOSE, CAPILLARY
Glucose-Capillary: 150 mg/dL — ABNORMAL HIGH (ref 70–99)
Glucose-Capillary: 165 mg/dL — ABNORMAL HIGH (ref 70–99)
Glucose-Capillary: 169 mg/dL — ABNORMAL HIGH (ref 70–99)
Glucose-Capillary: 139 mg/dL — ABNORMAL HIGH (ref 70–99)

## 2020-09-15 LAB — CBC
HCT: 43.3 % (ref 36.0–46.0)
Hemoglobin: 13.9 g/dL (ref 12.0–15.0)
MCH: 30 pg (ref 26.0–34.0)
MCHC: 32.1 g/dL (ref 30.0–36.0)
MCV: 93.3 fL (ref 80.0–100.0)
Platelets: 199 10*3/uL (ref 150–400)
RBC: 4.64 MIL/uL (ref 3.87–5.11)
RDW: 12.5 % (ref 11.5–15.5)
WBC: 10.8 10*3/uL — ABNORMAL HIGH (ref 4.0–10.5)
nRBC: 0 % (ref 0.0–0.2)

## 2020-09-15 MED ORDER — INSULIN ASPART 100 UNIT/ML ~~LOC~~ SOLN: 0.0000 [IU] | Freq: Every day | SUBCUTANEOUS | Status: DC

## 2020-09-15 MED ORDER — INSULIN GLARGINE 100 UNIT/ML ~~LOC~~ SOLN
5.0000 [IU] | Freq: Every day | SUBCUTANEOUS | Status: DC
  Administered 2020-09-15 – 2020-09-16 (×2): 5 [IU] via SUBCUTANEOUS
  Filled 2020-09-15 (×4): qty 0.05

## 2020-09-15 MED ORDER — METHYLPREDNISOLONE SODIUM SUCC 40 MG IJ SOLR
40.0000 mg | Freq: Two times a day (BID) | INTRAMUSCULAR | Status: DC
  Administered 2020-09-15 – 2020-09-16 (×3): 40 mg via INTRAVENOUS
  Filled 2020-09-15 (×3): qty 1

## 2020-09-15 MED ORDER — AZITHROMYCIN 250 MG PO TABS
500.0000 mg | ORAL_TABLET | Freq: Every day | ORAL | Status: AC
  Administered 2020-09-15: 500 mg via ORAL
  Filled 2020-09-15: qty 2

## 2020-09-15 MED ORDER — AZITHROMYCIN 250 MG PO TABS
250.0000 mg | ORAL_TABLET | Freq: Every day | ORAL | Status: DC
  Administered 2020-09-16: 250 mg via ORAL
  Filled 2020-09-15: qty 1

## 2020-09-15 MED ORDER — ORAL CARE MOUTH RINSE
15.0000 mL | Freq: Two times a day (BID) | OROMUCOSAL | Status: DC
  Administered 2020-09-15 – 2020-09-16 (×2): 15 mL via OROMUCOSAL

## 2020-09-15 MED ORDER — INSULIN ASPART 100 UNIT/ML ~~LOC~~ SOLN
0.0000 [IU] | Freq: Three times a day (TID) | SUBCUTANEOUS | Status: DC
  Administered 2020-09-15 – 2020-09-16 (×3): 2 [IU] via SUBCUTANEOUS

## 2020-09-15 NOTE — Progress Notes (Signed)
PROGRESS NOTE  Tiffany [name]  DOB: 12-31-1945  PCP: Rita Ohara, MD BTD:[id number]  DOA: 09/14/2020  LOS: 1 day   Chief Complaint  Patient presents with  . Wheezing   Brief narrative: Tiffany [name] is a 74 y.o. female with medical history significant of COPD, MGUS, hypertension, hyperlipidemia, atherosclerosis who presented with worsening shortness of breath.   Few days prior to presentation, patient started having upper respiratory tract congestion and sore throat. 1 day prior to presentation, she started having dyspnea which continued overnight and did not respond to bronchodilators.  EMS noted her wheezing with oxygen saturation down to 85%. She was given albuterol, Atrovent, Solu-Medrol, and magnesium and brought to the ED.  In the ED, she was able to maintain oxygen saturation on 2 L per nasal cannula. WBC count elevated to 14,000, Covid PCR and influenza panel negative. Chest x-ray without acute disease.   Subjective: Patient was seen and examined this morning. Pleasant elderly Caucasian female. Sitting up at the edge of the bed. Not in distress. She was just back from the bathroom and diffuse wheezing.  Assessment/Plan: COPD exacerbation -Started as URI symptoms are progressed to dyspnea  -Chest x-ray without infiltrates.  -Continue short course of azithromycin. Currently on oral prednisone. Because of diffuse wheezing, will switch to IV Solu-Medrol. Continue bronchodilators.  -Pending sputum culture.  Acute respiratory failure with hypoxia -Not on supplemental oxygen at home.  -Currently on 2 L oxygen by nasal cannula. Wean down as tolerated.   Troponinemia - High-sensitivity troponin mildly elevated at 37 ED - We will repeat troponin, suspect demand ischemia in the setting of hypoxia  Hypertension -Continue home losartan  Hyperlipidemia -Continue home simvastatin  Mobility: Encourage ambulation Code Status:  yes to BiPAP, no to CPR or  intubation Nutritional status: There is no height or weight on file to calculate BMI.     Diet Order            Diet Heart Room service appropriate? Yes; Fluid consistency: Thin  Diet effective now                 DVT prophylaxis: enoxaparin (LOVENOX) injection 40 mg Start: 09/15/20 1000   Antimicrobials:  Azithromycin Fluid: None Consultants: None Family Communication:  None at bedside Status is: Inpatient  Remains inpatient appropriate because: Patient has diffuse bilateral wheezing   Dispo: The patient is from: Home              Anticipated d/c is to: Home              Anticipated d/c date is: 2 days              Patient currently is not medically stable to d/c.       Infusions:    Scheduled Meds: . arformoterol  15 mcg Nebulization BID  . [START ON 09/16/2020] azithromycin  250 mg Oral Daily  . enoxaparin (LOVENOX) injection  40 mg Subcutaneous Daily  . insulin aspart  0-5 Units Subcutaneous QHS  . insulin aspart  0-9 Units Subcutaneous TID WC  . insulin glargine  5 Units Subcutaneous Daily  . ipratropium-albuterol  3 mL Nebulization Q6H WA  . losartan  50 mg Oral Daily  . mouth rinse  15 mL Mouth Rinse BID  . methylPREDNISolone (SOLU-MEDROL) injection  40 mg Intravenous Q12H  . simvastatin  20 mg Oral Daily  . sodium chloride flush  3 mL Intravenous Q12H  . umeclidinium bromide  1 puff Inhalation  Daily    Antimicrobials: Anti-infectives (From admission, onward)   Start     Dose/Rate Route Frequency Ordered Stop   09/16/20 1000  azithromycin (ZITHROMAX) tablet 250 mg       "Followed by" Linked Group Details   250 mg Oral Daily 09/15/20 0037 09/20/20 0959   09/15/20 0100  azithromycin (ZITHROMAX) tablet 500 mg       "Followed by" Linked Group Details   500 mg Oral Daily 09/15/20 0037 09/15/20 0059      PRN meds: acetaminophen **OR** acetaminophen, albuterol, ondansetron **OR** ondansetron (ZOFRAN) IV, polyethylene glycol   Objective: Vitals:    09/15/20 0358 09/15/20 0733  BP: (!) 159/70   Pulse: 89 73  Resp: 20   Temp: 98.6 F (37 C)   SpO2: 97% 93%    Intake/Output Summary (Last 24 hours) at 09/15/2020 1416 Last data filed at 09/15/2020 2595 Gross per 24 hour  Intake 240 ml  Output --  Net 240 ml   There were no vitals filed for this visit. Weight change:  There is no height or weight on file to calculate BMI.   Physical Exam: General exam: Appears calm and comfortable. Not in physical distress Skin: No rashes, lesions or ulcers. HEENT: Atraumatic, normocephalic, supple neck, no obvious bleeding Lungs: Diffuse bilateral wheezing after minimal exertion CVS: Regular rate and rhythm, no murmur GI/Abd soft, nontender, nondistended, bowel sound present CNS: Alert, awake, oriented x3 Psychiatry: Mood appropriate Extremities: No pedal edema, no calf tenderness  Data Review: I have personally reviewed the laboratory data and studies available.  Recent Labs  Lab 09/14/20 2215 09/15/20 0326  WBC 14.1* 10.8*  NEUTROABS 13.5*  --   HGB 14.0 13.9  HCT 44.0 43.3  MCV 94.4 93.3  PLT 188 199   Recent Labs  Lab 09/14/20 2215 09/15/20 0326  NA 135 133*  K 3.5 3.8  CL 100 100  CO2 23 22  GLUCOSE 204* 220*  BUN 9 10  CREATININE 0.83 0.95  CALCIUM 8.4* 8.6*    F/u labs ordered  Signed, Terrilee Croak, MD [facility] 09/15/2020

## 2020-09-15 NOTE — Progress Notes (Signed)
Pt arrived to 2w06. Alert and oriented x 4, identified appropriately. No chest pain, SOB noted with exertion, pt still able to ambulate with steady gait.  Cardiac monitor and continuous pulse ox in place and CCMD notified. VS stable, no signs of acute distress. Pt instructed to call for assistance, instructed on how to use call bell. Call bell left within pt reach. Will continue to monitor pt.

## 2020-09-16 LAB — CBC WITH DIFFERENTIAL/PLATELET
Abs Immature Granulocytes: 0.11 10*3/uL — ABNORMAL HIGH (ref 0.00–0.07)
Basophils Absolute: 0 10*3/uL (ref 0.0–0.1)
Basophils Relative: 0 %
Eosinophils Absolute: 0 10*3/uL (ref 0.0–0.5)
Eosinophils Relative: 0 %
HCT: 38.5 % (ref 36.0–46.0)
Hemoglobin: 12.6 g/dL (ref 12.0–15.0)
Immature Granulocytes: 1 %
Lymphocytes Relative: 4 %
Lymphs Abs: 0.5 10*3/uL — ABNORMAL LOW (ref 0.7–4.0)
MCH: 30.6 pg (ref 26.0–34.0)
MCHC: 32.7 g/dL (ref 30.0–36.0)
MCV: 93.4 fL (ref 80.0–100.0)
Monocytes Absolute: 0.8 10*3/uL (ref 0.1–1.0)
Monocytes Relative: 6 %
Neutro Abs: 13.6 10*3/uL — ABNORMAL HIGH (ref 1.7–7.7)
Neutrophils Relative %: 89 %
Platelets: 198 10*3/uL (ref 150–400)
RBC: 4.12 MIL/uL (ref 3.87–5.11)
RDW: 12.7 % (ref 11.5–15.5)
WBC: 15.1 10*3/uL — ABNORMAL HIGH (ref 4.0–10.5)
nRBC: 0 % (ref 0.0–0.2)

## 2020-09-16 LAB — HEMOGLOBIN A1C
Hgb A1c MFr Bld: 5.9 % — ABNORMAL HIGH (ref 4.8–5.6)
Mean Plasma Glucose: 123 mg/dL

## 2020-09-16 LAB — BASIC METABOLIC PANEL
Anion gap: 9 (ref 5–15)
BUN: 17 mg/dL (ref 8–23)
CO2: 25 mmol/L (ref 22–32)
Calcium: 8.7 mg/dL — ABNORMAL LOW (ref 8.9–10.3)
Chloride: 100 mmol/L (ref 98–111)
Creatinine, Ser: 0.89 mg/dL (ref 0.44–1.00)
GFR, Estimated: >60 mL/min (ref >60)
Glucose, Bld: 143 mg/dL — ABNORMAL HIGH (ref 70–99)
Potassium: 4 mmol/L (ref 3.5–5.1)
Sodium: 134 mmol/L — ABNORMAL LOW (ref 135–145)

## 2020-09-16 LAB — GLUCOSE, CAPILLARY
Glucose-Capillary: 155 mg/dL — ABNORMAL HIGH (ref 70–99)
Glucose-Capillary: 90 mg/dL (ref 70–99)

## 2020-09-16 LAB — MAGNESIUM: Magnesium: 2.2 mg/dL (ref 1.7–2.4)

## 2020-09-16 LAB — PHOSPHORUS: Phosphorus: 2.3 mg/dL — ABNORMAL LOW (ref 2.5–4.6)

## 2020-09-16 MED ORDER — AZITHROMYCIN 250 MG PO TABS: 250.0000 mg | ORAL_TABLET | Freq: Every day | ORAL | 0 refills | Status: AC

## 2020-09-16 MED ORDER — PREDNISONE 10 MG PO TABS: ORAL_TABLET | ORAL | 0 refills | Status: DC

## 2020-09-16 NOTE — Progress Notes (Signed)
Patient verbalizes understanding of discharge instructions. Opportunity for questioning and answers were provided. Pt d/c from 2W by wheelchair

## 2020-09-16 NOTE — Discharge Instructions (Signed)

## 2020-09-16 NOTE — Discharge Summary (Signed)
Physician Discharge Summary  Tiffany [name] WLN:[id number] DOB: 10-15-1946 DOA: 09/14/2020  PCP: Rita Ohara, MD  Admit date: 09/14/2020 Discharge date: 09/16/2020  Admitted From: Home Discharge disposition: Home   Code Status: Partial Code  Diet Recommendation: Cardiac/diabetic diet  Discharge Diagnosis:   Principal Problem:   COPD exacerbation ([place]) Active Problems:   Pure hypercholesterolemia   Essential hypertension  History of Present Illness / Brief narrative:  Tiffany [name] a 74 y.o.femalewith medical history significant ofCOPD, MGUS, hypertension, hyperlipidemia, atherosclerosis who presented with worsening shortness of breath.  Few days prior to presentation, patient started having upper respiratory tract congestion and sore throat. 1 day prior to presentation, she started having dyspnea which continued overnight and did not respond to bronchodilators.  EMS noted her wheezing with oxygen saturation down to 85%. She was given albuterol, Atrovent, Solu-Medrol, and magnesium and brought to the ED.  In the ED, she was able to maintain oxygen saturation on 2 L per nasal cannula. WBC count elevated to 14,000, Covid PCR and influenza panel negative. Chest x-ray without acute disease.  Subjective:  Seen and examined this morning.  Pleasant elderly Caucasian female.  Sitting up at the edge of the bed.  Not in distress.  Not on supplemental oxygen. Patient states he ambulated on the hallway last night without supplemental oxygen and symptoms.  Hospital Course:  COPD exacerbation Acute respite failure with hypoxia -Started as URI symptoms and progressed to dyspnea  -Chest x-ray without infiltrates.  -She was initiated on azithromycin and IV Solu-Medrol.  -Feels much better this morning.  Initially required supplemental oxygen but currently sees not on supplemental oxygen.  Able to ambulate without supplemental oxygen.  -We will discharge her on 3 more days of  oral azithromycin and a tapering course of prednisone.  -Continue bronchodilators at home.   Hypertension -Continue home losartan  Hyperlipidemia -Continue home simvastatin  Prediabetes -Blood sugar remains controlled. Lab Results  Component Value Date   HGBA1C 5.9 (H) 09/15/2020   Recent Labs  Lab 09/15/20 0922 09/15/20 1155 09/15/20 1616 09/15/20 2119 09/16/20 0934  GLUCAP 150* 165* 169* 139* 155*     Stable for discharge home today.  Wound care:    Discharge Exam:   Vitals:   09/15/20 1925 09/15/20 2100 09/16/20 0523 09/16/20 0716  BP: (!) 123/57  127/60   Pulse: 79  67 64  Resp: 20  14 16   Temp: 98.3 F (36.8 C)  98.6 F (37 C)   TempSrc:      SpO2: 91% 93% 90% 91%    There is no height or weight on file to calculate BMI.  General exam: Appears calm and comfortable.  Not in physical distress Skin: No rashes, lesions or ulcers. HEENT: Atraumatic, normocephalic, supple neck, no obvious bleeding Lungs: Minimal bilateral end-expiratory wheezing.  Much better than yesterday CVS: Regular rate and rhythm, no murmur GI/Abd soft, nontender, nondistended, bowel sound present CNS: Alert, awake, oriented x3 Psychiatry: Mood appropriate Extremities: No pedal edema, no calf tenderness  Follow ups:   Discharge Instructions    Diet - low sodium heart healthy   Complete by: As directed    Increase activity slowly   Complete by: As directed       Follow-up Information    Rita Ohara, MD Follow up.   Specialty: Family Medicine Contact information: [address] [place] [address] [phone]               Recommendations for Outpatient Follow-Up:  1. Follow-up with PCP  Discharge Instructions:  Follow with Primary MD Rita Ohara, MD in 7 days   Get CBC/BMP checked in next visit within 1 week by PCP or SNF MD ( we routinely change or add medications that can affect your baseline labs and fluid status, therefore we recommend that  you get the mentioned basic workup next visit with your PCP, your PCP may decide not to get them or add new tests based on their clinical decision)  On your next visit with your PCP, please Get Medicines reviewed and adjusted.  Please request your PCP  to go over all Hospital Tests and Procedure/Radiological results at the follow up, please get all Hospital records sent to your Prim MD by signing hospital release before you go home.  Activity: As tolerated with Full fall precautions use walker/cane & assistance as needed  For Heart failure patients - Check your Weight same time everyday, if you gain over 2 pounds, or you develop in leg swelling, experience more shortness of breath or chest pain, call your Primary MD immediately. Follow Cardiac Low Salt Diet and 1.5 lit/day fluid restriction.  If you have smoked or chewed Tobacco in the last 2 yrs please stop smoking, stop any regular Alcohol  and or any Recreational drug use.  If you experience worsening of your admission symptoms, develop shortness of breath, life threatening emergency, suicidal or homicidal thoughts you must seek medical attention immediately by calling 911 or calling your MD immediately  if symptoms less severe.  You Must read complete instructions/literature along with all the possible adverse reactions/side effects for all the Medicines you take and that have been prescribed to you. Take any new Medicines after you have completely understood and accpet all the possible adverse reactions/side effects.   Do not drive, operate heavy machinery, perform activities at heights, swimming or participation in water activities or provide baby sitting services if your were admitted for syncope or siezures until you have seen by Primary MD or a Neurologist and advised to do so again.  Do not drive when taking Pain medications.  Do not take more than prescribed Pain, Sleep and Anxiety Medications  Wear Seat belts while  driving.   Please note You were cared for by a hospitalist during your hospital stay. If you have any questions about your discharge medications or the care you received while you were in the hospital after you are discharged, you can call the unit and asked to speak with the hospitalist on call if the hospitalist that took care of you is not available. Once you are discharged, your primary care physician will handle any further medical issues. Please note that NO REFILLS for any discharge medications will be authorized once you are discharged, as it is imperative that you return to your primary care physician (or establish a relationship with a primary care physician if you do not have one) for your aftercare needs so that they can reassess your need for medications and monitor your lab values.    Allergies as of 09/16/2020   No Known Allergies     Medication List    STOP taking these medications   fluticasone 50 MCG/ACT nasal spray Commonly known as: FLONASE     TAKE these medications   albuterol 108 (90 Base) MCG/ACT inhaler Commonly known as: VENTOLIN HFA Inhale 2 puffs into the lungs every 6 (six) hours as needed for wheezing or shortness of breath.   azithromycin 250 MG tablet Commonly known  as: ZITHROMAX Take 1 tablet (250 mg total) by mouth daily for 3 days. What changed:   how much to take  how to take this  when to take this  additional instructions   ergocalciferol 1.25 MG (50000 UT) capsule Commonly known as: VITAMIN D2 Take 1 capsule (50,000 Units total) by mouth once a week.   losartan 50 MG tablet Commonly known as: COZAAR Take 1 tablet (50 mg total) by mouth daily.   predniSONE 10 MG tablet Commonly known as: DELTASONE Take 4 tablets (40 mg) daily for 2 days, then, Take 3 tablets (30 mg) daily for 2 days, then, Take 2 tablets (20 mg) daily for 2 days, then, Take 1 tablets (10 mg) daily for 1 days, then stop.   simvastatin 20 MG tablet Commonly known  as: ZOCOR Take 1 tablet (20 mg total) by mouth daily.   Stiolto Respimat 2.5-2.5 MCG/ACT Aers Generic drug: Tiotropium Bromide-Olodaterol Inhale 2 puffs into the lungs daily.       Time coordinating discharge: 35 minutes  The results of significant diagnostics from this hospitalization (including imaging, microbiology, ancillary and laboratory) are listed below for reference.    Procedures and Diagnostic Studies:   DG Chest Port 1 View  Result Date: 09/14/2020 CLINICAL DATA:  Shortness of breath, wheezing EXAM: PORTABLE CHEST 1 VIEW COMPARISON:  09/10/2018 FINDINGS: There is hyperinflation of the lungs compatible with COPD. Heart and mediastinal contours are within normal limits. No focal opacities or effusions. No acute bony abnormality. IMPRESSION: COPD.  No active disease. Electronically Signed   By: Rolm Baptise M.D.   On: 09/14/2020 20:40     Labs:   Basic Metabolic Panel: Recent Labs  Lab 09/14/20 2215 09/14/20 2215 09/15/20 0326 09/16/20 0054  NA 135  --  133* 134*  K 3.5   < > 3.8 4.0  CL 100  --  100 100  CO2 23  --  22 25  GLUCOSE 204*  --  220* 143*  BUN 9  --  10 17  CREATININE 0.83  --  0.95 0.89  CALCIUM 8.4*  --  8.6* 8.7*  MG  --   --   --  2.2  PHOS  --   --   --  2.3*   < > = values in this interval not displayed.   GFR CrCl cannot be calculated (Unknown ideal weight.). Liver Function Tests: No results for input(s): AST, ALT, ALKPHOS, BILITOT, PROT, ALBUMIN in the last 168 hours. No results for input(s): LIPASE, AMYLASE in the last 168 hours. No results for input(s): AMMONIA in the last 168 hours. Coagulation profile No results for input(s): INR, PROTIME in the last 168 hours.  CBC: Recent Labs  Lab 09/14/20 2215 09/15/20 0326 09/16/20 0054  WBC 14.1* 10.8* 15.1*  NEUTROABS 13.5*  --  13.6*  HGB 14.0 13.9 12.6  HCT 44.0 43.3 38.5  MCV 94.4 93.3 93.4  PLT 188 199 198   Cardiac Enzymes: No results for input(s): CKTOTAL, CKMB,  CKMBINDEX, TROPONINI in the last 168 hours. BNP: Invalid input(s): POCBNP CBG: Recent Labs  Lab 09/15/20 0922 09/15/20 1155 09/15/20 1616 09/15/20 2119 09/16/20 0934  GLUCAP 150* 165* 169* 139* 155*   D-Dimer No results for input(s): DDIMER in the last 72 hours. Hgb A1c Recent Labs    09/15/20 0326  HGBA1C 5.9*   Lipid Profile No results for input(s): CHOL, HDL, LDLCALC, TRIG, CHOLHDL, LDLDIRECT in the last 72 hours. Thyroid function studies No results for  input(s): TSH, T4TOTAL, T3FREE, THYROIDAB in the last 72 hours.  Invalid input(s): FREET3 Anemia work up No results for input(s): VITAMINB12, FOLATE, FERRITIN, TIBC, IRON, RETICCTPCT in the last 72 hours. Microbiology Recent Results (from the past 240 hour(s))  Respiratory Panel by RT PCR (Flu A&B, Covid) - Nasopharyngeal Swab     Status: None   Collection Time: 09/14/20 10:07 PM   Specimen: Nasopharyngeal Swab  Result Value Ref Range Status   SARS Coronavirus 2 by RT PCR NEGATIVE NEGATIVE Final    Comment: (NOTE) SARS-CoV-2 target nucleic acids are NOT DETECTED.  The SARS-CoV-2 RNA is generally detectable in upper respiratoy specimens during the acute phase of infection. The lowest concentration of SARS-CoV-2 viral copies this assay can detect is 131 copies/mL. A negative result does not preclude SARS-Cov-2 infection and should not be used as the sole basis for treatment or other patient management decisions. A negative result may occur with  improper specimen collection/handling, submission of specimen other than nasopharyngeal swab, presence of viral mutation(s) within the areas targeted by this assay, and inadequate number of viral copies (<131 copies/mL). A negative result must be combined with clinical observations, patient history, and epidemiological information. The expected result is Negative.  Fact Sheet for Patients:  PinkCheek.be  Fact Sheet for Healthcare Providers:   GravelBags.it  This test is no t yet approved or cleared by the [place] FDA and  has been authorized for detection and/or diagnosis of SARS-CoV-2 by FDA under an Emergency Use Authorization (EUA). This EUA will remain  in effect (meaning this test can be used) for the duration of the COVID-19 declaration under Section 564(b)(1) of the Act, 21 U.S.C. section 360bbb-3(b)(1), unless the authorization is terminated or revoked sooner.     Influenza A by PCR NEGATIVE NEGATIVE Final   Influenza B by PCR NEGATIVE NEGATIVE Final    Comment: (NOTE) The Xpert Xpress SARS-CoV-2/FLU/RSV assay is intended as an aid in  the diagnosis of influenza from Nasopharyngeal swab specimens and  should not be used as a sole basis for treatment. Nasal washings and  aspirates are unacceptable for Xpert Xpress SARS-CoV-2/FLU/RSV  testing.  Fact Sheet for Patients: PinkCheek.be  Fact Sheet for Healthcare Providers: GravelBags.it  This test is not yet approved or cleared by the [place] FDA and  has been authorized for detection and/or diagnosis of SARS-CoV-2 by  FDA under an Emergency Use Authorization (EUA). This EUA will remain  in effect (meaning this test can be used) for the duration of the  Covid-19 declaration under Section 564(b)(1) of the Act, 21  U.S.C. section 360bbb-3(b)(1), unless the authorization is  terminated or revoked. Performed at [facility], [place] [address] [place], [place] [address]      Signed: Terrilee Croak  [facility] 09/16/2020, 11:43 AM

## 2020-09-16 NOTE — Plan of Care (Signed)

## 2020-09-19 ENCOUNTER — Other Ambulatory Visit: Payer: Self-pay | Admitting: *Deleted

## 2020-09-19 ENCOUNTER — Telehealth: Payer: Self-pay

## 2020-09-19 NOTE — Telephone Encounter (Signed)
Called patient and lmom to call and schedule hospital follow up.

## 2020-09-20 ENCOUNTER — Other Ambulatory Visit: Payer: Self-pay | Admitting: *Deleted

## 2020-09-20 ENCOUNTER — Encounter: Payer: Self-pay | Admitting: *Deleted

## 2020-09-20 ENCOUNTER — Ambulatory Visit: Payer: Self-pay | Admitting: *Deleted

## 2020-09-20 NOTE — Patient Instructions (Signed)
Goals Addressed              This Visit's Progress     COMPLETED: Client will verbalize knowledge of chronic lung disease as evidenced by no ED visits or Inpatient stays related to chronic lung disease         CARE PLAN ENTRY (see longitudinal plan of care for additional care plan information)  Current Barriers:   Knowledge deficits related to basic understanding of COPD disease process  Knowledge deficits related to basic COPD self care/management  Knowledge deficit related to importance of energy conservation  Cannot afford prescribed medications   Case Manager Clinical Goal(s):  Over the next 90 days patient will report using inhalers as prescribed including rinsing mouth after use  Over the next 90 days patient will report utilizing pursed lip breathing for shortness of breath  Over the next 90 days, patient will be able to verbalize understanding of COPD action plan and when to seek appropriate levels of medical care  Over the next 90 days, patient will engage in lite exercise as tolerated to build/regain stamina and strength and reduce shortness of breath through activity tolerance  Over the next 90 days, patient will verbalize basic understanding of COPD disease process and self care activities  Over the next 90 days, patient will not be hospitalized for COPD exacerbation   Interventions:   Provided patient with basic written and verbal COPD education on self care/management/and exacerbation prevention   Provided patient with COPD action plan and reinforced importance of daily self assessment  Provided instruction about proper use of medications used for management of COPD including inhalers  Advised patient to self assesses COPD action plan zone and make appointment with provider if in the yellow zone for 48 hours without improvement.  Provided patient with education about the role of exercise in the management of COPD  Advised patient to engage in light  exercise as tolerated 3-5 days a week  Patient Self Care Activities:   Takes medications as prescribed including inhalers  Practices and uses pursed lip breathing for shortness of breath recovery and prevention  Self assesses COPD action plan zone and makes appointment with provider if in the yellow zone for 48 hours without improvement.  Engages in light exercise 3-5 days a week  Utilizes infection prevention strategies to reduce risk of respiratory infection   Behavior modifications  Initial goal documentation Resolving due to incomplete goal, transition to complex care management due to admission       Learn and Do Breathing Exercises        Follow Up Date 09/20/20   - do breathing exercises every day - do exercises in a comfortable position that makes breathing as easy as possible    Why is this important?   Breathing exercises can help lessen the cough that comes with chronic obstructive pulmonary disease.  Doing the exercises will give you more energy.  They will also help you to control your symptoms.    Notes:       Patient will not experience a hospital readmission over the next 31 days (pt-stated)            Takes medications as prescribed including inhalers  Practices and uses pursed lip breathing for shortness of breath recovery and prevention  Self assesses COPD action plan zone and makes appointment with provider if in the yellow zone for 48 hours without improvement.  Reinforced review of COPD education on breathing exercises  [facility] Make and Keep All Appointments        Follow Up Date 09/30/20   - keep a calendar with appointment dates  Will schedule and keep all appointments     Why is this important?   Part of staying healthy is seeing the doctor for follow-up care.  If you forget your appointments, there are some things you can do to stay on track.    Notes:        Track and Manage My Symptoms        Follow Up Date 09/27/20   - follow  rescue plan if symptoms flare-up - keep follow-up appointments    Why is this important?   Tracking your symptoms and other information about your health helps your doctor plan your care.  Write down the symptoms, the time of day, what you were doing and what medicine you are taking.  You will soon learn how to manage your symptoms.     Notes:       Track and Manage My Triggers        Follow Up Date 09/30/20   - limit outdoor activity during cold weather    Why is this important?   Triggers are activities or things, like tobacco smoke or cold weather, that make your COPD (chronic obstructive pulmonary disease) flare-up.  Knowing these triggers helps you plan how to stay away from them.  When you cannot remove them, you can learn how to manage them.     Notes:

## 2020-09-20 NOTE — Patient Outreach (Addendum)
[facility]) Care Management  [place]  09/20/2020   Leonna Schlee Shenker 1946-07-06 [id number]    Telephone Assessment   Referral received : 09/19/20 Referral source : [name] , [place]  Date of Admission : 09/14/20 Diagnosis : COPD exacerbation  Date of Discharge: 09/16/20 Facility: [place]: [employer] cross blue shield   Subjective:  Successful outreach call to patient, introduced self  explained reason for the call, HIPAA identity confirmed x 2. Discussed with patient follow up call after recent hospital admission, discussed patient being followed by  [name] , [place] prior to admission and will plan transition back to health coach at end of complex care management program , patient is agreeable.  Patient reports that she is doing okay, stating this was her first hospital admission in  2 years.  She reports still having a cough with producing green/yellow  sputum, she denies having fever, and using rescue inhaler one to two times a day. She discussed continuing on tapering prednisone dose after discharge. She reports tolerating usual activity in home, did a little housekeeping on yesterday resting in between as she got a winded.  Patient reports that she has not scheduled after discharge visit with PCP but she will do it.    Encounter Medications:  Outpatient Encounter Medications as of 09/20/2020  Medication Sig Note  . albuterol (VENTOLIN HFA) 108 (90 Base) MCG/ACT inhaler Inhale 2 puffs into the lungs every 6 (six) hours as needed for wheezing or shortness of breath. 08/08/2020: Using once daily if needed  . ergocalciferol (VITAMIN D2) 1.25 MG (50000 UT) capsule Take 1 capsule (50,000 Units total) by mouth once a week. 09/14/2020: Fridays  . losartan (COZAAR) 50 MG tablet Take 1 tablet (50 mg total) by mouth daily.   . predniSONE (DELTASONE) 10 MG tablet Take 4 tablets (40 mg) daily for 2 days, then, Take  3 tablets (30 mg) daily for 2 days, then, Take 2 tablets (20 mg) daily for 2 days, then, Take 1 tablets (10 mg) daily for 1 days, then stop.   . simvastatin (ZOCOR) 20 MG tablet Take 1 tablet (20 mg total) by mouth daily.   . Tiotropium Bromide-Olodaterol (STIOLTO RESPIMAT) 2.5-2.5 MCG/ACT AERS Inhale 2 puffs into the lungs daily.    No facility-administered encounter medications on file as of 09/20/2020.    Functional Status:  In your present state of health, do you have any difficulty performing the following activities: 09/20/2020 09/15/2020  Hearing? N N  Vision? N N  Difficulty concentrating or making decisions? N N  Comment - -  Walking or climbing stairs? N N  Comment gets winded walkiing steps -  Dressing or bathing? N N  Doing errands, shopping? N Y  Conservation officer, nature and eating ? N -  Using the Toilet? N -  In the past six months, have you accidently leaked urine? N -  Do you have problems with loss of bowel control? N -  Managing your Medications? N -  Managing your Finances? N -  Housekeeping or managing your Housekeeping? N -  Some recent data might be hidden    Fall/Depression Screening: Fall Risk  09/20/2020 08/18/2020 08/08/2020  Falls in the past year? 0 0 0  Number falls in past yr: 0 1 -  Injury with Fall? 0 0 -  Risk for fall due to : No Fall Risks History of fall(s) -  Follow up - Falls evaluation  completed -   PHQ 2/9 Scores 09/20/2020 08/08/2020 02/04/2020 10/16/2019 09/17/2019 12/03/2018 06/04/2018  PHQ - 2 Score 0 0 0 0 0 0 0     Food Insecurity: No Food Insecurity  . Worried About Charity fundraiser in the Last Year: Never true  . Ran Out of Food in the Last Year: Never true     Transportation Needs: No Transportation Needs  . Lack of Transportation (Medical): No  . Lack of Transportation (Non-Medical): No   Assessment:  Goals Addressed              This Visit's Progress   .  COMPLETED: Client will verbalize knowledge of chronic lung disease as  evidenced by no ED visits or Inpatient stays related to chronic lung disease         CARE PLAN ENTRY (see longitudinal plan of care for additional care plan information)  Current Barriers:  Marland Kitchen Knowledge deficits related to basic understanding of COPD disease process . Knowledge deficits related to basic COPD self care/management . Knowledge deficit related to importance of energy conservation . Cannot afford prescribed medications   Case Manager Clinical Goal(s):  Over the next 90 days patient will report using inhalers as prescribed including rinsing mouth after use  Over the next 90 days patient will report utilizing pursed lip breathing for shortness of breath  Over the next 90 days, patient will be able to verbalize understanding of COPD action plan and when to seek appropriate levels of medical care  Over the next 90 days, patient will engage in lite exercise as tolerated to build/regain stamina and strength and reduce shortness of breath through activity tolerance  Over the next 90 days, patient will verbalize basic understanding of COPD disease process and self care activities  Over the next 90 days, patient will not be hospitalized for COPD exacerbation   Interventions:   Provided patient with basic written and verbal COPD education on self care/management/and exacerbation prevention   Provided patient with COPD action plan and reinforced importance of daily self assessment  Provided instruction about proper use of medications used for management of COPD including inhalers  Advised patient to self assesses COPD action plan zone and make appointment with provider if in the yellow zone for 48 hours without improvement.  Provided patient with education about the role of exercise in the management of COPD  Advised patient to engage in light exercise as tolerated 3-5 days a week  Patient Self Care Activities:  . Takes medications as prescribed including inhalers . Practices  and uses pursed lip breathing for shortness of breath recovery and prevention . Self assesses COPD action plan zone and makes appointment with provider if in the yellow zone for 48 hours without improvement. . Engages in light exercise 3-5 days a week . Utilizes infection prevention strategies to reduce risk of respiratory infection  . Behavior modifications  Initial goal documentation Resolving due to incomplete goal, transition to complex care management due to admission     .  Learn and Do Breathing Exercises        Follow Up Date 09/20/20   - do breathing exercises every day - do exercises in a comfortable position that makes breathing as easy as possible    Why is this important?   Breathing exercises can help lessen the cough that comes with chronic obstructive pulmonary disease.  Doing the exercises will give you more energy.  They will also help you to control your symptoms.  Notes:     Marland Kitchen  Manage Fatigue (Tiredness)        Follow Up Date 10/25/20   - eat healthy - limit daytime naps Balance activity during the day with rest periods     Why is this important?   Feeling tired or worn out is a common symptom of COPD (chronic obstructive pulmonary disease).  Learning when you feel your best and when you need rest is important.  Managing the tiredness (fatigue) will help you be active and enjoy life.     Notes:     .  Patient will not experience a hospital readmission over the next 31 days (pt-stated)           . Takes medications as prescribed including inhalers . Practices and uses pursed lip breathing for shortness of breath recovery and prevention . Self assesses COPD action plan zone and makes appointment with provider if in the yellow zone for 48 hours without improvement. . Reinforced review of COPD education on breathing exercises    .  [facility] Make and Keep All Appointments        Follow Up Date 09/30/20   - keep a calendar with appointment dates  Will  schedule and keep all appointments     Why is this important?   Part of staying healthy is seeing the doctor for follow-up care.  If you forget your appointments, there are some things you can do to stay on track.    Notes:      .  Track and Manage My Symptoms        Follow Up Date 09/27/20   - follow rescue plan if symptoms flare-up - keep follow-up appointments     Why is this important?   Tracking your symptoms and other information about your health helps your doctor plan your care.  Write down the symptoms, the time of day, what you were doing and what medicine you are taking.  You will soon learn how to manage your symptoms.     Notes:     .  Track and Manage My Triggers        Follow Up Date 09/30/20   - limit outdoor activity during cold weather    Why is this important?   Triggers are activities or things, like tobacco smoke or cold weather, that make your COPD (chronic obstructive pulmonary disease) flare-up.  Knowing these triggers helps you plan how to stay away from them.  When you cannot remove them, you can learn how to manage them.     Notes:      COPD Patient with continued cough denies worsening symptom of COPD, productive with yellow/green sputum. Tolerating activity in home. Reinforced  worsening symptoms of notify MD of  . Continuing tapering prednisone schedule and inhalers as prescribed.  Reinforced scheduling post discharge visit, declined assistance, agrees to schedule appointment.  Will benefit from ongoing complex care management follow post discharge.    Plan:  Will send PCP barrier involvement letter  Will send patient welcome letter  Will send patient EMMI handout on Breathing exercises , COPD exacerbation.  Will plan follow up call in the next 2 weeks   Joylene Draft, RN, BSN  [place] Management Coordinator  [phone]- Mobile [phone]- [place]

## 2020-09-21 ENCOUNTER — Telehealth: Payer: Self-pay

## 2020-09-21 NOTE — Telephone Encounter (Signed)
I called pt. Because she was recently in the hospital for COPD and her medications were gone over and reconciled. She has been scheduled for her hospital f/u on 09/26/20.

## 2020-09-21 NOTE — Progress Notes (Signed)
Pt. Called and went over medications and has been scheduled for a hospital f/u on 09/26/20.

## 2020-09-22 ENCOUNTER — Telehealth: Payer: Self-pay | Admitting: Family Medicine

## 2020-09-22 NOTE — Telephone Encounter (Signed)
[facility] Med Adherence report shows concern for correct usage of Losartan.  Last fill 06/14/20 90 days. Upcoming appt in November

## 2020-09-25 NOTE — Progress Notes (Signed)
Chief Complaint  Patient presents with  . Hospitalization Follow-up    follow up on COPD. Still taking rx vitamin d weekly. No new concerns.    Patient presents for hospital follow-up. She was hospitalized 10/20-10/22/2021 with COPD exacerbation. She had developed URI symptoms (sore throat, congestion), which progressed quickly to dyspnea which didn't respond to her inhalers.  She initially required oxygen in the hospital.  CXR didn't show acute disease, COVID PCR and influenza panel were negative. She was treated with azithromycin, steroids, and improved quickly, no longer requiring oxygen.  She was sent home on a prednisone taper and to complete course of azithromycin.  Home meds weren't changed. She continues on Stiolto and albuterol prn, per pulmonary, whom she last saw in 05/2020.  Needing albuterol once daily since discharge, similar to her baseline. Steam from shower makes her short of breath, as does walking up her driveway or moving around while carrying things.  Cough is baseline, gets some nasal drainage, which is much better than it had been. Color is now clear (had been green).  Vitamin D deficiency:  In September 2021 her vitamin D was low at 23.3, when she hadn't been taking her vitamin regularly. She was prescribed 12 weeks of once weekly 50,000 IU, and had been advised to restart taking over-the-counter Vitamin D3 5000 IU once daily upon completion of the prescription. She still hasn't completed the rx yet. UPDATE  PMH, PSH, SH reviewed  Outpatient Encounter Medications as of 09/26/2020  Medication Sig Note  . albuterol (VENTOLIN HFA) 108 (90 Base) MCG/ACT inhaler Inhale 2 puffs into the lungs every 6 (six) hours as needed for wheezing or shortness of breath. 09/26/2020: Using once daily since discharge, needed it twice today  . ergocalciferol (VITAMIN D2) 1.25 MG (50000 UT) capsule Take 1 capsule (50,000 Units total) by mouth once a week. 09/14/2020: Fridays  . losartan (COZAAR)  50 MG tablet Take 1 tablet (50 mg total) by mouth daily.   . simvastatin (ZOCOR) 20 MG tablet Take 1 tablet (20 mg total) by mouth daily.   . Tiotropium Bromide-Olodaterol (STIOLTO RESPIMAT) 2.5-2.5 MCG/ACT AERS Inhale 2 puffs into the lungs daily.   . [DISCONTINUED] predniSONE (DELTASONE) 10 MG tablet Take 4 tablets (40 mg) daily for 2 days, then, Take 3 tablets (30 mg) daily for 2 days, then, Take 2 tablets (20 mg) daily for 2 days, then, Take 1 tablets (10 mg) daily for 1 days, then stop. (Patient not taking: Reported on 09/26/2020)    No facility-administered encounter medications on file as of 09/26/2020.   No Known Allergies  ROS: no fever, chills, headaches, dizziness, chest pain, palpitations, URI symptoms (improving).  Cough/breathing at baseline.  No edema.  No bleeding, bruising, rash. No nausea, vomiting, diarrhea other other GI complaints. No GU complaints. Moods are good.   PHYSICAL EXAM:  BP 122/76   Pulse 76   Ht 5\' 7"  (1.702 m)   Wt 159 lb 3.2 oz (72.2 kg)   SpO2 98%   BMI 24.93 kg/m   Wt Readings from Last 3 Encounters:  09/26/20 159 lb 3.2 oz (72.2 kg)  08/08/20 160 lb 12.8 oz (72.9 kg)  06/14/20 160 lb 3.2 oz (72.7 kg)   HEENT: conjunctiva and sclera are clear.EOMI.TM's and EACs are normal. Nasal mucosa is normal, no drainage. OP is clear Neck: no lymphadenopathy, thyromegalyor mass, no bruit. Heart: regular rate and rhythm, no murmur Lungs: somewhat distant, especially in upper lung fields. No wheezes, rales, ronchi Back:no spinal  or CVA tenderness Abdomen: soft, nontender, no organomegaly or mass Extremities: no edema, normal pulses Skin: normal turgor, no rash Psych: normal mood, affect, hygiene and grooming Neuro: alert and oriented, normal gait    ASSESSMENT/PLAN:  Chronic respiratory failure with hypoxia ([place]) - Back to baseline COPD, no longer requiring oxygen. Cont Stiolto.  to f/u with pulm in January, sooner if worsening  dyspnea/cough  Chronic obstructive pulmonary disease with acute exacerbation ([place]) - improving, s/p course of azithromycin and prednisone. - Plan: CBC with Differential/Platelet  Essential hypertension - well controlled  Vitamin D deficiency - complete rx, then start 5000 IU daily D3  Medication monitoring encounter - Plan: CBC with Differential/Platelet, Basic metabolic panel   Due to f/u with pulm Dr. Loanne Drilling in 11/2020.  Doesn't have an appointment scheduled, reminded to schedule.  F/u here as scheduled in April, sooner prn.

## 2020-09-26 ENCOUNTER — Ambulatory Visit (INDEPENDENT_AMBULATORY_CARE_PROVIDER_SITE_OTHER): Payer: Medicare Other | Admitting: Family Medicine

## 2020-09-26 ENCOUNTER — Other Ambulatory Visit: Payer: Self-pay

## 2020-09-26 ENCOUNTER — Encounter: Payer: Self-pay | Admitting: Family Medicine

## 2020-09-26 VITALS — BP 122/76 | HR 76 | Ht 67.0 in | Wt 159.2 lb

## 2020-09-26 DIAGNOSIS — Z5181 Encounter for therapeutic drug level monitoring: Secondary | ICD-10-CM

## 2020-09-26 DIAGNOSIS — J9611 Chronic respiratory failure with hypoxia: Secondary | ICD-10-CM

## 2020-09-26 DIAGNOSIS — J441 Chronic obstructive pulmonary disease with (acute) exacerbation: Secondary | ICD-10-CM

## 2020-09-26 DIAGNOSIS — I10 Essential (primary) hypertension: Secondary | ICD-10-CM | POA: Diagnosis not present

## 2020-09-26 DIAGNOSIS — E559 Vitamin D deficiency, unspecified: Secondary | ICD-10-CM

## 2020-09-26 NOTE — Patient Instructions (Addendum)
Please contact pulmonary office to schedule your follow-up for January (with Dr. Loanne Drilling or another provider).  Once you finish the prescription vitamin D, please start taking a daily, over-the-counter D3.  I'd like the dose to be 5000 IU daily, and continue that dose long-term (if your current bottle isn't 5000 IU, get that strength for your next bottle, and take your current vitamins to equal 5000 IU daily).  At any onset of congestion (nasal) or sore throat, you should start up the Mucinex, and also consider trying sinus rinses (Sinus Rinse Kit or Neti-Pot), which might clear some of the drainage before it goes down your throat to your chest and affects your breathing. If the sore throat/runny nose starts related to allergies (ie with season changes), then you can also start an allergy medication (such as claritin).

## 2020-09-27 LAB — CBC WITH DIFFERENTIAL/PLATELET
Basophils Absolute: 0.1 10*3/uL (ref 0.0–0.2)
Basos: 1 %
EOS (ABSOLUTE): 0.2 10*3/uL (ref 0.0–0.4)
Eos: 2 %
Hematocrit: 45.9 % (ref 34.0–46.6)
Hemoglobin: 15.9 g/dL (ref 11.1–15.9)
Immature Grans (Abs): 0 10*3/uL (ref 0.0–0.1)
Immature Granulocytes: 0 %
Lymphocytes Absolute: 1.5 10*3/uL (ref 0.7–3.1)
Lymphs: 14 %
MCH: 32.3 pg (ref 26.6–33.0)
MCHC: 34.6 g/dL (ref 31.5–35.7)
MCV: 93 fL (ref 79–97)
Monocytes Absolute: 0.8 10*3/uL (ref 0.1–0.9)
Monocytes: 8 %
Neutrophils Absolute: 7.8 10*3/uL — ABNORMAL HIGH (ref 1.4–7.0)
Neutrophils: 75 %
Platelets: 215 10*3/uL (ref 150–450)
RBC: 4.92 x10E6/uL (ref 3.77–5.28)
RDW: 12.5 % (ref 11.7–15.4)
WBC: 10.3 10*3/uL (ref 3.4–10.8)

## 2020-09-27 LAB — BASIC METABOLIC PANEL
BUN/Creatinine Ratio: 13 (ref 12–28)
BUN: 14 mg/dL (ref 8–27)
CO2: 21 mmol/L (ref 20–29)
Calcium: 9.6 mg/dL (ref 8.7–10.3)
Chloride: 97 mmol/L (ref 96–106)
Creatinine, Ser: 1.06 mg/dL — ABNORMAL HIGH (ref 0.57–1.00)
GFR calc Af Amer: 60 mL/min/{1.73_m2} (ref >59)
GFR calc non Af Amer: 52 mL/min/{1.73_m2} — ABNORMAL LOW (ref >59)
Glucose: 97 mg/dL (ref 65–99)
Potassium: 4.5 mmol/L (ref 3.5–5.2)
Sodium: 135 mmol/L (ref 134–144)

## 2020-10-04 ENCOUNTER — Other Ambulatory Visit: Payer: Self-pay | Admitting: *Deleted

## 2020-10-04 NOTE — Patient Outreach (Signed)
[facility]) Care Management  [place]  10/04/2020   Tiffany [name] 10/10/1946 [id number]   Referral received : 09/19/20 Referral source : [name] , [place]  Date of Admission : 09/14/20 Diagnosis : COPD exacerbation  Date of Discharge: 09/16/20 Facility: [place]: [employer] cross blue shield    Subjective:  Successful outreach call to patient reports that she is doing good. She denies increase in cough, no change in sputum color. She discussed being back to her normal since hospitalization. Patient is speaking in complete sentences, occasional cough noted during conversation. She reports using albuterol rescue inhaler about daily, and depending on activity she is doing.  Patient discussed recent visit with PCP and plans to follow up with Pulmonary to call in January for appointment.     Encounter Medications:  Outpatient Encounter Medications as of 10/04/2020  Medication Sig Note  . albuterol (VENTOLIN HFA) 108 (90 Base) MCG/ACT inhaler Inhale 2 puffs into the lungs every 6 (six) hours as needed for wheezing or shortness of breath. 09/26/2020: Using once daily since discharge, needed it twice today  . ergocalciferol (VITAMIN D2) 1.25 MG (50000 UT) capsule Take 1 capsule (50,000 Units total) by mouth once a week. 09/14/2020: Fridays  . losartan (COZAAR) 50 MG tablet Take 1 tablet (50 mg total) by mouth daily.   . simvastatin (ZOCOR) 20 MG tablet Take 1 tablet (20 mg total) by mouth daily.   . Tiotropium Bromide-Olodaterol (STIOLTO RESPIMAT) 2.5-2.5 MCG/ACT AERS Inhale 2 puffs into the lungs daily.    No facility-administered encounter medications on file as of 10/04/2020.    Functional Status:  In your present state of health, do you have any difficulty performing the following activities: 09/20/2020 09/15/2020  Hearing? N N  Vision? N N  Difficulty concentrating or making decisions? N N  Comment - -  Walking or climbing  stairs? N N  Comment gets winded walkiing steps -  Dressing or bathing? N N  Doing errands, shopping? N Y  Conservation officer, nature and eating ? N -  Using the Toilet? N -  In the past six months, have you accidently leaked urine? N -  Do you have problems with loss of bowel control? N -  Managing your Medications? N -  Managing your Finances? N -  Housekeeping or managing your Housekeeping? N -  Some recent data might be hidden    Fall/Depression Screening: Fall Risk  09/20/2020 08/18/2020 08/08/2020  Falls in the past year? 0 0 0  Number falls in past yr: 0 1 -  Injury with Fall? 0 0 -  Risk for fall due to : No Fall Risks History of fall(s) -  Follow up - Falls evaluation completed -   PHQ 2/9 Scores 09/20/2020 08/08/2020 02/04/2020 10/16/2019 09/17/2019 12/03/2018 06/04/2018  PHQ - 2 Score 0 0 0 0 0 0 0    Assessment:  Goals Addressed              This Visit's Progress   .  Learn and Do Breathing Exercises   On track     Follow Up Date 09/20/20   - do breathing exercises every day - do exercises in a comfortable position that makes breathing as easy as possible    Why is this important?   Breathing exercises can help lessen the cough that comes with chronic obstructive pulmonary disease.  Doing the exercises will give you more energy.  They will also  help you to control your symptoms.    Notes:     Marland Kitchen  Manage Fatigue (Tiredness)   On track     Follow Up Date 10/25/20   - eat healthy - limit daytime naps Balance activity during the day with rest periods     Why is this important?   Feeling tired or worn out is a common symptom of COPD (chronic obstructive pulmonary disease).  Learning when you feel your best and when you need rest is important.  Managing the tiredness (fatigue) will help you be active and enjoy life.     Notes:     .  Patient will not experience a hospital readmission over the next 31 days (pt-stated)   On track        . Takes medications as  prescribed including inhalers . Practices and uses pursed lip breathing for shortness of breath recovery and prevention . Self assesses COPD action plan zone and makes appointment with provider if in the yellow zone for 48 hours without improvement. . Reinforced review of COPD education on breathing exercises    .  COMPLETED: [facility] Make and Keep All Appointments   On track     Follow Up Date 09/30/20   - keep a calendar with appointment dates  Will schedule and keep all appointments     Why is this important?   Part of staying healthy is seeing the doctor for follow-up care.  If you forget your appointments, there are some things you can do to stay on track.    Notes:      .  Track and Manage My Symptoms   On track     Follow Up Date 09/27/20   - follow rescue plan if symptoms flare-up - keep follow-up appointments     Why is this important?   Tracking your symptoms and other information about your health helps your doctor plan your care.  Write down the symptoms, the time of day, what you were doing and what medicine you are taking.  You will soon learn how to manage your symptoms.     Notes:     .  Track and Manage My Triggers   On track     Follow Up Date 09/30/20   - limit outdoor activity during cold weather    Why is this important?   Triggers are activities or things, like tobacco smoke or cold weather, that make your COPD (chronic obstructive pulmonary disease) flare-up.  Knowing these triggers helps you plan how to stay away from them.  When you cannot remove them, you can learn how to manage them.     Notes:       Assessment  COPD- No worsening symptoms, using rescue inhaler as needed without reports of increase need. Tolerating usual activity.    Plan:  Will plan return call in the next 3 weeks and transition back to health coach if no identified care coordination needs.    Joylene Draft, RN, BSN  [place] Management Coordinator    [phone]- Mobile [phone]- Toll Free Main Office

## 2020-10-17 ENCOUNTER — Other Ambulatory Visit: Payer: Self-pay | Admitting: Pulmonary Disease

## 2020-10-17 DIAGNOSIS — J449 Chronic obstructive pulmonary disease, unspecified: Secondary | ICD-10-CM

## 2020-10-24 ENCOUNTER — Encounter: Payer: Self-pay | Admitting: Family Medicine

## 2020-10-24 ENCOUNTER — Ambulatory Visit (INDEPENDENT_AMBULATORY_CARE_PROVIDER_SITE_OTHER): Payer: Medicare Other | Admitting: Family Medicine

## 2020-10-24 ENCOUNTER — Other Ambulatory Visit: Payer: Self-pay

## 2020-10-24 VITALS — BP 150/80 | HR 72 | Ht 67.0 in | Wt 163.0 lb

## 2020-10-24 DIAGNOSIS — M25552 Pain in left hip: Secondary | ICD-10-CM | POA: Diagnosis not present

## 2020-10-24 MED ORDER — MELOXICAM 15 MG PO TABS: 15.0000 mg | ORAL_TABLET | Freq: Every day | ORAL | 0 refills | Status: DC

## 2020-10-24 NOTE — Progress Notes (Signed)
Chief Complaint  Patient presents with  . Hip Pain    left hip pain that started about a week ago. Worst pain was Friday. Foot was starting to get swollen. Her friend had a muscle relaxer and she thinks it was flexeril -said 10mg  and it didn't help. Took aleve and it didn't help either. Also has used heating pad. [place] when sitting, painful when moving around.    Patient presents with complaint of left lateral hip pain.  Has been off and on, mild, but after standing a lot on Thursday (cooking), she noticed pain was much worse on Friday. Other than being on her feet cooking, she denies any change in activity; no fall or trauma.  L foot looked swollen Friday night, better the next day. She took a muscle relaxant (flexeril) from a friend, didn't help.  This morning pain wasn't bad, but after her shower, pain has been worse. Denies back pain.  Heat didn't help, nor did biofreeze. Took Aleve, 2 pills just once, didn't think it helped much.  PMH, PSH, SH reviewed  Outpatient Encounter Medications as of 10/24/2020  Medication Sig Note  . ergocalciferol (VITAMIN D2) 1.25 MG (50000 UT) capsule Take 1 capsule (50,000 Units total) by mouth once a week. 09/14/2020: Fridays  . losartan (COZAAR) 50 MG tablet Take 1 tablet (50 mg total) by mouth daily.   Marland Kitchen PROVENTIL HFA 108 (90 Base) MCG/ACT inhaler INHALE TWO PUFFS BY MOUTH EVERY 4 HOURS   . simvastatin (ZOCOR) 20 MG tablet Take 1 tablet (20 mg total) by mouth daily.   . Tiotropium Bromide-Olodaterol (STIOLTO RESPIMAT) 2.5-2.5 MCG/ACT AERS Inhale 2 puffs into the lungs daily.   . meloxicam (MOBIC) 15 MG tablet Take 1 tablet (15 mg total) by mouth daily.    No facility-administered encounter medications on file as of 10/24/2020.   NOT taking meloxicam prior to today's visit  No Known Allergies  ROS: no fever, chills, numbness, tingling, weakness, back pain, urinary complaints, URI symptoms. Breathing is at baseline. Edema in L foot x 1d, resolved. No  bleeding/bruising. See HPI  PHYSICAL EXAM:  BP (!) 150/80   Pulse 72   Ht 5\' 7"  (1.702 m)   Wt 163 lb (73.9 kg)   BMI 25.53 kg/m   Pleasant, elderly female, accompanied by her husband.  Appears to be in mild-moderate discomfort with position changes. HEENT conjunctiva and sclera are clear, EOMI, wearing mask Heart: regular rate and rhythm Lungs: clear Back: nontender at spine, CVA, and SI joints. Some tenderness over L TFL, extending inferiorly. Some discomfort with IT band stretch, mainly proximally (not at knee). Some pain with figure 4 stretch, into the lateral hip (not buttock) She has FROM of hips without pain. nontender at trochanteric bursa, and at groin Normal strength in LE's, normal sensation. DTR's are 3+ and symmetric Normal gait Psych: normal mood, affect, hygiene and grooming   ASSESSMENT/PLAN:  Left hip pain - Ddx reviewed--is lateral, suspect strain of TFL/ITband. Stretches, heat, NSAID. Consider PT if not better, consider XR. Not hip joint - Plan: meloxicam (MOBIC) 15 MG tablet  meloxicam 15mg . Risks/SE/precautions reviewed   Take the meloxicam once daily with food. Do not take any aleve, advil/ibuprofen/goody or BC powder while taking this. You CAN take tylenol if needed for pain. Re-try heat to the left hip, along with stretches as shown.

## 2020-10-24 NOTE — Patient Instructions (Signed)
Take the meloxicam once daily with food. Do not take any aleve, advil/ibuprofen/goody or BC powder while taking this. You CAN take tylenol if needed for pain. Re-try heat to the left hip, along with stretches as shown.   Hip Pain The hip is the joint between the upper legs and the lower pelvis. The bones, cartilage, tendons, and muscles of your hip joint support your body and allow you to move around. Hip pain can range from a minor ache to severe pain in one or both of your hips. The pain may be felt on the inside of the hip joint near the groin, or on the outside near the buttocks and upper thigh. You may also have swelling or stiffness in your hip area. Follow these instructions at home: Managing pain, stiffness, and swelling      If directed, put ice on the painful area. To do this: ? Put ice in a plastic bag. ? Place a towel between your skin and the bag. ? Leave the ice on for 20 minutes, 2-3 times a day.  If directed, apply heat to the affected area as often as told by your health care provider. Use the heat source that your health care provider recommends, such as a moist heat pack or a heating pad. ? Place a towel between your skin and the heat source. ? Leave the heat on for 20-30 minutes. ? Remove the heat if your skin turns bright red. This is especially important if you are unable to feel pain, heat, or cold. You may have a greater risk of getting burned. Activity  Do exercises as told by your health care provider.  Avoid activities that cause pain. General instructions   Take over-the-counter and prescription medicines only as told by your health care provider.  Keep a journal of your symptoms. Write down: ? How often you have hip pain. ? The location of your pain. ? What the pain feels like. ? What makes the pain worse.  Sleep with a pillow between your legs on your most comfortable side.  Keep all follow-up visits as told by your health care provider. This is  important. Contact a health care provider if:  You cannot put weight on your leg.  Your pain or swelling continues or gets worse after one week.  It gets harder to walk.  You have a fever. Get help right away if:  You fall.  You have a sudden increase in pain and swelling in your hip.  Your hip is red or swollen or very tender to touch. Summary  Hip pain can range from a minor ache to severe pain in one or both of your hips.  The pain may be felt on the inside of the hip joint near the groin, or on the outside near the buttocks and upper thigh.  Avoid activities that cause pain.  Write down how often you have hip pain, the location of the pain, what makes it worse, and what it feels like. This information is not intended to replace advice given to you by your health care provider. Make sure you discuss any questions you have with your health care provider. Document Revised: 03/30/2019 Document Reviewed: 03/30/2019 Elsevier Patient Education  [place].

## 2020-10-25 ENCOUNTER — Other Ambulatory Visit: Payer: Self-pay | Admitting: *Deleted

## 2020-10-25 NOTE — Patient Outreach (Signed)
[facility]) Care Management  [place]  10/25/2020   Tiffany [name] 1946-02-01 [id number]   Referral received : 09/19/20 Referral source : [name] , [place]  Date of Admission : 09/14/20 Diagnosis : COPD exacerbation  Date of Discharge: 09/16/20 Facility: [place]: [facility]    Subjective:  Successful outreach call to patient she reports doing okay but being down in her back, She discussed visit with her doctor on yesterday. She reports being prescribed medication she reports taking one dose so far with some relief, she is taking it easy and will follow up with MD if not better in a week.  She discussed as far as her breathing it is doing fine , she denies worsening cough, shortness of breath or change in sputum color.   Encounter Medications:  Outpatient Encounter Medications as of 10/25/2020  Medication Sig Note  . ergocalciferol (VITAMIN D2) 1.25 MG (50000 UT) capsule Take 1 capsule (50,000 Units total) by mouth once a week. 09/14/2020: Fridays  . losartan (COZAAR) 50 MG tablet Take 1 tablet (50 mg total) by mouth daily.   . meloxicam (MOBIC) 15 MG tablet Take 1 tablet (15 mg total) by mouth daily.   Marland Kitchen PROVENTIL HFA 108 (90 Base) MCG/ACT inhaler INHALE TWO PUFFS BY MOUTH EVERY 4 HOURS   . simvastatin (ZOCOR) 20 MG tablet Take 1 tablet (20 mg total) by mouth daily.   . Tiotropium Bromide-Olodaterol (STIOLTO RESPIMAT) 2.5-2.5 MCG/ACT AERS Inhale 2 puffs into the lungs daily.    No facility-administered encounter medications on file as of 10/25/2020.    Functional Status:  In your present state of health, do you have any difficulty performing the following activities: 09/20/2020 09/15/2020  Hearing? N N  Vision? N N  Difficulty concentrating or making decisions? N N  Comment - -  Walking or climbing stairs? N N  Comment gets winded walkiing steps -  Dressing or bathing? N N  Doing errands,  shopping? N Y  Conservation officer, nature and eating ? N -  Using the Toilet? N -  In the past six months, have you accidently leaked urine? N -  Do you have problems with loss of bowel control? N -  Managing your Medications? N -  Managing your Finances? N -  Housekeeping or managing your Housekeeping? N -  Some recent data might be hidden    Fall/Depression Screening: Fall Risk  09/20/2020 08/18/2020 08/08/2020  Falls in the past year? 0 0 0  Number falls in past yr: 0 1 -  Injury with Fall? 0 0 -  Risk for fall due to : No Fall Risks History of fall(s) -  Follow up - Falls evaluation completed -   PHQ 2/9 Scores 09/20/2020 08/08/2020 02/04/2020 10/16/2019 09/17/2019 12/03/2018 06/04/2018  PHQ - 2 Score 0 0 0 0 0 0 0    Assessment:  Goals Addressed              This Visit's Progress   .  Learn and Do Breathing Exercises   On track     Follow Up Date 11/24/20   - do breathing exercises every day - do exercises in a comfortable position that makes breathing as easy as possible    Why is this important?   Breathing exercises can help lessen the cough that comes with chronic obstructive pulmonary disease.  Doing the exercises will give you more energy.  They will also help you  to control your symptoms.    Notes:     Marland Kitchen  Manage Fatigue (Tiredness)   On track     Follow Up Date 11/24/20   - eat healthy - limit daytime naps Balance activity during the day with rest periods     Why is this important?   Feeling tired or worn out is a common symptom of COPD (chronic obstructive pulmonary disease).  Learning when you feel your best and when you need rest is important.  Managing the tiredness (fatigue) will help you be active and enjoy life.     Notes:     .  COMPLETED: Patient will not experience a hospital readmission over the next 31 days (pt-stated)           . Takes medications as prescribed including inhalers . Practices and uses pursed lip breathing for shortness of breath  recovery and prevention . Self assesses COPD action plan zone and makes appointment with provider if in the yellow zone for 48 hours without improvement. . Reinforced review of COPD education on breathing exercises    .  Track and Manage My Symptoms   On track     Follow Up Date 11/24/20   - follow rescue plan if symptoms flare-up - keep follow-up appointments     Why is this important?   Tracking your symptoms and other information about your health helps your doctor plan your care.  Write down the symptoms, the time of day, what you were doing and what medicine you are taking.  You will soon learn how to manage your symptoms.     Notes:     .  Track and Manage My Triggers   On track     Follow Up Date 11/24/20   - limit outdoor activity during cold weather    Why is this important?   Triggers are activities or things, like tobacco smoke or cold weather, that make your COPD (chronic obstructive pulmonary disease) flare-up.  Knowing these triggers helps you plan how to stay away from them.  When you cannot remove them, you can learn how to manage them.     Notes:       Assessment  COPD No worsening of symptoms, taking medications as prescribed, denies increase use of rescue inhaler    Plan:  Will plan follow up call in the next month and transition to health coach for ongoing disease management. Patient is telephone outreach call in the next month at agreed upon time frame.   Joylene Draft, RN, BSN  [facility] Management Coordinator  [phone] Mobile [phone]- Toll Free Main Office

## 2020-10-28 ENCOUNTER — Telehealth: Payer: Self-pay

## 2020-10-28 ENCOUNTER — Ambulatory Visit
Admission: RE | Admit: 2020-10-28 | Discharge: 2020-10-28 | Disposition: A | Discharge status: Home / Self Care | Payer: Medicare Other | Source: Ambulatory Visit | Attending: Family Medicine | Admitting: Family Medicine

## 2020-10-28 DIAGNOSIS — M25552 Pain in left hip: Secondary | ICD-10-CM | POA: Diagnosis not present

## 2020-10-28 NOTE — Telephone Encounter (Signed)
Pt. Aware of the orders you put in and to go to [place] for the x-ray. She also aware of the recommendations you provided.

## 2020-10-28 NOTE — Telephone Encounter (Signed)
Pt. Called stating that she saw you Monday for her hip pain and is still not any better. She said the meloxicam that was called in is not working for her hip pain.

## 2020-10-28 NOTE — Telephone Encounter (Signed)
Please advise patient that I put in orders for x-ray of her hip and for physical therapy (the two options we had discussed we would do if her pain wasn't improving. She should go to [place] for the x-ray. Have her continue to use the meloxicam, as well as heat vs ice (whichever works better), and the stretches I showed her.

## 2020-10-31 ENCOUNTER — Other Ambulatory Visit: Payer: Self-pay | Admitting: Family Medicine

## 2020-10-31 DIAGNOSIS — M25552 Pain in left hip: Secondary | ICD-10-CM

## 2020-10-31 MED ORDER — TRAMADOL HCL 50 MG PO TABS: 50.0000 mg | ORAL_TABLET | Freq: Three times a day (TID) | ORAL | 0 refills | Status: AC | PRN

## 2020-10-31 NOTE — Progress Notes (Signed)
No Known Allergies

## 2020-11-03 ENCOUNTER — Encounter: Payer: Self-pay | Admitting: Physical Therapy

## 2020-11-03 ENCOUNTER — Other Ambulatory Visit: Payer: Self-pay

## 2020-11-03 ENCOUNTER — Ambulatory Visit: Payer: Medicare Other | Attending: Family Medicine | Admitting: Physical Therapy

## 2020-11-03 DIAGNOSIS — R293 Abnormal posture: Secondary | ICD-10-CM | POA: Diagnosis not present

## 2020-11-03 DIAGNOSIS — M25552 Pain in left hip: Secondary | ICD-10-CM | POA: Diagnosis not present

## 2020-11-03 NOTE — Therapy (Signed)
[place], [place], [address] Phone: [phone]   Fax:  [phone]  Physical Therapy Evaluation  Patient Details  Name: Tiffany [name] MRN: [id number] Date of Birth: Dec 19, 1945 Referring Provider (PT): Rita Ohara, MD   Encounter Date: 11/03/2020   PT End of Session - 11/03/20 1641    Visit Number 1    Number of Visits 13    Date for PT Re-Evaluation 12/17/20    Authorization Type [employer] MCR    PT Start Time 1630    PT Stop Time 1735    PT Time Calculation (min) 65 min    Activity Tolerance Patient limited by pain    Behavior During Therapy [facility] for tasks assessed/performed           Past Medical History:  Diagnosis Date  . Abnormal PFT 09/02/14   moderate restrictive ventilatory defect.  Marland Kitchen BCC (basal cell carcinoma), face 2010   Dr. Allyson Sabal  . Colon polyp 2/05  . Diverticulosis 11/2010   seen on colonoscopy and air contrast BE  . Emphysema of lung ([place])   . Hyperlipidemia   . Hypertension    previously treated with meds, resolved  . MGUS (monoclonal gammopathy of unknown significance) 1999   prevously under care of Dr. Learta Codding  . Osteopenia    DEXA 01/31/2010 at [facility]; 2014 at [facility]  . 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 Tobacco abuse     Past Surgical History:  Procedure Laterality Date  . BASAL CELL CARCINOMA EXCISION  2010   L face, R temple  . BILATERAL SALPINGOOPHORECTOMY  2007   cancerous tumor R ovary  . CATARACT EXTRACTION  2009   bilateral  . COLONOSCOPY  11/2010   Dr. Wynetta Emery. Diverticulosis. BE also done--normal  . OMENTECTOMY  2007   and pelvic lymphadenectomy (with BSO)  . TONSILLECTOMY  age 50  . VAGINAL HYSTERECTOMY  late 20's    There were no vitals filed for this visit.    Subjective Assessment - 11/03/20 1637    Subjective The hip had been bothering me a little all week and I cooked thanksgiving dinner, pain increased on Friday and  saturday it hurt me really bad when I was cleaning my bathroom. Went to MD on Monday and began meloxicam and I laid to heat pad which is helpful. Stopped taking meloxicam and began tramadol which helps some but makes me sick. Did the dishes a couple of hours ago and was hurting when I finished. Got a SPC 2 days ago and am using it full time in Rt hand.    How long can you stand comfortably? fine first thing in the morning- by the time I get coffee and sit down it is bothering me.    Currently in Pain? Yes    Pain Score 8     Pain Location Buttocks    Pain Orientation Left    Pain Descriptors / Indicators Stabbing    Pain Type Acute pain    Pain Radiating Towards sometimes pain will go to knee- when I bend or start to get up.    Aggravating Factors  moving around, activity, bending over to lift frying pan    Pain Relieving Factors heat, sitting down              [facility] PT Assessment - 11/03/20 0001      Assessment   Medical Diagnosis Lt hip pain  Referring Provider (PT) Rita Ohara, MD    Onset Date/Surgical Date --   around thanksgiving   Hand Dominance Right    Prior Therapy no      Precautions   Precautions None      Restrictions   Weight Bearing Restrictions No      Balance Screen   Has the patient fallen in the past 6 months No      Prior Function   Level of Independence Independent with household mobility with device   was I prior to injury     Cognition   Overall Cognitive Status Within Functional Limits for tasks assessed      Observation/Other Assessments   Focus on Therapeutic Outcomes (FOTO)  29      Sensation   Additional Comments [facility]      Posture/Postural Control   Posture Comments Lt post pelvic rotation, flexed and Lt SB posture in standing      ROM / Strength   AROM / PROM / Strength AROM      AROM   Overall AROM Comments lumbar AROM very limited and guarded      Palpation   Palpation comment Lt paraspinals incr tension, TTP Lt buttock, denied  SIJ TTP                      Objective measurements completed on examination: See above findings.       [facility] Adult PT Treatment/Exercise - 11/03/20 0001      Exercises   Exercises Lumbar      Lumbar Exercises: Stretches   Other Lumbar Stretch Exercise 2 min Hesh self correction for Lt post innominate      Modalities   Modalities Moist Heat;Electrical Stimulation      Moist Heat Therapy   Number Minutes Moist Heat 15 Minutes   with estim   Moist Heat Location Lumbar Spine      Electrical Stimulation   Electrical Stimulation Location lumbar    Electrical Stimulation Action IFC    Electrical Stimulation Parameters 15 min to tolerance with heat    Electrical Stimulation Goals Pain      Manual Therapy   Manual Therapy Joint mobilization;Soft tissue mobilization    Joint Mobilization Lt SIJ upper quadrant post to anterior mob    Soft tissue mobilization Lt lumbar paraspinals, QL, Lt glut max/med/min                  PT Education - 11/03/20 1742    Education Details anatomy of condition, POC, HEP, exercise form/[place], FOTO    Person(s) Educated Patient;Spouse    Methods Explanation;Tactile cues;Verbal cues    Comprehension Verbalized understanding;Returned demonstration;Verbal cues required;Tactile cues required;Need further instruction            PT Short Term Goals - 11/03/20 1741      PT SHORT TERM GOAL #1   Title pt will be able to stand upright and walk short distances pain <=3/10    Baseline immediate pain upon standing at eval, 8/10 reported    Time 2    Period Weeks    Status New    Target Date 11/19/20             PT Long Term Goals - 11/03/20 1738      PT LONG TERM GOAL #1   Title Pt will be able to return to cleaning her house independently    Baseline unable due to pain at eval  Time 6    Period Weeks    Status New    Target Date 12/17/20      PT LONG TERM GOAL #2   Title gross hip strength to 5/5 to provide  lumbopelvic support    Baseline not appropriate to test at eval due to pain    Time 6    Period Weeks    Status New    Target Date 12/17/20      PT LONG TERM GOAL #3   Title will be able to bend and lift with proper form and without incr in pain    Baseline unable due to severe pain at eval    Time 6    Period Weeks    Status New    Target Date 12/17/20      PT LONG TERM GOAL #4   Title pt will be able to ambulate independently and confidently without AD    Baseline relying on SPC at eval    Time 6    Period Weeks    Status New    Target Date 12/17/20                  Plan - 11/03/20 1721    Clinical Impression Statement Pt presents to PT with complaints of Lt buttock pain of insidious onset that began about 2 weeks ago. Notable Left post innominate rotation was corrected with Hesh technique and she was able to stand in an upright posture following with continued pain. Significant hypertrophy of Lt lumbar paraspinals vs Right sid with Lt QL spasm as well. Suspect that she is getting radicular symptoms to Lt knee secondary to paraspinal tightness. Placed on ESTIM and heat following treatment today in attempt to decrease severe pain. Was able to get her on schedule tomorrow to follow up. Pt will benefit from skilled PT to address alignment and muscular tension and improve function.    Personal Factors and Comorbidities Comorbidity 1    Comorbidities osteopenia    Examination-Activity Limitations Squat;Bend;Bed Mobility;Stand;Locomotion Level;Transfers;Sit    Examination-Participation Restrictions Cleaning;Meal Prep;Community Activity    Stability/Clinical Decision Making Unstable/Unpredictable    Clinical Decision Making Moderate    Rehab Potential Fair    PT Frequency 2x / week    PT Duration 6 weeks    PT Treatment/Interventions ADLs/Self Care Home Management;Cryotherapy;Moist Heat;Electrical Stimulation;Gait training;Functional mobility training;Neuromuscular  re-education;Therapeutic exercise;Therapeutic activities;Patient/family education;Manual techniques;Passive range of motion;Dry needling;Taping    PT Next Visit Plan cont to decrease paraspinal tightness & correct alignment    PT Home Exercise Plan Hesh self correction for Lt post innom rotation, sit with pillow bw knees    Consulted and Agree with Plan of Care Patient           Patient will benefit from skilled therapeutic intervention in order to improve the following deficits and impairments:  Abnormal gait,Difficulty walking,Increased muscle spasms,Improper body mechanics,Decreased activity tolerance,Pain,Postural dysfunction  Visit Diagnosis: Pain in left hip - Plan: PT plan of care cert/re-cert  Abnormal posture - Plan: PT plan of care cert/re-cert     Problem List Patient Active Problem List   Diagnosis Date Noted  . COPD exacerbation ([place]) 09/14/2020  . Aortic atherosclerosis ([place]) 06/03/2018  . Atherosclerosis of right coronary artery 06/03/2018  . Essential hypertension 12/31/2016  . MGUS (monoclonal gammopathy of unknown significance) 10/30/2016  . Senile purpura ([place]) 08/31/2015  . COPD (chronic obstructive pulmonary disease) ([place]) 02/24/2015  . Osteopenia 05/12/2012  . Vitamin D deficiency 04/28/2012  .  Pure hypercholesterolemia 04/28/2012  . Former smoker 04/03/2012     C.  PT, DPT 11/03/20 5:48 PM   [place] [facility] [address] [place], [place], [address] Phone: [phone]   Fax:  [phone]  Name: Tiffany [name] MRN: [id number] Date of Birth: 11-11-1946

## 2020-11-04 ENCOUNTER — Encounter: Payer: Self-pay | Admitting: Physical Therapy

## 2020-11-04 ENCOUNTER — Ambulatory Visit: Payer: Medicare Other | Admitting: Physical Therapy

## 2020-11-04 DIAGNOSIS — M25552 Pain in left hip: Secondary | ICD-10-CM

## 2020-11-04 DIAGNOSIS — R293 Abnormal posture: Secondary | ICD-10-CM

## 2020-11-04 NOTE — Therapy (Signed)
[place], [place], [address] Phone: [phone]   Fax:  [phone]  Physical Therapy Treatment  Patient Details  Name: Tiffany [name] MRN: [id number] Date of Birth: Dec 31, 1945 Referring Provider (PT): Rita Ohara, MD   Encounter Date: 11/04/2020   PT End of Session - 11/04/20 1454    Visit Number 2    Number of Visits 13    Date for PT Re-Evaluation 12/17/20    Authorization Type BCBS MCR    PT Start Time 7035    PT Stop Time 1412    PT Time Calculation (min) 59 min    Activity Tolerance Patient tolerated treatment well    Behavior During Therapy [facility] for tasks assessed/performed           Past Medical History:  Diagnosis Date  . Abnormal PFT 09/02/14   moderate restrictive ventilatory defect.  Marland Kitchen BCC (basal cell carcinoma), face 2010   Dr. Allyson Sabal  . Colon polyp 2/05  . Diverticulosis 11/2010   seen on colonoscopy and air contrast BE  . Emphysema of lung ([place])   . Hyperlipidemia   . Hypertension    previously treated with meds, resolved  . MGUS (monoclonal gammopathy of unknown significance) 1999   prevously under care of Dr. Learta Codding  . Osteopenia    DEXA 01/31/2010 at [facility]; 2014 at [facility]  . 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 Tobacco abuse     Past Surgical History:  Procedure Laterality Date  . BASAL CELL CARCINOMA EXCISION  2010   L face, R temple  . BILATERAL SALPINGOOPHORECTOMY  2007   cancerous tumor R ovary  . CATARACT EXTRACTION  2009   bilateral  . COLONOSCOPY  11/2010   Dr. Wynetta Emery. Diverticulosis. BE also done--normal  . OMENTECTOMY  2007   and pelvic lymphadenectomy (with BSO)  . TONSILLECTOMY  age 53  . VAGINAL HYSTERECTOMY  late 20's    There were no vitals filed for this visit.   Subjective Assessment - 11/04/20 1315    Subjective worse when I bend forward. Did stand up straight when standing in lobby today.                              [place] Adult PT Treatment/Exercise - 11/04/20 0001      Lumbar Exercises: Aerobic   Stationary Bike no resistance 8 min      Moist Heat Therapy   Number Minutes Moist Heat 15 Minutes   with ESTIM   Moist Heat Location Lumbar Spine      Electrical Stimulation   Electrical Stimulation Location lumbar    Electrical Stimulation Action IFC    Electrical Stimulation Parameters 15 min to tol with heat    Electrical Stimulation Goals Pain      Manual Therapy   Manual therapy comments manual stretchig of Lt hip    Soft tissue mobilization Lt lumbar paraspinals, Lt glut med/min, TFL; hooklying mob with movement of Lt LE to glut med/min/TFL                    PT Short Term Goals - 11/03/20 1741      PT SHORT TERM GOAL #1   Title pt will be able to stand upright and walk short distances pain <=3/10    Baseline immediate pain upon standing at eval, 8/10 reported  Time 2    Period Weeks    Status New    Target Date 11/19/20             PT Long Term Goals - 11/03/20 1738      PT LONG TERM GOAL #1   Title Pt will be able to return to cleaning her house independently    Baseline unable due to pain at eval    Time 6    Period Weeks    Status New    Target Date 12/17/20      PT LONG TERM GOAL #2   Title gross hip strength to 5/5 to provide lumbopelvic support    Baseline not appropriate to test at eval due to pain    Time 6    Period Weeks    Status New    Target Date 12/17/20      PT LONG TERM GOAL #3   Title will be able to bend and lift with proper form and without incr in pain    Baseline unable due to severe pain at eval    Time 6    Period Weeks    Status New    Target Date 12/17/20      PT LONG TERM GOAL #4   Title pt will be able to ambulate independently and confidently without AD    Baseline relying on SPC at eval    Time 6    Period Weeks    Status New    Target Date 12/17/20                 Plan  - 11/04/20 1455    Clinical Impression Statement Continued with STM to lumbar paraspinals and hip abductor group. Noted to have significant trigger points. She was able to demo an upright posture in standing again today and reported overall she feels similar pain but with less intensity. +SLR on Lt but otherwise good flexibility overall. Suspect disc pathology and added prone on elbows to HEP.    PT Treatment/Interventions ADLs/Self Care Home Management;Cryotherapy;Moist Heat;Electrical Stimulation;Gait training;Functional mobility training;Neuromuscular re-education;Therapeutic exercise;Therapeutic activities;Patient/family education;Manual techniques;Passive range of motion;Dry needling;Taping    PT Next Visit Plan cont to decrease tightness- possible DN?, core stabilization when tolerated    PT Home Exercise Plan Hesh self correction for Lt post innom rotation, sit with pillow bw knees, STM with tennis ball, prone on elbows 3xday    Consulted and Agree with Plan of Care Patient           Patient will benefit from skilled therapeutic intervention in order to improve the following deficits and impairments:  Abnormal gait,Difficulty walking,Increased muscle spasms,Improper body mechanics,Decreased activity tolerance,Pain,Postural dysfunction  Visit Diagnosis: Pain in left hip  Abnormal posture     Problem List Patient Active Problem List   Diagnosis Date Noted  . COPD exacerbation ([place]) 09/14/2020  . Aortic atherosclerosis ([place]) 06/03/2018  . Atherosclerosis of right coronary artery 06/03/2018  . Essential hypertension 12/31/2016  . MGUS (monoclonal gammopathy of unknown significance) 10/30/2016  . Senile purpura ([place]) 08/31/2015  . COPD (chronic obstructive pulmonary disease) ([place]) 02/24/2015  . Osteopenia 05/12/2012  . Vitamin D deficiency 04/28/2012  . Pure hypercholesterolemia 04/28/2012  . Former smoker 04/03/2012     C.  PT, DPT 11/04/20 3:00 PM   [place], [place], [address] Phone: [phone]   Fax:  [phone]  Name: Tiffany [name] MRN: [id number] Date of Birth:  08/06/1946   

## 2020-11-16 ENCOUNTER — Telehealth: Payer: Self-pay

## 2020-11-16 NOTE — Telephone Encounter (Signed)
Patient came into the office for her completed patient assistance paperwork for Stiolto. Located paperwork and it required her signature. Had patient sign paperwork and I have faxed it to [facility] at [phone]. Received comfirmation. Will place in Dr. Cordelia Pen fax folder to keep to make sure everything goes through. Nothing further needed at this time.

## 2020-11-22 ENCOUNTER — Other Ambulatory Visit: Payer: Self-pay | Admitting: *Deleted

## 2020-11-22 NOTE — Patient Outreach (Signed)
[facility]) Care Management  [facility] Care Manager  11/22/2020   Tiffany [name] January 08, 1946 [id number]    Telephone Assessment   Referral received : 09/19/20 Referral source : Gean Maidens , [facility] Health Coach  Date of Admission : 09/14/20 Diagnosis : COPD exacerbation  Date of Discharge: 09/16/20 Facility: [facility]  Insurance: Blue cross blue shield   Subjective:  Successful outreach call to patient, she states that she is doing okay , breathing doing good, no increase in cough or sputum production or change in color. She states that she has all of her medications, using proventil inhaler about once a day her usual ,  along with her daily Stiolto . Patient discussed still having problems with her left hip area, she reports having a rotated pelvis per physical therapist, no fracture to hip denies recent fall.  Patient reports no pain when at rest but pain level up to 8/10 when up. She reports taking tylenol as needed. She has attended 2  physical therapy outpatient sessions, has visit on tomorrow and will begin twice weekly sessions.  She reports that her husband helps  her around the house and they cook together.  Discussed progress since recent hospital admission, patient agreeable to transition back to [employer] program.   Encounter Medications:  Outpatient Encounter Medications as of 11/22/2020  Medication Sig Note  . ergocalciferol (VITAMIN D2) 1.25 MG (50000 UT) capsule Take 1 capsule (50,000 Units total) by mouth once a week. 09/14/2020: Fridays  . losartan (COZAAR) 50 MG tablet Take 1 tablet (50 mg total) by mouth daily.   . meloxicam (MOBIC) 15 MG tablet Take 1 tablet (15 mg total) by mouth daily. (Patient not taking: Reported on 11/03/2020)   . PROVENTIL HFA 108 (90 Base) MCG/ACT inhaler INHALE TWO PUFFS BY MOUTH EVERY 4 HOURS   . simvastatin (ZOCOR) 20 MG tablet Take 1 tablet (20 mg total) by mouth daily.   . Tiotropium Bromide-Olodaterol (STIOLTO  RESPIMAT) 2.5-2.5 MCG/ACT AERS Inhale 2 puffs into the lungs daily.    No facility-administered encounter medications on file as of 11/22/2020.    Functional Status:  In your present state of health, do you have any difficulty performing the following activities: 09/20/2020 09/15/2020  Hearing? N N  Vision? N N  Difficulty concentrating or making decisions? N N  Comment - -  Walking or climbing stairs? N N  Comment gets winded walkiing steps -  Dressing or bathing? N N  Doing errands, shopping? N Y  Quarry manager and eating ? N -  Using the Toilet? N -  In the past six months, have you accidently leaked urine? N -  Do you have problems with loss of bowel control? N -  Managing your Medications? N -  Managing your Finances? N -  Housekeeping or managing your Housekeeping? N -  Some recent data might be hidden    Fall/Depression Screening: Fall Risk  09/20/2020 08/18/2020 08/08/2020  Falls in the past year? 0 0 0  Number falls in past yr: 0 1 -  Injury with Fall? 0 0 -  Risk for fall due to : No Fall Risks History of fall(s) -  Follow up - Falls evaluation completed -   PHQ 2/9 Scores 09/20/2020 08/08/2020 02/04/2020 10/16/2019 09/17/2019 12/03/2018 06/04/2018  PHQ - 2 Score 0 0 0 0 0 0 0    Assessment:  Goals Addressed            This Visit's Progress   .  Learn and Do Breathing Exercises   On track    Follow Up Date 12/26/20 Timeframe:  Long-Range Goal Priority:  Medium Start Date:   09/20/20                          Expected End Date:   12/26/20                      - do breathing exercises every day - do exercises in a comfortable position that makes breathing as easy as possible    Why is this important?   Breathing exercises can help lessen the cough that comes with chronic obstructive pulmonary disease.  Doing the exercises will give you more energy.  They will also help you to control your symptoms.    Notes:     Marland Kitchen Manage Fatigue (Tiredness)   On track     Follow Up Date 11/24/20   - eat healthy - limit daytime naps Balance activity during the day with rest periods     Why is this important?   Feeling tired or worn out is a common symptom of COPD (chronic obstructive pulmonary disease).  Learning when you feel your best and when you need rest is important.  Managing the tiredness (fatigue) will help you be active and enjoy life.     Notes:     . COMPLETED: [facility] Make and Keep All Appointments       Follow Up Date 12/26/20 Timeframe:  Long-Range Goal Priority:  Medium Start Date:    09/20/20                         Expected End Date:  12/26/20                       - keep a calendar with appointment dates  Will schedule and keep all appointments     Why is this important?   Part of staying healthy is seeing the doctor for follow-up care.  If you forget your appointments, there are some things you can do to stay on track.    Notes:      . Track and Manage My Symptoms   On track    Follow Up Date 12/26/20 Timeframe:  Long-Range Goal Priority:  High Start Date:    09/20/20                         Expected End Date:  12/26/20                       - follow rescue plan if symptoms flare-up - keep follow-up appointments     Why is this important?   Tracking your symptoms and other information about your health helps your doctor plan your care.  Write down the symptoms, the time of day, what you were doing and what medicine you are taking.  You will soon learn how to manage your symptoms.     Notes:     . Track and Manage My Triggers   On track    Follow Up Date 12/26/20 Timeframe:  Long-Range Goal Priority:  Medium Start Date:   09/20/20                          Expected End Date:  12/26/20                       - limit outdoor activity during cold weather    Why is this important?   Triggers are activities or things, like tobacco smoke or cold weather, that make your COPD (chronic obstructive pulmonary disease) flare-up.   Knowing these triggers helps you plan how to stay away from them.  When you cannot remove them, you can learn how to manage them.     Notes:      Patient has completed complex care management post discharge goals,no new care management care needs identified at this time.  Patient is agreeable to transition back to health coach for continued disease management of ongoing chronic condition of COPD.  Reinforced patient to contact this  CM sooner if new concerns.   Plan:  Follow-up:  Patient agrees to Care Plan and Follow-up. Will place order to transition to Disease management.    Joylene Draft, RN, BSN  [place] Management Coordinator  [phone]- Mobile [phone]- Toll Free Main Office

## 2020-11-23 ENCOUNTER — Ambulatory Visit: Payer: Medicare Other

## 2020-11-23 ENCOUNTER — Other Ambulatory Visit: Payer: Self-pay

## 2020-11-23 ENCOUNTER — Other Ambulatory Visit: Payer: Self-pay | Admitting: *Deleted

## 2020-11-23 DIAGNOSIS — M25552 Pain in left hip: Secondary | ICD-10-CM | POA: Diagnosis not present

## 2020-11-23 DIAGNOSIS — R293 Abnormal posture: Secondary | ICD-10-CM | POA: Diagnosis not present

## 2020-11-23 NOTE — Therapy (Signed)
[place], [place], [address] Phone: [phone]   Fax:  [phone]  Physical Therapy Treatment  Patient Details  Name: Tiffany [name] MRN: [id number] Date of Birth: January 31, 1946 Referring Provider (PT): Rita Ohara, MD   Encounter Date: 11/23/2020   PT End of Session - 11/23/20 1057    Visit Number 3    Number of Visits 13    Date for PT Re-Evaluation 12/17/20    Authorization Type BCBS MCR    PT Start Time 1058    PT Stop Time 1140    PT Time Calculation (min) 42 min    Activity Tolerance Patient tolerated treatment well    Behavior During Therapy [facility] for tasks assessed/performed           Past Medical History:  Diagnosis Date  . Abnormal PFT 09/02/14   moderate restrictive ventilatory defect.  Marland Kitchen BCC (basal cell carcinoma), face 2010   Dr. Allyson Sabal  . Colon polyp 2/05  . Diverticulosis 11/2010   seen on colonoscopy and air contrast BE  . Emphysema of lung ([place])   . Hyperlipidemia   . Hypertension    previously treated with meds, resolved  . MGUS (monoclonal gammopathy of unknown significance) 1999   prevously under care of Dr. Learta Codding  . Osteopenia    DEXA 01/31/2010 at [facility]; 2014 at [facility]  . 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 Tobacco abuse     Past Surgical History:  Procedure Laterality Date  . BASAL CELL CARCINOMA EXCISION  2010   L face, R temple  . BILATERAL SALPINGOOPHORECTOMY  2007   cancerous tumor R ovary  . CATARACT EXTRACTION  2009   bilateral  . COLONOSCOPY  11/2010   Dr. Wynetta Emery. Diverticulosis. BE also done--normal  . OMENTECTOMY  2007   and pelvic lymphadenectomy (with BSO)  . TONSILLECTOMY  age 78  . VAGINAL HYSTERECTOMY  late 20's    There were no vitals filed for this visit.   Subjective Assessment - 11/23/20 1100    Subjective There has been no change in my hip pain since last session. It still hurts. She has been comleting soft  tissue mobilization with the ball, but that's it.    Currently in Pain? Yes    Pain Score 10-Worst pain ever    Pain Location Hip    Pain Orientation Left    Pain Descriptors / Indicators Stabbing    Pain Type Chronic pain    Pain Onset More than a month ago              [facility] PT Assessment - 11/23/20 0001      Posture/Postural Control   Posture Comments Lt lateral shift correction, Lt posterior rotated innominate                         [facility] Adult PT Treatment/Exercise - 11/23/20 0001      Self-Care   Other Self-Care Comments  see patient education      Lumbar Exercises: Standing   Other Standing Lumbar Exercises Lt lateral shift correction 2 x 10    Other Standing Lumbar Exercises repeated extension 2 x 10      Lumbar Exercises: Prone   Other Prone Lumbar Exercises prone lying with hip offset 5 minutes      Manual Therapy   Manual therapy comments muscle energy technique to improve pelvic alignment, LAD  LLE                  PT Education - 11/23/20 1117    Education Details Education on updated HEP. education on centralization phenomenon. education on movements to currently avoid.    Person(s) Educated Patient    Methods Explanation;Demonstration;Handout    Comprehension Verbalized understanding;Returned demonstration            PT Short Term Goals - 11/03/20 1741      PT SHORT TERM GOAL #1   Title pt will be able to stand upright and walk short distances pain <=3/10    Baseline immediate pain upon standing at eval, 8/10 reported    Time 2    Period Weeks    Status New    Target Date 11/19/20             PT Long Term Goals - 11/03/20 1738      PT LONG TERM GOAL #1   Title Pt will be able to return to cleaning her house independently    Baseline unable due to pain at eval    Time 6    Period Weeks    Status New    Target Date 12/17/20      PT LONG TERM GOAL #2   Title gross hip strength to 5/5 to provide lumbopelvic  support    Baseline not appropriate to test at eval due to pain    Time 6    Period Weeks    Status New    Target Date 12/17/20      PT LONG TERM GOAL #3   Title will be able to bend and lift with proper form and without incr in pain    Baseline unable due to severe pain at eval    Time 6    Period Weeks    Status New    Target Date 12/17/20      PT LONG TERM GOAL #4   Title pt will be able to ambulate independently and confidently without AD    Baseline relying on SPC at eval    Time 6    Period Weeks    Status New    Target Date 12/17/20                 Plan - 11/23/20 1115    Clinical Impression Statement Patient noted to have Lt lateral shift and Lt posterior rotated innominate at beginning of session. Patient has fair tolerance to lateral shift correction, though positively responds reporting a decrease in Lt lateral hip pain following repeated movement. Patient demonstrates further positive response to repeated lumbar extension in standing with pain centralizatin from lateral hip rated as 10/10 to posterior hip rated as 6/10. Her pelvic symmetry easily corrects with muscle energy technique and her forward flexed posture is improved at end of session.    Personal Factors and Comorbidities Comorbidity 1    PT Treatment/Interventions ADLs/Self Care Home Management;Cryotherapy;Moist Heat;Electrical Stimulation;Gait training;Functional mobility training;Neuromuscular re-education;Therapeutic exercise;Therapeutic activities;Patient/family education;Manual techniques;Passive range of motion;Dry needling;Taping    PT Next Visit Plan assess pelvic alignment. manual therapy as needed. assess response to repeated movement    PT Home Exercise Plan Access Code [email or web address]    Consulted and Agree with Plan of Care Patient           Patient will benefit from skilled therapeutic intervention in order to improve the following deficits and impairments:  Abnormal gait,Difficulty  walking,Increased muscle spasms,Improper body mechanics,Decreased activity  tolerance,Pain,Postural dysfunction  Visit Diagnosis: Pain in left hip  Abnormal posture     Problem List Patient Active Problem List   Diagnosis Date Noted  . COPD exacerbation ([facility]) 09/14/2020  . Aortic atherosclerosis ([facility]) 06/03/2018  . Atherosclerosis of right coronary artery 06/03/2018  . Essential hypertension 12/31/2016  . MGUS (monoclonal gammopathy of unknown significance) 10/30/2016  . Senile purpura ([facility]) 08/31/2015  . COPD (chronic obstructive pulmonary disease) ([facility]) 02/24/2015  . Osteopenia 05/12/2012  . Vitamin D deficiency 04/28/2012  . Pure hypercholesterolemia 04/28/2012  . Former smoker 04/03/2012   Letitia Libra, PT, DPT, ATC 11/23/20 11:45 AM  [facility] [address] [place], [place], [address] Phone: [phone]   Fax:  [phone]  Name: Tiffany [name] MRN: [id number] Date of Birth: 01/21/1946

## 2020-11-28 ENCOUNTER — Ambulatory Visit: Payer: Medicare Other

## 2020-11-30 ENCOUNTER — Ambulatory Visit: Payer: Medicare Other | Attending: Family Medicine

## 2020-11-30 ENCOUNTER — Other Ambulatory Visit: Payer: Self-pay

## 2020-11-30 DIAGNOSIS — M25552 Pain in left hip: Secondary | ICD-10-CM

## 2020-11-30 DIAGNOSIS — R293 Abnormal posture: Secondary | ICD-10-CM

## 2020-11-30 NOTE — Therapy (Signed)
[place], [place], [address] Phone: [phone]   Fax:  [phone]  Physical Therapy Treatment  Patient Details  Name: Tiffany [name] MRN: [id number] Date of Birth: 11/09/1946 Referring Provider (PT): Rita Ohara, MD   Encounter Date: 11/30/2020   PT End of Session - 11/30/20 1030    Visit Number 4    Number of Visits 13    Date for PT Re-Evaluation 12/17/20    Authorization Type BCBS MCR    PT Start Time [id number]    PT Stop Time 1055    PT Time Calculation (min) 40 min    Activity Tolerance Patient tolerated treatment well    Behavior During Therapy [facility] for tasks assessed/performed           Past Medical History:  Diagnosis Date  . Abnormal PFT 09/02/14   moderate restrictive ventilatory defect.  Marland Kitchen BCC (basal cell carcinoma), face 2010   Dr. Allyson Sabal  . Colon polyp 2/05  . Diverticulosis 11/2010   seen on colonoscopy and air contrast BE  . Emphysema of lung ([place])   . Hyperlipidemia   . Hypertension    previously treated with meds, resolved  . MGUS (monoclonal gammopathy of unknown significance) 1999   prevously under care of Dr. Learta Codding  . Osteopenia    DEXA 01/31/2010 at [facility]; 2014 at [facility]  . 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 Tobacco abuse     Past Surgical History:  Procedure Laterality Date  . BASAL CELL CARCINOMA EXCISION  2010   L face, R temple  . BILATERAL SALPINGOOPHORECTOMY  2007   cancerous tumor R ovary  . CATARACT EXTRACTION  2009   bilateral  . COLONOSCOPY  11/2010   Dr. Wynetta Emery. Diverticulosis. BE also done--normal  . OMENTECTOMY  2007   and pelvic lymphadenectomy (with BSO)  . TONSILLECTOMY  age 75  . VAGINAL HYSTERECTOMY  late 20's    There were no vitals filed for this visit.   Subjective Assessment - 11/30/20 1018    Subjective She reports no pain with walking and standing but still feels pain when she is sitting or bending. She  has not needed to use her heating pad. She reports pain rated as 4/10 with sitting/bending. She reports the repeated extension helps with her pain and she is doing it whenever she remembers.    Currently in Pain? No/denies                             [facility] Adult PT Treatment/Exercise - 11/30/20 0001      Ambulation/Gait   Ambulation Distance (Feet) 185 Feet    Assistive device None    Gait Pattern Step-through pattern    Gait velocity slow      Self-Care   Other Self-Care Comments  see patient education      Lumbar Exercises: Stretches   Other Lumbar Stretch Exercise LTR 2 min      Lumbar Exercises: Standing   Other Standing Lumbar Exercises Lt lateral shift correction 1 x 10    Other Standing Lumbar Exercises repeated extension in standing 3 x 10      Lumbar Exercises: Seated   Other Seated Lumbar Exercises seated narch 2 x 10      Lumbar Exercises: Supine   Clam Limitations 2 x 10 green TB    Bridge Limitations 2 x 10 pn!  Manual Therapy   Soft tissue mobilization STM/DTM to Lt lumbar paraspinals and Lt posterior gluteal musculature                  PT Education - 11/30/20 1052    Education Details Education on updated HEP. Review of frequency of repeated movement.    Person(s) Educated Patient    Methods Explanation;Demonstration;Handout    Comprehension Verbalized understanding;Returned demonstration            PT Short Term Goals - 11/03/20 1741      PT SHORT TERM GOAL #1   Title pt will be able to stand upright and walk short distances pain <=3/10    Baseline immediate pain upon standing at eval, 8/10 reported    Time 2    Period Weeks    Status New    Target Date 11/19/20             PT Long Term Goals - 11/03/20 1738      PT LONG TERM GOAL #1   Title Pt will be able to return to cleaning her house independently    Baseline unable due to pain at eval    Time 6    Period Weeks    Status New    Target Date 12/17/20       PT LONG TERM GOAL #2   Title gross hip strength to 5/5 to provide lumbopelvic support    Baseline not appropriate to test at eval due to pain    Time 6    Period Weeks    Status New    Target Date 12/17/20      PT LONG TERM GOAL #3   Title will be able to bend and lift with proper form and without incr in pain    Baseline unable due to severe pain at eval    Time 6    Period Weeks    Status New    Target Date 12/17/20      PT LONG TERM GOAL #4   Title pt will be able to ambulate independently and confidently without AD    Baseline relying on SPC at eval    Time 6    Period Weeks    Status New    Target Date 12/17/20                 Plan - 11/30/20 1030    Clinical Impression Statement Patient arrives with symmetrical pelvic alignment, thouhg continues to have notable Lt lateral shift in standing. She continues to respond well to extension biased exercises reporting a reduction in pain with repeated extension in standing and prone lying. Patient demonstrates improved posture and gait mechanics with ability to demo step through gait pattern with appropritae weight shift without use of SPC, though slow gait speed. Good tolerance to introduction of hip strengthening exercises with exception of hip bridge as this causes hip pain that quickly subsides after completion.    Personal Factors and Comorbidities Comorbidity 1    PT Treatment/Interventions ADLs/Self Care Home Management;Cryotherapy;Moist Heat;Electrical Stimulation;Gait training;Functional mobility training;Neuromuscular re-education;Therapeutic exercise;Therapeutic activities;Patient/family education;Manual techniques;Passive range of motion;Dry needling;Taping    PT Next Visit Plan NuStep, continue hip/core strengthening.    PT Home Exercise Plan Access Code [email or web address]    Consulted and Agree with Plan of Care Patient           Patient will benefit from skilled therapeutic intervention in order to improve the  following deficits and impairments:  Abnormal gait,Difficulty walking,Increased muscle spasms,Improper body mechanics,Decreased activity tolerance,Pain,Postural dysfunction  Visit Diagnosis: Pain in left hip  Abnormal posture     Problem List Patient Active Problem List   Diagnosis Date Noted  . COPD exacerbation (HCC) 09/14/2020  . Aortic atherosclerosis (HCC) 06/03/2018  . Atherosclerosis of right coronary artery 06/03/2018  . Essential hypertension 12/31/2016  . MGUS (monoclonal gammopathy of unknown significance) 10/30/2016  . Senile purpura (HCC) 08/31/2015  . COPD (chronic obstructive pulmonary disease) (HCC) 02/24/2015  . Osteopenia 05/12/2012  . Vitamin D deficiency 04/28/2012  . Pure hypercholesterolemia 04/28/2012  . Former smoker 04/03/2012   Letitia Libra, PT, DPT, ATC 11/30/20 10:59 AM  [facility] [address] [place], [place], [address] Phone: [phone]   Fax:  [phone]  Name: Tiffany [name] MRN: [id number] Date of Birth: 1945-12-07

## 2020-12-05 ENCOUNTER — Other Ambulatory Visit: Payer: Self-pay

## 2020-12-05 ENCOUNTER — Ambulatory Visit: Payer: Medicare Other

## 2020-12-05 DIAGNOSIS — M25552 Pain in left hip: Secondary | ICD-10-CM

## 2020-12-05 DIAGNOSIS — R293 Abnormal posture: Secondary | ICD-10-CM | POA: Diagnosis not present

## 2020-12-05 NOTE — Therapy (Signed)
[place], [place], [address] Phone: [phone]   Fax:  [phone]  Physical Therapy Treatment  Patient Details  Name: Tiffany [name] MRN: [id number] Date of Birth: 05-03-1946 Referring Provider (PT): Rita Ohara, MD   Encounter Date: 12/05/2020   PT End of Session - 12/05/20 1014    Visit Number 5    Number of Visits 13    Date for PT Re-Evaluation 12/17/20    Authorization Type BCBS MCR    PT Start Time [id number]    PT Stop Time 1057    PT Time Calculation (min) 42 min    Activity Tolerance Patient tolerated treatment well    Behavior During Therapy [facility] for tasks assessed/performed           Past Medical History:  Diagnosis Date  . Abnormal PFT 09/02/14   moderate restrictive ventilatory defect.  Marland Kitchen BCC (basal cell carcinoma), face 2010   Dr. Allyson Sabal  . Colon polyp 2/05  . Diverticulosis 11/2010   seen on colonoscopy and air contrast BE  . Emphysema of lung ([place])   . Hyperlipidemia   . Hypertension    previously treated with meds, resolved  . MGUS (monoclonal gammopathy of unknown significance) 1999   prevously under care of Dr. Learta Codding  . Osteopenia    [facility] 01/31/2010 at [facility]; 2014 at [facility]  . 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 Tobacco abuse     Past Surgical History:  Procedure Laterality Date  . BASAL CELL CARCINOMA EXCISION  2010   L face, R temple  . BILATERAL SALPINGOOPHORECTOMY  2007   cancerous tumor R ovary  . CATARACT EXTRACTION  2009   bilateral  . COLONOSCOPY  11/2010   Dr. Wynetta Emery. Diverticulosis. BE also done--normal  . OMENTECTOMY  2007   and pelvic lymphadenectomy (with BSO)  . TONSILLECTOMY  age 21  . VAGINAL HYSTERECTOMY  late 20's    There were no vitals filed for this visit.   Subjective Assessment - 12/05/20 1021    Subjective patient reports she is feeling much better with minimal pain about Lt low back/posterior hip. She hasn't  used her cane since last session and was able to clean this weekend with some pain during mopping/bending, but was able to clean for about 2 hours.    Currently in Pain? Yes    Pain Score 6     Pain Location Hip    Pain Orientation Left    Pain Descriptors / Indicators Dull    Pain Type Chronic pain    Pain Onset More than a month ago    Pain Frequency Intermittent              [facility] PT Assessment - 12/05/20 0001      Posture/Postural Control   Posture Comments Lt lateral shift, symmetrical pelvic alignment                         [facility] Adult PT Treatment/Exercise - 12/05/20 0001      Lumbar Exercises: Stretches   Other Lumbar Stretch Exercise quad stretch 1 min each      Lumbar Exercises: Standing   Other Standing Lumbar Exercises Lt lateral shift correction    Other Standing Lumbar Exercises repeated extension 1 x 10      Lumbar Exercises: Seated   Sit to Stand Limitations 2 x 10 with ball reach overhead  Lumbar Exercises: Supine   Other Supine Lumbar Exercises LTR 2 min      Lumbar Exercises: Sidelying   Hip Abduction Limitations 2 x 10      Knee/Hip Exercises: Standing   Abduction Limitations 2 x 10    Extension Limitations 2 x 10      Manual Therapy   Soft tissue mobilization STM/DTM to Lt lumbar paraspinals and Lt posterior gluteal musculature                    PT Short Term Goals - 11/03/20 1741      PT SHORT TERM GOAL #1   Title pt will be able to stand upright and walk short distances pain <=3/10    Baseline immediate pain upon standing at eval, 8/10 reported    Time 2    Period Weeks    Status New    Target Date 11/19/20             PT Long Term Goals - 11/03/20 1738      PT LONG TERM GOAL #1   Title Pt will be able to return to cleaning her house independently    Baseline unable due to pain at eval    Time 6    Period Weeks    Status New    Target Date 12/17/20      PT LONG TERM GOAL #2   Title gross  hip strength to 5/5 to provide lumbopelvic support    Baseline not appropriate to test at eval due to pain    Time 6    Period Weeks    Status New    Target Date 12/17/20      PT LONG TERM GOAL #3   Title will be able to bend and lift with proper form and without incr in pain    Baseline unable due to severe pain at eval    Time 6    Period Weeks    Status New    Target Date 12/17/20      PT LONG TERM GOAL #4   Title pt will be able to ambulate independently and confidently without AD    Baseline relying on [facility] at eval    Time 6    Period Weeks    Status New    Target Date 12/17/20                 Plan - 12/05/20 1032    Clinical Impression Statement Patient again demonstrates symmetrical pelvic alignment, though Lt lateral shift present at beginning of session that is corrected with Lt lateral shift correction. Light progression of hip strengthening exercises, now that pain levels have much decreased since initial evaluation with patient challenged in maintaining pelvic stability and requiring moderate postural cues to decrease forward flexed posturing during standing activity. Patient denied any pain during standing activity, though reports occasional pulling about Lt lateral hip.    Personal Factors and Comorbidities Comorbidity 1    PT Treatment/Interventions ADLs/Self Care Home Management;Cryotherapy;Moist Heat;Electrical Stimulation;Gait training;Functional mobility training;Neuromuscular re-education;Therapeutic exercise;Therapeutic activities;Patient/family education;Manual techniques;Passive range of motion;Dry needling;Taping    PT Next Visit Plan Standing hip/core strengthening. Consider dead lift. Body mechanics with functional activity. FOTO. Update HEP.    PT Home Exercise Plan Access Code [email or web address]    Consulted and Agree with Plan of Care Patient           Patient will benefit from skilled therapeutic intervention in order to improve the following  deficits  and impairments:  Abnormal gait,Difficulty walking,Increased muscle spasms,Improper body mechanics,Decreased activity tolerance,Pain,Postural dysfunction  Visit Diagnosis: Pain in left hip  Abnormal posture     Problem List Patient Active Problem List   Diagnosis Date Noted  . COPD exacerbation ([place]) 09/14/2020  . Aortic atherosclerosis ([place]) 06/03/2018  . Atherosclerosis of right coronary artery 06/03/2018  . Essential hypertension 12/31/2016  . MGUS (monoclonal gammopathy of unknown significance) 10/30/2016  . Senile purpura ([place]) 08/31/2015  . COPD (chronic obstructive pulmonary disease) ([place]) 02/24/2015  . Osteopenia 05/12/2012  . Vitamin D deficiency 04/28/2012  . Pure hypercholesterolemia 04/28/2012  . Former smoker 04/03/2012   Gwendolyn Grant, PT, DPT, ATC 12/05/20 10:59 AM  [facility] [address] [place], [place], [address] Phone: [phone]   Fax:  [phone]  Name: Tiffany [name] MRN: [id number] Date of Birth: 10/29/1946

## 2020-12-07 ENCOUNTER — Ambulatory Visit: Payer: Medicare Other

## 2020-12-07 ENCOUNTER — Other Ambulatory Visit: Payer: Self-pay

## 2020-12-07 DIAGNOSIS — M25552 Pain in left hip: Secondary | ICD-10-CM

## 2020-12-07 DIAGNOSIS — R293 Abnormal posture: Secondary | ICD-10-CM | POA: Diagnosis not present

## 2020-12-07 NOTE — Therapy (Signed)
[place], [place], [address] Phone: [phone]   Fax:  [phone]  Physical Therapy Treatment  Patient Details  Name: Tiffany [name] MRN: [id number] Date of Birth: [birth year] Referring Provider (PT): Rita Ohara, MD   Encounter Date: 12/07/2020   PT End of Session - 12/07/20 1016    Visit Number 6    Number of Visits 13    Date for PT Re-Evaluation 12/17/20    Authorization Type BCBS MCR    Progress Note Due on Visit 10    PT Start Time 1015    PT Stop Time 1057    PT Time Calculation (min) 42 min    Activity Tolerance Patient tolerated treatment well    Behavior During Therapy [facility] for tasks assessed/performed           Past Medical History:  Diagnosis Date  . Abnormal PFT 09/02/14   moderate restrictive ventilatory defect.  Marland Kitchen BCC (basal cell carcinoma), face 2010   Dr. Allyson Sabal  . Colon polyp 2/05  . Diverticulosis 11/2010   seen on colonoscopy and air contrast BE  . Emphysema of lung ([place])   . Hyperlipidemia   . Hypertension    previously treated with meds, resolved  . MGUS (monoclonal gammopathy of unknown significance) 1999   prevously under care of Dr. Learta Codding  . Osteopenia    [facility] 01/31/2010 [facility]; 2014 at [facility]  . 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 Tobacco abuse     Past Surgical History:  Procedure Laterality Date  . BASAL CELL CARCINOMA EXCISION  2010   L face, R temple  . BILATERAL SALPINGOOPHORECTOMY  2007   cancerous tumor R ovary  . CATARACT EXTRACTION  2009   bilateral  . COLONOSCOPY  11/2010   Dr. Wynetta Emery. Diverticulosis. BE also done--normal  . OMENTECTOMY  2007   and pelvic lymphadenectomy (with BSO)  . TONSILLECTOMY  age 75  . VAGINAL HYSTERECTOMY  late 20's    There were no vitals filed for this visit.   Subjective Assessment - 12/07/20 1020    Subjective Patient reports she has been feeling much better, but still gets a  couple spots of pain when she is doing bending activity like making her bed this morning. No pain when she is just standing now.    Currently in Pain? No/denies              [facility] PT Assessment - 12/07/20 0001      Observation/Other Assessments   Focus on Therapeutic Outcomes (FOTO)  58% function                         [facility] Adult PT Treatment/Exercise - 12/07/20 0001      Self-Care   Other Self-Care Comments  see patient education      Lumbar Exercises: Public affairs consultant Limitations 90 sec each prone      Lumbar Exercises: Standing   Other Standing Lumbar Exercises Lt lateral shift correction    Other Standing Lumbar Exercises repeated extension 2 x 10      Knee/Hip Exercises: Standing   Functional Squat Limitations 2 x 10; 5# KB to 8in step    Other Standing Knee Exercises hip hinge 1 x 10    Other Standing Knee Exercises golfers lift 1 x 3      Knee/Hip Exercises: Supine   Bridges Limitations  2 x10 partial range      Knee/Hip Exercises: Sidelying   Clams 2 x 10 red TB                  PT Education - 12/07/20 1048    Education Details updated HEP. safe lifting/bending mechanics. FOTO results    Person(s) Educated Patient    Methods Explanation;Demonstration;Verbal cues;Tactile cues;Handout    Comprehension Verbalized understanding;Returned demonstration;Need further instruction;Verbal cues required            PT Short Term Goals - 12/07/20 1053      PT SHORT TERM GOAL #1   Title pt will be able to stand upright and walk short distances pain <=3/10    Baseline immediate pain upon standing at eval, 8/10 reported; no pain with standing 12/07/20    Time 2    Period Weeks    Status Achieved    Target Date 11/19/20             PT Long Term Goals - 11/03/20 1738      PT LONG TERM GOAL #1   Title Pt will be able to return to cleaning her house independently    Baseline unable due to pain at eval    Time 6    Period Weeks     Status New    Target Date 12/17/20      PT LONG TERM GOAL #2   Title gross hip strength to 5/5 to provide lumbopelvic support    Baseline not appropriate to test at eval due to pain    Time 6    Period Weeks    Status New    Target Date 12/17/20      PT LONG TERM GOAL #3   Title will be able to bend and lift with proper form and without incr in pain    Baseline unable due to severe pain at eval    Time 6    Period Weeks    Status New    Target Date 12/17/20      PT LONG TERM GOAL #4   Title pt will be able to ambulate independently and confidently without AD    Baseline relying on SPC at eval    Time 6    Period Weeks    Status New    Target Date 12/17/20                 Plan - 12/07/20 1029    Clinical Impression Statement Patient's FOTO score has significantly improved from baseline at 58% function during today's session. Able to introduce lifting and bending activity with patient requiring verbal and tactile cues to decrease excessive trunk flexion with squatting activity with ability to correct once cued. Patient has difficulty performing hip hinge as she demonstrates excessive trunk flexion with moderate ability to correct, though needs continued emphasis at future sessions. Patient recommended to utilize golfers lift technique at home to decrease stress on low back with bending/reaching activity.    Personal Factors and Comorbidities Comorbidity 1    PT Treatment/Interventions ADLs/Self Care Home Management;Cryotherapy;Moist Heat;Electrical Stimulation;Gait training;Functional mobility training;Neuromuscular re-education;Therapeutic exercise;Therapeutic activities;Patient/family education;Manual techniques;Passive range of motion;Dry needling;Taping    PT Next Visit Plan Standing hip/core strengthening. Consider dead lift. Body mechanics with functional activity. FOTO. Update HEP.    PT Home Exercise Plan Access Code [email or web address]    Consulted and Agree with Plan of Care  Patient  Patient will benefit from skilled therapeutic intervention in order to improve the following deficits and impairments:  Abnormal gait,Difficulty walking,Increased muscle spasms,Improper body mechanics,Decreased activity tolerance,Pain,Postural dysfunction  Visit Diagnosis: Pain in left hip  Abnormal posture     Problem List Patient Active Problem List   Diagnosis Date Noted  . COPD exacerbation ([place]) 09/14/2020  . Aortic atherosclerosis ([place]) 06/03/2018  . Atherosclerosis of right coronary artery 06/03/2018  . Essential hypertension 12/31/2016  . MGUS (monoclonal gammopathy of unknown significance) 10/30/2016  . Senile purpura ([place]) 08/31/2015  . COPD (chronic obstructive pulmonary disease) ([place]) 02/24/2015  . Osteopenia 05/12/2012  . Vitamin D deficiency 04/28/2012  . Pure hypercholesterolemia 04/28/2012  . Former smoker 04/03/2012   Gwendolyn Grant, PT, DPT, ATC 12/07/20 11:00 AM  [facility] [address] [place], [place], [address] Phone: [phone]   Fax:  [phone]  Name: Tiffany [name] MRN: [id number] Date of Birth: 11/29/1945

## 2020-12-12 ENCOUNTER — Ambulatory Visit: Payer: Medicare Other

## 2020-12-14 ENCOUNTER — Ambulatory Visit: Payer: Medicare Other

## 2020-12-14 ENCOUNTER — Other Ambulatory Visit: Payer: Self-pay

## 2020-12-14 DIAGNOSIS — R293 Abnormal posture: Secondary | ICD-10-CM | POA: Diagnosis not present

## 2020-12-14 DIAGNOSIS — M25552 Pain in left hip: Secondary | ICD-10-CM | POA: Diagnosis not present

## 2020-12-14 NOTE — Therapy (Signed)
[place], [place], [address] Phone: [phone]   Fax:  [phone]  Physical Therapy Treatment/Discharge   Patient Details  Name: Tiffany [name] MRN: [id number] Date of Birth: [birth year] Referring Provider (PT): Rita Ohara, MD   Encounter Date: 12/14/2020   PT End of Session - 12/14/20 1012    Visit Number 7    Number of Visits 13    Date for PT Re-Evaluation 12/17/20    Authorization Type BCBS MCR    Progress Note Due on Visit 10    PT Start Time 1010    PT Stop Time 1051    PT Time Calculation (min) 41 min    Activity Tolerance Patient tolerated treatment well    Behavior During Therapy [facility] for tasks assessed/performed           Past Medical History:  Diagnosis Date  . Abnormal PFT 09/02/14   moderate restrictive ventilatory defect.  Marland Kitchen BCC (basal cell carcinoma), face 2010   Dr. Allyson Sabal  . Colon polyp 2/05  . Diverticulosis 11/2010   seen on colonoscopy and air contrast BE  . Emphysema of lung ([place])   . Hyperlipidemia   . Hypertension    previously treated with meds, resolved  . MGUS (monoclonal gammopathy of unknown significance) 1999   prevously under care of Dr. Learta Codding  . Osteopenia    [facility] 01/31/2010 at [facility]; 2014 at [facility]  . 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 Tobacco abuse     Past Surgical History:  Procedure Laterality Date  . BASAL CELL CARCINOMA EXCISION  2010   L face, R temple  . BILATERAL SALPINGOOPHORECTOMY  2007   cancerous tumor R ovary  . CATARACT EXTRACTION  2009   bilateral  . COLONOSCOPY  11/2010   Dr. Wynetta Emery. Diverticulosis. BE also done--normal  . OMENTECTOMY  2007   and pelvic lymphadenectomy (with BSO)  . TONSILLECTOMY  age 63  . VAGINAL HYSTERECTOMY  late 20's    There were no vitals filed for this visit.   Subjective Assessment - 12/14/20 1017    Subjective She denies any pain currently. She has been able to resume  her normal cleaning with occasional low back pain,but it is tolerable. She feels she is better than she was. She feels that she is independent with her home program to further progress. She has not used her SPC in a few weeks and hasn't needed the heating pad.    How long can you sit comfortably? as long as I want    How long can you stand comfortably? "I get some pain after I stand for awhile"    How long can you walk comfortably? "I can do my shopping without pain"    Currently in Pain? No/denies              [facility] PT Assessment - 12/14/20 0001      Observation/Other Assessments   Focus on Therapeutic Outcomes (FOTO)  63% function      Posture/Postural Control   Posture Comments symmetirca pelvic aligment; slight Lt lateral shift      ROM / Strength   AROM / PROM / Strength Strength      AROM   Overall AROM Comments Lumbar AROM WFl and pain free      Strength   Overall Strength Comments gross LE strength 5/5 and pain free  [place] Adult PT Treatment/Exercise - 12/14/20 0001      Self-Care   Other Self-Care Comments  see patient education      Lumbar Exercises: Standing   Other Standing Lumbar Exercises hip extension 2 x 10, mini squats 2 x 10    Other Standing Lumbar Exercises repeated extension in standing 1 x 10      Lumbar Exercises: Seated   Other Seated Lumbar Exercises march with green TB 2 x 10      Lumbar Exercises: Sidelying   Hip Abduction Limitations 2 x 10                  PT Education - 12/14/20 1052    Education Details Discharge education. Anatomy of current condition. Updated HEP. FOTO results and overall functional progress.    Person(s) Educated Patient    Methods Explanation;Demonstration;Handout    Comprehension Verbalized understanding;Returned demonstration            PT Short Term Goals - 12/14/20 1025      PT SHORT TERM GOAL #1   Title pt will be able to stand upright and walk short  distances pain <=3/10    Baseline immediate pain upon standing at eval, 8/10 reported; no pain with standing 12/07/20    Time 2    Period Weeks    Status Achieved    Target Date 11/19/20             PT Long Term Goals - 12/14/20 1026      PT LONG TERM GOAL #1   Title Pt will be able to return to cleaning her house independently    Baseline unable due to pain at eval    Time 6    Period Weeks    Status Achieved      PT LONG TERM GOAL #2   Title gross hip strength to 5/5 to provide lumbopelvic support    Baseline not appropriate to test at eval due to pain    Time 6    Period Weeks    Status Achieved      PT LONG TERM GOAL #3   Title will be able to bend and lift with proper form and without incr in pain    Baseline unable due to severe pain at eval    Time 6    Period Weeks    Status Achieved      PT LONG TERM GOAL #4   Title pt will be able to ambulate independently and confidently without AD    Baseline relying on [facility] at eval    Time 6    Period Weeks    Status Achieved                 Plan - 12/14/20 1027    Clinical Impression Statement Patient has progressed well since her start of care having met all established short term and long term functional goals. Her pain levels have much reduced since the start of care with ability to ambulate without pain or need for assistive device and she has returned to her daily activities with occasional minimal low back pain. She is appropriate for discharge at this time to advanced home program.    Personal Factors and Comorbidities Comorbidity 1    PT Treatment/Interventions ADLs/Self Care Home Management;Cryotherapy;Moist Heat;Electrical Stimulation;Gait training;Functional mobility training;Neuromuscular re-education;Therapeutic exercise;Therapeutic activities;Patient/family education;Manual techniques;Passive range of motion;Dry needling;Taping    PT Next Visit Plan --    PT Home Exercise Plan  Access Code [email or web address]     Consulted and Agree with Plan of Care Patient           Patient will benefit from skilled therapeutic intervention in order to improve the following deficits and impairments:  Abnormal gait,Difficulty walking,Increased muscle spasms,Improper body mechanics,Decreased activity tolerance,Pain,Postural dysfunction  Visit Diagnosis: Pain in left hip  Abnormal posture     Problem List Patient Active Problem List   Diagnosis Date Noted  . COPD exacerbation ([place]) 09/14/2020  . Aortic atherosclerosis ([place]) 06/03/2018  . Atherosclerosis of right coronary artery 06/03/2018  . Essential hypertension 12/31/2016  . MGUS (monoclonal gammopathy of unknown significance) 10/30/2016  . Senile purpura ([place]) 08/31/2015  . COPD (chronic obstructive pulmonary disease) ([place]) 02/24/2015  . Osteopenia 05/12/2012  . Vitamin D deficiency 04/28/2012  . Pure hypercholesterolemia 04/28/2012  . Former smoker 04/03/2012   PHYSICAL THERAPY DISCHARGE SUMMARY  Visits from Start of Care: 7 Current functional level related to goals / functional outcomes: See above    Remaining deficits: See above   Education / Equipment: See patient education   Plan: Patient agrees to discharge.  Patient goals were met. Patient is being discharged due to meeting the stated rehab goals.  ?????       Gwendolyn Grant, PT, DPT, ATC 12/14/20 12:04 PM  [place] [facility] [address] [place], [place], [address] Phone: [phone]   Fax:  [phone]  Name: Tiffany [name] MRN: [id number] Date of Birth: [birth year]

## 2020-12-22 ENCOUNTER — Other Ambulatory Visit: Payer: Self-pay | Admitting: *Deleted

## 2020-12-22 NOTE — Patient Outreach (Signed)
[facility]) Care Management  12/22/2020  Loisann Roach Jenkinson [birth year] [phone]  Unsuccessful outreach attempt made to patient. RN Health Coach left HIPAA compliant voicemail message along with her contact information.  Plan: RN Health Coach will call patient within the month of February.  Emelia Loron RN, BSN [place] [phone] [email or web address]

## 2021-01-06 ENCOUNTER — Ambulatory Visit: Payer: Self-pay | Admitting: *Deleted

## 2021-01-09 ENCOUNTER — Ambulatory Visit: Payer: Self-pay | Admitting: *Deleted

## 2021-01-10 ENCOUNTER — Other Ambulatory Visit: Payer: Self-pay

## 2021-01-10 ENCOUNTER — Ambulatory Visit: Payer: Medicare Other | Admitting: Pulmonary Disease

## 2021-01-10 ENCOUNTER — Encounter: Payer: Self-pay | Admitting: Pulmonary Disease

## 2021-01-10 VITALS — BP 112/76 | HR 67 | Temp 97.5°F | Ht 67.0 in | Wt 161.0 lb

## 2021-01-10 DIAGNOSIS — J449 Chronic obstructive pulmonary disease, unspecified: Secondary | ICD-10-CM

## 2021-01-10 NOTE — Progress Notes (Signed)
Subjective:   [name] GENDER: female DOB: 11-05-1946, MRN: [id number]   HPI  Chief Complaint  Patient presents with  . Follow-up    Sob, wheezing, and occasional cough still the same since last visit. Stiolto and albuterol helping.    Tiffany [name] is 75 year old female former smoker with severe COPD who presents for follow-up.  Since our last visit, she has been compliant with Stioloto. Still has shortness of breath when walking up stairs for her laundry in the baseline. Denies cough and wheezing. She may use her albuterol up to twice a day for activity, which works very well. She has not been as active due to the cold weather. She has completed hip exercises with PT but this did not involve very much cardio. She did have recent COPD exacerbations one treated as outpatient in September and an inpatient hospital stay in October last year requiring antibiotics and steroids.    Social History: Former smoker.  Quit 08/2018.  50 pack years  I have personally reviewed patient's past medical/family/social history/allergies/current medications.  Past Medical History:  Diagnosis Date  . Abnormal PFT 09/02/14   moderate restrictive ventilatory defect.  Marland Kitchen BCC (basal cell carcinoma), face 2010   Dr. Allyson Sabal  . Colon polyp 2/05  . Diverticulosis 11/2010   seen on colonoscopy and air contrast BE  . Emphysema of lung ([place])   . Hyperlipidemia   . Hypertension    previously treated with meds, resolved  . MGUS (monoclonal gammopathy of unknown significance) 1999   prevously under care of Dr. Learta Codding  . Osteopenia    DEXA 01/31/2010 at [facility]; 2014 at [facility]  . 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 Tobacco abuse     No Known Allergies   Outpatient Medications Prior to Visit  Medication Sig Dispense Refill  . ergocalciferol (VITAMIN D2) 1.25 MG (50000 UT) capsule Take 1 capsule (50,000 Units total) by mouth once a week. 12 capsule 0   . losartan (COZAAR) 50 MG tablet Take 1 tablet (50 mg total) by mouth daily. 90 tablet 1  . PROVENTIL HFA 108 (90 Base) MCG/ACT inhaler INHALE TWO PUFFS BY MOUTH EVERY 4 HOURS 6.7 g 5  . simvastatin (ZOCOR) 20 MG tablet Take 1 tablet (20 mg total) by mouth daily. 90 tablet 1  . Tiotropium Bromide-Olodaterol (STIOLTO RESPIMAT) 2.5-2.5 MCG/ACT AERS Inhale 2 puffs into the lungs daily. 4 g 11  . meloxicam (MOBIC) 15 MG tablet Take 1 tablet (15 mg total) by mouth daily. (Patient not taking: Reported on 01/10/2021) 15 tablet 0   No facility-administered medications prior to visit.    Review of Systems  Constitutional: Negative for chills, diaphoresis, fever, malaise/fatigue and weight loss.  HENT: Negative for congestion.   Respiratory: Positive for shortness of breath. Negative for cough, hemoptysis, sputum production and wheezing.   Cardiovascular: Negative for chest pain, palpitations and leg swelling.   Objective:   Vitals:   01/10/21 1513  BP: 112/76  Pulse: 67  Temp: (!) 97.5 F (36.4 C)  SpO2: 96%  Weight: 161 lb (73 kg)  Height: 5\' 7"  (1.702 m)   SpO2: 96 % O2 Device: None (Room air)   Physical Exam: General: Well-appearing, no acute distress HENT: [place], AT Eyes: EOMI, no scleral icterus Respiratory: Diminished breath sounds bilaterally.  No crackles, wheezing or rales Cardiovascular: RRR, -M/R/G, no JVD Extremities:-Edema,-tenderness Neuro: AAO x4, CNII-XII grossly intact Psych: Normal  mood, normal affect  Chest imaging: CXR 09/10/2018- hyperinflated lung fields.  No acute infiltrate/effusion or edema CT Chest Lung 01/12/19 - Emphysema, unchanged pulmonary nodules CT Chest Lung Screen 01/26/20 - Unchanged pulmonary nodules, larges 3.39mm in RML. Background emphysema  PFT: 12/02/2017-severe obstructive defect. Ratio 58 FEV1 37 FVC 48  Imaging, labs and test noted above have been reviewed independently by me.    Assessment & Plan:  75 year old female with severe COPD  who presents for follow-up. She has had one outpatient and one inpatient COPD exacerbation in September and October 2021, respectively. Feels her symptoms are currently well-controlled and worsens in the winter time. Discussed action plan at next visit including stepping up to inhaler with ICS  Severe COPD, GOLD Class B - fair control. Recent exacerbations --CONTINUE Stiolto Respimat TWO puffs ONCE a day --CONTINUE Albuterol as needed  --Consider adding ICS before next winter for symptom management --CT Lung Screen scheduled for 01/2021 --Discuss Pulmonary Rehab again at next visit  Health Maintenance Immunization History  Administered Date(s) Administered  . Fluad Quad(high Dose 65+) 09/15/2019, 08/08/2020  . Influenza Split 10/16/2012  . Influenza, High Dose Seasonal PF 09/02/2013, 08/25/2014, 08/31/2015, 11/01/2016, 09/09/2017, 09/09/2018  . PFIZER(Purple Top)SARS-COV-2 Vaccination 07/01/2020, 07/22/2020  . Pneumococcal Conjugate-13 08/25/2014  . Pneumococcal Polysaccharide-23 12/12/2009, 08/31/2015  . Tdap 12/12/2009   CT Lung Screen - Due 01/2021  No orders of the defined types were placed in this encounter.  No orders of the defined types were placed in this encounter.   Return in about 6 months (around 07/10/2021).   I have spent a total time of 31-minutes on the day of the appointment reviewing prior documentation, coordinating care and discussing medical diagnosis and plan with the patient/family. Imaging, labs and tests included in this note have been reviewed and interpreted independently by me.   Rodman Pickle, MD [facility] 01/10/2021 3:35 PM

## 2021-01-10 NOTE — Patient Instructions (Signed)
Severe COPD, GOLD Class B --CONTINUE Stiolto Respimat TWO puffs ONCE a day --CONTINUE Albuterol as needed  --Consider adding ICS before next winter for symptom management --CT Lung Screen scheduled for 01/2021  Follow-up with me in 6 months

## 2021-01-13 ENCOUNTER — Ambulatory Visit: Payer: Self-pay | Admitting: *Deleted

## 2021-01-18 ENCOUNTER — Telehealth: Payer: Self-pay | Admitting: Pulmonary Disease

## 2021-01-18 DIAGNOSIS — J449 Chronic obstructive pulmonary disease, unspecified: Secondary | ICD-10-CM

## 2021-01-18 MED ORDER — PROVENTIL HFA 108 (90 BASE) MCG/ACT IN AERS: INHALATION_SPRAY | RESPIRATORY_TRACT | 5 refills | Status: DC

## 2021-01-18 NOTE — Telephone Encounter (Signed)
Spoke with patient. She stated that she called for a refill on her on Proventil but was told her application for free medication had expired on January 17th. Offered to get the patient re-enrolled but she wanted to see how much it will cost at the pharmacy. She wishes to have a RX sent to [facility] on [address]. RX has been sent in for her.   Nothing further needed at time of call.

## 2021-01-23 ENCOUNTER — Telehealth: Payer: Self-pay | Admitting: Pulmonary Disease

## 2021-01-23 NOTE — Telephone Encounter (Signed)
Spoke with the pt  She states that the proventil hfa rx was not covered by her insurance I called [facility] and was advised that this is b/c insurance prefers the generic  I advised okay for generic albuterol hfa  Spoke with pt and she is aware  Nothing further needed

## 2021-01-24 ENCOUNTER — Other Ambulatory Visit: Payer: Self-pay | Admitting: *Deleted

## 2021-01-24 NOTE — Patient Instructions (Signed)
Goals Addressed            This Visit's Progress   . (THN)Manage Fatigue (Tiredness)   On track    Timeframe:  Long-Range Goal Priority:  Medium Start Date: [id number]                            Expected End Date:       [id number]               Follow Up Date [id number]   - eat healthy - limit daytime naps Balance activity during the day with rest periods     Why is this important?   Feeling tired or worn out is a common symptom of COPD (chronic obstructive pulmonary disease).  Learning when you feel your best and when you need rest is important.  Managing the tiredness (fatigue) will help you be active and enjoy life.     Notes:     . (THN)Track and Manage My Triggers   On track    Follow Up Date [id number] Timeframe:  Long-Range Goal Priority:  Medium Start Date:   09/20/20                          Expected End Date:  [id number]   - limit outdoor activity during cold weather    Why is this important?   Triggers are activities or things, like tobacco smoke or cold weather, that make your COPD (chronic obstructive pulmonary disease) flare-up.  Knowing these triggers helps you plan how to stay away from them.  When you cannot remove them, you can learn how to manage them.     Notes:     . COMPLETED: THN Make and Keep All Appointments       Follow Up Date 12/26/20 Timeframe:  Long-Range Goal Priority:  Medium Start Date:    09/20/20                         Expected End Date:  [id number]                   - keep a calendar with appointment dates  Will schedule and keep all appointments     Why is this important?   Part of staying healthy is seeing the doctor for follow-up care.  If you forget your appointments, there are some things you can do to stay on track.    Notes:      . Track and Manage My Symptoms   On track    Follow Up Date 12/26/20 Timeframe:  Long-Range Goal Priority:  High Start Date:    09/20/20                         Expected End Date:  12/26/20                        - follow rescue plan if symptoms flare-up - keep follow-up appointments     Why is this important?   Tracking your symptoms and other information about your health helps your doctor plan your care.  Write down the symptoms, the time of day, what you were doing and what medicine you are taking.  You will soon learn how to manage your symptoms.     Notes:

## 2021-01-24 NOTE — Patient Outreach (Signed)
[facility]) Care Management  [place]  01/24/2021   Zandrea Kenealy Bango May 28, 1946 [phone]  RN Health Coach telephone call to patient.  Hip[employer] compliance verified. Per patient she is doing good. She is using inhalers as directed.  Patient stated that the assistance program is not covering Proventil. She is working with Dr office to substitute. Patient appetite is good. Per patient she has finished physical therapy and is doing some of the exercises at intervals. Patient has agreed to further outreach calls. Encounter Medications:  Outpatient Encounter Medications as of 01/24/2021  Medication Sig Note  . ergocalciferol (VITAMIN D2) 1.25 MG (50000 UT) capsule Take 1 capsule (50,000 Units total) by mouth once a week. 09/14/2020: Fridays  . losartan (COZAAR) 50 MG tablet Take 1 tablet (50 mg total) by mouth daily.   . meloxicam (MOBIC) 15 MG tablet Take 1 tablet (15 mg total) by mouth daily. (Patient not taking: Reported on 01/10/2021)   . PROVENTIL HFA 108 (90 Base) MCG/ACT inhaler INHALE TWO PUFFS BY MOUTH EVERY 4 HOURS   . simvastatin (ZOCOR) 20 MG tablet Take 1 tablet (20 mg total) by mouth daily.   . Tiotropium Bromide-Olodaterol (STIOLTO RESPIMAT) 2.5-2.5 MCG/ACT AERS Inhale 2 puffs into the lungs daily.    No facility-administered encounter medications on file as of 01/24/2021.    Functional Status:  In your present state of health, do you have any difficulty performing the following activities: 09/20/2020 09/15/2020  Hearing? N N  Vision? N N  Difficulty concentrating or making decisions? N N  Comment - -  Walking or climbing stairs? N N  Comment gets winded walkiing steps -  Dressing or bathing? N N  Doing errands, shopping? N Y  Conservation officer, nature and eating ? N -  Using the Toilet? N -  In the past six months, have you accidently leaked urine? N -  Do you have problems with loss of bowel control? N -  Managing your Medications? N -  Managing your  Finances? N -  Housekeeping or managing your Housekeeping? N -  Some recent data might be hidden    Fall/Depression Screening: Fall Risk  09/20/2020 08/18/2020 08/08/2020  Falls in the past year? 0 0 0  Number falls in past yr: 0 1 -  Injury with Fall? 0 0 -  Risk for fall due to : No Fall Risks History of fall(s) -  Follow up - Falls evaluation completed -   PHQ 2/9 Scores 01/24/2021 09/20/2020 08/08/2020 02/04/2020 10/16/2019 09/17/2019 12/03/2018  PHQ - 2 Score 0 0 0 0 0 0 0    Assessment:  Goals Addressed            This Visit's Progress   . (THN)Manage Fatigue (Tiredness)   On track    Timeframe:  Long-Range Goal Priority:  Medium Start Date: [id number]                            Expected End Date:       [id number]               Follow Up Date [id number]   - eat healthy - limit daytime naps Balance activity during the day with rest periods     Why is this important?   Feeling tired or worn out is a common symptom of COPD (chronic obstructive pulmonary disease).  Learning when you feel your best and when you need rest is  important.  Managing the tiredness (fatigue) will help you be active and enjoy life.     Notes:     . (THN)Track and Manage My Triggers   On track    Follow Up Date [id number] Timeframe:  Long-Range Goal Priority:  Medium Start Date:   09/20/20                          Expected End Date:  [id number]   - limit outdoor activity during cold weather    Why is this important?   Triggers are activities or things, like tobacco smoke or cold weather, that make your COPD (chronic obstructive pulmonary disease) flare-up.  Knowing these triggers helps you plan how to stay away from them.  When you cannot remove them, you can learn how to manage them.     Notes:     . COMPLETED: THN Make and Keep All Appointments       Follow Up Date 12/26/20 Timeframe:  Long-Range Goal Priority:  Medium Start Date:    09/20/20                         Expected End Date:  [id number]                    - keep a calendar with appointment dates  Will schedule and keep all appointments     Why is this important?   Part of staying healthy is seeing the doctor for follow-up care.  If you forget your appointments, there are some things you can do to stay on track.    Notes:      . Track and Manage My Symptoms   On track    Follow Up Date 12/26/20 Timeframe:  Long-Range Goal Priority:  High Start Date:    09/20/20                         Expected End Date:  12/26/20                       - follow rescue plan if symptoms flare-up - keep follow-up appointments     Why is this important?   Tracking your symptoms and other information about your health helps your doctor plan your care.  Write down the symptoms, the time of day, what you were doing and what medicine you are taking.  You will soon learn how to manage your symptoms.     Notes:        Plan:  Follow-up:  Patient agrees to Care Plan and Follow-up. Provided a calendar book Assisted patient in ordering free COVID kits Patient will keep appointments as scheduled RN will follow up within the month of August RN sent barrier and assessment letter to PCP  [place] Management [phone]

## 2021-02-01 ENCOUNTER — Telehealth: Payer: Self-pay | Admitting: Pulmonary Disease

## 2021-02-01 NOTE — Telephone Encounter (Signed)
Called and spoke with Patient. Patient was calling to schedule lung screening CT. Advised [name] with Judson Roch, NP schedules and goes over any information she may need. Advised [name], RN will call to review and schedule ordered CT.  Message routed to [place], [place]

## 2021-02-02 NOTE — Telephone Encounter (Signed)
I spoke with Tiffany [name] on 3/9 and her LCS CT appt has been scheduled on 02/14/21 @ 10:30am with [facility] and she is aware

## 2021-02-02 NOTE — Telephone Encounter (Signed)
Will forward to [facility]) to contact pt regarding CT.

## 2021-02-14 ENCOUNTER — Ambulatory Visit (INDEPENDENT_AMBULATORY_CARE_PROVIDER_SITE_OTHER)
Admission: RE | Admit: 2021-02-14 | Discharge: 2021-02-14 | Disposition: A | Discharge status: Home / Self Care | Payer: Medicare Other | Source: Ambulatory Visit | Attending: Cardiology | Admitting: Cardiology

## 2021-02-14 ENCOUNTER — Other Ambulatory Visit: Payer: Self-pay

## 2021-02-14 DIAGNOSIS — Z87891 Personal history of nicotine dependence: Secondary | ICD-10-CM | POA: Diagnosis not present

## 2021-02-27 ENCOUNTER — Telehealth: Payer: Self-pay | Admitting: Acute Care

## 2021-02-27 DIAGNOSIS — Z87891 Personal history of nicotine dependence: Secondary | ICD-10-CM

## 2021-02-27 NOTE — Telephone Encounter (Signed)
Pt informed of CT results per Sarah Groce, NP.  PT verbalized understanding.  Copy sent to PCP.  Order placed for 1 yr f/u CT.  

## 2021-02-27 NOTE — Telephone Encounter (Signed)
Left message for pt to call back regarding Lung Screening CT results.

## 2021-03-01 ENCOUNTER — Encounter: Payer: Self-pay | Admitting: *Deleted

## 2021-03-07 NOTE — Patient Instructions (Addendum)
Tiffany [name] , Thank you for taking time to come for your Medicare Wellness Visit. I appreciate your ongoing commitment to your health goals. Please review the following plan we discussed and let me know if I can assist you in the future.    This is a list of the screening recommended for you and due dates:  Health Maintenance  Topic Date Due  . Tetanus Vaccine  12/13/2019  . COVID-19 Vaccine (3 - Pfizer risk 4-dose series) 08/19/2020  . Colon Cancer Screening  11/28/2020  . Flu Shot  06/26/2021  . Mammogram  04/13/2021  . DEXA scan (bone density measurement)  Completed  .  Hepatitis C: One time screening is recommended by Center for Disease Control  (CDC) for  adults born from [birth year] through 1964.   Completed  . Pneumonia vaccines  Completed  . HPV Vaccine  Aged Out   A COVID booster is highly recommended, given your risk factors for complications if you were to get COVID, and that the protection from your initial vaccines is wearing off and not as good with covering the variants.   I encourage you to get this as soon as possible (from our office or from a pharmacy).  Please get TdaP (tetanus booster) vaccine from pharmacy. This needs to be at least 2 weeks from any other vaccine (ie if you get your COVID vaccine, wait 2 weeks before the tetanus).  I recommend getting the new shingles vaccine (Shingrix). Since you have Medicare, you will need to get this from the pharmacy, as it is covered by Part D. This is a series of 2 injections, spaced 2 months apart. You should wait at least 2 weeks between any vaccines.  You declined all screening options for colon cancer.  If you change your mind, and would like them, let us know (we discussed Cologuard, FIT testing (yearly stool sample) and colonoscopy).  Continue yearly mammograms (due in May)  Yearly eye exams are recommended.  Please complete Living Will and Healthcare power of attorney forms, and get [place] copies once notarized to be scanned into  your chart.  I gave you another set so that you and your husband can both do this.  Try and use sugar-free hard candies in the future, and limit your portions of carbs (due to pre-diabetes--see more information below).  You need to get more calcium in your diet, and if not getting enough through diet, start taking a separate calcium pill to make up the difference. Depending on how much you get in your diet, you may need 1 pill daily, versus twice daily (versus none if you get adequate amount from your diet).  You DO need to take a vitamin D3 supplement every day.  We will check levels now to see if it is low enough to need another prescription before starting over-the-counter vitamin D.  I think you should buy a 5000 IU D3 the next time you're at the store so that you have them on hand (whether it is to start now versus starting after another course of prescription).  You can take the Vitamin D3 and calcium together along with your losartan if that makes it easier for you.    Calcium Content in Foods Calcium is the most abundant mineral in the body. Most of the body's calcium supply is stored in bones and teeth. Calcium helps many parts of the body function normally, including:  Blood and blood vessels.  Nerves.  Hormones.  Muscles.  Bones and  teeth. When your calcium stores are low, you may be at risk for low bone mass, bone loss, and broken bones (fractures). When you get enough calcium, it helps to support strong bones and teeth throughout your life. Calcium is especially important for:  Children during growth spurts.  Girls during adolescence.  Women who are pregnant or breastfeeding.  Women after their menstrual cycle stops (postmenopause).  Women whose menstrual cycle has stopped due to anorexia nervosa or regular intense exercise.  People who cannot eat or digest dairy products.  Vegans. Recommended daily amounts of calcium:  Women (ages 91 to 10): 1,000 mg per  day.  Women (ages 28 and older): 1,200 mg per day.  Men (ages 34 to 65): 1,000 mg per day.  Men (ages 13 and older): 1,200 mg per day.  Women (ages 52 to 1): 1,300 mg per day.  Men (ages 10 to 44): 1,300 mg per day. General information  Eat foods that are high in calcium. Try to get most of your calcium from food.  Some people may benefit from taking calcium supplements. Check with your health care provider or diet and nutrition specialist (dietitian) before starting any calcium supplements. Calcium supplements may interact with certain medicines. Too much calcium may cause other health problems, such as constipation and kidney stones.  For the body to absorb calcium, it needs vitamin D. Sources of vitamin D include: ? Skin exposure to direct sunlight. ? Foods, such as egg yolks, liver, mushrooms, saltwater fish, and fortified milk. ? Vitamin D supplements. Check with your health care provider or dietitian before starting any vitamin D supplements. What foods are high in calcium? Foods that are high in calcium contain more than 100 milligrams per serving. Fruits  Fortified orange juice or other fruit juice, 300 mg per 8 oz serving. Vegetables  Collard greens, 360 mg per 8 oz serving.  Kale, 100 mg per 8 oz serving.  Bok choy, 160 mg per 8 oz serving. Grains  Fortified ready-to-eat cereals, 100 to 1,000 mg per 8 oz serving.  Fortified frozen waffles, 200 mg in 2 waffles.  Oatmeal, 140 mg in 1 cup. Meats and other proteins  Sardines, canned with bones, 325 mg per 3 oz serving.  Salmon, canned with bones, 180 mg per 3 oz serving.  Canned shrimp, 125 mg per 3 oz serving.  Baked beans, 160 mg per 4 oz serving.  Tofu, firm, made with calcium sulfate, 253 mg per 4 oz serving. Dairy  Yogurt, plain, low-fat, 310 mg per 6 oz serving.  Nonfat milk, 300 mg per 8 oz serving.  American cheese, 195 mg per 1 oz serving.  Cheddar cheese, 205 mg per 1 oz serving.  Cottage  cheese 2%, 105 mg per 4 oz serving.  Fortified soy, rice, or almond milk, 300 mg per 8 oz serving.  Mozzarella, part skim, 210 mg per 1 oz serving. The items listed above may not be a complete list of foods high in calcium. Actual amounts of calcium may be different depending on processing. Contact a dietitian for more information.   What foods are lower in calcium? Foods that are lower in calcium contain 50 mg or less per serving. Fruits  Apple, about 6 mg.  Banana, about 12 mg. Vegetables  Lettuce, 19 mg per 2 oz serving.  Tomato, about 11 mg. Grains  Rice, 4 mg per 6 oz serving.  Boiled potatoes, 14 mg per 8 oz serving.  White bread, 6 mg per slice. Meats  and other proteins  Egg, 27 mg per 2 oz serving.  Red meat, 7 mg per 4 oz serving.  Chicken, 17 mg per 4 oz serving.  Fish, cod, or trout, 20 mg per 4 oz serving. Dairy  Cream cheese, regular, 14 mg per 1 Tbsp serving.  Brie cheese, 50 mg per 1 oz serving.  Parmesan cheese, 70 mg per 1 Tbsp serving. The items listed above may not be a complete list of foods lower in calcium. Actual amounts of calcium may be different depending on processing. Contact a dietitian for more information. Summary  Calcium is an important mineral in the body because it affects many functions. Getting enough calcium helps support strong bones and teeth throughout your life.  Try to get most of your calcium from food.  Calcium supplements may interact with certain medicines. Check with your health care provider or dietitian before starting any calcium supplements. This information is not intended to replace advice given to you by your health care provider. Make sure you discuss any questions you have with your health care provider. Document Revised: 03/09/2020 Document Reviewed: 03/09/2020 Elsevier Patient Education  2021 [place].   Prediabetes Eating Plan Prediabetes is a condition that causes blood sugar (glucose) levels to  be higher than normal. This increases the risk for developing type 2 diabetes (type 2 diabetes mellitus). Working with a health care provider or nutrition specialist (dietitian) to make diet and lifestyle changes can help prevent the onset of diabetes. These changes may help you:  Control your blood glucose levels.  Improve your cholesterol levels.  Manage your blood pressure. What are tips for following this plan? Reading food labels  Read food labels to check the amount of fat, salt (sodium), and sugar in prepackaged foods. Avoid foods that have: ? Saturated fats. ? Trans fats. ? Added sugars.  Avoid foods that have more than 300 milligrams (mg) of sodium per serving. Limit your sodium intake to less than 2,300 mg each day. Shopping  Avoid buying pre-made and processed foods.  Avoid buying drinks with added sugar. Cooking  Cook with olive oil. Do not use butter, lard, or ghee.  Bake, broil, grill, steam, or boil foods. Avoid frying. Meal planning  Work with your dietitian to create an eating plan that is right for you. This may include tracking how many calories you take in each day. Use a food diary, notebook, or mobile application to track what you eat at each meal.  Consider following a Mediterranean diet. This includes: ? Eating several servings of fresh fruits and vegetables each day. ? Eating fish at least twice a week. ? Eating one serving each day of whole grains, beans, nuts, and seeds. ? Using olive oil instead of other fats. ? Limiting alcohol. ? Limiting red meat. ? Using nonfat or low-fat dairy products.  Consider following a plant-based diet. This includes dietary choices that focus on eating mostly vegetables and fruit, grains, beans, nuts, and seeds.  If you have high blood pressure, you may need to limit your sodium intake or follow a diet such as the DASH (Dietary Approaches to Stop Hypertension) eating plan. The DASH diet aims to lower high blood  pressure.   Lifestyle  Set weight loss goals with help from your health care team. It is recommended that most people with prediabetes lose 7% of their body weight.  Exercise for at least 30 minutes 5 or more days a week.  Attend a support group or seek support  from a mental health counselor.  Take over-the-counter and prescription medicines only as told by your health care provider. What foods are recommended? Fruits Berries. Bananas. Apples. Oranges. Grapes. Papaya. Mango. Pomegranate. Kiwi. Grapefruit. Cherries. Vegetables Lettuce. Spinach. Peas. Beets. Cauliflower. Cabbage. Broccoli. Carrots. Tomatoes. Squash. Eggplant. Herbs. Peppers. Onions. Cucumbers. Brussels sprouts. Grains Whole grains, such as whole-wheat or whole-grain breads, crackers, cereals, and pasta. Unsweetened oatmeal. Bulgur. Barley. Quinoa. Brown rice. Corn or whole-wheat flour tortillas or taco shells. Meats and other proteins Seafood. Poultry without skin. Lean cuts of pork and beef. Tofu. Eggs. Nuts. Beans. Dairy Low-fat or fat-free dairy products, such as yogurt, cottage cheese, and cheese. Beverages Water. Tea. Coffee. Sugar-free or diet soda. Seltzer water. Low-fat or nonfat milk. Milk alternatives, such as soy or almond milk. Fats and oils Olive oil. Canola oil. Sunflower oil. Grapeseed oil. Avocado. Walnuts. Sweets and desserts Sugar-free or low-fat pudding. Sugar-free or low-fat ice cream and other frozen treats. Seasonings and condiments Herbs. Sodium-free spices. Mustard. Relish. Low-salt, low-sugar ketchup. Low-salt, low-sugar barbecue sauce. Low-fat or fat-free mayonnaise. The items listed above may not be a complete list of recommended foods and beverages. Contact a dietitian for more information. What foods are not recommended? Fruits Fruits canned with syrup. Vegetables Canned vegetables. Frozen vegetables with butter or cream sauce. Grains Refined white flour and flour products, such as  bread, pasta, snack foods, and cereals. Meats and other proteins Fatty cuts of meat. Poultry with skin. Breaded or fried meat. Processed meats. Dairy Full-fat yogurt, cheese, or milk. Beverages Sweetened drinks, such as iced tea and soda. Fats and oils Butter. Lard. Ghee. Sweets and desserts Baked goods, such as cake, cupcakes, pastries, cookies, and cheesecake. Seasonings and condiments Spice mixes with added salt. Ketchup. Barbecue sauce. Mayonnaise. The items listed above may not be a complete list of foods and beverages that are not recommended. Contact a dietitian for more information. Where to find more information  American Diabetes Association: www.diabetes.org Summary  You may need to make diet and lifestyle changes to help prevent the onset of diabetes. These changes can help you control blood sugar, improve cholesterol levels, and manage blood pressure.  Set weight loss goals with help from your health care team. It is recommended that most people with prediabetes lose 7% of their body weight.  Consider following a Mediterranean diet. This includes eating plenty of fresh fruits and vegetables, whole grains, beans, nuts, seeds, fish, and low-fat dairy, and using olive oil instead of other fats. This information is not intended to replace advice given to you by your health care provider. Make sure you discuss any questions you have with your health care provider. Document Revised: 02/11/2020 Document Reviewed: 02/11/2020 Elsevier Patient Education  [place].

## 2021-03-07 NOTE — Progress Notes (Signed)
Chief Complaint  Patient presents with  . Medicare Wellness    Fasting (coffee with cream) AWV today. Was seen in Dec for back pain, did PT-helped a lot but still bothering her. Has not gotten Tdap or Shingrix vaccines yet. Has not contacted [facility] either. Has had 2 covid booster yet-unsure if she wants today.     Tiffany [name] is a 75 y.o. female who presents for Medicare wellness visit and follow-up on chronic medical conditions.  We cannot do CPE today as well, as we have done in the past, as [employer] will not pay for these to be done on the same day. She is scheduled for CPE 4/27  She was seen in 09/2020 with L lateral hip pain.  She got PT in December and January.  This helped, no longer having much hip pain.  She does still have some pain in the low back on the left side, only "every once in a while", not daily, and not severe.  She does home exercises "every once in a while", not daily.    Hypertension. Sheis compliant with takinglosartan 50mg . She denies side effects,headaches, chest pain, palpitations, edema, shortness of breath (other than from COPD).  Hasn't checked home BP in quite a while, not high when checked at other doctor visits (was high at last visit here, an acute visit with pain).  BP Readings from Last 3 Encounters:  01/10/21 112/76  10/24/20 (!) 150/80  09/26/20 122/76   Hyperlipidemia: She is compliant with taking simvastatin. Tries to follow lowfat, low cholesterol diet. She was noted to have aortic atherosclerosis on CT for lung cancer screening, and calcified atherosclerotic plaque in R coronary artery. Carotid US in 06/2019 didn't show any hemodynamically significant stenosis.  She denies any chest pain (just chronic DOE, improved with treatment for COPD), denies neurologic symptoms. Lipids were at goal on last check: Lab Results  Component Value Date   CHOL 164 08/08/2020   HDL 57 08/08/2020   LDLCALC 88 08/08/2020   TRIG 104 08/08/2020   CHOLHDL 2.9  08/08/2020   Osteopenia--She previously took bisphosphonates (changed from fosamax (due to nausea) to Actonel, as well as Boniva, both of which she tolerated but stopped due to cost issues, possibly some GI side effects(heartburn--she stopped without contacting us)).She hasn't taken any calcium in a while, and no vitamin D since finishing the prescription. She doesn't get any regular weight-bearing exercise.She quit smoking10/2019. DEXA was rechecked 03/2020, and was stable (T-2.1 at left fem neck). Encouraged adequate Ca, D and weight-bearing exercise, and repeat in 2 years.  Vitamin D deficiency:  In September 2021 her vitamin D was low at 23.3, when she hadn't been taking her vitamin regularly. She was prescribed 12 weeks of once weekly 50,000 IU, and had been advised to restart taking over-the-counter Vitamin D3 5000 IU once daily upon completion of the prescription. She hasn't taken any vitamin D since completing the prescription.  Tobacco useand COPD--Shequit smoking 09/10/18. Breathing has improved, still has some DOE walking up hill with groceries. She remains under the care of pulmonary, last seen 12/2020.  No changes to regimen were made--continues on Stiolto, and uses albuterol prn. She was hospitalized in October 2021 for COPD exacerbation. Pulmonary mentioned possibly starting ICS before winter. She gets yearly lung cancer screening CT's, last 01/2021, stable.  Atherosclerosis:  She is compliant with her statin. She no longer takes any aspirin (bothered her stomach).  H/o elevated TSH--TSH of 4.75 in12/2017,with normal free T4; recheck in [id number]  stable(4.44).TSH was subsequently normal.She deniesany changes in hair/skin/bowels/energy/temperatureintolerance.Notes some hair loss, nothing new recently, just with age.  Lab Results  Component Value Date   TSH 3.700 12/03/2018   A1c was noted to be elevated in the hospital: Lab Results  Component Value Date   HGBA1C  5.9 (H) 09/15/2020  She admits to having a hard candy after a meal (instead of a cigarette).   Immunization History  Administered Date(s) Administered  . Fluad Quad(high Dose 65+) 09/15/2019, 08/08/2020  . Influenza Split 10/16/2012  . Influenza, High Dose Seasonal PF 09/02/2013, 08/25/2014, 08/31/2015, 11/01/2016, 09/09/2017, 09/09/2018  . PFIZER(Purple Top)SARS-COV-2 Vaccination 07/01/2020, 07/22/2020  . Pneumococcal Conjugate-13 08/25/2014  . Pneumococcal Polysaccharide-23 12/12/2009, 08/31/2015  . Tdap 12/12/2009   Last Pap smear: s/p hyst in 20's  Last mammogram:03/2020 Last colonoscopy: 11/2010 Dr. Wynetta Emery. She got a letter a couple of years ago stating she was due, but didn't go. Advised last year to contact [facility]. She never called, and reports she doesn't want a colonoscopy (unless she has symptoms). Last DEXA:03/2020 T-2.1 L femoral neck Dentist: doesn't go, has dentures  Ophtho: thinks she went last year Exercise: Not much, some home exercises from PT.    Other doctors caring for patient include: Dermatologist: Dr. Ledell Peoples PA GI: Dr. Wynetta Emery [facility]) Ophtho: no longer sees Dr. Wynetta Emery (moved); went to [facility] time ophtho (specialist): Dr. Edilia Bo Pulmonary: Dr. Loanne Drilling  Depression screen: negative Fall screen: negative Functional status survey: notable for occasional L knee pain, sometimes forgets a word here or there, no significant memory concerns. Mini-Cog screen normal. See full screens in Epic  End of Life Discussion: Patient does not havea living will and medical power of attorney. She was given paperwork in the past, hasn't returned it. She still has it at home.   PMH, PSH, SH and FH were reviewed and updated  Outpatient Encounter Medications as of 03/08/2021  Medication Sig Note  . losartan (COZAAR) 50 MG tablet Take 1 tablet (50 mg total) by mouth daily.   Marland Kitchen PROVENTIL HFA 108 (90 Base) MCG/ACT inhaler INHALE TWO PUFFS BY MOUTH EVERY 4  HOURS   . simvastatin (ZOCOR) 20 MG tablet Take 1 tablet (20 mg total) by mouth daily.   . Tiotropium Bromide-Olodaterol (STIOLTO RESPIMAT) 2.5-2.5 MCG/ACT AERS Inhale 2 puffs into the lungs daily.   . [DISCONTINUED] ergocalciferol (VITAMIN D2) 1.25 MG (50000 UT) capsule Take 1 capsule (50,000 Units total) by mouth once a week. 09/14/2020: Fridays  . [DISCONTINUED] meloxicam (MOBIC) 15 MG tablet Take 1 tablet (15 mg total) by mouth daily. (Patient not taking: Reported on 01/10/2021)    No facility-administered encounter medications on file as of 03/08/2021.   No Known Allergies   ROS: The patient denies anorexia, fever, headaches, vision changes, decreased hearing, ear pain, sore throat, breast concerns, chest pain, dizziness, syncope, swelling, nausea, vomiting, diarrhea, constipation, abdominal pain, melena, hematochezia, hematuria, incontinence, dysuria, vaginal bleeding, discharge, odor or itch, genital lesions, weakness, tremor, suspicious skin lesions, depression, anxiety, abnormal bleeding/bruising, or enlarged lymph nodes.  Occasional night sweats, no hot flashes during the day. Some vaginal dryness  Some intermittent itchy ears (ongoing/chronic, sometimes wakes her up at night); currently not itchy, did itch last night. SOB only with stairs and carrying groceries uphill, overall much improved. Some smoker's cough -- thick, clear phlegm. This is improved, less frequent since she quit smoking. Has heartburn with certain foods, such as pizza. Sometimes gets heartburn from simvastatin.  Tums prn helps. Some L knee pain,  mainly with stairs, denies other joint pains. Occasional L sided low back pain. No longer having hip pain.    PHYSICAL EXAM:  BP 140/80   Pulse 64   Ht 5\' 7"  (1.702 m)   Wt 161 lb 12.8 oz (73.4 kg)   BMI 25.34 kg/m   132/68 on repeat by MD  Wt Readings from Last 3 Encounters:  03/08/21 161 lb 12.8 oz (73.4 kg)  01/10/21 161 lb (73 kg)  10/24/20 163 lb (73.9 kg)    HEENT: conjunctiva and sclera are clear.EOMI. Wearing mask due to COVID-19 pandemic. Neck: no lymphadenopathy, thyromegalyor mass, no bruit Heart: regular rate and rhythm, no murmur Lungs: Distant breath sounds. No wheezes, rales, ronchi Back:no spinal or CVA tenderness Abdomen: soft, nontender, no organomegaly or mass Extremities: no edema Skin: normal turgor. Purpura noted at L forearm, R shin. Bruise at L elbow.  There is hyperpigmentation at shins bilaterally.  Skin is dry. Psych: normal mood, affect, hygiene and grooming Neuro: alert and oriented, normal gait  Lab Results  Component Value Date   HGBA1C 5.7 (A) 03/08/2021    ASSESSMENT/PLAN:  Medicare annual wellness visit, subsequent  Vitamin D deficiency - noncompliant with supplement. Discussed need for OTC D3 longterm.  Recheck level - Plan: VITAMIN D 25 Hydroxy (Vit-D Deficiency, Fractures), Vitamin D, Ergocalciferol, (DRISDOL) 1.25 MG (50000 UNIT) CAPS capsule  Aortic atherosclerosis (HCC) - continue statin  Chronic respiratory failure with hypoxia (HCC) - admitted with exacerbation in the fall, stable currently, no longer on oxygen.  Chronic obstructive pulmonary disease, unspecified COPD type ([place]) - managed by pulm, stable on current regimen (poss to add ICS prior to winter per pulm)  Osteopenia, unspecified location - Plan: VITAMIN D 25 Hydroxy (Vit-D Deficiency, Fractures)  Pure hypercholesterolemia - cont simvastatin - Plan: simvastatin (ZOCOR) 20 MG tablet  Senile purpura (HCC) - no longer on aspirin, still noted  Medication monitoring encounter - Plan: Comprehensive metabolic panel, VITAMIN D 25 Hydroxy (Vit-D Deficiency, Fractures)  Prediabetes - counseled re: diet, exercise - Plan: HgB A1c, Comprehensive metabolic panel  Essential hypertension - well controlled on current regimen, continue - Plan: losartan (COZAAR) 50 MG tablet, Comprehensive metabolic panel  Gastroesophageal reflux disease,  unspecified whether esophagitis present - reviewed diet, may also have some as SE to statin  Colon cancer screening - discussed that she is past due. She is not interested in any screening at this time, aware of poss consequences (not dx polyp early, potential CA)  Vaccine counseling - strongly encouraged that she get COVID booster--her risks were reviewed. She declines.  Asked to re-consider   FIT testing--declined Declines COVID booster today--highly recommended  Discussed monthly self breast exams and yearly mammogram; at least 30 minutes of aerobic activity at least 5 days/week, weight-bearing exercise 2x/wk; proper sunscreen use reviewed; healthy diet, including goals of calcium and vitamin D intake and alcohol recommendations (less than or equal to 1 drink/day) reviewed; regular seatbelt use; changing batteries in smoke detectors. Immunization recommendations discussed-- continue yearly flu shots.  Tdap and Shingrix recommended, to get from pharmacy. Risks/SE reviewed. COVID booster recommended, declined. Colonoscopy recommendations reviewed, past due per her report. She declines any screening at this time, discussed potential risks of later dx of cancer.  Full Code, Full care. Doesn't want prolonged measures. Discussed importance/reasons for completing Living Will and healthcare POA, encouraged to fill out and give [place] a copy after notarized. She would want full initial resuscitation, including intubation. But, did note that in hospital for COPD  exacerbation, she did NOT want to be intubated at that point. Discussed importance of documenting her wishes on her paperwork. Given another set so that she and her husband can both complete them (especially since he is having surgery this month). MOST form reviewed and signed.  Follow up as scheduled for CPE (may need to r/s if husband needs her at home).  She will call as soon as she knows.  Currently scheduled for 4/27, her husband is having surgery  4/21.   Medicare Attestation I have personally reviewed: The patient's medical and social history Their use of alcohol, tobacco or illicit drugs Their current medications and supplements The patient's functional ability including ADLs,fall risks, home safety risks, cognitive, and hearing and visual impairment Diet and physical activities Evidence for depression or mood disorders  The patient's weight, height, BMI have been recorded in the chart.  I have made referrals, counseling, and provided education to the patient based on review of the above and I have provided the patient with a written personalized care plan for preventive services.

## 2021-03-08 ENCOUNTER — Other Ambulatory Visit: Payer: Self-pay

## 2021-03-08 ENCOUNTER — Encounter: Payer: Self-pay | Admitting: Family Medicine

## 2021-03-08 ENCOUNTER — Ambulatory Visit (INDEPENDENT_AMBULATORY_CARE_PROVIDER_SITE_OTHER): Payer: Medicare Other | Admitting: Family Medicine

## 2021-03-08 VITALS — BP 132/68 | HR 64 | Ht 67.0 in | Wt 161.8 lb

## 2021-03-08 DIAGNOSIS — I700 Atherosclerosis of aorta: Secondary | ICD-10-CM | POA: Diagnosis not present

## 2021-03-08 DIAGNOSIS — I10 Essential (primary) hypertension: Secondary | ICD-10-CM

## 2021-03-08 DIAGNOSIS — Z1211 Encounter for screening for malignant neoplasm of colon: Secondary | ICD-10-CM

## 2021-03-08 DIAGNOSIS — Z0000 Encounter for general adult medical examination without abnormal findings: Secondary | ICD-10-CM | POA: Diagnosis not present

## 2021-03-08 DIAGNOSIS — J449 Chronic obstructive pulmonary disease, unspecified: Secondary | ICD-10-CM

## 2021-03-08 DIAGNOSIS — J9611 Chronic respiratory failure with hypoxia: Secondary | ICD-10-CM | POA: Diagnosis not present

## 2021-03-08 DIAGNOSIS — Z5181 Encounter for therapeutic drug level monitoring: Secondary | ICD-10-CM | POA: Diagnosis not present

## 2021-03-08 DIAGNOSIS — D692 Other nonthrombocytopenic purpura: Secondary | ICD-10-CM

## 2021-03-08 DIAGNOSIS — R7303 Prediabetes: Secondary | ICD-10-CM | POA: Diagnosis not present

## 2021-03-08 DIAGNOSIS — K219 Gastro-esophageal reflux disease without esophagitis: Secondary | ICD-10-CM

## 2021-03-08 DIAGNOSIS — E559 Vitamin D deficiency, unspecified: Secondary | ICD-10-CM

## 2021-03-08 DIAGNOSIS — E7800 Pure hypercholesterolemia, unspecified: Secondary | ICD-10-CM

## 2021-03-08 DIAGNOSIS — J441 Chronic obstructive pulmonary disease with (acute) exacerbation: Secondary | ICD-10-CM

## 2021-03-08 DIAGNOSIS — Z7185 Encounter for immunization safety counseling: Secondary | ICD-10-CM

## 2021-03-08 DIAGNOSIS — M8580 Other specified disorders of bone density and structure, unspecified site: Secondary | ICD-10-CM

## 2021-03-08 LAB — POCT GLYCOSYLATED HEMOGLOBIN (HGB A1C): Hemoglobin A1C: 5.7 % — AB (ref 4.0–5.6)

## 2021-03-08 MED ORDER — SIMVASTATIN 20 MG PO TABS: 20.0000 mg | ORAL_TABLET | Freq: Every day | ORAL | 1 refills | Status: DC

## 2021-03-08 MED ORDER — LOSARTAN POTASSIUM 50 MG PO TABS: 50.0000 mg | ORAL_TABLET | Freq: Every day | ORAL | 1 refills | Status: DC

## 2021-03-08 NOTE — Telephone Encounter (Signed)
This encounter was created in error - please disregard.

## 2021-03-09 LAB — COMPREHENSIVE METABOLIC PANEL
ALT: 12 IU/L (ref 0–32)
AST: 15 IU/L (ref 0–40)
Albumin/Globulin Ratio: 1.8 (ref 1.2–2.2)
Albumin: 4.7 g/dL (ref 3.7–4.7)
Alkaline Phosphatase: 109 IU/L (ref 44–121)
BUN/Creatinine Ratio: 13 (ref 12–28)
BUN: 11 mg/dL (ref 8–27)
Bilirubin Total: 0.5 mg/dL (ref 0.0–1.2)
CO2: 20 mmol/L (ref 20–29)
Calcium: 9 mg/dL (ref 8.7–10.3)
Chloride: 103 mmol/L (ref 96–106)
Creatinine, Ser: 0.88 mg/dL (ref 0.57–1.00)
Globulin, Total: 2.6 g/dL (ref 1.5–4.5)
Glucose: 101 mg/dL — ABNORMAL HIGH (ref 65–99)
Potassium: 4.6 mmol/L (ref 3.5–5.2)
Sodium: 141 mmol/L (ref 134–144)
Total Protein: 7.3 g/dL (ref 6.0–8.5)
eGFR: 69 mL/min/{1.73_m2} (ref >59)

## 2021-03-09 LAB — VITAMIN D 25 HYDROXY (VIT D DEFICIENCY, FRACTURES): Vit D, 25-Hydroxy: 24.6 ng/mL — ABNORMAL LOW (ref 30.0–100.0)

## 2021-03-09 MED ORDER — VITAMIN D (ERGOCALCIFEROL) 1.25 MG (50000 UNIT) PO CAPS: 50000.0000 [IU] | ORAL_CAPSULE | ORAL | 0 refills | Status: DC

## 2021-03-14 NOTE — Progress Notes (Signed)
Result called to patient by Doroteo Glassman RN on 02/27/2021 See telephone note. Lung RADS 2: nodules that are benign in appearance and behavior with a very low likelihood of becoming a clinically active cancer due to size or lack of growth. Recommendation per radiology is for a repeat LDCT in 12 months.

## 2021-03-21 NOTE — Patient Instructions (Signed)
  HEALTH MAINTENANCE RECOMMENDATIONS:  It is recommended that you get at least 30 minutes of aerobic exercise at least 5 days/week (for weight loss, you may need as much as 60-90 minutes). This can be any activity that gets your heart rate up. This can be divided in 10-15 minute intervals if needed, but try and build up your endurance at least once a week.  Weight bearing exercise is also recommended twice weekly.  Eat a healthy diet with lots of vegetables, fruits and fiber.  "Colorful" foods have a lot of vitamins (ie green vegetables, tomatoes, red peppers, etc).  Limit sweet tea, regular sodas and alcoholic beverages, all of which has a lot of calories and sugar.  Up to 1 alcoholic drink daily may be beneficial for women (unless trying to lose weight, watch sugars).  Drink a lot of water.  Calcium recommendations are 1200-1500 mg daily (1500 mg for postmenopausal women or women without ovaries), and vitamin D 1000 IU daily.  This should be obtained from diet and/or supplements (vitamins), and calcium should not be taken all at once, but in divided doses.  Monthly self breast exams and yearly mammograms for women over the age of 6 is recommended.  Sunscreen of at least SPF 30 should be used on all sun-exposed parts of the skin when outside between the hours of 10 am and 4 pm (not just when at beach or pool, but even with exercise, golf, tennis, and yard work!)  Use a sunscreen that says "broad spectrum" so it covers both UVA and UVB rays, and make sure to reapply every 1-2 hours.  Remember to change the batteries in your smoke detectors when changing your clock times in the spring and fall. Carbon monoxide detectors are recommended for your home.  Use your seat belt every time you are in a car, and please drive safely and not be distracted with cell phones and texting while driving.  A COVID booster is highly recommended, given your risk factors for complications if you were to get COVID, and  that the protection from your initial vaccines is wearing off and not as good with covering the variants.   I encourage you to get this as soon as possible (from our office or from a pharmacy).  Please get TdaP (tetanus booster) vaccine from pharmacy. This needs to be at least 2 weeks from any other vaccine (ie if you get your COVID vaccine, wait 2 weeks before getting the tetanus shot).  I recommend getting the new shingles vaccine (Shingrix). Since you have Medicare, you will need to get this from the pharmacy, as it is covered by Part D. This is a series of 2 injections, spaced 2 months apart. You should wait at least 2 weeks between any vaccines.  Continue yearly mammograms (due in May) Yearly eye exams are recommended.  Remember to complete Living Will and Healthcare power of attorney forms given at your recent visit, and get [place] copies once notarized to be scanned into your chart.  Once you finish the vitamin D prescription course (once weekly for 12 weeks), please start a daily supplement of 5000 IU of Vitamin D3, and take that daily, long-term.  You may want to take this with your losartan, to make it easier to remember.

## 2021-03-21 NOTE — Progress Notes (Signed)
Chief Complaint  Patient presents with  . Annual Exam    Annual exam with breast and pelvic, has already had AWV.   Marland Kitchen Immunizations    Does not want covid booster today.     Tiffany [name] is a 75 y.o. female who presents for a complete physical.  She had Medicare Wellness Visit and fasting labs done 2 weeks ago.  She was notified of results.  Husband had heart surgery Monday, still in ICU, but off ventilator.  Immunization History  Administered Date(s) Administered  . Fluad Quad(high Dose 65+) 09/15/2019, 08/08/2020  . Influenza Split 10/16/2012  . Influenza, High Dose Seasonal PF 09/02/2013, 08/25/2014, 08/31/2015, 11/01/2016, 09/09/2017, 09/09/2018  . PFIZER(Purple Top)SARS-COV-2 Vaccination 07/01/2020, 07/22/2020  . Pneumococcal Conjugate-13 08/25/2014  . Pneumococcal Polysaccharide-23 12/12/2009, 08/31/2015  . Tdap 12/12/2009   Last Pap smear: s/p hyst in 20's  Last mammogram:03/2020 Last colonoscopy: 11/2010 Dr. Wynetta Emery. She got a lettera couple of years agostating she was due, but didn't go. She declines repeating colonoscopy (unless she has symptoms). Last DEXA:03/2020 T-2.1 L femoral neck Dentist: doesn't go, has dentures  Ophtho: yearly Exercise: home exercises from PT most days.   PMH, PSH, SH and FH reviewed  Outpatient Encounter Medications as of 03/22/2021  Medication Sig  . losartan (COZAAR) 50 MG tablet Take 1 tablet (50 mg total) by mouth daily.  Marland Kitchen PROVENTIL HFA 108 (90 Base) MCG/ACT inhaler INHALE TWO PUFFS BY MOUTH EVERY 4 HOURS  . simvastatin (ZOCOR) 20 MG tablet Take 1 tablet (20 mg total) by mouth daily.  . Tiotropium Bromide-Olodaterol (STIOLTO RESPIMAT) 2.5-2.5 MCG/ACT AERS Inhale 2 puffs into the lungs daily.  . Vitamin D, Ergocalciferol, (DRISDOL) 1.25 MG (50000 UNIT) CAPS capsule Take 1 capsule (50,000 Units total) by mouth every 7 (seven) days.   No facility-administered encounter medications on file as of 03/22/2021.   No Known  Allergies  ROS: The patient denies anorexia, fever, headaches, vision changes, decreased hearing, ear pain, sore throat, breast concerns, chest pain, dizziness, syncope, swelling, nausea, vomiting, diarrhea, constipation, abdominal pain, melena, hematochezia, hematuria, incontinence, dysuria, vaginal bleeding, discharge, odor or itch, genital lesions, weakness, tremor, suspicious skin lesions, depression, anxiety, abnormal bleeding/bruising, or enlarged lymph nodes.  Occasional night sweats, no hot flashes during the day. Some vaginal dryness  Some intermittent itchy ears (ongoing/chronic, sometimes wakes her up at night) SOB only with stairs and carrying groceries uphill,overall much improved. She wasn't breathing as well this morning, needed to use albuterol, and is feeling better now.  Some smoker's cough -- thick, clear phlegm (less frequent since quitting smoking). Has heartburn with certain foods, such as pizza. Sometimes gets heartburn from simvastatin.  Tums prn helps. Some L knee pain, mainly with stairs, denies other joint pains. Some anxiety related to husband's recent surgery.   PHYSICAL EXAM:  BP 130/80   Pulse 64   Ht $R'5\' 7"'md$  (1.702 m)   Wt 161 lb 12.8 oz (73.4 kg)   BMI 25.34 kg/m   Wt Readings from Last 3 Encounters:  03/22/21 161 lb 12.8 oz (73.4 kg)  03/08/21 161 lb 12.8 oz (73.4 kg)  01/10/21 161 lb (73 kg)     General Appearance:  Alert, cooperative, no distress, appears stated age. Speaks easily in full sentences, in no distress  Head:  Normocephalic, without obvious abnormality, atraumatic   Eyes:  PERRL, conjunctiva/corneas clear, EOM's intact, fundi not well visualized, somewhat mioitic pupils  Ears:  Normal TM's and external ear canals  Nose:  Not examined,  wearing mask due to COVID-19 pandemic  Throat:  Not examined, wearing mask due to COVID-19 pandemic  Neck:  Supple, no lymphadenopathy; thyroid: no enlargement/ tenderness/nodules; no  carotid bruit or JVD   Back:  Spine nontender, no curvature, ROM normal, no CVA tenderness. Spasm/tightness at L lumbar paraspinous muscle.  Minimally tender  Lungs:  Decreased air movement, but otherwise clear.  No wheezes, rales, ronchi  Chest Wall:  No tenderness or deformity   Heart:  Regular rate and rhythm, S1 and S2 normal, no murmur, rub or gallop   Breast Exam:  No masses or nipple discharge or inversion. No axillary lymphadenopathy.No masses or tenderness.  Abdomen:  Soft, non-tender, nondistended, normoactive bowel sounds, no masses, no hepatosplenomegaly   Genitalia:  Normal external genitalia without lesions. BUS and vagina normal, some atrophic changes. 2 small cysts noted on L labia, nontender. No abnormal vaginal discharge. Uterus absent. No adnexal masses or tenderness. Pap not performed   Rectal:  Normal tone, no masses or tenderness; guaiac negative stool   Extremities:  No clubbing, cyanosis or edema.  Pulses:  Slightly diminished,symmetric.Feet are warm, brisk cap refill  Skin:  Turgor normal, no rashes. Significant actinic changes to skin.Skin is dry on her legs. Some mild bruising/purpura on arms. There is a hyperkeratotic/rough patch at the dorsum of R wrist (sees derm, has cream to use, itchy per pt). Nonpigmented SK's on back. Hyperpigmentation of shins bilaterally.  Lymph nodes:  Cervical, supraclavicular, and axillary nodes normal   Neurologic:  CNII-XII intact, normal strength, sensation and gait; reflexes 2+ and symmetric throughout              Psych:  Normal mood, affect, hygiene and grooming   Recent labs: Vitamin D-OH 24.6   Chemistry      Component Value Date/Time   NA 141 03/08/2021 1109   K 4.6 03/08/2021 1109   CL 103 03/08/2021 1109   CO2 20 03/08/2021 1109   BUN 11 03/08/2021 1109   CREATININE 0.88 03/08/2021 1109   CREATININE 0.72 12/02/2017 0945      Component Value Date/Time   CALCIUM 9.0 03/08/2021 1109    ALKPHOS 109 03/08/2021 1109   AST 15 03/08/2021 1109   ALT 12 03/08/2021 1109   BILITOT 0.5 03/08/2021 1109     Fasting glucose 101  Lab Results  Component Value Date   HGBA1C 5.7 (A) 03/08/2021    ASSESSMENT/PLAN:  Annual physical exam - Plan: POCT Urinalysis DIP (Proadvantage Device)  Vitamin D deficiency - complete 12 week prescription course, followed by taking a daily supplement, long-term  Prediabetes - Very mild impaired fasting glucose.  Cont to limit sugar in diet, regular exercise encouraged.  Strongly encouraged COVID booster, declined again.  Discussed monthly self breast exams and yearly mammogram; at least 30 minutes of aerobic activity at least 5 days/week, weight-bearing exercise 2x/wk; proper sunscreen use reviewed; healthy diet, including goals of calcium and vitamin D intake and alcohol recommendations (less than or equal to 1 drink/day) reviewed; regular seatbelt use; changing batteries in smoke detectors. Immunization recommendations discussed-- continue yearly flu shots.  Tdap and Shingrixagain recommended, to get from pharmacy. COVID booster recommended, declined.Colonoscopy recommendations reviewed, past due per her report. She declines any screening at this time, aware of potential risks of later dx of cancer.  F/u med check 6 mos--will come fasting (will be due for D, c-met, lipid, A1c)

## 2021-03-22 ENCOUNTER — Other Ambulatory Visit: Payer: Self-pay

## 2021-03-22 ENCOUNTER — Ambulatory Visit (INDEPENDENT_AMBULATORY_CARE_PROVIDER_SITE_OTHER): Payer: Medicare Other | Admitting: Family Medicine

## 2021-03-22 ENCOUNTER — Encounter: Payer: Self-pay | Admitting: Family Medicine

## 2021-03-22 VITALS — BP 130/80 | HR 64 | Ht 67.0 in | Wt 161.8 lb

## 2021-03-22 DIAGNOSIS — E559 Vitamin D deficiency, unspecified: Secondary | ICD-10-CM | POA: Diagnosis not present

## 2021-03-22 DIAGNOSIS — Z0000 Encounter for general adult medical examination without abnormal findings: Secondary | ICD-10-CM | POA: Diagnosis not present

## 2021-03-22 DIAGNOSIS — R7303 Prediabetes: Secondary | ICD-10-CM | POA: Diagnosis not present

## 2021-03-22 LAB — POCT URINALYSIS DIP (PROADVANTAGE DEVICE)
Bilirubin, UA: NEGATIVE
Blood, UA: NEGATIVE
Glucose, UA: NEGATIVE mg/dL
Ketones, POC UA: NEGATIVE mg/dL
Leukocytes, UA: NEGATIVE
Nitrite, UA: NEGATIVE
Protein Ur, POC: NEGATIVE mg/dL
Specific Gravity, Urine: 1.01
Urobilinogen, Ur: NEGATIVE
pH, UA: 6 (ref 5.0–8.0)

## 2021-05-18 ENCOUNTER — Other Ambulatory Visit: Payer: Self-pay

## 2021-05-18 ENCOUNTER — Other Ambulatory Visit: Payer: Self-pay | Admitting: Family Medicine

## 2021-05-18 ENCOUNTER — Ambulatory Visit
Admission: RE | Admit: 2021-05-18 | Discharge: 2021-05-18 | Disposition: A | Discharge status: Home / Self Care | Payer: Medicare Other | Source: Ambulatory Visit | Attending: Family Medicine | Admitting: Family Medicine

## 2021-05-18 DIAGNOSIS — Z1231 Encounter for screening mammogram for malignant neoplasm of breast: Secondary | ICD-10-CM

## 2021-06-23 DIAGNOSIS — L905 Scar conditions and fibrosis of skin: Secondary | ICD-10-CM | POA: Diagnosis not present

## 2021-06-23 DIAGNOSIS — D229 Melanocytic nevi, unspecified: Secondary | ICD-10-CM | POA: Diagnosis not present

## 2021-06-23 DIAGNOSIS — L814 Other melanin hyperpigmentation: Secondary | ICD-10-CM | POA: Diagnosis not present

## 2021-06-23 DIAGNOSIS — C4441 Basal cell carcinoma of skin of scalp and neck: Secondary | ICD-10-CM | POA: Diagnosis not present

## 2021-06-23 DIAGNOSIS — L821 Other seborrheic keratosis: Secondary | ICD-10-CM | POA: Diagnosis not present

## 2021-06-27 ENCOUNTER — Other Ambulatory Visit: Payer: Self-pay | Admitting: *Deleted

## 2021-06-27 NOTE — Patient Outreach (Signed)
[facility]) Care Management  06/27/2021  Tiffany [name] 24-May-1946 [id number]   RN Health Coach attempted follow up outreach call to patient.  Patient was unavailable. HIPPA compliance voicemail message left with return callback number.  Plan: RN sent unsuccessful outreach letter RN will call patient again within 30 days.  [place] Management [phone]

## 2021-07-10 ENCOUNTER — Other Ambulatory Visit: Payer: Self-pay

## 2021-07-10 ENCOUNTER — Encounter: Payer: Self-pay | Admitting: Pulmonary Disease

## 2021-07-10 ENCOUNTER — Ambulatory Visit: Payer: Medicare Other | Admitting: Pulmonary Disease

## 2021-07-10 VITALS — BP 122/78 | HR 60 | Temp 98.1°F | Ht 67.0 in | Wt 158.4 lb

## 2021-07-10 DIAGNOSIS — J449 Chronic obstructive pulmonary disease, unspecified: Secondary | ICD-10-CM

## 2021-07-10 MED ORDER — PROVENTIL HFA 108 (90 BASE) MCG/ACT IN AERS: INHALATION_SPRAY | RESPIRATORY_TRACT | 5 refills | Status: DC

## 2021-07-10 MED ORDER — FLUTICASONE PROPIONATE HFA 110 MCG/ACT IN AERO: 2.0000 | INHALATION_SPRAY | Freq: Two times a day (BID) | RESPIRATORY_TRACT | 6 refills | Status: DC

## 2021-07-10 MED ORDER — STIOLTO RESPIMAT 2.5-2.5 MCG/ACT IN AERS
2.0000 | INHALATION_SPRAY | Freq: Every day | RESPIRATORY_TRACT | 11 refills | Status: DC
Start: 2021-07-10 — End: 2022-08-20

## 2021-07-10 NOTE — Patient Instructions (Addendum)
Severe COPD, GOLD Class B --START medium Flovent TWO puffs TWICE a day. Rinse mouth out after use. --CONTINUE Stiolto Respimat TWO puffs ONCE a day. Check for financial assistance reapplication --CONTINUE Albuterol as needed for shortness of breath or wheezing. REFILL  Follow-up with me in 6 months

## 2021-07-10 NOTE — Progress Notes (Signed)
Subjective:   [name] GENDER: female DOB: Feb 28, 1946, MRN: [id number]   HPI  Chief Complaint  Patient presents with   Follow-up    No c/o    Ms. Tiffany [name] is 75 year old female former smoker with severe COPD who presents for follow-up.  01/10/21 Since our last visit, she has been compliant with Stioloto. Still has shortness of breath when walking up stairs for her laundry in the baseline. Denies cough and wheezing. She may use her albuterol up to twice a day for activity, which works very well. She has not been as active due to the cold weather. She has completed hip exercises with PT but this did not involve very much cardio. She did have recent COPD exacerbations one treated as outpatient in September and an inpatient hospital stay in October last year requiring antibiotics and steroids.   07/10/21 Since our last visit, she reports her breathing overall. No limitations in activity including grocery shop. She is compliant with with Stioloto once daily. She has been assisting her husband after her husband had open heart aortic valve repair and bypass in April 2022. She has been doing more stair climbing and lifting laundry in the basement. Occasional cough with productive sputum that will clear with coughing. She is using albuterol 3-4 times a day. Last exacerbation requiring hospitalization 08/2020.  Social History: Former smoker.  Quit 08/2018.  50 pack years Son lives in [place]  Past Medical History:  Diagnosis Date   Abnormal PFT 09/02/14   moderate restrictive ventilatory defect.   BCC (basal cell carcinoma), face 2010   Dr. Allyson Sabal   Colon polyp 2/05   Diverticulosis 11/2010   seen on colonoscopy and air contrast BE   Emphysema of lung ([place])    Hyperlipidemia    Hypertension    previously treated with meds, resolved   MGUS (monoclonal gammopathy of unknown significance) 1999   prevously under care of Dr. 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 90 tablet 1   PROVENTIL HFA 108 (90 Base) MCG/ACT inhaler INHALE TWO PUFFS BY MOUTH EVERY 4 HOURS 6.7 g 5   simvastatin (ZOCOR) 20 MG tablet Take 1 tablet (20 mg total) by mouth daily. 90 tablet 1   Tiotropium Bromide-Olodaterol (STIOLTO RESPIMAT) 2.5-2.5 MCG/ACT AERS Inhale 2 puffs into the lungs daily. 4 g 11   Vitamin D, Ergocalciferol, (DRISDOL) 1.25 MG (50000 UNIT) CAPS capsule Take 1 capsule (50,000 Units total) by mouth every 7 (seven) days. 12 capsule 0   No facility-administered medications prior to visit.    Review of Systems  Constitutional:  Negative for chills, diaphoresis, fever, malaise/fatigue and weight loss.  HENT:  Negative for congestion.   Respiratory:  Positive for shortness of breath. Negative for cough, hemoptysis, sputum production and wheezing.   Cardiovascular:  Negative for chest pain, palpitations and leg swelling.   Objective:   Vitals:   07/10/21 1120  BP: 122/78  Pulse: 60  Temp: 98.1 F (36.7 C)  TempSrc: Oral  SpO2: 97%  Weight: 158 lb 6.4 oz (71.8 kg)  Height: '5\' 7"'$  (1.702 m)  Physical Exam: General: Well-appearing, no acute distress HENT: [place], AT Eyes: EOMI, no scleral icterus Respiratory: Diminished breath sounds bilaterally.  No crackles, wheezing or rales Cardiovascular: RRR, -M/R/G, no JVD Extremities:-Edema,-tenderness Neuro: AAO x4, CNII-XII grossly intact Psych: Normal mood, normal affect  Chest imaging: CXR 09/10/2018- hyperinflated lung fields.  No acute infiltrate/effusion or edema CT Chest Lung 01/12/19 - Emphysema, unchanged pulmonary nodules CT Chest Lung Screen 01/26/20 - Unchanged pulmonary nodules, larges 3.78m in RML. Background  emphysema CT Chest Lung Screen 02/14/21 - Centrilobular and paraseptal emphysema. Biapical pleuroparenchymal scarring. Tiny pulmonary nodules, unchanged.  PFT: 12/02/2017-severe obstructive defect. Ratio 58 FEV1 37 FVC 48    Assessment & Plan:  75year old female with severe COPD who presents for follow-up. Last exacerbation 08/2020. Symptoms worsen over fall/winter. We discussed bronchodilator management and exacerbation management. Will add ICS for six months over winter to step up therapy  Emphysema Severe COPD, GOLD Class B - uncontrolled --START medium Flovent TWO puffs TWICE a day. Rinse mouth out after use. --CONTINUE Stiolto Respimat TWO puffs ONCE a day. Check for financial assistance reapplication --CONTINUE Albuterol as needed for shortness of breath or wheezing. REFILL  Health Maintenance Immunization History  Administered Date(s) Administered   Fluad Quad(high Dose 65+) 09/15/2019, 08/08/2020   Influenza Split 10/16/2012   Influenza, High Dose Seasonal PF 09/02/2013, 08/25/2014, 08/31/2015, 11/01/2016, 09/09/2017, 09/09/2018   PFIZER(Purple Top)SARS-COV-2 Vaccination 07/01/2020, 07/22/2020   Pneumococcal Conjugate-13 08/25/2014   Pneumococcal Polysaccharide-23 12/12/2009, 08/31/2015   Tdap 12/12/2009   CT Lung Screen - Due 01/2022  No orders of the defined types were placed in this encounter.  Meds ordered this encounter  Medications   fluticasone (FLOVENT HFA) 110 MCG/ACT inhaler    Sig: Inhale 2 puffs into the lungs in the morning and at bedtime.    Dispense:  1 each    Refill:  6   Tiotropium Bromide-Olodaterol (STIOLTO RESPIMAT) 2.5-2.5 MCG/ACT AERS    Sig: Inhale 2 puffs into the lungs daily.    Dispense:  4 g    Refill:  11   PROVENTIL HFA 108 (90 Base) MCG/ACT inhaler    Sig: INHALE TWO PUFFS BY MOUTH EVERY 4 HOURS    Dispense:  6.7 g    Refill:  5    Return in about 6 months (around 01/10/2022).   I have spent a total time of 35-minutes on the day of  the appointment reviewing prior documentation, coordinating care and discussing medical diagnosis and plan with the patient/family. Past medical history, allergies, medications were reviewed. Pertinent imaging, labs and tests included in this note have been reviewed and interpreted independently by me.   JRodman Pickle MD [facility] 07/10/2021 10:04 AM

## 2021-07-27 HISTORY — PX: SKIN CANCER EXCISION: SHX779

## 2021-08-03 DIAGNOSIS — C4441 Basal cell carcinoma of skin of scalp and neck: Secondary | ICD-10-CM | POA: Diagnosis not present

## 2021-08-08 ENCOUNTER — Telehealth: Payer: Self-pay | Admitting: *Deleted

## 2021-08-08 NOTE — Telephone Encounter (Signed)
Patient assistance paperwork received, placed in Dr. Cordelia Pen sign folder for signature.

## 2021-08-10 ENCOUNTER — Encounter: Payer: Self-pay | Admitting: Pulmonary Disease

## 2021-08-23 ENCOUNTER — Other Ambulatory Visit: Payer: Self-pay | Admitting: *Deleted

## 2021-08-28 ENCOUNTER — Telehealth: Payer: Self-pay | Admitting: Family Medicine

## 2021-08-28 NOTE — Chronic Care Management (AMB) (Signed)
  Chronic Care Management   Note  08/28/2021 Name: Jenai Scaletta Nomura MRN: [id number] DOB: Apr 20, 1946  Ronna Herskowitz Twining is a 75 y.o. year old female who is a primary care patient of Rita Ohara, MD. I reached out to [place] by phone today in response to a referral sent by Ms. Heron Nay Stanly's PCP, Rita Ohara, MD.   Ms. Kashuba was given information about Chronic Care Management services today including:  CCM service includes personalized support from designated clinical staff supervised by her physician, including individualized plan of care and coordination with other care providers 24/7 contact phone numbers for assistance for urgent and routine care needs. Service will only be billed when office clinical staff spend 20 minutes or more in a month to coordinate care. Only one practitioner may furnish and bill the service in a calendar month. The patient may stop CCM services at any time (effective at the end of the month) by phone call to the office staff.   Patient wishes to consider information provided and/or speak with a member of the care team before deciding about enrollment in care management services.   Follow up plan: States she only takes 2 meds  Psychologist, prison and probation services

## 2021-09-06 NOTE — Patient Outreach (Signed)
[facility]) Care Management  [place]  09/06/2021   Tiffany [name] Apr 15, 1946 [id number]  RN Health Coach telephone call to patient.  [employer] compliance verified. Per patient she has not had any exacerbation of COPD. Per patient she is using her inhalers as per ordered. Per patient her appetite is good. She will follow up with her flu vaccine. Patient has agreed to follow up outreach calls.    Encounter Medications:  Outpatient Encounter Medications as of 08/23/2021  Medication Sig   fluticasone (FLOVENT HFA) 110 MCG/ACT inhaler Inhale 2 puffs into the lungs in the morning and at bedtime.   losartan (COZAAR) 50 MG tablet Take 1 tablet (50 mg total) by mouth daily.   PROVENTIL HFA 108 (90 Base) MCG/ACT inhaler INHALE TWO PUFFS BY MOUTH EVERY 4 HOURS   simvastatin (ZOCOR) 20 MG tablet Take 1 tablet (20 mg total) by mouth daily.   Tiotropium Bromide-Olodaterol (STIOLTO RESPIMAT) 2.5-2.5 MCG/ACT AERS Inhale 2 puffs into the lungs daily.   Vitamin D, Ergocalciferol, (DRISDOL) 1.25 MG (50000 UNIT) CAPS capsule Take 1 capsule (50,000 Units total) by mouth every 7 (seven) days.   No facility-administered encounter medications on file as of 08/23/2021.    Functional Status:  In your present state of health, do you have any difficulty performing the following activities: 03/08/2021 09/20/2020  Hearing? N N  Vision? N N  Difficulty concentrating or making decisions? Y N  Comment short term memory -  Walking or climbing stairs? Y N  Comment left knee gets winded walkiing steps  Dressing or bathing? N N  Doing errands, shopping? N N  Preparing Food and eating ? N N  Using the Toilet? N N  In the past six months, have you accidently leaked urine? N N  Do you have problems with loss of bowel control? N N  Managing your Medications? N N  Managing your Finances? N N  Housekeeping or managing your Housekeeping? N N  Some recent data might be hidden     Fall/Depression Screening: Fall Risk  03/22/2021 03/08/2021 09/20/2020  Falls in the past year? 0 0 0  Number falls in past yr: 0 0 0  Injury with Fall? 0 0 0  Risk for fall due to : No Fall Risks No Fall Risks No Fall Risks  Follow up Falls evaluation completed Falls evaluation completed -   PHQ 2/9 Scores 03/22/2021 03/08/2021 01/24/2021 09/20/2020 08/08/2020 02/04/2020 10/16/2019  PHQ - 2 Score 0 0 0 0 0 0 0    Assessment:   Care Plan Care Plan : COPD (Adult)  Updates made by Verlin Grills, RN since 09/06/2021 12:00 AM     Problem: Disease Progression (COPD)   Priority: High  Onset Date: 09/20/2020     Long-Range Goal: Disease Progression Minimized or Managed   Start Date: 09/20/2020  Expected End Date: 12/25/2021  This Visit's Progress: On track  Recent Progress: On track  Priority: High     Task: Alleviate Barriers to COPD Management   Due Date: 12/25/2021  Priority: Routine  Note:   Care Management Activities:    - breathing techniques encouraged - medication-adherence assessment completed - rescue (action) plan reviewed - self-awareness of symptom triggers encouraged    Notes:  [id number] Patient is monitoring for her triggers. She is following up with her symptoms to control COPD exacerbation    Problem: Symptom Exacerbation (COPD)   Priority: High  Onset Date: 09/20/2020     Long-Range Goal:  Symptom Exacerbation Prevented or Minimized   Start Date: 09/20/2020  Expected End Date: 12/25/2021  This Visit's Progress: On track  Recent Progress: On track  Priority: High     Task: Identify and Minimize Risk of COPD Exacerbation   Due Date: 12/25/2021  Priority: Routine  Note:   Care Management Activities:    - rescue (action) plan reviewed - signs/symptoms of infection reviewed - signs/symptoms of worsening disease assessed - treatment plan reviewed    Notes: Discussed and encouraged importance of follow up visit       Goals Addressed              This Visit's Progress    (THN)Manage Fatigue (Tiredness)       Timeframe:  Long-Range Goal Priority:  Medium Start Date: [id number]                            Expected End Date:       [id number]         Follow Up Date [id number]   - eat healthy - limit daytime naps Balance activity during the day with rest periods     Why is this important?   Feeling tired or worn out is a common symptom of COPD (chronic obstructive pulmonary disease).  Learning when you feel your best and when you need rest is important.  Managing the tiredness (fatigue) will help you be active and enjoy life.     Notes:  [id number] patient is trying to eat healthy . [id number] Patient is resting as needed     (THN)Track and Manage My Triggers       Follow Up Date [id number] Timeframe:  Long-Range Goal Priority:  Medium Start Date:   09/20/20                          Expected End Date: [id number] Follow up date 91660600   - limit outdoor activity during cold weather    Why is this important?   Triggers are activities or things, like tobacco smoke or cold weather, that make your COPD (chronic obstructive pulmonary disease) flare-up.  Knowing these triggers helps you plan how to stay away from them.  When you cannot remove them, you can learn how to manage them.     Notes:  [id number] Patient is monitoring her symptoms.  [id number] No episodes of COPD exacerbation        Plan:  Follow-up: Follow-up in 3 month(s) RN sent information on How to use a metered inhaler Patient will follow up with flu vaccine RN sent update assessment to PCP  [facility] [phone]

## 2021-09-06 NOTE — Patient Instructions (Signed)
Goals Addressed             This Visit's Progress    (THN)Manage Fatigue (Tiredness)       Timeframe:  Long-Range Goal Priority:  Medium Start Date: [id number]                            Expected End Date:       16606301         Follow Up Date [id number]   - eat healthy - limit daytime naps Balance activity during the day with rest periods     Why is this important?   Feeling tired or worn out is a common symptom of COPD (chronic obstructive pulmonary disease).  Learning when you feel your best and when you need rest is important.  Managing the tiredness (fatigue) will help you be active and enjoy life.     Notes:  [id number] patient is trying to eat healthy . [id number] Patient is resting as needed     (THN)Track and Manage My Triggers       Follow Up Date 76283151 Timeframe:  Long-Range Goal Priority:  Medium Start Date:   09/20/20                          Expected End Date: 76160737 Follow up date 10626948   - limit outdoor activity during cold weather    Why is this important?   Triggers are activities or things, like tobacco smoke or cold weather, that make your COPD (chronic obstructive pulmonary disease) flare-up.  Knowing these triggers helps you plan how to stay away from them.  When you cannot remove them, you can learn how to manage them.     Notes:  [id number] Patient is monitoring her symptoms.  [id number] No episodes of COPD exacerbation

## 2021-09-20 NOTE — Patient Instructions (Addendum)
I highly encourage you to get the bivalent (new) COVID booster. You can choose between [employer] or Tecolote.  Since you didn't want to get it today, along with your flu shot, you need to wait 2 weeks before you can get it.  PLEASE schedule a nurse visit for this vaccine, or get it from the pharmacy.  You are past due for a tetanus booster. You need to get this from the pharmacy ([facility]).  You should wait at least 2 weeks between various vaccines.  You will be due for another bone density test in May 2023.  If you want to wait until June and get it the same day as your mammogram, that is fine.  You should call to schedule these tests for the same day (the order for the bone density test was placed today).  Be sure to get 1200-1500mg  of calcium day, between what you get from the diet and your vitamins. See the handout for dietary sources. We are rechecking your vitamin D. I suspect it will be low again, since you haven't been taking a separate supplement.  We will let you know if we are sending in another prescription, versus just having you start the over-the-counter. Please buy a Vitamin D3 5000 IU, and take this EVERY DAY, LONG-TERM to maintain your vitamin D levels.  Please call Dr. Loanne Drilling and let her know that you're having trouble tolerating the Flovent (due to tongue irritation, despite rinsing properly). Also let her know that you really haven't noticed much improvement, and still need albuterol a couple of times/day. She may want to make a change to your inhaler.

## 2021-09-20 NOTE — Progress Notes (Signed)
Chief Complaint  Patient presents with   Hyperlipidemia   Hypertension   Immunizations    Declines Covid Booster today, requests flu shot   Hyperglycemia    For history of elevated blood sugar, requests A1C be added to other blood work today instead of a finger stick    Patient presents for follow-up on chronic problems. Husband had open heart surgery in April, and all she does is manage his medications.  She admits she hasn't been as good at taking care of herself (with respect to medications)  Hypertension. She is compliant with taking losartan 50mg . She denies side effects, headaches, chest pain, palpitations, edema, shortness of breath (other than from COPD).   BP's are running 120's/70-80. BP Readings from Last 3 Encounters:  09/21/21 118/70  07/10/21 122/78  03/22/21 130/80    Hyperlipidemia: She is compliant with taking simvastatin. Tries to follow lowfat, low cholesterol diet. She was noted to have aortic atherosclerosis on CT for lung cancer screening, and calcified atherosclerotic plaque in R coronary artery. Carotid US in 06/2019 didn't show any hemodynamically significant stenosis.  She denies any chest pain (just chronic DOE, improved with treatment for COPD), denies neurologic symptoms. Lipids were at goal last year on this regimen, due for recheck. Lab Results  Component Value Date   CHOL 164 08/08/2020   HDL 57 08/08/2020   LDLCALC 88 08/08/2020   TRIG 104 08/08/2020   CHOLHDL 2.9 08/08/2020    Osteopenia--She previously took bisphosphonates (changed from fosamax (due to nausea), then both Actonel and Boniva, which she tolerated but stopped due to cost issues. She stopped it without contacting us, possibly had some heartburn. She hadn't been taking any calcium, and has vitamin D deficiency (see below).  She still isn't taking any calcium. She does some home exercises for her back, but not getting any regular weight-bearing exercise. She quit smoking 08/2018.   DEXA  was rechecked 03/2020, and was stable (T-2.1 at left fem neck). Encouraged adequate Ca, D and weight-bearing exercise, and repeat in 2 years.   Vitamin D deficiency:  Last level was 24.6 in 02/2021. She hadn't take any supplements since completing rx course (had been low at 23.3 in 07/2020. She was supposed to start OTC D3 (5000 IU) upon finishing the prescription. She completed the prescription, but again never started the OTC supplement.  Due for recheck today.  Tobacco use and COPD--She quit smoking 09/10/18. Breathing has improved, still has some DOE walking up hill with groceries. She remains under the care of pulmonary. She was hospitalized in October 2021 for COPD exacerbation. Pulmonary previously mentioned possibly starting ICS before winter. At her last visit with DrLoanne Drilling in 06/2021 she prescribed Flovent 2p BID to be added to her Stiolto (2 puffs qd) through Fall/winter to help prevent exacerbation. She uses albuterol prn, used it this morning.  Needs it once or twice daily.  Doesn't notice much improvement since starting the Flovent.  She notes irritation to her tongue since starting the Flovent, despite rinsing her mouth out.  She needed to use some of her husband's oxygen for a bit when she was having to lift his wheelchair in/out of the car.  She no longer needs this.  She gets yearly lung cancer screening CT's, last 01/2021, stable.   Atherosclerosis:  She is compliant with her statin. She no longer takes any aspirin (bothered her stomach).   H/o elevated TSH--TSH of 4.75 in 10/2016, with normal free T4; recheck in June 2018 was stable (  4.44). TSH was subsequently normal, in 11/2018 . She denies any changes in hair/skin/bowels/energy/temperature intolerance.  Notes some hair loss, nothing new recently, just with age.  Lab Results  Component Value Date   TSH 3.700 12/03/2018    Pre-diabetes:  She had  some mildly elevated fasting sugars (101), and A1c was noted to be elevated when  hospitalized in 08/2020, at 5.9%. She had been having a hard candy after a meal (instead of a cigarette). She doesn't do this as often anymore.  Limits sweets. +carbs.  Eats low sodium potato chips.  Immunizations reviewed-- Only had 2 original Register, no boosters.  Declines today.  PMH, PSH, SH reviewed  Outpatient Encounter Medications as of 09/21/2021  Medication Sig Note   fluticasone (FLOVENT HFA) 110 MCG/ACT inhaler Inhale 2 puffs into the lungs in the morning and at bedtime.    losartan (COZAAR) 50 MG tablet Take 1 tablet (50 mg total) by mouth daily.    PROVENTIL HFA 108 (90 Base) MCG/ACT inhaler INHALE TWO PUFFS BY MOUTH EVERY 4 HOURS 09/21/2021: Last taken this morning - Takes PRN   simvastatin (ZOCOR) 20 MG tablet Take 1 tablet (20 mg total) by mouth daily.    Tiotropium Bromide-Olodaterol (STIOLTO RESPIMAT) 2.5-2.5 MCG/ACT AERS Inhale 2 puffs into the lungs daily.    Vitamin D, Ergocalciferol, (DRISDOL) 1.25 MG (50000 UNIT) CAPS capsule Take 1 capsule (50,000 Units total) by mouth every 7 (seven) days. (Patient not taking: Reported on 09/21/2021) 09/21/2021: Finished months ago unsure exactly when   No facility-administered encounter medications on file as of 09/21/2021.   No Known Allergies   ROS: no fever, chills, headaches, dizziness, chest pain, palpitations, URI symptoms.  Cough/breathing at baseline.  No edema.  No bleeding, rash.  +bruising easily (chronic, unchanged). No nausea, vomiting, diarrhea other other GI complaints. No GU complaints. Moods are good.   PHYSICAL EXAM:  BP 118/70 (BP Location: Right Arm, Patient Position: Sitting)   Pulse (!) 51   Ht 5\' 7"  (1.702 m)   Wt 162 lb 12.8 oz (73.8 kg)   SpO2 97%   BMI 25.50 kg/m   Wt Readings from Last 3 Encounters:  09/21/21 162 lb 12.8 oz (73.8 kg)  07/10/21 158 lb 6.4 oz (71.8 kg)  03/22/21 161 lb 12.8 oz (73.4 kg)   HEENT: conjunctiva and sclera are clear. EOMI. TM's and EACs are normal. Wearing  mask Neck: no lymphadenopathy, thyromegaly or mass, no bruit. Heart: regular rate and rhythm, no murmur Lungs: somewhat distant, especially in upper lung fields. No wheezes, rales, ronchi Back: no spinal or CVA tenderness Abdomen: soft, nontender, no organomegaly or mass Extremities: no edema, normal pulses Skin: normal turgor, no rash. Purpura on arms and legs Psych: normal mood, affect, hygiene and grooming Neuro: alert and oriented, normal gait    ASSESSMENT/PLAN:  Essential hypertension - Plan: Comprehensive metabolic panel, losartan (COZAAR) 50 MG tablet  Vitamin D deficiency - noncompliant with supplement, suspect will be low.  Discussed need for ongoing supplementation (5000 IU daily rec) - Plan: VITAMIN D 25 Hydroxy (Vit-D Deficiency, Fractures)  Osteopenia, unspecified location - counseled re: Ca intake, D, weight-bearing exercise.  pt to call and schedule DEXA for May/June 2023 - Plan: VITAMIN D 25 Hydroxy (Vit-D Deficiency, Fractures), DG Bone Density  Chronic obstructive pulmonary disease, unspecified COPD type ([place]) - under care of pulm. Needing albuterol 1-2x/d, no significant benefit from Flovent, and tongue irritation. To f/u with pulm re: these concerns  Prediabetes - counseled re: low  sugar, low carb diet - Plan: Comprehensive metabolic panel, Hemoglobin A1c  Pure hypercholesterolemia - cont statin and low cholesterol diet; due for recheck - Plan: Lipid panel  Aortic atherosclerosis ([facility]) - cont statin  Needs flu shot - Plan: Flu Vaccine QUAD High Dose(Fluad)  Medication monitoring encounter - Plan: VITAMIN D 25 Hydroxy (Vit-D Deficiency, Fractures), Comprehensive metabolic panel, Lipid panel  Essential hypertension - well controlled on current regimen, continue - Plan: Comprehensive metabolic panel, losartan (COZAAR) 50 MG tablet  Vaccine counseling - strongly encouraged COVID booster (bivalent); declines today. Rec she re-consider, schedule NV or get from pharm  in 2 weeks  Senile purpura ([place]) - on bilat arms/legs. No longer on any aspirin, related to aging   Reminded to get Tdap from pharmacy.   F/u 6 months for CPE, with AWV 2 weeks later [facility])  Addendum: Lab Results  Component Value Date   CHOL 206 (H) 09/21/2021   HDL 58 09/21/2021   LDLCALC 127 (H) 09/21/2021   TRIG 118 09/21/2021   CHOLHDL 3.6 09/21/2021   LDL much higher. Pt reports she hadn't taken any in the last week.  Will cont/restart current dose of simva and recheck at next visit.  Will need to change statin if consistently >100.

## 2021-09-21 ENCOUNTER — Ambulatory Visit (INDEPENDENT_AMBULATORY_CARE_PROVIDER_SITE_OTHER): Payer: Medicare Other | Admitting: Family Medicine

## 2021-09-21 ENCOUNTER — Other Ambulatory Visit: Payer: Self-pay

## 2021-09-21 ENCOUNTER — Encounter: Payer: Self-pay | Admitting: Family Medicine

## 2021-09-21 VITALS — BP 118/70 | HR 51 | Ht 67.0 in | Wt 162.8 lb

## 2021-09-21 DIAGNOSIS — R7303 Prediabetes: Secondary | ICD-10-CM

## 2021-09-21 DIAGNOSIS — Z5181 Encounter for therapeutic drug level monitoring: Secondary | ICD-10-CM | POA: Diagnosis not present

## 2021-09-21 DIAGNOSIS — I10 Essential (primary) hypertension: Secondary | ICD-10-CM | POA: Diagnosis not present

## 2021-09-21 DIAGNOSIS — M8580 Other specified disorders of bone density and structure, unspecified site: Secondary | ICD-10-CM | POA: Diagnosis not present

## 2021-09-21 DIAGNOSIS — Z23 Encounter for immunization: Secondary | ICD-10-CM

## 2021-09-21 DIAGNOSIS — J449 Chronic obstructive pulmonary disease, unspecified: Secondary | ICD-10-CM | POA: Diagnosis not present

## 2021-09-21 DIAGNOSIS — I700 Atherosclerosis of aorta: Secondary | ICD-10-CM

## 2021-09-21 DIAGNOSIS — D692 Other nonthrombocytopenic purpura: Secondary | ICD-10-CM

## 2021-09-21 DIAGNOSIS — E7800 Pure hypercholesterolemia, unspecified: Secondary | ICD-10-CM

## 2021-09-21 DIAGNOSIS — E559 Vitamin D deficiency, unspecified: Secondary | ICD-10-CM | POA: Diagnosis not present

## 2021-09-21 DIAGNOSIS — Z7185 Encounter for immunization safety counseling: Secondary | ICD-10-CM

## 2021-09-21 MED ORDER — LOSARTAN POTASSIUM 50 MG PO TABS: 50.0000 mg | ORAL_TABLET | Freq: Every day | ORAL | 1 refills | Status: DC

## 2021-09-22 LAB — COMPREHENSIVE METABOLIC PANEL
ALT: 13 IU/L (ref 0–32)
AST: 19 IU/L (ref 0–40)
Albumin/Globulin Ratio: 1.7 (ref 1.2–2.2)
Albumin: 4.5 g/dL (ref 3.7–4.7)
Alkaline Phosphatase: 113 IU/L (ref 44–121)
BUN/Creatinine Ratio: 15 (ref 12–28)
BUN: 12 mg/dL (ref 8–27)
Bilirubin Total: 0.5 mg/dL (ref 0.0–1.2)
CO2: 24 mmol/L (ref 20–29)
Calcium: 9.2 mg/dL (ref 8.7–10.3)
Chloride: 100 mmol/L (ref 96–106)
Creatinine, Ser: 0.78 mg/dL (ref 0.57–1.00)
Globulin, Total: 2.7 g/dL (ref 1.5–4.5)
Glucose: 102 mg/dL — ABNORMAL HIGH (ref 70–99)
Potassium: 4.8 mmol/L (ref 3.5–5.2)
Sodium: 137 mmol/L (ref 134–144)
Total Protein: 7.2 g/dL (ref 6.0–8.5)
eGFR: 80 mL/min/{1.73_m2} (ref >59)

## 2021-09-22 LAB — LIPID PANEL
Chol/HDL Ratio: 3.6 ratio (ref 0.0–4.4)
Cholesterol, Total: 206 mg/dL — ABNORMAL HIGH (ref 100–199)
HDL: 58 mg/dL (ref >39)
LDL Chol Calc (NIH): 127 mg/dL — ABNORMAL HIGH (ref 0–99)
Triglycerides: 118 mg/dL (ref 0–149)
VLDL Cholesterol Cal: 21 mg/dL (ref 5–40)

## 2021-09-22 LAB — VITAMIN D 25 HYDROXY (VIT D DEFICIENCY, FRACTURES): Vit D, 25-Hydroxy: 23.3 ng/mL — ABNORMAL LOW (ref 30.0–100.0)

## 2021-09-22 LAB — HEMOGLOBIN A1C
Est. average glucose Bld gHb Est-mCnc: 123 mg/dL
Hgb A1c MFr Bld: 5.9 % — ABNORMAL HIGH (ref 4.8–5.6)

## 2021-09-22 MED ORDER — SIMVASTATIN 20 MG PO TABS: 20.0000 mg | ORAL_TABLET | Freq: Every day | ORAL | 1 refills | Status: DC

## 2021-09-22 MED ORDER — VITAMIN D (ERGOCALCIFEROL) 1.25 MG (50000 UNIT) PO CAPS: 50000.0000 [IU] | ORAL_CAPSULE | ORAL | 0 refills | Status: DC

## 2021-12-11 ENCOUNTER — Other Ambulatory Visit: Payer: Self-pay | Admitting: *Deleted

## 2021-12-11 DIAGNOSIS — I10 Essential (primary) hypertension: Secondary | ICD-10-CM

## 2021-12-11 MED ORDER — LOSARTAN POTASSIUM 50 MG PO TABS: 50.0000 mg | ORAL_TABLET | Freq: Every day | ORAL | 0 refills | Status: DC

## 2021-12-12 ENCOUNTER — Other Ambulatory Visit: Payer: Self-pay | Admitting: Pulmonary Disease

## 2021-12-12 DIAGNOSIS — J449 Chronic obstructive pulmonary disease, unspecified: Secondary | ICD-10-CM

## 2022-01-08 ENCOUNTER — Ambulatory Visit: Payer: Medicare Other | Admitting: Pulmonary Disease

## 2022-01-08 ENCOUNTER — Ambulatory Visit (INDEPENDENT_AMBULATORY_CARE_PROVIDER_SITE_OTHER): Payer: Medicare Other

## 2022-01-08 ENCOUNTER — Encounter: Payer: Self-pay | Admitting: Pulmonary Disease

## 2022-01-08 ENCOUNTER — Other Ambulatory Visit: Payer: Self-pay

## 2022-01-08 VITALS — BP 130/68 | HR 83 | Temp 97.8°F | Ht 67.0 in | Wt 164.6 lb

## 2022-01-08 DIAGNOSIS — R079 Chest pain, unspecified: Secondary | ICD-10-CM | POA: Diagnosis not present

## 2022-01-08 DIAGNOSIS — J449 Chronic obstructive pulmonary disease, unspecified: Secondary | ICD-10-CM | POA: Diagnosis not present

## 2022-01-08 MED ORDER — FLUTICASONE PROPIONATE HFA 110 MCG/ACT IN AERO: 2.0000 | INHALATION_SPRAY | Freq: Two times a day (BID) | RESPIRATORY_TRACT | 6 refills | Status: DC

## 2022-01-08 MED ORDER — PROVENTIL HFA 108 (90 BASE) MCG/ACT IN AERS: INHALATION_SPRAY | RESPIRATORY_TRACT | 5 refills | Status: DC

## 2022-01-08 MED ORDER — PREDNISONE 10 MG PO TABS: 40.0000 mg | ORAL_TABLET | Freq: Every day | ORAL | 0 refills | Status: AC

## 2022-01-08 NOTE — Progress Notes (Signed)
Subjective:   [name] GENDER: female DOB: 11-Feb-1946, MRN: [id number]   HPI  Chief Complaint  Patient presents with   Follow-up    copd   Tiffany [name] is 76 year old female former smoker with severe COPD who presents for follow-up.  Synopsis: Well-controlled COPD on Stioloto however symptoms worsen over fall/winter. Trialed ICS addition however no improvement so discontinued. Last COPD exacerbation requiring hospitalization 08/2020. She is not intentionally active but will climb stairs for laundry.  01/08/22 Since our last visit, we added ICS but no improvement perceived by patient. She continues to have shortness of breath which is her primary issue. Denies wheezing or cough. No recent fever or chills. She has been using her husband's oxygen for shortness of breath that will abruptly occur. Last night after cooking she had midsternal chest dullness/tightness/pressure associated with shortness of breath for a few hours. She did not check her oxygen level but used oxygen with improvement. Denies any radiation of pain to jaw or arm. She had an episode similar to this a few weeks ago after cleaning her house.  Social History: Former smoker.  Quit 08/2018.  50 pack years Son lives in [place]  Past Medical History:  Diagnosis Date   Abnormal PFT 09/02/14   moderate restrictive ventilatory defect.   BCC (basal cell carcinoma), face 2010   Dr. Allyson Sabal   Colon polyp 2/05   Diverticulosis 11/2010   seen on colonoscopy and air contrast BE   Emphysema of lung ([place])    Hyperlipidemia    Hypertension    previously treated with meds, resolved   MGUS (monoclonal gammopathy of unknown significance) 1999   prevously under care of Dr. 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 90 tablet 0   simvastatin (ZOCOR) 20 MG tablet Take 1 tablet (20 mg total) by mouth daily. 90 tablet 1   Tiotropium Bromide-Olodaterol (STIOLTO RESPIMAT) 2.5-2.5 MCG/ACT AERS Inhale 2 puffs into the lungs daily. 4 g 11   fluticasone (FLOVENT HFA) 110 MCG/ACT inhaler Inhale 2 puffs into the lungs in the morning and at bedtime. 1 each 6   PROVENTIL HFA 108 (90 Base) MCG/ACT inhaler INHALE TWO PUFFS BY MOUTH EVERY 4 HOURS 6.7 g 5   Vitamin D, Ergocalciferol, (DRISDOL) 1.25 MG (50000 UNIT) CAPS capsule Take 1 capsule (50,000 Units total) by mouth every 7 (seven) days. (Patient not taking: Reported on 01/08/2022) 12 capsule 0   No facility-administered medications prior to visit.    Review of Systems  Constitutional:  Negative for chills, diaphoresis, fever, malaise/fatigue and weight loss.  HENT:  Negative for congestion.   Respiratory:  Positive for shortness of breath. Negative for cough, hemoptysis, sputum production and wheezing.   Cardiovascular:  Positive for chest pain. Negative for palpitations and leg swelling.   Objective:   Vitals:   01/08/22 1432  BP: 130/68  Pulse: 83  Temp: 97.8 F (36.6 C)  TempSrc: Oral  SpO2: 94%  Weight: 164 lb 9.6 oz (74.7 kg)  Height: 5\' 7"  (1.702 m)   SpO2: 94 % O2 Device: None (Room air)  Physical Exam: General:  Well-appearing, no acute distress HENT: , AT Eyes: EOMI, no scleral icterus Respiratory: Clear to auscultation bilaterally.  No crackles, wheezing or rales Cardiovascular: RRR, -M/R/G, no JVD Extremities:-Edema,-tenderness Neuro: AAO x4, CNII-XII grossly intact Psych: Normal mood, normal affect  Chest imaging: CXR 09/10/2018- hyperinflated lung fields.  No acute infiltrate/effusion or edema CT Chest Lung 01/12/19 - Emphysema, unchanged pulmonary nodules CT Chest Lung Screen 01/26/20 - Unchanged pulmonary nodules, larges 3.36mm in RML.  Background emphysema CT Chest Lung Screen 02/14/21 - Centrilobular and paraseptal emphysema. Biapical pleuroparenchymal scarring. Tiny pulmonary nodules, unchanged. CXR 01/08/21 - Increased coarse interstitial markings  PFT: 12/02/2017-severe obstructive defect. Ratio 58 FEV1 37 FVC 48  EKG 01/08/22 - No acute ST changes or TWI. Prior anterior infarct.    Assessment & Plan:  76 year old female with severe COPD with presents for follow-up. Last exacerbation 08/2020 however mMRC >2 due to persistent dyspnea despite triple inhaler therapy. Recent onset of chest pain with exertion. CXR with increased interstial markings but clinically no s/sx of pneumonia so no antibiotics but will treat for COPD exacerbation/inflammation. Ambulatory O2 with nadir O2 89%. Does not qualify for oxygen.   COPD exacerbation Severe COPD with emphysema, GOLD Class B - persistent symptoms --Prednisone 40 mg x 5 days --CONTINUE Flovent 110 TWO puffs TWICE a day. Will de-escalate in the future if symptoms improve --CONTINUE Stiolto Respimat TWO puffs ONCE a day --CONTINUE Albuterol as needed for shortness of breath or wheezing. REFILL  Atypical chest pain --REFER to Cardiology for evaluation  Health Maintenance Immunization History  Administered Date(s) Administered   Fluad Quad(high Dose 65+) 09/15/2019, 08/08/2020, 09/21/2021   Influenza Split 10/16/2012   Influenza, High Dose Seasonal PF 09/02/2013, 08/25/2014, 08/31/2015, 11/01/2016, 09/09/2017, 09/09/2018   PFIZER(Purple Top)SARS-COV-2 Vaccination 07/01/2020, 07/22/2020   Pneumococcal Conjugate-13 08/25/2014   Pneumococcal Polysaccharide-23 12/12/2009, 08/31/2015   Tdap 12/12/2009   CT Lung Screen - Due 01/2022  Orders Placed This Encounter  Procedures   DG Chest 2 View    Standing Status:   Future    Number of Occurrences:   1    Standing Expiration Date:   01/08/2023    Order Specific Question:   Reason for Exam (SYMPTOM  OR DIAGNOSIS REQUIRED)     Answer:   chest pain    Order Specific Question:   Preferred imaging location?    Answer:   Internal   EKG 12-Lead    Meds ordered this encounter  Medications   PROVENTIL HFA 108 (90 Base) MCG/ACT inhaler    Sig: INHALE TWO PUFFS BY MOUTH EVERY 4 HOURS    Dispense:  6.7 g    Refill:  5   predniSONE (DELTASONE) 10 MG tablet    Sig: Take 4 tablets (40 mg total) by mouth daily with breakfast for 5 days.    Dispense:  20 tablet    Refill:  0   fluticasone (FLOVENT HFA) 110 MCG/ACT inhaler    Sig: Inhale 2 puffs into the lungs in the morning and at bedtime.    Dispense:  1 each    Refill:  6    Return in about 3 months (around 04/07/2022).   I have spent a total time of 50-minutes on the day of the appointment reviewing prior documentation, coordinating care and discussing medical diagnosis and plan with the patient/family. Past medical history, allergies, medications were reviewed. Pertinent imaging, labs and tests included in this note have been reviewed and interpreted independently by me.   Rodman Pickle, MD  [facility] 01/08/2022 3:58 PM

## 2022-01-08 NOTE — Patient Instructions (Signed)
COPD exacerbation Severe COPD with emphysema, GOLD Class B - persistent symptoms --Prednisone 40 mg x 5 days --CONTINUE Flovent 110 TWO puffs TWICE a day. Will de-escalate in the future if symptoms improve --CONTINUE Stiolto Respimat TWO puffs ONCE a day --CONTINUE Albuterol as needed for shortness of breath or wheezing. REFILL  Atypical chest pain --REFER to Cardiology for evaluation  Follow-up with me in 3 months

## 2022-01-10 ENCOUNTER — Telehealth: Payer: Self-pay

## 2022-01-10 NOTE — Telephone Encounter (Signed)
NA

## 2022-01-10 NOTE — Telephone Encounter (Signed)
Called patient to offer appointment for next week.  Patient did not answer, LVM to call back to discuss.   Left call back number.  Thanks!    Tiffany [name], can you check on this as well for next week- 02/23  Thanks!

## 2022-01-10 NOTE — Telephone Encounter (Signed)
Patient is scheduled for 02/15/22 at 2pm with Dr. Audie Box

## 2022-01-10 NOTE — Telephone Encounter (Signed)
-----   Message from Geralynn Rile, MD sent at 01/10/2022  6:35 AM EST ----- Regarding: RE: New patient Mild coronary calcifications on chest CT. Great candidate for coronary CTA. I will get her in.   Almyra Free -> Let's get her into see me next available.   [place] T. Audie Box, MD, [place]  [address] [place], [place] [address] [phone]  6:35 AM  ----- Message ----- From: Margaretha Seeds, MD Sent: 01/08/2022   3:52 PM EST To: Geralynn Rile, MD Subject: New patient                                    Do you have availability for atypical chest pain in patient with severe COPD. EKG with no acute changes but maybe prior anterior infarct. May warrant stress vs Cardiac CT but will defer to you.  Opal Sidles

## 2022-01-10 NOTE — Telephone Encounter (Signed)
Thank you :)

## 2022-01-12 ENCOUNTER — Other Ambulatory Visit: Payer: Self-pay | Admitting: Pulmonary Disease

## 2022-01-12 DIAGNOSIS — J449 Chronic obstructive pulmonary disease, unspecified: Secondary | ICD-10-CM

## 2022-01-17 NOTE — Progress Notes (Signed)
Cardiology Office Note:   Date:  01/18/2022  NAME:  Tiffany [name]    MRN: [id number] DOB:  [birth year]   PCP:  Rita Ohara, MD  Cardiologist:  None  Electrophysiologist:  None   Referring MD: Rita Ohara, MD   Chief Complaint  Patient presents with   Chest Pain    History of Present Illness:   Tiffany [name] is a 76 y.o. female with a hx of severe COPD, HLD who is being seen today for the evaluation of chest pain at the request of Rita Ohara, MD. recently seen by pulmonologist.  Describes exertional chest tightness.  She is also short of breath with exertion.  She has severe COPD.  She has never had a heart attack or stroke.  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  Echo in 2018 was normal.  Denies any lower extremity edema or symptoms of congestive heart failure.  She is just limited by activity.  Her chest tightness occurs in the center of the chest.  Also occurs bilaterally.  Described as tightness.  Resolved with exertion.  Only occurs with exertion.  Not at rest.  Blood pressure today in office 106/64.  She takes losartan.  She is not diabetic.  She smoked for 59 years.  She quit several years ago.  She denies any drug use.  She is retired.  Worked for [employer].  She is retired.  She is married.  She has 1 son.  She has 1 grandson.  She is on simvastatin.  Most recent LDL cholesterol slightly elevated.  Recent CT scan with minimal coronary calcifications.  CT Lung CA Screening 02/15/2021 -> Minimal coronary calcifications   T chol 206, HDL 58, LDL 127, TG 118 A1c 5.9  Problem List COPD -severe   Past Medical History: Past Medical History:  Diagnosis Date   Abnormal PFT 09/02/14   moderate restrictive ventilatory defect.   BCC (basal cell carcinoma), face 2010   Dr. Allyson Sabal   Colon polyp 2/05   Diverticulosis 11/2010   seen on colonoscopy and air contrast BE   Emphysema of lung ([place])    Hyperlipidemia    Hypertension    previously treated with  meds, resolved   MGUS (monoclonal gammopathy of unknown significance) 1999   prevously under care of Dr. 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 Wynetta Emery. Diverticulosis. BE also done--normal   OMENTECTOMY  11/26/2005   and pelvic lymphadenectomy (with BSO)   SKIN CANCER EXCISION Left 07/2021   neck; pt can't recall type of cancer   TONSILLECTOMY  age 19   VAGINAL HYSTERECTOMY  late 20's    Current Medications: Current Meds  Medication Sig   fluticasone (FLOVENT HFA) 110 MCG/ACT inhaler Inhale 2 puffs into the lungs in the morning and at bedtime.   losartan (COZAAR) 50 MG tablet Take 1 tablet (50 mg total) by mouth daily.   metoprolol tartrate (LOPRESSOR) 100 MG tablet Take 1 tablet by mouth once for procedure.   PROVENTIL HFA 108 (90  Base) MCG/ACT inhaler INHALE TWO PUFFS BY MOUTH EVERY 4 HOURS   simvastatin (ZOCOR) 20 MG tablet Take 1 tablet (20 mg total) by mouth daily.   Tiotropium Bromide-Olodaterol (STIOLTO RESPIMAT) 2.5-2.5 MCG/ACT AERS Inhale 2 puffs into the lungs daily.   Vitamin D, Ergocalciferol, (DRISDOL) 1.25 MG (50000 UNIT) CAPS capsule Take 1 capsule (50,000 Units total) by mouth every 7 (seven) days.     Allergies:    Patient has no known allergies.   Social History: Social History   Socioeconomic History   Marital status: Married    Spouse name: Not on file   Number of children: 1   Years of education: Not on file   Highest education level: Not on file  Occupational History   Occupation: retired Building services engineer company)    Employer: RETIRED  Tobacco Use    Smoking status: Former    Packs/day: 2.00    Years: 59.00    Pack years: 118.00    Types: Cigarettes    Start date: 1960    Quit date: 09/10/2018    Years since quitting: 3.3   Smokeless tobacco: Never  Vaping Use   Vaping Use: Never used  Substance and Sexual Activity   Alcohol use: Yes    Alcohol/week: 0.0 standard drinks    Comment: rare beer, a few times/year   Drug use: No   Sexual activity: Yes    Partners: Male  Other Topics Concern   Not on file  Social History Narrative   Lives with husband, 1 cat.  Son lives with him during the week, stays with his girlfriend on the weekends (divorced).   62 grandson       Husband having open heart surgery 02/2021      Updated 02/2021   Social Determinants of Health   Financial Resource Strain: Not on file  Food Insecurity: No Food Insecurity   Worried About Charity fundraiser in the Last Year: Never true   Ran Out of Food in the Last Year: Never true  Transportation Needs: No Transportation Needs   Lack of Transportation (Medical): No   Lack of Transportation (Non-Medical): No  Physical Activity: Not on file  Stress: Not on file  Social Connections: Not on file     Family History: The patient's family history includes Breast cancer in her maternal aunt; Cerebral aneurysm in her mother; Diabetes in her maternal aunt, maternal aunt, and maternal aunt; Hypertension in her father; Lung cancer in her maternal uncle; Stroke in her mother; Suicidality in her father; Throat cancer in her maternal uncle.  ROS:   All other ROS reviewed and negative. Pertinent positives noted in the HPI.     EKGs/Labs/Other Studies Reviewed:   The following studies were personally reviewed by me today:  EKG:  EKG is ordered today.  The ekg ordered today demonstrates normal sinus rhythm heart rate 78, anteroseptal infarct, no acute ischemic changes, and was personally reviewed by me.   Recent Labs: 09/21/2021: ALT 13; BUN 12; Creatinine, Ser 0.78;  Potassium 4.8; Sodium 137   Recent Lipid Panel    Component Value Date/Time   CHOL 206 (H) 09/21/2021 1146   TRIG 118 09/21/2021 1146   HDL 58 09/21/2021 1146   CHOLHDL 3.6 09/21/2021 1146   CHOLHDL 3.7 12/02/2017 0945   VLDL 23 05/06/2017 1028   LDLCALC 127 (H) 09/21/2021 1146   LDLCALC 122 (H) 12/02/2017 0945    Physical Exam:   VS:  BP 106/64 (BP Location: Left  Arm, Patient Position: Sitting, Cuff Size: Normal)    Pulse 78    Ht 5\' 7"  (1.702 m)    Wt 162 lb (73.5 kg)    BMI 25.37 kg/m    Wt Readings from Last 3 Encounters:  01/18/22 162 lb (73.5 kg)  01/08/22 164 lb 9.6 oz (74.7 kg)  09/21/21 162 lb 12.8 oz (73.8 kg)    General: Well nourished, well developed, in no acute distress Head: Atraumatic, normal size  Eyes: PEERLA, EOMI  Neck: Supple, no JVD Endocrine: No thryomegaly Cardiac: Normal S1, S2; RRR; no murmurs, rubs, or gallops Lungs: Diminished breath sounds bilaterally Abd: Soft, nontender, no hepatomegaly  Ext: No edema, pulses 2+ Musculoskeletal: No deformities, BUE and BLE strength normal and equal Skin: Warm and dry, no rashes   Neuro: Alert and oriented to person, place, time, and situation, CNII-XII grossly intact, no focal deficits  Psych: Normal mood and affect   ASSESSMENT:   Tiffany [name] is a 76 y.o. female who presents for the following: 1. Precordial pain   2. SOB (shortness of breath)   3. Nonspecific abnormal electrocardiogram (ECG) (EKG)     PLAN:   1. Precordial pain 2. SOB (shortness of breath) 3. Nonspecific abnormal electrocardiogram (ECG) (EKG) -She presents with exertional chest tightness.  Has severe COPD.  Symptoms occur with exertion.  Alleviated by rest.  She has had the symptoms for years.  EKG shows old anteroseptal infarct.  CT lung cancer screening last year showed minimal coronary calcifications.  I believe her symptoms are likely just related to COPD.  To exclude coronary disease we will obtain a coronary CTA.  She will  take 100 mg of metoprolol tartrate 2 hours before the scan.  She will hold losartan the day of the scan.  Her EKG does demonstrate an old anteroseptal infarct but I believe this is artifact.  We will obtain an echocardiogram to make sure there is nothing new here.  She also will give [place] lab work.  A BNP will exclude heart failure.  She needs a BMP for her CTA.  We will notify her of results by phone.  She will see [place] as needed based on the results of her scan.  Disposition: Return if symptoms worsen or fail to improve.  Medication Adjustments/Labs and Tests Ordered: Current medicines are reviewed at length with the patient today.  Concerns regarding medicines are outlined above.  Orders Placed This Encounter  Procedures   CT CORONARY MORPH W/CTA COR W/SCORE W/CA W/CM &/OR WO/CM   Basic metabolic panel   Brain natriuretic peptide   EKG 12-Lead   ECHOCARDIOGRAM COMPLETE   Meds ordered this encounter  Medications   metoprolol tartrate (LOPRESSOR) 100 MG tablet    Sig: Take 1 tablet by mouth once for procedure.    Dispense:  1 tablet    Refill:  0    Patient Instructions  Medication Instructions:  Take Metoprolol 100 mg two hours before CT when scheduled.  Hold Losartan the day of your CT scan when scheduled.   *If you need a refill on your cardiac medications before your next appointment, please call your pharmacy*   Lab Work: BMET, BNP today    If you have labs (blood work) drawn today and your tests are completely normal, you will receive your results only by: [place] (if you have MyChart) OR A paper copy in the mail If you have any lab test that is abnormal or we need to change  your treatment, we will call you to review the results.   Testing/Procedures: Echocardiogram - Your physician has requested that you have an echocardiogram. Echocardiography is a painless test that uses sound waves to create images of your heart. It provides your doctor with information about  the size and shape of your heart and how well your hearts chambers and valves are working. This procedure takes approximately one hour. There are no restrictions for this procedure. This will be performed at either our [facility] location - [address] [place] location [facility] 2nd floor.  Your physician has requested that you have cardiac CT. Cardiac computed tomography (CT) is a painless test that uses an x-ray machine to take clear, detailed pictures of your heart. For further information please visit [email or web address]. Please follow instruction sheet as given.    Follow-Up: At [facility], you and your health needs are our priority.  As part of our continuing mission to provide you with exceptional heart care, we have created designated Provider Care Teams.  These Care Teams include your primary Cardiologist (physician) and Advanced Practice Providers (APPs -  Physician Assistants and Nurse Practitioners) who all work together to provide you with the care you need, when you need it.  We recommend signing up for the patient portal called "MyChart".  Sign up information is provided on this After Visit Summary.  MyChart is used to connect with patients for Virtual Visits (Telemedicine).  Patients are able to view lab/test results, encounter notes, upcoming appointments, etc.  Non-urgent messages can be sent to your provider as well.   To learn more about what you can do with MyChart, go to [email or web address].    Your next appointment:   As needed  The format for your next appointment:   In Person  Provider:   Eleonore Chiquito, MD    Other Instructions   Your cardiac CT will be scheduled at one of the below locations:   [facility] [address] [place], [place] [address] [phone]  If scheduled at [facility], please arrive at the [facility] main entrance (entrance A) of [facility] 30 minutes prior to  test start time. You can use the FREE valet parking offered at the main entrance (encouraged to control the heart rate for the test) Proceed to the [facility] Radiology Department (first floor) to check-in and test prep.  Please follow these instructions carefully (unless otherwise directed):   On the Night Before the Test: Be sure to Drink plenty of water. Do not consume any caffeinated/decaffeinated beverages or chocolate 12 hours prior to your test. Do not take any antihistamines 12 hours prior to your test.  On the Day of the Test: Drink plenty of water until 1 hour prior to the test. Do not eat any food 4 hours prior to the test. You may take your regular medications prior to the test.  Take metoprolol (Lopressor) two hours prior to test.- HOLD Losartan the day of. HOLD Furosemide/Hydrochlorothiazide morning of the test. FEMALES- please wear underwire-free bra if available, avoid dresses & tight clothing  After the Test: Drink plenty of water. After receiving IV contrast, you may experience a mild flushed feeling. This is normal. On occasion, you may experience a mild rash up to 24 hours after the test. This is not dangerous. If this occurs, you can take Benadryl 25 mg and increase your fluid intake. If you experience trouble breathing, this can be serious.  If it is severe call 911 IMMEDIATELY. If it is mild, please call our office. If you take any of these medications: Glipizide/Metformin, Avandament, Glucavance, please do not take 48 hours after completing test unless otherwise instructed.  We will call to schedule your test 2-4 weeks out understanding that some insurance companies will need an authorization prior to the service being performed.   For non-scheduling related questions, please contact the cardiac imaging nurse navigator should you have any questions/concerns: Marchia Bond, Cardiac Imaging Nurse Navigator Gordy Clement, Cardiac Imaging Nurse Navigator [place]  [facility] Direct Office Dial: [phone]   For scheduling needs, including cancellations and rescheduling, please call [place], [phone].     Signed, Addison Naegeli. Audie Box, MD, [place]  [address] [place], [place] [address] [phone]  01/18/2022 2:35 PM

## 2022-01-18 ENCOUNTER — Other Ambulatory Visit: Payer: Self-pay

## 2022-01-18 ENCOUNTER — Encounter: Payer: Self-pay | Admitting: Cardiovascular Disease

## 2022-01-18 ENCOUNTER — Ambulatory Visit: Payer: Medicare Other | Admitting: Cardiovascular Disease

## 2022-01-18 VITALS — BP 106/64 | HR 78 | Ht 67.0 in | Wt 162.0 lb

## 2022-01-18 DIAGNOSIS — R0602 Shortness of breath: Secondary | ICD-10-CM | POA: Diagnosis not present

## 2022-01-18 DIAGNOSIS — R072 Precordial pain: Secondary | ICD-10-CM

## 2022-01-18 DIAGNOSIS — R9431 Abnormal electrocardiogram [ECG] [EKG]: Secondary | ICD-10-CM

## 2022-01-18 MED ORDER — METOPROLOL TARTRATE 100 MG PO TABS: ORAL_TABLET | ORAL | 0 refills | Status: DC

## 2022-01-18 NOTE — Patient Instructions (Addendum)
Medication Instructions:  Take Metoprolol 100 mg two hours before CT when scheduled.  Hold Losartan the day of your CT scan when scheduled.   *If you need a refill on your cardiac medications before your next appointment, please call your pharmacy*   Lab Work: BMET, BNP today    If you have labs (blood work) drawn today and your tests are completely normal, you will receive your results only by: [place] (if you have MyChart) OR A paper copy in the mail If you have any lab test that is abnormal or we need to change your treatment, we will call you to review the results.   Testing/Procedures: Echocardiogram - Your physician has requested that you have an echocardiogram. Echocardiography is a painless test that uses sound waves to create images of your heart. It provides your doctor with information about the size and shape of your heart and how well your hearts chambers and valves are working. This procedure takes approximately one hour. There are no restrictions for this procedure. This will be performed at either our [facility] location - [address] [place] location [facility] 2nd floor.  Your physician has requested that you have cardiac CT. Cardiac computed tomography (CT) is a painless test that uses an x-ray machine to take clear, detailed pictures of your heart. For further information please visit [email or web address]. Please follow instruction sheet as given.    Follow-Up: At [facility], you and your health needs are our priority.  As part of our continuing mission to provide you with exceptional heart care, we have created designated Provider Care Teams.  These Care Teams include your primary Cardiologist (physician) and Advanced Practice Providers (APPs -  Physician Assistants and Nurse Practitioners) who all work together to provide you with the care you need, when you need it.  We recommend signing up for the patient portal called  "MyChart".  Sign up information is provided on this After Visit Summary.  MyChart is used to connect with patients for Virtual Visits (Telemedicine).  Patients are able to view lab/test results, encounter notes, upcoming appointments, etc.  Non-urgent messages can be sent to your provider as well.   To learn more about what you can do with MyChart, go to [email or web address].    Your next appointment:   As needed  The format for your next appointment:   In Person  Provider:   Eleonore Chiquito, MD    Other Instructions   Your cardiac CT will be scheduled at one of the below locations:   [facility] [address] [place], [place] [address] [phone]  If scheduled at [facility], please arrive at the [facility] main entrance (entrance A) of [facility] 30 minutes prior to test start time. You can use the FREE valet parking offered at the main entrance (encouraged to control the heart rate for the test) Proceed to the [facility] Radiology Department (first floor) to check-in and test prep.  Please follow these instructions carefully (unless otherwise directed):   On the Night Before the Test: Be sure to Drink plenty of water. Do not consume any caffeinated/decaffeinated beverages or chocolate 12 hours prior to your test. Do not take any antihistamines 12 hours prior to your test.  On the Day of the Test: Drink plenty of water until 1 hour prior to the test. Do not eat any food 4 hours prior to the test. You may take your regular medications  prior to the test.  Take metoprolol (Lopressor) two hours prior to test.- HOLD Losartan the day of. HOLD Furosemide/Hydrochlorothiazide morning of the test. FEMALES- please wear underwire-free bra if available, avoid dresses & tight clothing  After the Test: Drink plenty of water. After receiving IV contrast, you may experience a mild flushed feeling. This is normal. On occasion, you may experience a  mild rash up to 24 hours after the test. This is not dangerous. If this occurs, you can take Benadryl 25 mg and increase your fluid intake. If you experience trouble breathing, this can be serious. If it is severe call 911 IMMEDIATELY. If it is mild, please call our office. If you take any of these medications: Glipizide/Metformin, Avandament, Glucavance, please do not take 48 hours after completing test unless otherwise instructed.  We will call to schedule your test 2-4 weeks out understanding that some insurance companies will need an authorization prior to the service being performed.   For non-scheduling related questions, please contact the cardiac imaging nurse navigator should you have any questions/concerns: Marchia Bond, Cardiac Imaging Nurse Navigator Gordy Clement, Cardiac Imaging Nurse Navigator [place] [facility] Direct Office Dial: [phone]   For scheduling needs, including cancellations and rescheduling, please call [place], [phone].

## 2022-01-19 LAB — BASIC METABOLIC PANEL
BUN/Creatinine Ratio: 19 (ref 12–28)
BUN: 18 mg/dL (ref 8–27)
CO2: 27 mmol/L (ref 20–29)
Calcium: 9.1 mg/dL (ref 8.7–10.3)
Chloride: 95 mmol/L — ABNORMAL LOW (ref 96–106)
Creatinine, Ser: 0.96 mg/dL (ref 0.57–1.00)
Glucose: 100 mg/dL — ABNORMAL HIGH (ref 70–99)
Potassium: 4.2 mmol/L (ref 3.5–5.2)
Sodium: 135 mmol/L (ref 134–144)
eGFR: 62 mL/min/{1.73_m2} (ref >59)

## 2022-01-19 LAB — BRAIN NATRIURETIC PEPTIDE: BNP: 6.9 pg/mL (ref 0.0–100.0)

## 2022-01-25 ENCOUNTER — Telehealth: Payer: Self-pay | Admitting: Pulmonary Disease

## 2022-01-25 DIAGNOSIS — J449 Chronic obstructive pulmonary disease, unspecified: Secondary | ICD-10-CM

## 2022-01-26 MED ORDER — PROVENTIL HFA 108 (90 BASE) MCG/ACT IN AERS: INHALATION_SPRAY | RESPIRATORY_TRACT | 5 refills | Status: DC

## 2022-01-26 NOTE — Telephone Encounter (Signed)
Called and spoke with patient. She stated that [facility] did not receive the prescription for the albuterol on 01/08/22. I advised her that I would go ahead and send in another refill for her. She verbalized understanding.  ? ?Nothing further needed at time of call.  ?

## 2022-01-30 ENCOUNTER — Telehealth (HOSPITAL_COMMUNITY): Payer: Self-pay | Admitting: Emergency Medicine

## 2022-01-30 NOTE — Telephone Encounter (Signed)
Reaching out to patient to offer assistance regarding upcoming cardiac imaging study; pt verbalizes understanding of appt date/time, parking situation and where to check in, pre-test NPO status and medications ordered, and verified current allergies; name and call back number provided for further questions should they arise ?Marchia Bond RN Navigator Cardiac Imaging ?[place] [facility] ?[phone] office ?[phone] cell ? ?'100mg'$  metoprolol holding losartan ?Difficult iv  ?Arrival 1100 ?

## 2022-01-31 ENCOUNTER — Other Ambulatory Visit: Payer: Self-pay

## 2022-01-31 ENCOUNTER — Encounter (HOSPITAL_COMMUNITY): Payer: Self-pay

## 2022-01-31 ENCOUNTER — Ambulatory Visit (HOSPITAL_COMMUNITY)
Admission: RE | Admit: 2022-01-31 | Discharge: 2022-01-31 | Disposition: A | Discharge status: Home / Self Care | Payer: Medicare Other | Source: Ambulatory Visit | Attending: Cardiovascular Disease | Admitting: Cardiovascular Disease

## 2022-01-31 ENCOUNTER — Ambulatory Visit (HOSPITAL_BASED_OUTPATIENT_CLINIC_OR_DEPARTMENT_OTHER): Payer: Medicare Other

## 2022-01-31 DIAGNOSIS — R072 Precordial pain: Secondary | ICD-10-CM

## 2022-01-31 DIAGNOSIS — R079 Chest pain, unspecified: Secondary | ICD-10-CM | POA: Diagnosis not present

## 2022-01-31 LAB — ECHOCARDIOGRAM COMPLETE
Area-P 1/2: 3.77 cm2
S' Lateral: 2.2 cm

## 2022-01-31 MED ORDER — IOHEXOL 350 MG/ML SOLN
95.0000 mL | Freq: Once | INTRAVENOUS | Status: AC | PRN
  Administered 2022-01-31: 95 mL via INTRAVENOUS

## 2022-01-31 MED ORDER — NITROGLYCERIN 0.4 MG SL SUBL: 0.8000 mg | SUBLINGUAL_TABLET | Freq: Once | SUBLINGUAL | Status: AC

## 2022-01-31 MED ORDER — NITROGLYCERIN 0.4 MG SL SUBL
SUBLINGUAL_TABLET | SUBLINGUAL | Status: AC
  Administered 2022-01-31: 0.8 mg via SUBLINGUAL
  Filled 2022-01-31: qty 2

## 2022-02-13 ENCOUNTER — Telehealth: Payer: Self-pay | Admitting: Pulmonary Disease

## 2022-02-13 DIAGNOSIS — J449 Chronic obstructive pulmonary disease, unspecified: Secondary | ICD-10-CM

## 2022-02-13 MED ORDER — ALBUTEROL SULFATE HFA 108 (90 BASE) MCG/ACT IN AERS: 2.0000 | INHALATION_SPRAY | Freq: Four times a day (QID) | RESPIRATORY_TRACT | 2 refills | Status: DC | PRN

## 2022-02-13 NOTE — Telephone Encounter (Signed)
Ventolin sent to preferred pharmacy per patient request.  ?Pt is aware and voiced his understanding.  ?Nothing further needed.  ? ?

## 2022-02-14 ENCOUNTER — Other Ambulatory Visit: Payer: Self-pay

## 2022-02-14 MED ORDER — ROSUVASTATIN CALCIUM 10 MG PO TABS: 10.0000 mg | ORAL_TABLET | Freq: Every day | ORAL | 3 refills | Status: DC

## 2022-02-15 DIAGNOSIS — H524 Presbyopia: Secondary | ICD-10-CM | POA: Diagnosis not present

## 2022-02-20 ENCOUNTER — Other Ambulatory Visit: Payer: Self-pay | Admitting: *Deleted

## 2022-02-21 NOTE — Patient Outreach (Signed)
[facility]) Care Management ?RN Health Coach Note ? ? ?02/21/2022 ?Name:  Tiffany [name] MRN:  [id number] DOB:  01/27/1946 ? ?Summary: ?No COPD exacerbation admissions since last outreach. Patient is using inhalers as per ordered. Patient stated that when she gets real short of breath she uses her husband oxygen. Patient stated that her O2 sat was 92%.  Her appetite is good. She monitors for triggers of COPD exacerbation.  ?Recommendations/Changes made from today's visit: ?Monitor for symptoms ?Medication adherence ? ? ?Subjective: ?Tiffany [name] is an 76 y.o. year old female who is a primary patient of Rita Ohara, MD. The care management team was consulted for assistance with care management and/or care coordination needs.   ? ?RN Health Coach completed Telephone Visit today.  ? ?Objective: ? ?Medications Reviewed Today   ? ? Reviewed by Geralynn Rile, MD (Physician) on 01/18/22 at 1414  Med List Status: <None>  ? ?Medication Order Taking? Sig Documenting Provider Last Dose Status Informant  ?fluticasone (FLOVENT HFA) 110 MCG/ACT inhaler [id number] Yes Inhale 2 puffs into the lungs in the morning and at bedtime. Margaretha Seeds, MD Taking Active   ?losartan (COZAAR) 50 MG tablet [id number] Yes Take 1 tablet (50 mg total) by mouth daily. Rita Ohara, MD Taking Active   ?PROVENTIL HFA 108 (90 Base) MCG/ACT inhaler [id number] Yes INHALE TWO PUFFS BY MOUTH EVERY 4 HOURS Margaretha Seeds, MD Taking Active   ?simvastatin (ZOCOR) 20 MG tablet [id number] Yes Take 1 tablet (20 mg total) by mouth daily. Rita Ohara, MD Taking Active   ?Tiotropium Bromide-Olodaterol (STIOLTO RESPIMAT) 2.5-2.5 MCG/ACT AERS [id number] Yes Inhale 2 puffs into the lungs daily. Margaretha Seeds, MD Taking Active   ?Vitamin D, Ergocalciferol, (DRISDOL) 1.25 MG (50000 UNIT) CAPS capsule [id number] Yes Take 1 capsule (50,000 Units total) by mouth every 7 (seven) days. Rita Ohara, MD Taking Active   ? ?  ?  ? ?  ? ? ? ?SDOH:   (Social Determinants of Health) assessments and interventions performed:  ?SDOH Interventions   ? ?Flowsheet Row Most Recent Value  ?SDOH Interventions   ?Food Insecurity Interventions Intervention Not Indicated  ?Housing Interventions Intervention Not Indicated  ?Transportation Interventions Intervention Not Indicated  ? ?  ? ? ?Care Plan ? ?Review of patient past medical history, allergies, medications, health status, including review of consultants reports, laboratory and other test data, was performed as part of comprehensive evaluation for care management services.  ? ?Care Plan : COPD (Adult)  ?Updates made by , Eppie Gibson, RN since 02/21/2022 12:00 AM  ?  ? ?Problem: Disease Progression (COPD) Resolved 02/21/2022  ?Priority: High  ?Onset Date: 09/20/2020  ?Note:   ?[id number] Resolving due to duplicate goal ? ?  ? ?Long-Range Goal: Disease Progression Minimized or Managed Completed 02/21/2022  ?Start Date: 09/20/2020  ?Expected End Date: 12/25/2021  ?Recent Progress: On track  ?Priority: High  ?  ? ?Task: Alleviate Barriers to COPD Management Completed 02/21/2022  ?Due Date: 12/25/2021  ?Priority: Routine  ?Note:   ?Care Management Activities:  ?  ?- breathing techniques encouraged ?- medication-adherence assessment completed ?- rescue (action) plan reviewed ?- self-awareness of symptom triggers encouraged  ?  ?Notes:  ?[id number] Patient is monitoring for her triggers. She is following up with her symptoms to control COPD exacerbation ?  ? ?Problem: Symptom Exacerbation (COPD) Resolved 02/21/2022  ?Priority: High  ?Onset Date: 09/20/2020  ?Note:   ?[id number] Resolving due to duplicate goal ? ?  ? ?  Long-Range Goal: Symptom Exacerbation Prevented or Minimized Completed 02/21/2022  ?Start Date: 09/20/2020  ?Expected End Date: 12/25/2021  ?Recent Progress: On track  ?Priority: High  ?  ? ?Task: Identify and Minimize Risk of COPD Exacerbation Completed 02/21/2022  ?Due Date: 12/25/2021  ?Priority: Routine  ?Note:   ?Care  Management Activities:  ?  ?- rescue (action) plan reviewed ?- signs/symptoms of infection reviewed ?- signs/symptoms of worsening disease assessed ?- treatment plan reviewed  ?  ?Notes: Discussed and encouraged importance of follow up visit  ?  ? ?Care Plan : RN Care Manager Plan of Care  ?Updates made by , Eppie Gibson, RN since 02/21/2022 12:00 AM  ?  ? ?Problem: Knowledge [place] Related to COPD and Care Coordination Needs   ?Priority: High  ?  ? ?Long-Range Goal: Development Planof Care for Management of COPD   ?Start Date: 02/21/2022  ?Expected End Date: 11/24/2022  ?Priority: High  ?Note:   ?Current Barriers:  ?Knowledge Deficits related to plan of care for management of COPD  ? ?RNCM Clinical Goal(s):  ?Patient will verbalize understanding of plan for management of COPD as evidenced by Monitoring for symptoms of COPD exacerbation  through collaboration with RN Care manager, provider, and care team.  ? ?Interventions: ?Inter-disciplinary care team collaboration (see longitudinal plan of care) ?Evaluation of current treatment plan related to  self management and patient's adherence to plan as established by provider ? ? ?COPD Interventions:  (Status:  Goal on track:  Yes.) Long Term Goal ?Provided patient with basic written and verbal COPD education on self care/management/and exacerbation prevention ?Advised patient to track and manage COPD triggers ?Advised patient to self assesses COPD action plan zone and make appointment with provider if in the yellow zone for 48 hours without improvement ?Advised patient to engage in light exercise as tolerated 3-5 days a week to aid in the the management of COPD ?Provided education about and advised patient to utilize infection prevention strategies to reduce risk of respiratory infection ?Discussed the importance of adequate rest and management of fatigue with COPD ? ?Patient Goals/Self-Care Activities: ?Take all medications as prescribed ?Attend all scheduled  provider appointments ?Call pharmacy for medication refills 3-7 days in advance of running out of medications ?Perform all self care activities independently  ?Perform IADL's (shopping, preparing meals, housekeeping, managing finances) independently ?Call provider office for new concerns or questions  ?call the Suicide and Crisis Lifeline: 988 if experiencing a Mental Health or [place]  ?identify and remove indoor air pollutants ?limit outdoor activity during cold weather ?listen for public air quality announcements every day ?eliminate symptom triggers at home ?follow rescue plan if symptoms flare-up ?get at least 7 to 8 hours of sleep at night ? ?Follow Up Plan:  Telephone follow up appointment with care management team member scheduled for:  May 14, 2022 ?The patient has been provided with contact information for the care management team and has been advised to call with any health related questions or concerns.   ? ?[id number] Per patient her O2 sensor reads 92% on room air. Patient stated she is using her inhalers as per ordered. Patient stated that sometimes when she feels short of breath she uses her husband oxygen. No COPD exacerbations since last outreach.  ?  ?  ? ?Plan: Telephone follow up appointment with care management team member scheduled for:  May 14, 2022 ?The patient has been provided with contact information for the care management team and has been advised to call  with any health related questions or concerns.  ? ?[name] BSN RN ?[place] [facility] ?[phone] ? ? ? ?  ?

## 2022-02-21 NOTE — Patient Instructions (Signed)
Visit Information ? ?Thank you for taking time to visit with me today. Please don't hesitate to contact me if I can be of assistance to you before our next scheduled telephone appointment. ? ?Following are the goals we discussed today:  ?Current Barriers:  ?Knowledge Deficits related to plan of care for management of COPD  ? ?RNCM Clinical Goal(s):  ?Patient will verbalize understanding of plan for management of COPD as evidenced by Monitoring for symptoms of COPD exacerbation through collaboration with RN Care manager, provider, and care team.  ? ?Interventions: ?Inter-disciplinary care team collaboration (see longitudinal plan of care) ?Evaluation of current treatment plan related to  self management and patient's adherence to plan as established by provider ? ? ?COPD Interventions:  (Status:  Goal on track:  Yes.) Long Term Goal ?Provided patient with basic written and verbal COPD education on self care/management/and exacerbation prevention ?Advised patient to track and manage COPD triggers ?Advised patient to self assesses COPD action plan zone and make appointment with provider if in the yellow zone for 48 hours without improvement ?Advised patient to engage in light exercise as tolerated 3-5 days a week to aid in the the management of COPD ?Provided education about and advised patient to utilize infection prevention strategies to reduce risk of respiratory infection ?Discussed the importance of adequate rest and management of fatigue with COPD ? ?Patient Goals/Self-Care Activities: ?Take all medications as prescribed ?Attend all scheduled provider appointments ?Call pharmacy for medication refills 3-7 days in advance of running out of medications ?Perform all self care activities independently  ?Perform IADL's (shopping, preparing meals, housekeeping, managing finances) independently ?Call provider office for new concerns or questions  ?call the Suicide and Crisis Lifeline: 988 if experiencing a Mental Health  or [place]  ?identify and remove indoor air pollutants ?limit outdoor activity during cold weather ?listen for public air quality announcements every day ?eliminate symptom triggers at home ?follow rescue plan if symptoms flare-up ?get at least 7 to 8 hours of sleep at night ? ?Follow Up Plan:  Telephone follow up appointment with care management team member scheduled for:  May 14, 2022 ?The patient has been provided with contact information for the care management team and has been advised to call with any health related questions or concerns.   ? ?[id number] Per patient her O2 sensor reads 92% on room air. Patient stated she is using her inhalers as per ordered. Patient stated that sometimes when she feels short of breath she uses her husband oxygen. No COPD exacerbations since last outreach.  ? ?Our next appointment is by telephone on May 14, 2022 ? ?Please call [name] RN [phone] if you need to cancel or reschedule your appointment.  ? ?Please call the Suicide and Crisis Lifeline: 988 if you are experiencing a Mental Health or [place] or need someone to talk to. ? ?The patient verbalized understanding of instructions, educational materials, and care plan provided today and agreed to receive a mailed copy of patient instructions, educational materials, and care plan.  ? ?Telephone follow up appointment with care management team member scheduled for: ?The patient has been provided with contact information for the care management team and has been advised to call with any health related questions or concerns.  ? ?SIGNATURE ? ?[name] BSN RN ?[facility] ?[phone] ? ? ?  ?

## 2022-02-24 DIAGNOSIS — C44629 Squamous cell carcinoma of skin of left upper limb, including shoulder: Secondary | ICD-10-CM

## 2022-02-24 HISTORY — DX: Squamous cell carcinoma of skin of left upper limb, including shoulder: C44.629

## 2022-03-16 DIAGNOSIS — C44629 Squamous cell carcinoma of skin of left upper limb, including shoulder: Secondary | ICD-10-CM | POA: Diagnosis not present

## 2022-03-16 DIAGNOSIS — D485 Neoplasm of uncertain behavior of skin: Secondary | ICD-10-CM | POA: Diagnosis not present

## 2022-03-27 NOTE — Progress Notes (Signed)
?Chief Complaint  ?Patient presents with  ? Annual Exam  ?  Nonfasting annual exam with pelvic. Patient states she could not fast this late, she will come back Friday am. She does not want covid booster, and did not have Tdap or Shingles vaccine at pharmacy. Started rosuvastatin 02/14/22 when rx'd/ She is not currently taking any vitamin D. No new concerns.   ? ? ?Tiffany [name] is a 76 y.o. female who presents for a complete physical and fasting labs.  She is scheduled for AWV later this month. ? ?Hypertension. She is compliant with taking losartan '50mg'$ . She denies side effects, headaches, palpitations, edema, shortness of breath (other than from COPD).   ?Hasn't been checking BP at home recently. ?BP Readings from Last 3 Encounters:  ?03/28/22 134/72  ?01/31/22 (!) 107/55  ?01/18/22 106/64  ? ?She had seen cardiology in 12/2021 due to [place]. She had coronary CTA, echo and BNP (normal). ?Scan results: ?IMPRESSION: ?1. Coronary calcium score of 46. This was 51st percentile for age ?and sex matched control. ?2. Normal coronary origin with right dominance. ?3. 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 Minimal non-obstructive CAD (0-24%). Consider ?non-atherosclerotic causes of chest pain. Consider preventive ?therapy and risk factor modification. ? ?Echo 01/2022: ?IMPRESSIONS  ? 1. Left ventricular ejection fraction, by estimation, is 60 to 65%. The  ?left ventricle has normal function. The left ventricle has no regional  ?wall motion abnormalities. Left ventricular diastolic parameters were  ?normal.  ? 2. Right ventricular systolic function is normal. The right ventricular  ?size is normal.  ? 3. The mitral valve is normal in structure. Mild mitral valve  ?regurgitation. No evidence of mitral stenosis. 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  ? 4. The aortic valve is  tricuspid. Aortic valve regurgitation is not  ?visualized. No aortic stenosis is present.  ? 5. The inferior vena cava is normal in size with greater than 50%  ?respiratory variability, suggesting right atrial pressure of 3 mmHg.  ? ?  ?Hyperlipidemia: She previously took simvastatin, as she was noted to have aortic atherosclerosis on CT for lung cancer screening (and calcified atherosclerotic plaque in RCA).  As reported above, she saw the cardiologist and had further studies done. ?It appears that Dr. Audie Box changed her simvastatin to rosuvastatin '10mg'$  in 01/2022. ?She denies any side effect. No follow-up labs are scheduled. ? ?At last check had some noncompliance (was caring for her husband, wasn't as good about remembering her own meds). LDL had been lower on simvastatin on prior checks. ?Reports compliance with medications now (still caring for husband, had surgery for kidney stones last month). ? ?Lab Results  ?Component Value Date  ? CHOL 206 (H) 09/21/2021  ? CHOL 164 08/08/2020  ? CHOL 146 06/22/2019  ? ?Lab Results  ?Component Value Date  ? HDL 58 09/21/2021  ? HDL 57 08/08/2020  ? HDL 54 06/22/2019  ? ?Lab Results  ?Component Value Date  ? LDLCALC 127 (H) 09/21/2021  ? [place] 88 08/08/2020  ? [place] 75 06/22/2019  ? ?Lab Results  ?Component Value Date  ? TRIG 118 09/21/2021  ? TRIG 104 08/08/2020  ? TRIG 86 06/22/2019  ? ?Lab Results  ?Component Value Date  ? CHOLHDL 3.6 09/21/2021  ? CHOLHDL 2.9 08/08/2020  ? CHOLHDL 2.7 06/22/2019  ?  ?  ?Osteopenia--She previously took bisphosphonates (changed from fosamax (  due to nausea), then both Actonel and Boniva, which she tolerated but stopped due to cost issues. She stopped it without contacting us, possibly had some heartburn. She hadn't been taking any calcium, and has h/o vitamin D deficiency (see below).   ?She still isn't taking any calcium. ?She does some home exercises for her back, but not getting any regular weight-bearing exercise.  ?She quit smoking  08/2018.   ?DEXA was rechecked 03/2020, and was stable (T-2.1 at left fem neck). Encouraged adequate Ca, D and weight-bearing exercise, and repeat in 2 years. She is scheduled for this later this month. ?  ?Vitamin D deficiency:  Last level was 23.3 in 08/2021, had been 24.6 in 02/2021. She hadn't taken any supplements since completing rx course (she was supposed to start OTC D3 (5000 IU) upon finishing the prescription). She admits she isn't taking any OTC supplements. ? ?COPD--She quit smoking 09/10/18. Breathing has improved, still has some DOE walking up hill with groceries. Also gets short-winded when she is up and down the stairs to her laundry room in the basement. She remains under the care of pulmonary, who has prescribed Flovent and Stiolto.. She has f/u soon.  She has been out of [employer].  She used to only need albuterol 1-2x/day, but now needs it 2-3x/day. It always helps. ?She gets yearly lung cancer screening CT's, last 01/2021, stable. ?  ?  ?Pre-diabetes:  Last A1c was 5.9% in October, fasting glu was 102. For a while, she had been having a hard candy after a meal (instead of a cigarette). She still has peppermints after meals (not sugar-free).  She tries to limit her sweets. Not a lot of bread, +potatoes. Eats low sodium potato cihps. ? ?Lab Results  ?Component Value Date  ? HGBA1C 5.9 (H) 09/21/2021  ? ? ? ?Immunization History  ?Administered Date(s) Administered  ? Fluad Quad(high Dose 65+) 09/15/2019, 08/08/2020, 09/21/2021  ? Influenza Split 10/16/2012  ? Influenza, High Dose Seasonal PF 09/02/2013, 08/25/2014, 08/31/2015, 11/01/2016, 09/09/2017, 09/09/2018  ? PFIZER(Purple Top)SARS-COV-2 Vaccination 07/01/2020, 07/22/2020  ? Pneumococcal Conjugate-13 08/25/2014  ? Pneumococcal Polysaccharide-23 12/12/2009, 08/31/2015  ? Tdap 12/12/2009  ? ?Last Pap smear: s/p hyst in 20's   ?Last mammogram: 04/2021 ?Last colonoscopy: 11/2010 Dr. Wynetta Emery. She got a letter a couple of years ago stating she was due,  but didn't go. She declines repeating colonoscopy (unless she has symptoms). ?Last DEXA: 03/2020 T-2.1 L femoral neck; scheduled for later this month ?Dentist: doesn't go, has dentures   ?Ophtho: yearly ?Exercise: "not much" ? ? ?PMH, PSH, SH and FH reviewed ? ?Outpatient Encounter Medications as of 03/28/2022  ?Medication Sig Note  ? albuterol (VENTOLIN HFA) 108 (90 Base) MCG/ACT inhaler Inhale 2 puffs into the lungs every 6 (six) hours as needed for wheezing or shortness of breath. 03/28/2022: Uses it a few times/day  ? Coenzyme Q10 (COQ10) 100 MG CAPS Take 1 capsule by mouth daily.   ? losartan (COZAAR) 50 MG tablet Take 1 tablet (50 mg total) by mouth daily.   ? rosuvastatin (CRESTOR) 10 MG tablet Take 1 tablet (10 mg total) by mouth daily. 03/28/2022: Started when rx'd  ? [DISCONTINUED] metoprolol tartrate (LOPRESSOR) 100 MG tablet Take 1 tablet by mouth once for procedure.   ? fluticasone (FLOVENT HFA) 110 MCG/ACT inhaler Inhale 2 puffs into the lungs in the morning and at bedtime. (Patient not taking: Reported on 03/28/2022) 03/28/2022: Ran out a couple of days ago  ? Tiotropium Bromide-Olodaterol (STIOLTO RESPIMAT) 2.5-2.5 MCG/ACT  AERS Inhale 2 puffs into the lungs daily. (Patient not taking: Reported on 03/28/2022) 03/28/2022: Gets from Patient Assistance, ran out the end of March  ? [DISCONTINUED] Vitamin D, Ergocalciferol, (DRISDOL) 1.25 MG (50000 UNIT) CAPS capsule Take 1 capsule (50,000 Units total) by mouth every 7 (seven) days.   ? ?No facility-administered encounter medications on file as of 03/28/2022.  ? ?No Known Allergies ? ?ROS: The patient denies anorexia, fever, headaches, vision changes, decreased hearing, ear pain, sore throat, breast concerns, chest pain, dizziness, syncope, swelling, nausea, vomiting, diarrhea, constipation, abdominal pain, melena, hematochezia, hematuria, incontinence, dysuria, vaginal bleeding, discharge, odor or itch, genital lesions, weakness, tremor, suspicious skin lesions,  depression, anxiety, abnormal bleeding/bruising, or enlarged lymph nodes.  ?ome intermittent itchy ears (ongoing/chronic, sometimes wakes her up at night) ?SOB only with stairs and carrying groceries uphill, unchanged.

## 2022-03-27 NOTE — Patient Instructions (Addendum)
?HEALTH MAINTENANCE RECOMMENDATIONS: ? ?It is recommended that you get at least 30 minutes of aerobic exercise at least 5 days/week (for weight loss, you may need as much as 60-90 minutes). This can be any activity that gets your heart rate up. This can be divided in 10-15 minute intervals if needed, but try and build up your endurance at least once a week.  Weight bearing exercise is also recommended twice weekly. ? ?Eat a healthy diet with lots of vegetables, fruits and fiber.  "Colorful" foods have a lot of vitamins (ie green vegetables, tomatoes, red peppers, etc).  Limit sweet tea, regular sodas and alcoholic beverages, all of which has a lot of calories and sugar.  Up to 1 alcoholic drink daily may be beneficial for women (unless trying to lose weight, watch sugars).  Drink a lot of water. ? ?Calcium recommendations are 1200-1500 mg daily (1500 mg for postmenopausal women or women without ovaries), and vitamin D 1000 IU daily.  This should be obtained from diet and/or supplements (vitamins), and calcium should not be taken all at once, but in divided doses. ? ?Monthly self breast exams and yearly mammograms for women over the age of 68 is recommended. ? ?Sunscreen of at least SPF 30 should be used on all sun-exposed parts of the skin when outside between the hours of 10 am and 4 pm (not just when at beach or pool, but even with exercise, golf, tennis, and yard work!)  Use a sunscreen that says "broad spectrum" so it covers both UVA and UVB rays, and make sure to reapply every 1-2 hours. ? ?Remember to change the batteries in your smoke detectors when changing your clock times in the spring and fall. Carbon monoxide detectors are recommended for your home. ? ?Use your seat belt every time you are in a car, and please drive safely and not be distracted with cell phones and texting while driving. ? ?Please get tetanus booster from the pharmacy (TdaP). This is covered by Medicare Part D, and should no longer have  any out of pocket cost. ? ?I recommend getting the new shingles vaccine (Shingrix). Since you have Medicare, you will need to get this from the pharmacy, as it is covered by Part D. This also no longer has any out of pocket cost for you (as of 11/2021). ?This is a series of 2 injections, spaced 2 months apart.   ?This should be separated from other vaccines by at least 2 weeks. ? ?Bivalent COVID booster is recommended. You can choose either Moderna or [place] (you don't have to stick with the same one if you don't want to). ? ?Please contact your lung doctor's office regarding getting more Stiolto through patient assistance (CALL them, don't wait for your next appointment). ?Please try and get regular exercise as we discussed.  If this is hard for you to do, ask Dr. Loanne Drilling if you are a candidate for pulmonary rehab. ? ?Please look at the handout on calcium content in foods . You should be getting a total of '1500mg'$  from what you get in your diet, and what you take in from vitamins.  If you can't get enough from your diet, you may need to start taking a calcium pill. ?It is also important that you get enough vitamin D daily, to help protect your bones.  We will be checking this level again with your next labs. ?In the meantime, please start taking 2000 IU of vitamin D3 every day. ? ?Cut back  on potatoes and chips in your diet (cutting back on carbs to help with sugars).  You should also limit sugary candies (try and get sugar-free candies for after meals). ? ?

## 2022-03-28 ENCOUNTER — Encounter: Payer: Self-pay | Admitting: Family Medicine

## 2022-03-28 ENCOUNTER — Ambulatory Visit (INDEPENDENT_AMBULATORY_CARE_PROVIDER_SITE_OTHER): Payer: Medicare Other | Admitting: Family Medicine

## 2022-03-28 VITALS — BP 134/72 | HR 60 | Ht 66.0 in | Wt 160.8 lb

## 2022-03-28 DIAGNOSIS — Z5181 Encounter for therapeutic drug level monitoring: Secondary | ICD-10-CM

## 2022-03-28 DIAGNOSIS — M8580 Other specified disorders of bone density and structure, unspecified site: Secondary | ICD-10-CM

## 2022-03-28 DIAGNOSIS — I700 Atherosclerosis of aorta: Secondary | ICD-10-CM

## 2022-03-28 DIAGNOSIS — I2510 Atherosclerotic heart disease of native coronary artery without angina pectoris: Secondary | ICD-10-CM

## 2022-03-28 DIAGNOSIS — R7303 Prediabetes: Secondary | ICD-10-CM | POA: Diagnosis not present

## 2022-03-28 DIAGNOSIS — D692 Other nonthrombocytopenic purpura: Secondary | ICD-10-CM

## 2022-03-28 DIAGNOSIS — I10 Essential (primary) hypertension: Secondary | ICD-10-CM

## 2022-03-28 DIAGNOSIS — Z0000 Encounter for general adult medical examination without abnormal findings: Secondary | ICD-10-CM

## 2022-03-28 DIAGNOSIS — J449 Chronic obstructive pulmonary disease, unspecified: Secondary | ICD-10-CM | POA: Diagnosis not present

## 2022-03-28 DIAGNOSIS — E559 Vitamin D deficiency, unspecified: Secondary | ICD-10-CM | POA: Diagnosis not present

## 2022-03-28 DIAGNOSIS — Z7185 Encounter for immunization safety counseling: Secondary | ICD-10-CM

## 2022-03-28 MED ORDER — LOSARTAN POTASSIUM 50 MG PO TABS: 50.0000 mg | ORAL_TABLET | Freq: Every day | ORAL | 3 refills | Status: DC

## 2022-04-10 ENCOUNTER — Encounter: Payer: Self-pay | Admitting: Pulmonary Disease

## 2022-04-10 ENCOUNTER — Ambulatory Visit: Payer: Medicare Other | Admitting: Pulmonary Disease

## 2022-04-10 VITALS — BP 112/74 | HR 66 | Ht 66.0 in | Wt 159.4 lb

## 2022-04-10 DIAGNOSIS — J449 Chronic obstructive pulmonary disease, unspecified: Secondary | ICD-10-CM | POA: Diagnosis not present

## 2022-04-10 MED ORDER — PREDNISONE 10 MG PO TABS: 30.0000 mg | ORAL_TABLET | Freq: Every day | ORAL | 0 refills | Status: AC

## 2022-04-10 MED ORDER — ALBUTEROL SULFATE HFA 108 (90 BASE) MCG/ACT IN AERS: 2.0000 | INHALATION_SPRAY | Freq: Four times a day (QID) | RESPIRATORY_TRACT | 2 refills | Status: DC | PRN

## 2022-04-10 NOTE — Progress Notes (Signed)
? ? ?Subjective:  ? ?[name] GENDER: female DOB: Jan 12, 1946, MRN: [id number] ? ? ?HPI ? ?Chief Complaint  ?Patient presents with  ? Follow-up  ?  copd  ? ?Tiffany [name] is 76 year old female former smoker with severe COPD who presents for follow-up. ? ?Synopsis: ?Well-controlled COPD on Stioloto however symptoms worsen over fall/winter. Trialed ICS addition however no improvement so discontinued. Last COPD exacerbation requiring hospitalization 08/2020. She is not intentionally active but will climb stairs for laundry. ? ?01/08/22 ?Since our last visit, we added ICS but no improvement perceived by patient. She continues to have shortness of breath which is her primary issue. Denies wheezing or cough. No recent fever or chills. She has been using her husband's oxygen for shortness of breath that will abruptly occur. Last night after cooking she had midsternal chest dullness/tightness/pressure associated with shortness of breath for a few hours. She did not check her oxygen level but used oxygen with improvement. Denies any radiation of pain to jaw or arm. She had an episode similar to this a few weeks ago after cleaning her house. ? ?04/10/22 ?Since our last visit, she has good days and bad days with her breathing. She is compliant with Stiolto once a and Flovent twice a day. Still needing albuterol 2-3 times a day. Her activity is not limited and able around the house and perform ADLs. She reports she wears her husbands oxygen at times and improves her dyspnea. Reports some wheezing after cleaning her car and garage yesterday. Symptoms worsened overnight.  ? ?Social History: ?Former smoker.  Quit 08/2018.  50 pack years ?Son lives in [place] ? ?Past Medical History:  ?Diagnosis Date  ? Abnormal PFT 09/02/2014  ? moderate restrictive ventilatory defect.  ? BCC (basal cell carcinoma), face 2010  ? Dr. Allyson Sabal  ? Colon polyp 12/2003  ? Diverticulosis 11/2010  ? seen on colonoscopy and air  contrast BE  ? Emphysema of lung ([place])   ? Hyperlipidemia   ? Hypertension   ? previously treated with meds, resolved  ? MGUS (monoclonal gammopathy of unknown significance) 1999  ? prevously under care of Dr. Learta Codding  ? Osteopenia   ? DEX[facility] 01/31/2010 [facility]; 2014 at [facility]  ? 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 Tobacco abuse   ?  ?No Known Allergies  ? ?Outpatient Medications Prior to Visit  ?Medication Sig Dispense Refill  ? albuterol (VENTOLIN HFA) 108 (90 Base) MCG/ACT inhaler Inhale 2 puffs into the lungs every 6 (six) hours as needed for wheezing or shortness of breath. 8 g 2  ? Coenzyme Q10 (COQ10) 100 MG CAPS Take 1 capsule by mouth daily.    ? losartan (COZAAR) 50 MG tablet Take 1 tablet (50 mg total) by mouth daily. 90 tablet 3  ? rosuvastatin (CRESTOR) 10 MG tablet Take 1 tablet (10 mg total) by mouth daily. 90 tablet 3  ? Tiotropium Bromide-Olodaterol (STIOLTO RESPIMAT) 2.5-2.5 MCG/ACT AERS Inhale 2 puffs into the lungs daily. 4 g 11  ? fluticasone (FLOVENT HFA) 110 MCG/ACT inhaler Inhale 2 puffs into the lungs in the morning and at bedtime. (Patient not taking: Reported on 04/10/2022) 1 each 6  ? ?No facility-administered medications prior to visit.  ? ? ?Review of Systems  ?Constitutional:  Negative for chills, diaphoresis, fever, malaise/fatigue and weight loss.  ?HENT:  Negative for congestion.   ?  Respiratory:  Positive for shortness of breath. Negative for cough, hemoptysis, sputum production and wheezing.   ?Cardiovascular:  Negative for chest pain, palpitations and leg swelling.  ? ?Objective:  ? ?Vitals:  ? 04/10/22 1129  ?BP: 112/74  ?Pulse: 66  ?SpO2: 95%  ?Weight: 159 lb 6.4 oz (72.3 kg)  ?Height: '5\' 6"'$  (1.676 m)  ? ?SpO2: 95 % ?O2 Device: None (Room air) ? ?Physical Exam: ?General: Well-appearing, no acute distress ?HENT: [place], AT ?Eyes: EOMI, no scleral icterus ?Respiratory: Diminished with minimal wheezing  bilaterally. No rhonchi ?Cardiovascular: RRR, -M/R/G, no JVD ?Extremities:-Edema,-tenderness ?Neuro: AAO x4, CNII-XII grossly intact ?Psych: Normal mood, normal affect ? ?Chest imaging: ?CXR 09/10/2018- hyperinflated lung fields.  No acute infiltrate/effusion or edema ?CT Chest Lung 01/12/19 - Emphysema, unchanged pulmonary nodules ?CT Chest Lung Screen 01/26/20 - Unchanged pulmonary nodules, larges 3.17m in RML. Background emphysema ?CT Chest Lung Screen 02/14/21 - Centrilobular and paraseptal emphysema. Biapical pleuroparenchymal scarring. Tiny pulmonary nodules, unchanged. ?CXR 01/08/21 - Increased coarse interstitial markings ? ?PFT: ?12/02/2017-severe obstructive defect. ?Ratio 58 FEV1 37 FVC 48 ? ?EKG ?01/08/22 - No acute ST changes or TWI. Prior anterior infarct. ?   ?Assessment & Plan:  ?76year old female with severe COPD who presents for follow-up.  In mild exacerbation today after exerting herself yesterday.  Continues to have persistent symptoms that have been ongoing despite triple inhaler therapy.  In office O2 95%.  Prior ambulatory O2 with no desaturations however reports benefit of oxygen therapy (belongs to her husband) at night.  Will treat for COPD exacerbation counseled on benefit of pulmonary rehab ? ?COPD exacerbation, mild ?Severe COPD with emphysema, GOLD Class B - persistent symptoms ?--CONTINUE Flovent 110 TWO puffs TWICE a day ?--CONTINUE Stiolto Respimat TWO puffs ONCE a day ?--CONTINUE Albuterol as needed for shortness of breath or wheezing. REFILL ?--ORDER overnight oximetry on room air ?--REFER to Pulmonary rehab ?--Prednisone 30 mg daily x 5 days ? ?Health Maintenance ?Immunization History  ?Administered Date(s) Administered  ? Fluad Quad(high Dose 65+) 09/15/2019, 08/08/2020, 09/21/2021  ? Influenza Split 10/16/2012  ? Influenza, High Dose Seasonal PF 09/02/2013, 08/25/2014, 08/31/2015, 11/01/2016, 09/09/2017, 09/09/2018  ? PFIZER(Purple Top)SARS-COV-2 Vaccination 07/01/2020, 07/22/2020  ?  Pneumococcal Conjugate-13 08/25/2014  ? Pneumococcal Polysaccharide-23 12/12/2009, 08/31/2015  ? Tdap 12/12/2009  ? ?CT Lung Screen - Due 01/2022 ? ?Orders Placed This Encounter  ?Procedures  ? AMB referral to pulmonary rehabilitation  ?  Referral Priority:   Routine  ?  Referral Type:   Consultation  ?  Number of Visits Requested:   1  ? Pulse oximetry, overnight  ?  Standing Status:   Future  ?  Standing Expiration Date:   04/11/2023  ?  Scheduling Instructions:  ?   overnight oximetry on room air  ? ? ?Meds ordered this encounter  ?Medications  ? albuterol (VENTOLIN HFA) 108 (90 Base) MCG/ACT inhaler  ?  Sig: Inhale 2 puffs into the lungs every 6 (six) hours as needed for wheezing or shortness of breath.  ?  Dispense:  8 g  ?  Refill:  2  ? predniSONE (DELTASONE) 10 MG tablet  ?  Sig: Take 3 tablets (30 mg total) by mouth daily with breakfast for 5 days.  ?  Dispense:  15 tablet  ?  Refill:  0  ? ? ?Return in about 4 months (around 08/11/2022).  ? ?I have spent a total time of 30-minutes on the day of the appointment including chart review, data review, collecting history, coordinating  care and discussing medical diagnosis and plan with the patient/family. Past medical history, allergies, medications were reviewed. Pertinent imaging, labs and tests included in this note have been reviewed and interpreted independently by me. ? ? ? Rodman Pickle, MD ?[facility] ?04/10/2022 11:32 AM  ? ? ? ?

## 2022-04-10 NOTE — Patient Instructions (Addendum)
COPD exacerbation, mild ?Severe COPD with emphysema, GOLD Class B - persistent symptoms ?--CONTINUE Flovent 110 TWO puffs TWICE a day ?--CONTINUE Stiolto Respimat TWO puffs ONCE a day ?--CONTINUE Albuterol as needed for shortness of breath or wheezing. REFILL ?--ORDER overnight oximetry on room air ?--Prednisone 30 mg daily x 5 days ? ?Follow-up with me with me in 4 months ?

## 2022-04-12 ENCOUNTER — Encounter (HOSPITAL_COMMUNITY): Payer: Self-pay

## 2022-04-12 ENCOUNTER — Telehealth (HOSPITAL_COMMUNITY): Payer: Self-pay

## 2022-04-12 ENCOUNTER — Telehealth (HOSPITAL_COMMUNITY): Payer: Self-pay | Admitting: *Deleted

## 2022-04-12 ENCOUNTER — Other Ambulatory Visit: Payer: Self-pay | Admitting: Family Medicine

## 2022-04-12 DIAGNOSIS — M8580 Other specified disorders of bone density and structure, unspecified site: Secondary | ICD-10-CM

## 2022-04-12 NOTE — Telephone Encounter (Signed)
Attempted to call patient in regards to Pulmonary Rehab - LM on VM Mailed letter 

## 2022-04-12 NOTE — Telephone Encounter (Signed)
Pt returned call from message left by pulmonary rehab staff. Reviewed pulmonary rehab process. Pt is interested in participating however she is concerned about the cost. Would like to know the insurance benefits and patient responsibility. Tiffany [name] is aware of the wait list. Maurice Small RN, BSN Cardiac and Pulmonary Rehab Nurse Navigator

## 2022-04-13 ENCOUNTER — Telehealth: Payer: Self-pay | Admitting: Pulmonary Disease

## 2022-04-13 ENCOUNTER — Telehealth (HOSPITAL_COMMUNITY): Payer: Self-pay

## 2022-04-13 NOTE — Telephone Encounter (Signed)
Pt insurance is active and benefits verified through [facility]. Co-pay $20.00, DED $0.00/$0.00 met, out of pocket $3,950.00/$173.63 met, co-insurance 0%. No pre-authorization required. Lea A./BCBS Medicare, 04/13/22 @ 10:15AM, DJT#[id number]

## 2022-04-13 NOTE — Telephone Encounter (Signed)
Returned pt PR phone call and stated she is not interested in PR.   Closed referral

## 2022-04-13 NOTE — Telephone Encounter (Signed)
Patient dropped off [facility] along with proof of income. Placed in Dr. Cordelia Pen box.

## 2022-04-16 ENCOUNTER — Other Ambulatory Visit: Payer: Medicare Other

## 2022-04-16 ENCOUNTER — Telehealth: Payer: Self-pay | Admitting: *Deleted

## 2022-04-16 DIAGNOSIS — I10 Essential (primary) hypertension: Secondary | ICD-10-CM

## 2022-04-16 DIAGNOSIS — R7303 Prediabetes: Secondary | ICD-10-CM | POA: Diagnosis not present

## 2022-04-16 DIAGNOSIS — E559 Vitamin D deficiency, unspecified: Secondary | ICD-10-CM | POA: Diagnosis not present

## 2022-04-16 DIAGNOSIS — I2510 Atherosclerotic heart disease of native coronary artery without angina pectoris: Secondary | ICD-10-CM

## 2022-04-16 DIAGNOSIS — Z5181 Encounter for therapeutic drug level monitoring: Secondary | ICD-10-CM | POA: Diagnosis not present

## 2022-04-16 DIAGNOSIS — D692 Other nonthrombocytopenic purpura: Secondary | ICD-10-CM

## 2022-04-16 NOTE — Telephone Encounter (Signed)
Called patient and informed her that we do have the paperwork. Nothing further needed

## 2022-04-16 NOTE — Telephone Encounter (Signed)
LMTC to schedule Yearly Lung CA CT Scan. 

## 2022-04-17 ENCOUNTER — Other Ambulatory Visit: Payer: Self-pay | Admitting: *Deleted

## 2022-04-17 DIAGNOSIS — Z87891 Personal history of nicotine dependence: Secondary | ICD-10-CM

## 2022-04-17 DIAGNOSIS — Z122 Encounter for screening for malignant neoplasm of respiratory organs: Secondary | ICD-10-CM

## 2022-04-17 NOTE — Patient Instructions (Incomplete)
  Tiffany [name] , Thank you for taking time to come for your Medicare Wellness Visit. I appreciate your ongoing commitment to your health goals. Please review the following plan we discussed and let me know if I can assist you in the future.   This is a list of the screening recommended for you and due dates:  Health Maintenance  Topic Date Due   Zoster (Shingles) Vaccine (1 of 2) Never done   Tetanus Vaccine  12/13/2019   Colon Cancer Screening  11/28/2020   COVID-19 Vaccine (3 - Pfizer risk series) 09/21/2022*   Flu Shot  06/26/2022   Mammogram  05/19/2023   Pneumonia Vaccine  Completed   DEXA scan (bone density measurement)  Completed   Hepatitis C Screening: USPSTF Recommendation to screen - Ages 61-79 yo.  Completed   HPV Vaccine  Aged Out  *Topic was postponed. The date shown is not the original due date.   Please get tetanus booster from the pharmacy (TdaP). This is covered by Medicare Part D, and should no longer have any out of pocket cost.   I recommend getting the new shingles vaccine (Shingrix). Since you have Medicare, you will need to get this from the pharmacy, as it is covered by Part D. This also no longer has any out of pocket cost for you (as of 11/2021). This is a series of 2 injections, spaced 2 months apart.   This should be separated from other vaccines by at least 2 weeks.   Bivalent COVID booster is recommended. You can choose either Moderna or [place] (you don't have to stick with the same one if you don't want to). It is recommended that you get this now (since you haven't had the bivalent booster yet).   I'd like for you to get the updated bivalent COVID booster when it becomes available in the Fall, along with your flu shot.  Mammograms are recommended yearly (the date above it 2 years from the last one).  Guidelines recommend 1-2 years (I recommend yearly, which would be due in 04/2022, no 2024 as reported above).  Please bring [place] copies of your Living Will and  [place] once completed and notarized so that it can be scanned into your medical chart.  I think you would benefit from using a weekly pill box. Take the prescription vitamin D once weekly (usually a green capsule). After you finish the full course (12 weeks), then start taking an over-the-counter vitamin D3. I would buy 2000 IU of the D3 now, have it at home, and start taking it after you have taken the last green capsule.  (Fill the pill box with the D3, taken daily).

## 2022-04-17 NOTE — Progress Notes (Unsigned)
No chief complaint on file.  Tiffany [name] is a 76 y.o. female who presents for Medicare wellness visit and follow-up on recent labs.    See below for labs done prior to today's visit.  Hyperlipidemia: She previously took simvastatin, as she was noted to have aortic atherosclerosis on CT for lung cancer screening (and calcified atherosclerotic plaque in RCA). Dr. Audie Box changed her simvastatin to rosuvastatin '10mg'$  in 01/2022.  She is tolerating this without side effects. Repeat lipids were done, see below.  Vitamin D deficiency:  Last level was 23.3 in 08/2021, had been 24.6 in 02/2021. She hadn't taken any supplements since completing rx course (she was supposed to start OTC D3 (5000 IU) upon finishing the prescription). She admitted at her visit earlier this month that she hadn't been taking any OTC vitamin D.  UPDATE--started any???  Immunization History  Administered Date(s) Administered   Fluad Quad(high Dose 65+) 09/15/2019, 08/08/2020, 09/21/2021   Influenza Split 10/16/2012   Influenza, High Dose Seasonal PF 09/02/2013, 08/25/2014, 08/31/2015, 11/01/2016, 09/09/2017, 09/09/2018   PFIZER(Purple Top)SARS-COV-2 Vaccination 07/01/2020, 07/22/2020   Pneumococcal Conjugate-13 08/25/2014   Pneumococcal Polysaccharide-23 12/12/2009, 08/31/2015   Tdap 12/12/2009   Last Pap smear: s/p hyst in 20's   Last mammogram: 04/2021 Last colonoscopy: 11/2010 Dr. Wynetta Emery. She got a letter a couple of years ago stating she was due, but didn't go. She declines repeating colonoscopy (unless she has symptoms). Last DEXA: 03/2020 T-2.1 L femoral neck; scheduled for later this month Dentist: doesn't go, has dentures   Ophtho: yearly Exercise: "not much"  Patient Care Team: Rita Ohara, MD as PCP - General (Family Medicine)  Pulmonary: Dr. Loanne Drilling Dermatologist:  Dr. Ledell Peoples PA GI:  Dr. Wynetta Emery [facility])--retired Ophtho:  ophtho (specialist): Dr. Edilia Bo   Depression Screening: Flowsheet Row  Office Visit from 03/28/2022 in [facility]  PHQ-2 Total Score 0         Falls screen:     03/28/2022    1:34 PM 03/22/2021    9:44 AM 03/08/2021    9:44 AM 09/20/2020   12:08 PM 08/18/2020   11:05 AM  [place] in the past year? 0 0 0 0 0  Number falls in past yr: 0 0 0 0 1  Injury with Fall? 0 0 0 0 0  Risk for fall due to : No Fall Risks No Fall Risks No Fall Risks No Fall Risks History of fall(s)  Follow up Falls evaluation completed Falls evaluation completed Falls evaluation completed  Falls evaluation completed     Functional Status Survey:         End of Life Discussion:  Patient does not have a living will and medical power of attorney. She was given paperwork in the past, hasn't returned it. She still has it at home.   PMH, PSH, SH and FH were reviewed and updated    ROS: The patient denies anorexia, fever, headaches, vision changes, decreased hearing, ear pain, sore throat, breast concerns, chest pain, dizziness, syncope, swelling, nausea, vomiting, diarrhea, constipation, abdominal pain, melena, hematochezia, hematuria, incontinence, dysuria, vaginal bleeding, discharge, odor or itch, genital lesions, weakness, tremor, suspicious skin lesions, depression, anxiety, abnormal bleeding/bruising, or enlarged lymph nodes.  ome intermittent itchy ears (ongoing/chronic, sometimes wakes her up at night) SOB only with stairs and carrying groceries uphill, unchanged. Chronic cough -- thick, clear phlegm (less frequent since quitting smoking).  Has heartburn with certain foods, such as pizza. Tums prn helps. Occasional L knee  pain, mainly with stairs    PHYSICAL EXAM:  There were no vitals taken for this visit.  Wt Readings from Last 3 Encounters:  04/10/22 159 lb 6.4 oz (72.3 kg)  03/28/22 160 lb 12.8 oz (72.9 kg)  01/18/22 162 lb (73.5 kg)   Pleasant, well-appearing female, in no distress. She is alert and oriented. Normal gait. Normal mood,  affect, hygiene, grooming, eye contact and speech.  Vitamin D-OH 24.8  Lab Results  Component Value Date   WBC 7.5 04/16/2022   HGB 14.5 04/16/2022   HCT 42.2 04/16/2022   MCV 92 04/16/2022   PLT 208 04/16/2022   Fasting glucose  ENTER CHEM AND LIPIDS--PENDING STILL   ASSESSMENT/PLAN:   Does she still have living will forms from prior years? Given again last year.  Remind to return  D deficiency Rx vs 5000/d Send lipids to [facility]  Discussed monthly self breast exams and yearly mammogram; at least 30 minutes of aerobic activity at least 5 days/week, weight-bearing exercise 2x/wk; proper sunscreen use reviewed; healthy diet, including goals of calcium and vitamin D intake and alcohol recommendations (less than or equal to 1 drink/day) reviewed; regular seatbelt use; changing batteries in smoke detectors. Immunization recommendations discussed-- continue yearly flu shots.  Tdap and Shingrix again recommended, to get from pharmacy. COVID booster recommended, declined. Colonoscopy recommendations reviewed, declines any screening at this time, aware of potential risks of later dx of cancer.   MOST form reviewed and signed. Full Code, Full care. Doesn't want prolonged measures.  Discussed importance/reasons for completing Living Will and healthcare POA, encouraged to fill out and give [place] a copy after notarized. She would want full initial resuscitation, including intubation.     Medicare Attestation I have personally reviewed: The patient's medical and social history Their use of alcohol, tobacco or illicit drugs Their current medications and supplements The patient's functional ability including ADLs,fall risks, home safety risks, cognitive, and hearing and visual impairment Diet and physical activities Evidence for depression or mood disorders  The patient's weight, height, BMI have been recorded in the chart.  I have made referrals, counseling, and provided education to the  patient based on review of the above and I have provided the patient with a written personalized care plan for preventive services.

## 2022-04-18 ENCOUNTER — Ambulatory Visit (INDEPENDENT_AMBULATORY_CARE_PROVIDER_SITE_OTHER): Payer: Medicare Other | Admitting: Family Medicine

## 2022-04-18 ENCOUNTER — Telehealth: Payer: Self-pay | Admitting: Pulmonary Disease

## 2022-04-18 ENCOUNTER — Encounter: Payer: Self-pay | Admitting: Family Medicine

## 2022-04-18 VITALS — BP 122/68 | HR 88 | Ht 66.0 in | Wt 159.0 lb

## 2022-04-18 DIAGNOSIS — E7800 Pure hypercholesterolemia, unspecified: Secondary | ICD-10-CM

## 2022-04-18 DIAGNOSIS — E559 Vitamin D deficiency, unspecified: Secondary | ICD-10-CM | POA: Diagnosis not present

## 2022-04-18 DIAGNOSIS — Z0000 Encounter for general adult medical examination without abnormal findings: Secondary | ICD-10-CM

## 2022-04-18 DIAGNOSIS — Z5181 Encounter for therapeutic drug level monitoring: Secondary | ICD-10-CM | POA: Diagnosis not present

## 2022-04-18 LAB — COMPREHENSIVE METABOLIC PANEL
Chloride: 100 mmol/L (ref 96–106)
Potassium: 4.1 mmol/L (ref 3.5–5.2)
Sodium: 139 mmol/L (ref 134–144)

## 2022-04-18 LAB — CBC WITH DIFFERENTIAL/PLATELET
Basophils Absolute: 0 10*3/uL (ref 0.0–0.2)
Basos: 0 %
EOS (ABSOLUTE): 0 10*3/uL (ref 0.0–0.4)
Eos: 0 %
Hematocrit: 42.2 % (ref 34.0–46.6)
Hemoglobin: 14.5 g/dL (ref 11.1–15.9)
Immature Grans (Abs): 0 10*3/uL (ref 0.0–0.1)
Immature Granulocytes: 0 %
Lymphocytes Absolute: 1.9 10*3/uL (ref 0.7–3.1)
Lymphs: 26 %
MCH: 31.5 pg (ref 26.6–33.0)
MCHC: 34.4 g/dL (ref 31.5–35.7)
MCV: 92 fL (ref 79–97)
Monocytes Absolute: 0.7 10*3/uL (ref 0.1–0.9)
Monocytes: 9 %
Neutrophils Absolute: 4.8 10*3/uL (ref 1.4–7.0)
Neutrophils: 65 %
Platelets: 208 10*3/uL (ref 150–450)
RBC: 4.61 x10E6/uL (ref 3.77–5.28)
RDW: 12 % (ref 11.7–15.4)
WBC: 7.5 10*3/uL (ref 3.4–10.8)

## 2022-04-18 LAB — LIPID PANEL

## 2022-04-18 LAB — VITAMIN D 25 HYDROXY (VIT D DEFICIENCY, FRACTURES): Vit D, 25-Hydroxy: 24.8 ng/mL — ABNORMAL LOW (ref 30.0–100.0)

## 2022-04-18 MED ORDER — VITAMIN D (ERGOCALCIFEROL) 1.25 MG (50000 UNIT) PO CAPS: 50000.0000 [IU] | ORAL_CAPSULE | ORAL | 0 refills | Status: DC

## 2022-04-18 NOTE — Telephone Encounter (Signed)
ATC patient. Line rang for approx. 2 minutes with no answer and no vm option

## 2022-04-19 DIAGNOSIS — R0683 Snoring: Secondary | ICD-10-CM | POA: Diagnosis not present

## 2022-04-19 DIAGNOSIS — G4730 Sleep apnea, unspecified: Secondary | ICD-10-CM | POA: Diagnosis not present

## 2022-04-20 NOTE — Telephone Encounter (Signed)
Called and spoke with patient, she stated she did not have any questions regarding her OV, she was wondering about her prescription for her Albuterol.  She states she usually get a message from [facility] when it is ready and she did not receive a message.  She says that come to find out, [facility] had the prescription, they had just not filled it.  She has gotten her medication now.  She was asking about the ONO she did on Wednesday night.  I let her know that we get the results from a third party and we will call her when we have the results and it may be a week or so.  She verbalized understanding.  Nothing further needed.

## 2022-04-24 ENCOUNTER — Other Ambulatory Visit: Payer: Medicare Other

## 2022-04-24 DIAGNOSIS — Z5181 Encounter for therapeutic drug level monitoring: Secondary | ICD-10-CM

## 2022-04-24 DIAGNOSIS — E7800 Pure hypercholesterolemia, unspecified: Secondary | ICD-10-CM

## 2022-04-25 LAB — COMPREHENSIVE METABOLIC PANEL
ALT: 13 IU/L (ref 0–32)
AST: 18 IU/L (ref 0–40)
Albumin/Globulin Ratio: 1.7 (ref 1.2–2.2)
Albumin: 4.4 g/dL (ref 3.7–4.7)
Alkaline Phosphatase: 103 IU/L (ref 44–121)
BUN/Creatinine Ratio: 12 (ref 12–28)
BUN: 9 mg/dL (ref 8–27)
Bilirubin Total: 0.6 mg/dL (ref 0.0–1.2)
CO2: 21 mmol/L (ref 20–29)
Calcium: 9.1 mg/dL (ref 8.7–10.3)
Chloride: 99 mmol/L (ref 96–106)
Creatinine, Ser: 0.78 mg/dL (ref 0.57–1.00)
Globulin, Total: 2.6 g/dL (ref 1.5–4.5)
Glucose: 116 mg/dL — ABNORMAL HIGH (ref 70–99)
Potassium: 4.3 mmol/L (ref 3.5–5.2)
Sodium: 139 mmol/L (ref 134–144)
Total Protein: 7 g/dL (ref 6.0–8.5)
eGFR: 80 mL/min/{1.73_m2} (ref >59)

## 2022-04-25 LAB — LIPID PANEL
Chol/HDL Ratio: 2.2 ratio (ref 0.0–4.4)
Cholesterol, Total: 125 mg/dL (ref 100–199)
HDL: 57 mg/dL (ref >39)
LDL Chol Calc (NIH): 53 mg/dL (ref 0–99)
Triglycerides: 78 mg/dL (ref 0–149)
VLDL Cholesterol Cal: 15 mg/dL (ref 5–40)

## 2022-04-26 ENCOUNTER — Telehealth: Payer: Self-pay

## 2022-04-26 ENCOUNTER — Ambulatory Visit (INDEPENDENT_AMBULATORY_CARE_PROVIDER_SITE_OTHER)
Admission: RE | Admit: 2022-04-26 | Discharge: 2022-04-26 | Disposition: A | Discharge status: Home / Self Care | Payer: Medicare Other | Source: Ambulatory Visit | Attending: Acute Care | Admitting: Acute Care

## 2022-04-26 ENCOUNTER — Telehealth: Payer: Self-pay | Admitting: Pulmonary Disease

## 2022-04-26 DIAGNOSIS — Z87891 Personal history of nicotine dependence: Secondary | ICD-10-CM | POA: Diagnosis not present

## 2022-04-26 DIAGNOSIS — Z122 Encounter for screening for malignant neoplasm of respiratory organs: Secondary | ICD-10-CM

## 2022-04-26 DIAGNOSIS — R7981 Abnormal blood-gas level: Secondary | ICD-10-CM

## 2022-04-26 NOTE — Telephone Encounter (Signed)
Overnight Oximetry  04/19/22 On RA SpO2 <88% 6 hr 37 min 24 sec Nadir SpO2 78%  Nocturnal hypoxemia --START 2L O2 via [place] nightly

## 2022-04-26 NOTE — Telephone Encounter (Signed)
Called patient to inform her that I was placing a new order for oxygen at to use 2L at night time only. Patient verbalized understanding. Nothing further needed

## 2022-04-26 NOTE — Telephone Encounter (Signed)
-----   Message from Geralynn Rile, MD sent at 04/26/2022  6:51 AM EDT ----- Cholesterol at goal. Continue current medications.   [place] T. Audie Box, MD, [place]  [address] [place], [place] [address] [phone]  6:52 AM  ----- Message ----- From: Clinton Sawyer, RMA Sent: 04/25/2022  12:25 PM EDT To: Geralynn Rile, MD  Labs for your review on mutual patient Tiffany Mcdiarmid" [name] dob 1946-10-07.

## 2022-04-26 NOTE — Telephone Encounter (Signed)
Attempted to contact patient, LVM to call back to discuss.  Left call back number.   

## 2022-04-30 ENCOUNTER — Other Ambulatory Visit: Payer: Self-pay | Admitting: Acute Care

## 2022-04-30 DIAGNOSIS — Z87891 Personal history of nicotine dependence: Secondary | ICD-10-CM

## 2022-04-30 DIAGNOSIS — Z122 Encounter for screening for malignant neoplasm of respiratory organs: Secondary | ICD-10-CM

## 2022-05-02 DIAGNOSIS — J441 Chronic obstructive pulmonary disease with (acute) exacerbation: Secondary | ICD-10-CM | POA: Diagnosis not present

## 2022-05-14 ENCOUNTER — Other Ambulatory Visit: Payer: Self-pay | Admitting: *Deleted

## 2022-05-14 NOTE — Patient Outreach (Signed)
[facility] [facility]) Care Management  05/14/2022  Tiffany [name] 07-02-1946 [id number]   RN Health coach has closed Case. Patient has met her goals. She does not want to attend respiratory rehab as per  Dr ordered  Plan: Letter sent to PCP Letter sent to Patient RN closed case Certificate of completion sent to patient  [place] Management [phone]

## 2022-05-16 DIAGNOSIS — Z789 Other specified health status: Secondary | ICD-10-CM | POA: Diagnosis not present

## 2022-05-16 DIAGNOSIS — L905 Scar conditions and fibrosis of skin: Secondary | ICD-10-CM | POA: Diagnosis not present

## 2022-05-16 DIAGNOSIS — C44629 Squamous cell carcinoma of skin of left upper limb, including shoulder: Secondary | ICD-10-CM | POA: Diagnosis not present

## 2022-05-16 DIAGNOSIS — L538 Other specified erythematous conditions: Secondary | ICD-10-CM | POA: Diagnosis not present

## 2022-05-16 DIAGNOSIS — L820 Inflamed seborrheic keratosis: Secondary | ICD-10-CM | POA: Diagnosis not present

## 2022-06-18 ENCOUNTER — Telehealth: Payer: Self-pay | Admitting: Pulmonary Disease

## 2022-06-18 MED ORDER — ALBUTEROL SULFATE HFA 108 (90 BASE) MCG/ACT IN AERS
2.0000 | INHALATION_SPRAY | Freq: Four times a day (QID) | RESPIRATORY_TRACT | 3 refills | Status: DC | PRN
Start: 2022-06-18 — End: 2022-08-15

## 2022-06-18 NOTE — Telephone Encounter (Signed)
Called and spoke with patient. Advised patient that as long  as her numbers don't match the numbers that were on the letter she is okay to continue using her albuterol. Patent verbalized understanding. Patient also stated that she was on her last albuterol. Advised patient I would send in refills for her. Patient verified pharmacy.   Nothing further needed.

## 2022-06-26 DIAGNOSIS — L989 Disorder of the skin and subcutaneous tissue, unspecified: Secondary | ICD-10-CM | POA: Diagnosis not present

## 2022-06-26 DIAGNOSIS — D225 Melanocytic nevi of trunk: Secondary | ICD-10-CM | POA: Diagnosis not present

## 2022-06-26 DIAGNOSIS — L821 Other seborrheic keratosis: Secondary | ICD-10-CM | POA: Diagnosis not present

## 2022-06-26 DIAGNOSIS — L570 Actinic keratosis: Secondary | ICD-10-CM | POA: Diagnosis not present

## 2022-06-26 DIAGNOSIS — C4441 Basal cell carcinoma of skin of scalp and neck: Secondary | ICD-10-CM | POA: Diagnosis not present

## 2022-06-26 DIAGNOSIS — L814 Other melanin hyperpigmentation: Secondary | ICD-10-CM | POA: Diagnosis not present

## 2022-08-15 ENCOUNTER — Ambulatory Visit: Payer: Medicare Other | Admitting: Pulmonary Disease

## 2022-08-15 ENCOUNTER — Encounter: Payer: Self-pay | Admitting: Pulmonary Disease

## 2022-08-15 VITALS — BP 118/62 | HR 65 | Ht 66.0 in | Wt 157.6 lb

## 2022-08-15 DIAGNOSIS — J449 Chronic obstructive pulmonary disease, unspecified: Secondary | ICD-10-CM | POA: Diagnosis not present

## 2022-08-15 DIAGNOSIS — Z23 Encounter for immunization: Secondary | ICD-10-CM | POA: Diagnosis not present

## 2022-08-15 DIAGNOSIS — G4734 Idiopathic sleep related nonobstructive alveolar hypoventilation: Secondary | ICD-10-CM | POA: Diagnosis not present

## 2022-08-15 MED ORDER — ALBUTEROL SULFATE HFA 108 (90 BASE) MCG/ACT IN AERS: 2.0000 | INHALATION_SPRAY | Freq: Four times a day (QID) | RESPIRATORY_TRACT | 6 refills | Status: DC | PRN

## 2022-08-15 MED ORDER — FLUTICASONE PROPIONATE HFA 110 MCG/ACT IN AERO
2.0000 | INHALATION_SPRAY | Freq: Two times a day (BID) | RESPIRATORY_TRACT | 6 refills | Status: DC
Start: 2022-08-15 — End: 2022-11-13

## 2022-08-15 NOTE — Progress Notes (Signed)
Subjective:   [name] GENDER: female DOB: 1946-02-01, MRN: [id number]   HPI  Chief Complaint  Patient presents with   Follow-up    No updates feeling fine   Tiffany [name] is 76 year old female former smoker with severe COPD who presents for follow-up.  Synopsis: Well-controlled COPD on Stioloto however symptoms worsen over fall/winter. Trialed ICS addition however no improvement so discontinued. Last COPD exacerbation requiring hospitalization 08/2020. She is not intentionally active but will climb stairs for laundry.  01/08/22 Since our last visit, we added ICS but no improvement perceived by patient. She continues to have shortness of breath which is her primary issue. Denies wheezing or cough. No recent fever or chills. She has been using her husband's oxygen for shortness of breath that will abruptly occur. Last night after cooking she had midsternal chest dullness/tightness/pressure associated with shortness of breath for a few hours. She did not check her oxygen level but used oxygen with improvement. Denies any radiation of pain to jaw or arm. She had an episode similar to this a few weeks ago after cleaning her house.  04/10/22 Since our last visit, she has good days and bad days with her breathing. She is compliant with Stiolto once a and Flovent twice a day. Still needing albuterol 2-3 times a day. Her activity is not limited and able around the house and perform ADLs. She reports she wears her husbands oxygen at times and improves her dyspnea. Reports some wheezing after cleaning her car and garage yesterday. Symptoms worsened overnight.   08/15/22 Has not been on Stiolto for several months and reports that she did not hear back from patient assistance. Has previously been approved in the past. On Flovent twice a day. Uses albuterol 2-3 times a day. Reports shortness of breath and chest tightness in the evening. Compliant with nightly O2 and feels  this help. Activity not limited.   Social History: Former smoker.  Quit 08/2018.  50 pack years Son lives in [place]  Past Medical History:  Diagnosis Date   Abnormal PFT 09/02/2014   moderate restrictive ventilatory defect.   BCC (basal cell carcinoma), face 2010   Dr. Allyson Sabal   Colon polyp 12/2003   Diverticulosis 11/2010   seen on colonoscopy and air contrast BE   Emphysema of lung ([place])    Hyperlipidemia    Hypertension    previously treated with meds, resolved   MGUS (monoclonal gammopathy of unknown significance) 1999   prevously under care of Dr. 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 8 g 3   Coenzyme Q10 (COQ10) 100 MG CAPS Take 1 capsule by mouth daily.     fluticasone (FLOVENT HFA) 110 MCG/ACT inhaler Inhale 2 puffs into the lungs in the morning and at bedtime. 1 each 6   losartan (COZAAR) 50 MG tablet Take 1 tablet (50 mg total) by mouth daily. 90 tablet 3   rosuvastatin (CRESTOR) 10 MG tablet Take 1 tablet (10 mg total)  by mouth daily. 90 tablet 3   Vitamin D, Ergocalciferol, (DRISDOL) 1.25 MG (50000 UNIT) CAPS capsule Take 1 capsule (50,000 Units total) by mouth every 7 (seven) days. 12 capsule 0   Tiotropium Bromide-Olodaterol (STIOLTO RESPIMAT) 2.5-2.5 MCG/ACT AERS Inhale 2 puffs into the lungs daily. (Patient not taking: Reported on 08/15/2022) 4 g 11   No facility-administered medications prior to visit.    Review of Systems  Constitutional:  Negative for chills, diaphoresis, fever, malaise/fatigue and weight loss.  HENT:  Negative for congestion.    Respiratory:  Positive for shortness of breath. Negative for cough, hemoptysis, sputum production and wheezing.   Cardiovascular:  Negative for chest pain, palpitations and leg swelling.    Objective:   Vitals:   08/15/22 1152  BP: 118/62  Pulse: 65  SpO2: 93%  Weight: 157 lb 9.6 oz (71.5 kg)  Height: '5\' 6"'$  (1.676 m)   SpO2: 93 % O2 Device: None (Room air)  Physical Exam: General: Well-appearing, no acute distress HENT: [place], AT Eyes: EOMI, no scleral icterus Respiratory: Clear to auscultation bilaterally.  No crackles, wheezing or rales Cardiovascular: RRR, -M/R/G, no JVD Extremities:-Edema,-tenderness Neuro: AAO x4, CNII-XII grossly intact Psych: Normal mood, normal affect   Chest imaging: CXR 09/10/2018- hyperinflated lung fields.  No acute infiltrate/effusion or edema CT Chest Lung 01/12/19 - Emphysema, unchanged pulmonary nodules CT Chest Lung Screen 01/26/20 - Unchanged pulmonary nodules, larges 3.51m in RML. Background emphysema CT Chest Lung Screen 02/14/21 - Centrilobular and paraseptal emphysema. Biapical pleuroparenchymal scarring. Tiny pulmonary nodules, unchanged. CXR 01/08/21 - Increased coarse interstitial markings CT Chest Lung Screen 04/26/22 - Moderate centrilobular and paraseptal emphysema. Pulmonary nodules unchanged with largest 4.7 mm  PFT: 12/02/2017-severe obstructive defect. Ratio 58 FEV1 37 FVC 48  EKG 01/08/22 - No acute ST changes or TWI. Prior anterior infarct.    Assessment & Plan:  76year old female former smoker with severe COPD, nocturnal hypoxemia, MGUS, HTN who presents for follow-up. On ICS/LABA/LAMA with persistent symptoms. Not in exacerbation.  Severe COPD with emphysema, GOLD Class B - persistent symptoms on triple therapy --CONTINUE Flovent 110 TWO puffs TWICE a day --Provide Stioloto samples if available. Provide patient assistance forms. Will also contact pharmacy team regarding status. --CONTINUE Albuterol as needed for shortness of  breath or wheezing. REFILL --Consider Pulm Rehab in the future --Plan for ambulatory O2 at next visit +/- echocardiogram  Nocturnal hypoxemia --CONTINUE nightly oxygen at 2L  Health Maintenance Immunization History  Administered Date(s) Administered   Fluad Quad(high Dose 65+) 09/15/2019, 08/08/2020, 09/21/2021, 08/15/2022   Influenza Split 10/16/2012   Influenza, High Dose Seasonal PF 09/02/2013, 08/25/2014, 08/31/2015, 11/01/2016, 09/09/2017, 09/09/2018   PFIZER(Purple Top)SARS-COV-2 Vaccination 07/01/2020, 07/22/2020   Pneumococcal Conjugate-13 08/25/2014   Pneumococcal Polysaccharide-23 12/12/2009, 08/31/2015   Tdap 12/12/2009   CT Lung Screen - Enrolled  Orders Placed This Encounter  Procedures   Flu Vaccine QUAD High Dose(Fluad)    Meds ordered this encounter  Medications   albuterol (VENTOLIN HFA) 108 (90 Base) MCG/ACT inhaler    Sig: Inhale 2 puffs into the lungs every 6 (six) hours as needed for wheezing or shortness of breath.    Dispense:  8 g    Refill:  6   fluticasone (FLOVENT HFA) 110 MCG/ACT inhaler    Sig: Inhale 2 puffs into the lungs in the morning and at bedtime.    Dispense:  1 each    Refill:  6    Return in about 3 months (around 11/14/2022).  I have spent a total time of 32-minutes on the day of the appointment including chart review, data review, collecting history, coordinating care and discussing medical diagnosis and plan with the patient/family. Past medical history, allergies, medications were reviewed. Pertinent imaging, labs and tests included in this note have been reviewed and interpreted independently by me.   Rodman Pickle, MD [facility] 08/15/2022 12:12 PM

## 2022-08-15 NOTE — Patient Instructions (Signed)
Severe COPD with emphysema, GOLD Class B - persistent symptoms --CONTINUE Flovent 110 TWO puffs TWICE a day --Provide Stioloto samples if available. Provide patient assistance forms --CONTINUE Albuterol as needed for shortness of breath or wheezing. REFILL  Nocturnal hypoxemia --CONTINUE nightly oxygen at 2L  Follow-up with me in 3 months

## 2022-08-18 ENCOUNTER — Telehealth: Payer: Self-pay | Admitting: Pulmonary Disease

## 2022-08-18 NOTE — Telephone Encounter (Signed)
Could you please look into status of Stiolto assistance approval for patient? She has previously been approved in the past but has not been contacted yet despite our office completing the forms with patient. I have asked patient to resubmit and try again but we also should check in on progress of initial forms.  Thank you, Rodman Pickle, M.D. [facility] 08/18/2022 9:53 AM

## 2022-08-20 ENCOUNTER — Other Ambulatory Visit (HOSPITAL_COMMUNITY): Payer: Self-pay

## 2022-08-20 MED ORDER — STIOLTO RESPIMAT 2.5-2.5 MCG/ACT IN AERS: 2.0000 | INHALATION_SPRAY | Freq: Every day | RESPIRATORY_TRACT | 11 refills | Status: DC

## 2022-08-20 NOTE — Telephone Encounter (Signed)
It looks like the approval is good under pt insurance it covered it however it looks like her script has expired   Test billing for this patient's plan returns the  following results LABA/LAMA   for products:  stiolto  $37.00  Selinda Orion CPhT Rx Patient Advocate

## 2022-08-20 NOTE — Telephone Encounter (Signed)
Usually we send the script to the patient assistance program. I will refill but asking about patient assistance status.

## 2022-08-20 NOTE — Telephone Encounter (Signed)
PA Team does not currently process Patient Assistance at this; time we only do test claims and PA.  I ran a test claim and it looks like she does not need PA for Stiolto.

## 2022-08-20 NOTE — Telephone Encounter (Signed)
[place] you know how we can check the status of patient assistance for Stioloto for patients?

## 2022-08-23 NOTE — Telephone Encounter (Signed)
Patient seen in office 08/15/22 given the phone number to [place] to check status of [place]

## 2022-09-12 DIAGNOSIS — C4441 Basal cell carcinoma of skin of scalp and neck: Secondary | ICD-10-CM | POA: Diagnosis not present

## 2022-09-26 ENCOUNTER — Ambulatory Visit
Admission: RE | Admit: 2022-09-26 | Discharge: 2022-09-26 | Disposition: A | Discharge status: Home / Self Care | Payer: Medicare Other | Source: Ambulatory Visit | Attending: Family Medicine | Admitting: Family Medicine

## 2022-09-26 DIAGNOSIS — M8580 Other specified disorders of bone density and structure, unspecified site: Secondary | ICD-10-CM

## 2022-09-26 DIAGNOSIS — Z780 Asymptomatic menopausal state: Secondary | ICD-10-CM | POA: Diagnosis not present

## 2022-09-26 DIAGNOSIS — M8589 Other specified disorders of bone density and structure, multiple sites: Secondary | ICD-10-CM | POA: Diagnosis not present

## 2022-10-21 NOTE — Progress Notes (Signed)
Chief Complaint  Patient presents with   Hypertension    Fasting med check. (Had coffee with cream no sugar) IS taking her crestor and has plenty. Has not gotten Tdap, Shingrix or RSV from pharmacy from pharmacy. Does not want Covid booster today. Gets Stiolto from pulm. Has not done living will as of yet. And did start taking 1,000 vit D after finishing rx. Has been getting frequent heartburn, takes OTC Tums and that works but has been doing more than usual.    Patient presents for 6 month follow-up on chronic problems.  She is complaining of heartburn, feels food come up into her throat, and burning in her throat. She is having to take Tums 2-3 per day. She had pizza last night, which made it worse. Denies dysphagia.  Hypertension. She is compliant with taking losartan 74m. She denies side effects, headaches, palpitations, edema, shortness of breath (other than from COPD).   BP's haven't been checked at home recently.  BP Readings from Last 3 Encounters:  10/22/22 134/80  08/15/22 118/62  04/18/22 122/68   She had [place] cardiology [place] She had coronary CTA (calcium score of 46, 51st percentile, nonobstructive CAD), echo (EF 60-65%, mild MR, mild late systolic prolapse of anterior leaflet of MV), and BNP (normal). DOE felt to be related to her lungs.   Hyperlipidemia: She previously took simvastatin, as she was noted to have aortic atherosclerosis on CT for lung cancer screening (and calcified atherosclerotic plaque in RCA). Dr. OAudie Boxchanged her simvastatin to rosuvastatin 165min 01/2022, and lipids were at goal on this regimen in 03/2022.  She is tolerating this without side effects, takes CoQ10 daily.  She has occasional foot cramps, unchanged.  Lab Results  Component Value Date   CHOL 125 04/24/2022   HDL 57 04/24/2022   LDLCALC 53 04/24/2022   TRIG 78 04/24/2022   CHOLHDL 2.2 04/24/2022   Osteopenia--She previously took bisphosphonates (changed from fosamax (due  to nausea), then both Actonel and Boniva, which she tolerated but stopped due to cost issues, and possibly had some heartburn.  Calcium intake--she doesn't take any supplements, other than the Tums she has been taking recently for heartburn.   She has whole milk with cereal (1-2x/week), eats macaroni and cheese once a week. She quit smoking 08/2018. Vitamin D deficiency has been an ongoing issue (see below). DEXA was rechecked earlier this month, and was stable, with T-1.9 at L fem neck (prior was 03/2020, T-2.1 at left fem neck).   Vitamin D deficiency:  Last level was 24.8 in 03/2022 (had been 23.3 in 08/2021, 24.6 in 02/2021). At that time, she hadn't taken any supplements since completing rx course (she was supposed to start OTC D3  upon finishing the prescription). She was treated with another 12 week course of Rx D, and was supposed to start OTC D3 upon completion, which she has done.  She is taking D3 1000 IU daily.  Pre-diabetes:  Last A1c was 5.9% in October, fasting glu was 102.  She has dum-dum lollipops after meals.  She tries to limit her sweets. Not a lot of bread (unless he makes biscuits), +potatoes, spaghetti and mac and cheese occasionally. Eats low sodium potato chips (doesn't buy these all the time).   COPD: She is under the care of pulmonary, last seen in September. At that time, she had been out of her Stiolto for a few months. She is now able to get this through her insurance, and  notes a significant improvement in her breathing.  She continues to use Flovent twice a day, using albuterol 2-3 times a day. Compliant with nightly O2, which helps with shortness of breath. Shortness of breath is stable (with stairs, driveway carrying groceries).  She gets yearly CT screening for lung cancer, last in June.   PMH, PSH, SH reviewed  Outpatient Encounter Medications as of 10/22/2022  Medication Sig Note   albuterol (VENTOLIN HFA) 108 (90 Base) MCG/ACT inhaler Inhale 2 puffs into the  lungs every 6 (six) hours as needed for wheezing or shortness of breath. 10/22/2022: Uses daily, 2-3x/day   cholecalciferol (VITAMIN D3) 25 MCG (1000 UNIT) tablet Take 1,000 Units by mouth daily.    Coenzyme Q10 (COQ10) 100 MG CAPS Take 1 capsule by mouth daily.    fluticasone (FLOVENT HFA) 110 MCG/ACT inhaler Inhale 2 puffs into the lungs in the morning and at bedtime. 10/22/2022: Uses BID   losartan (COZAAR) 50 MG tablet Take 1 tablet (50 mg total) by mouth daily.    rosuvastatin (CRESTOR) 10 MG tablet Take 1 tablet (10 mg total) by mouth daily.    Tiotropium Bromide-Olodaterol (STIOLTO RESPIMAT) 2.5-2.5 MCG/ACT AERS Inhale 2 puffs into the lungs daily. 10/22/2022: Uses after dinner   [DISCONTINUED] Vitamin D, Ergocalciferol, (DRISDOL) 1.25 MG (50000 UNIT) CAPS capsule Take 1 capsule (50,000 Units total) by mouth every 7 (seven) days.    No facility-administered encounter medications on file as of 10/22/2022.   No Known Allergies  ROS: no fever, chills, headaches, dizziness, chest pain, palpitations, URI symptoms.  Cough/breathing at baseline.  No edema.  No bleeding, rash.  +bruising easily (chronic, unchanged). No nausea, vomiting, diarrhea other other GI complaints. No GU complaints. Moods are good.    PHYSICAL EXAM:  BP 134/80   Pulse 60   Ht _0  (1.676 m)   Wt 161 lb 12.8 oz (73.4 kg)   BMI 26.12 kg/m   132/64 on repeat by MD  Wt Readings from Last 3 Encounters:  10/22/22 161 lb 12.8 oz (73.4 kg)  08/15/22 157 lb 9.6 oz (71.5 kg)  04/18/22 159 lb (72.1 kg)   Pleasant, well-appearing female in no distress HEENT: conjunctiva and sclera are clear. EOMI.   Neck: no lymphadenopathy, thyromegaly or mass, no bruit. Heart: regular rate and rhythm, no murmur Lungs: somewhat distant, especially in upper lung fields. No wheezes, rales, ronchi Back: no spinal or CVA tenderness Abdomen: soft, nontender, no organomegaly or mass Extremities: no edema, normal pulses Skin: normal  turgor, no rash. no bruising or purpura on her arms, +purpura on her lower legs. Psych: normal mood, affect, hygiene and grooming Neuro: alert and oriented, normal gait   Lab Results  Component Value Date   HGBA1C 6.1 (A) 10/22/2022     ASSESSMENT/PLAN:  Essential hypertension - Plan: Comprehensive metabolic panel  Prediabetes - reviewed proper diet, exercise - Plan: HgB A1c  Vitamin D deficiency - Plan: VITAMIN D 25 Hydroxy (Vit-D Deficiency, Fractures)  Pure hypercholesterolemia - at goal on last check, cont crestor  Aortic atherosclerosis (HCC) - cont statin  Nonobstructive atherosclerosis of coronary artery - cont statin and good BP control  Chronic obstructive pulmonary disease, unspecified COPD type ([place]) - stable on current regimen per pulm  Osteopenia, unspecified location - discussed calcium goals, continue daily vitamin D.  Weight-bearing exercise recommended at least 2x/week  Nocturnal hypoxemia - compliant with oxygen at night  Senile purpura (HCC) - on lower legs (arms are clear today)  Gastroesophageal reflux  disease without esophagitis - discussed precautions, diet, OTC meds in detail. Escalate to Rx if not adequately treated with changesi n diet, and OTC H2 blocker or PPI  Vaccine counseling - encouraged RSV from pharmacy, followed by TdaP and Shingrix. Declined COVID booster  Medication monitoring encounter - Plan: Comprehensive metabolic panel, VITAMIN D 25 Hydroxy (Vit-D Deficiency, Fractures)  C-met (nonfasting--had creamer in coffee), D  Reviewed reminders from her physical--never got Tdap or Shingrix from pharmacy, hasn't completed living will and healthcare POA.  Encouraged to do so. Declined COVID booster.  Fasting CPE after 03/30/23, with nonfasting AWV after 5/25

## 2022-10-21 NOTE — Patient Instructions (Addendum)
I recommend getting the RSV vaccine from the pharmacy.  COVID booster is also recommended.  Tetanus shot (TdaP) and shingrix are also recommended, as we discussed at your wellness visits--you need to get these from the pharmacy. Vaccines should be given at the same time or separated by 2 weeks.   Get the RSV first, since that is a seasonal illness, occurring in the winter.  Please be sure to get adequate calcium from your diet, or take a calcium supplement to make up for what you don't get from your diet (goal of 1200-1500 mg daily). Your Tums has calcium--this would count as a calcium supplement. If your heartburn gets better with dietary changes (or other over-the-counter medications), then you may still want to take 1-2/day for calcium, depending on other sources in your diet.  It is important that you continue to take vitamin D supplement daily, long-term.  This helps you absorb calcium and helps prevent further bone loss.  Your pre-diabetes is slightly worse than last check (A1c of 6.1%, an average look at the last 3 months).  Try and limit your bread, pasta and potatoes.  Try and eat whole grains (pasta/bread).  Try and eat a high fiber diet. Switch to sugar-free candies or lollipops for after meals. Try and limit potato chips and pretzels (both for the salt and the carbs). Consider eating more fruits and vegetables as snacks (applie slices with peanut butter, carrot sticks with hummus).  Or you can try Hippeas which are made from chick peas and are a little healthier than plain chips.  I'd rather you eat an apple than drink a lot of juice. The fruit has more fiber and much less sugar.  Cut back on caffeine, cut back on citrus and acidic foods (tomatoes). Limit spicy foods. Limit alcohol, chocolate, peppermint. Be sure to wait at least 2-3 hours after you eat before you lay down, or try elevating the head of the bed. Try and eat small amounts at a time (over-eating will make reflux  worse).  If you continue to have issues with heartburn despite making changes to your diet you may want to try taking Pepcid once or twice daily.  Please get the new RSV vaccine from the pharmacy. It is also still important that you get the tetanus (TdaP) and shingles vaccines, but the RSV is a winter illness, so get that one first. Separate by vaccines by at least 2 weeks (get RSV first, followed by tetanus--since you are 2 years late in getting this booster--and then start the Shingrix series (2 vaccines, 2 months apart).  Please also work on completing your living will and healthcare power of attorney. Once notarized, get [place] a copy (or bring it to your next visit).

## 2022-10-22 ENCOUNTER — Ambulatory Visit (INDEPENDENT_AMBULATORY_CARE_PROVIDER_SITE_OTHER): Payer: Medicare Other | Admitting: Family Medicine

## 2022-10-22 ENCOUNTER — Encounter: Payer: Self-pay | Admitting: Family Medicine

## 2022-10-22 VITALS — BP 132/64 | HR 60 | Ht 66.0 in | Wt 161.8 lb

## 2022-10-22 DIAGNOSIS — R7303 Prediabetes: Secondary | ICD-10-CM | POA: Diagnosis not present

## 2022-10-22 DIAGNOSIS — I10 Essential (primary) hypertension: Secondary | ICD-10-CM | POA: Diagnosis not present

## 2022-10-22 DIAGNOSIS — I700 Atherosclerosis of aorta: Secondary | ICD-10-CM

## 2022-10-22 DIAGNOSIS — Z7185 Encounter for immunization safety counseling: Secondary | ICD-10-CM

## 2022-10-22 DIAGNOSIS — Z5181 Encounter for therapeutic drug level monitoring: Secondary | ICD-10-CM | POA: Diagnosis not present

## 2022-10-22 DIAGNOSIS — J449 Chronic obstructive pulmonary disease, unspecified: Secondary | ICD-10-CM

## 2022-10-22 DIAGNOSIS — D692 Other nonthrombocytopenic purpura: Secondary | ICD-10-CM

## 2022-10-22 DIAGNOSIS — E559 Vitamin D deficiency, unspecified: Secondary | ICD-10-CM | POA: Diagnosis not present

## 2022-10-22 DIAGNOSIS — I2510 Atherosclerotic heart disease of native coronary artery without angina pectoris: Secondary | ICD-10-CM

## 2022-10-22 DIAGNOSIS — E7800 Pure hypercholesterolemia, unspecified: Secondary | ICD-10-CM

## 2022-10-22 DIAGNOSIS — G4734 Idiopathic sleep related nonobstructive alveolar hypoventilation: Secondary | ICD-10-CM

## 2022-10-22 DIAGNOSIS — M8580 Other specified disorders of bone density and structure, unspecified site: Secondary | ICD-10-CM

## 2022-10-22 DIAGNOSIS — K219 Gastro-esophageal reflux disease without esophagitis: Secondary | ICD-10-CM

## 2022-10-22 LAB — POCT GLYCOSYLATED HEMOGLOBIN (HGB A1C): Hemoglobin A1C: 6.1 % — AB (ref 4.0–5.6)

## 2022-10-23 ENCOUNTER — Encounter: Payer: Self-pay | Admitting: Family Medicine

## 2022-10-23 LAB — COMPREHENSIVE METABOLIC PANEL
ALT: 12 IU/L (ref 0–32)
AST: 19 IU/L (ref 0–40)
Albumin/Globulin Ratio: 1.7 (ref 1.2–2.2)
Albumin: 4.5 g/dL (ref 3.8–4.8)
Alkaline Phosphatase: 110 IU/L (ref 44–121)
BUN/Creatinine Ratio: 17 (ref 12–28)
BUN: 15 mg/dL (ref 8–27)
Bilirubin Total: 0.4 mg/dL (ref 0.0–1.2)
CO2: 24 mmol/L (ref 20–29)
Calcium: 9.6 mg/dL (ref 8.7–10.3)
Chloride: 101 mmol/L (ref 96–106)
Creatinine, Ser: 0.87 mg/dL (ref 0.57–1.00)
Globulin, Total: 2.6 g/dL (ref 1.5–4.5)
Glucose: 98 mg/dL (ref 70–99)
Potassium: 4.3 mmol/L (ref 3.5–5.2)
Sodium: 141 mmol/L (ref 134–144)
Total Protein: 7.1 g/dL (ref 6.0–8.5)
eGFR: 69 mL/min/{1.73_m2} (ref >59)

## 2022-10-23 LAB — VITAMIN D 25 HYDROXY (VIT D DEFICIENCY, FRACTURES): Vit D, 25-Hydroxy: 39.9 ng/mL (ref 30.0–100.0)

## 2022-11-13 ENCOUNTER — Encounter (HOSPITAL_BASED_OUTPATIENT_CLINIC_OR_DEPARTMENT_OTHER): Payer: Self-pay | Admitting: Pulmonary Disease

## 2022-11-13 ENCOUNTER — Ambulatory Visit (INDEPENDENT_AMBULATORY_CARE_PROVIDER_SITE_OTHER): Payer: Medicare Other | Admitting: Pulmonary Disease

## 2022-11-13 VITALS — BP 110/60 | HR 69 | Ht 66.0 in | Wt 159.2 lb

## 2022-11-13 DIAGNOSIS — J449 Chronic obstructive pulmonary disease, unspecified: Secondary | ICD-10-CM | POA: Diagnosis not present

## 2022-11-13 MED ORDER — ASMANEX (120 METERED DOSES) 220 MCG/ACT IN AEPB: 2.0000 | INHALATION_SPRAY | Freq: Every day | RESPIRATORY_TRACT | 5 refills | Status: DC

## 2022-11-13 NOTE — Patient Instructions (Signed)
Severe COPD with emphysema, GOLD Class B - persistent symptoms on triple therapy --STOP Flovent --START mometasone TWO puffs ONCE a day --CONTINUE Stiolto TWO puffs ONCE a day --CONTINUE Albuterol as needed for shortness of breath or wheezing  Follow-up with me in 3 months (March)

## 2022-11-13 NOTE — Progress Notes (Unsigned)
Subjective:   [name] GENDER: female DOB: 07-17-1946, MRN: [id number]   HPI  Chief Complaint  Patient presents with   Follow-up    Flovent will not be covered in new year by insurance    Tiffany [name] is 76 year old female former smoker with severe COPD who presents for follow-up.  Synopsis: Well-controlled COPD on Stioloto however symptoms worsen over fall/winter. Trialed ICS addition however no improvement so discontinued. Last COPD exacerbation requiring hospitalization 08/2020. She is not intentionally active but will climb stairs for laundry.  01/08/22 Since our last visit, we added ICS but no improvement perceived by patient. She continues to have shortness of breath which is her primary issue. Denies wheezing or cough. No recent fever or chills. She has been using her husband's oxygen for shortness of breath that will abruptly occur. Last night after cooking she had midsternal chest dullness/tightness/pressure associated with shortness of breath for a few hours. She did not check her oxygen level but used oxygen with improvement. Denies any radiation of pain to jaw or arm. She had an episode similar to this a few weeks ago after cleaning her house.  04/10/22 Since our last visit, she has good days and bad days with her breathing. She is compliant with Stiolto once a and Flovent twice a day. Still needing albuterol 2-3 times a day. Her activity is not limited and able around the house and perform ADLs. She reports she wears her husbands oxygen at times and improves her dyspnea. Reports some wheezing after cleaning her car and garage yesterday. Symptoms worsened overnight.   08/15/22 Has not been on Stiolto for several months and reports that she did not hear back from patient assistance. Has previously been approved in the past. On Flovent twice a day. Uses albuterol 2-3 times a day. Reports shortness of breath and chest tightness in the evening. Compliant  with nightly O2 and feels this help. Activity not limited.   11/13/22 Since our last visit she has been on [employer] and Flovent. Improved but still has persistent shortness of breath and wheezing with activity. Denies exacerbations requiring steroids or antibiotics since our last visit. She received a letter that Flovent would no longer be provided next year.  Social History: Former smoker.  Quit 08/2018.  50 pack years Son lives in [place]  Past Medical History:  Diagnosis Date   Abnormal PFT 09/02/2014   moderate restrictive ventilatory defect.   BCC (basal cell carcinoma), face 2010   Dr. Allyson Sabal   Colon polyp 12/2003   Diverticulosis 11/2010   seen on colonoscopy and air contrast BE   Emphysema of lung ([place])    Hyperlipidemia    Hypertension    previously treated with meds, resolved   MGUS (monoclonal gammopathy of unknown significance) 1999   prevously under care of Dr. 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 8 g 6   cholecalciferol (VITAMIN D3) 25 MCG (1000 UNIT) tablet Take 1,000 Units by mouth daily.  Coenzyme Q10 (COQ10) 100 MG CAPS Take 1 capsule by mouth daily.     fluticasone (FLOVENT HFA) 110 MCG/ACT inhaler Inhale 2 puffs into the lungs in the morning and at bedtime. 1 each 6   losartan (COZAAR) 50 MG tablet Take 1 tablet (50 mg total) by mouth daily. 90 tablet 3   Tiotropium Bromide-Olodaterol (STIOLTO RESPIMAT) 2.5-2.5 MCG/ACT AERS Inhale 2 puffs into the lungs daily. 4 g 11   rosuvastatin (CRESTOR) 10 MG tablet Take 1 tablet (10 mg total) by mouth daily. 90  tablet 3   No facility-administered medications prior to visit.    Review of Systems  Constitutional:  Negative for chills, diaphoresis, fever, malaise/fatigue and weight loss.  HENT:  Negative for congestion.   Respiratory:  Positive for shortness of breath. Negative for cough, hemoptysis, sputum production and wheezing.   Cardiovascular:  Negative for chest pain, palpitations and leg swelling.    Objective:   Vitals:   11/13/22 1307  BP: 110/60  Pulse: 69  SpO2: 93%  Weight: 159 lb 2.8 oz (72.2 kg)  Height: '5\' 6"'$  (1.676 m)    SpO2: 93 % O2 Device: None (Room air)  Physical Exam: General: Well-appearing, no acute distress HENT: [place], AT Eyes: EOMI, no scleral icterus Respiratory: Clear to auscultation bilaterally.  No crackles, wheezing or rales Cardiovascular: RRR, -M/R/G, no JVD Extremities:-Edema,-tenderness Neuro: AAO x4, CNII-XII grossly intact Psych: Normal mood, normal affect   Chest imaging: CXR 09/10/2018- hyperinflated lung fields.  No acute infiltrate/effusion or edema CT Chest Lung 01/12/19 - Emphysema, unchanged pulmonary nodules CT Chest Lung Screen 01/26/20 - Unchanged pulmonary nodules, larges 3.93m in RML. Background emphysema CT Chest Lung Screen 02/14/21 - Centrilobular and paraseptal emphysema. Biapical pleuroparenchymal scarring. Tiny pulmonary nodules, unchanged. CXR 01/08/21 - Increased coarse interstitial markings CT Chest Lung Screen 04/26/22 - Moderate centrilobular and paraseptal emphysema. Pulmonary nodules unchanged with largest 4.7 mm  PFT: 12/02/2017-severe obstructive defect. Ratio 58 FEV1 37 FVC 48  EKG 01/08/22 - No acute ST changes or TWI. Prior anterior infarct.    Assessment & Plan:  76year old female former smoker with severe COPD, nocturnal hypoxemia, MGUS, HTN who presents for follow-up. On ICS/LABA/LAMA with persistent symptoms. Not in exacerbation   Severe COPD with emphysema, GOLD Class B - persistent symptoms on triple  therapy --STOP Flovent --START mometasone TWO puffs ONCE a day --CONTINUE Stiolto TWO puffs ONCE a day --CONTINUE Albuterol as needed for shortness of breath or wheezing --Declined Pulm Rehab  --Plan for ambulatory O2 at next visit +/- echocardiogram  Nocturnal hypoxemia --CONTINUE nightly oxygen at 2L  Health Maintenance Immunization History  Administered Date(s) Administered   Fluad Quad(high Dose 65+) 09/15/2019, 08/08/2020, 09/21/2021, 08/15/2022   Influenza Split 10/16/2012   Influenza, High Dose Seasonal PF 09/02/2013, 08/25/2014, 08/31/2015, 11/01/2016, 09/09/2017, 09/09/2018   PFIZER(Purple Top)SARS-COV-2 Vaccination 07/01/2020, 07/22/2020   Pneumococcal Conjugate-13 08/25/2014   Pneumococcal Polysaccharide-23 12/12/2009, 08/31/2015   Tdap 12/12/2009   CT Lung Screen - Enrolled  No orders of the defined types were placed in this encounter.   Meds ordered this encounter  Medications   mometasone (ASMANEX, 120 METERED DOSES,) 220 MCG/ACT inhaler    Sig: Inhale 2 puffs into the lungs daily.    Dispense:  1 each    Refill:  5    Return in about 3 months (around 02/12/2023).   I have spent a total time of 32-minutes on the day of the appointment including chart review, data review, collecting history, coordinating care  and discussing medical diagnosis and plan with the patient/family. Past medical history, allergies, medications were reviewed. Pertinent imaging, labs and tests included in this note have been reviewed and interpreted independently by me.   Rodman Pickle, MD [facility] 11/13/2022 1:20 PM

## 2022-11-14 ENCOUNTER — Encounter (HOSPITAL_BASED_OUTPATIENT_CLINIC_OR_DEPARTMENT_OTHER): Payer: Self-pay | Admitting: Pulmonary Disease

## 2022-11-21 ENCOUNTER — Other Ambulatory Visit: Payer: Self-pay | Admitting: *Deleted

## 2022-11-21 MED ORDER — ASMANEX (120 METERED DOSES) 220 MCG/ACT IN AEPB: 2.0000 | INHALATION_SPRAY | Freq: Every day | RESPIRATORY_TRACT | 5 refills | Status: DC

## 2023-02-12 ENCOUNTER — Ambulatory Visit (HOSPITAL_BASED_OUTPATIENT_CLINIC_OR_DEPARTMENT_OTHER): Payer: Medicare Other | Admitting: Pulmonary Disease

## 2023-02-12 ENCOUNTER — Encounter (HOSPITAL_BASED_OUTPATIENT_CLINIC_OR_DEPARTMENT_OTHER): Payer: Self-pay | Admitting: Pulmonary Disease

## 2023-02-12 VITALS — BP 140/60 | HR 72 | Wt 158.0 lb

## 2023-02-12 DIAGNOSIS — J449 Chronic obstructive pulmonary disease, unspecified: Secondary | ICD-10-CM

## 2023-02-12 DIAGNOSIS — G4734 Idiopathic sleep related nonobstructive alveolar hypoventilation: Secondary | ICD-10-CM | POA: Diagnosis not present

## 2023-02-12 NOTE — Patient Instructions (Addendum)
Severe COPD with emphysema, GOLD Class B - persistent symptoms on LAMA/LABA --Reviewed inhaler technique. --START mometasone TWO puffs ONCE a day. Rinse mouth out after use --CONTINUE Stiolto TWO puffs ONCE a day --CONTINUE Albuterol as needed for shortness of breath or wheezing --Declined Pulm Rehab  --Ambulatory O2 today with no significant desaturations  --Plan to repeat ambulatory O2 again at next visit

## 2023-02-12 NOTE — Progress Notes (Signed)
Subjective:   [name] GENDER: female DOB: 1946-10-22, MRN: [id number]   HPI  Chief Complaint  Patient presents with   Follow-up    No updates   Ms. Tiffany [name] is 77 year old female former smoker with severe COPD who presents for follow-up.  Synopsis: Well-controlled COPD on Stioloto however symptoms worsen over fall/winter. Trialed ICS addition however no improvement so discontinued. Last COPD exacerbation requiring hospitalization 08/2020. She is not intentionally active but will climb stairs for laundry.  01/08/22 Since our last visit, we added ICS but no improvement perceived by patient. She continues to have shortness of breath which is her primary issue. Denies wheezing or cough. No recent fever or chills. She has been using her husband's oxygen for shortness of breath that will abruptly occur. Last night after cooking she had midsternal chest dullness/tightness/pressure associated with shortness of breath for a few hours. She did not check her oxygen level but used oxygen with improvement. Denies any radiation of pain to jaw or arm. She had an episode similar to this a few weeks ago after cleaning her house.  04/10/22 Since our last visit, she has good days and bad days with her breathing. She is compliant with Stiolto once a and Flovent twice a day. Still needing albuterol 2-3 times a day. Her activity is not limited and able around the house and perform ADLs. She reports she wears her husbands oxygen at times and improves her dyspnea. Reports some wheezing after cleaning her car and garage yesterday. Symptoms worsened overnight.   08/15/22 Has not been on Stiolto for several months and reports that she did not hear back from patient assistance. Has previously been approved in the past. On Flovent twice a day. Uses albuterol 2-3 times a day. Reports shortness of breath and chest tightness in the evening. Compliant with nightly O2 and feels this help.  Activity not limited.   11/13/22 Since our last visit she has been on [employer] and Flovent. Improved but still has persistent shortness of breath and wheezing with activity. Denies exacerbations requiring steroids or antibiotics since our last visit. She received a letter that Flovent would no longer be provided next year.  02/12/23 Has been compliant with Stiolto. Has not started Asmanex as she was unable to remove the cap. Using albuterol 2-3 times a day for coughing and wheezing. Shortness of breath with exertion. Denies steroids or antibiotics since last visit. Compliant with oxygen at night. Will monitor her oxygen during the day with lowers 87%. Especially when she does laundry downstairs.  Social History: Former smoker.  Quit 08/2018.  50 pack years Son lives in [place]  Past Medical History:  Diagnosis Date   Abnormal PFT 09/02/2014   moderate restrictive ventilatory defect.   BCC (basal cell carcinoma), face 2010   Dr. Allyson Sabal   Colon polyp 12/2003   Diverticulosis 11/2010   seen on colonoscopy and air contrast BE   Emphysema of lung ([place])    Hyperlipidemia    Hypertension    previously treated with meds, resolved   MGUS (monoclonal gammopathy of unknown significance) 1999   prevously under care of Dr. 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 8 g 6   cholecalciferol (VITAMIN D3) 25 MCG (1000 UNIT) tablet Take 1,000 Units by mouth daily.     Coenzyme Q10 (COQ10) 100 MG CAPS Take 1 capsule by mouth daily.     losartan (COZAAR) 50 MG tablet Take 1 tablet (50 mg total) by  mouth daily. 90 tablet 3   mometasone (ASMANEX, 120 METERED DOSES,) 220 MCG/ACT inhaler Inhale 2 puffs into the lungs daily. 1 each 5   Tiotropium Bromide-Olodaterol (STIOLTO RESPIMAT) 2.5-2.5 MCG/ACT AERS Inhale 2 puffs into the lungs daily. 4 g 11   rosuvastatin (CRESTOR) 10 MG tablet Take 1 tablet (10 mg total) by mouth daily. 90 tablet 3   No facility-administered medications prior to visit.    Review of Systems  Constitutional:  Negative for chills, diaphoresis, fever, malaise/fatigue and weight loss.  HENT:  Negative for congestion.   Respiratory:  Positive for cough, shortness of breath and wheezing. Negative for hemoptysis and sputum production.   Cardiovascular:  Negative for chest pain, palpitations and leg swelling.    Objective:   Vitals:   02/12/23 1305  BP: (!) 140/60  Pulse: 72  SpO2: 94%  Weight: 158 lb (71.7 kg)    SpO2: 94 % O2 Device: None (Room air)  Physical Exam: General: Well-appearing, no acute distress HENT: [place], AT Eyes: EOMI, no scleral icterus Respiratory: Clear to auscultation bilaterally.  No crackles, wheezing or rales Cardiovascular: RRR, -M/R/G, no JVD Extremities:-Edema,-tenderness Neuro: AAO x4, CNII-XII grossly intact Psych: Normal mood, normal affect  Chest imaging: CXR 09/10/2018- hyperinflated lung fields.  No acute infiltrate/effusion or edema CT Chest Lung 01/12/19 - Emphysema, unchanged pulmonary nodules CT Chest Lung Screen 01/26/20 - Unchanged pulmonary nodules, larges 3.30mm in RML. Background emphysema CT Chest Lung Screen 02/14/21 - Centrilobular and paraseptal emphysema. Biapical pleuroparenchymal scarring. Tiny pulmonary nodules, unchanged. CXR 01/08/21 - Increased coarse interstitial markings CT Chest Lung Screen 04/26/22 - Moderate centrilobular and paraseptal emphysema. Pulmonary nodules unchanged with largest 4.7 mm  PFT: 12/02/2017-severe obstructive defect. Ratio 58 FEV1 37 FVC 48  EKG 01/08/22 - No acute ST changes or TWI.  Prior anterior infarct.    Assessment & Plan:  77 year old female former smoker with severe COPD, nocturnal hypoxemia, MGUS, HTN who presents for follow-up. Symptomatic on LAMA/LABA but not in exacerbation. Discussed clinical course and management of COPD including bronchodilator regimen, preventive care including vaccinations and action plan for exacerbation. Ambulatory O2 with transient desaturations but no significant requirement for oxygen.  Severe COPD with emphysema, GOLD Class B - persistent symptoms on LAMA/LABA --Reviewed inhaler technique. --START mometasone TWO puffs ONCE a day. Rinse mouth out after use --CONTINUE Stiolto TWO puffs ONCE a day --CONTINUE Albuterol as needed for shortness of breath or wheezing --Declined Pulm Rehab  --Plan for ambulatory O2 at next visit +/- echocardiogram  Nocturnal hypoxemia --CONTINUE nightly oxygen at 2L  Health Maintenance Immunization History  Administered Date(s) Administered   Fluad Quad(high Dose 65+) 09/15/2019, 08/08/2020, 09/21/2021, 08/15/2022   Influenza Split 10/16/2012   Influenza, High Dose Seasonal PF 09/02/2013, 08/25/2014, 08/31/2015, 11/01/2016, 09/09/2017, 09/09/2018   PFIZER(Purple Top)SARS-COV-2 Vaccination 07/01/2020, 07/22/2020   Pneumococcal Conjugate-13 08/25/2014   Pneumococcal Polysaccharide-23 12/12/2009, 08/31/2015   Tdap 12/12/2009   CT Lung Screen - Enrolled  No orders of the defined types were placed  in this encounter.  No orders of the defined types were placed in this encounter.  Return in about 6 months (around 08/15/2023) for routine follow-up, with Dr. Loanne Drilling.   I have spent a total time of 36-minutes on the day of the appointment including chart review, data review, collecting history, coordinating care and discussing medical diagnosis and plan with the patient/family. Past medical history, allergies, medications were reviewed. Pertinent imaging, labs and tests included in this note have been  reviewed and interpreted independently by me.   Rodman Pickle, MD [facility] 02/12/2023 3:00 PM

## 2023-04-24 NOTE — Progress Notes (Signed)
Chief Complaint  Patient presents with   Tiffany [name] while clipping rose bush in yard 2023-04-28 flipped over and hurt her left hip/back, right shoulder and toe on her left foot.   Sunday, April 28, 2023, while doing yard work (dead-heading the [employer]), she lost her balance (ground was sloped). She fell forward, tumbled and hit the house with her left side of her back. She also scraped her right shoulder  Denies head injury, syncope. She was able to get up on her own, get to the porch.  It knocked the breath out of her for a short bit. She had some R shoulder pain and pain in the left lower back.  Husband had tramadol (from kidney stones), took that which caused vomiting. She took Aleve, 1 pill twice daily for 2 days, didn't help. She used Extra Strength tylenol, which didn't help.  Icy hot and massage helped temporarily. Heat helps a little. Hasn't tried ice.  Had PT for her back in the past.  She resumed HEP, hasn't been helping.  Pain is currently at L SI area, buttock, goes down the back of the leg a little Hurts to stand and walk. No pain with sitting No numbness, tingling, weakness  Shoulder pain improved after a couple of days, no longer hurts.    PMH, PSH, SH reviewed  Outpatient Encounter Medications as of 04/25/2023  Medication Sig Note   cholecalciferol (VITAMIN D3) 25 MCG (1000 UNIT) tablet Take 1,000 Units by mouth daily.    Coenzyme Q10 (COQ10) 100 MG CAPS Take 1 capsule by mouth daily.    losartan (COZAAR) 50 MG tablet Take 1 tablet (50 mg total) by mouth daily.    rosuvastatin (CRESTOR) 10 MG tablet Take 1 tablet (10 mg total) by mouth daily.    Tiotropium Bromide-Olodaterol (STIOLTO RESPIMAT) 2.5-2.5 MCG/ACT AERS Inhale 2 puffs into the lungs daily. 04/25/2023: Did use this am   albuterol (VENTOLIN HFA) 108 (90 Base) MCG/ACT inhaler Inhale 2 puffs into the lungs every 6 (six) hours as needed for wheezing or shortness of breath. (Patient not taking: Reported on 04/25/2023)  04/25/2023: needed   mometasone (ASMANEX, 120 METERED DOSES,) 220 MCG/ACT inhaler Inhale 2 puffs into the lungs daily. (Patient not taking: Reported on 04/25/2023) 04/25/2023: Didn't find it helpful, so stopped using it   No facility-administered encounter medications on file as of 04/25/2023.   No Known Allergies   ROS: no fever, chills. Vomited this morning, after breakfast. Denies heartburn. Denies abdominal pain, changes to bowels. Worse nausea and vomiting when taking tramadol last week. No numbness, tingling, weakness.  LBP per HPI. No urinary complaints.   PHYSICAL EXAM:  BP 134/80   Pulse 60   Ht 5\' 6"  (1.676 m)   Wt 151 lb 3.2 oz (68.6 kg)   BMI 24.40 kg/m   Wt Readings from Last 3 Encounters:  04/25/23 151 lb 3.2 oz (68.6 kg)  02/12/23 158 lb (71.7 kg)  11/13/22 159 lb 2.8 oz (72.2 kg)   Pleasant, elderly female in no distress HEENT: conjunctiva and sclera are clear, EOMI Heart: regular rate and rhythm Lungs: clear Back: Entire spine nontender Mild tenderness at L SI joint, diffusely also at the surrounding muscles. No soft tissue swelling or bruising in this area. Discomfort with pyriformis and gluteal stretches FROM of shoulders Skin: Scabs/linear abrasions at the R upper back (under bra area).  Shoulder skin intact Skin is very dry Neuro: alert and oriented, cranial nerves grossly intact, walking with  cane. DTR's 2+ in UE's, 3+ at knees, absent at ankles, symmetric bilaterally Psych: normal mood, affect, hygiene and grooming  Urine: trace leuks, SG 1.020, otherwise normal  ASSESSMENT/PLAN:  Acute left-sided low back pain without sciatica - No radiculopathy.  Muscular component. no focal bony tenderness so will hold off on xray for now. Heat/stretches, NSAIDs. PT if not improving - Plan: POCT Urinalysis DIP (Proadvantage Device), meloxicam (MOBIC) 15 MG tablet  Take the meloxicam once daily with food. Do NOT take any advil/motrin/ibuprofen/aleve/naproxen,  Goody or BC powder while taking the prescription meloxicam. You MAY use extra strength tylenol, if needed. I encourage you to try SalonPas with Lidocaine as needed. You can also continue with icy hot and massage. Do the stretches for the hip/buttock as shown (crossing the left ankle to the right knee, and pressing on the left knee, and also crossing the knees, and leaning forwards). Continue the home exercises from prior physical therapy.  Call us on Monday if you'd prefer to restart physical therapy, if you're not improving.    F/u as scheduled in June for AWV and CPE

## 2023-04-25 ENCOUNTER — Encounter: Payer: Self-pay | Admitting: Family Medicine

## 2023-04-25 ENCOUNTER — Ambulatory Visit (INDEPENDENT_AMBULATORY_CARE_PROVIDER_SITE_OTHER): Payer: Medicare Other | Admitting: Family Medicine

## 2023-04-25 VITALS — BP 134/80 | HR 60 | Ht 66.0 in | Wt 151.2 lb

## 2023-04-25 DIAGNOSIS — M545 Low back pain, unspecified: Secondary | ICD-10-CM

## 2023-04-25 LAB — POCT URINALYSIS DIP (PROADVANTAGE DEVICE)
Bilirubin, UA: NEGATIVE
Blood, UA: NEGATIVE
Glucose, UA: NEGATIVE mg/dL
Ketones, POC UA: NEGATIVE mg/dL
Nitrite, UA: NEGATIVE
Protein Ur, POC: NEGATIVE mg/dL
Specific Gravity, Urine: 1.02
Urobilinogen, Ur: 0.2
pH, UA: 7

## 2023-04-25 MED ORDER — MELOXICAM 15 MG PO TABS: 15.0000 mg | ORAL_TABLET | Freq: Every day | ORAL | 0 refills | Status: DC

## 2023-04-25 NOTE — Patient Instructions (Addendum)
Take the meloxicam once daily with food. If it bothers your stomach, cut the does in half. Do NOT take any advil/motrin/ibuprofen/aleve/naproxen, Goody or BC powder while taking the prescription meloxicam. You MAY use extra strength tylenol, if needed. I encourage you to try SalonPas with Lidocaine as needed. You can also continue with icy hot and massage. Do the stretches for the hip/buttock as shown (crossing the left ankle to the right knee, and pressing on the left knee, and also crossing the knees, and leaning forwards). Continue the home exercises from prior physical therapy.  Call us on Monday if you'd prefer to restart physical therapy, if you're not improving.

## 2023-04-29 ENCOUNTER — Ambulatory Visit (HOSPITAL_COMMUNITY)
Admission: RE | Admit: 2023-04-29 | Discharge: 2023-04-29 | Disposition: A | Discharge status: Home / Self Care | Payer: Medicare Other | Source: Ambulatory Visit | Attending: Family Medicine | Admitting: Family Medicine

## 2023-04-29 DIAGNOSIS — I700 Atherosclerosis of aorta: Secondary | ICD-10-CM | POA: Diagnosis not present

## 2023-04-29 DIAGNOSIS — Z87891 Personal history of nicotine dependence: Secondary | ICD-10-CM | POA: Diagnosis not present

## 2023-04-29 DIAGNOSIS — J439 Emphysema, unspecified: Secondary | ICD-10-CM | POA: Diagnosis not present

## 2023-04-29 DIAGNOSIS — Z122 Encounter for screening for malignant neoplasm of respiratory organs: Secondary | ICD-10-CM

## 2023-05-06 ENCOUNTER — Telehealth: Payer: Self-pay | Admitting: Acute Care

## 2023-05-06 NOTE — Telephone Encounter (Signed)
Call report on LDCT 04/29/23:  Impression:   IMPRESSION: 1. Lung-RADS 2, benign appearance or behavior. Continue annual screening with low-dose chest CT without contrast in 12 months. 2. Aortic Atherosclerosis (ICD10-I70.0) and Emphysema (ICD10-J43.9). Coronary artery atherosclerosis.     Electronically Signed   By: Jeronimo Greaves M.D.   On: 04/27/2022 15:02  Routing to Maralyn Sago

## 2023-05-06 NOTE — Telephone Encounter (Signed)
Call report for Ms. Tiffany [name] please.    Call back is [phone]

## 2023-05-07 NOTE — Patient Instructions (Incomplete)
HEALTH MAINTENANCE RECOMMENDATIONS:  It is recommended that you get at least 30 minutes of aerobic exercise at least 5 days/week (for weight loss, you may need as much as 60-90 minutes). This can be any activity that gets your heart rate up. This can be divided in 10-15 minute intervals if needed, but try and build up your endurance at least once a week.  Weight bearing exercise is also recommended twice weekly.  Eat a healthy diet with lots of vegetables, fruits and fiber.  "Colorful" foods have a lot of vitamins (ie green vegetables, tomatoes, red peppers, etc).  Limit sweet tea, regular sodas and alcoholic beverages, all of which has a lot of calories and sugar.  Up to 1 alcoholic drink daily may be beneficial for women (unless trying to lose weight, watch sugars).  Drink a lot of water.  Calcium recommendations are 1200-1500 mg daily (1500 mg for postmenopausal women or women without ovaries), and vitamin D 1000 IU daily.  This should be obtained from diet and/or supplements (vitamins), and calcium should not be taken all at once, but in divided doses.  Monthly self breast exams and yearly mammograms for women over the age of 43 is recommended.  Sunscreen of at least SPF 30 should be used on all sun-exposed parts of the skin when outside between the hours of 10 am and 4 pm (not just when at beach or pool, but even with exercise, golf, tennis, and yard work!)  Use a sunscreen that says "broad spectrum" so it covers both UVA and UVB rays, and make sure to reapply every 1-2 hours.  Remember to change the batteries in your smoke detectors when changing your clock times in the spring and fall. Carbon monoxide detectors are recommended for your home.  Use your seat belt every time you are in a car, and please drive safely and not be distracted with cell phones and texting while driving.  Please schedule mammogram. Please get your tetanus booster (Tdap) from the pharmacy.   You should wait 2 weeks  from today's vaccine before getting it. Please get the RSV vaccine from the pharmacy in the Fall, along with your flu shot. COVID boosters are recommended.  I also recommend getting the new shingles vaccine (Shingrix). Since you have Medicare, you will need to get this from the pharmacy, as it is covered by Part D. This is a series of 2 injections, spaced 2 months apart.   This should be separated from other vaccines by at least 2 weeks.  Please monitor your blood pressure at home, and bring the list of values to your next visit.  Document on the list whether or not you had pain, or other reasons that it might be high or low (salt in your diet, forgot your medication, pain,).  Start a calcium supplement as we discussed.  I prefer you to get more from your diet, and use less pills, as calcium can sometimes be constipating.  Tums is a source of calcium.  We will refer you to physical therapy for your back.  Please go today to [facility] for x-rays ([address]). Avoid carrying a heavy purse on one shoulder--I encourage you to use a backpack purse to equally distribute the weight.  Contact the lung cancer screening clinic [facility]) if you don't hear from them in the next day or so, to discuss your results.  Please try and get exercise as we discussed.  I encourage you to discuss pulmonary rehab with your  doctors. Consider the [employer] 12 week PREP program (exercise program, with a lot of education, nutrition, etc), which is covered by your insurance (I believe).  I'm not sure if you are supposed to be taking Flovent (fluticasone) inhaler anymore, I can't find why/when this was stopped.  You had been taking it along with the stiolto and albuterol.

## 2023-05-07 NOTE — Progress Notes (Unsigned)
No chief complaint on file.  Tiffany [name] is a 77 y.o. female who presents for a complete physical and fasting labs.  She is scheduled for AWV later this month.  She was last seen 5/30 with L sided LBP after a fall. She was prescribed meloxicam, discussed heat, stretches, and to contact us for referral for PT if not improving. Encouraged her to resume HEP from prior PT. Today she reports that her back pain ***  At her visit in 09/2022 she complained of heartburn--feeling food come up into her throat, and burning in her throat. She was taking Tums 2-3 per day. No dysphagia. We had discussed dietary changes, and using Pepcid once or twice daily if she had ongoing reflux symptoms despite those changes. Today she reports ***  Hypertension. She is compliant with taking losartan 50mg . She denies side effects, headaches, palpitations, edema, shortness of breath (other than from COPD).   BP's haven't been checked at home recently.   BP Readings from Last 3 Encounters:  04/25/23 134/80  02/12/23 (!) 140/60  11/13/22 110/60    She had seen cardiology in 12/2021 due to DOE. 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 DOE felt to be related to her lungs.   Hyperlipidemia: She previously took simvastatin, as she was noted to have aortic atherosclerosis on CT for lung cancer screening (and calcified atherosclerotic plaque in RCA). Dr. Flora Lipps changed her simvastatin to rosuvastatin 10mg  in 01/2022, and lipids were at goal on this regimen in 03/2022.  She is tolerating this without side effects, takes CoQ10 daily.  She has occasional foot cramps, unchanged. Due for repeat lipids   Lab Results  Component Value Date   CHOL 125 04/24/2022   HDL 57 04/24/2022   LDLCALC 53 04/24/2022   TRIG 78 04/24/2022   CHOLHDL 2.2 04/24/2022    Osteopenia--She previously took  bisphosphonates (changed from fosamax (due to nausea), then both Actonel and Boniva, which she tolerated but stopped due to cost issues, and possibly had some heartburn).   Calcium intake--she doesn't take any supplements, other than the Tums she has been taking recently for heartburn.   UPDATE *** She has whole milk with cereal (1-2x/week), eats macaroni and cheese once a week. She quit smoking 08/2018. Vitamin D deficiency has been an ongoing issue (see below). DEXA was rechecked 09/2022 and was stable, with T-1.9 at L fem neck (prior was 03/2020, T-2.1 at left fem neck).    Vitamin D deficiency:  Last level was  normal at 39.9 in 09/2022, when taking 1000 IU of D3 daily. She had consistently low levels prior to this. She is taking D3 1000 IU daily.  Component Ref Range & Units 6 mo ago (10/22/22) 1 yr ago (04/16/22) 1 yr ago (09/21/21) 2 yr ago (03/08/21) 2 yr ago (08/08/20) 3 yr ago (06/22/19) 4 yr ago (12/03/18)  Vit D, 25-Hydroxy 30.0 - 100.0 ng/mL 39.9 24.8 Low  CM 23.3 Low  CM 24.6 Low  CM 23.3 Low  CM 29.1 Low  CM 24.6 Low  CM     Pre-diabetes:  Last A1c was 6.1% in 09/2022.  At that time she reported having dum-dum lollipops after meals.  She otherwise reported trying to limit her sweets. Not a lot of bread (unless he makes biscuits), +potatoes, spaghetti and mac and cheese occasionally. Eats low sodium potato chips (doesn't buy  these all the time).    Lab Results  Component Value Date   HGBA1C 6.1 (A) 10/22/2022    COPD: She is under the care of pulmonary. Doing well on Stiolto and Flovent, using albuterol 2-3 times a day. Compliant with nightly O2, which helps with shortness of breath. Shortness of breath is stable (with stairs, driveway carrying groceries).  She gets yearly CT screening for lung cancer. She had this last week.  IMPRESSION: 1. New part solid masslike lesion in the posterior segment right upper lobe. Lung-RADS 4B, suspicious. Additional imaging evaluation or  consultation with Pulmonology or Thoracic Surgery recommended. An infectious/inflammatory etiology is not excluded. Consider short-term follow-up CT chest without contrast in 2-3 weeks, as clinically indicated. These results will be called to the ordering clinician or representative by the Radiologist Assistant, and communication documented in the PACS or [employer]. 2.  Aortic atherosclerosis (ICD10-I70.0). 3.  Emphysema (ICD10-J43.9).   Immunization History  Administered Date(s) Administered   Fluad Quad(high Dose 65+) 09/15/2019, 08/08/2020, 09/21/2021, 08/15/2022   Influenza Split 10/16/2012   Influenza, High Dose Seasonal PF 09/02/2013, 08/25/2014, 08/31/2015, 11/01/2016, 09/09/2017, 09/09/2018   PFIZER(Purple Top)SARS-COV-2 Vaccination 07/01/2020, 07/22/2020   Pneumococcal Conjugate-13 08/25/2014   Pneumococcal Polysaccharide-23 12/12/2009, 08/31/2015   Tdap 12/12/2009   Last Pap smear: s/p hyst in 20's   Last mammogram: 04/2021 Last colonoscopy: 11/2010 Dr. Laural Benes. She got a letter a couple of years ago stating she was due, but didn't go. She declines repeating colonoscopy (unless she has symptoms). Last DEXA: 03/2020 T-2.1 L femoral neck; scheduled for later this month Dentist: doesn't go, has dentures   Ophtho: yearly Exercise: "not much"   PMH, PSH, SH and FH reviewed    ROS: The patient denies anorexia, fever, headaches, vision changes, decreased hearing, ear pain, sore throat, breast concerns, chest pain, dizziness, syncope, swelling, nausea, vomiting, diarrhea, constipation, abdominal pain, melena, hematochezia, hematuria, incontinence, dysuria, vaginal bleeding, discharge, odor or itch, genital lesions, weakness, tremor, suspicious skin lesions, depression, anxiety, abnormal bleeding/bruising, or enlarged lymph nodes.  ome intermittent itchy ears (ongoing/chronic, sometimes wakes her up at night) SOB only with stairs and carrying groceries uphill,  unchanged. Chronic cough -- thick, clear phlegm (less frequent since quitting smoking).  Has heartburn with certain foods, such as pizza. Tums prn helps. 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    PHYSICAL EXAM:  There were no vitals taken for this visit.  Wt Readings from Last 3 Encounters:  04/25/23 151 lb 3.2 oz (68.6 kg)  02/12/23 158 lb (71.7 kg)  11/13/22 159 lb 2.8 oz (72.2 kg)     General Appearance:   Alert, cooperative, no distress, appears stated age. Speaks easily in full sentences, in no distress   Head:   Normocephalic, without obvious abnormality, atraumatic    Eyes:   PERRL, conjunctiva/corneas clear, EOM's intact, fundi not well visualized, somewhat mioitic pupils   Ears:   Normal TM's and external ear canals  Nose:   Normal, no drainage  Throat:   Upper and lower dentures present. Visualized mucosa is normal  Neck:   Supple, no lymphadenopathy; thyroid: no enlargement/ tenderness/nodules; no carotid bruit or JVD    Back:   Spine nontender, no curvature, ROM normal, no CVA tenderness.   Lungs:   Decreased air movement/distant, but otherwise clear.  No wheezes, rales, ronchi  Chest Wall:   No tenderness or deformity    Heart:   Regular rate and rhythm, S1  and S2 normal, no murmur, rub or gallop    Breast Exam:   No masses or nipple discharge or inversion. No axillary lymphadenopathy. No masses or tenderness.  Abdomen:   Soft, non-tender, nondistended, normoactive bowel sounds, no masses, no hepatosplenomegaly    Genitalia:   Normal external genitalia without lesions. BUS and vagina normal, some atrophic changes. 1 larger and 1 very small inclusion cyst noted on L labia, nontender. No abnormal vaginal discharge. Uterus absent. No adnexal masses or tenderness. Pap not performed    Rectal:   Normal tone, no masses or tenderness; guaiac negative stool    Extremities:   No clubbing, cyanosis or edema.  Pulses:   Slightly  diminished, symmetric. Feet are warm, brisk cap refill   Skin:   Turgor normal, no rashes. Significant actinic changes to skin. Skin is dry. Some mild bruising/purpura on arms (L>R), and large purpura on LE's, L>R. There is a hyperkeratotic/rough patch at the dorsum of R wrist--smoother and less raised than in past. Scab at L ulnar styloid area from recent biopsy. Nonpigmented SK's on back. Hyperpigmentation of shins bilaterally.  Lymph nodes:   Cervical, supraclavicular, inguinal and axillary nodes normal    Neurologic:   CNII-XII intact, normal strength, sensation and gait; reflexes 2+ and symmetric throughout                        Psych:   Normal mood, affect, hygiene and grooming   ASSESSMENT/PLAN:  DID SHE GET A CALL FROM THE LUNG CANCER SCREENING CLINIC WITH HER CT RESULTS YESTERDAY? Abnormal  Add D level if not taking 1000 IU daily She has BCBS so I deleted A1c, can only do once yearly for pre-diabetes TSH can only be done for symptoms. Last normal 11/2018  Lab Results  Component Value Date   TSH 3.700 12/03/2018     Past due for mammo Is reflux better? (How much TUms is she taking? Taking pepcid?)  Did she ever get Tdap?, Shingrix? Did she get RSV last fall?  Rec Prevnar-20 today, Tdap from pharmacy in 2 weeks. Can start Shingrix 2 weeks after that, and get RSV and flu shot in the Fall. Offer/decline COVID  Discussed monthly self breast exams and yearly mammogram (past due, reminded to schedule); at least 30 minutes of aerobic activity at least 5 days/week, weight-bearing exercise 2x/wk; proper sunscreen use reviewed; healthy diet, including goals of calcium and vitamin D intake and alcohol recommendations (less than or equal to 1 drink/day) reviewed; regular seatbelt use; changing batteries in smoke detectors. Immunization recommendations discussed-- continue yearly high dose flu shots.  Prevnar-20 RSV vaccine recommended in the Fall.  Tdap and Shingrix again recommended,  to get from pharmacy.  COVID booster recommended, declined. Colonoscopy recommendations reviewed, declines any screening at this time, aware of potential risks of later dx of cancer.  F/u as scheduled for AWV

## 2023-05-08 ENCOUNTER — Encounter: Payer: Self-pay | Admitting: Family Medicine

## 2023-05-08 ENCOUNTER — Ambulatory Visit (INDEPENDENT_AMBULATORY_CARE_PROVIDER_SITE_OTHER): Payer: Medicare Other | Admitting: Family Medicine

## 2023-05-08 ENCOUNTER — Ambulatory Visit
Admission: RE | Admit: 2023-05-08 | Discharge: 2023-05-08 | Disposition: A | Discharge status: Home / Self Care | Payer: Medicare Other | Source: Ambulatory Visit | Attending: Family Medicine | Admitting: Family Medicine

## 2023-05-08 ENCOUNTER — Telehealth: Payer: Self-pay | Admitting: Acute Care

## 2023-05-08 VITALS — BP 132/70 | HR 80 | Ht 66.5 in | Wt 152.6 lb

## 2023-05-08 DIAGNOSIS — Z23 Encounter for immunization: Secondary | ICD-10-CM

## 2023-05-08 DIAGNOSIS — R7303 Prediabetes: Secondary | ICD-10-CM

## 2023-05-08 DIAGNOSIS — M545 Low back pain, unspecified: Secondary | ICD-10-CM

## 2023-05-08 DIAGNOSIS — M8580 Other specified disorders of bone density and structure, unspecified site: Secondary | ICD-10-CM

## 2023-05-08 DIAGNOSIS — E7800 Pure hypercholesterolemia, unspecified: Secondary | ICD-10-CM

## 2023-05-08 DIAGNOSIS — Z5181 Encounter for therapeutic drug level monitoring: Secondary | ICD-10-CM

## 2023-05-08 DIAGNOSIS — Z0000 Encounter for general adult medical examination without abnormal findings: Secondary | ICD-10-CM | POA: Diagnosis not present

## 2023-05-08 DIAGNOSIS — I10 Essential (primary) hypertension: Secondary | ICD-10-CM

## 2023-05-08 DIAGNOSIS — E559 Vitamin D deficiency, unspecified: Secondary | ICD-10-CM

## 2023-05-08 DIAGNOSIS — D692 Other nonthrombocytopenic purpura: Secondary | ICD-10-CM

## 2023-05-08 DIAGNOSIS — G4734 Idiopathic sleep related nonobstructive alveolar hypoventilation: Secondary | ICD-10-CM

## 2023-05-08 DIAGNOSIS — I2510 Atherosclerotic heart disease of native coronary artery without angina pectoris: Secondary | ICD-10-CM

## 2023-05-08 DIAGNOSIS — J449 Chronic obstructive pulmonary disease, unspecified: Secondary | ICD-10-CM

## 2023-05-08 DIAGNOSIS — R9389 Abnormal findings on diagnostic imaging of other specified body structures: Secondary | ICD-10-CM

## 2023-05-08 DIAGNOSIS — I700 Atherosclerosis of aorta: Secondary | ICD-10-CM

## 2023-05-08 LAB — CBC WITH DIFFERENTIAL/PLATELET
Basophils Absolute: 0 10*3/uL (ref 0.0–0.2)
Basos: 1 %
EOS (ABSOLUTE): 0.1 10*3/uL (ref 0.0–0.4)
Eos: 2 %
Hematocrit: 38 % (ref 34.0–46.6)
Hemoglobin: 12.4 g/dL (ref 11.1–15.9)
Immature Grans (Abs): 0 10*3/uL (ref 0.0–0.1)
Immature Granulocytes: 0 %
Lymphocytes Absolute: 1 10*3/uL (ref 0.7–3.1)
Lymphs: 13 %
MCH: 30.2 pg (ref 26.6–33.0)
MCHC: 32.6 g/dL (ref 31.5–35.7)
MCV: 93 fL (ref 79–97)
Monocytes Absolute: 0.6 10*3/uL (ref 0.1–0.9)
Monocytes: 8 %
Neutrophils Absolute: 5.5 10*3/uL (ref 1.4–7.0)
Neutrophils: 76 %
Platelets: 207 10*3/uL (ref 150–450)
RBC: 4.1 x10E6/uL (ref 3.77–5.28)
RDW: 12.2 % (ref 11.7–15.4)
WBC: 7.2 10*3/uL (ref 3.4–10.8)

## 2023-05-08 LAB — LIPID PANEL
Chol/HDL Ratio: 2.4 ratio (ref 0.0–4.4)
Cholesterol, Total: 140 mg/dL (ref 100–199)
HDL: 59 mg/dL (ref >39)
LDL Chol Calc (NIH): 66 mg/dL (ref 0–99)
Triglycerides: 77 mg/dL (ref 0–149)
VLDL Cholesterol Cal: 15 mg/dL (ref 5–40)

## 2023-05-08 LAB — CMP14+EGFR
ALT: 13 IU/L (ref 0–32)
AST: 19 IU/L (ref 0–40)
Albumin/Globulin Ratio: 1.4
Albumin: 4.2 g/dL (ref 3.8–4.8)
Alkaline Phosphatase: 147 IU/L — ABNORMAL HIGH (ref 44–121)
BUN/Creatinine Ratio: 24 (ref 12–28)
BUN: 17 mg/dL (ref 8–27)
Bilirubin Total: 0.5 mg/dL (ref 0.0–1.2)
CO2: 22 mmol/L (ref 20–29)
Calcium: 9.1 mg/dL (ref 8.7–10.3)
Chloride: 99 mmol/L (ref 96–106)
Creatinine, Ser: 0.7 mg/dL (ref 0.57–1.00)
Globulin, Total: 2.9 g/dL (ref 1.5–4.5)
Glucose: 116 mg/dL — ABNORMAL HIGH (ref 70–99)
Potassium: 4.3 mmol/L (ref 3.5–5.2)
Sodium: 137 mmol/L (ref 134–144)
Total Protein: 7.1 g/dL (ref 6.0–8.5)
eGFR: 90 mL/min/{1.73_m2} (ref >59)

## 2023-05-08 MED ORDER — LOSARTAN POTASSIUM 50 MG PO TABS: 50.0000 mg | ORAL_TABLET | Freq: Every day | ORAL | 3 refills | Status: DC

## 2023-05-08 MED ORDER — KETOROLAC TROMETHAMINE 60 MG/2ML IM SOLN
60.0000 mg | Freq: Once | INTRAMUSCULAR | Status: AC
  Administered 2023-05-08: 60 mg via INTRAMUSCULAR

## 2023-05-08 MED ORDER — KETOROLAC TROMETHAMINE 60 MG/2ML IM SOLN: 60.0000 mg | Freq: Once | INTRAMUSCULAR | Status: DC

## 2023-05-08 MED ORDER — ROSUVASTATIN CALCIUM 10 MG PO TABS: 10.0000 mg | ORAL_TABLET | Freq: Every day | ORAL | 3 refills | Status: DC

## 2023-05-08 NOTE — Telephone Encounter (Signed)
I have called the patient with the results of her low-dose screening CT.  I explained that her scan was read as a lung RADS 4B as there is a new part solid masslike lesion in the posterior segment of the right upper lobe.  This has grown in the 12 month interval since her last screening scan.  It measures 28.6 mm with an internal solid-appearing component of 16.8 mm.  Additionally there is a new groundglass nodule in the superior segment of the right lower lobe that measures approximately 15.2 mm. I spoke with the patient and she states she has not been sick and is not currently sick.. This is a patient of Dr. Everardo All.  I have sent Dr. Everardo All the scan results and she has requested that the patient be put on her schedule and one of her blocked nodule slots as soon as possible. I did explain to the patient that she will get a call to determine best follow-up plan for this change in her scan. She was in agreement with follow-up to better evaluate the nodule. Dr. Everardo All, when she see the patient please let us know plan so that we can ensure continuous follow-up of the lung cancer screening patients. Thank you so much

## 2023-05-09 ENCOUNTER — Encounter (HOSPITAL_BASED_OUTPATIENT_CLINIC_OR_DEPARTMENT_OTHER): Payer: Self-pay | Admitting: Pulmonary Disease

## 2023-05-09 ENCOUNTER — Ambulatory Visit (HOSPITAL_BASED_OUTPATIENT_CLINIC_OR_DEPARTMENT_OTHER): Payer: Medicare Other | Admitting: Pulmonary Disease

## 2023-05-09 VITALS — BP 136/68 | HR 89 | Temp 98.9°F | Ht 67.0 in | Wt 151.6 lb

## 2023-05-09 DIAGNOSIS — R911 Solitary pulmonary nodule: Secondary | ICD-10-CM

## 2023-05-09 NOTE — Progress Notes (Signed)
Subjective:   [name] GENDER: female DOB: December 21, 1945, MRN: [id number]   HPI  Chief Complaint  Patient presents with   Follow-up    Discuss CT scan   Ms. Tiffany [name] is 77 year old female former smoker with severe COPD who presents for follow-up.  Synopsis: Well-controlled COPD on Stioloto however symptoms worsen over fall/winter. Trialed ICS addition however no improvement so discontinued. Last COPD exacerbation requiring hospitalization 08/2020. She is not intentionally active but will climb stairs for laundry.  01/08/22 Since our last visit, we added ICS but no improvement perceived by patient. She continues to have shortness of breath which is her primary issue. Denies wheezing or cough. No recent fever or chills. She has been using her husband's oxygen for shortness of breath that will abruptly occur. Last night after cooking she had midsternal chest dullness/tightness/pressure associated with shortness of breath for a few hours. She did not check her oxygen level but used oxygen with improvement. Denies any radiation of pain to jaw or arm. She had an episode similar to this a few weeks ago after cleaning her house.  04/10/22 Since our last visit, she has good days and bad days with her breathing. She is compliant with Stiolto once a and Flovent twice a day. Still needing albuterol 2-3 times a day. Her activity is not limited and able around the house and perform ADLs. She reports she wears her husbands oxygen at times and improves her dyspnea. Reports some wheezing after cleaning her car and garage yesterday. Symptoms worsened overnight.   08/15/22 Has not been on Stiolto for several months and reports that she did not hear back from patient assistance. Has previously been approved in the past. On Flovent twice a day. Uses albuterol 2-3 times a day. Reports shortness of breath and chest tightness in the evening. Compliant with nightly O2 and feels this help.  Activity not limited.   11/13/22 Since our last visit she has been on [employer] and Flovent. Improved but still has persistent shortness of breath and wheezing with activity. Denies exacerbations requiring steroids or antibiotics since our last visit. She received a letter that Flovent would no longer be provided next year.  02/12/23 Has been compliant with Stiolto. Has not started Asmanex as she was unable to remove the cap. Using albuterol 2-3 times a day for coughing and wheezing. Shortness of breath with exertion. Denies steroids or antibiotics since last visit. Compliant with oxygen at night. Will monitor her oxygen during the day with lowers 87%. Especially when she does laundry downstairs.  05/09/23 Since our last visit she reports she fell in the backyard on a slope. She was seen by PCP for x-ray and plan for rehab. She reports unchanged shortness of breath with activity. Some wheezing and cough. She is compliant with Stiolto and uses albuterol 1-2 times a day. Has not started mometasone. She presents today to discuss abnormal CT results  Social History: Former smoker.  Quit 08/2018.  50 pack years Son lives in [place]  Past Medical History:  Diagnosis Date   Abnormal PFT 09/02/2014   moderate restrictive ventilatory defect.   BCC (basal cell carcinoma), face 2010   Dr. Terri Piedra   Colon polyp 12/2003   Diverticulosis 11/2010   seen on colonoscopy and air contrast BE   Emphysema of lung (HCC)    Hyperlipidemia    Hypertension    previously treated with meds, resolved   MGUS (monoclonal gammopathy of unknown significance)  1999   prevously under care of Dr. 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 8 g 6   cholecalciferol (VITAMIN D3) 25 MCG (1000 UNIT) tablet Take 1,000 Units by mouth daily.     Coenzyme Q10 (COQ10) 100 MG CAPS Take 1 capsule by mouth daily.     losartan (COZAAR) 50 MG tablet Take 1 tablet (50 mg total) by mouth daily. 90 tablet 3   meloxicam (MOBIC) 15 MG tablet Take 1 tablet (15 mg total) by mouth daily. Take with food.  Take until your pain has resolved 15 tablet 0   rosuvastatin (CRESTOR) 10 MG tablet Take 1 tablet (10 mg total) by mouth daily. 90 tablet 3   Tiotropium Bromide-Olodaterol (STIOLTO RESPIMAT) 2.5-2.5 MCG/ACT AERS Inhale 2 puffs into the lungs daily. 4 g 11   Facility-Administered Medications Prior to Visit  Medication Dose Route Frequency Provider Last Rate Last Admin   ketorolac (TORADOL) injection 60 mg  60 mg Intramuscular Once Joselyn Arrow, MD        Review of Systems  Constitutional:  Negative for chills, diaphoresis, fever, malaise/fatigue and weight loss.  HENT:  Negative for congestion.   Respiratory:  Positive for cough, shortness of breath and wheezing. Negative for hemoptysis and sputum production.   Cardiovascular:  Negative for chest pain, palpitations and leg swelling.    Objective:   Vitals:   05/09/23 1441  BP: 136/68  Pulse: 89  Temp: 98.9 F (37.2 C)  TempSrc: Oral  SpO2: 90%  Weight: 151 lb 9.6 oz (68.8 kg)  Height: 5\' 7"  (1.702 m)    SpO2: 90 % O2 Device: None (Room air)  Physical Exam: General: Well-appearing, no acute distress HENT: [place], AT Eyes: EOMI, no scleral icterus Respiratory: Clear to auscultation bilaterally.  No crackles, wheezing or rales Cardiovascular: RRR, -M/R/G, no JVD Extremities:-Edema,-tenderness Neuro: AAO x4, CNII-XII grossly intact Psych: Normal mood, normal affect  Chest imaging: CXR 09/10/2018- hyperinflated lung fields.  No acute infiltrate/effusion or edema CT  Chest Lung 01/12/19 - Emphysema, unchanged pulmonary nodules CT Chest Lung Screen 01/26/20 - Unchanged pulmonary nodules, larges 3.78mm in RML. Background emphysema CT Chest Lung Screen 02/14/21 - Centrilobular and paraseptal emphysema. Biapical pleuroparenchymal scarring. Tiny pulmonary nodules, unchanged. CXR 01/08/21 - Increased coarse interstitial markings CT Chest Lung Screen 04/26/22 - Moderate centrilobular and paraseptal emphysema. Pulmonary nodules unchanged with largest 4.7 mm CT Chest Lung Screen 04/29/23 - Centrilobular and paraseptal emphysema. New mass like lesion in the posterior RUL measuring 28.43mm with solid component 16.8 mm and RLL ground glass nodule 15.2 mm  PFT: 12/02/2017-severe obstructive defect. Ratio 58 FEV1 37 FVC 48  EKG 01/08/22 - No acute ST changes or TWI. Prior anterior infarct.    Assessment & Plan:  77 year old female former smoker with severe COPD, nocturnal hypoxemia, MGUS, HTN who presents for follow-up. Remains symptomatic on LAMA/LABA however on prior attempts to use additional ICS patient does not pick up med or consistently use. We again discussed adding ICS component and she  prefers to keep her current regimen. Ambulatory O2 was not completed due to back pain - no desaturations noted. We reviewed CT scan in clinic and I addressed questions and concerns. New interval GGO with solid component compared to prior imaging concerning for infectious/inflammatory and possibly malignant etiology. We discussed PET and potential bronchoscopy. We discussed risks and benefits with her known chronic lung disease. Will await results of PET and further discuss whether to pursue bronchoscopy.  RUL lung nodule 2.8 cm, new compared to 04/2022 --Reviewed scan. Discussed risk of malignancy 12.3% [facility] (limited to solitary nodules) --After discussion will plan to pursue PET/CT in 3-4 weeks to evaluate for persistent lesion and activity level  Severe COPD with emphysema,  GOLD Class B - persistent symptoms on LAMA/LABA --Reviewed inhaler technique. --CONTINUE Stiolto TWO puffs ONCE a day --CONTINUE Albuterol as needed for shortness of breath or wheezing  Nocturnal hypoxemia --CONTINUE nightly oxygen at 2L  Health Maintenance Immunization History  Administered Date(s) Administered   Fluad Quad(high Dose 65+) 09/15/2019, 08/08/2020, 09/21/2021, 08/15/2022   Influenza Split 10/16/2012   Influenza, High Dose Seasonal PF 09/02/2013, 08/25/2014, 08/31/2015, 11/01/2016, 09/09/2017, 09/09/2018   PFIZER(Purple Top)SARS-COV-2 Vaccination 07/01/2020, 07/22/2020   PNEUMOCOCCAL CONJUGATE-20 05/08/2023   Pneumococcal Conjugate-13 08/25/2014   Pneumococcal Polysaccharide-23 12/12/2009, 08/31/2015   Tdap 12/12/2009   CT Lung Screen - Enrolled  Orders Placed This Encounter  Procedures   NM PET Image Initial (PI) Skull Base To Thigh    Standing Status:   Future    Standing Expiration Date:   05/08/2024    Scheduling Instructions:     Schedule in 3-4 weeks    Order Specific Question:   If indicated for the ordered procedure, I authorize the administration of a radiopharmaceutical per Radiology protocol    Answer:   Yes    Order Specific Question:   Preferred imaging location?    Answer:   [facility]   No orders of the defined types were placed in this encounter.  Return in about 1 month (around 06/08/2023) for after PET scan.   I have spent a total time of 45-minutes on the day of the appointment including chart review, data review, collecting history, coordinating care and discussing medical diagnosis and plan with the patient/family. Past medical history, allergies, medications were reviewed. Pertinent imaging, labs and tests included in this note have been reviewed and interpreted independently by me.   Mechele Collin, MD  Pulmonary Critical Care 05/09/2023 4:18 PM

## 2023-05-09 NOTE — Telephone Encounter (Signed)
See telephone note from 05/08/23

## 2023-05-09 NOTE — Patient Instructions (Signed)
RUL lung nodule 2.8 cm, new compared to 04/2022 --Reviewed scan. Discussed risk of malignancy 12.3% [facility] (limited to solitary nodules) --After discussion will ORDER PET/CT in 3-4 weeks to evaluate for persistent lesion and activity level  Severe COPD with emphysema, GOLD Class B - persistent symptoms on LAMA/LABA --Reviewed inhaler technique. --CONTINUE Stiolto TWO puffs ONCE a day --CONTINUE Albuterol as needed for shortness of breath or wheezing  Nocturnal hypoxemia --CONTINUE nightly oxygen at 2L

## 2023-05-14 NOTE — Therapy (Signed)
OUTPATIENT PHYSICAL THERAPY THORACOLUMBAR EVALUATION   Patient Name: Tiffany [name] MRN: [id number] DOB:14-Jun-1946, 77 y.o., female Today's Date: 05/16/2023  END OF SESSION:  PT End of Session - 05/16/23 1535     Visit Number 1    Number of Visits 12    Date for PT Re-Evaluation 06/27/23    PT Start Time 1420    PT Stop Time 1505    PT Time Calculation (min) 45 min    Activity Tolerance Patient limited by pain;Patient tolerated treatment well    Behavior During Therapy [facility] for tasks assessed/performed             Past Medical History:  Diagnosis Date   Abnormal PFT 09/02/2014   moderate restrictive ventilatory defect.   BCC (basal cell carcinoma), face 2010   Dr. Terri Piedra   Colon polyp 12/2003   Diverticulosis 11/2010   seen on colonoscopy and air contrast BE   Emphysema of lung (HCC)    Hyperlipidemia    Hypertension    previously treated with meds, resolved   MGUS (monoclonal gammopathy of unknown significance) 1999   prevously under care of Dr. 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 Laural Benes. Diverticulosis. BE also done--normal   OMENTECTOMY  11/26/2005   and pelvic lymphadenectomy (with BSO)   SKIN CANCER EXCISION Left 07/2021   neck; pt can't recall type of cancer   TONSILLECTOMY  age 27   VAGINAL HYSTERECTOMY  late 20's   Patient Active Problem List   Diagnosis Date Noted   Nocturnal hypoxemia 08/15/2022   Chest pain on exertion 01/08/2022   Aortic atherosclerosis (HCC) 06/03/2018   Atherosclerosis of right coronary artery  06/03/2018   Essential hypertension 12/31/2016   MGUS (monoclonal gammopathy of unknown significance) 10/30/2016   Senile purpura (HCC) 08/31/2015   COPD, 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   Osteopenia 05/12/2012   Vitamin D deficiency 04/28/2012   Pure hypercholesterolemia 04/28/2012   Former smoker 04/03/2012    PCP: Joselyn Arrow, MD   REFERRING PROVIDER: Joselyn Arrow, MD   REFERRING DIAG: M54.50 (ICD-10-CM) - Acute left-sided low back pain without sciatica   Rationale for Evaluation and Treatment: Rehabilitation  THERAPY DIAG:  Chronic bilateral low back pain with left-sided sciatica - Plan: PT plan of care cert/re-cert  Muscle weakness (generalized) - Plan: PT plan of care cert/re-cert  Pain in left hip - Plan: PT plan of care cert/re-cert  Abnormal posture - Plan: PT plan of care cert/re-cert  ONSET DATE: 04-14-23 Mothers day  SUBJECTIVE:  SUBJECTIVE STATEMENT: Pt reports falling a week after Mothers Day 2024.  My backyard slopes and I tripped in the yard.   PERTINENT HISTORY:  Osteopenia, tobacco abuse, basal cell carcinoma face, diverticulosis, emphysema, HTN Hyperlipidemia, hysterectomy. See Med hx for full history COPD,      PAIN:  Are you having pain? Yes: NPRS scale: at rest is a 7/10 and at worst 08/05/09 Pain location: Left low back pain into my buttock to my knee Pain description: sharp pain Aggravating factors: getting in and out of shower, driving, sleeping and turning over, can't do housework or dishes or laundry because my washer and dishwasher are downstairs.  Relieving factors: nothing makes it better  PRECAUTIONS: None  WEIGHT BEARING RESTRICTIONS: No  FALLS:  Has patient fallen in last 6 months? Yes. Number of falls 1 after mothers day   LIVING ENVIRONMENT: Lives with:  lives with their spouse Lives in: House/apartment Stairs: Yes: Internal: 12 steps; on right going up and External: 3 steps; can reach both Has following equipment at home: Single point cane, Walker - 2 wheeled, and uses a stool to prop leg when cooking  OCCUPATION: retired  PLOF: Independent  not using a cane  PATIENT GOALSMake back better so I can do stuff:   NEXT MD VISIT: wellness check 05-22-23  OBJECTIVE:   DIAGNOSTIC FINDINGS:  EXAM:6-12-02-22 LUMBAR SPINE - COMPLETE 4+ VIEW   COMPARISON:  None Available.   FINDINGS: Moderate levoscoliosis of lumbar spine is noted. Moderate lateral subluxation of L3 on L4 to the left is noted. No fracture or spondylolisthesis is noted. Severe degenerative disc disease is noted at L2-3 and L3-4.   IMPRESSION: Severe multilevel degenerative disc disease. No acute abnormality seen. Moderate levoscoliosis of lumbar spine is noted.   Aortic Atherosclerosis (ICD10-I70.0).  PATIENT SURVEYS:  FOTO 31%  52% predicted  SCREENING FOR RED FLAGS: Bowel or bladder incontinence: No   COGNITION: Overall cognitive status: Within functional limits for tasks assessed     SENSATION: WFL  but pain radiating (sharp) into left buttock down to knee  MUSCLE LENGTH: Hamstrings: Right 69 deg; Left 70 deg   POSTURE: rounded shoulders, forward head, and scoliosis Levo scoliosis Left SIJ posterior pelvic rotation and Side bend posture Left standing .  PALPATION: Left paraspinals with increased tension, TTP on LT buttock and LT SIJ  LUMBAR ROM:   AROM eval  Flexion Limited 75% fingertips to thigh  Extension Limited 75%  Right lateral flexion Limited 75%  Left lateral flexion Limited 75%  Right rotation Limited 50%  Left rotation Limited 50%   (Blank rows = not tested)  LOWER EXTREMITY ROM:     Active  Right eval Left eval  Hip flexion 50 deg  50 Deg p!  Hip extension    Hip abduction    Hip adduction    Hip internal rotation 30 21 p!   Hip external rotation 35 29 p!  Knee flexion    Knee extension    Ankle dorsiflexion    Ankle plantarflexion    Ankle inversion    Ankle eversion     (Blank rows = not tested)  LOWER EXTREMITY MMT:   Not tested due to pt in 7-9/10 pain  MMT Right eval Left eval  Hip flexion    Hip extension    Hip abduction    Hip adduction    Hip internal rotation    Hip external rotation    Knee flexion    Knee extension  Ankle dorsiflexion    Ankle plantarflexion    Ankle inversion    Ankle eversion     (Blank rows = not tested)  LUMBAR SPECIAL TESTS:  Straight leg raise test: Negative and Slump test: Positive  FUNCTIONAL TESTS:  5 times sit to stand: 45.3 sec  GAIT: Distance walked: 150 Assistive device utilized: Single point cane Level of assistance: Modified independence Comments: Pt with slow cadenced and  R lean  TODAY'S TREATMENT:                                                                                                                              DATE: 05-16-23 EVal    PATIENT EDUCATION:  Education details: POC Explanation of findings, issue HEP Person educated: Patient Education method: Explanation, Demonstration, Tactile cues, Verbal cues, and Handouts Education comprehension: verbalized understanding, returned demonstration, tactile cues required, and needs further education  HOME EXERCISE PROGRAM:  Access Code: [email or web address] URL: [email or web address] Date: 05/16/2023 Prepared by: Garen Lah  Exercises - Supine Pelvic Tilt  - 1 x daily - 7 x weekly - 3 sets - 10 reps - Supine Single Knee to Chest Stretch  - 2 x daily - 7 x weekly - 1 sets - 5 reps - 10 hold - Supine Bridge  - 1 x daily - 7 x weekly - 2 sets - 10 reps - Sit to stand with sink support Movement snack  - 1 x daily - 7 x weekly - 3 sets - 5-10 reps ASSESSMENT:  CLINICAL IMPRESSION: Patient is a 77 y.o. female who was seen today for physical therapy evaluation and  treatment for chronic low back pain with radiating pain down  from buttock to left knee. Pt fell on 04-14-23.  Pt with left  anterior innominate   OBJECTIVE IMPAIRMENTS: decreased activity tolerance, decreased mobility, difficulty walking, decreased ROM, decreased strength, postural dysfunction, and pain.   ACTIVITY LIMITATIONS: carrying, lifting, bending, sitting, standing, squatting, transfers, bathing, reach over head, and locomotion level  PARTICIPATION LIMITATIONS: meal prep, cleaning, laundry, driving, and shopping  PERSONAL FACTORS: Osteopenia, tobacco abuse, basal cell carcinoma face, diverticulosis, emphysema, HTN Hyperlipidemia, hysterectomy. See Med hx for full history COPD,  are also affecting patient's functional outcome.   REHAB POTENTIAL: Fair    CLINICAL DECISION MAKING: Unstable/unpredictable  EVALUATION COMPLEXITY: Moderate   GOALS: Goals reviewed with patient? No  SHORT TERM GOALS: Target date: 06-06-23  Pt will be independent with initial HEP Baseline:limited knowledge Goal status: INITIAL  2.  5 x STS improves to at least 30 sec Baseline: 45.3 eval Goal status: INITIAL  3.  Pt will be able to stand with upright posture and decreased pain to at least 5/10 Baseline: unable to stand upright and pain at 9/10 Goal status: INITIAL  4.  Pt will be able to verbalize 3 ways to decrease pain using heat cold /modalities at home for pain control Baseline: no knowledge Goal status: INITIAL    LONG  TERM GOALS: Target date: 06-27-23  Pt will be independent with advanced HEP Baseline: limited knowledge Goal status: INITIAL  2.  5 x STS imrproves to at least 20 sec to show increased LE strength and decrease risk of fall Baseline: STS 45.3.sec Goal status: INITIAL  3.  Pt will be able to bend and lift to perform household dutes with proper form with increase in pain Baseline: Unable to bend forward more than 25 % Goal status: INITIAL  4.  Pt will be able to  ambulate I without AD  Baseline: reliant on SPC at eval and limited walking ability Goal status: INITIAL  5.  FOTO will improve from 31%   to 52%     indicating improved functional mobility Baseline: 31% eval Goal status: INITIAL  6.  Pt will be able to negotiate steps with pain no greater than 3/10 Baseline: avoiding steps Goal status: INITIAL  PLAN:  PT FREQUENCY: 2x/week  PT DURATION: 6 weeks  PLANNED INTERVENTIONS: Therapeutic exercises, Therapeutic activity, Neuromuscular re-education, Balance training, Gait training, Patient/Family education, Self Care, Joint mobilization, Stair training, Dry Needling, Electrical stimulation, Spinal mobilization, Cryotherapy, Moist heat, Taping, Ionotophoresis 4mg /ml Dexamethasone, Manual therapy, and Re-evaluation.  PLAN FOR NEXT SESSION: SIJ Lt mobs   Garen Lah, PT, [facility] Certified Exercise Expert for the Aging Adult  05/16/23 3:43 PM Phone: [phone] Fax: [phone]

## 2023-05-16 ENCOUNTER — Ambulatory Visit: Payer: Medicare Other | Attending: Family Medicine | Admitting: Physical Therapy

## 2023-05-16 ENCOUNTER — Other Ambulatory Visit: Payer: Self-pay

## 2023-05-16 DIAGNOSIS — M25552 Pain in left hip: Secondary | ICD-10-CM | POA: Diagnosis not present

## 2023-05-16 DIAGNOSIS — M6281 Muscle weakness (generalized): Secondary | ICD-10-CM | POA: Insufficient documentation

## 2023-05-16 DIAGNOSIS — M545 Low back pain, unspecified: Secondary | ICD-10-CM | POA: Diagnosis not present

## 2023-05-16 DIAGNOSIS — R293 Abnormal posture: Secondary | ICD-10-CM | POA: Insufficient documentation

## 2023-05-16 DIAGNOSIS — M5442 Lumbago with sciatica, left side: Secondary | ICD-10-CM | POA: Insufficient documentation

## 2023-05-16 DIAGNOSIS — G8929 Other chronic pain: Secondary | ICD-10-CM | POA: Insufficient documentation

## 2023-05-20 NOTE — Therapy (Signed)
OUTPATIENT PHYSICAL THERAPY TREATMENT NOTE   Patient Name: Tiffany [name] MRN: [id number] DOB:[birth year], 77 y.o., female Today's Date: 05/21/2023  END OF SESSION:  PT End of Session - 05/21/23 1329     Visit Number 2    Number of Visits 12    Date for PT Re-Evaluation 06/27/23    Authorization Type [employer] medicare    Progress Note Due on Visit 10    PT Start Time 1330    PT Stop Time 1413    PT Time Calculation (min) 43 min    Activity Tolerance Patient limited by pain;Patient tolerated treatment well    Behavior During Therapy [facility] for tasks assessed/performed              Past Medical History:  Diagnosis Date   Abnormal PFT 09/02/2014   moderate restrictive ventilatory defect.   BCC (basal cell carcinoma), face 2010   Dr. Terri Piedra   Colon polyp 12/2003   Diverticulosis 11/2010   seen on colonoscopy and air contrast BE   Emphysema of lung (HCC)    Hyperlipidemia    Hypertension    previously treated with meds, resolved   MGUS (monoclonal gammopathy of unknown significance) 1999   prevously under care of Dr. 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 Laural Benes. Diverticulosis. 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   Osteopenia 05/12/2012   Vitamin D deficiency 04/28/2012   Pure hypercholesterolemia 04/28/2012   Former smoker 04/03/2012    PCP: Joselyn Arrow, MD   REFERRING PROVIDER: Joselyn Arrow, MD   REFERRING DIAG: M54.50 (ICD-10-CM) - Acute left-sided low back pain without sciatica   Rationale for Evaluation and Treatment: Rehabilitation  THERAPY DIAG:  Chronic bilateral low back pain with left-sided sciatica  Muscle weakness (generalized)  Pain in left hip  Abnormal posture  ONSET DATE: 04-14-23 Mothers day  SUBJECTIVE:  SUBJECTIVE STATEMENT: Pt states she was doing well this morning, but increased pain after showering (8/10). Has been doing HEP daily, no issues although she notes bridging is painful.    PERTINENT HISTORY:  Osteopenia, tobacco abuse, basal cell carcinoma face, diverticulosis, emphysema, HTN Hyperlipidemia, hysterectomy. See Med hx for full history COPD,      PAIN:  Are you having pain? Yes: NPRS scale: at rest is a 7/10 and at worst 08/05/09 Pain location: Left low back pain into my buttock to my knee Pain description: sharp pain Aggravating factors: getting in and out of shower, driving, sleeping and turning over, can't do housework or dishes or laundry because my washer and dishwasher are downstairs.  Relieving factors: nothing makes it better  PRECAUTIONS: None  WEIGHT BEARING RESTRICTIONS: No  FALLS:  Has patient fallen in last 6 months? Yes. Number of falls 1 after mothers day   LIVING ENVIRONMENT: Lives with: lives with their  spouse Lives in: House/apartment Stairs: Yes: Internal: 12 steps; on right going up and External: 3 steps; can reach both Has following equipment at home: Single point cane, Walker - 2 wheeled, and uses a stool to prop leg when cooking  OCCUPATION: retired  PLOF: Independent  not using a cane  PATIENT GOALSMake back better so I can do stuff:   NEXT MD VISIT: wellness check 05-22-23  OBJECTIVE: (objective measures completed at initial evaluation unless otherwise dated)   DIAGNOSTIC FINDINGS:  EXAM:6-12-02-22 LUMBAR SPINE - COMPLETE 4+ VIEW   COMPARISON:  None Available.   FINDINGS: Moderate levoscoliosis of lumbar spine is noted. Moderate lateral subluxation of L3 on L4 to the left is noted. No fracture or spondylolisthesis is noted. Severe degenerative disc disease is noted at L2-3 and L3-4.   IMPRESSION: Severe multilevel degenerative disc disease. No acute abnormality seen. Moderate levoscoliosis of lumbar spine is noted.   Aortic Atherosclerosis (ICD10-I70.0).  PATIENT SURVEYS:  FOTO 31%  52% predicted  SCREENING FOR RED FLAGS: Bowel or bladder incontinence: No   COGNITION: Overall cognitive status: Within functional limits for tasks assessed     SENSATION: WFL  but pain radiating (sharp) into left buttock down to knee  MUSCLE LENGTH: Hamstrings: Right 69 deg; Left 70 deg   POSTURE: rounded shoulders, forward head, and scoliosis Levo scoliosis Left SIJ posterior pelvic rotation and Side bend posture Left standing .  PALPATION: Left paraspinals with increased tension, TTP on LT buttock and LT SIJ  LUMBAR ROM:   AROM eval  Flexion Limited 75% fingertips to thigh  Extension Limited 75%  Right lateral flexion Limited 75%  Left lateral flexion Limited 75%  Right rotation Limited 50%  Left rotation Limited 50%   (Blank rows = not tested)  LOWER EXTREMITY ROM:     Active  Right eval Left eval  Hip flexion 50 deg  50 Deg p!  Hip extension    Hip  abduction    Hip adduction    Hip internal rotation 30 21 p!  Hip external rotation 35 29 p!  Knee flexion    Knee extension    Ankle dorsiflexion    Ankle plantarflexion    Ankle inversion    Ankle eversion     (Blank rows = not tested)  LOWER EXTREMITY MMT:   Not tested due to pt in 7-9/10 pain  MMT Right eval Left eval  Hip flexion    Hip extension    Hip abduction    Hip adduction    Hip internal rotation  Hip external rotation    Knee flexion    Knee extension    Ankle dorsiflexion    Ankle plantarflexion    Ankle inversion    Ankle eversion     (Blank rows = not tested)  LUMBAR SPECIAL TESTS:  Straight leg raise test: Negative and Slump test: Positive  FUNCTIONAL TESTS:  5 times sit to stand: 45.3 sec  GAIT: Distance walked: 150 Assistive device utilized: Single point cane Level of assistance: Modified independence Comments: Pt with slow cadenced and  R lean  TODAY'S TREATMENT:                                                                                                                              [facility] Adult PT Treatment:                                                DATE: 05/21/23 Therapeutic Exercise: Supine pelvic tilt 2x10 cues for form Bridges 3x5 cues for comfortable ROM  SKTC 3x30sec BIL cues for breath control Adduction iso hooklying 2x10 cues for breath control Hooklying red band clam 2x10 cues for form  Mini squat x6 with UE support, very limited ROM  Seated marches 2x10 BIL cues for form and posture  HEP education/review   PATIENT EDUCATION:  Education details: rationale for interventions, HEP  Person educated: Patient Education method: Explanation, Demonstration, Tactile cues, Verbal cues, and Handouts Education comprehension: verbalized understanding, returned demonstration, tactile cues required, and needs further education  HOME EXERCISE PROGRAM: Access Code: [email or web address] URL: [email or web address] Date:  05/21/2023 Prepared by: Fransisco Hertz  Exercises - Supine Pelvic Tilt  - 1 x daily - 7 x weekly - 3 sets - 10 reps - Supine Single Knee to Chest Stretch  - 2 x daily - 7 x weekly - 1 sets - 5 reps - 10 hold - Seated March  - 2 x daily - 7 x weekly - 1 sets - 10 reps - Supine Hip Adduction Isometric with Ball  - 2 x daily - 7 x weekly - 1 sets - 10 reps   ASSESSMENT:  CLINICAL IMPRESSION: Pt arrives w/ 8/10 pain, continues to endorse pain mostly with WB. Today focusing on introduction of lumbopelvic exercises which pt tolerates well overall with exception of mini squats at counter, which is discontinued due to increase in pain that gradually returns to baseline with seated rest. Pt denies any significant change in symptoms on departure, HEP education as above. Recommend continuing along current POC in order to address relevant deficits and improve functional tolerance. Pt departs today's session in no acute distress, all voiced questions/concerns addressed appropriately from PT perspective.     OBJECTIVE IMPAIRMENTS: decreased activity tolerance, decreased mobility, difficulty walking, decreased ROM, decreased strength, postural dysfunction, and pain.   ACTIVITY LIMITATIONS: carrying, lifting, bending,  sitting, standing, squatting, transfers, bathing, reach over head, and locomotion level  PARTICIPATION LIMITATIONS: meal prep, cleaning, laundry, driving, and shopping  PERSONAL FACTORS: Osteopenia, tobacco abuse, basal cell carcinoma face, diverticulosis, emphysema, HTN Hyperlipidemia, hysterectomy. See Med hx for full history COPD,  are also affecting patient's functional outcome.   REHAB POTENTIAL: Fair    CLINICAL DECISION MAKING: Unstable/unpredictable  EVALUATION COMPLEXITY: Moderate   GOALS: Goals reviewed with patient? No  SHORT TERM GOALS: Target date: 06-06-23  Pt will be independent with initial HEP Baseline:limited knowledge Goal status: INITIAL  2.  5 x STS improves  to at least 30 sec Baseline: 45.3 eval Goal status: INITIAL  3.  Pt will be able to stand with upright posture and decreased pain to at least 5/10 Baseline: unable to stand upright and pain at 9/10 Goal status: INITIAL  4.  Pt will be able to verbalize 3 ways to decrease pain using heat cold /modalities at home for pain control Baseline: no knowledge Goal status: INITIAL    LONG TERM GOALS: Target date: 06-27-23  Pt will be independent with advanced HEP Baseline: limited knowledge Goal status: INITIAL  2.  5 x STS imrproves to at least 20 sec to show increased LE strength and decrease risk of fall Baseline: STS 45.3.sec Goal status: INITIAL  3.  Pt will be able to bend and lift to perform household dutes with proper form with increase in pain Baseline: Unable to bend forward more than 25 % Goal status: INITIAL  4.  Pt will be able to ambulate I without AD  Baseline: reliant on SPC at eval and limited walking ability Goal status: INITIAL  5.  FOTO will improve from 31%   to 52%     indicating improved functional mobility Baseline: 31% eval Goal status: INITIAL  6.  Pt will be able to negotiate steps with pain no greater than 3/10 Baseline: avoiding steps Goal status: INITIAL  PLAN:  PT FREQUENCY: 2x/week  PT DURATION: 6 weeks  PLANNED INTERVENTIONS: Therapeutic exercises, Therapeutic activity, Neuromuscular re-education, Balance training, Gait training, Patient/Family education, Self Care, Joint mobilization, Stair training, Dry Needling, Electrical stimulation, Spinal mobilization, Cryotherapy, Moist heat, Taping, Ionotophoresis 4mg /ml Dexamethasone, Manual therapy, and Re-evaluation.  PLAN FOR NEXT SESSION: Symptom modification strategies as indicated. Work on lumbopelvic stability within pt tolerance, gradual progression to WB as tolerated.    Ashley Murrain PT, DPT 05/21/2023 4:43 PM

## 2023-05-21 ENCOUNTER — Encounter: Payer: Self-pay | Admitting: Physical Therapy

## 2023-05-21 ENCOUNTER — Ambulatory Visit: Payer: Medicare Other | Admitting: Physical Therapy

## 2023-05-21 DIAGNOSIS — M25552 Pain in left hip: Secondary | ICD-10-CM

## 2023-05-21 DIAGNOSIS — G8929 Other chronic pain: Secondary | ICD-10-CM

## 2023-05-21 DIAGNOSIS — M5442 Lumbago with sciatica, left side: Secondary | ICD-10-CM | POA: Diagnosis not present

## 2023-05-21 DIAGNOSIS — M545 Low back pain, unspecified: Secondary | ICD-10-CM | POA: Diagnosis not present

## 2023-05-21 DIAGNOSIS — M6281 Muscle weakness (generalized): Secondary | ICD-10-CM | POA: Diagnosis not present

## 2023-05-21 DIAGNOSIS — R293 Abnormal posture: Secondary | ICD-10-CM

## 2023-05-21 NOTE — Progress Notes (Unsigned)
No chief complaint on file.  Tiffany [name] is a 77 y.o. female who presents for Medicare wellness visit and follow-up on recent labs.    See below for labs done prior to today's visit.   Back pain: Originally seen 5/30 with L sided LBP after a Fall.  This had resolved, but recurred 6/10, without any known injury/trigger or new fall.  Pain was radiating down the back of her left thigh, and she had a lot of pain with walking. She had been taking meloxicam, wasn't helping.  She had x-ray performed, and was referred to PT.  She has had 2 PT sessions so far. Denies numbness, tingling, weakness, no bowel/bladder changes.   Xray: IMPRESSION: Severe multilevel degenerative disc disease. No acute abnormality seen. Moderate levoscoliosis of lumbar spine is noted.  COPD and Abnormal lung cancer screening CT.  She saw Dr. Everardo All to discuss the results, and f/u COPD. She remains symptomatic on LAMA/LABA, but prefers not to add ICS component.  They discussed CT finding--new part solid masslike lesion in the posterior segment right upper lobe, concerning for infectious/inflammatory and possibly malignant etiology.  PET has been ordered, may need bronchoscopy.  Hypertension. She is compliant with taking losartan 50mg . She denies side effects, headaches, chest pain, palpitations, edema, shortness of breath (other than from COPD).   BP's haven't been checked at home recently.  BP was high at her recent visit, related to pain. BP was better on recheck, and at pulmonary visit the following day.  BP Readings from Last 3 Encounters:  05/09/23 136/68  05/08/23 132/70  04/25/23 134/80    Hyperlipidemia and aortic atherosclerosis, with calcified atherosclerotic plaque in RCA. She is compliant with rosuvastatin 10mg  and denies side effects.  She has occasional foot cramps. She had run out of rosuvastatin a few days prior to the lipid panel as reported below.  Pre-diabetes:  Last A1c was 6.1% in 09/2022  (Insurance only allows checking once/year).  At that time she reported having dum-dum lollipops after meals.  +potatoes, spaghetti and mac and cheese occasionally, occasional biscuits.  She has the lollipops just occasionally (less often).  Recent fasting sugar was 116.   Immunization History  Administered Date(s) Administered   Fluad Quad(high Dose 65+) 09/15/2019, 08/08/2020, 09/21/2021, 08/15/2022   Influenza Split 10/16/2012   Influenza, High Dose Seasonal PF 09/02/2013, 08/25/2014, 08/31/2015, 11/01/2016, 09/09/2017, 09/09/2018   PFIZER(Purple Top)SARS-COV-2 Vaccination 07/01/2020, 07/22/2020   PNEUMOCOCCAL CONJUGATE-20 05/08/2023   Pneumococcal Conjugate-13 08/25/2014   Pneumococcal Polysaccharide-23 12/12/2009, 08/31/2015   Tdap 12/12/2009   Last Pap smear: s/p hyst in 20's   Last mammogram: 04/2021 Last colonoscopy: 11/2010 Dr. Laural Benes. She declines repeating colonoscopy (unless she has symptoms). Last DEXA: 09/2022 and was stable, with T-1.9 at L fem neck (prior was 03/2020, T-2.1 at left fem neck).  Dentist: doesn't go, has dentures   Ophtho: yearly Exercise: "not much"  Patient Care Team: Joselyn Arrow, MD as PCP - General (Family Medicine) Luciano Cutter, MD as Consulting Physician (Pulmonary Disease) Antonietta Barcelona, OD as Referring Physician (Optometry) Tora Duck, PA-C (Physician Assistant) GI:  Dr. Laural Benes [facility])--retired ophtho (specialist): Dr. Rusty Aus longer sees   Depression Screening: Flowsheet Row Office Visit from 04/18/2022 in [facility]  PHQ-2 Total Score 0         Falls screen:     04/25/2023   11:24 AM 10/22/2022   10:45 AM 04/18/2022    1:45 PM 03/28/2022    1:34 PM 03/22/2021  9:44 AM  Fall Risk   Falls in the past year? 1 0 0 0 0  Number falls in past yr: 0 0 0 0 0  Comment 04/14/23-fell outside [place]      Injury with Fall? 1 0 0 0 0  Comment hurt left hip and right shoulder      Risk for fall due  to : History of fall(s) No Fall Risks No Fall Risks No Fall Risks No Fall Risks  Follow up Falls evaluation completed Falls evaluation completed Falls evaluation completed Falls evaluation completed Falls evaluation completed     Functional Status Survey:         End of Life Discussion:  Patient does not have a living will and medical power of attorney. She was given paperwork in the past, hasn't returned it. She still has it at home.   PMH, PSH, SH and FH were reviewed and updated   ROS:  No fever, chills, URI symptoms, headaches, dizziness, chest pain, palpitations. Breathing at baseline. SOB with stairs and carrying groceries uphill, unchanged.  Chronic cough, unchanged. No nausea, vomiting, diarrhea or bowel changes.  Heartburn occasionally, with certain foods, r/b Tums. Occasional L knee pain, mainly with stairs.  Back pain       PHYSICAL EXAM:  There were no vitals taken for this visit.  Wt Readings from Last 3 Encounters:  05/09/23 151 lb 9.6 oz (68.8 kg)  05/08/23 152 lb 9.6 oz (69.2 kg)  04/25/23 151 lb 3.2 oz (68.6 kg)   Pleasant, well-appearing female, in no distress. Some throat-clearing during visit, no coughing.  Speaking easily and comfortably. She is alert and oriented. Normal gait. Normal mood, affect, hygiene, grooming, eye contact and speech. Heart: regular rate and rhythm Lungs: slightly distant, clear. No wheezes, rales or ronchi   Lab Results  Component Value Date   WBC 7.2 05/08/2023   HGB 12.4 05/08/2023   HCT 38.0 05/08/2023   MCV 93 05/08/2023   PLT 207 05/08/2023   Fasting glucose 116    Chemistry      Component Value Date/Time   NA 137 05/08/2023 0953   K 4.3 05/08/2023 0953   CL 99 05/08/2023 0953   CO2 22 05/08/2023 0953   BUN 17 05/08/2023 0953   CREATININE 0.70 05/08/2023 0953   CREATININE 0.72 12/02/2017 0945      Component Value Date/Time   CALCIUM 9.1 05/08/2023 0953   ALKPHOS 147 (H) 05/08/2023 0953   AST 19  05/08/2023 0953   ALT 13 05/08/2023 0953   BILITOT 0.5 05/08/2023 0953     Lab Results  Component Value Date   CHOL 140 05/08/2023   HDL 59 05/08/2023   LDLCALC 66 05/08/2023   TRIG 77 05/08/2023   CHOLHDL 2.4 05/08/2023      ASSESSMENT/PLAN:   REMIND TO GET TDAP (2 WEEKS FROM LAST VISIT, FROM PHARMACY)  Discussed monthly self breast exams and yearly mammogram (past due, reminded to schedule); at least 30 minutes of aerobic activity at least 5 days/week, weight-bearing exercise 2x/wk; proper sunscreen use reviewed; healthy diet, including goals of calcium and vitamin D intake and alcohol recommendations (less than or equal to 1 drink/day) reviewed; regular seatbelt use; changing batteries in smoke detectors. Immunization recommendations discussed-- continue yearly high dose flu shots. RSV vaccine recommended in the Fall.  Tdap and Shingrix again recommended, to get from pharmacy.  COVID booster recommended, declined.  Colonoscopy recommendations reviewed, declines any screening at this time, aware of  potential risks of later dx of cancer.   MOST form reviewed and signed. Full Code, Full care. Doesn't want prolonged measures.  She would want full initial resuscitation, including intubation. Discussed importance/reasons for completing Living Will and healthcare POA, encouraged to fill out and give [place] a copy after notarized.     Medicare Attestation I have personally reviewed: The patient's medical and social history Their use of alcohol, tobacco or illicit drugs Their current medications and supplements The patient's functional ability including ADLs,fall risks, home safety risks, cognitive, and hearing and visual impairment Diet and physical activities Evidence for depression or mood disorders  The patient's weight, height, BMI have been recorded in the chart.  I have made referrals, counseling, and provided education to the patient based on review of the above and I have  provided the patient with a written personalized care plan for preventive services.

## 2023-05-21 NOTE — Patient Instructions (Incomplete)
  Tiffany [name] , Thank you for taking time to come for your Medicare Wellness Visit. I appreciate your ongoing commitment to your health goals. Please review the following plan we discussed and let me know if I can assist you in the future.   This is a list of the screening recommended for you and due dates:  Health Maintenance  Topic Date Due   Zoster (Shingles) Vaccine (1 of 2) Never done   DTaP/Tdap/Td vaccine (2 - Td or Tdap) 12/13/2019   Colon Cancer Screening  11/28/2020   COVID-19 Vaccine (3 - Pfizer risk series) 05/24/2023*   Flu Shot  06/27/2023   Screening for Lung Cancer  04/28/2024   Medicare Annual Wellness Visit  05/21/2024   Pneumonia Vaccine  Completed   DEXA scan (bone density measurement)  Completed   Hepatitis C Screening  Completed   HPV Vaccine  Aged Out  *Topic was postponed. The date shown is not the original due date.   As previously discussed, please get the TdaP (tetanus booster) from the pharmacy now. We also want you to get the shingles vaccine. Since you have Medicare, you will need to get this from the pharmacy, as it is covered by Part D.  This is a series of 2 injections, spaced 2 months apart.   This should be separated from other vaccines by at least 2 weeks.  I recommend getting the RSV vaccine from the pharmacy in the Fall, along with your high dose flu shot.  Please schedule your mammogram.  Please bring [place] copies of your Living Will and Healthcare Power of Attorney once completed and notarized so that it can be scanned into your medical chart.

## 2023-05-22 ENCOUNTER — Encounter: Payer: Self-pay | Admitting: Physical Therapy

## 2023-05-22 ENCOUNTER — Ambulatory Visit: Payer: Medicare Other | Admitting: Physical Therapy

## 2023-05-22 ENCOUNTER — Ambulatory Visit (INDEPENDENT_AMBULATORY_CARE_PROVIDER_SITE_OTHER): Payer: Medicare Other | Admitting: Family Medicine

## 2023-05-22 ENCOUNTER — Encounter: Payer: Self-pay | Admitting: Family Medicine

## 2023-05-22 VITALS — BP 110/72 | HR 72 | Ht 67.0 in | Wt 149.0 lb

## 2023-05-22 DIAGNOSIS — Z0000 Encounter for general adult medical examination without abnormal findings: Secondary | ICD-10-CM

## 2023-05-22 DIAGNOSIS — R7303 Prediabetes: Secondary | ICD-10-CM

## 2023-05-22 DIAGNOSIS — E7800 Pure hypercholesterolemia, unspecified: Secondary | ICD-10-CM

## 2023-05-22 DIAGNOSIS — M6281 Muscle weakness (generalized): Secondary | ICD-10-CM | POA: Diagnosis not present

## 2023-05-22 DIAGNOSIS — R9389 Abnormal findings on diagnostic imaging of other specified body structures: Secondary | ICD-10-CM | POA: Diagnosis not present

## 2023-05-22 DIAGNOSIS — J449 Chronic obstructive pulmonary disease, unspecified: Secondary | ICD-10-CM

## 2023-05-22 DIAGNOSIS — I10 Essential (primary) hypertension: Secondary | ICD-10-CM | POA: Diagnosis not present

## 2023-05-22 DIAGNOSIS — R293 Abnormal posture: Secondary | ICD-10-CM

## 2023-05-22 DIAGNOSIS — G8929 Other chronic pain: Secondary | ICD-10-CM

## 2023-05-22 DIAGNOSIS — M25552 Pain in left hip: Secondary | ICD-10-CM | POA: Diagnosis not present

## 2023-05-22 DIAGNOSIS — M545 Low back pain, unspecified: Secondary | ICD-10-CM | POA: Diagnosis not present

## 2023-05-22 DIAGNOSIS — M5442 Lumbago with sciatica, left side: Secondary | ICD-10-CM | POA: Diagnosis not present

## 2023-05-22 NOTE — Therapy (Signed)
OUTPATIENT PHYSICAL THERAPY TREATMENT NOTE   Patient Name: Tiffany [name] MRN: [id number] DOB:09-Apr-1946, 77 y.o., female Today's Date: 05/22/2023  END OF SESSION:  PT End of Session - 05/22/23 1633     Visit Number 3    Number of Visits 12    Date for PT Re-Evaluation 06/27/23    Authorization Type [employer] medicare    Progress Note Due on Visit 10    PT Start Time 1633    PT Stop Time 1712    PT Time Calculation (min) 39 min    Activity Tolerance Patient tolerated treatment well    Behavior During Therapy [facility] for tasks assessed/performed               Past Medical History:  Diagnosis Date   Abnormal PFT 09/02/2014   moderate restrictive ventilatory defect.   BCC (basal cell carcinoma), face 2010   Dr. Terri Piedra   Colon polyp 12/2003   Diverticulosis 11/2010   seen on colonoscopy and air contrast BE   Emphysema of lung (HCC)    Hyperlipidemia    Hypertension    previously treated with meds, resolved   MGUS (monoclonal gammopathy of unknown significance) 1999   prevously under care of Dr. 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 Laural Benes. Diverticulosis. 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   Osteopenia 05/12/2012   Vitamin D deficiency 04/28/2012   Pure hypercholesterolemia 04/28/2012   Former smoker 04/03/2012    PCP: Joselyn Arrow, MD   REFERRING PROVIDER: Joselyn Arrow, MD   REFERRING DIAG: M54.50 (ICD-10-CM) - Acute left-sided low back pain without sciatica   Rationale for Evaluation and Treatment: Rehabilitation  THERAPY DIAG:  Chronic bilateral low back pain with left-sided sciatica  Muscle weakness (generalized)  Pain in left hip  Abnormal posture  ONSET DATE: 04-14-23 Mothers day  SUBJECTIVE:  SUBJECTIVE STATEMENT: 05/22/2023 7/10 pain at present, continues to report about the same amount of pain overall. Had a bit of pain/soreness after last session that returned to baseline by end of day. No other new updates.     PERTINENT HISTORY:  Osteopenia, tobacco abuse, basal cell carcinoma face, diverticulosis, emphysema, HTN Hyperlipidemia, hysterectomy. See Med hx for full history COPD,      PAIN:  Are you having pain? Yes: NPRS scale: at rest is a 7/10 and at worst 08/05/09 Pain location: Left low back pain into my buttock to my knee Pain description: sharp pain Aggravating factors: getting in and out of shower, driving, sleeping and turning over, can't do housework or dishes or laundry because my washer and dishwasher are downstairs.  Relieving factors: nothing makes it better  PRECAUTIONS: None  WEIGHT BEARING RESTRICTIONS: No  FALLS:  Has patient fallen in last 6 months? Yes. Number of falls 1 after mothers day   LIVING ENVIRONMENT: Lives with:  lives with their spouse Lives in: House/apartment Stairs: Yes: Internal: 12 steps; on right going up and External: 3 steps; can reach both Has following equipment at home: Single point cane, Walker - 2 wheeled, and uses a stool to prop leg when cooking  OCCUPATION: retired  PLOF: Independent  not using a cane  PATIENT GOALSMake back better so I can do stuff:   NEXT MD VISIT: wellness check 05-22-23  OBJECTIVE: (objective measures completed at initial evaluation unless otherwise dated)   DIAGNOSTIC FINDINGS:  EXAM:6-12-02-22 LUMBAR SPINE - COMPLETE 4+ VIEW   COMPARISON:  None Available.   FINDINGS: Moderate levoscoliosis of lumbar spine is noted. Moderate lateral subluxation of L3 on L4 to the left is noted. No fracture or spondylolisthesis is noted. Severe degenerative disc disease is noted at L2-3 and L3-4.   IMPRESSION: Severe multilevel degenerative disc disease. No acute abnormality seen. Moderate levoscoliosis of lumbar spine is noted.   Aortic Atherosclerosis (ICD10-I70.0).  PATIENT SURVEYS:  FOTO 31%  52% predicted  SCREENING FOR RED FLAGS: Bowel or bladder incontinence: No   COGNITION: Overall cognitive status: Within functional limits for tasks assessed     SENSATION: WFL  but pain radiating (sharp) into left buttock down to knee  MUSCLE LENGTH: Hamstrings: Right 69 deg; Left 70 deg   POSTURE: rounded shoulders, forward head, and scoliosis Levo scoliosis Left SIJ posterior pelvic rotation and Side bend posture Left standing .  PALPATION: Left paraspinals with increased tension, TTP on LT buttock and LT SIJ  LUMBAR ROM:   AROM eval  Flexion Limited 75% fingertips to thigh  Extension Limited 75%  Right lateral flexion Limited 75%  Left lateral flexion Limited 75%  Right rotation Limited 50%  Left rotation Limited 50%   (Blank rows = not tested)  LOWER EXTREMITY ROM:     Active  Right eval Left eval  Hip flexion 50 deg  50 Deg p!  Hip  extension    Hip abduction    Hip adduction    Hip internal rotation 30 21 p!  Hip external rotation 35 29 p!  Knee flexion    Knee extension    Ankle dorsiflexion    Ankle plantarflexion    Ankle inversion    Ankle eversion     (Blank rows = not tested)  LOWER EXTREMITY MMT:   Not tested due to pt in 7-9/10 pain  MMT Right eval Left eval  Hip flexion    Hip extension    Hip abduction  Hip adduction    Hip internal rotation    Hip external rotation    Knee flexion    Knee extension    Ankle dorsiflexion    Ankle plantarflexion    Ankle inversion    Ankle eversion     (Blank rows = not tested)  LUMBAR SPECIAL TESTS:  Straight leg raise test: Negative and Slump test: Positive Slump test 05/22/23 negative bilaterally   FUNCTIONAL TESTS:  5 times sit to stand: 45.3 sec  GAIT: Distance walked: 150 Assistive device utilized: Single point cane Level of assistance: Modified independence Comments: Pt with slow cadenced and  R lean  TODAY'S TREATMENT:                                                                                                                              [facility] Adult PT Treatment:                                                DATE: 05/22/23 Therapeutic Exercise: Adduction iso, seated, 3x12 cues for pacing Seated red band hip abduction 3x12 cues for form and pacing Seated marches w/ red band around knees 2x8 BIL cues for form and trunk mechanics  Seated swiss ball press down 2x10 cues for breath control and exertion STS from chair w/ UE support 2x5 cues for pacing, rest breaks required Seated row RTB x10 cues for posture, sitting unsupported     [facility] Adult PT Treatment:                                                DATE: 05/21/23 Therapeutic Exercise: Supine pelvic tilt 2x10 cues for form Bridges 3x5 cues for comfortable ROM  SKTC 3x30sec BIL cues for breath control Adduction iso hooklying 2x10 cues for breath control Hooklying red band clam 2x10  cues for form  Mini squat x6 with UE support, very limited ROM  Seated marches 2x10 BIL cues for form and posture  HEP education/review   PATIENT EDUCATION:  Education details: rationale for interventions, HEP  Person educated: Patient Education method: Explanation, Demonstration, Tactile cues, Verbal cues, and Handouts Education comprehension: verbalized understanding, returned demonstration, tactile cues required, and needs further education  HOME EXERCISE PROGRAM: Access Code: [email or web address] URL: [email or web address] Date: 05/21/2023 Prepared by: Fransisco Hertz  Exercises - Supine Pelvic Tilt  - 1 x daily - 7 x weekly - 3 sets - 10 reps - Supine Single Knee to Chest Stretch  - 2 x daily - 7 x weekly - 1 sets - 5 reps - 10 hold - Seated March  - 2 x daily - 7 x weekly - 1 sets - 10 reps - Supine Hip  Adduction Isometric with Ball  - 2 x daily - 7 x weekly - 1 sets - 10 reps   ASSESSMENT:  CLINICAL IMPRESSION: 05/22/2023 Pt arrives w/ 7/10 pain on NPS. Today working on progression for volume with NWB exercises and increased work with core stability which pt tolerates well overall. Given irritability w/ mini squats at counter yesterday, modified to STS transfers with UE support - continues to have some low back pain but non worsening with repetition and reportedly improved compared to yesterday. Pt tolerates exercise well overall without adverse event and no increases in resting pain. Recommend continuing along current POC in order to address relevant deficits and improve functional tolerance. Pt departs today's session in no acute distress, all voiced questions/concerns addressed appropriately from PT perspective.      OBJECTIVE IMPAIRMENTS: decreased activity tolerance, decreased mobility, difficulty walking, decreased ROM, decreased strength, postural dysfunction, and pain.   ACTIVITY LIMITATIONS: carrying, lifting, bending, sitting, standing, squatting, transfers, bathing,  reach over head, and locomotion level  PARTICIPATION LIMITATIONS: meal prep, cleaning, laundry, driving, and shopping  PERSONAL FACTORS: Osteopenia, tobacco abuse, basal cell carcinoma face, diverticulosis, emphysema, HTN Hyperlipidemia, hysterectomy. See Med hx for full history COPD,  are also affecting patient's functional outcome.   REHAB POTENTIAL: Fair    CLINICAL DECISION MAKING: Unstable/unpredictable  EVALUATION COMPLEXITY: Moderate   GOALS: Goals reviewed with patient? No  SHORT TERM GOALS: Target date: 06-06-23  Pt will be independent with initial HEP Baseline:limited knowledge Goal status: INITIAL  2.  5 x STS improves to at least 30 sec Baseline: 45.3 eval Goal status: INITIAL  3.  Pt will be able to stand with upright posture and decreased pain to at least 5/10 Baseline: unable to stand upright and pain at 9/10 Goal status: INITIAL  4.  Pt will be able to verbalize 3 ways to decrease pain using heat cold /modalities at home for pain control Baseline: no knowledge Goal status: INITIAL    LONG TERM GOALS: Target date: 06-27-23  Pt will be independent with advanced HEP Baseline: limited knowledge Goal status: INITIAL  2.  5 x STS imrproves to at least 20 sec to show increased LE strength and decrease risk of fall Baseline: STS 45.3.sec Goal status: INITIAL  3.  Pt will be able to bend and lift to perform household dutes with proper form with increase in pain Baseline: Unable to bend forward more than 25 % Goal status: INITIAL  4.  Pt will be able to ambulate I without AD  Baseline: reliant on SPC at eval and limited walking ability Goal status: INITIAL  5.  FOTO will improve from 31%   to 52%     indicating improved functional mobility Baseline: 31% eval Goal status: INITIAL  6.  Pt will be able to negotiate steps with pain no greater than 3/10 Baseline: avoiding steps Goal status: INITIAL  PLAN:  PT FREQUENCY: 2x/week  PT DURATION: 6  weeks  PLANNED INTERVENTIONS: Therapeutic exercises, Therapeutic activity, Neuromuscular re-education, Balance training, Gait training, Patient/Family education, Self Care, Joint mobilization, Stair training, Dry Needling, Electrical stimulation, Spinal mobilization, Cryotherapy, Moist heat, Taping, Ionotophoresis 4mg /ml Dexamethasone, Manual therapy, and Re-evaluation.  PLAN FOR NEXT SESSION: Symptom modification strategies as indicated. Work on lumbopelvic stability within pt tolerance, gradual progression to WB as tolerated.    Ashley Murrain PT, DPT 05/22/2023 5:18 PM

## 2023-05-23 ENCOUNTER — Encounter: Payer: Self-pay | Admitting: *Deleted

## 2023-05-28 ENCOUNTER — Ambulatory Visit: Payer: Medicare Other

## 2023-06-04 NOTE — Therapy (Signed)
OUTPATIENT PHYSICAL THERAPY TREATMENT NOTE   Patient Name: Tiffany [name] MRN: [id number] DOB:[birth year], 77 y.o., female Today's Date: 06/04/2023  END OF SESSION:      Past Medical History:  Diagnosis Date   Abnormal PFT 09/02/2014   moderate restrictive ventilatory defect.   BCC (basal cell carcinoma), face 2010   Dr. Terri Piedra   Colon polyp 12/2003   Diverticulosis 11/2010   seen on colonoscopy and air contrast BE   Emphysema of lung (HCC)    Hyperlipidemia    Hypertension    previously treated with meds, resolved   MGUS (monoclonal gammopathy of unknown significance) 1999   prevously under care of Dr. 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 Laural Benes. Diverticulosis. BE also done--normal   OMENTECTOMY  11/26/2005   and pelvic lymphadenectomy (with BSO)   SKIN CANCER EXCISION Left 07/2021   neck; pt can't recall type of cancer   TONSILLECTOMY  age 56   VAGINAL HYSTERECTOMY  late 20's   Patient Active Problem List   Diagnosis Date Noted   Nocturnal hypoxemia 08/15/2022   Chest pain on exertion 01/08/2022   Aortic atherosclerosis (HCC) 06/03/2018   Atherosclerosis of right coronary artery 06/03/2018   Essential hypertension 12/31/2016   MGUS (monoclonal gammopathy of unknown significance) 10/30/2016   Senile purpura (HCC) 08/31/2015   COPD, 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   Osteopenia 05/12/2012   Vitamin D deficiency 04/28/2012   Pure hypercholesterolemia 04/28/2012   Former smoker 04/03/2012    PCP: Joselyn Arrow, MD   REFERRING PROVIDER:  Joselyn Arrow, MD   REFERRING DIAG: M54.50 (ICD-10-CM) - Acute left-sided low back pain without sciatica   Rationale for Evaluation and Treatment: Rehabilitation  THERAPY DIAG:  No diagnosis found.  ONSET DATE: 04-14-23 Mothers day  SUBJECTIVE:                                                                                                                                                                                           SUBJECTIVE STATEMENT: 06/04/2023 ***  *** 7/10 pain at present, continues to report about the same amount of pain overall. Had a  bit of pain/soreness after last session that returned to baseline by end of day. No other new updates.     PERTINENT HISTORY:  Osteopenia, tobacco abuse, basal cell carcinoma face, diverticulosis, emphysema, HTN Hyperlipidemia, hysterectomy. See Med hx for full history COPD,      PAIN:  Are you having pain? Yes: NPRS scale: at rest is a 7/10 and at worst 08/05/09 Pain location: Left low back pain into my buttock to my knee Pain description: sharp pain Aggravating factors: getting in and out of shower, driving, sleeping and turning over, can't do housework or dishes or laundry because my washer and dishwasher are downstairs.  Relieving factors: nothing makes it better  PRECAUTIONS: None  WEIGHT BEARING RESTRICTIONS: No  FALLS:  Has patient fallen in last 6 months? Yes. Number of falls 1 after mothers day   LIVING ENVIRONMENT: Lives with: lives with their spouse Lives in: House/apartment Stairs: Yes: Internal: 12 steps; on right going up and External: 3 steps; can reach both Has following equipment at home: Single point cane, Walker - 2 wheeled, and uses a stool to prop leg when cooking  OCCUPATION: retired  PLOF: Independent  not using a cane  PATIENT GOALSMake back better so I can do stuff:   NEXT MD VISIT: wellness check 05-22-23  OBJECTIVE: (objective measures completed at initial evaluation unless otherwise dated)    DIAGNOSTIC FINDINGS:  EXAM:6-12-02-22 LUMBAR SPINE - COMPLETE 4+ VIEW   COMPARISON:  None Available.   FINDINGS: Moderate levoscoliosis of lumbar spine is noted. Moderate lateral subluxation of L3 on L4 to the left is noted. No fracture or spondylolisthesis is noted. Severe degenerative disc disease is noted at L2-3 and L3-4.   IMPRESSION: Severe multilevel degenerative disc disease. No acute abnormality seen. Moderate levoscoliosis of lumbar spine is noted.   Aortic Atherosclerosis (ICD10-I70.0).  PATIENT SURVEYS:  FOTO 31%  52% predicted  SCREENING FOR RED FLAGS: Bowel or bladder incontinence: No   COGNITION: Overall cognitive status: Within functional limits for tasks assessed     SENSATION: WFL  but pain radiating (sharp) into left buttock down to knee  MUSCLE LENGTH: Hamstrings: Right 69 deg; Left 70 deg   POSTURE: rounded shoulders, forward head, and scoliosis Levo scoliosis Left SIJ posterior pelvic rotation and Side bend posture Left standing .  PALPATION: Left paraspinals with increased tension, TTP on LT buttock and LT SIJ  LUMBAR ROM:   AROM eval  Flexion Limited 75% fingertips to thigh  Extension Limited 75%  Right lateral flexion Limited 75%  Left lateral flexion Limited 75%  Right rotation Limited 50%  Left rotation Limited 50%   (Blank rows = not tested)  LOWER EXTREMITY ROM:     Active  Right eval Left eval  Hip flexion 50 deg  50 Deg p!  Hip extension    Hip abduction    Hip adduction    Hip internal rotation 30 21 p!  Hip external rotation 35 29 p!  Knee flexion    Knee extension    Ankle dorsiflexion    Ankle plantarflexion    Ankle inversion    Ankle eversion     (Blank rows = not tested)  LOWER EXTREMITY MMT:   Not tested due to pt in 7-9/10 pain  MMT Right eval Left eval  Hip flexion    Hip extension    Hip abduction    Hip adduction    Hip internal rotation    Hip external rotation    Knee flexion  Knee  extension    Ankle dorsiflexion    Ankle plantarflexion    Ankle inversion    Ankle eversion     (Blank rows = not tested)  LUMBAR SPECIAL TESTS:  Straight leg raise test: Negative and Slump test: Positive Slump test 05/22/23 negative bilaterally   FUNCTIONAL TESTS:  5 times sit to stand: 45.3 sec  GAIT: Distance walked: 150 Assistive device utilized: Single point cane Level of assistance: Modified independence Comments: Pt with slow cadenced and  R lean  TODAY'S TREATMENT:                                                                                                                              [facility] Adult PT Treatment:                                                DATE: 06/05/23 Therapeutic Exercise: *** Manual Therapy: *** Neuromuscular re-ed: *** Therapeutic Activity: *** Modalities: *** Self Care: [facility] Adult PT Treatment:                                                DATE: 05/22/23 Therapeutic Exercise: Adduction iso, seated, 3x12 cues for pacing Seated red band hip abduction 3x12 cues for form and pacing Seated marches w/ red band around knees 2x8 BIL cues for form and trunk mechanics  Seated swiss ball press down 2x10 cues for breath control and exertion STS from chair w/ UE support 2x5 cues for pacing, rest breaks required Seated row RTB x10 cues for posture, sitting unsupported     [facility] Adult PT Treatment:                                                DATE: 05/21/23 Therapeutic Exercise: Supine pelvic tilt 2x10 cues for form Bridges 3x5 cues for comfortable ROM  SKTC 3x30sec BIL cues for breath control Adduction iso hooklying 2x10 cues for breath control Hooklying red band clam 2x10 cues for form  Mini squat x6 with UE support, very limited ROM  Seated marches 2x10 BIL cues for form and posture  HEP education/review   PATIENT EDUCATION:  Education details: rationale for interventions, HEP  Person educated: Patient Education method:  Explanation, Demonstration, Tactile cues, Verbal cues, and Handouts Education comprehension: verbalized understanding, returned demonstration, tactile cues required, and needs further education  HOME EXERCISE PROGRAM: Access Code: [email or web address] URL: [email or web address] Date: 05/21/2023 Prepared by: Fransisco Hertz  Exercises - Supine Pelvic Tilt  - 1 x daily - 7 x weekly -  3 sets - 10 reps - Supine Single Knee to Chest Stretch  - 2 x daily - 7 x weekly - 1 sets - 5 reps - 10 hold - Seated March  - 2 x daily - 7 x weekly - 1 sets - 10 reps - Supine Hip Adduction Isometric with Ball  - 2 x daily - 7 x weekly - 1 sets - 10 reps   ASSESSMENT:  CLINICAL IMPRESSION: 06/04/2023 ***  *** Pt arrives w/ 7/10 pain on NPS. Today working on progression for volume with NWB exercises and increased work with core stability which pt tolerates well overall. Given irritability w/ mini squats at counter yesterday, modified to STS transfers with UE support - continues to have some low back pain but non worsening with repetition and reportedly improved compared to yesterday. Pt tolerates exercise well overall without adverse event and no increases in resting pain. Recommend continuing along current POC in order to address relevant deficits and improve functional tolerance. Pt departs today's session in no acute distress, all voiced questions/concerns addressed appropriately from PT perspective.      OBJECTIVE IMPAIRMENTS: decreased activity tolerance, decreased mobility, difficulty walking, decreased ROM, decreased strength, postural dysfunction, and pain.   ACTIVITY LIMITATIONS: carrying, lifting, bending, sitting, standing, squatting, transfers, bathing, reach over head, and locomotion level  PARTICIPATION LIMITATIONS: meal prep, cleaning, laundry, driving, and shopping  PERSONAL FACTORS: Osteopenia, tobacco abuse, basal cell carcinoma face, diverticulosis, emphysema, HTN Hyperlipidemia,  hysterectomy. See Med hx for full history COPD,  are also affecting patient's functional outcome.   REHAB POTENTIAL: Fair    CLINICAL DECISION MAKING: Unstable/unpredictable  EVALUATION COMPLEXITY: Moderate   GOALS: Goals reviewed with patient? No  SHORT TERM GOALS: Target date: 06-06-23  Pt will be independent with initial HEP Baseline:limited knowledge Goal status: INITIAL  2.  5 x STS improves to at least 30 sec Baseline: 45.3 eval Goal status: INITIAL  3.  Pt will be able to stand with upright posture and decreased pain to at least 5/10 Baseline: unable to stand upright and pain at 9/10 Goal status: INITIAL  4.  Pt will be able to verbalize 3 ways to decrease pain using heat cold /modalities at home for pain control Baseline: no knowledge Goal status: INITIAL    LONG TERM GOALS: Target date: 06-27-23  Pt will be independent with advanced HEP Baseline: limited knowledge Goal status: INITIAL  2.  5 x STS imrproves to at least 20 sec to show increased LE strength and decrease risk of fall Baseline: STS 45.3.sec Goal status: INITIAL  3.  Pt will be able to bend and lift to perform household dutes with proper form with increase in pain Baseline: Unable to bend forward more than 25 % Goal status: INITIAL  4.  Pt will be able to ambulate I without AD  Baseline: reliant on SPC at eval and limited walking ability Goal status: INITIAL  5.  FOTO will improve from 31%   to 52%     indicating improved functional mobility Baseline: 31% eval Goal status: INITIAL  6.  Pt will be able to negotiate steps with pain no greater than 3/10 Baseline: avoiding steps Goal status: INITIAL  PLAN:  PT FREQUENCY: 2x/week  PT DURATION: 6 weeks  PLANNED INTERVENTIONS: Therapeutic exercises, Therapeutic activity, Neuromuscular re-education, Balance training, Gait training, Patient/Family education, Self Care, Joint mobilization, Stair training, Dry Needling, Electrical stimulation,  Spinal mobilization, Cryotherapy, Moist heat, Taping, Ionotophoresis 4mg /ml Dexamethasone, Manual therapy, and Re-evaluation.  PLAN FOR  NEXT SESSION: Symptom modification strategies as indicated. Work on lumbopelvic stability within pt tolerance, gradual progression to WB as tolerated.    Ashley Murrain PT, DPT 06/04/2023 4:56 PM

## 2023-06-05 ENCOUNTER — Ambulatory Visit: Payer: Medicare Other | Attending: Family Medicine | Admitting: Physical Therapy

## 2023-06-05 ENCOUNTER — Encounter: Payer: Self-pay | Admitting: Physical Therapy

## 2023-06-05 DIAGNOSIS — G8929 Other chronic pain: Secondary | ICD-10-CM | POA: Insufficient documentation

## 2023-06-05 DIAGNOSIS — M6281 Muscle weakness (generalized): Secondary | ICD-10-CM | POA: Insufficient documentation

## 2023-06-05 DIAGNOSIS — M25552 Pain in left hip: Secondary | ICD-10-CM | POA: Insufficient documentation

## 2023-06-05 DIAGNOSIS — R293 Abnormal posture: Secondary | ICD-10-CM | POA: Diagnosis not present

## 2023-06-05 DIAGNOSIS — M5442 Lumbago with sciatica, left side: Secondary | ICD-10-CM | POA: Insufficient documentation

## 2023-06-06 ENCOUNTER — Ambulatory Visit (HOSPITAL_BASED_OUTPATIENT_CLINIC_OR_DEPARTMENT_OTHER): Payer: Medicare Other | Admitting: Pulmonary Disease

## 2023-06-06 ENCOUNTER — Encounter (HOSPITAL_BASED_OUTPATIENT_CLINIC_OR_DEPARTMENT_OTHER): Payer: Self-pay

## 2023-06-06 ENCOUNTER — Encounter (HOSPITAL_BASED_OUTPATIENT_CLINIC_OR_DEPARTMENT_OTHER): Payer: Self-pay | Admitting: Pulmonary Disease

## 2023-06-06 VITALS — BP 148/60 | HR 79 | Temp 98.8°F | Ht 67.0 in | Wt 148.0 lb

## 2023-06-06 DIAGNOSIS — Z91199 Patient's noncompliance with other medical treatment and regimen due to unspecified reason: Secondary | ICD-10-CM

## 2023-06-07 ENCOUNTER — Encounter: Payer: Self-pay | Admitting: Physical Therapy

## 2023-06-07 ENCOUNTER — Ambulatory Visit: Payer: Medicare Other | Admitting: Physical Therapy

## 2023-06-07 DIAGNOSIS — M25552 Pain in left hip: Secondary | ICD-10-CM | POA: Diagnosis not present

## 2023-06-07 DIAGNOSIS — M6281 Muscle weakness (generalized): Secondary | ICD-10-CM | POA: Diagnosis not present

## 2023-06-07 DIAGNOSIS — G8929 Other chronic pain: Secondary | ICD-10-CM | POA: Diagnosis not present

## 2023-06-07 DIAGNOSIS — M5442 Lumbago with sciatica, left side: Secondary | ICD-10-CM | POA: Diagnosis not present

## 2023-06-07 DIAGNOSIS — R293 Abnormal posture: Secondary | ICD-10-CM | POA: Diagnosis not present

## 2023-06-07 NOTE — Therapy (Signed)
OUTPATIENT PHYSICAL THERAPY TREATMENT NOTE   Patient Name: Tiffany [name] MRN: [id number] DOB:Jan 10, 1946, 77 y.o., female Today's Date: 06/07/2023  END OF SESSION:  PT End of Session - 06/07/23 1015     Visit Number 5    Number of Visits 12    Date for PT Re-Evaluation 06/27/23    Authorization Type [employer] medicare    PT Start Time 1020    PT Stop Time 1058    PT Time Calculation (min) 38 min                Past Medical History:  Diagnosis Date   Abnormal PFT 09/02/2014   moderate restrictive ventilatory defect.   BCC (basal cell carcinoma), face 2010   Dr. Terri Piedra   Colon polyp 12/2003   Diverticulosis 11/2010   seen on colonoscopy and air contrast BE   Emphysema of lung (HCC)    Hyperlipidemia    Hypertension    previously treated with meds, resolved   MGUS (monoclonal gammopathy of unknown significance) 1999   prevously under care of Dr. 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 Laural Benes. Diverticulosis. 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 No pain at rest.    PERTINENT HISTORY:  Osteopenia, tobacco abuse, basal cell carcinoma face, diverticulosis, emphysema, HTN Hyperlipidemia, hysterectomy. See Med hx for full history COPD,      PAIN:  Are you having pain? Yes: NPRS scale: at rest is a 0/10 and with walking it is 7/10 Pain location: Left low back pain into my buttock to my knee Pain description: not as sharp of a pain Aggravating factors: getting in and out of shower, driving, sleeping and turning over, can't do housework or dishes or laundry because my washer and dishwasher are downstairs.  Relieving factors: nothing makes it better  PRECAUTIONS: None  WEIGHT BEARING RESTRICTIONS: No  FALLS:  Has patient fallen in last 6 months? Yes. Number of falls 1 after mothers day   LIVING ENVIRONMENT: Lives with: lives with their spouse Lives in: House/apartment Stairs: Yes: Internal: 12 steps; on right going up and External: 3 steps; can reach both Has following equipment at home: Single point cane, Walker - 2 wheeled, and uses a stool to prop leg when cooking  OCCUPATION:  retired  PLOF: Independent  not using a cane  PATIENT GOALSMake back better so I can do stuff:   NEXT MD VISIT: wellness check 05-22-23  OBJECTIVE: (objective measures completed at initial evaluation unless otherwise dated)   DIAGNOSTIC FINDINGS:  EXAM:6-12-02-22 LUMBAR SPINE - COMPLETE 4+ VIEW   COMPARISON:  None Available.   FINDINGS: Moderate levoscoliosis of lumbar spine is noted. Moderate lateral subluxation of L3 on L4 to the left is noted. No fracture or spondylolisthesis is noted. Severe degenerative disc disease is noted at L2-3 and L3-4.   IMPRESSION: Severe multilevel degenerative disc disease. No acute abnormality seen. Moderate levoscoliosis of lumbar spine is noted.   Aortic Atherosclerosis (ICD10-I70.0).  PATIENT SURVEYS:  FOTO 31%  52% predicted  SCREENING FOR RED FLAGS: Bowel or bladder incontinence: No   COGNITION: Overall cognitive status: Within functional limits for tasks assessed     SENSATION: WFL  but pain radiating (sharp) into left buttock down to knee  MUSCLE LENGTH: Hamstrings: Right 69 deg; Left 70 deg   POSTURE: rounded shoulders, forward head, and scoliosis Levo scoliosis Left SIJ posterior pelvic rotation and Side bend posture Left standing .  PALPATION: Left paraspinals with increased tension, TTP on LT buttock and LT SIJ  LUMBAR ROM:   AROM eval  Flexion Limited 75% fingertips to thigh  Extension Limited 75%  Right lateral flexion Limited 75%  Left lateral flexion Limited 75%  Right rotation Limited 50%  Left rotation Limited 50%   (Blank rows = not tested)  LOWER EXTREMITY ROM:     Active  Right eval Left eval Right 06/07/23 Left 06/07/23  Hip flexion 50 deg  50 Deg p! 115 115  Hip extension      Hip abduction      Hip adduction      Hip internal rotation 30 21 p!    Hip external rotation 35 29 p!    Knee flexion      Knee extension      Ankle dorsiflexion      Ankle plantarflexion      Ankle inversion       Ankle eversion       (Blank rows = not tested)  LOWER EXTREMITY MMT:   Not tested due to pt in 7-9/10 pain  MMT Right eval Left eval  Hip flexion  Hip extension    Hip abduction    Hip adduction    Hip internal rotation    Hip external rotation    Knee flexion    Knee extension    Ankle dorsiflexion    Ankle plantarflexion    Ankle inversion    Ankle eversion     (Blank rows = not tested)  LUMBAR SPECIAL TESTS:  Straight leg raise test: Negative and Slump test: Positive Slump test 05/22/23 negative bilaterally   FUNCTIONAL TESTS:  5 times sit to stand: 45.3 sec 06/07/23: 22.6 sec hands on thighs   GAIT: Distance walked: 150 Assistive device utilized: Single point cane Level of assistance: Modified independence Comments: Pt with slow cadenced and  R lean  TODAY'S TREATMENT:                                                                                                                              [facility] Adult PT Treatment:                                                DATE: 06/07/23 Therapeutic Exercise: Hooklying ball squeeze with ab draw in 5 sec x 10 Hooklying clam green band  x 10  Hooklying march green band x 10 each Hook lying Ab isometric using exercise ball 3 sec 2 x 10  Bridge 5 sec x 5 - h/s cramp Bridge with feet over ball 5 sec x 8 SKTC  LTR  Figure 4 push and pull  Standing row 10 x 2 green  Standing hip abduction x 8 each     [facility] Adult PT Treatment:                                                DATE: 06/05/23 Therapeutic Exercise: Seated hip IR red band and ball as fulcrum 3x10 cues for setup Seated hip ER red band and ball as fulcrum 3x10 cues for setup Seated marches red band 2x10 cues for posture and trunk positioning STS 2x8 from chair, gentle UE support cues for pacing  Standing red band row 2x10 cues for posture and core involvement Seated triple ext LLE into ball x12 cues for form and setup Seated swiss ball press down x15 cues for  breath control and setup    [facility] Adult PT Treatment:                                                DATE: 05/22/23 Therapeutic Exercise: Adduction iso, seated, 3x12 cues for pacing Seated red band hip  abduction 3x12 cues for form and pacing Seated marches w/ red band around knees 2x8 BIL cues for form and trunk mechanics  Seated swiss ball press down 2x10 cues for breath control and exertion STS from chair w/ UE support 2x5 cues for pacing, rest breaks required Seated row RTB x10 cues for posture, sitting unsupported     [facility] Adult PT Treatment:                                                DATE: 05/21/23 Therapeutic Exercise: Supine pelvic tilt 2x10 cues for form Bridges 3x5 cues for comfortable ROM  SKTC 3x30sec BIL cues for breath control Adduction iso hooklying 2x10 cues for breath control Hooklying red band clam 2x10 cues for form  Mini squat x6 with UE support, very limited ROM  Seated marches 2x10 BIL cues for form and posture  HEP education/review   PATIENT EDUCATION:  Education details: rationale for interventions, HEP  Person educated: Patient Education method: Explanation, Demonstration, Tactile cues, Verbal cues, and Handouts Education comprehension: verbalized understanding, returned demonstration, tactile cues required, and needs further education  HOME EXERCISE PROGRAM: Access Code: [email or web address] URL: [email or web address] Date: 06/05/2023 Prepared by: Fransisco Hertz  Exercises - Supine Single Knee to Chest Stretch  - 2 x daily - 7 x weekly - 1 sets - 5 reps - 10 hold - Seated March  - 2 x daily - 7 x weekly - 1 sets - 10 reps - Supine Hip Adduction Isometric with Ball  - 2 x daily - 7 x weekly - 1 sets - 10 reps - Seated Hip Abduction with Resistance  - 2 x daily - 7 x weekly - 1 sets - 10 reps   ASSESSMENT:  CLINICAL IMPRESSION: 06/07/2023 Pt arrives w/ 0/10 pain at rest, reports gradual improvement in standing tolerance due to less intense  pain. Continuing to work on open chain hip/core work with increased volume in closed chain exercises. She reports increased pain with standing hip abduction, esp when weightbearing on left. Her 5 x STS and hip flexion AROM have improved. STG# 2 met.     OBJECTIVE IMPAIRMENTS: decreased activity tolerance, decreased mobility, difficulty walking, decreased ROM, decreased strength, postural dysfunction, and pain.   ACTIVITY LIMITATIONS: carrying, lifting, bending, sitting, standing, squatting, transfers, bathing, reach over head, and locomotion level  PARTICIPATION LIMITATIONS: meal prep, cleaning, laundry, driving, and shopping  PERSONAL FACTORS: Osteopenia, tobacco abuse, basal cell carcinoma face, diverticulosis, emphysema, HTN Hyperlipidemia, hysterectomy. See Med hx for full history COPD,  are also affecting patient's functional outcome.   REHAB POTENTIAL: Fair    CLINICAL DECISION MAKING: Unstable/unpredictable  EVALUATION COMPLEXITY: Moderate   GOALS: Goals reviewed with patient? No  SHORT TERM GOALS: Target date: 06-06-23 Pt will be independent with initial HEP Baseline:limited knowledge 06/05/23: good HEP adherence reported Goal status: ONGOING  2.  5 x STS improves to at least 30 sec Baseline: 45.3 eval 06/07/23: 22.6 sec Goal status: MET  3.  Pt will be able to stand with upright posture and decreased pain to at least 5/10 Baseline: unable to stand upright and pain at 9/10 06/05/23: 6/10 today Goal status: ONGOING  4.  Pt will be able to verbalize 3 ways to decrease pain using heat cold /modalities at home for pain control Baseline: no knowledge Goal status: INITIAL    LONG TERM GOALS:  Target date: 06-27-23  Pt will be independent with advanced HEP Baseline: limited knowledge Goal status: INITIAL  2.  5 x STS imrproves to at least 20 sec to show increased LE strength and decrease risk of fall Baseline: STS 45.3.sec Goal status: INITIAL  3.  Pt will be able to  bend and lift to perform household dutes with proper form with increase in pain Baseline: Unable to bend forward more than 25 % Goal status: INITIAL  4.  Pt will be able to ambulate I without AD  Baseline: reliant on SPC at eval and limited walking ability Goal status: INITIAL  5.  FOTO will improve from 31%   to 52%     indicating improved functional mobility Baseline: 31% eval Goal status: INITIAL  6.  Pt will be able to negotiate steps with pain no greater than 3/10 Baseline: avoiding steps Goal status: INITIAL  PLAN:  PT FREQUENCY: 2x/week  PT DURATION: 6 weeks  PLANNED INTERVENTIONS: Therapeutic exercises, Therapeutic activity, Neuromuscular re-education, Balance training, Gait training, Patient/Family education, Self Care, Joint mobilization, Stair training, Dry Needling, Electrical stimulation, Spinal mobilization, Cryotherapy, Moist heat, Taping, Ionotophoresis 4mg /ml Dexamethasone, Manual therapy, and Re-evaluation.  PLAN FOR NEXT SESSION: Symptom modification strategies as indicated. Work on lumbopelvic stability within pt tolerance, gradual progression to WB as tolerated.    Jannette Spanner, PTA 06/07/23 11:03 AM Phone: [phone] Fax: [phone]

## 2023-06-10 ENCOUNTER — Ambulatory Visit (HOSPITAL_COMMUNITY)
Admission: RE | Admit: 2023-06-10 | Discharge: 2023-06-10 | Disposition: A | Discharge status: Home / Self Care | Payer: Medicare Other | Source: Ambulatory Visit | Attending: Pulmonary Disease | Admitting: Pulmonary Disease

## 2023-06-10 DIAGNOSIS — R911 Solitary pulmonary nodule: Secondary | ICD-10-CM | POA: Diagnosis not present

## 2023-06-10 DIAGNOSIS — R918 Other nonspecific abnormal finding of lung field: Secondary | ICD-10-CM | POA: Diagnosis not present

## 2023-06-10 LAB — GLUCOSE, CAPILLARY: Glucose-Capillary: 101 mg/dL — ABNORMAL HIGH (ref 70–99)

## 2023-06-10 MED ORDER — FLUDEOXYGLUCOSE F - 18 (FDG) INJECTION
7.4500 | Freq: Once | INTRAVENOUS | Status: AC
  Administered 2023-06-10: 7.45 via INTRAVENOUS

## 2023-06-10 NOTE — Therapy (Signed)
OUTPATIENT PHYSICAL THERAPY TREATMENT NOTE   Patient Name: Tiffany [name] MRN: [id number] DOB:[birth year], 77 y.o., female Today's Date: 06/11/2023  END OF SESSION:  PT End of Session - 06/11/23 1316     Visit Number 6    Number of Visits 12    Date for PT Re-Evaluation 06/27/23    Authorization Type [employer] medicare    Progress Note Due on Visit 10    PT Start Time 1317    PT Stop Time 1400    PT Time Calculation (min) 43 min    Activity Tolerance Patient tolerated treatment well;No increased pain    Behavior During Therapy [facility] for tasks assessed/performed             Past Medical History:  Diagnosis Date   Abnormal PFT 09/02/2014   moderate restrictive ventilatory defect.   BCC (basal cell carcinoma), face 2010   Dr. Terri Piedra   Colon polyp 12/2003   Diverticulosis 11/2010   seen on colonoscopy and air contrast BE   Emphysema of lung (HCC)    Hyperlipidemia    Hypertension    previously treated with meds, resolved   MGUS (monoclonal gammopathy of unknown significance) 1999   prevously under care of Dr. 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 Laural Benes. Diverticulosis. 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   Osteopenia 05/12/2012   Vitamin D deficiency 04/28/2012   Pure hypercholesterolemia 04/28/2012   Former smoker 04/03/2012    PCP: Joselyn Arrow, MD   REFERRING PROVIDER: Joselyn Arrow, MD   REFERRING DIAG: M54.50 (ICD-10-CM) - Acute left-sided low back pain without sciatica   Rationale for Evaluation and Treatment: Rehabilitation  THERAPY DIAG:  Chronic bilateral low back pain with left-sided sciatica  Muscle weakness (generalized)  Pain in left hip  Abnormal posture  ONSET DATE: 04-14-23 Mothers day  SUBJECTIVE:  SUBJECTIVE STATEMENT: 06/11/2023 Pt states she continues to improve, did have a couple hours of extra pain after last session but eased quickly. 6/10 pain at present. Did better with showering earlier today, still feels she is slowly but steadily improving.    PERTINENT HISTORY:  Osteopenia, tobacco abuse, basal cell carcinoma face, diverticulosis, emphysema, HTN Hyperlipidemia, hysterectomy. See Med hx for full history COPD,    PAIN:  Are you having pain? Yes: NPRS scale: at rest is a 0/10 and with walking it is 7/10 Pain location: Left low back pain into my buttock to my knee Pain description: not as sharp of a pain Aggravating factors: getting in and out of shower, driving, sleeping and turning over, can't do housework or dishes or laundry because my washer and dishwasher are downstairs.  Relieving factors: nothing makes it better  PRECAUTIONS: None  WEIGHT BEARING RESTRICTIONS: No  FALLS:  Has patient fallen in last 6 months? Yes. Number of falls  1 after mothers day   LIVING ENVIRONMENT: Lives with: lives with their spouse Lives in: House/apartment Stairs: Yes: Internal: 12 steps; on right going up and External: 3 steps; can reach both Has following equipment at home: Single point cane, Walker - 2 wheeled, and uses a stool to prop leg when cooking  OCCUPATION: retired  PLOF: Independent  not using a cane  PATIENT GOALSMake back better so I can do stuff:   NEXT MD VISIT: wellness check 05-22-23  OBJECTIVE: (objective measures completed at initial evaluation unless otherwise dated)   DIAGNOSTIC FINDINGS:  EXAM:6-12-02-22 LUMBAR SPINE - COMPLETE 4+ VIEW   COMPARISON:  None Available.   FINDINGS: Moderate levoscoliosis of lumbar spine is noted. Moderate lateral subluxation of L3 on L4 to the left is noted. No fracture or spondylolisthesis is noted. Severe degenerative disc disease is noted at L2-3 and L3-4.   IMPRESSION: Severe multilevel degenerative disc disease. No acute abnormality seen. Moderate levoscoliosis of lumbar spine is noted.   Aortic Atherosclerosis (ICD10-I70.0).  PATIENT SURVEYS:  FOTO 31%  52% predicted FOTO 06/11/23: 55 current   SCREENING FOR RED FLAGS: Bowel or bladder incontinence: No   COGNITION: Overall cognitive status: Within functional limits for tasks assessed     SENSATION: WFL  but pain radiating (sharp) into left buttock down to knee  MUSCLE LENGTH: Hamstrings: Right 69 deg; Left 70 deg   POSTURE: rounded shoulders, forward head, and scoliosis Levo scoliosis Left SIJ posterior pelvic rotation and Side bend posture Left standing .  PALPATION: Left paraspinals with increased tension, TTP on LT buttock and LT SIJ  LUMBAR ROM:   AROM eval 06/11/23  Flexion Limited 75% fingertips to thigh   Extension Limited 75%   Right lateral flexion Limited 75%   Left lateral flexion Limited 75%   Right rotation Limited 50% 75%   Left rotation Limited 50% 75% mild pain   (Blank rows = not  tested)  LOWER EXTREMITY ROM:     Active  Right eval Left eval Right 06/07/23 Left 06/07/23  Hip flexion 50 deg  50 Deg p! 115 115  Hip extension      Hip abduction      Hip adduction      Hip internal rotation 30 21 p!    Hip external rotation 35 29 p!    Knee flexion      Knee extension      Ankle dorsiflexion      Ankle plantarflexion      Ankle inversion  Ankle eversion       (Blank rows = not tested)  LOWER EXTREMITY MMT:   Not tested due to pt in 7-9/10 pain  MMT Right eval Left eval  Hip flexion    Hip extension    Hip abduction    Hip adduction    Hip internal rotation    Hip external rotation    Knee flexion    Knee extension    Ankle dorsiflexion    Ankle plantarflexion    Ankle inversion    Ankle eversion     (Blank rows = not tested)  LUMBAR SPECIAL TESTS:  Straight leg raise test: Negative and Slump test: Positive Slump test 05/22/23 negative bilaterally   FUNCTIONAL TESTS:  5 times sit to stand: 45.3 sec 06/07/23: 22.6 sec hands on thighs   GAIT: Distance walked: 150 Assistive device utilized: Single point cane Level of assistance: Modified independence Comments: Pt with slow cadenced and  R lean  TODAY'S TREATMENT:                                                                                                                              [facility] Adult PT Treatment:                                                DATE: 06/11/23 Therapeutic Exercise: Bridge 3x10 cues for comfortable ROM  Hooklying swiss ball press down 2x12 cues for breath control Seated adduction iso x25 Heel raises, standing at counter 2x8  Seated swiss ball flexion x13 cues for comfortable ROM HEP update + education  Therapeutic Activity: FOTO + education STS x5 from lowest mat no UE support cues for positioning/mechanics  Mini squat 2x5 cues for BOS and trunk mechanics    [facility] Adult PT Treatment:                                                DATE:  06/07/23 Therapeutic Exercise: Hooklying ball squeeze with ab draw in 5 sec x 10 Hooklying clam green band  x 10  Hooklying march green band x 10 each Hook lying Ab isometric using exercise ball 3 sec 2 x 10  Bridge 5 sec x 5 - h/s cramp Bridge with feet over ball 5 sec x 8 SKTC  LTR  Figure 4 push and pull  Standing row 10 x 2 green  Standing hip abduction x 8 each     [facility] Adult PT Treatment:  DATE: 06/05/23 Therapeutic Exercise: Seated hip IR red band and ball as fulcrum 3x10 cues for setup Seated hip ER red band and ball as fulcrum 3x10 cues for setup Seated marches red band 2x10 cues for posture and trunk positioning STS 2x8 from chair, gentle UE support cues for pacing  Standing red band row 2x10 cues for posture and core involvement Seated triple ext LLE into ball x12 cues for form and setup Seated swiss ball press down x15 cues for breath control and setup    [facility] Adult PT Treatment:                                                DATE: 05/22/23 Therapeutic Exercise: Adduction iso, seated, 3x12 cues for pacing Seated red band hip abduction 3x12 cues for form and pacing Seated marches w/ red band around knees 2x8 BIL cues for form and trunk mechanics  Seated swiss ball press down 2x10 cues for breath control and exertion STS from chair w/ UE support 2x5 cues for pacing, rest breaks required Seated row RTB x10 cues for posture, sitting unsupported     [facility] Adult PT Treatment:                                                DATE: 05/21/23 Therapeutic Exercise: Supine pelvic tilt 2x10 cues for form Bridges 3x5 cues for comfortable ROM  SKTC 3x30sec BIL cues for breath control Adduction iso hooklying 2x10 cues for breath control Hooklying red band clam 2x10 cues for form  Mini squat x6 with UE support, very limited ROM  Seated marches 2x10 BIL cues for form and posture  HEP education/review   PATIENT EDUCATION:  Education  details: rationale for interventions, HEP  Person educated: Patient Education method: Explanation, Demonstration, Tactile cues, Verbal cues, and Handouts Education comprehension: verbalized understanding, returned demonstration, tactile cues required, and needs further education  HOME EXERCISE PROGRAM: Access Code: [email or web address] URL: [email or web address] Date: 06/11/2023 Prepared by: Fransisco Hertz  Exercises - Seated March  - 2 x daily - 7 x weekly - 1 sets - 10 reps - Supine Hip Adduction Isometric with Ball  - 2 x daily - 7 x weekly - 1 sets - 10 reps - Seated Hip Abduction with Resistance  - 2 x daily - 7 x weekly - 1 sets - 10 reps - Seated Flexion Stretch  - 2 x daily - 7 x weekly - 1 sets - 10 reps   ASSESSMENT:  CLINICAL IMPRESSION: 06/11/2023 Pt arrives w 6/10 pain, continues to endorse steady/slow improvements in tolerance. FOTO score at 55 which is improved compared to 31 at start of care. Pt continues to demo improved tolerance compared to prior sessions with progression for increased time spent in WB. No adverse events, HEP update as above. Recommend continuing along current POC in order to address relevant deficits and improve functional tolerance. Pt departs today's session in no acute distress, all voiced questions/concerns addressed appropriately from PT perspective.     OBJECTIVE IMPAIRMENTS: decreased activity tolerance, decreased mobility, difficulty walking, decreased ROM, decreased strength, postural dysfunction, and pain.   ACTIVITY LIMITATIONS: carrying, lifting, bending, sitting, standing, squatting, transfers, bathing, reach over head, and locomotion level  PARTICIPATION LIMITATIONS: meal  prep, cleaning, laundry, driving, and shopping  PERSONAL FACTORS: Osteopenia, tobacco abuse, basal cell carcinoma face, diverticulosis, emphysema, HTN Hyperlipidemia, hysterectomy. See Med hx for full history COPD,  are also affecting patient's functional outcome.    REHAB POTENTIAL: Fair    CLINICAL DECISION MAKING: Unstable/unpredictable  EVALUATION COMPLEXITY: Moderate   GOALS: Goals reviewed with patient? No  SHORT TERM GOALS: Target date: 06-06-23 Pt will be independent with initial HEP Baseline:limited knowledge 06/05/23: good HEP adherence reported Goal status: ONGOING  2.  5 x STS improves to at least 30 sec Baseline: 45.3 eval 06/07/23: 22.6 sec Goal status: MET  3.  Pt will be able to stand with upright posture and decreased pain to at least 5/10 Baseline: unable to stand upright and pain at 9/10 06/05/23: 6/10 today Goal status: ONGOING  4.  Pt will be able to verbalize 3 ways to decrease pain using heat cold /modalities at home for pain control Baseline: no knowledge Goal status: INITIAL    LONG TERM GOALS: Target date: 06-27-23  Pt will be independent with advanced HEP Baseline: limited knowledge Goal status: INITIAL  2.  5 x STS imrproves to at least 20 sec to show increased LE strength and decrease risk of fall Baseline: STS 45.3.sec Goal status: INITIAL  3.  Pt will be able to bend and lift to perform household dutes with proper form with increase in pain Baseline: Unable to bend forward more than 25 % Goal status: INITIAL  4.  Pt will be able to ambulate I without AD  Baseline: reliant on SPC at eval and limited walking ability Goal status: INITIAL  5.  FOTO will improve from 31%   to 52%     indicating improved functional mobility Baseline: 31% eval Goal status: INITIAL  6.  Pt will be able to negotiate steps with pain no greater than 3/10 Baseline: avoiding steps Goal status: INITIAL  PLAN:  PT FREQUENCY: 2x/week  PT DURATION: 6 weeks  PLANNED INTERVENTIONS: Therapeutic exercises, Therapeutic activity, Neuromuscular re-education, Balance training, Gait training, Patient/Family education, Self Care, Joint mobilization, Stair training, Dry Needling, Electrical stimulation, Spinal mobilization,  Cryotherapy, Moist heat, Taping, Ionotophoresis 4mg /ml Dexamethasone, Manual therapy, and Re-evaluation.  PLAN FOR NEXT SESSION: Symptom modification strategies as indicated. Work on lumbopelvic stability within pt tolerance, gradual progression to WB as tolerated.    Ashley Murrain PT, DPT 06/11/2023 2:25 PM

## 2023-06-11 ENCOUNTER — Encounter: Payer: Self-pay | Admitting: Physical Therapy

## 2023-06-11 ENCOUNTER — Ambulatory Visit: Payer: Medicare Other | Admitting: Physical Therapy

## 2023-06-11 DIAGNOSIS — M6281 Muscle weakness (generalized): Secondary | ICD-10-CM

## 2023-06-11 DIAGNOSIS — R293 Abnormal posture: Secondary | ICD-10-CM

## 2023-06-11 DIAGNOSIS — M25552 Pain in left hip: Secondary | ICD-10-CM | POA: Diagnosis not present

## 2023-06-11 DIAGNOSIS — M5442 Lumbago with sciatica, left side: Secondary | ICD-10-CM | POA: Diagnosis not present

## 2023-06-11 DIAGNOSIS — G8929 Other chronic pain: Secondary | ICD-10-CM | POA: Diagnosis not present

## 2023-06-11 NOTE — Progress Notes (Signed)
No showed

## 2023-06-13 ENCOUNTER — Ambulatory Visit (HOSPITAL_BASED_OUTPATIENT_CLINIC_OR_DEPARTMENT_OTHER): Payer: Medicare Other | Admitting: Pulmonary Disease

## 2023-06-13 ENCOUNTER — Ambulatory Visit: Payer: Medicare Other | Admitting: Physical Therapy

## 2023-06-18 ENCOUNTER — Telehealth: Payer: Self-pay | Admitting: Oncology

## 2023-06-18 ENCOUNTER — Encounter: Payer: Self-pay | Admitting: Physical Therapy

## 2023-06-18 ENCOUNTER — Other Ambulatory Visit (HOSPITAL_COMMUNITY): Payer: Self-pay | Admitting: Pulmonary Disease

## 2023-06-18 ENCOUNTER — Telehealth (HOSPITAL_BASED_OUTPATIENT_CLINIC_OR_DEPARTMENT_OTHER): Payer: Self-pay | Admitting: Pulmonary Disease

## 2023-06-18 ENCOUNTER — Ambulatory Visit: Payer: Medicare Other | Admitting: Physical Therapy

## 2023-06-18 DIAGNOSIS — R1900 Intra-abdominal and pelvic swelling, mass and lump, unspecified site: Secondary | ICD-10-CM

## 2023-06-18 DIAGNOSIS — R293 Abnormal posture: Secondary | ICD-10-CM | POA: Diagnosis not present

## 2023-06-18 DIAGNOSIS — G8929 Other chronic pain: Secondary | ICD-10-CM | POA: Diagnosis not present

## 2023-06-18 DIAGNOSIS — M6281 Muscle weakness (generalized): Secondary | ICD-10-CM | POA: Diagnosis not present

## 2023-06-18 DIAGNOSIS — M5442 Lumbago with sciatica, left side: Secondary | ICD-10-CM | POA: Diagnosis not present

## 2023-06-18 DIAGNOSIS — M25552 Pain in left hip: Secondary | ICD-10-CM | POA: Diagnosis not present

## 2023-06-18 NOTE — Telephone Encounter (Signed)
Discussed PET CT findings with patient. Arranged for follow-up with Dr. Delton Coombes to review findings and discuss bronchoscopy.   PET is also concerning for anterior pelvic mass. Patient reports history >20 years ago of gynecologic malignancy requiring hysterectomy and bilateral oophorectomy. I have contacted her PCP for additional records and will refer to Gyn Onc.  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 All biopsies and lymph nodes were negative. Per note on 04/05/10 in EMR. No recurrence by Dr. Ermelinda Das OB/Gyn Onc

## 2023-06-18 NOTE — Therapy (Signed)
OUTPATIENT PHYSICAL THERAPY TREATMENT NOTE   Patient Name: Tiffany [name] MRN: [id number] DOB:11-03-1946, 77 y.o., female Today's Date: 06/18/2023  END OF SESSION:  PT End of Session - 06/18/23 1316     Visit Number 7    Number of Visits 12    Date for PT Re-Evaluation 06/27/23    Authorization Type [employer] medicare    Progress Note Due on Visit 10    PT Start Time 1316    PT Stop Time 1410    PT Time Calculation (min) 54 min             Past Medical History:  Diagnosis Date   Abnormal PFT 09/02/2014   moderate restrictive ventilatory defect.   BCC (basal cell carcinoma), face 2010   Dr. Terri Piedra   Colon polyp 12/2003   Diverticulosis 11/2010   seen on colonoscopy and air contrast BE   Emphysema of lung (HCC)    Hyperlipidemia    Hypertension    previously treated with meds, resolved   MGUS (monoclonal gammopathy of unknown significance) 1999   prevously under care of Dr. 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 Laural Benes. Diverticulosis. 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 It hurts a little bit right now while I am sitting here, 6/10. I had awful pain 2 days ago. I could not do anything.   PERTINENT HISTORY:  Osteopenia, tobacco abuse, basal cell carcinoma face, diverticulosis, emphysema, HTN Hyperlipidemia, hysterectomy. See Med hx for full history COPD,    PAIN:  Are you having pain? Yes: NPRS scale: at rest is a 6/10 and with walking it is 7/10 Pain location: Left low back pain into my buttock to my knee Pain description: not as sharp of a pain Aggravating factors: getting in and out of shower, driving, sleeping and turning over, can't do housework or dishes or laundry because my washer and dishwasher are downstairs.  Relieving factors: nothing makes it better  PRECAUTIONS: None  WEIGHT BEARING RESTRICTIONS: No  FALLS:  Has patient fallen in last 6 months? Yes. Number of falls 1 after mothers day   LIVING ENVIRONMENT: Lives with: lives with their spouse Lives in: House/apartment Stairs: Yes: Internal: 12 steps; on right going up and External: 3 steps; can reach both Has following equipment at  home: Single point cane, Walker - 2 wheeled, and uses a stool to prop leg when cooking  OCCUPATION: retired  PLOF: Independent  not using a cane  PATIENT GOALSMake back better so I can do stuff:   NEXT MD VISIT: wellness check 05-22-23  OBJECTIVE: (objective measures completed at initial evaluation unless otherwise dated)   DIAGNOSTIC FINDINGS:  EXAM:6-12-02-22 LUMBAR SPINE - COMPLETE 4+ VIEW   COMPARISON:  None Available.   FINDINGS: Moderate levoscoliosis of lumbar spine is noted. Moderate lateral subluxation of L3 on L4 to the left is noted. No fracture or spondylolisthesis is noted. Severe degenerative disc disease is noted at L2-3 and L3-4.   IMPRESSION: Severe multilevel degenerative disc disease. No acute abnormality seen. Moderate levoscoliosis of lumbar spine is noted.   Aortic Atherosclerosis (ICD10-I70.0).  PATIENT SURVEYS:  FOTO 31%  52% predicted FOTO 06/11/23: 55 current   SCREENING FOR RED FLAGS: Bowel or bladder incontinence: No   COGNITION: Overall cognitive status: Within functional limits for tasks assessed     SENSATION: WFL  but pain radiating (sharp) into left buttock down to knee  MUSCLE LENGTH: Hamstrings: Right 69 deg; Left 70 deg   POSTURE: rounded shoulders, forward head, and scoliosis Levo scoliosis Left SIJ posterior pelvic rotation and Side bend posture Left standing .  PALPATION: Left paraspinals with increased tension, TTP on LT buttock and LT SIJ  LUMBAR ROM:   AROM eval 06/11/23  Flexion Limited 75% fingertips to thigh   Extension Limited 75%   Right lateral flexion Limited 75%   Left lateral flexion Limited 75%   Right rotation Limited 50% 75%   Left rotation Limited 50% 75% mild pain   (Blank rows = not tested)  LOWER EXTREMITY ROM:     Active  Right eval Left eval Right 06/07/23 Left 06/07/23  Hip flexion 50 deg  50 Deg p! 115 115  Hip extension      Hip abduction      Hip adduction      Hip internal rotation 30  21 p!    Hip external rotation 35 29 p!    Knee flexion      Knee extension      Ankle dorsiflexion      Ankle plantarflexion      Ankle inversion  Ankle eversion       (Blank rows = not tested)  LOWER EXTREMITY MMT:   Not tested due to pt in 7-9/10 pain  MMT Right eval Left eval  Hip flexion    Hip extension    Hip abduction    Hip adduction    Hip internal rotation    Hip external rotation    Knee flexion    Knee extension    Ankle dorsiflexion    Ankle plantarflexion    Ankle inversion    Ankle eversion     (Blank rows = not tested)  LUMBAR SPECIAL TESTS:  Straight leg raise test: Negative and Slump test: Positive Slump test 05/22/23 negative bilaterally   FUNCTIONAL TESTS:  5 times sit to stand: 45.3 sec 06/07/23: 22.6 sec hands on thighs   GAIT: Distance walked: 150 Assistive device utilized: Single point cane Level of assistance: Modified independence Comments: Pt with slow cadenced and  R lean  TODAY'S TREATMENT:                                                                                                                              [facility] Adult PT Treatment:                                                DATE: 06/18/23 Therapeutic Exercise: Seated lumbar flexion stretch STS x 5 with UE to rise and no UE to lower  Standing heel raises  Standing hip abduction - increased pain when bearing weight on left  SKTC LTR Hooklying red band clams Hooklying March with red band  Hooklying ball squeeze  Supine with legs over ball - alternating UE and LE lifts Supine h/s curls with feet on ball  Manual Therapy: MTPR x 3 left lumbar paraspinal  Therapeutic Activity: STS education on hip hinge. Flexed posture, will need reinforcement  Modalities: HMP x 10 minutes after manual    [facility] Adult PT Treatment:                                                DATE: 06/11/23 Therapeutic Exercise: Bridge 3x10 cues for comfortable ROM  Hooklying swiss ball press down  2x12 cues for breath control Seated adduction iso x25 Heel raises, standing at counter 2x8  Seated swiss ball flexion x13 cues for comfortable ROM HEP update + education  Therapeutic Activity: FOTO + education STS x5 from lowest mat no UE support cues for positioning/mechanics  Mini squat 2x5 cues for BOS and trunk mechanics    [facility] Adult PT Treatment:  DATE: 06/07/23 Therapeutic Exercise: Hooklying ball squeeze with ab draw in 5 sec x 10 Hooklying clam green band  x 10  Hooklying march green band x 10 each Hook lying Ab isometric using exercise ball 3 sec 2 x 10  Bridge 5 sec x 5 - h/s cramp Bridge with feet over ball 5 sec x 8 SKTC  LTR  Figure 4 push and pull  Standing row 10 x 2 green  Standing hip abduction x 8 each     [facility] Adult PT Treatment:                                                DATE: 06/05/23 Therapeutic Exercise: Seated hip IR red band and ball as fulcrum 3x10 cues for setup Seated hip ER red band and ball as fulcrum 3x10 cues for setup Seated marches red band 2x10 cues for posture and trunk positioning STS 2x8 from chair, gentle UE support cues for pacing  Standing red band row 2x10 cues for posture and core involvement Seated triple ext LLE into ball x12 cues for form and setup Seated swiss ball press down x15 cues for breath control and setup      PATIENT EDUCATION:  Education details: rationale for interventions, HEP  Person educated: Patient Education method: Explanation, Demonstration, Tactile cues, Verbal cues, and Handouts Education comprehension: verbalized understanding, returned demonstration, tactile cues required, and needs further education  HOME EXERCISE PROGRAM: Access Code: [email or web address] URL: [email or web address] Date: 06/11/2023 Prepared by: Fransisco Hertz  Exercises - Seated March  - 2 x daily - 7 x weekly - 1 sets - 10 reps - Supine Hip Adduction Isometric with Ball  - 2  x daily - 7 x weekly - 1 sets - 10 reps - Seated Hip Abduction with Resistance  - 2 x daily - 7 x weekly - 1 sets - 10 reps - Seated Flexion Stretch  - 2 x daily - 7 x weekly - 1 sets - 10 reps Added LTR   ASSESSMENT:  CLINICAL IMPRESSION: 06/18/2023 Pt arrives w 6/10 pain, had a day of severe pain 2 days ago, could not function. Her pain continues to be left sided and radiate into left leg. Began hp hinge training with STS, needs mod cues. Will need additional work on hip hinge/STS mechanics. Trial of standing hip abduction with increased pain so discontinued. Did well with supine lumbar stabilization with legs supported on exercise ball. Manual TPR release performed x 3 to left lumbar paraspinals followed by HMP. She reports that she stopped using heat and ice since her injury and she was encouraged to resume pain management strategies. Will assess response to manual/ HMP next visit.       OBJECTIVE IMPAIRMENTS: decreased activity tolerance, decreased mobility, difficulty walking, decreased ROM, decreased strength, postural dysfunction, and pain.   ACTIVITY LIMITATIONS: carrying, lifting, bending, sitting, standing, squatting, transfers, bathing, reach over head, and locomotion level  PARTICIPATION LIMITATIONS: meal prep, cleaning, laundry, driving, and shopping  PERSONAL FACTORS: Osteopenia, tobacco abuse, basal cell carcinoma face, diverticulosis, emphysema, HTN Hyperlipidemia, hysterectomy. See Med hx for full history COPD,  are also affecting patient's functional outcome.   REHAB POTENTIAL: Fair    CLINICAL DECISION MAKING: Unstable/unpredictable  EVALUATION COMPLEXITY: Moderate   GOALS: Goals reviewed with patient? No  SHORT TERM GOALS: Target date: 06-06-23 Pt will be independent with initial  HEP Baseline:limited knowledge 06/05/23: good HEP adherence reported Goal status: ONGOING  2.  5 x STS improves to at least 30 sec Baseline: 45.3 eval 06/07/23: 22.6 sec Goal status:  MET  3.  Pt will be able to stand with upright posture and decreased pain to at least 5/10 Baseline: unable to stand upright and pain at 9/10 06/05/23: 6/10 today Goal status: ONGOING  4.  Pt will be able to verbalize 3 ways to decrease pain using heat cold /modalities at home for pain control Baseline: no knowledge 06/18/23: Trial of HMP in clinic Goal status: ONGOING    LONG TERM GOALS: Target date: 06-27-23  Pt will be independent with advanced HEP Baseline: limited knowledge Goal status: INITIAL  2.  5 x STS imrproves to at least 20 sec to show increased LE strength and decrease risk of fall Baseline: STS 45.3.sec Goal status: INITIAL  3.  Pt will be able to bend and lift to perform household dutes with proper form with increase in pain Baseline: Unable to bend forward more than 25 % Goal status: INITIAL  4.  Pt will be able to ambulate I without AD  Baseline: reliant on SPC at eval and limited walking ability Goal status: INITIAL  5.  FOTO will improve from 31%   to 52%     indicating improved functional mobility Baseline: 31% eval Goal status: INITIAL  6.  Pt will be able to negotiate steps with pain no greater than 3/10 Baseline: avoiding steps Goal status: INITIAL  PLAN:  PT FREQUENCY: 2x/week  PT DURATION: 6 weeks  PLANNED INTERVENTIONS: Therapeutic exercises, Therapeutic activity, Neuromuscular re-education, Balance training, Gait training, Patient/Family education, Self Care, Joint mobilization, Stair training, Dry Needling, Electrical stimulation, Spinal mobilization, Cryotherapy, Moist heat, Taping, Ionotophoresis 4mg /ml Dexamethasone, Manual therapy, and Re-evaluation.  PLAN FOR NEXT SESSION: Symptom modification strategies as indicated. Work on lumbopelvic stability within pt tolerance, gradual progression to WB as tolerated.    Jannette Spanner, PTA 06/18/23 2:54 PM Phone: [phone] Fax: [phone]

## 2023-06-18 NOTE — Telephone Encounter (Signed)
Called Tiffany [name] and advised her of new patient appointment with Dr. Bertis Ruddy on 07/04/23 at 1:00 with arrival at 12:30.  Also discussed that we are working on scheduling with the [facility] doctors as well.  Gave her my contact information to call with any questions.

## 2023-06-19 ENCOUNTER — Telehealth: Payer: Self-pay

## 2023-06-19 NOTE — Therapy (Signed)
OUTPATIENT PHYSICAL THERAPY TREATMENT NOTE   Patient Name: Tiffany [name] MRN: [id number] DOB:Apr 12, 1946, 77 y.o., female Today's Date: 06/20/2023  END OF SESSION:  PT End of Session - 06/20/23 1316     Visit Number 8    Number of Visits 12    Date for PT Re-Evaluation 06/27/23    Authorization Type [employer] medicare    Progress Note Due on Visit 10    PT Start Time 1317    PT Stop Time 1400    PT Time Calculation (min) 43 min    Activity Tolerance Patient tolerated treatment well;No increased pain    Behavior During Therapy [facility] for tasks assessed/performed              Past Medical History:  Diagnosis Date   Abnormal PFT 09/02/2014   moderate restrictive ventilatory defect.   BCC (basal cell carcinoma), face 2010   Dr. Terri Piedra   Colon polyp 12/2003   Diverticulosis 11/2010   seen on colonoscopy and air contrast BE   Emphysema of lung (HCC)    Hyperlipidemia    Hypertension    previously treated with meds, resolved   MGUS (monoclonal gammopathy of unknown significance) 1999   prevously under care of Dr. 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 Laural Benes. Diverticulosis. 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 Felt okay after last session. Still been doing HEP without issue. Feels she is back to baseline after exacerbation earlier in the week.     PERTINENT HISTORY:  Osteopenia, tobacco abuse, basal cell carcinoma face, diverticulosis, emphysema, HTN Hyperlipidemia, hysterectomy. See Med hx for full history COPD,    PAIN:  Are you having pain? Yes: NPRS scale: at rest is a 6/10 and with walking it is 7/10 Pain location: Left low back pain into my buttock to my knee Pain description: not as sharp of a pain Aggravating factors: getting in and out of shower, driving, sleeping and turning over, can't do housework or dishes or laundry because my washer and dishwasher are downstairs.  Relieving factors: nothing makes it better  PRECAUTIONS: None  WEIGHT BEARING RESTRICTIONS: No  FALLS:  Has patient fallen in last 6 months? Yes. Number of falls 1 after mothers day   LIVING ENVIRONMENT: Lives  with: lives with their spouse Lives in: House/apartment Stairs: Yes: Internal: 12 steps; on right going up and External: 3 steps; can reach both Has following equipment at home: Single point cane, Walker - 2 wheeled, and uses a stool to prop leg when cooking  OCCUPATION: retired  PLOF: Independent  not using a cane  PATIENT GOALSMake back better so I can do stuff:   NEXT MD VISIT: wellness check 05-22-23  OBJECTIVE: (objective measures completed at initial evaluation unless otherwise dated)   DIAGNOSTIC FINDINGS:  EXAM:6-12-02-22 LUMBAR SPINE - COMPLETE 4+ VIEW   COMPARISON:  None Available.   FINDINGS: Moderate levoscoliosis of lumbar spine is noted. Moderate lateral subluxation of L3 on L4 to the left is noted. No fracture or spondylolisthesis is noted. Severe degenerative disc disease is noted at L2-3 and L3-4.   IMPRESSION: Severe multilevel degenerative disc disease. No acute abnormality seen. Moderate levoscoliosis of lumbar spine is noted.   Aortic Atherosclerosis (ICD10-I70.0).  PATIENT SURVEYS:  FOTO 31%  52% predicted FOTO 06/11/23: 55 current   SCREENING FOR RED FLAGS: Bowel or bladder incontinence: No   COGNITION: Overall cognitive status: Within functional limits for tasks assessed     SENSATION: WFL  but pain radiating (sharp) into left buttock down to knee  MUSCLE LENGTH: Hamstrings: Right 69 deg; Left 70 deg   POSTURE: rounded shoulders, forward head, and scoliosis Levo scoliosis Left SIJ posterior pelvic rotation and Side bend posture Left standing .  PALPATION: Left paraspinals with increased tension, TTP on LT buttock and LT SIJ  LUMBAR ROM:   AROM eval 06/11/23  Flexion Limited 75% fingertips to thigh   Extension Limited 75%   Right lateral flexion Limited 75%   Left lateral flexion Limited 75%   Right rotation Limited 50% 75%   Left rotation Limited 50% 75% mild pain   (Blank rows = not tested)  LOWER EXTREMITY ROM:     Active   Right eval Left eval Right 06/07/23 Left 06/07/23  Hip flexion 50 deg  50 Deg p! 115 115  Hip extension      Hip abduction      Hip adduction      Hip internal rotation 30 21 p!    Hip external rotation 35 29 p!    Knee flexion      Knee extension      Ankle dorsiflexion      Ankle plantarflexion      Ankle inversion      Ankle eversion       (  Blank rows = not tested)  LOWER EXTREMITY MMT:   Not tested due to pt in 7-9/10 pain  MMT Right eval Left eval  Hip flexion    Hip extension    Hip abduction    Hip adduction    Hip internal rotation    Hip external rotation    Knee flexion    Knee extension    Ankle dorsiflexion    Ankle plantarflexion    Ankle inversion    Ankle eversion     (Blank rows = not tested)  LUMBAR SPECIAL TESTS:  Straight leg raise test: Negative and Slump test: Positive Slump test 05/22/23 negative bilaterally  06/20/23 negative slump  FUNCTIONAL TESTS:  5 times sit to stand: 45.3 sec 06/07/23: 22.6 sec hands on thighs   GAIT: Distance walked: 150 Assistive device utilized: Single point cane Level of assistance: Modified independence Comments: Pt with slow cadenced and  R lean  TODAY'S TREATMENT:                                                                                                                              [facility] Adult PT Treatment:                                                DATE: 06/20/23 Therapeutic Exercise: Seated swiss ball press down 2x12 cues for breath control and appropriate pressure Swiss ball sidebending iso 2x8 Bil cues for form and posture  Standing march x12 BIL with unilat UE support, second set with red band x8 each LE Standing paloff red band 3x5 seated rest cues for form and posture Seated swiss ball flexion x10 cues for form and comfortable ROM  Seated swiss ball lateral rollout x5 each direction cues for comfortable ROM    [facility] Adult PT Treatment:                                                DATE:  06/18/23 Therapeutic Exercise: Seated lumbar flexion stretch STS x 5 with UE to rise and no UE to lower  Standing heel raises  Standing hip abduction - increased pain when bearing weight on left  SKTC LTR Hooklying red band clams Hooklying March with red band  Hooklying ball squeeze  Supine with legs over ball - alternating UE and LE lifts Supine h/s curls with feet on ball  Manual Therapy: MTPR x 3 left lumbar paraspinal  Therapeutic Activity: STS education on hip hinge. Flexed posture, will need reinforcement  Modalities: HMP x 10 minutes after manual    [facility] Adult PT Treatment:  DATE: 06/11/23 Therapeutic Exercise: Bridge 3x10 cues for comfortable ROM  Hooklying swiss ball press down 2x12 cues for breath control Seated adduction iso x25 Heel raises, standing at counter 2x8  Seated swiss ball flexion x13 cues for comfortable ROM HEP update + education  Therapeutic Activity: FOTO + education STS x5 from lowest mat no UE support cues for positioning/mechanics  Mini squat 2x5 cues for BOS and trunk mechanics    [facility] Adult PT Treatment:                                                DATE: 06/07/23 Therapeutic Exercise: Hooklying ball squeeze with ab draw in 5 sec x 10 Hooklying clam green band  x 10  Hooklying march green band x 10 each Hook lying Ab isometric using exercise ball 3 sec 2 x 10  Bridge 5 sec x 5 - h/s cramp Bridge with feet over ball 5 sec x 8 SKTC  LTR  Figure 4 push and pull  Standing row 10 x 2 green  Standing hip abduction x 8 each     [facility] Adult PT Treatment:                                                DATE: 06/05/23 Therapeutic Exercise: Seated hip IR red band and ball as fulcrum 3x10 cues for setup Seated hip ER red band and ball as fulcrum 3x10 cues for setup Seated marches red band 2x10 cues for posture and trunk positioning STS 2x8 from chair, gentle UE support cues for pacing  Standing red  band row 2x10 cues for posture and core involvement Seated triple ext LLE into ball x12 cues for form and setup Seated swiss ball press down x15 cues for breath control and setup      PATIENT EDUCATION:  Education details: rationale for interventions, HEP  Person educated: Patient Education method: Explanation, Demonstration, Tactile cues, Verbal cues, and Handouts Education comprehension: verbalized understanding, returned demonstration, tactile cues required, and needs further education  HOME EXERCISE PROGRAM: Access Code: [email or web address] URL: [email or web address] Date: 06/11/2023 Prepared by: Fransisco Hertz  Exercises - Seated March  - 2 x daily - 7 x weekly - 1 sets - 10 reps - Supine Hip Adduction Isometric with Ball  - 2 x daily - 7 x weekly - 1 sets - 10 reps - Seated Hip Abduction with Resistance  - 2 x daily - 7 x weekly - 1 sets - 10 reps - Seated Flexion Stretch  - 2 x daily - 7 x weekly - 1 sets - 10 reps Added LTR   ASSESSMENT:  CLINICAL IMPRESSION: 06/20/2023 Pt arrives w/ report of 6/10 pain, states she seems to be back to baseline after exacerbation earlier in the week. Today working on continued core activation/stability, cues as above. Increased time spent in WB - some irritation of symptoms but mitigated by seated rest. No adverse events, reports 7/10 pain on departure. Recommend continuing along current POC in order to address relevant deficits and improve functional tolerance. Pt departs today's session in no acute distress, all voiced questions/concerns addressed appropriately from PT perspective.       OBJECTIVE IMPAIRMENTS: decreased activity tolerance, decreased mobility, difficulty walking, decreased ROM,  decreased strength, postural dysfunction, and pain.   ACTIVITY LIMITATIONS: carrying, lifting, bending, sitting, standing, squatting, transfers, bathing, reach over head, and locomotion level  PARTICIPATION LIMITATIONS: meal prep, cleaning,  laundry, driving, and shopping  PERSONAL FACTORS: Osteopenia, tobacco abuse, basal cell carcinoma face, diverticulosis, emphysema, HTN Hyperlipidemia, hysterectomy. See Med hx for full history COPD,  are also affecting patient's functional outcome.   REHAB POTENTIAL: Fair    CLINICAL DECISION MAKING: Unstable/unpredictable  EVALUATION COMPLEXITY: Moderate   GOALS: Goals reviewed with patient? No  SHORT TERM GOALS: Target date: 06-06-23 Pt will be independent with initial HEP Baseline:limited knowledge 06/05/23: good HEP adherence reported Goal status: ONGOING  2.  5 x STS improves to at least 30 sec Baseline: 45.3 eval 06/07/23: 22.6 sec Goal status: MET  3.  Pt will be able to stand with upright posture and decreased pain to at least 5/10 Baseline: unable to stand upright and pain at 9/10 06/05/23: 6/10 today Goal status: ONGOING  4.  Pt will be able to verbalize 3 ways to decrease pain using heat cold /modalities at home for pain control Baseline: no knowledge 06/18/23: Trial of HMP in clinic Goal status: ONGOING    LONG TERM GOALS: Target date: 06-27-23  Pt will be independent with advanced HEP Baseline: limited knowledge Goal status: INITIAL  2.  5 x STS imrproves to at least 20 sec to show increased LE strength and decrease risk of fall Baseline: STS 45.3.sec Goal status: INITIAL  3.  Pt will be able to bend and lift to perform household dutes with proper form with increase in pain Baseline: Unable to bend forward more than 25 % Goal status: INITIAL  4.  Pt will be able to ambulate I without AD  Baseline: reliant on SPC at eval and limited walking ability Goal status: INITIAL  5.  FOTO will improve from 31%   to 52%     indicating improved functional mobility Baseline: 31% eval Goal status: INITIAL  6.  Pt will be able to negotiate steps with pain no greater than 3/10 Baseline: avoiding steps Goal status: INITIAL  PLAN:  PT FREQUENCY: 2x/week  PT  DURATION: 6 weeks  PLANNED INTERVENTIONS: Therapeutic exercises, Therapeutic activity, Neuromuscular re-education, Balance training, Gait training, Patient/Family education, Self Care, Joint mobilization, Stair training, Dry Needling, Electrical stimulation, Spinal mobilization, Cryotherapy, Moist heat, Taping, Ionotophoresis 4mg /ml Dexamethasone, Manual therapy, and Re-evaluation.  PLAN FOR NEXT SESSION: Symptom modification strategies as indicated. Work on lumbopelvic stability within pt tolerance, gradual progression with WB as tolerated.     Ashley Murrain PT, DPT 06/20/2023 2:02 PM

## 2023-06-19 NOTE — Telephone Encounter (Signed)
Spoke with the patient regarding the referral to GYN oncology. Patient scheduled as new patient with Dr Pricilla Holm on 07/05/2023. Patient given an arrival time of 9:15am.  Explained to the patient the the doctor will perform a pelvic exam at this visit. Patient given the policy that only one visitor allowed and that visitor must be over 16 yrs are allowed in the Cancer Center. Patient given the address/phone number for the clinic and that the center offers free valet service. Patient aware that masks are option.

## 2023-06-20 ENCOUNTER — Encounter: Payer: Self-pay | Admitting: Physical Therapy

## 2023-06-20 ENCOUNTER — Ambulatory Visit: Payer: Medicare Other | Admitting: Physical Therapy

## 2023-06-20 DIAGNOSIS — M25552 Pain in left hip: Secondary | ICD-10-CM

## 2023-06-20 DIAGNOSIS — G8929 Other chronic pain: Secondary | ICD-10-CM | POA: Diagnosis not present

## 2023-06-20 DIAGNOSIS — M6281 Muscle weakness (generalized): Secondary | ICD-10-CM

## 2023-06-20 DIAGNOSIS — M5442 Lumbago with sciatica, left side: Secondary | ICD-10-CM | POA: Diagnosis not present

## 2023-06-20 DIAGNOSIS — R293 Abnormal posture: Secondary | ICD-10-CM | POA: Diagnosis not present

## 2023-06-24 NOTE — Therapy (Signed)
OUTPATIENT PHYSICAL THERAPY TREATMENT NOTE   Patient Name: Tiffany [name] MRN: [id number] DOB:Mar 18, 1946, 77 y.o., female Today's Date: 06/25/2023  END OF SESSION:  PT End of Session - 06/25/23 1314     Visit Number 9    Number of Visits 12    Date for PT Re-Evaluation 06/27/23    Authorization Type [employer] medicare    Progress Note Due on Visit 10    PT Start Time 1316    PT Stop Time 1357    PT Time Calculation (min) 41 min    Activity Tolerance Patient tolerated treatment well    Behavior During Therapy [facility]  for tasks assessed/performed               Past Medical History:  Diagnosis Date   Abnormal PFT 09/02/2014   moderate restrictive ventilatory defect.   BCC (basal cell carcinoma), face 2010   Dr. Terri Piedra   Colon polyp 12/2003   Diverticulosis 11/2010   seen on colonoscopy and air contrast BE   Emphysema of lung (HCC)    Hyperlipidemia    Hypertension    previously treated with meds, resolved   MGUS (monoclonal gammopathy of unknown significance) 1999   prevously under care of Dr. 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 Laural Benes. Diverticulosis. 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 Notes improvement in shower transfers, standing tolerance. HEP going well.     PERTINENT HISTORY:  Osteopenia, tobacco abuse, basal cell carcinoma face, diverticulosis, emphysema, HTN Hyperlipidemia, hysterectomy. See Med hx for full history COPD,    PAIN:  Are you having pain? Yes: NPRS scale: at rest is a 6/10 and with walking it is 7/10 Pain location: Left low back pain into my buttock to my knee Pain description: not as sharp of a pain Aggravating factors: getting in and out of shower, driving, sleeping and turning over, can't do housework or dishes or laundry because my washer and dishwasher are downstairs.  Relieving factors: nothing makes it better  PRECAUTIONS: None  WEIGHT BEARING RESTRICTIONS: No  FALLS:  Has patient fallen in last 6 months? Yes. Number of falls 1 after mothers day   LIVING ENVIRONMENT: Lives with: lives with their spouse Lives in: House/apartment Stairs: Yes: Internal:  12 steps; on right going up and External: 3 steps; can reach both Has following equipment at home: Single point cane, Walker - 2 wheeled, and uses a stool to prop leg when cooking  OCCUPATION: retired  PLOF: Independent  not using a cane  PATIENT GOALSMake back better so I can do stuff:   NEXT MD VISIT: wellness check 05-22-23  OBJECTIVE: (objective measures completed at initial evaluation unless otherwise dated)   DIAGNOSTIC FINDINGS:  EXAM:6-12-02-22 LUMBAR SPINE - COMPLETE 4+ VIEW   COMPARISON:  None Available.   FINDINGS: Moderate levoscoliosis of lumbar spine is noted. Moderate lateral subluxation of L3 on L4 to the left is noted. No fracture or spondylolisthesis is noted. Severe degenerative disc disease is noted at L2-3 and L3-4.   IMPRESSION: Severe multilevel degenerative disc disease. No acute abnormality seen. Moderate levoscoliosis of lumbar spine is noted.   Aortic Atherosclerosis (ICD10-I70.0).  PATIENT SURVEYS:  FOTO 31%  52% predicted FOTO 06/11/23: 55 current   SCREENING FOR RED FLAGS: Bowel or bladder incontinence: No   COGNITION: Overall cognitive status: Within functional limits for tasks assessed     SENSATION: WFL  but pain radiating (sharp) into left buttock down to knee  MUSCLE LENGTH: Hamstrings: Right 69 deg; Left 70 deg   POSTURE: rounded shoulders, forward head, and scoliosis Levo scoliosis Left SIJ posterior pelvic rotation and Side bend posture Left standing .  PALPATION: Left paraspinals with increased tension, TTP on LT buttock and LT SIJ  LUMBAR ROM:   AROM eval 06/11/23  Flexion Limited 75% fingertips to thigh   Extension Limited 75%   Right lateral flexion Limited 75%   Left lateral flexion Limited 75%   Right rotation Limited 50% 75%   Left rotation Limited 50% 75% mild pain   (Blank rows = not tested)  LOWER EXTREMITY ROM:     Active  Right eval Left eval Right 06/07/23 Left 06/07/23  Hip flexion 50 deg  50 Deg p!  115 115  Hip extension      Hip abduction      Hip adduction      Hip internal rotation 30 21 p!    Hip external rotation 35 29 p!    Knee flexion      Knee extension      Ankle dorsiflexion      Ankle plantarflexion      Ankle inversion      Ankle eversion       (Blank rows = not tested)  LOWER EXTREMITY MMT:   Not  tested due to pt in 7-9/10 pain  MMT Right eval Left eval  Hip flexion    Hip extension    Hip abduction    Hip adduction    Hip internal rotation    Hip external rotation    Knee flexion    Knee extension    Ankle dorsiflexion    Ankle plantarflexion    Ankle inversion    Ankle eversion     (Blank rows = not tested)  LUMBAR SPECIAL TESTS:  Straight leg raise test: Negative and Slump test: Positive Slump test 05/22/23 negative bilaterally  06/20/23 negative slump  FUNCTIONAL TESTS:  5 times sit to stand: 45.3 sec 06/07/23: 22.6 sec hands on thighs   GAIT: Distance walked: 150 Assistive device utilized: Single point cane Level of assistance: Modified independence Comments: Pt with slow cadenced and  R lean  TODAY'S TREATMENT:                                                                                                                              [facility] Adult PT Treatment:                                                DATE: 06/25/23 Therapeutic Exercise: Seated swiss ball flexion x12 cues for comfortable ROM  Standing swiss ball press down iso 2x10 cues for breath control  Red band marches 2x8 BIL with UE support, standing Red band paloff press 2x8 BIL cues for posture and appropriate form  Seated red band hip abduction 2x15 SKTC 3x30sec SKTC cross body 3x30sec  Standing heel raise x12 STS x8 from lowest mat  HEP update + education    [facility] Adult PT Treatment:                                                DATE: 06/20/23 Therapeutic Exercise: Seated swiss ball press down 2x12 cues for breath control and appropriate pressure Swiss ball  sidebending iso 2x8 Bil cues for form and posture  Standing march x12 BIL with unilat UE support, second set with red band x8 each LE Standing paloff red band 3x5 seated rest cues for form and posture Seated swiss ball flexion x10 cues for form and comfortable ROM  Seated swiss ball lateral rollout x5 each direction cues for comfortable ROM    [facility] Adult PT Treatment:                                                DATE: 06/18/23 Therapeutic Exercise: Seated lumbar flexion stretch  STS x 5 with UE to rise and no UE to lower  Standing heel raises  Standing hip abduction - increased pain when bearing weight on left  SKTC LTR Hooklying red band clams Hooklying March with red band  Hooklying ball squeeze  Supine with legs over ball - alternating UE and LE lifts Supine h/s curls with feet on ball  Manual Therapy: MTPR x 3 left lumbar paraspinal  Therapeutic Activity: STS education on hip hinge. Flexed posture, will need reinforcement  Modalities: HMP x 10 minutes after manual    [facility] Adult PT Treatment:                                                DATE: 06/11/23 Therapeutic Exercise: Bridge 3x10 cues for comfortable ROM  Hooklying swiss ball press down 2x12 cues for breath control Seated adduction iso x25 Heel raises, standing at counter 2x8  Seated swiss ball flexion x13 cues for comfortable ROM HEP update + education  Therapeutic Activity: FOTO + education STS x5 from lowest mat no UE support cues for positioning/mechanics  Mini squat 2x5 cues for BOS and trunk mechanics    [facility] Adult PT Treatment:                                                DATE: 06/07/23 Therapeutic Exercise: Hooklying ball squeeze with ab draw in 5 sec x 10 Hooklying clam green band  x 10  Hooklying march green band x 10 each Hook lying Ab isometric using exercise ball 3 sec 2 x 10  Bridge 5 sec x 5 - h/s cramp Bridge with feet over ball 5 sec x 8 SKTC  LTR  Figure 4 push and pull  Standing row  10 x 2 green  Standing hip abduction x 8 each     [facility] Adult PT Treatment:                                                DATE: 06/05/23 Therapeutic Exercise: Seated hip IR red band and ball as fulcrum 3x10 cues for setup Seated hip ER red band and ball as fulcrum 3x10 cues for setup Seated marches red band 2x10 cues for posture and trunk positioning STS 2x8 from chair, gentle UE support cues for pacing  Standing red band row 2x10 cues for posture and core involvement Seated triple ext LLE into ball x12 cues for form and setup Seated swiss ball press down x15 cues for breath control and setup      PATIENT EDUCATION:  Education details: rationale for interventions, HEP  Person educated: Patient Education method: Explanation, Demonstration, Tactile cues, Verbal cues, and Handouts Education comprehension: verbalized understanding, returned demonstration, tactile cues required, and needs further education  HOME EXERCISE PROGRAM: Access Code: [email or web address] URL: [email or web address] Date: 06/25/2023 Prepared by: Fransisco Hertz  Exercises - Seated March  - 2 x daily - 7 x weekly - 1 sets - 10 reps - Supine Hip Adduction Isometric with Ball  - 2 x daily - 7 x weekly - 1 sets - 10 reps - Seated Hip  Abduction with Resistance  - 2 x daily - 7 x weekly - 1 sets - 10 reps - Seated Flexion Stretch  - 2 x daily - 7 x weekly - 1 sets - 10 reps - Standing March with Counter Support  - 2 x daily - 7 x weekly - 1 sets - 10 reps - Hooklying Single Knee to Chest Stretch  - 2 x daily - 7 x weekly - 1 sets - 2 reps - 30sec hold   ASSESSMENT:  CLINICAL IMPRESSION: 06/25/2023 Pt arrives w/ report of 4/10 pain, endorses slow but consistent progress in functional tolerance. Today continuing to progress time spent in standing to work on WB tolerance and core/hip endurance. Continues to have some symptom irritability that is mitigated by rest breaks. Progress note next visit. Recommend  continuing along current POC in order to address relevant deficits and improve functional tolerance. Pt departs today's session in no acute distress, all voiced questions/concerns addressed appropriately from PT perspective.       OBJECTIVE IMPAIRMENTS: decreased activity tolerance, decreased mobility, difficulty walking, decreased ROM, decreased strength, postural dysfunction, and pain.   ACTIVITY LIMITATIONS: carrying, lifting, bending, sitting, standing, squatting, transfers, bathing, reach over head, and locomotion level  PARTICIPATION LIMITATIONS: meal prep, cleaning, laundry, driving, and shopping  PERSONAL FACTORS: Osteopenia, tobacco abuse, basal cell carcinoma face, diverticulosis, emphysema, HTN Hyperlipidemia, hysterectomy. See Med hx for full history COPD,  are also affecting patient's functional outcome.   REHAB POTENTIAL: Fair    CLINICAL DECISION MAKING: Unstable/unpredictable  EVALUATION COMPLEXITY: Moderate   GOALS: Goals reviewed with patient? No  SHORT TERM GOALS: Target date: 06-06-23 Pt will be independent with initial HEP Baseline:limited knowledge 06/05/23: good HEP adherence reported Goal status: ONGOING  2.  5 x STS improves to at least 30 sec Baseline: 45.3 eval 06/07/23: 22.6 sec Goal status: MET  3.  Pt will be able to stand with upright posture and decreased pain to at least 5/10 Baseline: unable to stand upright and pain at 9/10 06/05/23: 6/10 today Goal status: ONGOING  4.  Pt will be able to verbalize 3 ways to decrease pain using heat cold /modalities at home for pain control Baseline: no knowledge 06/18/23: Trial of HMP in clinic Goal status: ONGOING    LONG TERM GOALS: Target date: 06-27-23  Pt will be independent with advanced HEP Baseline: limited knowledge Goal status: INITIAL  2.  5 x STS imrproves to at least 20 sec to show increased LE strength and decrease risk of fall Baseline: STS 45.3.sec Goal status: INITIAL  3.  Pt will be  able to bend and lift to perform household dutes with proper form with increase in pain Baseline: Unable to bend forward more than 25 % Goal status: INITIAL  4.  Pt will be able to ambulate I without AD  Baseline: reliant on SPC at eval and limited walking ability Goal status: INITIAL  5.  FOTO will improve from 31%   to 52%     indicating improved functional mobility Baseline: 31% eval Goal status: INITIAL  6.  Pt will be able to negotiate steps with pain no greater than 3/10 Baseline: avoiding steps Goal status: INITIAL  PLAN:  PT FREQUENCY: 2x/week  PT DURATION: 6 weeks  PLANNED INTERVENTIONS: Therapeutic exercises, Therapeutic activity, Neuromuscular re-education, Balance training, Gait training, Patient/Family education, Self Care, Joint mobilization, Stair training, Dry Needling, Electrical stimulation, Spinal mobilization, Cryotherapy, Moist heat, Taping, Ionotophoresis 4mg /ml Dexamethasone, Manual therapy, and Re-evaluation.  PLAN FOR NEXT  SESSION: Symptom modification strategies as indicated. Work on lumbopelvic stability within pt tolerance, gradual progression with WB as tolerated.  Progress note next visit   Ashley Murrain PT, DPT 06/25/2023 2:01 PM

## 2023-06-25 ENCOUNTER — Ambulatory Visit: Payer: Medicare Other | Admitting: Physical Therapy

## 2023-06-25 ENCOUNTER — Encounter: Payer: Self-pay | Admitting: Physical Therapy

## 2023-06-25 DIAGNOSIS — M5442 Lumbago with sciatica, left side: Secondary | ICD-10-CM | POA: Diagnosis not present

## 2023-06-25 DIAGNOSIS — R293 Abnormal posture: Secondary | ICD-10-CM | POA: Diagnosis not present

## 2023-06-25 DIAGNOSIS — M25552 Pain in left hip: Secondary | ICD-10-CM

## 2023-06-25 DIAGNOSIS — M6281 Muscle weakness (generalized): Secondary | ICD-10-CM

## 2023-06-25 DIAGNOSIS — G8929 Other chronic pain: Secondary | ICD-10-CM

## 2023-06-26 ENCOUNTER — Ambulatory Visit (INDEPENDENT_AMBULATORY_CARE_PROVIDER_SITE_OTHER): Payer: Medicare Other | Admitting: Emergency Medicine

## 2023-06-26 ENCOUNTER — Encounter: Payer: Self-pay | Admitting: Emergency Medicine

## 2023-06-26 VITALS — BP 132/74 | HR 66 | Temp 98.3°F | Ht 67.0 in | Wt 148.4 lb

## 2023-06-26 DIAGNOSIS — R948 Abnormal results of function studies of other organs and systems: Secondary | ICD-10-CM | POA: Diagnosis not present

## 2023-06-26 DIAGNOSIS — K8689 Other specified diseases of pancreas: Secondary | ICD-10-CM | POA: Diagnosis not present

## 2023-06-26 DIAGNOSIS — R918 Other nonspecific abnormal finding of lung field: Secondary | ICD-10-CM | POA: Diagnosis not present

## 2023-06-26 NOTE — Therapy (Signed)
OUTPATIENT PHYSICAL THERAPY PROGRESS NOTE + DISCHARGE SUMMARY   Patient Name: Tiffany [name] MRN: [id number] DOB:03-15-1946, 77 y.o., female Today's Date: 06/27/2023   PHYSICAL THERAPY DISCHARGE SUMMARY  Visits from Start of Care: 10  Current functional level related to goals / functional outcomes: Able to ambulate w/ and w/o SPC, navigating stairs; performing household and community mobility   Remaining deficits: Pain, mild functional weakness, mild mobility limitations   Education / Equipment: HEP, discharge education, follow up with provider   Patient agrees to discharge. Patient goals were  mostly met . Patient is being discharged due to  meeting majority of rehab goals, being pleased with functional level, and having ongoing medical work up.   END OF SESSION:  PT End of Session - 06/27/23 1313     Visit Number 10    Number of Visits 12    Date for PT Re-Evaluation 06/27/23    Authorization Type [employer] medicare    PT Start Time 1315    PT Stop Time 1359    PT Time Calculation (min) 44 min    Activity Tolerance Patient tolerated treatment well    Behavior During Therapy WFL for tasks assessed/performed                Past Medical History:  Diagnosis Date   Abnormal PFT 09/02/2014   moderate restrictive ventilatory defect.   BCC (basal cell carcinoma), face 2010   Dr. Terri Piedra   Colon polyp 12/2003   Diverticulosis 11/2010   seen on colonoscopy and air contrast BE   Emphysema of lung (HCC)    Hyperlipidemia    Hypertension    previously treated with meds, resolved   MGUS (monoclonal gammopathy of unknown significance) 1999   prevously under care of Dr. 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 Laural Benes. Diverticulosis. BE also done--normal   OMENTECTOMY  11/26/2005   and pelvic lymphadenectomy (with BSO)   SKIN CANCER EXCISION Left 07/2021   neck; pt can't recall type of cancer   TONSILLECTOMY  age 42   VAGINAL HYSTERECTOMY  late 20's   Patient Active Problem List   Diagnosis Date Noted   Abnormal positron emission tomography (PET) scan 06/26/2023   Nocturnal hypoxemia 08/15/2022   Chest pain on exertion 01/08/2022   Aortic atherosclerosis (HCC) 06/03/2018   Atherosclerosis of right coronary artery 06/03/2018   Essential hypertension 12/31/2016   MGUS (monoclonal gammopathy of unknown significance) 10/30/2016   Senile purpura (HCC) 08/31/2015   COPD, 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   Osteopenia 05/12/2012   Vitamin D deficiency 04/28/2012   Pure hypercholesterolemia 04/28/2012   Former smoker 04/03/2012    PCP: Joselyn Arrow, MD   REFERRING PROVIDER: Joselyn Arrow, MD   REFERRING DIAG: M54.50 (ICD-10-CM) - Acute left-sided low back pain without sciatica   Rationale for Evaluation and Treatment: Rehabilitation  THERAPY DIAG:  Chronic bilateral low back pain with left-sided sciatica  Muscle weakness (generalized)  Pain in left hip  Abnormal posture  ONSET DATE: 04-14-23 Mothers day  SUBJECTIVE:                                                                                                                                                                                           SUBJECTIVE STATEMENT: 06/27/2023 Pt states she followed up with provider yesterday re: mass on her lungs and is going to have to have a biopsy 07/09/23, unsure if she will have to have further work up after. At this point she states she is pleased with progress thus far and would like to discharge from PT to focus on medical issues, which is  respected.   PERTINENT HISTORY:  Osteopenia, tobacco abuse, basal cell carcinoma face, diverticulosis, emphysema, HTN Hyperlipidemia, hysterectomy. See Med hx for full history COPD,    PAIN:  Are you having pain? 5/10 at present, 5-7/10 range over past week while moving/WB. No pain while sitting.  Pain location: Left low back pain into my buttock to my knee Pain description: not as sharp of a pain Aggravating factors: getting in and out of shower, driving, sleeping and turning over, can't do housework or dishes or laundry because my washer and dishwasher are downstairs.  Relieving factors: nothing makes it better  PRECAUTIONS: None  WEIGHT BEARING RESTRICTIONS: No  FALLS:  Has patient fallen in last 6 months? Yes. Number of falls 1 after mothers day   LIVING ENVIRONMENT: Lives with: lives with their spouse Lives in: House/apartment Stairs: Yes: Internal: 12 steps; on right going up and External: 3 steps; can reach both Has following equipment at home: Single point cane, Walker - 2 wheeled, and uses a stool to prop leg when cooking  OCCUPATION: retired  PLOF: Independent  not using a cane  PATIENT GOALSMake back better so I can do stuff:   NEXT MD VISIT: wellness check 05-22-23  OBJECTIVE: (objective measures completed at initial evaluation unless otherwise dated)   DIAGNOSTIC FINDINGS:  EXAM:6-12-02-22 LUMBAR SPINE - COMPLETE 4+ VIEW   COMPARISON:  None Available.   FINDINGS: Moderate levoscoliosis of lumbar spine is noted. Moderate lateral subluxation of L3 on L4 to the left is noted. No fracture or spondylolisthesis is noted. Severe degenerative disc disease is noted at L2-3 and L3-4.   IMPRESSION: Severe multilevel degenerative disc disease. No acute abnormality seen. Moderate levoscoliosis of lumbar spine is noted.   Aortic Atherosclerosis (ICD10-I70.0).  PATIENT SURVEYS:  FOTO 31%  52% predicted FOTO 06/11/23: 55 current   SCREENING FOR RED FLAGS: Bowel or  bladder incontinence: No   COGNITION: Overall cognitive status: Within functional limits for tasks assessed     SENSATION: [facility]  but pain radiating (sharp) into left buttock down to knee  MUSCLE LENGTH: Hamstrings: Right 69 deg; Left 70 deg   POSTURE: rounded shoulders, forward head, and scoliosis Levo scoliosis Left SIJ posterior pelvic rotation and Side bend posture Left standing .  PALPATION: Left paraspinals with increased tension, TTP on LT buttock and LT SIJ  LUMBAR ROM:   AROM eval 06/11/23 06/27/23  Flexion Limited 75% fingertips to thigh    Extension Limited 75%    Right lateral flexion Limited 75%    Left lateral flexion Limited 75%    Right rotation Limited 50% 75%  75%   Left rotation Limited 50% 75% mild pain 75% mild pain   (Blank rows = not tested)  LOWER EXTREMITY ROM:     Active  Right eval Left eval Right 06/07/23 Left 06/07/23 Left 06/27/23  Hip flexion 50 deg  50 Deg p! 115 115 114 deg painless  Hip extension       Hip abduction       Hip adduction       Hip internal rotation 30 21 p!     Hip external rotation 35 29 p!     Knee flexion       Knee extension       Ankle dorsiflexion       Ankle plantarflexion       Ankle inversion       Ankle eversion        (Blank rows = not tested)  LOWER EXTREMITY MMT:   Not tested due to pt in 7-9/10 pain  MMT Right eval Left eval  Hip flexion    Hip extension    Hip abduction    Hip adduction    Hip internal rotation    Hip external rotation    Knee flexion    Knee extension    Ankle dorsiflexion    Ankle plantarflexion    Ankle inversion    Ankle eversion     (Blank rows = not tested)  LUMBAR SPECIAL TESTS:  Straight leg raise test: Negative and Slump test: Positive Slump test 05/22/23 negative bilaterally  06/20/23 negative slump  FUNCTIONAL TESTS:  5 times sit to stand: 45.3 sec 06/07/23: 22.6 sec hands on thighs    06/27/23: - 5xSTS 18sec with gentle UE support on thighs  GAIT: Distance  walked: 150 Assistive device utilized: Single point cane Level of assistance: Modified independence Comments: Pt with slow cadenced and  R lean  TODAY'S TREATMENT:                                                                                                                              [facility] Adult PT Treatment:  DATE: 06/27/23 Therapeutic Exercise: Standing march x8 BIL SKTC 3x30sec BIL Hip abduction seated, green band x10 HEP handout + education  Therapeutic Activity: MSK assessment + education 5xSTS + education Stair assessment + education (8 steps with rail and SPC, reciprocal pattern initially with ascending but switches to step to pattern with fatigue) Education/discussion re: progress with PT, symptom behavior as it affects activity tolerance, PT goals/POC, safety w/ mobility and gradual progression of activities within pt tolerance, follow up with provider, strategies to safely maximize progress post d/c      PATIENT EDUCATION:  Education details: rationale for interventions, HEP, discharge education, follow up with provider Person educated: Patient Education method: Explanation, Demonstration, Tactile cues, Verbal cues, and Handouts Education comprehension: verbalized understanding, returned demonstration, tactile cues required, and needs further education  HOME EXERCISE PROGRAM: Access Code: [email or web address] URL: [email or web address] Date: 06/27/2023 Prepared by: Fransisco Hertz  Exercises - Seated March  - 2 x daily - 7 x weekly - 1 sets - 10 reps - Supine Hip Adduction Isometric with Ball  - 2 x daily - 7 x weekly - 1 sets - 10 reps - Seated Hip Abduction with Resistance  - 2 x daily - 7 x weekly - 1 sets - 10 reps - Seated Flexion Stretch  - 2 x daily - 7 x weekly - 1 sets - 10 reps - Standing March with Counter Support  - 2 x daily - 7 x weekly - 1 sets - 10 reps - Hooklying Single Knee to Chest Stretch  - 2 x  daily - 7 x weekly - 1 sets - 2 reps - 30sec hold   ASSESSMENT:  CLINICAL IMPRESSION: 06/27/2023 Pt arrives w/ 5/10 pain, states she would like to discharge today to focus on ongoing medical issues. Notes improved activity tolerance compared to start of care, ambulating at home w/o [facility] and in community with [facility], navigating stairs with increased time/effort. Pt has met majority of goals as noted below in goal section, but continues with mild lumbar mobility deficits and pain with increased time in WB. Pt politely declines full HEP performance in clinic today but does well with exercises performed as above, verbalizes good understanding of home performance. Education as above, no adverse events throughout. At this time, recommend discharge to independent HEP and follow up with provider; pt is encouraged to seek referral to re-address this problem if needed/appropriate once her upcoming medical work up is completed. She verbalizes agreement/understanding with plan. Pt departs today's session in no acute distress, all voiced questions/concerns addressed appropriately from PT perspective.     OBJECTIVE IMPAIRMENTS: decreased activity tolerance, decreased mobility, difficulty walking, decreased ROM, decreased strength, postural dysfunction, and pain.   ACTIVITY LIMITATIONS: carrying, lifting, bending, sitting, standing, squatting, transfers, bathing, reach over head, and locomotion level  PARTICIPATION LIMITATIONS: meal prep, cleaning, laundry, driving, and shopping  PERSONAL FACTORS: Osteopenia, tobacco abuse, basal cell carcinoma face, diverticulosis, emphysema, HTN Hyperlipidemia, hysterectomy. See Med hx for full history COPD,  are also affecting patient's functional outcome.   REHAB POTENTIAL: Fair    CLINICAL DECISION MAKING: Unstable/unpredictable  EVALUATION COMPLEXITY: Moderate   GOALS: Goals reviewed with patient? No  SHORT TERM GOALS: Target date: 06-06-23 Pt will be independent with  initial HEP Baseline:limited knowledge 06/05/23: good HEP adherence reported 06/27/23: good adherence Goal status: MET  2.  5 x STS improves to at least 30 sec Baseline: 45.3 eval 06/07/23: 22.6 sec Goal status: MET  3.  Pt will be able to stand with  upright posture and decreased pain to at least 5/10 Baseline: unable to stand upright and pain at 9/10 06/05/23: 6/10 today 06/26/24: 5/10 standing Goal status: MET  4.  Pt will be able to verbalize 3 ways to decrease pain using heat cold /modalities at home for pain control Baseline: no knowledge 06/18/23: Trial of HMP in clinic 06/27/23: rest/pacing for easing symptoms - not using modalities or medication for pain control Goal status: NOT MET    LONG TERM GOALS: Target date: 06-27-23  Pt will be independent with advanced HEP Baseline: limited knowledge 06/27/23: good performance  Goal status: MET   2.  5 x STS imrproves to at least 20 sec to show increased LE strength and decrease risk of fall Baseline: STS 45.3.sec 06/27/23: 18sec  Goal status: MET   3.  Pt will be able to bend and lift to perform household dutes with proper form with increase in pain Baseline: Unable to bend forward more than 25 % 06/27/23: 100% with mild pain  Goal status: MET   4.  Pt will be able to ambulate I without AD  Baseline: reliant on SPC at eval and limited walking ability 06/27/23: walking in home and in clinic without AD  Goal status: MET   5.  FOTO will improve from 31%   to 52%     indicating improved functional mobility Baseline: 31% eval 06/27/23: 55% 06/11/23    Goal status: MET  6.  Pt will be able to negotiate steps with pain no greater than 3/10 Baseline: avoiding steps 06/27/23: going downstairs to do laundry, using rail, able to do 8 steps in clinic without significant increase in pain  Goal status: MET   PLAN: DISCHARGE 06/27/23  PT FREQUENCY: NA  PT DURATION: NA  PLANNED INTERVENTIONS: Therapeutic exercises, Therapeutic activity,  Neuromuscular re-education, Balance training, Gait training, Patient/Family education, Self Care, Joint mobilization, Stair training, Dry Needling, Electrical stimulation, Spinal mobilization, Cryotherapy, Moist heat, Taping, Ionotophoresis 4mg /ml Dexamethasone, Manual therapy, and Re-evaluation.  PLAN FOR NEXT SESSION: discharge to independent HEP, follow up with provider   Ashley Murrain PT, DPT 06/27/2023 4:08 PM

## 2023-06-26 NOTE — H&P (View-Only) (Signed)
 Subjective:    [name], female    DOB: [birth year], 77 y.o.   MRN: [id number]  HPI 77 year old former smoker (120 pack years) followed by Dr. Everardo All in our office.  She has a history of COPD, MGUS, hypertension, stage Ia granulosa cell tumor (ovarian), diverticular disease.  She has a 2.8 cm right upper lobe nodule that was found on screening CT chest 04/29/2023.  This prompted PET scan as below. She reports that her breathing has been doing well. She is compliant w Stiolto. Uses albuterol about 2-3x a day. No cough.   PET scan 06/10/2023 reviewed by me, shows hypermetabolism in the mixed attenuation right upper lobe mass consistent with primary bronchogenic carcinoma.  There is a partially solid partially cystic 12 cm anterior pelvic mass that appears to arise from an ovary, question mucinous carcinoma.  There is also hypermetabolism corresponding with a soft tissue fullness at the pancreatic head and descending duodenum of unclear etiology.   Review of Systems As per HPI  Past Medical History:  Diagnosis Date   Abnormal PFT 09/02/2014   moderate restrictive ventilatory defect.   BCC (basal cell carcinoma), face 2010   Dr. Terri Piedra   Colon polyp 12/2003   Diverticulosis 11/2010   seen on colonoscopy and air contrast BE   Emphysema of lung (HCC)    Hyperlipidemia    Hypertension    previously treated with meds, resolved   MGUS (monoclonal gammopathy of unknown significance) 1999   prevously under care of Dr. 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   Tobacco abuse      Family History  Problem Relation Age of Onset   Stroke Mother    Cerebral aneurysm Mother    Hypertension Father    Suicidality Father    Diabetes Maternal Aunt    Diabetes Maternal Aunt    Diabetes Maternal Aunt    Throat cancer Maternal Uncle     Breast cancer Maternal Aunt        2 aunts   Lung cancer Maternal Uncle      Social History   Socioeconomic History   Marital status: Married    Spouse name: Not on file   Number of children: 1   Years of education: Not on file   Highest education level: Not on file  Occupational History   Occupation: retired Warehouse manager company)    Employer: RETIRED  Tobacco Use   Smoking status: Former    Current packs/day: 0.00    Average packs/day: 2.0 packs/day for 77 years (119.6 ttl pk-yrs)    Types: Cigarettes    Start date: 50    Quit date: 09/10/2018    Years since quitting: 4.7   Smokeless tobacco: Never  Vaping Use   Vaping status: Never Used  Substance and Sexual Activity   Alcohol use: Not Currently   Drug use: No   Sexual activity: Yes    Partners: Male  Other Topics Concern   Not on file  Social History Narrative   Lives with husband, 1 cat.  Son lives with him during the week, stays with his girlfriend on the weekends (divorced).   1 grandson.  Expecting a great-grandbaby      Updated 04/2023   Social Determinants of Health   Financial Resource Strain:  Not on file  Food Insecurity: No Food Insecurity (05/23/2023)   Hunger Vital Sign    Worried About Running Out of Food in the Last Year: Never true    Ran Out of Food in the Last Year: Never true  Transportation Needs: No Transportation Needs (05/23/2023)   PRAPARE - Administrator, Civil Service (Medical): No    Lack of Transportation (Non-Medical): No  Physical Activity: Not on file  Stress: Not on file  Social Connections: Not on file  Intimate Partner Violence: Not At Risk (05/23/2023)   Humiliation, Afraid, Rape, and Kick questionnaire    Fear of Current or Ex-Partner: No    Emotionally Abused: No    Physically Abused: No    Sexually Abused: No     No Known Allergies   Outpatient Medications Prior to Visit  Medication Sig Dispense Refill   albuterol (VENTOLIN HFA) 108 (90 Base)  MCG/ACT inhaler Inhale 2 puffs into the lungs every 6 (six) hours as needed for wheezing or shortness of breath. 8 g 6   cholecalciferol (VITAMIN D3) 25 MCG (1000 UNIT) tablet Take 1,000 Units by mouth daily.     Coenzyme Q10 (COQ10) 100 MG CAPS Take 1 capsule by mouth daily.     losartan (COZAAR) 50 MG tablet Take 1 tablet (50 mg total) by mouth daily. 90 tablet 3   rosuvastatin (CRESTOR) 10 MG tablet Take 1 tablet (10 mg total) by mouth daily. 90 tablet 3   Tiotropium Bromide-Olodaterol (STIOLTO RESPIMAT) 2.5-2.5 MCG/ACT AERS Inhale 2 puffs into the lungs daily. 4 g 11   No facility-administered medications prior to visit.        Objective:   Physical Exam Vitals:   06/26/23 1605  BP: 132/74  Pulse: 66  Temp: 98.3 F (36.8 C)  TempSrc: Oral  SpO2: 94%  Weight: 148 lb 6.4 oz (67.3 kg)  Height: 5\' 7"  (1.702 m)   Gen: Pleasant, well-nourished, in no distress,  normal affect  ENT: No lesions,  mouth clear,  oropharynx clear, no postnasal drip  Neck: No JVD, no stridor  Lungs: No use of accessory muscles, no crackles or wheezing on normal respiration, no wheeze on forced expiration  Cardiovascular: RRR, heart sounds normal, no murmur or gallops, no peripheral edema  Musculoskeletal: No deformities, no cyanosis or clubbing  Neuro: alert, awake, non focal  Skin: Warm, no lesions or rash      Assessment & Plan:  Abnormal positron emission tomography (PET) scan Her right upper lobe mixed density mass has hypermetabolism.  There is also evidence of a pelvic mass, soft tissue fullness at the pancreatic head, both of unclear etiology.  She will probably need multiple biopsies.  I will work on arranging navigational bronchoscopy to evaluate the right upper lobe mass.  I will also refer her to GI.  She has appointments already set with Dr. Bertis Ruddy and Dr. Pricilla Holm to complete the evaluation  We reviewed your CT scan of the chest and your PET scan today. We will arrange for robotic  assisted navigational bronchoscopy to evaluate a right upper lobe mass.  This will be done under general anesthesia as an outpatient at [facility] endoscopy.  You will need a designated driver and someone to watch you at home for the day after the procedure.  We will try to get this scheduled for 07/09/2023. We will arrange for a repeat CT scan of your chest to be used during the procedure. We will make a gastroenterology  referral to discuss and evaluate possible pancreatic mass Follow with Dr. Bertis Ruddy and Dr. Pricilla Holm as planned Follow with Dr. Delton Coombes in 1 month or next available   Levy Pupa, MD, PhD 06/26/2023, 4:44 PM [facility] [phone] or if no answer before 7:00PM call [phone] For any issues after 7:00PM please call eLink [phone]

## 2023-06-26 NOTE — Progress Notes (Signed)
Subjective:    [name], female    DOB: [birth year], 77 y.o.   MRN: [id number]  HPI 77 year old former smoker (120 pack years) followed by Dr. Everardo All in our office.  She has a history of COPD, MGUS, hypertension, stage Ia granulosa cell tumor (ovarian), diverticular disease.  She has a 2.8 cm right upper lobe nodule that was found on screening CT chest 04/29/2023.  This prompted PET scan as below. She reports that her breathing has been doing well. She is compliant w Stiolto. Uses albuterol about 2-3x a day. No cough.   PET scan 06/10/2023 reviewed by me, shows hypermetabolism in the mixed attenuation right upper lobe mass consistent with primary bronchogenic carcinoma.  There is a partially solid partially cystic 12 cm anterior pelvic mass that appears to arise from an ovary, question mucinous carcinoma.  There is also hypermetabolism corresponding with a soft tissue fullness at the pancreatic head and descending duodenum of unclear etiology.   Review of Systems As per HPI  Past Medical History:  Diagnosis Date   Abnormal PFT 09/02/2014   moderate restrictive ventilatory defect.   BCC (basal cell carcinoma), face 2010   Dr. Terri Piedra   Colon polyp 12/2003   Diverticulosis 11/2010   seen on colonoscopy and air contrast BE   Emphysema of lung (HCC)    Hyperlipidemia    Hypertension    previously treated with meds, resolved   MGUS (monoclonal gammopathy of unknown significance) 1999   prevously under care of Dr. 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   Tobacco abuse      Family History  Problem Relation Age of Onset   Stroke Mother    Cerebral aneurysm Mother    Hypertension Father    Suicidality Father    Diabetes Maternal Aunt    Diabetes Maternal Aunt    Diabetes Maternal Aunt    Throat cancer Maternal Uncle     Breast cancer Maternal Aunt        2 aunts   Lung cancer Maternal Uncle      Social History   Socioeconomic History   Marital status: Married    Spouse name: Not on file   Number of children: 1   Years of education: Not on file   Highest education level: Not on file  Occupational History   Occupation: retired Warehouse manager company)    Employer: RETIRED  Tobacco Use   Smoking status: Former    Current packs/day: 0.00    Average packs/day: 2.0 packs/day for 59.8 years (119.6 ttl pk-yrs)    Types: Cigarettes    Start date: 50    Quit date: 09/10/2018    Years since quitting: 4.7   Smokeless tobacco: Never  Vaping Use   Vaping status: Never Used  Substance and Sexual Activity   Alcohol use: Not Currently   Drug use: No   Sexual activity: Yes    Partners: Male  Other Topics Concern   Not on file  Social History Narrative   Lives with husband, 1 cat.  Son lives with him during the week, stays with his girlfriend on the weekends (divorced).   1 grandson.  Expecting a great-grandbaby      Updated 04/2023   Social Determinants of Health   Financial Resource Strain:  Not on file  Food Insecurity: No Food Insecurity (05/23/2023)   Hunger Vital Sign    Worried About Running Out of Food in the Last Year: Never true    Ran Out of Food in the Last Year: Never true  Transportation Needs: No Transportation Needs (05/23/2023)   PRAPARE - Administrator, Civil Service (Medical): No    Lack of Transportation (Non-Medical): No  Physical Activity: Not on file  Stress: Not on file  Social Connections: Not on file  Intimate Partner Violence: Not At Risk (05/23/2023)   Humiliation, Afraid, Rape, and Kick questionnaire    Fear of Current or Ex-Partner: No    Emotionally Abused: No    Physically Abused: No    Sexually Abused: No     No Known Allergies   Outpatient Medications Prior to Visit  Medication Sig Dispense Refill   albuterol (VENTOLIN HFA) 108 (90 Base)  MCG/ACT inhaler Inhale 2 puffs into the lungs every 6 (six) hours as needed for wheezing or shortness of breath. 8 g 6   cholecalciferol (VITAMIN D3) 25 MCG (1000 UNIT) tablet Take 1,000 Units by mouth daily.     Coenzyme Q10 (COQ10) 100 MG CAPS Take 1 capsule by mouth daily.     losartan (COZAAR) 50 MG tablet Take 1 tablet (50 mg total) by mouth daily. 90 tablet 3   rosuvastatin (CRESTOR) 10 MG tablet Take 1 tablet (10 mg total) by mouth daily. 90 tablet 3   Tiotropium Bromide-Olodaterol (STIOLTO RESPIMAT) 2.5-2.5 MCG/ACT AERS Inhale 2 puffs into the lungs daily. 4 g 11   No facility-administered medications prior to visit.        Objective:   Physical Exam Vitals:   06/26/23 1605  BP: 132/74  Pulse: 66  Temp: 98.3 F (36.8 C)  TempSrc: Oral  SpO2: 94%  Weight: 148 lb 6.4 oz (67.3 kg)  Height: 5\' 7"  (1.702 m)   Gen: Pleasant, well-nourished, in no distress,  normal affect  ENT: No lesions,  mouth clear,  oropharynx clear, no postnasal drip  Neck: No JVD, no stridor  Lungs: No use of accessory muscles, no crackles or wheezing on normal respiration, no wheeze on forced expiration  Cardiovascular: RRR, heart sounds normal, no murmur or gallops, no peripheral edema  Musculoskeletal: No deformities, no cyanosis or clubbing  Neuro: alert, awake, non focal  Skin: Warm, no lesions or rash      Assessment & Plan:  Abnormal positron emission tomography (PET) scan Her right upper lobe mixed density mass has hypermetabolism.  There is also evidence of a pelvic mass, soft tissue fullness at the pancreatic head, both of unclear etiology.  She will probably need multiple biopsies.  I will work on arranging navigational bronchoscopy to evaluate the right upper lobe mass.  I will also refer her to GI.  She has appointments already set with Dr. Bertis Ruddy and Dr. Pricilla Holm to complete the evaluation  We reviewed your CT scan of the chest and your PET scan today. We will arrange for robotic  assisted navigational bronchoscopy to evaluate a right upper lobe mass.  This will be done under general anesthesia as an outpatient at [facility] endoscopy.  You will need a designated driver and someone to watch you at home for the day after the procedure.  We will try to get this scheduled for 07/09/2023. We will arrange for a repeat CT scan of your chest to be used during the procedure. We will make a gastroenterology  referral to discuss and evaluate possible pancreatic mass Follow with Dr. Bertis Ruddy and Dr. Pricilla Holm as planned Follow with Dr. Delton Coombes in 1 month or next available   Levy Pupa, MD, PhD 06/26/2023, 4:44 PM [facility] [phone] or if no answer before 7:00PM call [phone] For any issues after 7:00PM please call eLink [phone]

## 2023-06-26 NOTE — Assessment & Plan Note (Signed)
Her right upper lobe mixed density mass has hypermetabolism.  There is also evidence of a pelvic mass, soft tissue fullness at the pancreatic head, both of unclear etiology.  She will probably need multiple biopsies.  I will work on arranging navigational bronchoscopy to evaluate the right upper lobe mass.  I will also refer her to GI.  She has appointments already set with Dr. Bertis Ruddy and Dr. Pricilla Holm to complete the evaluation  We reviewed your CT scan of the chest and your PET scan today. We will arrange for robotic assisted navigational bronchoscopy to evaluate a right upper lobe mass.  This will be done under general anesthesia as an outpatient at [facility] endoscopy.  You will need a designated driver and someone to watch you at home for the day after the procedure.  We will try to get this scheduled for 07/09/2023. We will arrange for a repeat CT scan of your chest to be used during the procedure. We will make a gastroenterology referral to discuss and evaluate possible pancreatic mass Follow with Dr. Bertis Ruddy and Dr. Pricilla Holm as planned Follow with Dr. Delton Coombes in 1 month or next available

## 2023-06-26 NOTE — Patient Instructions (Signed)
We reviewed your CT scan of the chest and your PET scan today. We will arrange for robotic assisted navigational bronchoscopy to evaluate a right upper lobe mass.  This will be done under general anesthesia as an outpatient at [facility] endoscopy.  You will need a designated driver and someone to watch you at home for the day after the procedure.  We will try to get this scheduled for 07/09/2023. We will arrange for a repeat CT scan of your chest to be used during the procedure. We will make a gastroenterology referral to discuss and evaluate possible pancreatic mass Follow with Dr. Bertis Ruddy and Dr. Pricilla Holm as planned Follow with Dr. Delton Coombes in 1 month or next available

## 2023-06-27 ENCOUNTER — Ambulatory Visit: Payer: Medicare Other | Attending: Family Medicine | Admitting: Physical Therapy

## 2023-06-27 ENCOUNTER — Encounter: Payer: Self-pay | Admitting: Physical Therapy

## 2023-06-27 DIAGNOSIS — M6281 Muscle weakness (generalized): Secondary | ICD-10-CM | POA: Insufficient documentation

## 2023-06-27 DIAGNOSIS — M5442 Lumbago with sciatica, left side: Secondary | ICD-10-CM | POA: Insufficient documentation

## 2023-06-27 DIAGNOSIS — G8929 Other chronic pain: Secondary | ICD-10-CM | POA: Diagnosis not present

## 2023-06-27 DIAGNOSIS — R293 Abnormal posture: Secondary | ICD-10-CM | POA: Insufficient documentation

## 2023-06-27 DIAGNOSIS — M25552 Pain in left hip: Secondary | ICD-10-CM | POA: Insufficient documentation

## 2023-07-01 ENCOUNTER — Ambulatory Visit (INDEPENDENT_AMBULATORY_CARE_PROVIDER_SITE_OTHER): Payer: Medicare Other | Admitting: Family Medicine

## 2023-07-01 ENCOUNTER — Encounter: Payer: Self-pay | Admitting: Family Medicine

## 2023-07-01 VITALS — BP 120/68 | HR 84 | Temp 98.4°F | Ht 67.0 in | Wt 147.0 lb

## 2023-07-01 DIAGNOSIS — R300 Dysuria: Secondary | ICD-10-CM

## 2023-07-01 DIAGNOSIS — C3490 Malignant neoplasm of unspecified part of unspecified bronchus or lung: Secondary | ICD-10-CM | POA: Diagnosis not present

## 2023-07-01 DIAGNOSIS — R1900 Intra-abdominal and pelvic swelling, mass and lump, unspecified site: Secondary | ICD-10-CM

## 2023-07-01 DIAGNOSIS — N3000 Acute cystitis without hematuria: Secondary | ICD-10-CM

## 2023-07-01 LAB — POCT URINALYSIS DIP (PROADVANTAGE DEVICE)
Bilirubin, UA: NEGATIVE
Glucose, UA: NEGATIVE mg/dL
Ketones, POC UA: NEGATIVE mg/dL
Nitrite, UA: NEGATIVE
Protein Ur, POC: NEGATIVE mg/dL
Specific Gravity, Urine: 1.01
Urobilinogen, Ur: 0.2
pH, UA: 6 (ref 5.0–8.0)

## 2023-07-01 MED ORDER — NITROFURANTOIN MONOHYD MACRO 100 MG PO CAPS
100.0000 mg | ORAL_CAPSULE | Freq: Two times a day (BID) | ORAL | 0 refills | Status: DC
Start: 2023-07-01 — End: 2023-07-19

## 2023-07-01 NOTE — Progress Notes (Signed)
Chief Complaint  Patient presents with   Urinary Frequency    Started last week, mid week with urinary frequency and having to get up in the middle of the night (which she never does). Painful urination and also has a bad smell.    Around Wednesday of last week she noticed an odor to her urine. She also noticed pain with urination. Symptoms persist--dysuria, frequency (having to get up once a night, which is new for her), voiding just small amounts. No hematuria. No abdominal pain or flank pain. Denies vaginal discharge or bleeding.  H/o UTI in the past, not for many years. Feels similar.  She has a lot going on this week.  She is scheduled for bronchoscopy next week, as well as CT scan, and has appointments with oncologist and GYN visits later this week.  PET scan in 05/2023: IMPRESSION: 1. Mixed attenuation right upper lobe lung mass is hypermetabolic, most consistent with primary bronchogenic carcinoma. 2. A partially solid, partially cystic 12.4 cm anterior pelvic mass most likely arises from an ovary and is suspicious for mucinous carcinoma. Consider gynecological consultation. 3. Hypermetabolism corresponding to soft tissue fullness about the pancreatic head and descending duodenum. Considerations include pancreatic head carcinoma or focal pancreatitis. Consider further evaluation with pancreatic protocol pre and post contrast abdominal CT or MRI.       PMH, PSH, SH reviewed  Outpatient Encounter Medications as of 07/01/2023  Medication Sig Note   cholecalciferol (VITAMIN D3) 25 MCG (1000 UNIT) tablet Take 1,000 Units by mouth daily.    Coenzyme Q10 (COQ10) 100 MG CAPS Take 1 capsule by mouth daily.    losartan (COZAAR) 50 MG tablet Take 1 tablet (50 mg total) by mouth daily.    rosuvastatin (CRESTOR) 10 MG tablet Take 1 tablet (10 mg total) by mouth daily.    Tiotropium Bromide-Olodaterol (STIOLTO RESPIMAT) 2.5-2.5 MCG/ACT AERS Inhale 2 puffs into the lungs daily.  07/01/2023: Uses in the evening   albuterol (VENTOLIN HFA) 108 (90 Base) MCG/ACT inhaler Inhale 2 puffs into the lungs every 6 (six) hours as needed for wheezing or shortness of breath. (Patient not taking: Reported on 07/01/2023) 07/01/2023: As needed   No facility-administered encounter medications on file as of 07/01/2023.   No Known Allergies   ROS: No f/c, n/v, flank or abdominal pain. Urinary urgency, frequency and dysuria per HPI. Back pain improved with PT.  Has home exercises. Breathing at baseline, occ tickle which causes a cough. No significant cough or shortness of breath.   PHYSICAL EXAM:  BP 120/68   Pulse 84   Temp 98.4 F (36.9 C) (Tympanic)   Ht 5\' 7"  (1.702 m)   Wt 147 lb (66.7 kg)   BMI 23.02 kg/m   Well-appearing, pleasant female, in good spirits HEENT: conjunctiva and sclera are clear, EOMI Heart: regular rate and rhythm Lungs: fairly clear, some decreased air movement, unchanged Back: no CVA tenderness Abdomen: soft, nontender Extremities: no edema Psych: normal mood, affect, hygiene and grooming Neuro: alert and oriented. Ambulates with cane, normal strength Some discomfort in her back as she laid down on the table.  Urine: trace blood, 3+ leuks, nitrite negative    ASSESSMENT/PLAN:  Acute cystitis without hematuria - Plan: CULTURE, URINE COMPREHENSIVE, nitrofurantoin, macrocrystal-monohydrate, (MACROBID) 100 MG capsule  Burning with urination - Plan: POCT Urinalysis DIP (Proadvantage Device)  Pelvic mass - noted on recent PET scan. Has appt with GYN-Onc this week. 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  Bronchoscopy is scheduled for next week

## 2023-07-02 ENCOUNTER — Encounter: Payer: Self-pay | Admitting: Gynecologic Oncology

## 2023-07-03 ENCOUNTER — Ambulatory Visit (HOSPITAL_COMMUNITY)
Admission: RE | Admit: 2023-07-03 | Discharge: 2023-07-03 | Disposition: A | Discharge status: Home / Self Care | Payer: Medicare Other | Source: Ambulatory Visit | Attending: Family Medicine | Admitting: Family Medicine

## 2023-07-03 ENCOUNTER — Ambulatory Visit (HOSPITAL_BASED_OUTPATIENT_CLINIC_OR_DEPARTMENT_OTHER): Payer: Medicare Other | Admitting: Pulmonary Disease

## 2023-07-03 DIAGNOSIS — I700 Atherosclerosis of aorta: Secondary | ICD-10-CM | POA: Diagnosis not present

## 2023-07-03 DIAGNOSIS — R918 Other nonspecific abnormal finding of lung field: Secondary | ICD-10-CM | POA: Insufficient documentation

## 2023-07-03 DIAGNOSIS — J432 Centrilobular emphysema: Secondary | ICD-10-CM | POA: Diagnosis not present

## 2023-07-04 ENCOUNTER — Telehealth: Payer: Self-pay | Admitting: Oncology

## 2023-07-04 ENCOUNTER — Inpatient Hospital Stay: Payer: Medicare Other | Admitting: Hematology and Oncology

## 2023-07-04 NOTE — H&P (View-Only) (Signed)
 GYNECOLOGIC ONCOLOGY NEW PATIENT CONSULTATION   Patient Name: Tiffany [name]  Patient Age: 77 y.o. Date of Service: 07/05/23 Referring Provider: Dr. Luciano Cutter  Primary Care Provider: Joselyn Arrow, MD Consulting Provider: Eugene Garnet, MD   Assessment/Plan:  Postmenopausal patient with remote history of granulosa cell tumor of the ovary now with an FDG avid mass concerning for granulosa cell tumor recurrence.  Also with a solitary FDG avid pulmonary mass in the setting of a long history of tobacco use and COPD, FDG avid pancreatic mass.  The patient and I reviewed her remote history of granulosa cell tumor.  We looked at her recent imaging.  I discussed that the mass in her pelvis is mostly cystic although has some FDG avid rim enhancement.  Given her history, I suspect that this is a recurrence of her granulosa cell tumor.  Inhibin B was a marker for her at the time of diagnosis.  Will plan to get this as well as AMH, CA125, and CEA.  Given long interval (almost 20 years) since her diagnosis, which is not infrequent with this type of tumor, I recommend proceeding with surgical excision.  We may be able to avoid any need for systemic therapy although will discuss this further once we have pathology results.  We also discussed pancreatic and pulmonary lesions.  It is rare for granulosa cell tumor to metastasized to the lungs.  I think that these may represent separate pathology.  The patient is scheduled for a biopsy of her lung lesion next week.  She has already been referred to gastroenterology.  I will ask my nurse navigator to call to see if she can help expedite getting the patient scheduled for a consultation.  Based on my exam, I am concerned that the mass may be somewhat adherent to the bladder and anterior abdominal wall.  I also cannot tell whether it is separate from the rectum.  Given this, we discussed need for bowel prep prior to surgery.  Will plan on an antibiotic bowel  prep and a gentle mechanical prep.  Plan will be to start with a diagnostic laparoscopy.  If feasible, we will excise the mass robotically.  If the mass is too adherent or she has scar tissue from prior surgery, we will plan to excise the mass through a midline laparotomy.  We reviewed the plan for diagnostic laparoscopy, possible robotic versus open pelvic mass excision, possible staging, possible bowel surgery, and any other indicated procedures. The risks of surgery were discussed in detail and she understands these to include infection; wound separation; hernia; vaginal cuff separation, injury to adjacent organs such as bowel, bladder, blood vessels, ureters and nerves; bleeding which may require blood transfusion; anesthesia risk; thromboembolic events; possible death; unforeseen complications; possible need for re-exploration; medical complications such as heart attack, stroke, pleural effusion and pneumonia. The patient will receive DVT and antibiotic prophylaxis as indicated. She voiced a clear understanding. She had the opportunity to ask questions. Perioperative instructions were reviewed with her. Prescriptions for post-op medications were sent to her pharmacy of choice.  The patient may require 2 versus 4 weeks of anticoagulation depending on diagnosis and modality of surgery.  A copy of this note was sent to the patient's referring provider.   65 minutes of total time was spent for this patient encounter, including preparation, face-to-face counseling with the patient and coordination of care, and documentation of the encounter.  Eugene Garnet, MD  Division of Gynecologic Oncology  Department of Obstetrics  and Gynecology  [facility]  ___________________________________________  Chief Complaint: Chief Complaint  Patient presents with   Pelvic mass    History of Present Illness:  Tiffany [name] is a 77 y.o. y.o. female who is seen in consultation at  the request of Dr. Everardo All for an evaluation of a pelvic mass.  The patient had a recent CT scan in the setting of her COPD and lung cancer screening.  CT of the chest in early June showed a new part solid masslike lesion in the posterior segment right upper lobe concerning for possible malignancy.  PET scan was performed on 7/15 and shows a mixed attenuation right upper lobe lung mass that is hypermetabolic, most consistent with primary bronchogenic carcinoma.  Incidental finding of a 12.4 cm partially solid, partially cystic mass in the anterior pelvis suspected to be arising from the ovary.  There is hypermetabolism cups corresponding to soft tissue fullness about the pancreatic head and descending duodenum, with differential diagnosis including pancreatic head carcinoma or focal pancreatitis.  The patient saw Dr. Delton Coombes on 7/31.  She is going to be scheduled for navigational bronchoscopy (scheduled on 8/13) to evaluate her right upper lobe mass.  Referral was sent to GI in the setting of her pancreatic hypermetabolism.  Her history is notable for stage IA granulosa cell tumor diagnosed in 2007.  At that time, she underwent ex lap, bilateral salpingo-oophorectomy, pelvic lymphadenectomy, and omentectomy with Dr. Stanford Breed.  Pathology revealed a 23 cm adult granulosa cell tumor of the left ovary with a benign fallopian tube.  Right tube and ovary were benign.  No involved pelvic lymph nodes.  Small bowel adhesions were submitted and negative for evidence of malignancy.  Inhibin levels at the time of her diagnosis were elevated to 77.  She was last seen in 2012 in our clinic and remained NED.  The patient reports overall doing well.  She began having urinary tract symptoms about 2 weeks ago including dysuria.  She is now on nitrofurantoin with improving symptoms.  She denies any vaginal bleeding.  She denies any pelvic pressure or pain.  She reports normal bowel function.  She endorses a good appetite  without nausea or emesis.  She endorses about 10 pounds of unintentional weight loss in the last 6 months.  She walks with a cane for balance reasons after a fall where she hurt her left low back and hip.  She had been doing physical therapy but has put this on hold given ongoing workup for multiple issues.  PAST MEDICAL HISTORY:  Past Medical History:  Diagnosis Date   Abnormal PFT 09/02/2014   moderate restrictive ventilatory defect.   BCC (basal cell carcinoma), face 2010   Dr. Terri Piedra   Colon polyp 12/2003   Diverticulosis 11/2010   seen on colonoscopy and air contrast BE   Emphysema of lung (HCC)    Hyperlipidemia    Hypertension    previously treated with meds, resolved   MGUS (monoclonal gammopathy of unknown significance) 1999   prevously under care of Dr. 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 Laural Benes. Diverticulosis. BE also done--normal   OMENTECTOMY  11/26/2005   and pelvic lymphadenectomy (with BSO)   SKIN CANCER EXCISION Left 07/2021   neck; pt can't recall type of cancer   TONSILLECTOMY  age 37   VAGINAL HYSTERECTOMY  late 20's   "didn't want any more children"    OB/GYN HISTORY:  OB History  Gravida Para Term Preterm AB Living  1 1       1   SAB IAB Ectopic Multiple Live Births               # Outcome Date GA Lbr Len/2nd Weight Sex Type Anes PTL Lv  1 Para             No LMP recorded. Patient has had a hysterectomy.  Age at menarche: 89 Age at menopause: In her 30s Hx of HRT:  Denies Hx of STDs: Denies Last pap: 2023 per her report History of abnormal pap smears: Denies  SCREENING STUDIES:  Last mammogram: 2022  Last colonoscopy: 2012  MEDICATIONS: Outpatient Encounter Medications as of 07/05/2023  Medication Sig   albuterol (VENTOLIN HFA) 108 (90 Base) MCG/ACT inhaler Inhale 2 puffs into the lungs every 6 (six) hours as needed for wheezing or shortness of breath.   cholecalciferol (VITAMIN D3) 25 MCG (1000 UNIT) tablet Take 1,000 Units by mouth daily.   Coenzyme Q10 (COQ10) 100 MG CAPS Take 1 capsule by mouth daily.   losartan (COZAAR) 50 MG tablet Take 1 tablet (50 mg total) by mouth daily.   nitrofurantoin, macrocrystal-monohydrate, (MACROBID) 100 MG capsule Take 1 capsule (100 mg total) by mouth 2 (two) times daily.   rosuvastatin (CRESTOR) 10 MG tablet Take 1 tablet (10 mg total) by mouth daily.   Tiotropium Bromide-Olodaterol (STIOLTO RESPIMAT) 2.5-2.5 MCG/ACT AERS Inhale 2 puffs into the lungs daily.   No facility-administered encounter medications on file as of 07/05/2023.    ALLERGIES:  No Known Allergies   FAMILY HISTORY:  Family History  Problem Relation Age of Onset   Stroke Mother    Cerebral aneurysm Mother    Hypertension Father    Suicidality Father    Diabetes Maternal Aunt    Diabetes Maternal Aunt    Breast cancer Maternal Aunt    Diabetes Maternal Aunt    Breast cancer Maternal Aunt        2 aunts   Throat cancer Maternal Uncle    Lung cancer Maternal Uncle    Prostate cancer Paternal Uncle    Colon cancer Neg Hx    Ovarian cancer Neg Hx    Endometrial cancer Neg Hx    Pancreatic cancer Neg Hx      SOCIAL HISTORY:  Social Connections: Not on file    REVIEW OF SYSTEMS:  + Shortness of breath, back pain, dysuria Denies appetite changes, fevers, chills, fatigue, unexplained weight changes. Denies hearing loss, neck lumps or masses, mouth sores, ringing in ears or voice changes. Denies cough or wheezing.   Denies chest  pain or palpitations. Denies leg swelling. Denies abdominal distention, pain, blood in stools, constipation, diarrhea, nausea, vomiting, or early satiety. Denies pain with intercourse, frequency, hematuria or incontinence. Denies hot flashes, pelvic pain, vaginal bleeding or vaginal discharge.   Denies joint pain or muscle pain/cramps. Denies itching, rash, or wounds. Denies  dizziness, headaches, numbness or seizures. Denies swollen lymph nodes or glands, denies easy bruising or bleeding. Denies anxiety, depression, confusion, or decreased concentration.  Physical Exam:  Vital Signs for this encounter:  Blood pressure (!) 148/77, pulse 84, temperature 98.1 F (36.7 C), resp. rate 18, weight 147 lb (66.7 kg), SpO2 95%. Body mass index is 23.02 kg/m. General: Alert, oriented, no acute distress.  HEENT: Normocephalic, atraumatic. Sclera anicteric.  Chest: Mild expiratory wheezing, otherwise clear to auscultation bilaterally. No  rhonchi, or rales. Cardiovascular: Regular rate and rhythm, no murmurs, rubs, or gallops.  Abdomen: Normoactive bowel sounds. Soft, nondistended, nontender to palpation. No hepatosplenomegaly appreciated. No palpable fluid wave.  Mass palpated in the low anterior pelvis, better on pelvic exam. Extremities: Grossly normal range of motion. Warm, well perfused. No edema bilaterally.  Skin: No rashes or lesions.  Lymphatics: No cervical, supraclavicular, or inguinal adenopathy.  GU:  Normal external female genitalia.   No lesions. No discharge or bleeding.             Bladder/urethra:  No lesions or masses, well supported bladder             Vagina: Palpably normal.             Cervix/uterus: Surgically absent.             Adnexa: Surgically absent.  Within the mid pelvis spanning 4-6 cm below the umbilicus there is a mass with moderate mobility, best appreciated on vaginal exam although can be palpated on rectovaginal exam as well.  Difficult to tell if this is adherent  to the rectum.  It feels as though it may be adherent to the bladder itself and to the anterior abdominal wall.  Rectal: See above.  LABORATORY AND RADIOLOGIC DATA:  Outside medical records were reviewed to synthesize the above history, along with the history and physical obtained during the visit.   Lab Results  Component Value Date   WBC 7.2 05/08/2023   HGB 12.4 05/08/2023   HCT 38.0 05/08/2023   PLT 207 05/08/2023   GLUCOSE 116 (H) 05/08/2023   CHOL 140 05/08/2023   TRIG 77 05/08/2023   HDL 59 05/08/2023   LDLCALC 66 05/08/2023   ALT 13 05/08/2023   AST 19 05/08/2023   NA 137 05/08/2023   K 4.3 05/08/2023   CL 99 05/08/2023   CREATININE 0.70 05/08/2023   BUN 17 05/08/2023   CO2 22 05/08/2023   TSH 3.700 12/03/2018   HGBA1C 6.1 (A) 10/22/2022

## 2023-07-04 NOTE — Telephone Encounter (Signed)
Called Carney Bern about her appointment today with Dr. Bertis Ruddy.  She said she called first thing this morning and left a message to cancel.  Advised her that I will work on getting her rescheduled and she asked to let her know at her appointment with Dr. Pricilla Holm tomorrow.

## 2023-07-04 NOTE — Progress Notes (Signed)
GYNECOLOGIC ONCOLOGY NEW PATIENT CONSULTATION   Patient Name: Tiffany [name]  Patient Age: 77 y.o. Date of Service: 07/05/23 Referring Provider: Dr. Luciano Cutter  Primary Care Provider: Joselyn Arrow, MD Consulting Provider: Eugene Garnet, MD   Assessment/Plan:  Postmenopausal patient with remote history of granulosa cell tumor of the ovary now with an FDG avid mass concerning for granulosa cell tumor recurrence.  Also with a solitary FDG avid pulmonary mass in the setting of a long history of tobacco use and COPD, FDG avid pancreatic mass.  The patient and I reviewed her remote history of granulosa cell tumor.  We looked at her recent imaging.  I discussed that the mass in her pelvis is mostly cystic although has some FDG avid rim enhancement.  Given her history, I suspect that this is a recurrence of her granulosa cell tumor.  Inhibin B was a marker for her at the time of diagnosis.  Will plan to get this as well as AMH, CA125, and CEA.  Given long interval (almost 20 years) since her diagnosis, which is not infrequent with this type of tumor, I recommend proceeding with surgical excision.  We may be able to avoid any need for systemic therapy although will discuss this further once we have pathology results.  We also discussed pancreatic and pulmonary lesions.  It is rare for granulosa cell tumor to metastasized to the lungs.  I think that these may represent separate pathology.  The patient is scheduled for a biopsy of her lung lesion next week.  She has already been referred to gastroenterology.  I will ask my nurse navigator to call to see if she can help expedite getting the patient scheduled for a consultation.  Based on my exam, I am concerned that the mass may be somewhat adherent to the bladder and anterior abdominal wall.  I also cannot tell whether it is separate from the rectum.  Given this, we discussed need for bowel prep prior to surgery.  Will plan on an antibiotic bowel  prep and a gentle mechanical prep.  Plan will be to start with a diagnostic laparoscopy.  If feasible, we will excise the mass robotically.  If the mass is too adherent or she has scar tissue from prior surgery, we will plan to excise the mass through a midline laparotomy.  We reviewed the plan for diagnostic laparoscopy, possible robotic versus open pelvic mass excision, possible staging, possible bowel surgery, and any other indicated procedures. The risks of surgery were discussed in detail and she understands these to include infection; wound separation; hernia; vaginal cuff separation, injury to adjacent organs such as bowel, bladder, blood vessels, ureters and nerves; bleeding which may require blood transfusion; anesthesia risk; thromboembolic events; possible death; unforeseen complications; possible need for re-exploration; medical complications such as heart attack, stroke, pleural effusion and pneumonia. The patient will receive DVT and antibiotic prophylaxis as indicated. She voiced a clear understanding. She had the opportunity to ask questions. Perioperative instructions were reviewed with her. Prescriptions for post-op medications were sent to her pharmacy of choice.  The patient may require 2 versus 4 weeks of anticoagulation depending on diagnosis and modality of surgery.  A copy of this note was sent to the patient's referring provider.   65 minutes of total time was spent for this patient encounter, including preparation, face-to-face counseling with the patient and coordination of care, and documentation of the encounter.  Eugene Garnet, MD  Division of Gynecologic Oncology  Department of Obstetrics  and Gynecology  [facility]  ___________________________________________  Chief Complaint: Chief Complaint  Patient presents with   Pelvic mass    History of Present Illness:  Tiffany [name] is a 77 y.o. y.o. female who is seen in consultation at  the request of Dr. Everardo All for an evaluation of a pelvic mass.  The patient had a recent CT scan in the setting of her COPD and lung cancer screening.  CT of the chest in early June showed a new part solid masslike lesion in the posterior segment right upper lobe concerning for possible malignancy.  PET scan was performed on 7/15 and shows a mixed attenuation right upper lobe lung mass that is hypermetabolic, most consistent with primary bronchogenic carcinoma.  Incidental finding of a 12.4 cm partially solid, partially cystic mass in the anterior pelvis suspected to be arising from the ovary.  There is hypermetabolism cups corresponding to soft tissue fullness about the pancreatic head and descending duodenum, with differential diagnosis including pancreatic head carcinoma or focal pancreatitis.  The patient saw Dr. Delton Coombes on 7/31.  She is going to be scheduled for navigational bronchoscopy (scheduled on 8/13) to evaluate her right upper lobe mass.  Referral was sent to GI in the setting of her pancreatic hypermetabolism.  Her history is notable for stage IA granulosa cell tumor diagnosed in 2007.  At that time, she underwent ex lap, bilateral salpingo-oophorectomy, pelvic lymphadenectomy, and omentectomy with Dr. Stanford Breed.  Pathology revealed a 23 cm adult granulosa cell tumor of the left ovary with a benign fallopian tube.  Right tube and ovary were benign.  No involved pelvic lymph nodes.  Small bowel adhesions were submitted and negative for evidence of malignancy.  Inhibin levels at the time of her diagnosis were elevated to 77.  She was last seen in 2012 in our clinic and remained NED.  The patient reports overall doing well.  She began having urinary tract symptoms about 2 weeks ago including dysuria.  She is now on nitrofurantoin with improving symptoms.  She denies any vaginal bleeding.  She denies any pelvic pressure or pain.  She reports normal bowel function.  She endorses a good appetite  without nausea or emesis.  She endorses about 10 pounds of unintentional weight loss in the last 6 months.  She walks with a cane for balance reasons after a fall where she hurt her left low back and hip.  She had been doing physical therapy but has put this on hold given ongoing workup for multiple issues.  PAST MEDICAL HISTORY:  Past Medical History:  Diagnosis Date   Abnormal PFT 09/02/2014   moderate restrictive ventilatory defect.   BCC (basal cell carcinoma), face 2010   Dr. Terri Piedra   Colon polyp 12/2003   Diverticulosis 11/2010   seen on colonoscopy and air contrast BE   Emphysema of lung (HCC)    Hyperlipidemia    Hypertension    previously treated with meds, resolved   MGUS (monoclonal gammopathy of unknown significance) 1999   prevously under care of Dr. 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 Laural Benes. Diverticulosis. BE also done--normal   OMENTECTOMY  11/26/2005   and pelvic lymphadenectomy (with BSO)   SKIN CANCER EXCISION Left 07/2021   neck; pt can't recall type of cancer   TONSILLECTOMY  age 37   VAGINAL HYSTERECTOMY  late 20's   "didn't want any more children"    OB/GYN HISTORY:  OB History  Gravida Para Term Preterm AB Living  1 1       1   SAB IAB Ectopic Multiple Live Births               # Outcome Date GA Lbr Len/2nd Weight Sex Type Anes PTL Lv  1 Para             No LMP recorded. Patient has had a hysterectomy.  Age at menarche: 89 Age at menopause: In her 30s Hx of HRT:  Denies Hx of STDs: Denies Last pap: 2023 per her report History of abnormal pap smears: Denies  SCREENING STUDIES:  Last mammogram: 2022  Last colonoscopy: 2012  MEDICATIONS: Outpatient Encounter Medications as of 07/05/2023  Medication Sig   albuterol (VENTOLIN HFA) 108 (90 Base) MCG/ACT inhaler Inhale 2 puffs into the lungs every 6 (six) hours as needed for wheezing or shortness of breath.   cholecalciferol (VITAMIN D3) 25 MCG (1000 UNIT) tablet Take 1,000 Units by mouth daily.   Coenzyme Q10 (COQ10) 100 MG CAPS Take 1 capsule by mouth daily.   losartan (COZAAR) 50 MG tablet Take 1 tablet (50 mg total) by mouth daily.   nitrofurantoin, macrocrystal-monohydrate, (MACROBID) 100 MG capsule Take 1 capsule (100 mg total) by mouth 2 (two) times daily.   rosuvastatin (CRESTOR) 10 MG tablet Take 1 tablet (10 mg total) by mouth daily.   Tiotropium Bromide-Olodaterol (STIOLTO RESPIMAT) 2.5-2.5 MCG/ACT AERS Inhale 2 puffs into the lungs daily.   No facility-administered encounter medications on file as of 07/05/2023.    ALLERGIES:  No Known Allergies   FAMILY HISTORY:  Family History  Problem Relation Age of Onset   Stroke Mother    Cerebral aneurysm Mother    Hypertension Father    Suicidality Father    Diabetes Maternal Aunt    Diabetes Maternal Aunt    Breast cancer Maternal Aunt    Diabetes Maternal Aunt    Breast cancer Maternal Aunt        2 aunts   Throat cancer Maternal Uncle    Lung cancer Maternal Uncle    Prostate cancer Paternal Uncle    Colon cancer Neg Hx    Ovarian cancer Neg Hx    Endometrial cancer Neg Hx    Pancreatic cancer Neg Hx      SOCIAL HISTORY:  Social Connections: Not on file    REVIEW OF SYSTEMS:  + Shortness of breath, back pain, dysuria Denies appetite changes, fevers, chills, fatigue, unexplained weight changes. Denies hearing loss, neck lumps or masses, mouth sores, ringing in ears or voice changes. Denies cough or wheezing.   Denies chest  pain or palpitations. Denies leg swelling. Denies abdominal distention, pain, blood in stools, constipation, diarrhea, nausea, vomiting, or early satiety. Denies pain with intercourse, frequency, hematuria or incontinence. Denies hot flashes, pelvic pain, vaginal bleeding or vaginal discharge.   Denies joint pain or muscle pain/cramps. Denies itching, rash, or wounds. Denies  dizziness, headaches, numbness or seizures. Denies swollen lymph nodes or glands, denies easy bruising or bleeding. Denies anxiety, depression, confusion, or decreased concentration.  Physical Exam:  Vital Signs for this encounter:  Blood pressure (!) 148/77, pulse 84, temperature 98.1 F (36.7 C), resp. rate 18, weight 147 lb (66.7 kg), SpO2 95%. Body mass index is 23.02 kg/m. General: Alert, oriented, no acute distress.  HEENT: Normocephalic, atraumatic. Sclera anicteric.  Chest: Mild expiratory wheezing, otherwise clear to auscultation bilaterally. No  rhonchi, or rales. Cardiovascular: Regular rate and rhythm, no murmurs, rubs, or gallops.  Abdomen: Normoactive bowel sounds. Soft, nondistended, nontender to palpation. No hepatosplenomegaly appreciated. No palpable fluid wave.  Mass palpated in the low anterior pelvis, better on pelvic exam. Extremities: Grossly normal range of motion. Warm, well perfused. No edema bilaterally.  Skin: No rashes or lesions.  Lymphatics: No cervical, supraclavicular, or inguinal adenopathy.  GU:  Normal external female genitalia.   No lesions. No discharge or bleeding.             Bladder/urethra:  No lesions or masses, well supported bladder             Vagina: Palpably normal.             Cervix/uterus: Surgically absent.             Adnexa: Surgically absent.  Within the mid pelvis spanning 4-6 cm below the umbilicus there is a mass with moderate mobility, best appreciated on vaginal exam although can be palpated on rectovaginal exam as well.  Difficult to tell if this is adherent  to the rectum.  It feels as though it may be adherent to the bladder itself and to the anterior abdominal wall.  Rectal: See above.  LABORATORY AND RADIOLOGIC DATA:  Outside medical records were reviewed to synthesize the above history, along with the history and physical obtained during the visit.   Lab Results  Component Value Date   WBC 7.2 05/08/2023   HGB 12.4 05/08/2023   HCT 38.0 05/08/2023   PLT 207 05/08/2023   GLUCOSE 116 (H) 05/08/2023   CHOL 140 05/08/2023   TRIG 77 05/08/2023   HDL 59 05/08/2023   LDLCALC 66 05/08/2023   ALT 13 05/08/2023   AST 19 05/08/2023   NA 137 05/08/2023   K 4.3 05/08/2023   CL 99 05/08/2023   CREATININE 0.70 05/08/2023   BUN 17 05/08/2023   CO2 22 05/08/2023   TSH 3.700 12/03/2018   HGBA1C 6.1 (A) 10/22/2022

## 2023-07-05 ENCOUNTER — Inpatient Hospital Stay: Payer: Medicare Other | Attending: Gynecologic Oncology | Admitting: Gynecologic Oncology

## 2023-07-05 ENCOUNTER — Encounter: Payer: Self-pay | Admitting: Gastroenterology

## 2023-07-05 ENCOUNTER — Encounter: Payer: Self-pay | Admitting: Gynecologic Oncology

## 2023-07-05 ENCOUNTER — Inpatient Hospital Stay: Payer: Medicare Other

## 2023-07-05 ENCOUNTER — Other Ambulatory Visit: Payer: Self-pay

## 2023-07-05 ENCOUNTER — Inpatient Hospital Stay (HOSPITAL_BASED_OUTPATIENT_CLINIC_OR_DEPARTMENT_OTHER): Payer: Medicare Other | Admitting: Gynecologic Oncology

## 2023-07-05 ENCOUNTER — Encounter (HOSPITAL_COMMUNITY): Payer: Self-pay | Admitting: Emergency Medicine

## 2023-07-05 VITALS — BP 148/77 | HR 84 | Temp 98.1°F | Resp 18 | Wt 147.0 lb

## 2023-07-05 DIAGNOSIS — M8580 Other specified disorders of bone density and structure, unspecified site: Secondary | ICD-10-CM | POA: Insufficient documentation

## 2023-07-05 DIAGNOSIS — R918 Other nonspecific abnormal finding of lung field: Secondary | ICD-10-CM | POA: Insufficient documentation

## 2023-07-05 DIAGNOSIS — I10 Essential (primary) hypertension: Secondary | ICD-10-CM | POA: Insufficient documentation

## 2023-07-05 DIAGNOSIS — J439 Emphysema, unspecified: Secondary | ICD-10-CM | POA: Diagnosis not present

## 2023-07-05 DIAGNOSIS — Z803 Family history of malignant neoplasm of breast: Secondary | ICD-10-CM | POA: Insufficient documentation

## 2023-07-05 DIAGNOSIS — Z801 Family history of malignant neoplasm of trachea, bronchus and lung: Secondary | ICD-10-CM | POA: Diagnosis not present

## 2023-07-05 DIAGNOSIS — R978 Other abnormal tumor markers: Secondary | ICD-10-CM | POA: Diagnosis not present

## 2023-07-05 DIAGNOSIS — R1900 Intra-abdominal and pelvic swelling, mass and lump, unspecified site: Secondary | ICD-10-CM | POA: Insufficient documentation

## 2023-07-05 DIAGNOSIS — E785 Hyperlipidemia, unspecified: Secondary | ICD-10-CM | POA: Insufficient documentation

## 2023-07-05 DIAGNOSIS — Z86018 Personal history of other benign neoplasm: Secondary | ICD-10-CM | POA: Diagnosis not present

## 2023-07-05 DIAGNOSIS — D3910 Neoplasm of uncertain behavior of unspecified ovary: Secondary | ICD-10-CM

## 2023-07-05 DIAGNOSIS — R634 Abnormal weight loss: Secondary | ICD-10-CM | POA: Insufficient documentation

## 2023-07-05 DIAGNOSIS — Z79899 Other long term (current) drug therapy: Secondary | ICD-10-CM | POA: Diagnosis not present

## 2023-07-05 DIAGNOSIS — J449 Chronic obstructive pulmonary disease, unspecified: Secondary | ICD-10-CM

## 2023-07-05 DIAGNOSIS — Z87891 Personal history of nicotine dependence: Secondary | ICD-10-CM | POA: Insufficient documentation

## 2023-07-05 DIAGNOSIS — K8689 Other specified diseases of pancreas: Secondary | ICD-10-CM

## 2023-07-05 DIAGNOSIS — Z8042 Family history of malignant neoplasm of prostate: Secondary | ICD-10-CM | POA: Insufficient documentation

## 2023-07-05 DIAGNOSIS — D472 Monoclonal gammopathy: Secondary | ICD-10-CM | POA: Insufficient documentation

## 2023-07-05 DIAGNOSIS — R300 Dysuria: Secondary | ICD-10-CM | POA: Insufficient documentation

## 2023-07-05 DIAGNOSIS — Z90722 Acquired absence of ovaries, bilateral: Secondary | ICD-10-CM | POA: Insufficient documentation

## 2023-07-05 DIAGNOSIS — R911 Solitary pulmonary nodule: Secondary | ICD-10-CM

## 2023-07-05 DIAGNOSIS — Z90710 Acquired absence of both cervix and uterus: Secondary | ICD-10-CM | POA: Diagnosis not present

## 2023-07-05 DIAGNOSIS — Z85828 Personal history of other malignant neoplasm of skin: Secondary | ICD-10-CM | POA: Insufficient documentation

## 2023-07-05 DIAGNOSIS — Z8719 Personal history of other diseases of the digestive system: Secondary | ICD-10-CM | POA: Diagnosis not present

## 2023-07-05 LAB — CEA (ACCESS): CEA (CHCC): 2.18 ng/mL (ref 0.00–5.00)

## 2023-07-05 MED ORDER — SENNOSIDES-DOCUSATE SODIUM 8.6-50 MG PO TABS
2.0000 | ORAL_TABLET | Freq: Every day | ORAL | 0 refills | Status: DC
Start: 2023-07-05 — End: 2023-07-09

## 2023-07-05 MED ORDER — POLYETHYLENE GLYCOL 3350 17 GM/SCOOP PO POWD
ORAL | 0 refills | Status: DC
Start: 2023-07-16 — End: 2023-07-09

## 2023-07-05 MED ORDER — METRONIDAZOLE 500 MG PO TABS
ORAL_TABLET | ORAL | 0 refills | Status: DC
Start: 2023-07-17 — End: 2023-07-19

## 2023-07-05 MED ORDER — TRAMADOL HCL 50 MG PO TABS
50.0000 mg | ORAL_TABLET | Freq: Four times a day (QID) | ORAL | 0 refills | Status: DC | PRN
Start: 2023-07-05 — End: 2023-08-20

## 2023-07-05 NOTE — Progress Notes (Signed)
Mrs Tiffany [name] denies chest pain or shortness of breath. Patient denies having any s/s of Covid in her household, also denies any known exposure to Covid. Mrs Tiffany [name] denies  any s/s of upper or lower respiratory infection in the past 8 weeks.   Mrs Tiffany [name]' PCP is Dr. Joselyn Arrow. Pulmonologist - Dr Shanda Howells Mechele Collin, Dr Delton Coombes. Mrs Tiffany [name] saw a cardiologist in 12/2021, Dr. Flora Lipps.  Patient had experienced chest pain, dr. Dr. Flora Lipps had an ECHO done on patient , it was normal.  Dr. Elesa Massed said not further testing needed, follow up as needed.

## 2023-07-05 NOTE — Patient Instructions (Addendum)
Preparing for your Surgery  Plan for surgery on July 18, 2023 with Dr. Eugene Garnet at [facility]. You will be scheduled for diagnostic laparoscopy, robotic assisted laparoscopic versus open pelvic mass excision, possible bowel/bladder surgery.   Pre-operative Testing -You will receive a phone call from presurgical testing at [facility] to arrange for a pre-operative appointment and lab work.  -Bring your insurance card, copy of an advanced directive if applicable, medication list  -At that visit, you will be asked to sign a consent for a possible blood transfusion in case a transfusion becomes necessary during surgery.  The need for a blood transfusion is rare but having consent is a necessary part of your care.         -You should not be taking blood thinners or aspirin at least ten days prior to surgery unless instructed by your surgeon.  -Do not take supplements such as fish oil (omega 3), red yeast rice, turmeric before your surgery. You want to avoid medications with aspirin in them including headache powders such as BC or Goody's), Excedrin migraine.  Day Before Surgery at Home -SEE BOWEL PREP INSTRUCTIONS. You will be advised you can have clear liquids up until 3 hours before your surgery.    AVOID GAS PRODUCING FOODS/LIQUIDS/BEVERAGES. Things to avoid include carbonated beverages (fizzy beverages, sodas).   If your bowels are filled with gas, your surgeon will have difficulty visualizing your pelvic organs which increases your surgical risks.  Your role in recovery Your role is to become active as soon as directed by your doctor, while still giving yourself time to heal.  Rest when you feel tired. You will be asked to do the following in order to speed your recovery:  - Cough and breathe deeply. This helps to clear and expand your lungs and can prevent pneumonia after surgery.  - STAY ACTIVE WHEN YOU GET HOME. Do mild physical activity. Walking or moving  your legs help your circulation and body functions return to normal. Do not try to get up or walk alone the first time after surgery.   -If you develop swelling on one leg or the other, pain in the back of your leg, redness/warmth in one of your legs, please call the office or go to the Emergency Room to have a doppler to rule out a blood clot. For shortness of breath, chest pain-seek care in the Emergency Room as soon as possible. - Actively manage your pain. Managing your pain lets you move in comfort. We will ask you to rate your pain on a scale of zero to 10. It is your responsibility to tell your doctor or nurse where and how much you hurt so your pain can be treated.  Special Considerations -If you are diabetic, you may be placed on insulin after surgery to have closer control over your blood sugars to promote healing and recovery.  This does not mean that you will be discharged on insulin.  If applicable, your oral antidiabetics will be resumed when you are tolerating a solid diet.  -Your final pathology results from surgery should be available around one week after surgery and the results will be relayed to you when available.  -FMLA forms can be faxed to [phone] and please allow 5-7 business days for completion.  Pain Management After Surgery -You have been prescribed your pain medication and bowel regimen medications before surgery so that you can have these available when you are discharged from the hospital. The pain  medication is for use ONLY AFTER surgery and a new prescription will not be given.   -Make sure that you have Tylenol and Ibuprofen IF YOU ARE ABLE TO TAKE THESE MEDICATIONS at home to use on a regular basis after surgery for pain control. We recommend alternating the medications every hour to six hours since they work differently and are processed in the body differently for pain relief.  -Review the attached handout on narcotic use and their risks and side effects.    Bowel Regimen -You have been prescribed Sennakot-S to take nightly to prevent constipation especially if you are taking the narcotic pain medication intermittently.  It is important to prevent constipation and drink adequate amounts of liquids. You can stop taking this medication when you are not taking pain medication and you are back on your normal bowel routine.  Risks of Surgery Risks of surgery are low but include bleeding, infection, damage to surrounding structures, re-operation, blood clots, and very rarely death.   Blood Transfusion Information (For the consent to be signed before surgery)  We will be checking your blood type before surgery so in case of emergencies, we will know what type of blood you would need.                                            WHAT IS A BLOOD TRANSFUSION?  A transfusion is the replacement of blood or some of its parts. Blood is made up of multiple cells which provide different functions. Red blood cells carry oxygen and are used for blood loss replacement. White blood cells fight against infection. Platelets control bleeding. Plasma helps clot blood. Other blood products are available for specialized needs, such as hemophilia or other clotting disorders. BEFORE THE TRANSFUSION  Who gives blood for transfusions?  You may be able to donate blood to be used at a later date on yourself (autologous donation). Relatives can be asked to donate blood. This is generally not any safer than if you have received blood from a stranger. The same precautions are taken to ensure safety when a relative's blood is donated. Healthy volunteers who are fully evaluated to make sure their blood is safe. This is blood bank blood. Transfusion therapy is the safest it has ever been in the practice of medicine. Before blood is taken from a donor, a complete history is taken to make sure that person has no history of diseases nor engages in risky social behavior (examples are  intravenous drug use or sexual activity with multiple partners). The donor's travel history is screened to minimize risk of transmitting infections, such as malaria. The donated blood is tested for signs of infectious diseases, such as HIV and hepatitis. The blood is then tested to be sure it is compatible with you in order to minimize the chance of a transfusion reaction. If you or a relative donates blood, this is often done in anticipation of surgery and is not appropriate for emergency situations. It takes many days to process the donated blood. RISKS AND COMPLICATIONS Although transfusion therapy is very safe and saves many lives, the main dangers of transfusion include:  Getting an infectious disease. Developing a transfusion reaction. This is an allergic reaction to something in the blood you were given. Every precaution is taken to prevent this. The decision to have a blood transfusion has been considered carefully by your  caregiver before blood is given. Blood is not given unless the benefits outweigh the risks.  AFTER SURGERY INSTRUCTIONS  Return to work: 4-6 weeks if applicable  You may have a white honeycomb dressing over your larger incision. This dressing can be removed 5 days after surgery and you do not need to reapply a new dressing. Once you remove the dressing, you will notice that you have the surgical glue (dermabond) on the incision and this will peel off on its own. You can get this dressing wet in the shower the days after surgery prior to removal on the 5th day.   Activity: 1. Be up and out of the bed during the day.  Take a nap if needed.  You may walk up steps but be careful and use the hand rail.  Stair climbing will tire you more than you think, you may need to stop part way and rest.   2. No lifting or straining for 6 weeks over 10 pounds. No pushing, pulling, straining for 6 weeks.  3. No driving for around 1-2 week(s), based on procedure performed.  Do not drive if  you are taking narcotic pain medicine and make sure that your reaction time has returned.   4. You can shower as soon as the next day after surgery. Shower daily.  Use your regular soap and water (not directly on the incision) and pat your incision(s) dry afterwards; don't rub.  No tub baths or submerging your body in water until cleared by your surgeon. If you have the soap that was given to you by pre-surgical testing that was used before surgery, you do not need to use it afterwards because this can irritate your incisions.   5. No sexual activity and nothing in the vagina for 4-6 weeks.  6. You may experience a small amount of clear drainage from your incisions, which is normal.  If the drainage persists, increases, or changes color please call the office.  7. Do not use creams, lotions, or ointments such as neosporin on your incisions after surgery until advised by your surgeon because they can cause removal of the dermabond glue on your incisions.    8. Take Tylenol or ibuprofen first for pain if you are able to take these medications and only use narcotic pain medication for severe pain not relieved by the Tylenol or Ibuprofen.  Monitor your Tylenol intake to a max of 4,000 mg in a 24 hour period. You can alternate these medications after surgery.  Diet: 1. Low sodium Heart Healthy Diet is recommended but you are cleared to resume your normal (before surgery) diet after your procedure.  2. It is safe to use a laxative, such as Miralax or Colace, if you have difficulty moving your bowels. You have been prescribed Sennakot-S to take at bedtime every evening after surgery to keep bowel movements regular and to prevent constipation.    Wound Care: 1. Keep clean and dry.  Shower daily.  Reasons to call the Doctor: Fever - Oral temperature greater than 100.4 degrees Fahrenheit Foul-smelling vaginal discharge Difficulty urinating Nausea and vomiting Increased pain at the site of the incision  that is unrelieved with pain medicine. Difficulty breathing with or without chest pain New calf pain especially if only on one side Sudden, continuing increased vaginal bleeding with or without clots.   Contacts: For questions or concerns you should contact:  Dr. Eugene Garnet at [phone]  Warner Mccreedy, NP at [phone]  After Hours: call [phone] and  have the GYN Oncologist paged/contacted (after 5 pm or on the weekends). You will speak with an after hours RN and let he or she know you have had surgery.  Messages sent via mychart are for non-urgent matters and are not responded to after hours so for urgent needs, please call the after hours number.  COLON BOWEL PREP     FIVE DAYS PRIOR TO YOUR SURGERY   Obtain supplies for the bowel prep at a pharmacy of your choice: Office sent prescription for your antibiotic pills (Flagyl)  A bottle of Miralax (238 g)- prescription sent in A large bottle of Gatorade/Powerade (64 oz), avoid red/purple coloring- no prescription required   2 DAYS PRIOR TO SURGERY  -Plan on changing your diet to a full liquid diet for the 2 days before surgery. Avoid items that can cause increased gas in your bowels such as carbonated beverages etc.  -Also plan on starting miralax twice daily the 2 days before surgery.   DAY PRIOR TO SURGERY     2:00pm Take 2 Flagyl (total of 1000 mg) tablets   3:00pm Take 2 Flagyl (total of 1000 mg) tablets Drink plenty of clear liquids all evening to avoid getting dehydrated   10:00pm Take 2 Flagyl (total of 1000 mg) tablets Drink 2 Carbohydrate loading nutrition drinks (ex: Ensure Presurgery). These will be given at pre-op appointment. Do not eat anything solid after bedtime (midnight) the night before your surgery.   BUT DO drink plenty of clear liquids (Water, Gatorade, juice, coffee, tea, broths, etc.) up to 3 hours prior to surgery to avoid getting dehydrated.      MORNING OF SURGERY   Remember  to not to eat anything solid that morning  Drink one final carbohydrate loading nutritional drink (ex: Ensure Presurgery) upon waking up in the morning (needs to be 3 hours before your surgery). Hold or take medications as recommended by the hospital staff at your Preoperative visit Stop drinking liquids before you leave the house (>3 hours prior to surgery)   WHAT DO I NEED TO KNOW ABOUT A FULL LIQUID DIET? You may have any liquid. You may have any food that becomes a liquid at room temperature. The food is considered a liquid if it can be poured off a spoon at room temperature. WHAT FOODS CAN I EAT? Grains Any grain food that can be pureed in soup (such as crackers, pasta, and rice). Hot cereal (such as farina or oatmeal) that has been blended.  Fruits Fruit juice, including nectars and juices with pulp. Meats and Other Protein Sources Eggs in custard, eggnog mix, and eggs used in ice cream or pudding. Strained meats, like in baby food, may be allowed. Consult your health care provider.  Dairy Milk and milk-based beverages, including milk shakes and instant breakfast mixes. Smooth yogurt. Pureed cottage cheese. Avoid these foods if they are not well tolerated. Beverages All beverages, including liquid nutritional supplements. Avoid carbonated beverages. Condiments Iodized salt, pepper, spices, and flavorings. Cocoa powder. Vinegar, ketchup, yellow mustard, smooth sauces (such as hollandaise, cheese sauce, or white sauce), and soy sauce. Sweets and Desserts Custard, smooth pudding. Flavored gelatin. Tapioca, junket. Plain ice cream, sherbet, fruit ices. Frozen ice pops, frozen fudge pops, pudding pops, and other frozen bars with cream. Syrups, including chocolate syrup. Sugar, honey, jelly.  Fats and Oils Margarine, butter, cream, sour cream, and oils. Other Broth and cream soups. Strained, broth-based soups. The items listed above may not be a complete list of recommended foods  or  beverages.  WHAT FOODS CAN I NOT EAT? Grains All breads. Grains are not allowed unless they are pureed into soup. Vegetables Vegetables are not allowed unless they are juiced, or cooked and pureed into soup. Fruits Fruits are not allowed unless they are juiced. Meats and Other Protein Sources Any meat or fish. Cooked or raw eggs. Nut butters.  Dairy Cheese.  Condiments Stone ground mustards. Fats and Oils Fats that are coarse or chunky. Sweets and Desserts Ice cream or other frozen desserts that have any solids in them or on top, such as nuts, chocolate chips, and pieces of cookies. Cakes. Cookies. Candy. Others Soups with chunks or pieces in them.

## 2023-07-09 ENCOUNTER — Ambulatory Visit (HOSPITAL_COMMUNITY): Payer: Medicare Other

## 2023-07-09 ENCOUNTER — Ambulatory Visit (HOSPITAL_BASED_OUTPATIENT_CLINIC_OR_DEPARTMENT_OTHER): Payer: Medicare Other | Admitting: Certified Registered Nurse Anesthetist

## 2023-07-09 ENCOUNTER — Encounter (HOSPITAL_COMMUNITY)
Admission: RE | Disposition: A | Discharge status: Home / Self Care | Payer: Self-pay | Source: Home / Self Care | Attending: Emergency Medicine

## 2023-07-09 ENCOUNTER — Encounter (HOSPITAL_COMMUNITY): Payer: Self-pay | Admitting: Emergency Medicine

## 2023-07-09 ENCOUNTER — Ambulatory Visit (HOSPITAL_COMMUNITY): Payer: Medicare Other | Admitting: Certified Registered Nurse Anesthetist

## 2023-07-09 ENCOUNTER — Other Ambulatory Visit: Payer: Self-pay

## 2023-07-09 ENCOUNTER — Ambulatory Visit (HOSPITAL_COMMUNITY)
Admission: RE | Admit: 2023-07-09 | Discharge: 2023-07-09 | Disposition: A | Discharge status: Home / Self Care | Payer: Medicare Other | Attending: Emergency Medicine | Admitting: Emergency Medicine

## 2023-07-09 ENCOUNTER — Telehealth: Payer: Self-pay | Admitting: Gynecologic Oncology

## 2023-07-09 DIAGNOSIS — R948 Abnormal results of function studies of other organs and systems: Secondary | ICD-10-CM | POA: Diagnosis not present

## 2023-07-09 DIAGNOSIS — Z86018 Personal history of other benign neoplasm: Secondary | ICD-10-CM | POA: Diagnosis not present

## 2023-07-09 DIAGNOSIS — Z90710 Acquired absence of both cervix and uterus: Secondary | ICD-10-CM | POA: Diagnosis not present

## 2023-07-09 DIAGNOSIS — Z8719 Personal history of other diseases of the digestive system: Secondary | ICD-10-CM | POA: Diagnosis not present

## 2023-07-09 DIAGNOSIS — R1900 Intra-abdominal and pelvic swelling, mass and lump, unspecified site: Secondary | ICD-10-CM

## 2023-07-09 DIAGNOSIS — C342 Malignant neoplasm of middle lobe, bronchus or lung: Secondary | ICD-10-CM | POA: Insufficient documentation

## 2023-07-09 DIAGNOSIS — I700 Atherosclerosis of aorta: Secondary | ICD-10-CM

## 2023-07-09 DIAGNOSIS — D3910 Neoplasm of uncertain behavior of unspecified ovary: Secondary | ICD-10-CM

## 2023-07-09 DIAGNOSIS — M1990 Unspecified osteoarthritis, unspecified site: Secondary | ICD-10-CM | POA: Diagnosis not present

## 2023-07-09 DIAGNOSIS — J449 Chronic obstructive pulmonary disease, unspecified: Secondary | ICD-10-CM | POA: Insufficient documentation

## 2023-07-09 DIAGNOSIS — R978 Other abnormal tumor markers: Secondary | ICD-10-CM | POA: Diagnosis not present

## 2023-07-09 DIAGNOSIS — Z87891 Personal history of nicotine dependence: Secondary | ICD-10-CM | POA: Diagnosis not present

## 2023-07-09 DIAGNOSIS — R918 Other nonspecific abnormal finding of lung field: Secondary | ICD-10-CM

## 2023-07-09 DIAGNOSIS — C322 Malignant neoplasm of subglottis: Secondary | ICD-10-CM | POA: Diagnosis not present

## 2023-07-09 DIAGNOSIS — Z8249 Family history of ischemic heart disease and other diseases of the circulatory system: Secondary | ICD-10-CM | POA: Insufficient documentation

## 2023-07-09 DIAGNOSIS — I739 Peripheral vascular disease, unspecified: Secondary | ICD-10-CM | POA: Diagnosis not present

## 2023-07-09 DIAGNOSIS — Z48813 Encounter for surgical aftercare following surgery on the respiratory system: Secondary | ICD-10-CM | POA: Diagnosis not present

## 2023-07-09 DIAGNOSIS — J439 Emphysema, unspecified: Secondary | ICD-10-CM | POA: Diagnosis not present

## 2023-07-09 DIAGNOSIS — D472 Monoclonal gammopathy: Secondary | ICD-10-CM | POA: Diagnosis not present

## 2023-07-09 DIAGNOSIS — R300 Dysuria: Secondary | ICD-10-CM | POA: Diagnosis not present

## 2023-07-09 DIAGNOSIS — Z85828 Personal history of other malignant neoplasm of skin: Secondary | ICD-10-CM | POA: Diagnosis not present

## 2023-07-09 DIAGNOSIS — Z801 Family history of malignant neoplasm of trachea, bronchus and lung: Secondary | ICD-10-CM | POA: Insufficient documentation

## 2023-07-09 DIAGNOSIS — I10 Essential (primary) hypertension: Secondary | ICD-10-CM

## 2023-07-09 DIAGNOSIS — Z8543 Personal history of malignant neoplasm of ovary: Secondary | ICD-10-CM | POA: Diagnosis not present

## 2023-07-09 DIAGNOSIS — E785 Hyperlipidemia, unspecified: Secondary | ICD-10-CM | POA: Diagnosis not present

## 2023-07-09 DIAGNOSIS — Z90722 Acquired absence of ovaries, bilateral: Secondary | ICD-10-CM | POA: Diagnosis not present

## 2023-07-09 DIAGNOSIS — J8410 Pulmonary fibrosis, unspecified: Secondary | ICD-10-CM | POA: Diagnosis not present

## 2023-07-09 DIAGNOSIS — R634 Abnormal weight loss: Secondary | ICD-10-CM | POA: Diagnosis not present

## 2023-07-09 DIAGNOSIS — I2510 Atherosclerotic heart disease of native coronary artery without angina pectoris: Secondary | ICD-10-CM | POA: Insufficient documentation

## 2023-07-09 DIAGNOSIS — M8580 Other specified disorders of bone density and structure, unspecified site: Secondary | ICD-10-CM | POA: Diagnosis not present

## 2023-07-09 HISTORY — PX: BRONCHIAL BIOPSY: SHX5109

## 2023-07-09 HISTORY — DX: Pneumonia, unspecified organism: J18.9

## 2023-07-09 HISTORY — PX: BRONCHIAL NEEDLE ASPIRATION BIOPSY: SHX5106

## 2023-07-09 HISTORY — DX: Dependence on supplemental oxygen: Z99.81

## 2023-07-09 HISTORY — DX: Unspecified osteoarthritis, unspecified site: M19.90

## 2023-07-09 HISTORY — PX: FIDUCIAL MARKER PLACEMENT: SHX6858

## 2023-07-09 HISTORY — PX: BRONCHIAL BRUSHINGS: SHX5108

## 2023-07-09 LAB — BASIC METABOLIC PANEL
Anion gap: 13 (ref 5–15)
BUN: 13 mg/dL (ref 8–23)
CO2: 26 mmol/L (ref 22–32)
Calcium: 9 mg/dL (ref 8.9–10.3)
Chloride: 99 mmol/L (ref 98–111)
Creatinine, Ser: 0.68 mg/dL (ref 0.44–1.00)
GFR, Estimated: >60 mL/min (ref >60)
Glucose, Bld: 122 mg/dL — ABNORMAL HIGH (ref 70–99)
Potassium: 3.9 mmol/L (ref 3.5–5.1)
Sodium: 138 mmol/L (ref 135–145)

## 2023-07-09 LAB — CBC
HCT: 41.5 % (ref 36.0–46.0)
Hemoglobin: 13.1 g/dL (ref 12.0–15.0)
MCH: 29.3 pg (ref 26.0–34.0)
MCHC: 31.6 g/dL (ref 30.0–36.0)
MCV: 92.8 fL (ref 80.0–100.0)
Platelets: 172 10*3/uL (ref 150–400)
RBC: 4.47 MIL/uL (ref 3.87–5.11)
RDW: 12.7 % (ref 11.5–15.5)
WBC: 5.3 10*3/uL (ref 4.0–10.5)
nRBC: 0 % (ref 0.0–0.2)

## 2023-07-09 SURGERY — BRONCHOSCOPY, WITH BIOPSY USING ELECTROMAGNETIC NAVIGATION
Anesthesia: General

## 2023-07-09 MED ORDER — CHLORHEXIDINE GLUCONATE 0.12 % MT SOLN
OROMUCOSAL | Status: AC
  Administered 2023-07-09: 15 mL via OROMUCOSAL
  Filled 2023-07-09: qty 15

## 2023-07-09 MED ORDER — ROCURONIUM BROMIDE 10 MG/ML (PF) SYRINGE
PREFILLED_SYRINGE | INTRAVENOUS | Status: DC | PRN
  Administered 2023-07-09: 50 mg via INTRAVENOUS

## 2023-07-09 MED ORDER — ONDANSETRON HCL 4 MG/2ML IJ SOLN: 4.0000 mg | Freq: Four times a day (QID) | INTRAMUSCULAR | Status: DC | PRN

## 2023-07-09 MED ORDER — LIDOCAINE 2% (20 MG/ML) 5 ML SYRINGE
INTRAMUSCULAR | Status: DC | PRN
  Administered 2023-07-09: 100 mg via INTRAVENOUS

## 2023-07-09 MED ORDER — POLYETHYLENE GLYCOL 3350 17 GM/SCOOP PO POWD
ORAL | Status: DC
Start: 2023-07-16 — End: 2023-07-19

## 2023-07-09 MED ORDER — ONDANSETRON HCL 4 MG/2ML IJ SOLN
INTRAMUSCULAR | Status: DC | PRN
  Administered 2023-07-09: 4 mg via INTRAVENOUS

## 2023-07-09 MED ORDER — ACETAMINOPHEN 10 MG/ML IV SOLN
INTRAVENOUS | Status: DC | PRN
Start: 2023-07-09 — End: 2023-07-09
  Administered 2023-07-09: 1000 mg via INTRAVENOUS

## 2023-07-09 MED ORDER — PROPOFOL 10 MG/ML IV BOLUS
INTRAVENOUS | Status: DC | PRN
Start: 2023-07-09 — End: 2023-07-09
  Administered 2023-07-09: 120 mg via INTRAVENOUS
  Administered 2023-07-09: 125 ug/kg/min via INTRAVENOUS

## 2023-07-09 MED ORDER — PHENYLEPHRINE HCL (PRESSORS) 10 MG/ML IV SOLN
INTRAVENOUS | Status: DC | PRN
  Administered 2023-07-09 (×3): 80 ug via INTRAVENOUS
  Administered 2023-07-09: 160 ug via INTRAVENOUS

## 2023-07-09 MED ORDER — CHLORHEXIDINE GLUCONATE 0.12 % MT SOLN: 15.0000 mL | Freq: Once | OROMUCOSAL | Status: AC

## 2023-07-09 MED ORDER — OXYCODONE HCL 5 MG/5ML PO SOLN: 5.0000 mg | Freq: Once | ORAL | Status: DC | PRN

## 2023-07-09 MED ORDER — SENNOSIDES-DOCUSATE SODIUM 8.6-50 MG PO TABS
2.0000 | ORAL_TABLET | Freq: Every day | ORAL | Status: DC
Start: 2023-07-09 — End: 2023-08-20

## 2023-07-09 MED ORDER — ACETAMINOPHEN 10 MG/ML IV SOLN
INTRAVENOUS | Status: AC
  Filled 2023-07-09: qty 100

## 2023-07-09 MED ORDER — LACTATED RINGERS IV SOLN: INTRAVENOUS | Status: DC

## 2023-07-09 MED ORDER — SUGAMMADEX SODIUM 200 MG/2ML IV SOLN
INTRAVENOUS | Status: DC | PRN
  Administered 2023-07-09: 134.2 mg via INTRAVENOUS

## 2023-07-09 MED ORDER — FENTANYL CITRATE (PF) 100 MCG/2ML IJ SOLN: 25.0000 ug | INTRAMUSCULAR | Status: DC | PRN

## 2023-07-09 MED ORDER — OXYCODONE HCL 5 MG PO TABS: 5.0000 mg | ORAL_TABLET | Freq: Once | ORAL | Status: DC | PRN

## 2023-07-09 SURGICAL SUPPLY — 1 items: SUPERLOCK FIDUCIAL MARKER IMPLANT

## 2023-07-09 NOTE — Anesthesia Procedure Notes (Signed)
Procedure Name: Intubation Date/Time: 07/09/2023 12:49 PM  Performed by: Cy Blamer, CRNAPre-anesthesia Checklist: Patient identified, Emergency Drugs available, Suction available and Patient being monitored Patient Re-evaluated:Patient Re-evaluated prior to induction Oxygen Delivery Method: Circle system utilized Preoxygenation: Pre-oxygenation with 100% oxygen Induction Type: IV induction Ventilation: Mask ventilation without difficulty Laryngoscope Size: Mac and 3 Grade View: Grade I Tube type: Oral Tube size: 8.5 mm Number of attempts: 1 Airway Equipment and Method: Stylet Placement Confirmation: ETT inserted through vocal cords under direct vision, positive ETCO2 and breath sounds checked- equal and bilateral Secured at: 21 cm Tube secured with: Tape Dental Injury: Teeth and Oropharynx as per pre-operative assessment  Comments: Intubation performed by [facility]

## 2023-07-09 NOTE — Telephone Encounter (Signed)
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 Phone busy when I called, unable to leave message.  Eugene Garnet MD Gynecologic Oncology

## 2023-07-09 NOTE — Anesthesia Preprocedure Evaluation (Signed)
Anesthesia Evaluation  Patient identified by MRN, date of birth, ID band Patient awake    Reviewed: Allergy & Precautions, H&P , NPO status , Patient's Chart, lab work & pertinent test results  Airway Mallampati: II   Neck ROM: full    Dental   Pulmonary COPD, former smoker   breath sounds clear to auscultation       Cardiovascular hypertension, + CAD and + Peripheral Vascular Disease   Rhythm:regular Rate:Normal     Neuro/Psych    GI/Hepatic   Endo/Other    Renal/GU      Musculoskeletal  (+) Arthritis ,    Abdominal   Peds  Hematology   Anesthesia Other Findings   Reproductive/Obstetrics                             Anesthesia Physical Anesthesia Plan  ASA: 3  Anesthesia Plan: General   Post-op Pain Management:    Induction: Intravenous  PONV Risk Score and Plan: 3 and Ondansetron, Dexamethasone, Midazolam and Treatment may vary due to age or medical condition  Airway Management Planned: Oral ETT  Additional Equipment:   Intra-op Plan:   Post-operative Plan: Extubation in OR  Informed Consent: I have reviewed the patients History and Physical, chart, labs and discussed the procedure including the risks, benefits and alternatives for the proposed anesthesia with the patient or authorized representative who has indicated his/her understanding and acceptance.     Dental advisory given  Plan Discussed with: CRNA, Anesthesiologist and Surgeon  Anesthesia Plan Comments:        Anesthesia Quick Evaluation

## 2023-07-09 NOTE — Interval H&P Note (Signed)
History and Physical Interval Note:  07/09/2023 11:23 AM  Tiffany [name]  has presented today for surgery, with the diagnosis of RIGHT UPPER LOPE MASS.  The various methods of treatment have been discussed with the patient and family. After consideration of risks, benefits and other options for treatment, the patient has consented to  Procedure(s): ROBOTIC ASSISTED NAVIGATIONAL BRONCHOSCOPY (N/A) as a surgical intervention.  The patient's history has been reviewed, patient examined, no change in status, stable for surgery.  I have reviewed the patient's chart and labs.  Questions were answered to the patient's satisfaction.     Leslye Peer

## 2023-07-09 NOTE — Op Note (Signed)
Video Bronchoscopy with Robotic Assisted Bronchoscopic Navigation   Date of Operation: 07/09/2023   Pre-op Diagnosis: Right middle lobe mixed density mass  Post-op Diagnosis: Right middle lobe mixed density mass  Surgeon: Levy Pupa  Assistants: None  Anesthesia: General endotracheal anesthesia  Operation: Flexible video fiberoptic bronchoscopy with robotic assistance and biopsies.  Estimated Blood Loss: Minimal  Complications: None  Indications and History: Tiffany [name] is a 77 y.o. female with history of tobacco use and COPD, ovarian cancer, hypertension.  She was found to have a 2.8 cm mixed density mass lesion in the superior right middle lobe, hypermetabolic on PET scan.  Recommendation made to achieve a tissue diagnosis via robotic assisted navigational bronchoscopy. The risks, benefits, complications, treatment options and expected outcomes were discussed with the patient.  The possibilities of pneumothorax, pneumonia, reaction to medication, pulmonary aspiration, perforation of a viscus, bleeding, failure to diagnose a condition and creating a complication requiring transfusion or operation were discussed with the patient who freely signed the consent.    Description of Procedure: The patient was seen in the Preoperative Area, was examined and was deemed appropriate to proceed.  The patient was taken to [facility] endoscopy room 3, identified as Tiffany [name] and the procedure verified as Flexible Video Fiberoptic Bronchoscopy.  A Time Out was held and the above information confirmed.   Prior to the date of the procedure a high-resolution CT scan of the chest was performed. Utilizing ION software program a virtual tracheobronchial tree was generated to allow the creation of distinct navigation pathways to the patient's parenchymal abnormalities. After being taken to the operating room general anesthesia was initiated and the patient  was orally intubated. The video fiberoptic  bronchoscope was introduced via the endotracheal tube and a general inspection was performed which showed normal right and left lung anatomy. Aspiration of the bilateral mainstems was completed to remove any remaining secretions. Robotic catheter inserted into patient's endotracheal tube.   Target #1 right middle lobe mass: The distinct navigation pathways prepared prior to this procedure were then utilized to navigate to patient's lesion identified on CT scan. The robotic catheter was secured into place and the vision probe was withdrawn.  Lesion location was approximated using fluoroscopy.  Local registration and targeting was performed using Cios three-dimensional imaging.  Under fluoroscopic guidance transbronchial needle brushings, transbronchial needle biopsies, and transbronchial forceps biopsies were performed to be sent for cytology and pathology.  Under fluoroscopic guidance a single fiducial marker was placed adjacent to the lesion.  At the end of the procedure a general airway inspection was performed and there was no evidence of active bleeding. The bronchoscope was removed.  The patient tolerated the procedure well. There was no significant blood loss and there were no obvious complications. A post-procedural chest x-ray is pending.  Samples Target #1: 1. Transbronchial needle brushings from right middle lobe mass 2. Transbronchial Wang needle biopsies from right middle lobe mass 3. Transbronchial forceps biopsies from right middle lobe mass    Plans:  The patient will be discharged from the PACU to home when recovered from anesthesia and after chest x-ray is reviewed. We will review the cytology, pathology and microbiology results with the patient when they become available. Outpatient followup will be with Dr. Delton Coombes or Dr. Everardo All.    Levy Pupa, MD, PhD 07/09/2023, 1:31 PM [facility] [phone] or if no answer before 7:00PM call [phone] For any  issues after 7:00PM please call eLink [phone]

## 2023-07-09 NOTE — Discharge Instructions (Signed)
Flexible Bronchoscopy, Care After This sheet gives you information about how to care for yourself after your test. Your doctor may also give you more specific instructions. If you have problems or questions, contact your doctor. Follow these instructions at home: Eating and drinking When your numbness is gone and your cough and gag reflexes have come back, you may: Eat only soft foods. Slowly drink liquids. The day after the test, go back to your normal diet. Driving Do not drive for 24 hours if you were given a medicine to help you relax (sedative). Do not drive or use heavy machinery while taking prescription pain medicine. General instructions  Take over-the-counter and prescription medicines only as told by your doctor. Return to your normal activities as told. Ask what activities are safe for you. Do not use any products that have nicotine or tobacco in them. This includes cigarettes and e-cigarettes. If you need help quitting, ask your doctor. Keep all follow-up visits as told by your doctor. This is important. It is very important if you had a tissue sample (biopsy) taken. Get help right away if: You have shortness of breath that gets worse. You get light-headed. You feel like you are going to pass out (faint). You have chest pain. You cough up: More than a little blood. More blood than before. Summary Do not eat or drink anything (not even water) for 2 hours after your test, or until your numbing medicine wears off. Do not use cigarettes. Do not use e-cigarettes. Get help right away if you have chest pain.  Please call our office for any questions or concerns.  [phone].  This information is not intended to replace advice given to you by your health care provider. Make sure you discuss any questions you have with your health care provider. Document Released: 09/09/2009 Document Revised: 10/25/2017 Document Reviewed: 11/30/2016 Elsevier Patient Education  2020 [employer]  

## 2023-07-09 NOTE — Transfer of Care (Signed)
Immediate Anesthesia Transfer of Care Note  Patient: Tiffany [name]  Procedure(s) Performed: ROBOTIC ASSISTED NAVIGATIONAL BRONCHOSCOPY BRONCHIAL BIOPSIES BRONCHIAL NEEDLE ASPIRATION BIOPSIES BRONCHIAL BRUSHINGS FIDUCIAL MARKER PLACEMENT  Patient Location: PACU  Anesthesia Type:General  Level of Consciousness: awake and patient cooperative  Airway & Oxygen Therapy: Patient Spontanous Breathing and Patient connected to face mask oxygen  Post-op Assessment: Report given to RN  Post vital signs: Reviewed and stable  Last Vitals:  Vitals Value Taken Time  BP 144/51 07/09/23 1337  Temp    Pulse 64 07/09/23 1338  Resp 19 07/09/23 1338  SpO2 100 % 07/09/23 1338  Vitals shown include unfiled device data.  Last Pain:  Vitals:   07/09/23 1106  TempSrc:   PainSc: 0-No pain         Complications: No notable events documented.

## 2023-07-10 NOTE — Anesthesia Postprocedure Evaluation (Signed)
Anesthesia Post Note  Patient: Tiffany [name]  Procedure(s) Performed: ROBOTIC ASSISTED NAVIGATIONAL BRONCHOSCOPY BRONCHIAL BIOPSIES BRONCHIAL NEEDLE ASPIRATION BIOPSIES BRONCHIAL BRUSHINGS FIDUCIAL MARKER PLACEMENT     Patient location during evaluation: PACU Anesthesia Type: General Level of consciousness: awake and alert Pain management: pain level controlled Vital Signs Assessment: post-procedure vital signs reviewed and stable Respiratory status: spontaneous breathing, nonlabored ventilation, respiratory function stable and patient connected to nasal cannula oxygen Cardiovascular status: blood pressure returned to baseline and stable Postop Assessment: no apparent nausea or vomiting Anesthetic complications: no   No notable events documented.  Last Vitals:  Vitals:   07/09/23 1415 07/09/23 1425  BP: (!) 143/50 (!) 136/51  Pulse: 63 61  Resp: 17 18  Temp:  (!) 36.2 C  SpO2: 97% 98%    Last Pain:  Vitals:   07/09/23 1106  TempSrc:   PainSc: 0-No pain                 , S

## 2023-07-11 ENCOUNTER — Ambulatory Visit: Payer: Medicare Other | Admitting: Hematology and Oncology

## 2023-07-11 ENCOUNTER — Other Ambulatory Visit: Payer: Medicare Other

## 2023-07-11 ENCOUNTER — Telehealth: Payer: Self-pay | Admitting: Emergency Medicine

## 2023-07-11 NOTE — Progress Notes (Signed)
COVID Vaccine Completed: yes   Date of COVID positive in last 90 days:  PCP - Joselyn Arrow, MD Cardiologist - Lennie Odor, MD LOV 01/18/22 Pulmonologist- Mechele Collin, MD  Chest x-ray - 07/09/23 Epic EKG - 07/09/23 Epic Stress Test -  ECHO - 01/31/21 Epic Cardiac Cath -  Pacemaker/ICD device last checked: Spinal Cord Stimulator:  Bowel Prep - clears day before, antibiotics, dulcolax  Sleep Study -  CPAP -   Fasting Blood Sugar -  Checks Blood Sugar _____ times a day  Last dose of GLP1 agonist-  N/A GLP1 instructions:  N/A   Last dose of SGLT-2 inhibitors-  N/A SGLT-2 instructions: N/A   Blood Thinner Instructions:  Time Aspirin Instructions: Last Dose:  Activity level:  Can go up a flight of stairs and perform activities of daily living without stopping and without symptoms of chest pain or shortness of breath.  Able to exercise without symptoms  Unable to go up a flight of stairs without symptoms of     Anesthesia review: HTN, aortic atherosclerosis, COPD, emphysema, CP  Patient denies shortness of breath, fever, cough and chest pain at PAT appointment  Patient verbalized understanding of instructions that were given to them at the PAT appointment. Patient was also instructed that they will need to review over the PAT instructions again at home before surgery.

## 2023-07-12 ENCOUNTER — Encounter (HOSPITAL_COMMUNITY): Payer: Self-pay

## 2023-07-12 ENCOUNTER — Other Ambulatory Visit: Payer: Self-pay

## 2023-07-12 ENCOUNTER — Telehealth: Payer: Self-pay | Admitting: Gynecologic Oncology

## 2023-07-12 ENCOUNTER — Encounter (HOSPITAL_COMMUNITY)
Admission: RE | Admit: 2023-07-12 | Discharge: 2023-07-12 | Disposition: A | Discharge status: Home / Self Care | Payer: Medicare Other | Source: Ambulatory Visit | Attending: Gynecologic Oncology | Admitting: Gynecologic Oncology

## 2023-07-12 ENCOUNTER — Telehealth: Payer: Self-pay | Admitting: Emergency Medicine

## 2023-07-12 DIAGNOSIS — Z01812 Encounter for preprocedural laboratory examination: Secondary | ICD-10-CM | POA: Insufficient documentation

## 2023-07-12 DIAGNOSIS — Z01818 Encounter for other preprocedural examination: Secondary | ICD-10-CM | POA: Diagnosis not present

## 2023-07-12 DIAGNOSIS — R1900 Intra-abdominal and pelvic swelling, mass and lump, unspecified site: Secondary | ICD-10-CM | POA: Diagnosis not present

## 2023-07-12 DIAGNOSIS — R918 Other nonspecific abnormal finding of lung field: Secondary | ICD-10-CM

## 2023-07-12 LAB — CBC
HCT: 42 % (ref 36.0–46.0)
Hemoglobin: 13.1 g/dL (ref 12.0–15.0)
MCH: 29.4 pg (ref 26.0–34.0)
MCHC: 31.2 g/dL (ref 30.0–36.0)
MCV: 94.4 fL (ref 80.0–100.0)
Platelets: 171 10*3/uL (ref 150–400)
RBC: 4.45 MIL/uL (ref 3.87–5.11)
RDW: 12.8 % (ref 11.5–15.5)
WBC: 6.3 10*3/uL (ref 4.0–10.5)
nRBC: 0 % (ref 0.0–0.2)

## 2023-07-12 LAB — COMPREHENSIVE METABOLIC PANEL
ALT: 18 U/L (ref 0–44)
AST: 20 U/L (ref 15–41)
Albumin: 4.1 g/dL (ref 3.5–5.0)
Alkaline Phosphatase: 89 U/L (ref 38–126)
Anion gap: 8 (ref 5–15)
BUN: 17 mg/dL (ref 8–23)
CO2: 28 mmol/L (ref 22–32)
Calcium: 9.3 mg/dL (ref 8.9–10.3)
Chloride: 99 mmol/L (ref 98–111)
Creatinine, Ser: 0.71 mg/dL (ref 0.44–1.00)
GFR, Estimated: >60 mL/min (ref >60)
Glucose, Bld: 118 mg/dL — ABNORMAL HIGH (ref 70–99)
Potassium: 4.5 mmol/L (ref 3.5–5.1)
Sodium: 135 mmol/L (ref 135–145)
Total Bilirubin: 0.7 mg/dL (ref 0.3–1.2)
Total Protein: 7.5 g/dL (ref 6.5–8.1)

## 2023-07-12 NOTE — Progress Notes (Signed)
COVID Vaccine Completed: yes   Date of COVID positive in last 90 days:   PCP - Joselyn Arrow, MD Cardiologist - Lennie Odor, MD LOV 01/18/22 Pulmonologist- Mechele Collin, MD   Chest x-ray - 07/09/23 Epic EKG - 07/09/23 Epic Stress Test - 30 years ago per pt ECHO - 01/31/21 Epic Cardiac Cath - n/a Pacemaker/ICD device last checked: n/a Spinal Cord Stimulator: n/a   Bowel Prep - clears day before, antibiotics, dulcolax. Patient has information. Getting meds today   Sleep Study - n/a CPAP -    Fasting Blood Sugar - n/a Checks Blood Sugar _____ times a day   Last dose of GLP1 agonist-  N/A GLP1 instructions:  N/A   Last dose of SGLT-2 inhibitors-  N/A SGLT-2 instructions: N/A     Blood Thinner Instructions: n/a Aspirin Instructions: Last Dose:   Activity level:   Can go up a flight of stairs and perform activities of daily living without stopping and without symptoms of chest pain. SOB, not new per pt, sees pulm            Anesthesia review: HTN, aortic atherosclerosis, COPD, emphysema, CP, O2 2L at hs   Patient denies shortness of breath, fever, cough and chest pain at PAT appointment   Patient verbalized understanding of instructions that were given to them at the PAT appointment. Patient was also instructed that they will need to review over the PAT instructions again at home before surgery

## 2023-07-12 NOTE — Patient Instructions (Signed)
SURGICAL WAITING ROOM VISITATION  Patients having surgery or a procedure may have no more than 2 support people in the waiting area - these visitors may rotate.    Children under the age of 45 must have an adult with them who is not the patient.  Due to an increase in RSV and influenza rates and associated hospitalizations, children ages 71 and under may not visit patients in [facility] hospitals.  If the patient needs to stay at the hospital during part of their recovery, the visitor guidelines for inpatient rooms apply. Pre-op nurse will coordinate an appropriate time for 1 support person to accompany patient in pre-op.  This support person may not rotate.    Please refer to the [facility] website for the visitor guidelines for Inpatients (after your surgery is over and you are in a regular room).    Your procedure is scheduled on: 07/18/23   Report to [facility] Main Entrance    Report to admitting at 10:15 AM   Call this number if you have problems the morning of surgery [phone]   Follow a clear liquid diet the day before surgery   You may have the following liquids until 9:30 AM DAY OF SURGERY  Water Non-Citrus Juices (without pulp, NO RED-Apple, White grape, White cranberry) Black Coffee (NO MILK/CREAM OR CREAMERS, sugar ok)  Clear Tea (NO MILK/CREAM OR CREAMERS, sugar ok) regular and decaf                             Plain Jell-O (NO RED)                                           Fruit ices (not with fruit pulp, NO RED)                                     Popsicles (NO RED)                                                               Sports drinks like Gatorade (NO RED)          If you have questions, please contact your surgeon's office.   FOLLOW BOWEL PREP AND ANY ADDITIONAL PRE OP INSTRUCTIONS YOU RECEIVED FROM YOUR SURGEON'S OFFICE!!!     Oral Hygiene is also important to reduce your risk of infection.                                    Remember -  BRUSH YOUR TEETH THE MORNING OF SURGERY WITH YOUR REGULAR TOOTHPASTE  DENTURES WILL BE REMOVED PRIOR TO SURGERY PLEASE DO NOT APPLY "Poly grip" OR ADHESIVES!!!   Stop all vitamins and herbal supplements 7 days before surgery.   Take these medicines the morning of surgery with A SIP OF WATER: Albuterol, Rosuvastatin  You may not have any metal on your body including hair pins, jewelry, and body piercing             Do not wear make-up, lotions, powders, perfumes, or deodorant  Do not wear nail polish including gel and S&S, artificial/acrylic nails, or any other type of covering on natural nails including finger and toenails. If you have artificial nails, gel coating, etc. that needs to be removed by a nail salon please have this removed prior to surgery or surgery may need to be canceled/ delayed if the surgeon/ anesthesia feels like they are unable to be safely monitored.   Do not shave  48 hours prior to surgery.    Do not bring valuables to the hospital. [facility] IS NOT             RESPONSIBLE   FOR VALUABLES.   Contacts, glasses, dentures or bridgework may not be worn into surgery.   Bring small overnight bag day of surgery.   DO NOT BRING YOUR HOME MEDICATIONS TO THE HOSPITAL. PHARMACY WILL DISPENSE MEDICATIONS LISTED ON YOUR MEDICATION LIST TO YOU DURING YOUR ADMISSION IN THE HOSPITAL!   Special Instructions: Bring a copy of your healthcare power of attorney and living will documents the day of surgery if you haven't scanned them before.              Please read over the following fact sheets you were given: IF YOU HAVE QUESTIONS ABOUT YOUR PRE-OP INSTRUCTIONS PLEASE CALL [phone] Contras    If you received a COVID test during your pre-op visit  it is requested that you wear a mask when out in public, stay away from anyone that may not be feeling well and notify your surgeon if you develop symptoms. If you test positive for Covid or have been in  contact with anyone that has tested positive in the last 10 days please notify you surgeon.    [facility] - Preparing for Surgery Before surgery, you can play an important role.  Because skin is not sterile, your skin needs to be as free of germs as possible.  You can reduce the number of germs on your skin by washing with CHG (chlorahexidine gluconate) soap before surgery.  CHG is an antiseptic cleaner which kills germs and bonds with the skin to continue killing germs even after washing. Please DO NOT use if you have an allergy to CHG or antibacterial soaps.  If your skin becomes reddened/irritated stop using the CHG and inform your nurse when you arrive at Short Stay. Do not shave (including legs and underarms) for at least 48 hours prior to the first CHG shower.  You may shave your face/neck.  Please follow these instructions carefully:  1.  Shower with CHG Soap the night before surgery and the  morning of surgery.  2.  If you choose to wash your hair, wash your hair first as usual with your normal  shampoo.  3.  After you shampoo, rinse your hair and body thoroughly to remove the shampoo.                             4.  Use CHG as you would any other liquid soap.  You can apply chg directly to the skin and wash.  Gently with a scrungie or clean washcloth.  5.  Apply the CHG Soap to your body ONLY FROM THE NECK DOWN.  Do   not use on face/ open                           Wound or open sores. Avoid contact with eyes, ears mouth and   genitals (private parts).                       Wash face,  Genitals (private parts) with your normal soap.             6.  Wash thoroughly, paying special attention to the area where your    surgery  will be performed.  7.  Thoroughly rinse your body with warm water from the neck down.  8.  DO NOT shower/wash with your normal soap after using and rinsing off the CHG Soap.                9.  Pat yourself dry with a clean towel.            10.  Wear clean  pajamas.            11.  Place clean sheets on your bed the night of your first shower and do not  sleep with pets. Day of Surgery : Do not apply any lotions/deodorants the morning of surgery.  Please wear clean clothes to the hospital/surgery center.  FAILURE TO FOLLOW THESE INSTRUCTIONS MAY RESULT IN THE CANCELLATION OF YOUR SURGERY  PATIENT SIGNATURE_________________________________  NURSE SIGNATURE__________________________________  ________________________________________________________________________ WHAT IS A BLOOD TRANSFUSION? Blood Transfusion Information  A transfusion is the replacement of blood or some of its parts. Blood is made up of multiple cells which provide different functions. Red blood cells carry oxygen and are used for blood loss replacement. White blood cells fight against infection. Platelets control bleeding. Plasma helps clot blood. Other blood products are available for specialized needs, such as hemophilia or other clotting disorders. BEFORE THE TRANSFUSION  Who gives blood for transfusions?  Healthy volunteers who are fully evaluated to make sure their blood is safe. This is blood bank blood. Transfusion therapy is the safest it has ever been in the practice of medicine. Before blood is taken from a donor, a complete history is taken to make sure that person has no history of diseases nor engages in risky social behavior (examples are intravenous drug use or sexual activity with multiple partners). The donor's travel history is screened to minimize risk of transmitting infections, such as malaria. The donated blood is tested for signs of infectious diseases, such as HIV and hepatitis. The blood is then tested to be sure it is compatible with you in order to minimize the chance of a transfusion reaction. If you or a relative donates blood, this is often done in anticipation of surgery and is not appropriate for emergency situations. It takes many days to process  the donated blood. RISKS AND COMPLICATIONS Although transfusion therapy is very safe and saves many lives, the main dangers of transfusion include:  Getting an infectious disease. Developing a transfusion reaction. This is an allergic reaction to something in the blood you were given. Every precaution is taken to prevent this. The decision to have a blood transfusion has been considered carefully by your caregiver before blood is given. Blood is not given unless the benefits outweigh the risks. AFTER THE TRANSFUSION Right after receiving a blood transfusion, you will usually feel much better and more energetic. This is especially true if  your red blood cells have gotten low (anemic). The transfusion raises the level of the red blood cells which carry oxygen, and this usually causes an energy increase. The nurse administering the transfusion will monitor you carefully for complications. HOME CARE INSTRUCTIONS  No special instructions are needed after a transfusion. You may find your energy is better. Speak with your caregiver about any limitations on activity for underlying diseases you may have. SEEK MEDICAL CARE IF:  Your condition is not improving after your transfusion. You develop redness or irritation at the intravenous (IV) site. SEEK IMMEDIATE MEDICAL CARE IF:  Any of the following symptoms occur over the next 12 hours: Shaking chills. You have a temperature by mouth above 102 F (38.9 C), not controlled by medicine. Chest, back, or muscle pain. People around you feel you are not acting correctly or are confused. Shortness of breath or difficulty breathing. Dizziness and fainting. You get a rash or develop hives. You have a decrease in urine output. Your urine turns a dark color or changes to pink, red, or brown. Any of the following symptoms occur over the next 10 days: You have a temperature by mouth above 102 F (38.9 C), not controlled by medicine. Shortness of  breath. Weakness after normal activity. The white part of the eye turns yellow (jaundice). You have a decrease in the amount of urine or are urinating less often. Your urine turns a dark color or changes to pink, red, or brown. Document Released: 11/09/2000 Document Revised: 02/04/2012 Document Reviewed: 06/28/2008 [facility] Patient Information 2014 [place], [place].  _______________________________________________________________________

## 2023-07-12 NOTE — Telephone Encounter (Signed)
Called patient and relayed information about inhibin B.  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  Also disclosed recent lung biopsy shows adenocarcinoma, supporting a secondary process.  Eugene Garnet MD Gynecologic Oncology

## 2023-07-12 NOTE — Telephone Encounter (Signed)
Called the patient for bronchoscopy results.  This showed adenocarcinoma.  No answer so left VM.  I will have to call her back.  She will need referral to thoracic oncology in addition to her existing follow-up with Dr. Pricilla Holm.

## 2023-07-15 ENCOUNTER — Encounter (HOSPITAL_COMMUNITY): Payer: Self-pay | Admitting: Emergency Medicine

## 2023-07-15 NOTE — Telephone Encounter (Signed)
Called patient again to discuss bronchoscopy results.  No answer, left VM.  She has reviewed the use already in part with Dr. Pricilla Holm when they had a chance to speak last week.  She needs an office visit With Dr. Arbutus Ped to address the pulmonary process in addition to the plans for her intra-abdominal process.  I will make that referral today.

## 2023-07-16 NOTE — Patient Instructions (Signed)
 Preparing for your Surgery  Plan for surgery on July 18, 2023 with Dr. Eugene Garnet at [facility]. You will be scheduled for diagnostic laparoscopy, robotic assisted laparoscopic versus open pelvic mass excision, possible bowel/bladder surgery.   Pre-operative Testing -You will receive a phone call from presurgical testing at [facility] to arrange for a pre-operative appointment and lab work.  -Bring your insurance card, copy of an advanced directive if applicable, medication list  -At that visit, you will be asked to sign a consent for a possible blood transfusion in case a transfusion becomes necessary during surgery.  The need for a blood transfusion is rare but having consent is a necessary part of your care.         -You should not be taking blood thinners or aspirin at least ten days prior to surgery unless instructed by your surgeon.  -Do not take supplements such as fish oil (omega 3), red yeast rice, turmeric before your surgery. You want to avoid medications with aspirin in them including headache powders such as BC or Goody's), Excedrin migraine.  Day Before Surgery at Home -SEE BOWEL PREP INSTRUCTIONS. You will be advised you can have clear liquids up until 3 hours before your surgery.    AVOID GAS PRODUCING FOODS/LIQUIDS/BEVERAGES. Things to avoid include carbonated beverages (fizzy beverages, sodas).   If your bowels are filled with gas, your surgeon will have difficulty visualizing your pelvic organs which increases your surgical risks.  Your role in recovery Your role is to become active as soon as directed by your doctor, while still giving yourself time to heal.  Rest when you feel tired. You will be asked to do the following in order to speed your recovery:  - Cough and breathe deeply. This helps to clear and expand your lungs and can prevent pneumonia after surgery.  - STAY ACTIVE WHEN YOU GET HOME. Do mild physical activity. Walking or moving  your legs help your circulation and body functions return to normal. Do not try to get up or walk alone the first time after surgery.   -If you develop swelling on one leg or the other, pain in the back of your leg, redness/warmth in one of your legs, please call the office or go to the Emergency Room to have a doppler to rule out a blood clot. For shortness of breath, chest pain-seek care in the Emergency Room as soon as possible. - Actively manage your pain. Managing your pain lets you move in comfort. We will ask you to rate your pain on a scale of zero to 10. It is your responsibility to tell your doctor or nurse where and how much you hurt so your pain can be treated.  Special Considerations -If you are diabetic, you may be placed on insulin after surgery to have closer control over your blood sugars to promote healing and recovery.  This does not mean that you will be discharged on insulin.  If applicable, your oral antidiabetics will be resumed when you are tolerating a solid diet.  -Your final pathology results from surgery should be available around one week after surgery and the results will be relayed to you when available.  -FMLA forms can be faxed to [phone] and please allow 5-7 business days for completion.  Pain Management After Surgery -You have been prescribed your pain medication and bowel regimen medications before surgery so that you can have these available when you are discharged from the hospital. The pain  medication is for use ONLY AFTER surgery and a new prescription will not be given.   -Make sure that you have Tylenol and Ibuprofen IF YOU ARE ABLE TO TAKE THESE MEDICATIONS at home to use on a regular basis after surgery for pain control. We recommend alternating the medications every hour to six hours since they work differently and are processed in the body differently for pain relief.  -Review the attached handout on narcotic use and their risks and side effects.    Bowel Regimen -You have been prescribed Sennakot-S to take nightly to prevent constipation especially if you are taking the narcotic pain medication intermittently.  It is important to prevent constipation and drink adequate amounts of liquids. You can stop taking this medication when you are not taking pain medication and you are back on your normal bowel routine.  Risks of Surgery Risks of surgery are low but include bleeding, infection, damage to surrounding structures, re-operation, blood clots, and very rarely death.   Blood Transfusion Information (For the consent to be signed before surgery)  We will be checking your blood type before surgery so in case of emergencies, we will know what type of blood you would need.                                            WHAT IS A BLOOD TRANSFUSION?  A transfusion is the replacement of blood or some of its parts. Blood is made up of multiple cells which provide different functions. Red blood cells carry oxygen and are used for blood loss replacement. White blood cells fight against infection. Platelets control bleeding. Plasma helps clot blood. Other blood products are available for specialized needs, such as hemophilia or other clotting disorders. BEFORE THE TRANSFUSION  Who gives blood for transfusions?  You may be able to donate blood to be used at a later date on yourself (autologous donation). Relatives can be asked to donate blood. This is generally not any safer than if you have received blood from a stranger. The same precautions are taken to ensure safety when a relative's blood is donated. Healthy volunteers who are fully evaluated to make sure their blood is safe. This is blood bank blood. Transfusion therapy is the safest it has ever been in the practice of medicine. Before blood is taken from a donor, a complete history is taken to make sure that person has no history of diseases nor engages in risky social behavior (examples are  intravenous drug use or sexual activity with multiple partners). The donor's travel history is screened to minimize risk of transmitting infections, such as malaria. The donated blood is tested for signs of infectious diseases, such as HIV and hepatitis. The blood is then tested to be sure it is compatible with you in order to minimize the chance of a transfusion reaction. If you or a relative donates blood, this is often done in anticipation of surgery and is not appropriate for emergency situations. It takes many days to process the donated blood. RISKS AND COMPLICATIONS Although transfusion therapy is very safe and saves many lives, the main dangers of transfusion include:  Getting an infectious disease. Developing a transfusion reaction. This is an allergic reaction to something in the blood you were given. Every precaution is taken to prevent this. The decision to have a blood transfusion has been considered carefully by your  caregiver before blood is given. Blood is not given unless the benefits outweigh the risks.  AFTER SURGERY INSTRUCTIONS  Return to work: 4-6 weeks if applicable  You may have a white honeycomb dressing over your larger incision. This dressing can be removed 5 days after surgery and you do not need to reapply a new dressing. Once you remove the dressing, you will notice that you have the surgical glue (dermabond) on the incision and this will peel off on its own. You can get this dressing wet in the shower the days after surgery prior to removal on the 5th day.   Activity: 1. Be up and out of the bed during the day.  Take a nap if needed.  You may walk up steps but be careful and use the hand rail.  Stair climbing will tire you more than you think, you may need to stop part way and rest.   2. No lifting or straining for 6 weeks over 10 pounds. No pushing, pulling, straining for 6 weeks.  3. No driving for around 1-2 week(s), based on procedure performed.  Do not drive if  you are taking narcotic pain medicine and make sure that your reaction time has returned.   4. You can shower as soon as the next day after surgery. Shower daily.  Use your regular soap and water (not directly on the incision) and pat your incision(s) dry afterwards; don't rub.  No tub baths or submerging your body in water until cleared by your surgeon. If you have the soap that was given to you by pre-surgical testing that was used before surgery, you do not need to use it afterwards because this can irritate your incisions.   5. No sexual activity and nothing in the vagina for 4-6 weeks.  6. You may experience a small amount of clear drainage from your incisions, which is normal.  If the drainage persists, increases, or changes color please call the office.  7. Do not use creams, lotions, or ointments such as neosporin on your incisions after surgery until advised by your surgeon because they can cause removal of the dermabond glue on your incisions.    8. Take Tylenol or ibuprofen first for pain if you are able to take these medications and only use narcotic pain medication for severe pain not relieved by the Tylenol or Ibuprofen.  Monitor your Tylenol intake to a max of 4,000 mg in a 24 hour period. You can alternate these medications after surgery.  Diet: 1. Low sodium Heart Healthy Diet is recommended but you are cleared to resume your normal (before surgery) diet after your procedure.  2. It is safe to use a laxative, such as Miralax or Colace, if you have difficulty moving your bowels. You have been prescribed Sennakot-S to take at bedtime every evening after surgery to keep bowel movements regular and to prevent constipation.    Wound Care: 1. Keep clean and dry.  Shower daily.  Reasons to call the Doctor: Fever - Oral temperature greater than 100.4 degrees Fahrenheit Foul-smelling vaginal discharge Difficulty urinating Nausea and vomiting Increased pain at the site of the incision  that is unrelieved with pain medicine. Difficulty breathing with or without chest pain New calf pain especially if only on one side Sudden, continuing increased vaginal bleeding with or without clots.   Contacts: For questions or concerns you should contact:  Dr. Eugene Garnet at [phone]  Warner Mccreedy, NP at [phone]  After Hours: call [phone] and  have the GYN Oncologist paged/contacted (after 5 pm or on the weekends). You will speak with an after hours RN and let he or she know you have had surgery.  Messages sent via mychart are for non-urgent matters and are not responded to after hours so for urgent needs, please call the after hours number.  [facility] BOWEL PREP     FIVE DAYS PRIOR TO YOUR SURGERY   Obtain supplies for the bowel prep at a pharmacy of your choice: Office sent prescription for your antibiotic pills (Flagyl)  A bottle of Miralax (238 g)- prescription sent in A large bottle of Gatorade/Powerade (64 oz), avoid red/purple coloring- no prescription required   2 DAYS PRIOR TO SURGERY  -Plan on changing your diet to a full liquid diet for the 2 days before surgery. Avoid items that can cause increased gas in your bowels such as carbonated beverages etc.  -Also plan on starting miralax twice daily the 2 days before surgery.   DAY PRIOR TO SURGERY     2:00pm Take 2 Flagyl (total of 1000 mg) tablets   3:00pm Take 2 Flagyl (total of 1000 mg) tablets Drink plenty of clear liquids all evening to avoid getting dehydrated   10:00pm Take 2 Flagyl (total of 1000 mg) tablets Drink 2 Carbohydrate loading nutrition drinks (ex: Ensure Presurgery). These will be given at pre-op appointment. Do not eat anything solid after bedtime (midnight) the night before your surgery.   BUT DO drink plenty of clear liquids (Water, Gatorade, juice, coffee, tea, broths, etc.) up to 3 hours prior to surgery to avoid getting dehydrated.      MORNING OF SURGERY   Remember  to not to eat anything solid that morning  Drink one final carbohydrate loading nutritional drink (ex: Ensure Presurgery) upon waking up in the morning (needs to be 3 hours before your surgery). Hold or take medications as recommended by the hospital staff at your Preoperative visit Stop drinking liquids before you leave the house (>3 hours prior to surgery)   WHAT DO I NEED TO KNOW ABOUT A FULL LIQUID DIET? You may have any liquid. You may have any food that becomes a liquid at room temperature. The food is considered a liquid if it can be poured off a spoon at room temperature. WHAT FOODS CAN I EAT? Grains Any grain food that can be pureed in soup (such as crackers, pasta, and rice). Hot cereal (such as farina or oatmeal) that has been blended.  Fruits Fruit juice, including nectars and juices with pulp. Meats and Other Protein Sources Eggs in custard, eggnog mix, and eggs used in ice cream or pudding. Strained meats, like in baby food, may be allowed. Consult your health care provider.  Dairy Milk and milk-based beverages, including milk shakes and instant breakfast mixes. Smooth yogurt. Pureed cottage cheese. Avoid these foods if they are not well tolerated. Beverages All beverages, including liquid nutritional supplements. Avoid carbonated beverages. Condiments Iodized salt, pepper, spices, and flavorings. Cocoa powder. Vinegar, ketchup, yellow mustard, smooth sauces (such as hollandaise, cheese sauce, or white sauce), and soy sauce. Sweets and Desserts Custard, smooth pudding. Flavored gelatin. Tapioca, junket. Plain ice cream, sherbet, fruit ices. Frozen ice pops, frozen fudge pops, pudding pops, and other frozen bars with cream. Syrups, including chocolate syrup. Sugar, honey, jelly.  Fats and Oils Margarine, butter, cream, sour cream, and oils. Other Broth and cream soups. Strained, broth-based soups. The items listed above may not be a complete list of recommended foods  or  beverages.  WHAT FOODS CAN I NOT EAT? Grains All breads. Grains are not allowed unless they are pureed into soup. Vegetables Vegetables are not allowed unless they are juiced, or cooked and pureed into soup. Fruits Fruits are not allowed unless they are juiced. Meats and Other Protein Sources Any meat or fish. Cooked or raw eggs. Nut butters.  Dairy Cheese.  Condiments Stone ground mustards. Fats and Oils Fats that are coarse or chunky. Sweets and Desserts Ice cream or other frozen desserts that have any solids in them or on top, such as nuts, chocolate chips, and pieces of cookies. Cakes. Cookies. Candy. Others Soups with chunks or pieces in them.

## 2023-07-16 NOTE — Progress Notes (Signed)
Patient here for new patient consultation with Dr. Pricilla Holm and for a pre-operative appointment prior to her scheduled surgery on July 18, 2203. She is scheduled for diagnostic laparoscopy, robotic assisted laparoscopic versus open pelvic mass excision, possible bowel/bladder surgery.  The surgery was discussed in detail.  See after visit summary for additional details.    Discussed post-op pain management in detail including the aspects of the enhanced recovery pathway.  Advised her that a new prescription would be sent in for tramadol and it is only to be used for after her upcoming surgery.  We discussed the use of tylenol post-op and to monitor for a maximum of 4,000 mg in a 24 hour period.  Also prescribed sennakot to be used after surgery and to hold if having loose stools.  Discussed bowel regimen in detail.     Discussed the use of SCDs and measures to take at home to prevent DVT including frequent mobility.  Reportable signs and symptoms of DVT discussed. Post-operative instructions discussed and expectations for after surgery. Incisional care discussed as well including reportable signs and symptoms including erythema, drainage, wound separation.     10 minutes spent with the patient.  Verbalizing understanding of material discussed. No needs or concerns voiced at the end of the visit.   Advised patient to call for any needs.  Advised that her post-operative medications had been prescribed and could be picked up at any time.    This appointment is included in the global surgical bundle as pre-operative teaching and has no charge.

## 2023-07-17 ENCOUNTER — Telehealth: Payer: Self-pay

## 2023-07-17 NOTE — Telephone Encounter (Signed)
Telephone call to check on pre-operative status.  Patient compliant with pre-operative instructions.  Reinforced nothing to eat after midnight. Clear liquids until 0930. Patient to arrive at 10:15.  No questions or concerns voiced.  Instructed to call for any needs.  Bowel prep instructions reviewed with patient,  questions answered

## 2023-07-18 ENCOUNTER — Encounter (HOSPITAL_COMMUNITY)
Admission: RE | Disposition: A | Discharge status: Home / Self Care | Payer: Self-pay | Source: Home / Self Care | Attending: Gynecologic Oncology

## 2023-07-18 ENCOUNTER — Inpatient Hospital Stay (HOSPITAL_COMMUNITY): Payer: Medicare Other

## 2023-07-18 ENCOUNTER — Encounter (HOSPITAL_COMMUNITY): Payer: Self-pay | Admitting: Gynecologic Oncology

## 2023-07-18 ENCOUNTER — Observation Stay (HOSPITAL_COMMUNITY)
Admission: RE | Admit: 2023-07-18 | Discharge: 2023-07-19 | Disposition: A | Discharge status: Home / Self Care | Payer: Medicare Other | Attending: Gynecologic Oncology | Admitting: Gynecologic Oncology

## 2023-07-18 ENCOUNTER — Other Ambulatory Visit: Payer: Self-pay

## 2023-07-18 ENCOUNTER — Inpatient Hospital Stay (HOSPITAL_COMMUNITY): Payer: Medicare Other | Admitting: Physician Assistant

## 2023-07-18 DIAGNOSIS — C561 Malignant neoplasm of right ovary: Secondary | ICD-10-CM

## 2023-07-18 DIAGNOSIS — I10 Essential (primary) hypertension: Secondary | ICD-10-CM | POA: Insufficient documentation

## 2023-07-18 DIAGNOSIS — R1900 Intra-abdominal and pelvic swelling, mass and lump, unspecified site: Secondary | ICD-10-CM | POA: Diagnosis not present

## 2023-07-18 DIAGNOSIS — Z87891 Personal history of nicotine dependence: Secondary | ICD-10-CM

## 2023-07-18 DIAGNOSIS — Z85828 Personal history of other malignant neoplasm of skin: Secondary | ICD-10-CM | POA: Diagnosis not present

## 2023-07-18 DIAGNOSIS — C579 Malignant neoplasm of female genital organ, unspecified: Secondary | ICD-10-CM | POA: Diagnosis present

## 2023-07-18 DIAGNOSIS — D3910 Neoplasm of uncertain behavior of unspecified ovary: Secondary | ICD-10-CM | POA: Diagnosis present

## 2023-07-18 DIAGNOSIS — N9489 Other specified conditions associated with female genital organs and menstrual cycle: Secondary | ICD-10-CM

## 2023-07-18 DIAGNOSIS — J449 Chronic obstructive pulmonary disease, unspecified: Secondary | ICD-10-CM | POA: Diagnosis not present

## 2023-07-18 DIAGNOSIS — Z79899 Other long term (current) drug therapy: Secondary | ICD-10-CM | POA: Insufficient documentation

## 2023-07-18 HISTORY — PX: ROBOTIC ASSISTED LAPAROSCOPIC LYSIS OF ADHESION: SHX6080

## 2023-07-18 LAB — TYPE AND SCREEN
ABO/RH(D): O NEG
Antibody Screen: NEGATIVE

## 2023-07-18 SURGERY — LYSIS, ADHESIONS, ROBOT-ASSISTED, LAPAROSCOPIC
Anesthesia: General | Site: Abdomen

## 2023-07-18 MED ORDER — ALBUTEROL SULFATE HFA 108 (90 BASE) MCG/ACT IN AERS
INHALATION_SPRAY | RESPIRATORY_TRACT | Status: AC
  Filled 2023-07-18: qty 6.7

## 2023-07-18 MED ORDER — LACTATED RINGERS IR SOLN
Status: DC | PRN
  Administered 2023-07-18: 1000 mL

## 2023-07-18 MED ORDER — LIDOCAINE HCL (PF) 2 % IJ SOLN
INTRAMUSCULAR | Status: AC
  Filled 2023-07-18: qty 5

## 2023-07-18 MED ORDER — SENNOSIDES-DOCUSATE SODIUM 8.6-50 MG PO TABS
2.0000 | ORAL_TABLET | Freq: Every day | ORAL | Status: DC
  Filled 2023-07-18: qty 2

## 2023-07-18 MED ORDER — FENTANYL CITRATE (PF) 250 MCG/5ML IJ SOLN
INTRAMUSCULAR | Status: DC | PRN
  Administered 2023-07-18: 50 ug via INTRAVENOUS
  Administered 2023-07-18 (×2): 100 ug via INTRAVENOUS

## 2023-07-18 MED ORDER — STERILE WATER FOR IRRIGATION IR SOLN
Status: DC | PRN
  Administered 2023-07-18: 1000 mL

## 2023-07-18 MED ORDER — ACETAMINOPHEN 500 MG PO TABS
1000.0000 mg | ORAL_TABLET | Freq: Four times a day (QID) | ORAL | Status: DC
  Administered 2023-07-18 – 2023-07-19 (×3): 1000 mg via ORAL
  Filled 2023-07-18 (×3): qty 2

## 2023-07-18 MED ORDER — DEXAMETHASONE SODIUM PHOSPHATE 10 MG/ML IJ SOLN
INTRAMUSCULAR | Status: AC
  Filled 2023-07-18: qty 1

## 2023-07-18 MED ORDER — SODIUM CHLORIDE 0.9 % IV SOLN
2.0000 g | INTRAVENOUS | Status: AC
  Administered 2023-07-18: 2 g via INTRAVENOUS
  Filled 2023-07-18: qty 2

## 2023-07-18 MED ORDER — FENTANYL CITRATE (PF) 250 MCG/5ML IJ SOLN
INTRAMUSCULAR | Status: AC
  Filled 2023-07-18: qty 5

## 2023-07-18 MED ORDER — ONDANSETRON HCL 4 MG PO TABS: 4.0000 mg | ORAL_TABLET | Freq: Four times a day (QID) | ORAL | Status: DC | PRN

## 2023-07-18 MED ORDER — DEXAMETHASONE SODIUM PHOSPHATE 10 MG/ML IJ SOLN
INTRAMUSCULAR | Status: DC | PRN
  Administered 2023-07-18: 8 mg via INTRAVENOUS

## 2023-07-18 MED ORDER — TRAMADOL HCL 50 MG PO TABS: 50.0000 mg | ORAL_TABLET | Freq: Four times a day (QID) | ORAL | Status: DC | PRN

## 2023-07-18 MED ORDER — BUPIVACAINE LIPOSOME 1.3 % IJ SUSP
INTRAMUSCULAR | Status: DC | PRN
  Administered 2023-07-18: 20 mL

## 2023-07-18 MED ORDER — AMISULPRIDE (ANTIEMETIC) 5 MG/2ML IV SOLN: 10.0000 mg | Freq: Once | INTRAVENOUS | Status: DC | PRN

## 2023-07-18 MED ORDER — ORAL CARE MOUTH RINSE: 15.0000 mL | Freq: Once | OROMUCOSAL | Status: AC

## 2023-07-18 MED ORDER — ALBUTEROL SULFATE (2.5 MG/3ML) 0.083% IN NEBU: 2.5000 mg | INHALATION_SOLUTION | Freq: Four times a day (QID) | RESPIRATORY_TRACT | Status: DC | PRN

## 2023-07-18 MED ORDER — FENTANYL CITRATE PF 50 MCG/ML IJ SOSY
25.0000 ug | PREFILLED_SYRINGE | INTRAMUSCULAR | Status: DC | PRN
  Administered 2023-07-18: 50 ug via INTRAVENOUS

## 2023-07-18 MED ORDER — HEPARIN SODIUM (PORCINE) 5000 UNIT/ML IJ SOLN
5000.0000 [IU] | INTRAMUSCULAR | Status: AC
  Administered 2023-07-18: 5000 [IU] via SUBCUTANEOUS
  Filled 2023-07-18: qty 1

## 2023-07-18 MED ORDER — HEMOSTATIC AGENTS (NO CHARGE) OPTIME
TOPICAL | Status: DC | PRN
  Administered 2023-07-18: 1 via TOPICAL

## 2023-07-18 MED ORDER — ARFORMOTEROL TARTRATE 15 MCG/2ML IN NEBU
15.0000 ug | INHALATION_SOLUTION | Freq: Two times a day (BID) | RESPIRATORY_TRACT | Status: DC
  Administered 2023-07-18 – 2023-07-19 (×3): 15 ug via RESPIRATORY_TRACT
  Filled 2023-07-18 (×3): qty 2

## 2023-07-18 MED ORDER — UMECLIDINIUM BROMIDE 62.5 MCG/ACT IN AEPB
1.0000 | INHALATION_SPRAY | Freq: Every day | RESPIRATORY_TRACT | Status: DC
  Administered 2023-07-19: 1 via RESPIRATORY_TRACT
  Filled 2023-07-18: qty 7

## 2023-07-18 MED ORDER — ONDANSETRON HCL 4 MG/2ML IJ SOLN
INTRAMUSCULAR | Status: AC
  Filled 2023-07-18: qty 2

## 2023-07-18 MED ORDER — POVIDONE-IODINE 10 % EX SWAB: 2.0000 | Freq: Once | CUTANEOUS | Status: DC

## 2023-07-18 MED ORDER — FENTANYL CITRATE PF 50 MCG/ML IJ SOSY
PREFILLED_SYRINGE | INTRAMUSCULAR | Status: AC
  Filled 2023-07-18: qty 1

## 2023-07-18 MED ORDER — KETAMINE HCL 50 MG/5ML IJ SOSY
PREFILLED_SYRINGE | INTRAMUSCULAR | Status: AC
  Filled 2023-07-18: qty 5

## 2023-07-18 MED ORDER — PROPOFOL 10 MG/ML IV BOLUS
INTRAVENOUS | Status: AC
  Filled 2023-07-18: qty 20

## 2023-07-18 MED ORDER — ENSURE PRE-SURGERY PO LIQD: 296.0000 mL | Freq: Once | ORAL | Status: DC

## 2023-07-18 MED ORDER — LACTATED RINGERS IV SOLN: INTRAVENOUS | Status: DC

## 2023-07-18 MED ORDER — PROPOFOL 10 MG/ML IV BOLUS
INTRAVENOUS | Status: DC | PRN
  Administered 2023-07-18 (×2): 50 mg via INTRAVENOUS

## 2023-07-18 MED ORDER — HYDROCODONE-ACETAMINOPHEN 5-325 MG PO TABS: 1.0000 | ORAL_TABLET | ORAL | Status: DC | PRN

## 2023-07-18 MED ORDER — ENOXAPARIN SODIUM 40 MG/0.4ML IJ SOSY
40.0000 mg | PREFILLED_SYRINGE | INTRAMUSCULAR | Status: DC
  Administered 2023-07-19: 40 mg via SUBCUTANEOUS
  Filled 2023-07-18: qty 0.4

## 2023-07-18 MED ORDER — ONDANSETRON HCL 4 MG/2ML IJ SOLN: 4.0000 mg | Freq: Four times a day (QID) | INTRAMUSCULAR | Status: DC | PRN

## 2023-07-18 MED ORDER — GLYCOPYRROLATE 0.2 MG/ML IJ SOLN
INTRAMUSCULAR | Status: DC | PRN
  Administered 2023-07-18 (×2): .1 mg via INTRAVENOUS

## 2023-07-18 MED ORDER — ACETAMINOPHEN 500 MG PO TABS
1000.0000 mg | ORAL_TABLET | ORAL | Status: AC
  Administered 2023-07-18: 1000 mg via ORAL
  Filled 2023-07-18: qty 2

## 2023-07-18 MED ORDER — BUPIVACAINE HCL 0.25 % IJ SOLN
INTRAMUSCULAR | Status: DC | PRN
  Administered 2023-07-18: 20 mL
  Administered 2023-07-18: 30 mL

## 2023-07-18 MED ORDER — ONDANSETRON HCL 4 MG/2ML IJ SOLN: 4.0000 mg | Freq: Once | INTRAMUSCULAR | Status: DC | PRN

## 2023-07-18 MED ORDER — LIDOCAINE HCL (PF) 2 % IJ SOLN
INTRAMUSCULAR | Status: DC | PRN
  Administered 2023-07-18: 1.5 mg/kg/h

## 2023-07-18 MED ORDER — ALBUTEROL SULFATE HFA 108 (90 BASE) MCG/ACT IN AERS
INHALATION_SPRAY | RESPIRATORY_TRACT | Status: DC | PRN
  Administered 2023-07-18: 2 via RESPIRATORY_TRACT

## 2023-07-18 MED ORDER — LACTATED RINGERS IV SOLN: INTRAVENOUS | Status: DC | PRN

## 2023-07-18 MED ORDER — LIDOCAINE HCL (CARDIAC) PF 100 MG/5ML IV SOSY
PREFILLED_SYRINGE | INTRAVENOUS | Status: DC | PRN
  Administered 2023-07-18: 60 mg via INTRAVENOUS

## 2023-07-18 MED ORDER — BUPIVACAINE LIPOSOME 1.3 % IJ SUSP
INTRAMUSCULAR | Status: AC
  Filled 2023-07-18: qty 20

## 2023-07-18 MED ORDER — SUGAMMADEX SODIUM 200 MG/2ML IV SOLN
INTRAVENOUS | Status: DC | PRN
  Administered 2023-07-18: 200 mg via INTRAVENOUS

## 2023-07-18 MED ORDER — CHLORHEXIDINE GLUCONATE 0.12 % MT SOLN
15.0000 mL | Freq: Once | OROMUCOSAL | Status: AC
  Administered 2023-07-18: 15 mL via OROMUCOSAL

## 2023-07-18 MED ORDER — OXYCODONE HCL 5 MG PO TABS: 5.0000 mg | ORAL_TABLET | ORAL | Status: DC | PRN

## 2023-07-18 MED ORDER — KCL IN DEXTROSE-NACL 20-5-0.45 MEQ/L-%-% IV SOLN
INTRAVENOUS | Status: DC
  Filled 2023-07-18: qty 1000

## 2023-07-18 MED ORDER — ROCURONIUM BROMIDE 100 MG/10ML IV SOLN
INTRAVENOUS | Status: DC | PRN
  Administered 2023-07-18: 30 mg via INTRAVENOUS
  Administered 2023-07-18: 40 mg via INTRAVENOUS

## 2023-07-18 MED ORDER — LIDOCAINE HCL 2 % IJ SOLN
INTRAMUSCULAR | Status: AC
  Filled 2023-07-18: qty 20

## 2023-07-18 MED ORDER — DEXAMETHASONE SODIUM PHOSPHATE 4 MG/ML IJ SOLN: 4.0000 mg | INTRAMUSCULAR | Status: DC

## 2023-07-18 MED ORDER — BUPIVACAINE HCL 0.25 % IJ SOLN
INTRAMUSCULAR | Status: AC
  Filled 2023-07-18: qty 1

## 2023-07-18 MED ORDER — ONDANSETRON HCL 4 MG/2ML IJ SOLN
INTRAMUSCULAR | Status: DC | PRN
  Administered 2023-07-18: 4 mg via INTRAVENOUS

## 2023-07-18 MED ORDER — ENSURE PRE-SURGERY PO LIQD: 592.0000 mL | Freq: Once | ORAL | Status: DC

## 2023-07-18 MED ORDER — ROCURONIUM BROMIDE 10 MG/ML (PF) SYRINGE
PREFILLED_SYRINGE | INTRAVENOUS | Status: AC
  Filled 2023-07-18: qty 10

## 2023-07-18 MED ORDER — KETAMINE HCL 10 MG/ML IJ SOLN
INTRAMUSCULAR | Status: DC | PRN
  Administered 2023-07-18: 5 mg via INTRAVENOUS
  Administered 2023-07-18: 25 mg via INTRAVENOUS
  Administered 2023-07-18: 10 mg via INTRAVENOUS

## 2023-07-18 MED ORDER — PHENYLEPHRINE HCL-NACL 20-0.9 MG/250ML-% IV SOLN
INTRAVENOUS | Status: DC | PRN
  Administered 2023-07-18: 50 ug/min via INTRAVENOUS

## 2023-07-18 MED ORDER — GLYCOPYRROLATE 0.2 MG/ML IJ SOLN
INTRAMUSCULAR | Status: AC
  Filled 2023-07-18: qty 1

## 2023-07-18 SURGICAL SUPPLY — 88 items
ADH SKN CLS APL DERMABOND .7 (GAUZE/BANDAGES/DRESSINGS) ×2
AGENT HMST KT MTR STRL THRMB (HEMOSTASIS)
APL ESCP 34 STRL LF DISP (HEMOSTASIS)
APL SRG 38 LTWT LNG FL B (MISCELLANEOUS) ×1
APPLICATOR ARISTA FLEXITIP XL (MISCELLANEOUS) IMPLANT
APPLICATOR SURGIFLO ENDO (HEMOSTASIS) IMPLANT
BAG COUNTER SPONGE SURGICOUNT (BAG) IMPLANT
BAG LAPAROSCOPIC 12 15 PORT 16 (BASKET) IMPLANT
BAG RETRIEVAL 12/15 (BASKET) ×1
BAG SPNG CNTER NS LX DISP (BAG)
BLADE SURG SZ10 CARB STEEL (BLADE) IMPLANT
COVER BACK TABLE 60X90IN (DRAPES) ×2 IMPLANT
COVER TIP SHEARS 8 DVNC (MISCELLANEOUS) ×2 IMPLANT
DERMABOND ADVANCED .7 DNX12 (GAUZE/BANDAGES/DRESSINGS) ×2 IMPLANT
DRAIN CHANNEL 15F RND FF 3/16 (WOUND CARE) IMPLANT
DRAIN RELI 100 BL SUC LF ST (DRAIN) ×1
DRAPE ARM DVNC X/XI (DISPOSABLE) ×8 IMPLANT
DRAPE COLUMN DVNC XI (DISPOSABLE) ×2 IMPLANT
DRAPE SHEET LG 3/4 BI-LAMINATE (DRAPES) ×2 IMPLANT
DRAPE SURG IRRIG POUCH 19X23 (DRAPES) ×2 IMPLANT
DRIVER NDL MEGA SUTCUT DVNCXI (INSTRUMENTS) ×2 IMPLANT
DRIVER NDLE MEGA SUTCUT DVNCXI (INSTRUMENTS) ×1
DRSG OPSITE POSTOP 4X6 (GAUZE/BANDAGES/DRESSINGS) IMPLANT
DRSG OPSITE POSTOP 4X8 (GAUZE/BANDAGES/DRESSINGS) IMPLANT
DRSG TEGADERM 4X4.75 (GAUZE/BANDAGES/DRESSINGS) IMPLANT
ELECT PENCIL ROCKER SW 15FT (MISCELLANEOUS) IMPLANT
ELECT REM PT RETURN 15FT ADLT (MISCELLANEOUS) ×2 IMPLANT
EVACUATOR SILICONE 100CC (DRAIN) IMPLANT
FORCEPS BPLR FENES DVNC XI (FORCEP) ×2 IMPLANT
FORCEPS PROGRASP DVNC XI (FORCEP) ×2 IMPLANT
GAUZE 4X4 16PLY ~~LOC~~+RFID DBL (SPONGE) ×4 IMPLANT
GAUZE SPONGE 2X2 8PLY STRL LF (GAUZE/BANDAGES/DRESSINGS) IMPLANT
GLOVE BIO SURGEON STRL SZ 6 (GLOVE) ×8 IMPLANT
GLOVE BIO SURGEON STRL SZ 6.5 (GLOVE) ×2 IMPLANT
GOWN STRL REUS W/ TWL LRG LVL3 (GOWN DISPOSABLE) ×8 IMPLANT
GOWN STRL REUS W/TWL LRG LVL3 (GOWN DISPOSABLE) ×4
GRASPER SUT TROCAR 14GX15 (MISCELLANEOUS) IMPLANT
HEMOSTAT ARISTA ABSORB 3G PWDR (HEMOSTASIS) IMPLANT
HOLDER FOLEY CATH W/STRAP (MISCELLANEOUS) IMPLANT
IRRIG SUCT STRYKERFLOW 2 WTIP (MISCELLANEOUS) ×1
IRRIGATION SUCT STRKRFLW 2 WTP (MISCELLANEOUS) ×2 IMPLANT
KIT PROCEDURE DVNC SI (MISCELLANEOUS) IMPLANT
KIT TURNOVER KIT A (KITS) IMPLANT
LIGASURE IMPACT 36 18CM CVD LR (INSTRUMENTS) IMPLANT
MANIPULATOR ADVINCU DEL 3.0 PL (MISCELLANEOUS) IMPLANT
MANIPULATOR ADVINCU DEL 3.5 PL (MISCELLANEOUS) IMPLANT
MANIPULATOR UTERINE 4.5 ZUMI (MISCELLANEOUS) IMPLANT
NDL HYPO 21X1.5 SAFETY (NEEDLE) ×2 IMPLANT
NDL SPNL 18GX3.5 QUINCKE PK (NEEDLE) IMPLANT
NEEDLE HYPO 21X1.5 SAFETY (NEEDLE) ×2
NEEDLE SPNL 18GX3.5 QUINCKE PK (NEEDLE)
OBTURATOR OPTICAL STND 8 DVNC (TROCAR) ×1
OBTURATOR OPTICALSTD 8 DVNC (TROCAR) ×2 IMPLANT
PACK ROBOT GYN CUSTOM WL (TRAY / TRAY PROCEDURE) ×2 IMPLANT
PAD POSITIONING PINK XL (MISCELLANEOUS) ×2 IMPLANT
PORT ACCESS TROCAR AIRSEAL 12 (TROCAR) IMPLANT
RTRCTR WOUND ALEXIS 18CM SML (INSTRUMENTS) ×1
SAVER CELL AAL HAEMONETICS (INSTRUMENTS) IMPLANT
SCISSORS MNPLR CVD DVNC XI (INSTRUMENTS) ×2 IMPLANT
SCRUB CHG 4% DYNA-HEX 4OZ (MISCELLANEOUS) IMPLANT
SEAL UNIV 5-12 XI (MISCELLANEOUS) ×8 IMPLANT
SET TRI-LUMEN FLTR TB AIRSEAL (TUBING) ×2 IMPLANT
SPIKE FLUID TRANSFER (MISCELLANEOUS) ×2 IMPLANT
SPONGE T-LAP 18X18 ~~LOC~~+RFID (SPONGE) IMPLANT
SURGIFLO W/THROMBIN 8M KIT (HEMOSTASIS) IMPLANT
SUT ETHILON 2 0 PS N (SUTURE) IMPLANT
SUT MNCRL AB 4-0 PS2 18 (SUTURE) IMPLANT
SUT PDS AB 1 TP1 96 (SUTURE) IMPLANT
SUT V-LOC 180 0-0 GS22 (SUTURE) IMPLANT
SUT VIC AB 0 CT1 27 (SUTURE)
SUT VIC AB 0 CT1 27XBRD ANTBC (SUTURE) IMPLANT
SUT VIC AB 2-0 CT1 27 (SUTURE) ×1
SUT VIC AB 2-0 CT1 TAPERPNT 27 (SUTURE) IMPLANT
SUT VIC AB 4-0 PS2 18 (SUTURE) ×4 IMPLANT
SUT VICRYL 0 27 CT2 27 ABS (SUTURE) ×2 IMPLANT
SUT VLOC 180 0 9IN GS21 (SUTURE) IMPLANT
SYR 10ML LL (SYRINGE) IMPLANT
SYS BAG RETRIEVAL 10MM (BASKET)
SYS WOUND ALEXIS 18CM MED (MISCELLANEOUS)
SYSTEM BAG RETRIEVAL 10MM (BASKET) IMPLANT
SYSTEM WOUND ALEXIS 18CM MED (MISCELLANEOUS) IMPLANT
TOWEL OR NON WOVEN STRL DISP B (DISPOSABLE) IMPLANT
TRAP SPECIMEN MUCUS 40CC (MISCELLANEOUS) IMPLANT
TRAY FOLEY MTR SLVR 16FR STAT (SET/KITS/TRAYS/PACK) ×2 IMPLANT
TROCAR PORT AIRSEAL 5X120 (TROCAR) IMPLANT
UNDERPAD 30X36 HEAVY ABSORB (UNDERPADS AND DIAPERS) ×4 IMPLANT
WATER STERILE IRR 1000ML POUR (IV SOLUTION) ×2 IMPLANT
YANKAUER SUCT BULB TIP 10FT TU (MISCELLANEOUS) IMPLANT

## 2023-07-18 NOTE — Brief Op Note (Signed)
07/18/2023  4:42 PM  PATIENT:  Tiffany [name]  77 y.o. female  PRE-OPERATIVE DIAGNOSIS:  PELVIC MASS  POST-OPERATIVE DIAGNOSIS:  PELVIC MASS  PROCEDURE:  Procedure(s): ROBOTIC ASSISTED PELVIC MASS EXCISION WITH RADICAL DISSECTION, MINI LAPAROTOMY, CYSTOSCOPY (N/A)  SURGEON:  Surgeons and Role:    Carver Fila, MD - Primary  ASSISTANTS: Warner Mccreedy, NP   ANESTHESIA:   general  EBL:  100 mL   BLOOD ADMINISTERED:none  DRAINS: 10 JP   LOCAL MEDICATIONS USED:  0.25% marcaine, exparel  SPECIMEN:  anterior pelvic mass  DISPOSITION OF SPECIMEN:  PATHOLOGY  COUNTS:  YES  TOURNIQUET:  * No tourniquets in log *  DICTATION: .Note written in EPIC  PLAN OF CARE: Admit for overnight observation  PATIENT DISPOSITION:  PACU - hemodynamically stable.   Delay start of Pharmacological VTE agent (>24hrs) due to surgical blood loss or risk of bleeding: no

## 2023-07-18 NOTE — Op Note (Signed)
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 On intra-abdominal entry, normal upper abdominal survey. Surgically absent omentum. Normal small and large bowel. Several loops of small bowel with adhesions to the pelvic sidewalls and vaginal cuff. The loops of bowel on the right, just posterior to the mass were freed sharply. A 12 cm retroperitoneal mass was noted within the space of retzius, spanning 4-6 cm above the pubic symphysis. The mass was densely adherent to the anterior abdominal wall (requiring excision of some of the peritoneum and underlying muscle), the pubic symphysis and the anterior bladder. Some muscular fibers noted during dissection of the mass from the anterior surface of the bladder without mucosal injury. The bladder was backfilled after the mass removed and water tight with 250 cc of fluid. Cystoscopy was performed with bubble at the dome and good efflux from bilateral ureteral orifices. Minimal spillage of mass contents during radical dissection, pelvis copiously irrigated at the end of surgery.  Estimated Blood Loss:  100 cc      Total IV Fluids: see I&O flowsheet         Specimens: pelvic washings, pelvic mass         Complications:  None apparent; patient tolerated the procedure well.         Disposition: PACU - hemodynamically stable.  Procedure Details  The patient was seen in the Holding Room. The risks, benefits, complications, treatment options, and expected outcomes were discussed with the patient.  The patient concurred with the  proposed plan, giving informed consent.  The site of surgery properly noted/marked. The patient was identified as Management consultant and the procedure verified as a Robotic-assisted vs. Open pelvic mass excision.   After induction of anesthesia, the patient was draped and prepped in the usual sterile manner. Patient was placed in supine position after anesthesia and draped and prepped in the usual sterile manner as follows: Her arms were tucked to her side with all appropriate precautions.  The patient was secured to the bed using padding and tape across her chest.  The patient was placed in the semi-lithotomy position in [place] stirrups.  The perineum and vagina were prepped with CHG. The patient's abdomen was prepped with ChloraPrep and then she was draped after the prep had been allowed to dry for 3 minutes.  A Time Out was held and the above information confirmed.  The urethra was prepped with Betadine. Foley catheter was placed.  OG tube placement was confirmed and to suction.   Next, a 10 mm skin incision was made 1 cm below the subcostal margin in the midclavicular line.  The 5 mm Optiview port and scope was used for direct entry.  Opening pressure was under 10 mm CO2.  The abdomen was insufflated and the findings were noted as above.   At this point and all points during the procedure, the patient's intra-abdominal pressure did not exceed 15 mmHg. Next, an 8 mm skin incision was made superior to the umbilicus and a right and left port were placed about 8 cm lateral to the robot port on the right and left side.  A fourth arm  was placed on the right.  The 5 mm assist trocar was exchanged for a 12 mm airseal port. All ports were placed under direct visualization.  The patient was placed in steep Trendelenburg.  The robot was docked in the normal manner.  Washings were collected.  Attention was turned to the deep pelvis where several loops of small bowel were adherent to the right pelvic sidewall.  These mostly  filmy adhesions were lysed sharply.  Attention was then turned anteriorly.  The mass was noted to be completely retroperitonealized.  Monopolar electrocautery was used to start the dissection anteriorly.  A combination of sharp dissection, blunt dissection, and monopolar electrocautery were used to slowly mobilize the mass from the anterior abdominal wall.  Given its adherence to the peritoneum, peritoneum was resected as well as small amount of the underlying muscles along the midline (pyramidalis).  Attention was then turned posteriorly and with some upward traction on the mass, incision in the peritoneum covering the mass was made at the superior aspect of the mass.  With downward traction on this peritoneum, similar slow, methodical dissection with sharp dissection, monopolar electrocautery, and blunt dissection was used to mobilize the underlying bladder from the posterior aspect of the mass.  A 30 degree robotic camera was used during this dissection.  During the dissection, some muscle fibers of the bladder were noted although no trans muscular injury nor mucosal injury noted.  Once most of the posterior patient had been completed, attention was turned to the sides.  With gentle traction on the mass to either side, the dissection was continued towards the anterior pelvis.  Attention was then turned back to the anterior portion of the mass.  With some of the manipulation anteriorly, there were 2 small holes made in the mass with minimal drainage of tumor that was sucked out immediately.  The mass was dissected free from the remaining portion of the pyramidalis.  It was noted to be densely adherent to the superior aspect of the pubic symphysis requiring monopolar electrocautery to dissect the mass free from the bone.  Once the mass was freed from the pubic symphysis, continued dissection using blunt dissection and monopolar electrocautery was used ultimately to free the mass completely.  The mass was elevated  and placed in a 15 mm Endo Catch bag inserted through the assist trocar.  The anterior bladder was examined and although there was some disruption of muscular fibers, no through and through injury noted.  At this time, cystoscopy was performed.  The bladder was backfilled with approximately 250 cc of sterile fluid and the Foley catheter was removed.  On cystoscopy, the bladder dome was intact.  There was no spillage of fluid within the abdominal cavity suggesting a leak.  Bubble was noted at the dome.  Good efflux noted from bilateral ureteral orifices.  The catheter was replaced after cystoscopy finish.  Attention was turned back to the abdomen.  At this time I called urologist on call (Dr. Cardell Peach) given radical dissection and some damage to the serosa and muscularis of the bladder anteriorly.  Given watertight test at the time of cystoscopy, he recommended drain placement and checking a JP drain creatinine in the morning.  The pelvis was copiously irrigated with good hemostasis noted.  Small hematoma was noted along the dome of the bladder between the bladder and the peritoneum, not enlarging.  Arista was placed within the surgical bed.  A 10 JP drain was brought in through the right lateral trocar and  placed within the pelvis.  At this point in the procedure was completed.  Robotic instruments were removed under direct visulaization.  The robot was undocked. The fascia at the 10-12 mm port was closed with 0 Vicryl using a PMI fascial closure device.    A drain stitch was used to secure the drain at the RLQ port site.  The supraumbilical trocar was removed and the incision extended 8 cm with a scalpel. The incision was carried down to and through the fascia, with the abdomen insufflated, using monopolar electrocautery. The peritoneal incision was extended under direct visualization. The endocatch bag with the adnexal specimen was brought out through the incision. A small alexis retractor was placed within  the bag to hold it open. The mass was then morcellated in contained fashion to remove it from the bag. The bag was verified to be intact after its removal. The incision was then closed with running #1 looped PDS tied in the midline. The subcutaneous tissue was irrigated and hemostasis achieved. Exparel was injected for local anesthesia. The subcutaneous tissue was closed with 2-0 Vicyrl in running fashion.   The fascia at the 10-12 mm port was closed with 0 Vicryl on a UR-5 needle.  The subcuticular tissue of all incisions was closed with 4-0 Vicryl and the skin was closed with 4-0 Monocryl in a subcuticular manner.  Dermabond was applied.    The subcuticular tissue was closed with 4-0 Vicryl and the skin was closed with 4-0 Monocryl in a subcuticular manner.  Dermabond was applied.  A honeycomb dressing was applied to the mini-lap site.  All sponge, lap and needle counts were correct x  3.   The patient was transferred to the recovery room in stable condition.  Eugene Garnet, MD

## 2023-07-18 NOTE — Anesthesia Postprocedure Evaluation (Signed)
Anesthesia Post Note  Patient: Tiffany [name]  Procedure(s) Performed: ROBOTIC ASSISTED PELVIC MASS EXCISION WITH RADICAL DISSECTION, MINI LAPAROTOMY, CYSTOSCOPY (Abdomen)     Patient location during evaluation: PACU Anesthesia Type: General Level of consciousness: awake Pain management: pain level controlled Vital Signs Assessment: post-procedure vital signs reviewed and stable Respiratory status: spontaneous breathing, nonlabored ventilation and respiratory function stable Cardiovascular status: blood pressure returned to baseline and stable Postop Assessment: no apparent nausea or vomiting Anesthetic complications: no   No notable events documented.  Last Vitals:  Vitals:   07/18/23 1800 07/18/23 1832  BP: (!) 140/58 (!) 154/61  Pulse: 63 61  Resp:  16  Temp:  36.6 C  SpO2: 95% 100%    Last Pain:  Vitals:   07/18/23 1832  TempSrc: Oral  PainSc:                  Catheryn Bacon Camilah Spillman

## 2023-07-18 NOTE — Anesthesia Procedure Notes (Signed)
Procedure Name: Intubation Date/Time: 07/18/2023 12:51 PM  Performed by: Lezlie Lye, CRNAPre-anesthesia Checklist: Patient identified, Emergency Drugs available, Suction available and Patient being monitored Patient Re-evaluated:Patient Re-evaluated prior to induction Oxygen Delivery Method: Circle System Utilized Preoxygenation: Pre-oxygenation with 100% oxygen Induction Type: IV induction Ventilation: Mask ventilation without difficulty Laryngoscope Size: Miller and 3 Grade View: Grade I Tube type: Oral Tube size: 7.5 mm Number of attempts: 1 Airway Equipment and Method: Stylet and Oral airway Placement Confirmation: ETT inserted through vocal cords under direct vision, positive ETCO2 and breath sounds checked- equal and bilateral Secured at: 21 cm Tube secured with: Tape Dental Injury: Teeth and Oropharynx as per pre-operative assessment

## 2023-07-18 NOTE — Discharge Instructions (Addendum)
AFTER SURGERY INSTRUCTIONS   Return to work: 4-6 weeks if applicable   You may have a white honeycomb dressing over your larger incision. This dressing can be removed 5 days after surgery and you do not need to reapply a new dressing. Once you remove the dressing, you will notice that you have the surgical glue (dermabond) on the incision and this will peel off on its own. You can get this dressing wet in the shower the days after surgery prior to removal on the 5th day.   YOU ARE BEING DISCHARGED HOME WITH THE FOLEY CATHETER IN PLACE IN YOUR BLADDER. THIS WILL STAY IN PLACE FOR 2 WEEKS AFTER SURGERY THEN WE WILL HAVE YOU COME TO THE OFFICE FOR REMOVAL. You will need to empty this on a regular basis.  YOU ARE ALSO BEING DISCHARGED HOME WITH THE JP ABDOMINAL DRAIN IN PLACE. WE WILL HAVE YOU COME BACK TO THE OFFICE ON TUESDAY OF NEXT WEEK FOR A JP DRAIN FLUID CHECK WITH POSSIBLE REMOVAL. YOU WILL NEED TO EMPTY AND RECORD THE OUTPUT FROM THE JP DRAIN AT HOME.  IF YOU ARE DISCHARGED ON 07/19/2023: PLAN ON STARTING ELIQUIS (BLOOD THINNER) July 20, 2023 in the am since you received the blood thinner injection lovenox before leaving the hospital.   YOU WILL TAKE ELIQUIS 2.5 MG TWICE DAILY. THIS IS A BLOOD THINNER TO PREVENT BLOOD CLOTS SO YOU WILL NEED TO MONITOR FOR BLEEDING AND CALL THE OFFICE.  AVOID USE OF NSAIDS (IBUPROFEN, NAPROXEN) WHILE TAKING THE BLOOD THINNER.   Activity: 1. Be up and out of the bed during the day.  Take a nap if needed.  You may walk up steps but be careful and use the hand rail.  Stair climbing will tire you more than you think, you may need to stop part way and rest.    2. No lifting or straining for 6 weeks over 10 pounds. No pushing, pulling, straining for 6 weeks.   3. No driving for around 1-2 week(s), based on procedure performed.  Do not drive if you are taking narcotic pain medicine and make sure that your reaction time has returned.    4. You can shower as soon  as the next day after surgery. Shower daily.  Use your regular soap and water (not directly on the incision) and pat your incision(s) dry afterwards; don't rub.  No tub baths or submerging your body in water until cleared by your surgeon. If you have the soap that was given to you by pre-surgical testing that was used before surgery, you do not need to use it afterwards because this can irritate your incisions.    5. No sexual activity and nothing in the vagina for 4-6 weeks.   6. You may experience a small amount of clear drainage from your incisions, which is normal.  If the drainage persists, increases, or changes color please call the office.   7. Do not use creams, lotions, or ointments such as neosporin on your incisions after surgery until advised by your surgeon because they can cause removal of the dermabond glue on your incisions.     8. Take Tylenol first for pain if you are able to take these medication and only use narcotic pain medication for severe pain not relieved by the Tylenol.  Monitor your Tylenol intake to a max of 4,000 mg in a 24 hour period.    Diet: 1. Low sodium Heart Healthy Diet is recommended but you are cleared  to resume your normal (before surgery) diet after your procedure.   2. It is safe to use a laxative, such as Miralax or Colace, if you have difficulty moving your bowels. You have been prescribed Sennakot-S to take at bedtime every evening after surgery to keep bowel movements regular and to prevent constipation.     Wound Care: 1. Keep clean and dry.  Shower daily.   Reasons to call the Doctor: Fever - Oral temperature greater than 100.4 degrees Fahrenheit Foul-smelling vaginal discharge Difficulty urinating Nausea and vomiting Increased pain at the site of the incision that is unrelieved with pain medicine. Difficulty breathing with or without chest pain New calf pain especially if only on one side Sudden, continuing increased vaginal bleeding with or  without clots.   Contacts: For questions or concerns you should contact:   Dr. Eugene Garnet at [phone]   Warner Mccreedy, NP at [phone]   After Hours: call [phone] and have the GYN Oncologist paged/contacted (after 5 pm or on the weekends). You will speak with an after hours RN and let he or she know you have had surgery.   Messages sent via mychart are for non-urgent matters and are not responded to after hours so for urgent needs, please call the after hours number.

## 2023-07-18 NOTE — Interval H&P Note (Signed)
History and Physical Interval Note:  07/18/2023 10:54 AM  Tiffany [name]  has presented today for surgery, with the diagnosis of PELVIC MASS.  The various methods of treatment have been discussed with the patient and family. After consideration of risks, benefits and other options for treatment, the patient has consented to  Procedure(s): LAPAROSCOPY DIAGNOSTIC/ POSSIBLE/XI ROBOTIC ASSISTED VERSED OPEN PELVIC MASS EXCISION,POSSIBLE BOWEL AND OR BLADDER SURGERY (N/A) as a surgical intervention.  The patient's history has been reviewed, patient examined, no change in status, stable for surgery.  I have reviewed the patient's chart and labs.  Questions were answered to the patient's satisfaction.     Carver Fila

## 2023-07-18 NOTE — Transfer of Care (Signed)
Immediate Anesthesia Transfer of Care Note  Patient: Tiffany [name]  Procedure(s) Performed: ROBOTIC ASSISTED PELVIC MASS EXCISION WITH RADICAL DISSECTION, MINI LAPAROTOMY, CYSTOSCOPY (Abdomen)  Patient Location: PACU  Anesthesia Type:General  Level of Consciousness: awake and drowsy  Airway & Oxygen Therapy: Patient Spontanous Breathing and Patient connected to face mask oxygen  Post-op Assessment: Report given to RN and Post -op Vital signs reviewed and stable  Post vital signs: Reviewed and stable  Last Vitals:  Vitals Value Taken Time  BP 149/104 07/18/23 1649  Temp 36.5 C 07/18/23 1649  Pulse 69 07/18/23 1651  Resp 16 07/18/23 1651  SpO2 100 % 07/18/23 1651  Vitals shown include unfiled device data.  Last Pain:  Vitals:   07/18/23 1035  TempSrc: Oral         Complications: No notable events documented.

## 2023-07-18 NOTE — Anesthesia Preprocedure Evaluation (Addendum)
Anesthesia Evaluation  Patient identified by MRN, date of birth, ID band Patient awake    Reviewed: Allergy & Precautions, NPO status , Patient's Chart, lab work & pertinent test results  Airway Mallampati: I  TM Distance: >3 FB Neck ROM: Full    Dental  (+) Edentulous Lower, Edentulous Upper   Pulmonary COPD (2L [place] at night),  COPD inhaler and oxygen dependent, former smoker   Pulmonary exam normal        Cardiovascular hypertension, Pt. on medications Normal cardiovascular exam     Neuro/Psych negative neurological ROS  negative psych ROS   GI/Hepatic negative GI ROS, Neg liver ROS,,,  Endo/Other  negative endocrine ROS    Renal/GU negative Renal ROS     Musculoskeletal  (+) Arthritis ,    Abdominal   Peds  Hematology negative hematology ROS (+)   Anesthesia Other Findings PELVIC MASS  Reproductive/Obstetrics                             Anesthesia Physical Anesthesia Plan  ASA: 4  Anesthesia Plan: General   Post-op Pain Management:    Induction: Intravenous  PONV Risk Score and Plan: 3 and Ondansetron, Dexamethasone and Treatment may vary due to age or medical condition  Airway Management Planned: Oral ETT  Additional Equipment:   Intra-op Plan:   Post-operative Plan: Extubation in OR  Informed Consent: I have reviewed the patients History and Physical, chart, labs and discussed the procedure including the risks, benefits and alternatives for the proposed anesthesia with the patient or authorized representative who has indicated his/her understanding and acceptance.     Dental advisory given  Plan Discussed with: CRNA  Anesthesia Plan Comments:        Anesthesia Quick Evaluation

## 2023-07-19 ENCOUNTER — Inpatient Hospital Stay (HOSPITAL_COMMUNITY): Payer: Medicare Other

## 2023-07-19 ENCOUNTER — Encounter (HOSPITAL_COMMUNITY): Payer: Self-pay | Admitting: Gynecologic Oncology

## 2023-07-19 DIAGNOSIS — Z85828 Personal history of other malignant neoplasm of skin: Secondary | ICD-10-CM | POA: Diagnosis not present

## 2023-07-19 DIAGNOSIS — Z79899 Other long term (current) drug therapy: Secondary | ICD-10-CM | POA: Diagnosis not present

## 2023-07-19 DIAGNOSIS — R1900 Intra-abdominal and pelvic swelling, mass and lump, unspecified site: Secondary | ICD-10-CM | POA: Diagnosis not present

## 2023-07-19 DIAGNOSIS — I10 Essential (primary) hypertension: Secondary | ICD-10-CM | POA: Diagnosis not present

## 2023-07-19 DIAGNOSIS — T797XXA Traumatic subcutaneous emphysema, initial encounter: Secondary | ICD-10-CM | POA: Diagnosis not present

## 2023-07-19 DIAGNOSIS — R918 Other nonspecific abnormal finding of lung field: Secondary | ICD-10-CM | POA: Diagnosis not present

## 2023-07-19 LAB — CBC
HCT: 39.6 % (ref 36.0–46.0)
Hemoglobin: 12.6 g/dL (ref 12.0–15.0)
MCH: 30.1 pg (ref 26.0–34.0)
MCHC: 31.8 g/dL (ref 30.0–36.0)
MCV: 94.7 fL (ref 80.0–100.0)
Platelets: 166 10*3/uL (ref 150–400)
RBC: 4.18 MIL/uL (ref 3.87–5.11)
RDW: 12.8 % (ref 11.5–15.5)
WBC: 8 10*3/uL (ref 4.0–10.5)
nRBC: 0 % (ref 0.0–0.2)

## 2023-07-19 LAB — BASIC METABOLIC PANEL
Anion gap: 7 (ref 5–15)
BUN: 13 mg/dL (ref 8–23)
CO2: 27 mmol/L (ref 22–32)
Calcium: 8.6 mg/dL — ABNORMAL LOW (ref 8.9–10.3)
Chloride: 101 mmol/L (ref 98–111)
Creatinine, Ser: 0.73 mg/dL (ref 0.44–1.00)
GFR, Estimated: >60 mL/min (ref >60)
Glucose, Bld: 168 mg/dL — ABNORMAL HIGH (ref 70–99)
Potassium: 4.9 mmol/L (ref 3.5–5.1)
Sodium: 135 mmol/L (ref 135–145)

## 2023-07-19 LAB — CREATININE, FLUID (PLEURAL, PERITONEAL, JP DRAINAGE): Creat, Fluid: 0.6 mg/dL

## 2023-07-19 LAB — HEMOGLOBIN AND HEMATOCRIT, BLOOD
HCT: 39.1 % (ref 36.0–46.0)
Hemoglobin: 12.5 g/dL (ref 12.0–15.0)

## 2023-07-19 LAB — CYTOLOGY - NON PAP

## 2023-07-19 MED ORDER — ALBUTEROL SULFATE HFA 108 (90 BASE) MCG/ACT IN AERS: 2.0000 | INHALATION_SPRAY | Freq: Four times a day (QID) | RESPIRATORY_TRACT | 1 refills | Status: DC | PRN

## 2023-07-19 MED ORDER — APIXABAN 2.5 MG PO TABS: 2.5000 mg | ORAL_TABLET | Freq: Two times a day (BID) | ORAL | 0 refills | Status: DC

## 2023-07-19 NOTE — Plan of Care (Signed)
  Problem: Health Behavior/Discharge Planning: Goal: Ability to manage health-related needs will improve Outcome: Progressing   Problem: Clinical Measurements: Goal: Ability to maintain clinical measurements within normal limits will improve Outcome: Progressing Goal: Will remain free from infection Outcome: Progressing   

## 2023-07-19 NOTE — Care Management CC44 (Signed)
Condition Code 44 Documentation Completed  Patient Details  Name: Tiffany [name] MRN: [id number] Date of Birth: [birth year]   Condition Code 44 given:  Yes Patient signature on Condition Code 44 notice:  Yes Documentation of 2 MD's agreement:  Yes Code 44 added to claim:  Yes    Lavenia Atlas, RN 07/19/2023, 6:45 PM

## 2023-07-19 NOTE — Progress Notes (Addendum)
GYN Oncology Progress Note  Patient is alert and oriented sitting on the side of the bed having orthostatic vital signs taken at this time.  She states she ambulated in the hall with assistance.  She reports dizziness and nausea initially that resolved.  She felt steady on her feet.  The mobility specialist stated the patient had to take a break due to fatigue during ambulation.  She is tolerating her diet with no nausea or emesis but reports a decreased appetite.  No flatus reported.  Abdominal pain manageable. O2 sat at 90% on 2L O2 with ambulating. Per RN, O2 sat at 80% after pt received breathing treatment earlier today and was off oxygen. Patient denies shortness of breath. Preop, she was wearing O2 at bedtime and she has a way to check her saturations at home.   Alert, oriented, in no acute distress. Lungs clear, diminished in the bases. Heart regular in rate and rhythm-bradycardic. Abdomen soft with active bowel sounds. SCDs on. JP charged with minimal amount of serosanguinous drainage. Foley with clear, yellow urine.  Orthostatic vitals signs without abnormality. O2 sat at 94-95%, at 92% when standing. Plan for repeat H&H this afternoon to assess for stability. Chest xray given drop in O2. Push fluids and frequent IS use. Continue plan of care. Dr. Pricilla Holm aware of the above findings and agreeable with the plan.

## 2023-07-19 NOTE — Progress Notes (Signed)
Mobility Specialist - Progress Note   07/19/23 1322  Oxygen Therapy  SpO2 90 %  O2 Device Nasal Cannula  O2 Flow Rate (L/min) 2 L/min  Patient Activity (if Appropriate) Ambulating  Mobility  Activity Ambulated with assistance in hallway  Level of Assistance Standby assist, set-up cues, supervision of patient - no hands on  Assistive Device Front wheel walker  Distance Ambulated (ft) 250 ft  Activity Response Tolerated well  Mobility Referral Yes  $Mobility charge 1 Mobility  Mobility Specialist Start Time (ACUTE ONLY) 1255  Mobility Specialist Stop Time (ACUTE ONLY) 1321  Mobility Specialist Time Calculation (min) (ACUTE ONLY) 26 min   Pt received in bed and agreeable to mobility. Pt required minA from supine>sitting & STS. After ambulating ~137ft pt took a standing rest break due to fatigue. C/o pain on R side where drain is. Upon returning to room assisted pt w/ brushing teeth at EOB. No other complaints during session. Pt to bed after session with all needs met. Bed alarm on.   Pre-mobility: 97% SpO2 (2L [place]) During mobility: 90% SpO2 (2L [place]) Post-mobility: 94% SPO2 (2L [place])  Chief Technology Officer

## 2023-07-19 NOTE — Progress Notes (Signed)
The proposed treatment discussed in conference is for discussion purpose only and is not a binding recommendation.  The patients have not been physically examined, or presented with their treatment options.  Therefore, final treatment plans cannot be decided.  

## 2023-07-19 NOTE — Discharge Summary (Signed)
Physician Discharge Summary  [name] MRN: [id number] DOB/AGE: Jan 22, 1946 77 y.o.  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    Hospital Course: On 07/18/2023, the patient underwent the following: Procedure(s): ROBOTIC ASSISTED PELVIC  MASS EXCISION WITH RADICAL DISSECTION, MINI LAPAROTOMY, CYSTOSCOPY. The postoperative course on POD 1 included stable lab work, chest xray given episode of desaturation with result at baseline with no acute abnormalities, JP and foley teaching, dizziness initially with ambulation that resolved.  She was discharged to home on postoperative day 1 tolerating a regular diet, ambulating with assistive devices (pt uses cane and has walker at home as well), pain minimal, respiratory status at baseline.   She was discharged home with the JP drain in place (probable removal on upcoming Tuesday if JP creatinine normal) and foley in place (plan for removal 2 weeks post-op given significant bladder dissection at the time of surgery). She was educated on care of these and given supplies. She was advised to increase use of IS at home. She has home oxygen she wears at nighttime and can check her oxygen saturations at home. She is advised to monitor her levels and to use the oxygen at home during the day if needed with guidelines she follows at nighttime. She is also being discharged home on DVT prophylaxis in the form of Eliquis for a total of 2 weeks post-op. She is aware of this. Dr. Pricilla Holm updated on the patient's current situation and plan throughout the day.   Consults: None  Significant Diagnostic Studies: Labs, chest xray  Treatments: Surgery: see above, respiratory  Discharge  Exam (midday assessment): Blood pressure (!) 112/58, pulse (!) 58, temperature 98.3 F (36.8 C), temperature source Oral, resp. rate 14, height 5\' 7"  (1.702 m), weight 144 lb (65.3 kg), SpO2 96%. Alert, oriented, in no acute distress.  Lungs clear, diminished in the bases. No crackles or rhonchi. Heart regular in rate and rhythm-mildly bradycardic (around baseline per pt). Abdomen soft with active bowel sounds. SCDs on. JP charged with minimal amount of serosanguinous drainage. Foley with clear, yellow urine. Incisions intact with dermabond without erythema or drainage.   Disposition: Discharge disposition: 01-Home or Self Care       Discharge Instructions     Call MD for:  difficulty breathing, headache or visual disturbances   Complete by: As directed    Call MD for:  extreme fatigue   Complete by: As directed    Call MD for:  hives   Complete by: As directed    Call MD for:  persistant dizziness or light-headedness   Complete by: As directed    Call MD for:  persistant nausea and vomiting   Complete by: As directed    Call MD for:  redness, tenderness, or signs of infection (pain, swelling, redness, odor or green/yellow discharge around incision site)   Complete by: As directed    Call MD for:  severe uncontrolled pain   Complete by: As directed    Call MD for:  temperature >100.4   Complete by: As directed    Diet - low sodium heart healthy   Complete by: As directed    Discharge wound care:   Complete by: As directed  You will have a white honeycomb dressing over your larger incision. This dressing can be removed 5 days after surgery and you do not need to reapply a new dressing. Once you remove the dressing, you will notice that you have the surgical glue (dermabond) on the incision and this will peel off on its own. You can get this dressing wet in the shower the days after surgery prior to removal on the 5th day.   Driving Restrictions   Complete by: As directed     No driving for 1 week(s).  Do not take narcotics and drive. You need to make sure your reaction time has returned.   Increase activity slowly   Complete by: As directed    Lifting restrictions   Complete by: As directed    No lifting greater than 10 lbs, pushing, pulling, straining for 6 weeks.   Sexual Activity Restrictions   Complete by: As directed    No sexual activity, nothing in the vagina, for 4-6 weeks.      Allergies as of 07/19/2023   No Known Allergies      Medication List     STOP taking these medications    metroNIDAZOLE 500 MG tablet Commonly known as: FLAGYL   nitrofurantoin (macrocrystal-monohydrate) 100 MG capsule Commonly known as: Macrobid   polyethylene glycol powder 17 GM/SCOOP powder Commonly known as: MiraLax       TAKE these medications    albuterol 108 (90 Base) MCG/ACT inhaler Commonly known as: VENTOLIN HFA Inhale 2 puffs into the lungs every 6 (six) hours as needed for wheezing or shortness of breath.   apixaban 2.5 MG Tabs tablet Commonly known as: Eliquis Take 1 tablet (2.5 mg total) by mouth 2 (two) times daily. Start taking on: July 20, 2023   cholecalciferol 25 MCG (1000 UNIT) tablet Commonly known as: VITAMIN D3 Take 1,000 Units by mouth daily.   CoQ10 100 MG Caps Take 100 mg by mouth daily.   losartan 50 MG tablet Commonly known as: COZAAR Take 1 tablet (50 mg total) by mouth daily.   rosuvastatin 10 MG tablet Commonly known as: CRESTOR Take 1 tablet (10 mg total) by mouth daily.   senna-docusate 8.6-50 MG tablet Commonly known as: Senokot-S Take 2 tablets by mouth at bedtime. For AFTER surgery, do not take if having diarrhea- surgery 07/18/23   Stiolto Respimat 2.5-2.5 MCG/ACT Aers Generic drug: Tiotropium Bromide-Olodaterol Inhale 2 puffs into the lungs daily. What changed: when to take this   traMADol 50 MG tablet Commonly known as: ULTRAM Take 1 tablet (50 mg total) by mouth every 6 (six) hours as needed for  severe pain. For AFTER surgery only, do not take and drive               Discharge Care Instructions  (From admission, onward)           Start     Ordered   07/19/23 0000  Discharge wound care:       Comments: You will have a white honeycomb dressing over your larger incision. This dressing can be removed 5 days after surgery and you do not need to reapply a new dressing. Once you remove the dressing, you will notice that you have the surgical glue (dermabond) on the incision and this will peel off on its own. You can get this dressing wet in the shower the days after surgery prior to removal on the 5th day.   07/19/23 1652  Follow-up Information     Carver Fila, MD Follow up on 07/25/2023.   Specialty: Gynecologic Oncology Why: around 4:20 pm will be a PHONE visit with Dr. Pricilla Holm to check in and discuss pathology. IN PERSON visit will be on 08/22/23 at 3:30 pm at the [facility] information: [address] [place] [address] [phone]         Warner Mccreedy D, NP Follow up on 07/23/2023.   Specialty: Gynecologic Oncology [facility]: [facility] at 1pm to check the JP and possibly remove based on results of JP fluid testing. Contact information: [address] [place] [address] [phone]         [facility] Gynecological Oncology Follow up on 08/02/2023.   Specialty: Gynecologic Oncology Why: at 1pm with the nurse at the [facility] for voiding trial and catheter removal Contact information: [address] [place] [address] [phone]                Greater than thirty minutes were spend for face to face discharge instructions and discharge orders/summary in EPIC.   Signed: Doylene Bode 07/19/2023, 8:10 PM

## 2023-07-19 NOTE — Progress Notes (Signed)
GYN Oncology Progress note  Patient reports overall doing well. She reports feeling tired due to lack of rest given frequent check ins from staff. Feels breathing is at baseline. No nausea or emesis. Minimal pain reported. Discussed lab results (H&H-stable) and recent chest xray. Discussed with the patient the importance of IS use and staying hydrated at home. She will keep a check on her oxygen level at home and wear oxygen during the day if needed. Preop, she was wearing it at night. She uses a cane typically for support and states she has a walker at home she can use as well. Asking does she really need the foley. Discussed reasoning for JP drain and foley. Patient feels she could be discharged later today. Advised patient to ambulate with the staff again and make sure she feels steady, no dizziness. Will ask RN for an update after patient has ambulated again.

## 2023-07-19 NOTE — Progress Notes (Signed)
1 Day Post-Op Procedure(s) (LRB): ROBOTIC ASSISTED PELVIC MASS EXCISION WITH RADICAL DISSECTION, MINI LAPAROTOMY, CYSTOSCOPY (N/A)  Subjective: Patient reports "feeling beat up." She was given a chicken sandwich last night but states she could not eat it. She has been tolerating sipping on liquids. No nausea or emesis reported. Has not been out of bed. She experienced moderate right eye burning last pm. States this has improved this am. She is able to see at baseline (has hx of glaucoma) and denies pain in this eye. Reporting abdominal soreness, not significant at this time. Using ice. Discussed foley in place with plans for this to stay in for [redacted] weeks along with the JP drain to stay in until at least this upcoming Tues. Denies chest pain, dyspnea. Had mild shortness of breath when getting cleaned up this am that resolved after. Wore oxygen overnight (home regimen) with O2 currently at 95%. No flatus reported.   Objective: Vital signs in last 24 hours: Temp:  [97.7 F (36.5 C)-98.4 F (36.9 C)] 98.1 F (36.7 C) (08/23 0542) Pulse Rate:  [54-79] 62 (08/23 0542) Resp:  [15-18] 18 (08/23 0542) BP: (133-163)/(57-104) 148/71 (08/23 0542) SpO2:  [93 %-100 %] 97 % (08/23 0754) Weight:  [144 lb (65.3 kg)] 144 lb (65.3 kg) (08/22 1035) Last BM Date : 07/18/23  Intake/Output from previous day: 08/22 0701 - 08/23 0700 In: 2390.9 [P.O.:630; I.V.:1660.9; IV Piggyback:100] Out: 2130 [Urine:1950; Drains:80; Blood:100]  Physical Examination: General: alert, cooperative, and no distress Resp: clear to auscultation bilaterally Cardio: regular rate and rhythm, S1, S2 normal, no murmur, click, rub or gallop and mildly brady at 55 GI: incision: lap site incisions to the abdomen with dermabond intact with no active drainage, mini laparotomy incision with op site dressing in place and intact-no drainage noted underneath and abdomen soft, active bowel sounds, non-distended, appropriately tender. JP drain to the  right abdomen with serosanguinous drainage. Drain emptied and fluid sent for JP creatinine level. Dressing intact around drain and drain stripped.  Extremities: extremities normal, atraumatic, no cyanosis or edema Foley in place with clear, yellow urine. SCDs on.  Right eye without erythema, surrounding tissue slightly more edematous compared to the left eye.   Labs: WBC/Hgb/Hct/Plts:  8.0/12.6/39.6/166 (08/23 5784) BUN/Cr/glu/ALT/AST/amyl/lip:  13/0.73/--/--/--/--/-- (08/23 0415)  Assessment: 77 y.o. s/p Procedure(s): ROBOTIC ASSISTED PELVIC MASS EXCISION WITH RADICAL DISSECTION, MINI LAPAROTOMY, CYSTOSCOPY: stable Pain:  Pain is well-controlled on PRN medications.  Heme: Hgb 12.6 and Hct 39.6 this am. Appropriate given preop values and surgical losses.  ID: WBC 8.0. Given decadron and IV abx intra-op. No evidence of infection.  CV: BP and HR stable. Continue to monitor with ordered vital signs.  GI:  Tolerating po: yes. Antiemetics ordered if needed.  GU: Foley in place. Patient to be discharged home with foley for 2 weeks. Creatinine 0.73. Plan to check JP creatinine level this am.     FEN: No critical values on am labs.  Prophylaxis: SCDs and lovenox ordered.  Plan: JP creatinine level obtained this am Diet as tolerated Encourage increasing mobility If meeting milestones later today, plan for discharge home. Will be discharged with JP and foley in place with follow arranged with our office. JP and foley teaching Continue plan of care per Dr. Pricilla Holm    LOS: 1 day    Doylene Bode 07/19/2023, 7:58 AM

## 2023-07-22 ENCOUNTER — Telehealth: Payer: Self-pay

## 2023-07-22 ENCOUNTER — Other Ambulatory Visit: Payer: Self-pay | Admitting: Gynecologic Oncology

## 2023-07-22 DIAGNOSIS — R1900 Intra-abdominal and pelvic swelling, mass and lump, unspecified site: Secondary | ICD-10-CM

## 2023-07-22 DIAGNOSIS — D3910 Neoplasm of uncertain behavior of unspecified ovary: Secondary | ICD-10-CM

## 2023-07-22 LAB — SURGICAL PATHOLOGY

## 2023-07-22 NOTE — Telephone Encounter (Signed)
Spoke with Tiffany [name] this morning. She states she is eating, drinking.Foley catheter draining well with clear urine. JP drain has been emptied with Sat/Sun at 1tbsp each day.  She has not had a BM yet but is passing gas. She has not taken senokot. But will start Miralax today and encouraged her to drink plenty of water. She denies fever or chills. Incisions are dry and intact. She rates her pain 6/10,mainly when she coughs Her pain is controlled with Tylenol.   Pt aware of appointment with Warner Mccreedy APP tomorrow. Aware a sample will be taken from JP drain and sent to lab. So aware her wait may be longer than normal. She voiced an understanding.     Instructed to call office with any fever, chills, purulent drainage, uncontrolled pain or any other questions or concerns. Patient verbalizes understanding.   Pt aware of post op appointments as well as the office number [phone] and after hours number [phone] to call if she has any questions or concerns

## 2023-07-23 ENCOUNTER — Inpatient Hospital Stay (HOSPITAL_BASED_OUTPATIENT_CLINIC_OR_DEPARTMENT_OTHER): Payer: Medicare Other | Admitting: Gynecologic Oncology

## 2023-07-23 ENCOUNTER — Other Ambulatory Visit: Payer: Self-pay | Admitting: Gynecologic Oncology

## 2023-07-23 ENCOUNTER — Inpatient Hospital Stay: Payer: Medicare Other

## 2023-07-23 VITALS — BP 125/56 | HR 79 | Temp 98.8°F | Resp 18 | Ht 67.0 in | Wt 143.0 lb

## 2023-07-23 DIAGNOSIS — R978 Other abnormal tumor markers: Secondary | ICD-10-CM | POA: Diagnosis not present

## 2023-07-23 DIAGNOSIS — Z8719 Personal history of other diseases of the digestive system: Secondary | ICD-10-CM | POA: Diagnosis not present

## 2023-07-23 DIAGNOSIS — D3910 Neoplasm of uncertain behavior of unspecified ovary: Secondary | ICD-10-CM

## 2023-07-23 DIAGNOSIS — E785 Hyperlipidemia, unspecified: Secondary | ICD-10-CM | POA: Diagnosis not present

## 2023-07-23 DIAGNOSIS — M8580 Other specified disorders of bone density and structure, unspecified site: Secondary | ICD-10-CM | POA: Diagnosis not present

## 2023-07-23 DIAGNOSIS — I10 Essential (primary) hypertension: Secondary | ICD-10-CM | POA: Diagnosis not present

## 2023-07-23 DIAGNOSIS — R300 Dysuria: Secondary | ICD-10-CM | POA: Diagnosis not present

## 2023-07-23 DIAGNOSIS — D472 Monoclonal gammopathy: Secondary | ICD-10-CM | POA: Diagnosis not present

## 2023-07-23 DIAGNOSIS — J439 Emphysema, unspecified: Secondary | ICD-10-CM | POA: Diagnosis not present

## 2023-07-23 DIAGNOSIS — Z90710 Acquired absence of both cervix and uterus: Secondary | ICD-10-CM | POA: Diagnosis not present

## 2023-07-23 DIAGNOSIS — R1900 Intra-abdominal and pelvic swelling, mass and lump, unspecified site: Secondary | ICD-10-CM

## 2023-07-23 DIAGNOSIS — Z87891 Personal history of nicotine dependence: Secondary | ICD-10-CM | POA: Diagnosis not present

## 2023-07-23 DIAGNOSIS — Z85828 Personal history of other malignant neoplasm of skin: Secondary | ICD-10-CM | POA: Diagnosis not present

## 2023-07-23 DIAGNOSIS — R918 Other nonspecific abnormal finding of lung field: Secondary | ICD-10-CM | POA: Diagnosis not present

## 2023-07-23 DIAGNOSIS — R634 Abnormal weight loss: Secondary | ICD-10-CM | POA: Diagnosis not present

## 2023-07-23 DIAGNOSIS — Z90722 Acquired absence of ovaries, bilateral: Secondary | ICD-10-CM | POA: Diagnosis not present

## 2023-07-23 DIAGNOSIS — Z86018 Personal history of other benign neoplasm: Secondary | ICD-10-CM | POA: Diagnosis not present

## 2023-07-23 LAB — BASIC METABOLIC PANEL
Anion gap: 8 (ref 5–15)
BUN: 13 mg/dL (ref 8–23)
CO2: 28 mmol/L (ref 22–32)
Calcium: 9.4 mg/dL (ref 8.9–10.3)
Chloride: 100 mmol/L (ref 98–111)
Creatinine, Ser: 0.56 mg/dL (ref 0.44–1.00)
GFR, Estimated: >60 mL/min (ref >60)
Glucose, Bld: 122 mg/dL — ABNORMAL HIGH (ref 70–99)
Potassium: 4.1 mmol/L (ref 3.5–5.1)
Sodium: 136 mmol/L (ref 135–145)

## 2023-07-23 LAB — CREATININE, FLUID (PLEURAL, PERITONEAL, JP DRAINAGE): Creat, Fluid: 0.5 mg/dL

## 2023-07-23 LAB — CBC (CANCER CENTER ONLY)
HCT: 40.3 % (ref 36.0–46.0)
Hemoglobin: 13.4 g/dL (ref 12.0–15.0)
MCH: 30.2 pg (ref 26.0–34.0)
MCHC: 33.3 g/dL (ref 30.0–36.0)
MCV: 90.8 fL (ref 80.0–100.0)
Platelet Count: 245 10*3/uL (ref 150–400)
RBC: 4.44 MIL/uL (ref 3.87–5.11)
RDW: 13.1 % (ref 11.5–15.5)
WBC Count: 7 10*3/uL (ref 4.0–10.5)
nRBC: 0 % (ref 0.0–0.2)

## 2023-07-23 NOTE — Patient Instructions (Signed)
Today we removed the JP drain from your abdomen. Plan on keeping a clean, dry dressing over this until it scabs over.  We will plan on removing the foley catheter from your bladder on this Friday in the office. We will discuss signs and symptoms to report on Friday.   Please call for any new symptoms such as pain, fever.

## 2023-07-23 NOTE — Progress Notes (Signed)
GYN Oncology Post-Operative Visit  HPI: Tiffany [name] is a 77 year old female with recurrent granulosa cell tumor s/p robotic-assisted radical dissection for pelvic mass, lysis of adhesions for approximately 10 minutes, cystoscopy, mini-lap for specimen delivery on 07/18/2023 with Dr. Eugene Garnet. She presents today to the office for post-op check and JP drain removal from the abdomen pending JP creatinine level. In the hospital, prior to discharge, the JP creatinine level was at 0.6. Today, in the office, it returned as 0.5. CBC and Bmet obtained as well.   Interval: She states she has been doing well at home but feels like she cannot do anything with the foley catheter in place.  She is having challenging times with mobility related to having the catheter in.  The JP drain has had minimal output, one tablespoon a day and has been serosanguinous.  States she is tolerating her diet with no nausea or emesis.  She is passing flatus.  She has not had a bowel movement since surgery and has taken MiraLAX.  Abdominal pain reported as minimum.  No issues voiced with her incisions.  No lower extremity edema or pain.  She states she has been checking her oxygen level at home during the day and it has been normal for her and she continues to do her home regimen of oxygen at nighttime.  O2 sat on room air currently 95 to 96%.  She reports intermittent non-productive cough with no fever or chills.  She has nausea when she is trying to get the phlegm up and then after that it resolves. She does not feel she can wait the full 2 weeks recommended for the foley. She has been taking the Eliquis as prescribed and denies abnormal bleeding.  Exam: Alert, oriented, in no acute distress, ambulating with cane. Lungs clear. Heart regular in rate and rhythm. Abdomen soft, active bowel sounds, non-tender. Incisions healing well without erythema or drainage.  No lower extrem edema noted.  JP to the abdomen with minimal output  present. After patient consent, the JP drain was removed from the right abdomen without difficulty. Patient tolerated this well. Dry dressing applied.   CBC    Component Value Date/Time   WBC 7.0 07/23/2023 1242   WBC 8.0 07/19/2023 0415   RBC 4.44 07/23/2023 1242   HGB 13.4 07/23/2023 1242   HGB 12.4 05/08/2023 0953   HCT 40.3 07/23/2023 1242   HCT 38.0 05/08/2023 0953   PLT 245 07/23/2023 1242   PLT 207 05/08/2023 0953   MCV 90.8 07/23/2023 1242   MCV 93 05/08/2023 0953   MCH 30.2 07/23/2023 1242   MCHC 33.3 07/23/2023 1242   RDW 13.1 07/23/2023 1242   RDW 12.2 05/08/2023 0953   LYMPHSABS 1.0 05/08/2023 0953   MONOABS 0.8 09/16/2020 0054   EOSABS 0.1 05/08/2023 0953   BASOSABS 0.0 05/08/2023 0953   BMET    Component Value Date/Time   NA 136 07/23/2023 1242   NA 137 05/08/2023 0953   K 4.1 07/23/2023 1242   CL 100 07/23/2023 1242   CO2 28 07/23/2023 1242   GLUCOSE 122 (H) 07/23/2023 1242   BUN 13 07/23/2023 1242   BUN 17 05/08/2023 0953   CREATININE 0.56 07/23/2023 1242   CREATININE 0.72 12/02/2017 0945   CALCIUM 9.4 07/23/2023 1242   EGFR 90 05/08/2023 0953   GFRNONAA >60 07/23/2023 1242    Assessment/Plan: Discussed with Dr. Pricilla Holm patient's strong desire to have the catheter removed. Will plan for retrograde cystogram this  Friday and if normal, plan for foley removal in the office. Patient is agreeable with this plan. I discussed the risks of removing the catheter too soon and pt verbalizes understanding but still would like to proceed with removal of the foley one week sooner than recommended. Incision and JP site care discussed. Reportable signs and symptoms reviewed. She is advised to follow up as planned this Friday or sooner if needed. She is advised to call for any needs, questions, or concerns.

## 2023-07-24 ENCOUNTER — Ambulatory Visit: Payer: Medicare Other | Admitting: Emergency Medicine

## 2023-07-24 ENCOUNTER — Ambulatory Visit (HOSPITAL_BASED_OUTPATIENT_CLINIC_OR_DEPARTMENT_OTHER): Payer: Medicare Other | Admitting: Pulmonary Disease

## 2023-07-24 ENCOUNTER — Other Ambulatory Visit: Payer: Self-pay | Admitting: Gynecologic Oncology

## 2023-07-24 ENCOUNTER — Telehealth: Payer: Self-pay | Admitting: *Deleted

## 2023-07-24 DIAGNOSIS — Z978 Presence of other specified devices: Secondary | ICD-10-CM

## 2023-07-24 DIAGNOSIS — R1900 Intra-abdominal and pelvic swelling, mass and lump, unspecified site: Secondary | ICD-10-CM

## 2023-07-24 DIAGNOSIS — D3910 Neoplasm of uncertain behavior of unspecified ovary: Secondary | ICD-10-CM

## 2023-07-24 NOTE — Telephone Encounter (Signed)
Spoke with Tiffany [name] and instructed patient that an appointment for a retrograde cystogram ( to see how her bladder is healing) was placed for Friday morning at [facility] and she is to arrive by 0845 and then after her cystogram she will be seen in our office at 1000. Pt agreed to appt. And  verbalized understanding and had no questions at this time.

## 2023-07-25 ENCOUNTER — Inpatient Hospital Stay (HOSPITAL_BASED_OUTPATIENT_CLINIC_OR_DEPARTMENT_OTHER): Payer: Medicare Other | Admitting: Gynecologic Oncology

## 2023-07-25 ENCOUNTER — Encounter: Payer: Self-pay | Admitting: Gynecologic Oncology

## 2023-07-25 DIAGNOSIS — C561 Malignant neoplasm of right ovary: Secondary | ICD-10-CM

## 2023-07-25 DIAGNOSIS — Z90710 Acquired absence of both cervix and uterus: Secondary | ICD-10-CM

## 2023-07-25 DIAGNOSIS — Z7189 Other specified counseling: Secondary | ICD-10-CM

## 2023-07-25 DIAGNOSIS — Z90722 Acquired absence of ovaries, bilateral: Secondary | ICD-10-CM

## 2023-07-25 DIAGNOSIS — D3910 Neoplasm of uncertain behavior of unspecified ovary: Secondary | ICD-10-CM

## 2023-07-25 DIAGNOSIS — Z978 Presence of other specified devices: Secondary | ICD-10-CM

## 2023-07-25 DIAGNOSIS — K8689 Other specified diseases of pancreas: Secondary | ICD-10-CM

## 2023-07-25 NOTE — Progress Notes (Signed)
Gynecologic Oncology Telehealth Note: Gyn-Onc  I connected with Tiffany [name] on 07/25/23 at  4:20 PM EDT by telephone and verified that I am speaking with the correct person using two identifiers.  I discussed the limitations, risks, security and privacy concerns of performing an evaluation and management service by telemedicine and the availability of in-person appointments. I also discussed with the patient that there may be a patient responsible charge related to this service. The patient expressed understanding and agreed to proceed.  Other persons participating in the visit and their role in the encounter: none.  Patient's location: home Provider's location: [facility]  Reason for Visit: follow-up  Treatment History: The patient had a recent CT scan in the setting of her COPD and lung cancer screening.  CT of the chest in early June showed a new part solid masslike lesion in the posterior segment right upper lobe concerning for possible malignancy.  PET scan was performed on 7/15 and shows a mixed attenuation right upper lobe lung mass that is hypermetabolic, most consistent with primary bronchogenic carcinoma.  Incidental finding of a 12.4 cm partially solid, partially cystic mass in the anterior pelvis suspected to be arising from the ovary.  There is hypermetabolism cups corresponding to soft tissue fullness about the pancreatic head and descending duodenum, with differential diagnosis including pancreatic head carcinoma or focal pancreatitis.   The patient saw Dr. Delton Coombes on 7/31.  She is going to be scheduled for navigational bronchoscopy (scheduled on 8/13) to evaluate her right upper lobe mass.  Referral was sent to GI in the setting of her pancreatic hypermetabolism.   Her history is notable for stage IA granulosa cell tumor diagnosed in 2007.  At that time, she underwent ex lap, bilateral salpingo-oophorectomy, pelvic lymphadenectomy, and omentectomy with Dr. Stanford Breed.  Pathology  revealed a 23 cm adult granulosa cell tumor of the left ovary with a benign fallopian tube.  Right tube and ovary were benign.  No involved pelvic lymph nodes.  Small bowel adhesions were submitted and negative for evidence of malignancy.  Inhibin levels at the time of her diagnosis were elevated to 77.  She was last seen in 2012 in our clinic and remained NED.  Inhibin B on 8/9: 1,006.3  07/18/23: Robotic-assisted radical dissection for pelvic mass, lysis of adhesions for approximately 10 minutes, cystoscopy, mini-lap for specimen delivery   Interval History: Doing well.  Very much looking forward to getting the catheter out.  Reports bowels are now moving well.  Denies any significant abdominal pain.  Past Medical/Surgical History: Past Medical History:  Diagnosis Date   Abnormal PFT 09/02/2014   moderate restrictive ventilatory defect.   Arthritis    hip   BCC (basal cell carcinoma), face 2010   Dr. Terri Piedra   Colon polyp 12/2003   Diverticulosis 11/2010   seen on colonoscopy and air contrast BE   Emphysema of lung (HCC)    Hyperlipidemia    Hypertension    previously treated with meds, resolved   MGUS (monoclonal gammopathy of unknown significance) 1999   prevously under care of Dr. 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   Tobacco abuse    quit in 2019    Past Surgical History:  Procedure Laterality Date   BASAL CELL CARCINOMA EXCISION  11/26/2008   L face, R temple   BILATERAL SALPINGOOPHORECTOMY  11/26/2005   cancerous tumor R ovary   BRONCHIAL BIOPSY  07/09/2023   Procedure: BRONCHIAL BIOPSIES;  Surgeon: Leslye Peer, MD;  Location: [facility] ENDOSCOPY;  Service: Pulmonary;;   BRONCHIAL BRUSHINGS  07/09/2023   Procedure: BRONCHIAL BRUSHINGS;  Surgeon: Leslye Peer, MD;  Location:  [facility] ENDOSCOPY;  Service: Pulmonary;;   BRONCHIAL NEEDLE ASPIRATION BIOPSY  07/09/2023   Procedure: BRONCHIAL NEEDLE ASPIRATION BIOPSIES;  Surgeon: Leslye Peer, MD;  Location: [facility] ENDOSCOPY;  Service: Pulmonary;;   CATARACT EXTRACTION  11/27/2007   bilateral   COLONOSCOPY  11/26/2010   Dr. Laural Benes. Diverticulosis. BE also done--normal   FIDUCIAL MARKER PLACEMENT  07/09/2023   Procedure: FIDUCIAL MARKER PLACEMENT;  Surgeon: Leslye Peer, MD;  Location[facility] ENDOSCOPY;  Service: Pulmonary;;   OMENTECTOMY  11/26/2005   and pelvic lymphadenectomy (with BSO)   ROBOTIC ASSISTED LAPAROSCOPIC LYSIS OF ADHESION N/A 07/18/2023   Procedure: ROBOTIC ASSISTED PELVIC MASS EXCISION WITH RADICAL DISSECTION, MINI LAPAROTOMY, CYSTOSCOPY;  Surgeon: Carver Fila, MD;  Location: [facility] ORS;  Service: Gynecology;  Laterality: N/A;   SKIN CANCER EXCISION Left 07/2021   neck; pt can't recall type of cancer   TONSILLECTOMY  age 33   VAGINAL HYSTERECTOMY  late 20's   "didn't want any more children"    Family History  Problem Relation Age of Onset   Stroke Mother    Cerebral aneurysm Mother    Hypertension Father    Suicidality Father    Diabetes Maternal Aunt    Diabetes Maternal Aunt    Breast cancer Maternal Aunt    Diabetes Maternal Aunt    Breast cancer Maternal Aunt        2 aunts   Throat cancer Maternal Uncle    Lung cancer Maternal Uncle    Prostate cancer Paternal Uncle    Colon cancer Neg Hx    Ovarian cancer Neg Hx    Endometrial cancer Neg Hx    Pancreatic cancer Neg Hx     Social History   Socioeconomic History   Marital status: Married    Spouse name: Not on file   Number of children: 1   Years of education: Not on file   Highest education level: Not on file  Occupational History   Occupation: retired Warehouse manager company)    Employer: RETIRED  Tobacco Use   Smoking status: Former    Current packs/day: 0.00    Average packs/day: 2.0 packs/day for 59.8 years (119.6 ttl  pk-yrs)    Types: Cigarettes    Start date: 70    Quit date: 09/10/2018    Years since quitting: 4.8    Passive exposure: Past (quit in 2019)   Smokeless tobacco: Never  Vaping Use   Vaping status: Some Days  Substance and Sexual Activity   Alcohol use: Yes    Comment: rarely   Drug use: No   Sexual activity: Not Currently    Partners: Male  Other Topics Concern   Not on file  Social History Narrative   Lives with husband, 1 cat.  Son lives with him during the week, stays with his girlfriend on the weekends (divorced).   1 grandson.  Expecting a great-grandbaby      Updated 04/2023   Social Determinants of Health   Financial Resource Strain: Not on file  Food Insecurity: No Food Insecurity (07/18/2023)  Hunger Vital Sign    Worried About Running Out of Food in the Last Year: Never true    Ran Out of Food in the Last Year: Never true  Transportation Needs: No Transportation Needs (07/18/2023)   PRAPARE - Administrator, Civil Service (Medical): No    Lack of Transportation (Non-Medical): No  Physical Activity: Not on file  Stress: Not on file  Social Connections: Not on file    Current Medications:  Current Outpatient Medications:    albuterol (VENTOLIN HFA) 108 (90 Base) MCG/ACT inhaler, Inhale 2 puffs into the lungs every 6 (six) hours as needed for wheezing or shortness of breath., Disp: 8 g, Rfl: 1   apixaban (ELIQUIS) 2.5 MG TABS tablet, Take 1 tablet (2.5 mg total) by mouth 2 (two) times daily., Disp: 26 tablet, Rfl: 0   cholecalciferol (VITAMIN D3) 25 MCG (1000 UNIT) tablet, Take 1,000 Units by mouth daily., Disp: , Rfl:    Coenzyme Q10 (COQ10) 100 MG CAPS, Take 100 mg by mouth daily., Disp: , Rfl:    losartan (COZAAR) 50 MG tablet, Take 1 tablet (50 mg total) by mouth daily., Disp: 90 tablet, Rfl: 3   rosuvastatin (CRESTOR) 10 MG tablet, Take 1 tablet (10 mg total) by mouth daily., Disp: 90 tablet, Rfl: 3   senna-docusate (SENOKOT-S) 8.6-50 MG tablet,  Take 2 tablets by mouth at bedtime. For AFTER surgery, do not take if having diarrhea- surgery 07/18/23, Disp: , Rfl:    Tiotropium Bromide-Olodaterol (STIOLTO RESPIMAT) 2.5-2.5 MCG/ACT AERS, Inhale 2 puffs into the lungs daily. (Patient taking differently: Inhale 2 puffs into the lungs every evening.), Disp: 4 g, Rfl: 11   traMADol (ULTRAM) 50 MG tablet, Take 1 tablet (50 mg total) by mouth every 6 (six) hours as needed for severe pain. For AFTER surgery only, do not take and drive, Disp: 10 tablet, Rfl: 0  Review of Symptoms: Pertinent positives as per HPI.  Physical Exam: Deferred given limitations of phone visit.  Laboratory & Radiologic Studies: A. PELVIC MASS, EXCISION: Granulosa cell tumor, adult type with tumor necrosis See oncology table and comment  ONCOLOGY TABLE: OVARY or FALLOPIAN TUBE or PRIMARY PERITONEUM: Resection Procedure: Pelvic mass excision Specimen Integrity: Received in fragments Tumor Site: 245 g, see comment Tumor Size: See comment Histologic Type: Adult type granulosa cell tumor Histologic Grade: Well-differentiated Ovarian Surface Involvement: See comment Fallopian Tube Surface Involvement: Not applicable Implants: Not applicable Lymphatic and/or Vascular Invasion: Not identified Other Tissue/ Organ Involvement: Not applicable Largest Extrapelvic Peritoneal Focus: Not applicable Peritoneal/Ascitic Fluid Involvement: No malignant cells identified ([id number]) Chemotherapy Response Score (CRS): Not applicable Regional Lymph Nodes: No lymph nodes submitted Distant Metastasis:      Distant Site(s) Involved: Not applicable Pathologic Stage Classification (pTNM, AJCC 8th Edition): pT see comment, pN not assigned Ancillary Studies: Can be performed if requested Representative Tumor Block: A1-A9 Comment(s): The tumor has histologic features consistent with adult type granulosa cell tumor with large areas of tumor necrosis.  The tumor is received in fragments  which somewhat hampers evaluation for ovarian surface involvement; however, there is skeletal muscle and adipose tissue attached to the tumor and focally tumor is adjacent to skeletal muscle and adipose tissue with minimal intervening connective tissue. This finding suggests that the staging is most likely pT1c2.  Clinical and intraoperative surgical findings are necessary for accurate staging. Accurate determination of the greatest tumor dimension is also hampered by the fragmented nature of the specimen.   Cytology FINAL MICROSCOPIC DIAGNOSIS:  -  No malignant cells identified   Assessment & Plan: Tiffany [name] is a 77 y.o. woman with recurrent granulosa cell tumor, recently diagnosed lung cancer, and pancreatic lesion.  The patient is doing well after surgery.  She is coming in tomorrow for a cystogram.  If bladder is intact, plan will be to discontinue the Foley tomorrow.  Discussed with her importance of timed voids so that her bladder does not get too distended.  Reviewed with her final pathology which showed recurrent granulosa cell tumor.  This had invaded into the anterior abdominal wall including the piriformis muscle and the serosa of the bladder.  The mass was removed intact with visually negative margins.  Given local invasion into surrounding structures, I would favor treatment.  We discussed chemotherapy versus hormonal therapy.  I think some of this decision will depend on whether she is receiving systemic treatment for another cancer.  She still needs workup of her pancreatic lesion.  I will reach out to Dr. Bertis Ruddy for her thoughts regarding the lung cancer.  I discussed the assessment and treatment plan with the patient. The patient was provided with an opportunity to ask questions and all were answered. The patient agreed with the plan and demonstrated an understanding of the instructions.   The patient was advised to call back or see an in-person evaluation if the  symptoms worsen or if the condition fails to improve as anticipated.   12 minutes of total time was spent for this patient encounter, including preparation, phone counseling with the patient and coordination of care, and documentation of the encounter.   Eugene Garnet, MD  Division of Gynecologic Oncology  Department of Obstetrics and Gynecology  [facility]

## 2023-07-26 ENCOUNTER — Inpatient Hospital Stay: Payer: Medicare Other

## 2023-07-26 ENCOUNTER — Telehealth: Payer: Self-pay | Admitting: Oncology

## 2023-07-26 ENCOUNTER — Ambulatory Visit (HOSPITAL_COMMUNITY)
Admission: RE | Admit: 2023-07-26 | Discharge: 2023-07-26 | Disposition: A | Discharge status: Home / Self Care | Payer: Medicare Other | Source: Ambulatory Visit | Attending: Gynecologic Oncology | Admitting: Gynecologic Oncology

## 2023-07-26 ENCOUNTER — Inpatient Hospital Stay (HOSPITAL_BASED_OUTPATIENT_CLINIC_OR_DEPARTMENT_OTHER): Payer: Medicare Other | Admitting: Gynecologic Oncology

## 2023-07-26 VITALS — BP 128/62 | HR 83 | Temp 98.2°F | Resp 18 | Ht 67.0 in | Wt 143.0 lb

## 2023-07-26 DIAGNOSIS — Z978 Presence of other specified devices: Secondary | ICD-10-CM

## 2023-07-26 DIAGNOSIS — R1900 Intra-abdominal and pelvic swelling, mass and lump, unspecified site: Secondary | ICD-10-CM | POA: Diagnosis not present

## 2023-07-26 DIAGNOSIS — D3910 Neoplasm of uncertain behavior of unspecified ovary: Secondary | ICD-10-CM

## 2023-07-26 DIAGNOSIS — Z906 Acquired absence of other parts of urinary tract: Secondary | ICD-10-CM | POA: Diagnosis not present

## 2023-07-26 MED ORDER — IOTHALAMATE MEGLUMINE 17.2 % UR SOLN
250.0000 mL | Freq: Once | URETHRAL | Status: AC | PRN
  Administered 2023-07-26: 200 mL via INTRAVESICAL

## 2023-07-26 MED ORDER — IOTHALAMATE MEGLUMINE 17.2 % UR SOLN
URETHRAL | Status: AC
  Filled 2023-07-26: qty 250

## 2023-07-26 NOTE — Telephone Encounter (Signed)
Called [facility] and asked to move up patient's appointment on 09/20/2023.  They said Carney Bern is on the cancellation list and right now,  their next available apt is in November.  Asked to send a message to Willette Cluster, NP to see if patient's appointment can be moved up as well.  They will send Gunnar Fusi a message.

## 2023-07-26 NOTE — Patient Instructions (Signed)
Today we removed the foley catheter from your bladder. You will need to try to urinate at home every 3-4 hours during the day even if you do not have the sensation.   Please call the after hours # at [phone] if you develop issues emptying your bladder (cannot urinate, not a large amount, weak stream), increased pelvic pressure/pain.

## 2023-07-29 NOTE — Telephone Encounter (Signed)
Addressed in subsequent phone note.

## 2023-07-30 NOTE — Progress Notes (Unsigned)
error 

## 2023-07-30 NOTE — Progress Notes (Signed)
Patient came in today for foley catheter removal. Instilled of saline into bladder prior to removing catheter. Balloon deflated and catheter removed. Immediately upon removal, patient voided, unable to control her bladder. Patient escorted to bathroom to attempt to void, and was only able to void about 5ml. Patient advised to sit in waiting area and was given a large cup of water. Advised patient to let us know when she can attempt to void again. Second attempt, patient only about to void about 10ml. Patient given 2 additional cups of water and asked to waiting in waiting area until she felt she could void. On 3rd attempt patient voided about 15ml. Patient declined to have catheter put back in or to wait any longer, stating she wanted to go home. Extensive education provided regarding voiding schedule (see discharge instructions) and to go to the ER if she is unable to void for 8 hours. Patient verbalized understanding and was discharged with a hat to measure urine output at home. Advised to call our office at [phone] for any questions or concerns.

## 2023-07-31 ENCOUNTER — Ambulatory Visit: Payer: Medicare Other | Admitting: Physician Assistant

## 2023-08-01 ENCOUNTER — Other Ambulatory Visit: Payer: Self-pay | Admitting: Gynecologic Oncology

## 2023-08-05 ENCOUNTER — Other Ambulatory Visit: Payer: Self-pay | Admitting: Oncology

## 2023-08-05 ENCOUNTER — Other Ambulatory Visit: Payer: Self-pay | Admitting: Medical Oncology

## 2023-08-05 ENCOUNTER — Encounter: Payer: Self-pay | Admitting: Oncology

## 2023-08-05 ENCOUNTER — Encounter: Payer: Self-pay | Admitting: Genetic Counselor

## 2023-08-05 DIAGNOSIS — R918 Other nonspecific abnormal finding of lung field: Secondary | ICD-10-CM

## 2023-08-05 DIAGNOSIS — D472 Monoclonal gammopathy: Secondary | ICD-10-CM

## 2023-08-05 NOTE — Progress Notes (Signed)
Gynecologic Oncology Multi-Disciplinary Disposition Conference Note  Date of the Conference: 08/05/2023  Patient Name: Tiffany [name]  Referring Provider: Dr. Everardo All Primary GYN Oncologist: Dr. Pricilla Holm   Stage/Disposition:  Recurrent granulosa cell tumor with recently diagnosed lung cancer and pancreatic lesion. Disposition is to systemic chemotherapy and hormonal therapy.   This Multidisciplinary conference took place involving physicians from Gynecologic Oncology, Medical Oncology, Radiation Oncology, Pathology, Radiology along with the Gynecologic Oncology Nurse Practitioner and Gynecologic Oncology Nurse Navigator.  Comprehensive assessment of the patient's malignancy, staging, need for surgery, chemotherapy, radiation therapy, and need for further testing were reviewed. Supportive measures, both inpatient and following discharge were also discussed. The recommended plan of care is documented. Greater than 35 minutes were spent correlating and coordinating this patient's care.

## 2023-08-06 ENCOUNTER — Telehealth: Payer: Self-pay | Admitting: *Deleted

## 2023-08-06 ENCOUNTER — Other Ambulatory Visit: Payer: Self-pay | Admitting: Gynecologic Oncology

## 2023-08-06 ENCOUNTER — Inpatient Hospital Stay (HOSPITAL_BASED_OUTPATIENT_CLINIC_OR_DEPARTMENT_OTHER): Payer: Medicare Other

## 2023-08-06 ENCOUNTER — Inpatient Hospital Stay: Payer: Medicare Other | Attending: Gynecologic Oncology | Admitting: Internal Medicine

## 2023-08-06 VITALS — BP 130/72 | HR 69 | Temp 98.2°F | Resp 16 | Ht 65.5 in | Wt 141.0 lb

## 2023-08-06 DIAGNOSIS — Z801 Family history of malignant neoplasm of trachea, bronchus and lung: Secondary | ICD-10-CM | POA: Diagnosis not present

## 2023-08-06 DIAGNOSIS — Z8719 Personal history of other diseases of the digestive system: Secondary | ICD-10-CM | POA: Insufficient documentation

## 2023-08-06 DIAGNOSIS — J439 Emphysema, unspecified: Secondary | ICD-10-CM | POA: Diagnosis not present

## 2023-08-06 DIAGNOSIS — Z7901 Long term (current) use of anticoagulants: Secondary | ICD-10-CM | POA: Insufficient documentation

## 2023-08-06 DIAGNOSIS — Z8543 Personal history of malignant neoplasm of ovary: Secondary | ICD-10-CM

## 2023-08-06 DIAGNOSIS — Z8249 Family history of ischemic heart disease and other diseases of the circulatory system: Secondary | ICD-10-CM | POA: Insufficient documentation

## 2023-08-06 DIAGNOSIS — Z818 Family history of other mental and behavioral disorders: Secondary | ICD-10-CM

## 2023-08-06 DIAGNOSIS — Z803 Family history of malignant neoplasm of breast: Secondary | ICD-10-CM | POA: Insufficient documentation

## 2023-08-06 DIAGNOSIS — C3490 Malignant neoplasm of unspecified part of unspecified bronchus or lung: Secondary | ICD-10-CM

## 2023-08-06 DIAGNOSIS — Z90722 Acquired absence of ovaries, bilateral: Secondary | ICD-10-CM | POA: Insufficient documentation

## 2023-08-06 DIAGNOSIS — Z85828 Personal history of other malignant neoplasm of skin: Secondary | ICD-10-CM | POA: Insufficient documentation

## 2023-08-06 DIAGNOSIS — E785 Hyperlipidemia, unspecified: Secondary | ICD-10-CM | POA: Diagnosis not present

## 2023-08-06 DIAGNOSIS — Z90710 Acquired absence of both cervix and uterus: Secondary | ICD-10-CM | POA: Diagnosis not present

## 2023-08-06 DIAGNOSIS — R5383 Other fatigue: Secondary | ICD-10-CM

## 2023-08-06 DIAGNOSIS — Z823 Family history of stroke: Secondary | ICD-10-CM | POA: Insufficient documentation

## 2023-08-06 DIAGNOSIS — D472 Monoclonal gammopathy: Secondary | ICD-10-CM | POA: Diagnosis not present

## 2023-08-06 DIAGNOSIS — R918 Other nonspecific abnormal finding of lung field: Secondary | ICD-10-CM

## 2023-08-06 DIAGNOSIS — Z808 Family history of malignant neoplasm of other organs or systems: Secondary | ICD-10-CM | POA: Insufficient documentation

## 2023-08-06 DIAGNOSIS — C3411 Malignant neoplasm of upper lobe, right bronchus or lung: Secondary | ICD-10-CM | POA: Insufficient documentation

## 2023-08-06 DIAGNOSIS — R1900 Intra-abdominal and pelvic swelling, mass and lump, unspecified site: Secondary | ICD-10-CM | POA: Diagnosis not present

## 2023-08-06 DIAGNOSIS — M8580 Other specified disorders of bone density and structure, unspecified site: Secondary | ICD-10-CM | POA: Diagnosis not present

## 2023-08-06 DIAGNOSIS — Q453 Other congenital malformations of pancreas and pancreatic duct: Secondary | ICD-10-CM

## 2023-08-06 DIAGNOSIS — M549 Dorsalgia, unspecified: Secondary | ICD-10-CM | POA: Diagnosis not present

## 2023-08-06 DIAGNOSIS — I10 Essential (primary) hypertension: Secondary | ICD-10-CM | POA: Insufficient documentation

## 2023-08-06 DIAGNOSIS — Z87891 Personal history of nicotine dependence: Secondary | ICD-10-CM | POA: Diagnosis not present

## 2023-08-06 DIAGNOSIS — I700 Atherosclerosis of aorta: Secondary | ICD-10-CM | POA: Diagnosis not present

## 2023-08-06 DIAGNOSIS — Z79899 Other long term (current) drug therapy: Secondary | ICD-10-CM | POA: Insufficient documentation

## 2023-08-06 DIAGNOSIS — Z8042 Family history of malignant neoplasm of prostate: Secondary | ICD-10-CM | POA: Diagnosis not present

## 2023-08-06 DIAGNOSIS — Z833 Family history of diabetes mellitus: Secondary | ICD-10-CM | POA: Insufficient documentation

## 2023-08-06 LAB — CMP (CANCER CENTER ONLY)
ALT: 13 U/L (ref 0–44)
AST: 16 U/L (ref 15–41)
Albumin: 3.9 g/dL (ref 3.5–5.0)
Alkaline Phosphatase: 95 U/L (ref 38–126)
Anion gap: 6 (ref 5–15)
BUN: 10 mg/dL (ref 8–23)
CO2: 31 mmol/L (ref 22–32)
Calcium: 9.5 mg/dL (ref 8.9–10.3)
Chloride: 101 mmol/L (ref 98–111)
Creatinine: 0.71 mg/dL (ref 0.44–1.00)
GFR, Estimated: >60 mL/min (ref >60)
Glucose, Bld: 117 mg/dL — ABNORMAL HIGH (ref 70–99)
Potassium: 3.9 mmol/L (ref 3.5–5.1)
Sodium: 138 mmol/L (ref 135–145)
Total Bilirubin: 0.4 mg/dL (ref 0.3–1.2)
Total Protein: 7.3 g/dL (ref 6.5–8.1)

## 2023-08-06 LAB — CBC WITH DIFFERENTIAL (CANCER CENTER ONLY)
Abs Immature Granulocytes: 0.03 10*3/uL (ref 0.00–0.07)
Basophils Absolute: 0.1 10*3/uL (ref 0.0–0.1)
Basophils Relative: 1 %
Eosinophils Absolute: 0.3 10*3/uL (ref 0.0–0.5)
Eosinophils Relative: 6 %
HCT: 38.7 % (ref 36.0–46.0)
Hemoglobin: 13 g/dL (ref 12.0–15.0)
Immature Granulocytes: 1 %
Lymphocytes Relative: 21 %
Lymphs Abs: 1.1 10*3/uL (ref 0.7–4.0)
MCH: 30.5 pg (ref 26.0–34.0)
MCHC: 33.6 g/dL (ref 30.0–36.0)
MCV: 90.8 fL (ref 80.0–100.0)
Monocytes Absolute: 0.5 10*3/uL (ref 0.1–1.0)
Monocytes Relative: 10 %
Neutro Abs: 3.3 10*3/uL (ref 1.7–7.7)
Neutrophils Relative %: 61 %
Platelet Count: 196 10*3/uL (ref 150–400)
RBC: 4.26 MIL/uL (ref 3.87–5.11)
RDW: 12.8 % (ref 11.5–15.5)
WBC Count: 5.3 10*3/uL (ref 4.0–10.5)
nRBC: 0 % (ref 0.0–0.2)

## 2023-08-06 NOTE — Telephone Encounter (Signed)
Attempted to reach Tiffany [name] to relay appt. For MRCP scheduled for October 7 th. Left voicemail requesting call back.

## 2023-08-06 NOTE — Progress Notes (Signed)
[facility] [phone]   [phone]  CONSULT NOTE  REFERRING PHYSICIAN: Dr. Levy Pupa  REASON FOR CONSULTATION:  77 years old white female recently diagnosed with lung cancer.  HPI Tiffany [name] is a 77 y.o. female with past medical history significant for hypertension, dyslipidemia, MGUS, osteopenia, history of granulosa cell tumor s/p resection in 2007, basal cell carcinoma as well as long history of smoking but quit in 2019.  The patient was followed by the pulmonary office for the CT lung cancer screening and her scan on 04/29/2023 showed new part solid masslike lesion in the posterior segment of the right upper lobe.  This was followed by a PET scan on 06/10/2023 and that showed a mixed posterior right upper lobe mass measuring 3.1 x 2.7 cm consistent with a primary bronchogenic carcinoma.  There was a partially solid and partially cystic 12.4 cm anterior pelvic mass most likely arises from an ovary and suspicious for mucinous carcinoma.  There was also hypermetabolism corresponding to a soft tissue fullness about the pancreatic head and descending duodenum and differential included pancreatic head carcinoma or focal pancreatitis and further evaluation with MRI of the abdomen was recommended.  The patient had a CT super D of the chest by Dr. Delton Coombes on 07/03/2023 and that showed the subsolid mass in the posterior segment of the right upper lobe increased in size and measured 3.6 x 4.0 cm with an internal 1.5 x 2.7 cm solid component.  This was hypermetabolic on the PET scan and compatible with primary bronchogenic carcinoma.  There was a subsolid pulmonary nodule in the medial right lower lobe progressive from 04/29/2023 and worrisome for a synchronous primary bronchogenic carcinoma.  The patient underwent video bronchoscopy with robotic assisted bronchoscopic navigation under the care of Dr. Delton Coombes and the final pathology [id number]) was consistent with  adenocarcinoma.  She was also seen by Dr. Pricilla Holm with gynecologic oncology and she underwent robotic assisted radical dissection for pelvic mass, lysis of adhesions, cystoscopy cystoscopy.  The final pathology ([id number]) showed granulosa cell tumor, adult type with tumor necrosis, well-differentiated with a stage likely pT1c2.  The patient is supposed to see Dr. Emeline Darling such for treatment of her gynecologic malignancy.  She is also scheduled to have MRI of the abdomen for evaluation of the pancreatic mass on September 02, 2023.  She was referred to me today for evaluation and recommendation regarding the pulmonary nodules. When seen today the patient is feeling fine with no concerning complaints except for back pain after a fall.  She has no chest pain, shortness of breath, cough or hemoptysis.  She has no nausea, vomiting, diarrhea or constipation.  She has no headache or visual changes.  She denied having any recent weight loss or night sweats. Family history significant for father with hypertension.  Mother had brain aneurysm. The patient is married and has 1 son.  She used to work in [employer].  She was accompanied today by her husband Merle.  She has a history of smoking more than 1 pack/day for around 62 years and quit in 2019.  She has no history of alcohol or drug abuse.  HPI  Past Medical History:  Diagnosis Date   Abnormal PFT 09/02/2014   moderate restrictive ventilatory defect.   Arthritis    hip   BCC (basal cell carcinoma), face 2010   Dr. Terri Piedra   Colon polyp 12/2003   Diverticulosis 11/2010   seen on colonoscopy and air  contrast BE   Emphysema of lung (HCC)    Hyperlipidemia    Hypertension    previously treated with meds, resolved   MGUS (monoclonal gammopathy of unknown significance) 1999   prevously under care of Dr. 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   Tobacco abuse    quit in 2019    Past Surgical History:  Procedure Laterality Date   BASAL CELL CARCINOMA EXCISION  11/26/2008   L face, R temple   BILATERAL SALPINGOOPHORECTOMY  11/26/2005   cancerous tumor R ovary   BRONCHIAL BIOPSY  07/09/2023   Procedure: BRONCHIAL BIOPSIES;  Surgeon: Leslye Peer, MD;  Location: [facility] ENDOSCOPY;  Service: Pulmonary;;   BRONCHIAL BRUSHINGS  07/09/2023   Procedure: BRONCHIAL BRUSHINGS;  Surgeon: Leslye Peer, MD;  Location: [facility] ENDOSCOPY;  Service: Pulmonary;;   BRONCHIAL NEEDLE ASPIRATION BIOPSY  07/09/2023   Procedure: BRONCHIAL NEEDLE ASPIRATION BIOPSIES;  Surgeon: Leslye Peer, MD;  Location: [facility] ENDOSCOPY;  Service: Pulmonary;;   CATARACT EXTRACTION  11/27/2007   bilateral   COLONOSCOPY  11/26/2010   Dr. Laural Benes. Diverticulosis. BE also done--normal   FIDUCIAL MARKER PLACEMENT  07/09/2023   Procedure: FIDUCIAL MARKER PLACEMENT;  Surgeon: Leslye Peer, MD;  Location: [facility] ENDOSCOPY;  Service: Pulmonary;;   OMENTECTOMY  11/26/2005   and pelvic lymphadenectomy (with BSO)   ROBOTIC ASSISTED LAPAROSCOPIC LYSIS OF ADHESION N/A 07/18/2023   Procedure: ROBOTIC ASSISTED PELVIC MASS EXCISION WITH RADICAL DISSECTION, MINI LAPAROTOMY, CYSTOSCOPY;  Surgeon: Carver Fila, MD;  Location: [facility] ORS;  Service: Gynecology;  Laterality: N/A;   SKIN CANCER EXCISION Left 07/2021   neck; pt can't recall type of cancer   TONSILLECTOMY  age 15   VAGINAL HYSTERECTOMY  late 20's   "didn't want any more children"    Family History  Problem Relation Age of Onset   Stroke Mother    Cerebral aneurysm Mother    Hypertension Father    Suicidality Father    Diabetes Maternal Aunt    Diabetes Maternal Aunt    Breast cancer Maternal Aunt    Diabetes Maternal Aunt    Breast cancer Maternal Aunt        2 aunts   Throat cancer Maternal Uncle    Lung cancer Maternal Uncle     Prostate cancer Paternal Uncle    Colon cancer Neg Hx    Ovarian cancer Neg Hx    Endometrial cancer Neg Hx    Pancreatic cancer Neg Hx     Social History Social History   Tobacco Use   Smoking status: Former    Current packs/day: 0.00    Average packs/day: 2.0 packs/day for 59.8 years (119.6 ttl pk-yrs)    Types: Cigarettes    Start date: 16    Quit date: 09/10/2018    Years since quitting: 4.9    Passive exposure: Past (quit in 2019)   Smokeless tobacco: Never  Vaping Use   Vaping status: Some Days  Substance Use Topics   Alcohol use: Yes    Comment: rarely   Drug use: No    No Known Allergies  Current Outpatient Medications  Medication Sig Dispense Refill   albuterol (VENTOLIN HFA) 108 (90 Base) MCG/ACT  inhaler Inhale 2 puffs into the lungs every 6 (six) hours as needed for wheezing or shortness of breath. 8 g 1   apixaban (ELIQUIS) 2.5 MG TABS tablet Take 1 tablet (2.5 mg total) by mouth 2 (two) times daily. 26 tablet 0   cholecalciferol (VITAMIN D3) 25 MCG (1000 UNIT) tablet Take 1,000 Units by mouth daily.     Coenzyme Q10 (COQ10) 100 MG CAPS Take 100 mg by mouth daily.     losartan (COZAAR) 50 MG tablet Take 1 tablet (50 mg total) by mouth daily. 90 tablet 3   rosuvastatin (CRESTOR) 10 MG tablet Take 1 tablet (10 mg total) by mouth daily. 90 tablet 3   senna-docusate (SENOKOT-S) 8.6-50 MG tablet Take 2 tablets by mouth at bedtime. For AFTER surgery, do not take if having diarrhea- surgery 07/18/23     Tiotropium Bromide-Olodaterol (STIOLTO RESPIMAT) 2.5-2.5 MCG/ACT AERS Inhale 2 puffs into the lungs daily. (Patient taking differently: Inhale 2 puffs into the lungs every evening.) 4 g 11   traMADol (ULTRAM) 50 MG tablet Take 1 tablet (50 mg total) by mouth every 6 (six) hours as needed for severe pain. For AFTER surgery only, do not take and drive 10 tablet 0   No current facility-administered medications for this visit.    Review of Systems  Constitutional:  positive for fatigue Eyes: negative Ears, nose, mouth, throat, and face: negative Respiratory: negative Cardiovascular: negative Gastrointestinal: negative Genitourinary:negative Integument/breast: negative Hematologic/lymphatic: negative Musculoskeletal:positive for back pain Neurological: negative Behavioral/Psych: negative Endocrine: negative Allergic/Immunologic: negative  Physical Exam  BJY:NWGNF, healthy, no distress, well nourished, well developed, and anxious SKIN: skin color, texture, turgor are normal, no rashes or significant lesions HEAD: Normocephalic, No masses, lesions, tenderness or abnormalities EYES: normal, PERRLA, Conjunctiva are pink and non-injected EARS: External ears normal, Canals clear OROPHARYNX:no exudate, no erythema, and lips, buccal mucosa, and tongue normal  NECK: supple, no adenopathy, no JVD LYMPH:  no palpable lymphadenopathy, no hepatosplenomegaly BREAST:not examined LUNGS: clear to auscultation , and palpation HEART: regular rate & rhythm, no murmurs, and no gallops ABDOMEN:abdomen soft, non-tender, normal bowel sounds, and no masses or organomegaly BACK: Back symmetric, no curvature., No CVA tenderness EXTREMITIES:no joint deformities, effusion, or inflammation, no edema  NEURO: alert & oriented x 3 with fluent speech, no focal motor/sensory deficits  PERFORMANCE STATUS: ECOG 1  LABORATORY DATA: Lab Results  Component Value Date   WBC 7.0 07/23/2023   HGB 13.4 07/23/2023   HCT 40.3 07/23/2023   MCV 90.8 07/23/2023   PLT 245 07/23/2023      Chemistry      Component Value Date/Time   NA 136 07/23/2023 1242   NA 137 05/08/2023 0953   K 4.1 07/23/2023 1242   CL 100 07/23/2023 1242   CO2 28 07/23/2023 1242   BUN 13 07/23/2023 1242   BUN 17 05/08/2023 0953   CREATININE 0.56 07/23/2023 1242   CREATININE 0.72 12/02/2017 0945      Component Value Date/Time   CALCIUM 9.4 07/23/2023 1242   ALKPHOS 89 07/12/2023 1014   AST 20  07/12/2023 1014   ALT 18 07/12/2023 1014   BILITOT 0.7 07/12/2023 1014   BILITOT 0.5 05/08/2023 0953       RADIOGRAPHIC STUDIES: DG Cystogram  Result Date: 07/26/2023 CLINICAL DATA:  Status post partial bladder resection due to adjacent pelvic mass. EXAM: CYSTOGRAM TECHNIQUE: The bladder was filled with 200 mL Cysto-Hypaque 30% by drip infusion through pre-existing Foley catheter. Serial spot images were obtained during  bladder filling and post draining. FLUOROSCOPY: Radiation Exposure Index (as provided by the fluoroscopic device): 43.7 mGy Kerma COMPARISON:  None Available. FINDINGS: Normal contrast filling of the urinary bladder is noted. No leakage or extravasation is noted. Postvoid exam demonstrated minimal residual contrast within urinary bladder. IMPRESSION: No evidence of bladder leakage. Electronically Signed   By: Lupita Raider M.D.   On: 07/26/2023 10:12   DG Chest 2 View  Result Date: 07/19/2023 CLINICAL DATA:  Oxygen desaturation. EXAM: CHEST - 2 VIEW COMPARISON:  Chest x-ray 07/09/2023. FINDINGS: Nodular right midlung opacity with clip, better characterized on recent CT chest. Similar coarsened lung markings and emphysema. No new consolidation. No visible pleural effusions or pneumothorax. Biapical pleuroparenchymal scarring. Similar cardiomediastinal silhouette. Subcutaneous emphysema in the left greater than right chest walls. IMPRESSION: 1. Nodular right midlung opacity with clip, better characterized on recent CT chest. 2. Similar emphysema and coarse in the lung markings without definite superimposed interval acute cardiopulmonary abnormality. 3. Subcutaneous emphysema in the left greater than right chest walls, most likely postoperative. Electronically Signed   By: Feliberto Harts M.D.   On: 07/19/2023 16:27   DG Chest Port 1 View  Result Date: 07/09/2023 CLINICAL DATA:  Post bronchoscopy with biopsy. EXAM: PORTABLE CHEST 1 VIEW COMPARISON:  CT chest 07/03/2023.  Chest  radiograph 01/08/2022 FINDINGS: Heart size and pulmonary vascularity are normal. Diffuse emphysematous changes throughout the lungs with diffuse interstitial pattern likely representing fibrosis. Vague nodular opacity in the right mid lung with surgical clip likely corresponds to the area of biopsy. No pleural effusion or pneumothorax. Mediastinal contours appear intact. Calcification of the aorta. IMPRESSION: 1. No pleural effusion or pneumothorax. 2. Vague nodular opacity in the right mid lung with surgical clip likely representing site of biopsy. 3. Diffuse emphysema and fibrosis in the lungs. Electronically Signed   By: Burman Nieves M.D.   On: 07/09/2023 15:11   DG C-ARM BRONCHOSCOPY  Result Date: 07/09/2023 C-ARM BRONCHOSCOPY: Fluoroscopy was utilized by the requesting physician.  No radiographic interpretation.    ASSESSMENT: This is a very pleasant 77 years old white female recently diagnosed with at least stage Ib (t2a, N0, M0) non-small cell lung cancer, adenocarcinoma presented with right upper lobe hypermetabolic lung mass confirmed with biopsy.  There was also an additional suspicious right lower lobe lung nodule that was not hypermetabolic on the recent PET scan.  The patient also has gynecologic malignancy with granulosa cell tumor status post resection and she may require additional systemic therapy by Dr. Bertis Ruddy. The patient also has a soft tissue lesion in the pancreatic area and she is scheduled to have MRI of the abdomen in early October 2024.   PLAN: I had a lengthy discussion with the patient today about her current condition and treatment options.  I personally and independently reviewed her imaging studies and discussed the result with the patient and her husband. Regarding the adenocarcinoma of the lung, I recommended for the patient to see radiation oncology for consideration of curative SBRT to the right upper lobe lung mass and possibly the suspicious right lower lobe lung  nodule.  I will arrange for the patient a follow-up appointment with me in 4 months for evaluation and repeat CT scan of the chest for restaging of her condition after the curative radiotherapy. For the granulosa cell tumor, the patient will need an appointment with Dr. Emeline Darling such for evaluation and discussion of any systemic treatment options if needed. For the pancreatic soft tissue mass, we  will wait for the MRI of the abdomen before making any additional recommendation regarding biopsy or treatment. The patient and her husband are in agreement with the current plan. She was advised to call immediately if she has any other concerning symptoms in the interval. The patient voices understanding of current disease status and treatment options and is in agreement with the current care plan.  All questions were answered. The patient knows to call the clinic with any problems, questions or concerns. We can certainly see the patient much sooner if necessary.  Thank you so much for allowing me to participate in the care of [facility]. I will continue to follow up the patient with you and assist in her care.  The total time spent in the appointment was 90 minutes.  Disclaimer: This note was dictated with voice recognition software. Similar sounding words can inadvertently be transcribed and may not be corrected upon review.   Lajuana Matte August 06, 2023, 1:58 PM

## 2023-08-06 NOTE — Telephone Encounter (Signed)
Spoke with Tiffany [name] and relayed message from Warner Mccreedy, NP that the gastroenterologist is recommending a special MRI before your appt. On October 25 th to discuss the results with you. The special MRI is called a MRCP. We will get this scheduled for you and the office will call back with a date. Pt thanked the office for calling and states she doesn't want any early morning appointments.

## 2023-08-06 NOTE — Addendum Note (Signed)
Addended by: Otho Bellows, Mattie Novosel E on: 08/06/2023 02:22 PM   Modules accepted: Level of Service

## 2023-08-06 NOTE — Telephone Encounter (Signed)
-----   Message from Doylene Bode sent at 08/06/2023 11:42 AM EDT ----- This patient is set up to see gastroenterology on 09/20/2023 for evaluation of the upper abdomen/pancreas since they saw an area of "fullness" on imaging during her workup.  The gastroenterologist is recommending having a special MRI before you come to your visit so they will be able to discuss the results with you. This is called an MRCP. We will get this ordered for you and scheduled before her visit with GI.  Please call her and let her know the above. Once you have spoken with her, I will sign her order the MRCP so we can get it scheduled.  Thanks

## 2023-08-06 NOTE — Progress Notes (Unsigned)
I met with the pt face to face today at her consult appt with Dr.Mohamed. She was accompanied by her husband, Tiffany [name]. The plan for the patient is a referral to Radiation Oncology for her lung mass. She will be undergoing chemotherapy under the care of Dr.Gorsuch for her pelvic mass. She is supposed to see Gastroenterology in October to work up the hypermetabolic area seen in her pancreas. Dr Arbutus Ped would like to see her back in 4 months with a follow up CT prior. If the area in her pancreas turns out to be r/t her lung cancer, we will see her back sooner. Pt verbalizes understanding of plan of care. I provided the pt with my business card with contact information and encouraged her to call with any questions or concerns.

## 2023-08-07 NOTE — Telephone Encounter (Signed)
Spoke with Tiffany [name] and she is aware of her scheduled MRCP on October 7th. Pt states Tiffany [name] called her and she knows to arrive at 3:30 and nothing to eat or drink 4 hours prior to test. Advised patient to call the office with any concerns or questions.

## 2023-08-08 NOTE — Progress Notes (Signed)
Thoracic Location of Tumor / Histology:  NM PET Image Initial (PI) Skull Base To Thigh 06/10/2023  IMPRESSION: 1. Mixed attenuation right upper lobe lung mass is hypermetabolic, most consistent with primary bronchogenic carcinoma. 2. A partially solid, partially cystic 12.4 cm anterior pelvic mass most likely arises from an ovary and is suspicious for mucinous carcinoma. Consider gynecological consultation. 3. Hypermetabolism corresponding to soft tissue fullness about the pancreatic head and descending duodenum. Considerations include pancreatic head carcinoma or focal pancreatitis. Consider further evaluation with pancreatic protocol pre and post contrast abdominal CT or MRI. 4. Incidental findings, including: Aortic atherosclerosis (ICD10-I70.0), coronary artery atherosclerosis and emphysema (ICD10-J43.9). Sinus disease.  CT Super D Chest Wo Contrast 07/03/2023  IMPRESSION: 1. Subsolid mass posterior segment right upper lobe, increased in size from 04/29/2023 and hypermetabolic on PET, compatible with primary bronchogenic carcinoma. 2. Subsolid pulmonary nodule in the medial right lower lobe, progressive from 04/29/2023, worrisome for a synchronous primary bronchogenic carcinoma. 3. Aortic atherosclerosis (ICD10-I70.0). Coronary artery calcification. 4.  Emphysema (ICD10-J43.9).  Patient presented with symptoms of:Marland Kitchen Found on imaging  Biopsies revealed:  07-09-23 FINAL MICROSCOPIC DIAGNOSIS:  A. LUNG, RML, FINE NEEDLE ASPIRATION:  - Adenocarcinoma   B. LUNG, RML, BRUSHING:  - Adenocarcinoma   SPECIMEN ADEQUACY:  A. Satisfactory for Evaluation  B. Satisfactory for Evaluation   Tobacco/Marijuana/Snuff/ETOH use:  long history of smoking but quit in 2019  Past/Anticipated interventions by cardiothoracic surgery, if any:  Video Bronchoscopy with Robotic Assisted Bronchoscopic Navigation    Date of Operation: 07/09/2023    Pre-op Diagnosis: Right middle lobe mixed density  mass   Post-op Diagnosis: Right middle lobe mixed density mass   Surgeon: Levy Pupa   Past/Anticipated interventions by medical oncology, if any:  Si Gaul, MD 08/06/2023  HPI Tiffany [name] is a 77 y.o. female with past medical history significant for hypertension, dyslipidemia, MGUS, osteopenia, history of granulosa cell tumor s/p resection in 2007, basal cell carcinoma as well as long history of smoking but quit in 2019.  The patient was followed by the pulmonary office for the CT lung cancer screening and her scan on 04/29/2023 showed new part solid masslike lesion in the posterior segment of the right upper lobe.  This was followed by a PET scan on 06/10/2023 and that showed a mixed posterior right upper lobe mass measuring 3.1 x 2.7 cm consistent with a primary bronchogenic carcinoma.  There was a partially solid and partially cystic 12.4 cm anterior pelvic mass most likely arises from an ovary and suspicious for mucinous carcinoma.  There was also hypermetabolism corresponding to a soft tissue fullness about the pancreatic head and descending duodenum and differential included pancreatic head carcinoma or focal pancreatitis and further evaluation with MRI of the abdomen was recommended.  The patient had a CT super D of the chest by Dr. Delton Coombes on 07/03/2023 and that showed the subsolid mass in the posterior segment of the right upper lobe increased in size and measured 3.6 x 4.0 cm with an internal 1.5 x 2.7 cm solid component.  This was hypermetabolic on the PET scan and compatible with primary bronchogenic carcinoma.  There was a subsolid pulmonary nodule in the medial right lower lobe progressive from 04/29/2023 and worrisome for a synchronous primary bronchogenic carcinoma.  The patient underwent video bronchoscopy with robotic assisted bronchoscopic navigation under the care of Dr. Delton Coombes and the final pathology [id number]) was consistent with adenocarcinoma.  She was also seen by Dr.  Pricilla Holm with gynecologic oncology and she  underwent robotic assisted radical dissection for pelvic mass, lysis of adhesions, cystoscopy cystoscopy.  The final pathology ([id number]) showed granulosa cell tumor, adult type with tumor necrosis, well-differentiated with a stage likely pT1c2.  The patient is supposed to see Dr. Emeline Darling such for treatment of her gynecologic malignancy.  She is also scheduled to have MRI of the abdomen for evaluation of the pancreatic mass on September 02, 2023.  She was referred to me today for evaluation and recommendation regarding the pulmonary nodules. When seen today the patient is feeling fine with no concerning complaints except for back pain after a fall.  She has no chest pain, shortness of breath, cough or hemoptysis.  She has no nausea, vomiting, diarrhea or constipation.  She has no headache or visual changes.  She denied having any recent weight loss or night sweats. Family history significant for father with hypertension.  Mother had brain aneurysm. The patient is married and has 1 son.  She used to work in [employer].  She was accompanied today by her husband Tiffany [name].  She has a history of smoking more than 1 pack/day for around 62 years and quit in 2019.  She has no history of alcohol or drug abuse.  ASSESSMENT: This is a very pleasant 77 years old white female recently diagnosed with at least stage Ib (t2a, N0, M0) non-small cell lung cancer, adenocarcinoma presented with right upper lobe hypermetabolic lung mass confirmed with biopsy.  There was also an additional suspicious right lower lobe lung nodule that was not hypermetabolic on the recent PET scan.  The patient also has gynecologic malignancy with granulosa cell tumor status post resection and she may require additional systemic therapy by Dr. Bertis Ruddy. The patient also has a soft tissue lesion in the pancreatic area and she is scheduled to have MRI of the abdomen in early October 2024.     PLAN: I had a  lengthy discussion with the patient today about her current condition and treatment options.  I personally and independently reviewed her imaging studies and discussed the result with the patient and her husband. Regarding the adenocarcinoma of the lung, I recommended for the patient to see radiation oncology for consideration of curative SBRT to the right upper lobe lung mass and possibly the suspicious right lower lobe lung nodule.  I will arrange for the patient a follow-up appointment with me in 4 months for evaluation and repeat CT scan of the chest for restaging of her condition after the curative radiotherapy. For the granulosa cell tumor, the patient will need an appointment with Dr. Emeline Darling such for evaluation and discussion of any systemic treatment options if needed. For the pancreatic soft tissue mass, we will wait for the MRI of the abdomen before making any additional recommendation regarding biopsy or treatment. The patient and her husband are in agreement with the current plan. She was advised to call immediately if she has any other concerning symptoms in the interval. The patient voices understanding of current disease status and treatment options and is in agreement with the current care plan.  Signs/Symptoms Weight changes, if any: yes, patient has lost weight Wt Readings from Last 3 Encounters:  08/14/23 139 lb 6.4 oz (63.2 kg)  08/06/23 141 lb (64 kg)  07/26/23 143 lb (64.9 kg)    Respiratory complaints, if any: yes, productive cough and shortness of breath on exertion.  Hemoptysis, if any: no Pain issues, if any:  yes, patient reports having low back pain that started  after a fall in May 2024.   SAFETY ISSUES: Prior radiation? no Pacemaker/ICD? no  Possible current pregnancy?no Is the patient on methotrexate? no  Current Complaints / other details:    BP 127/60 (BP Location: Left Arm, Patient Position: Sitting, Cuff Size: Normal)   Pulse 73   Temp (!) 97.5 F (36.4  C)   Resp 20   Ht 5' 5.5" (1.664 m)   Wt 139 lb 6.4 oz (63.2 kg)   SpO2 97%   BMI 22.84 kg/m

## 2023-08-08 NOTE — Progress Notes (Signed)
Opened in Error.

## 2023-08-13 NOTE — Progress Notes (Signed)
Radiation Oncology         [phone] ________________________________  Initial Outpatient Consultation  Name: Tiffany [name] MRN: [id number]  Date: 08/14/2023  DOB: 09-20-1946  UY:[id number], Tiffany Critchley, MD  Si Gaul, MD   REFERRING PHYSICIAN: Si Gaul, MD  DIAGNOSIS: The primary encounter diagnosis was Malignant neoplasm of right upper lobe of lung (HCC). A diagnosis of Adenocarcinoma of upper lobe of right lung [facility]) was also pertinent to this visit.  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 Presented with right upper lobe hypermetabolic lung mass as well as an additional suspicious right lower lobe lung nodule that was not hypermetabolic on PET imaging. 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 female who is seen as a courtesy of Dr. Arbutus Ped for an opinion concerning radiation therapy as part of management for her recently diagnosed right lung cancer. The patient has a significant 50 pack year smoking history and quit smoking in 2019. She subsequently participates in the lung cancer screening program. She also has a history of COPD with recurrent exacerbations and baseline SOB, followed by Dr. Everardo All at [facility]. Concurrently with her lung cancer diagnosis, the patient was also diagnosed with recurrent granulosa cell tumor (history detailed below lung cancer history).   Right lung cancer:    She presented for a lung cancer screening CT scan on 04/29/23 which demonstrated a new part solid mass like lesion in the posterior RUL (Lung-RADS 4B, suspicious) measuring 28.6 mm and with an internal solid appearing component measuring 16.8 mm. CT also showed a new nodule in the superior segment RLL measuring approximately 15.2 mm, and several additional nodules measuring 5.1 mm.   Dr. Everardo All reviewed these findings and recommended further  work-up. Subsequent PET scan on 06/10/23 demonstrated hypermetabolism associated with the RUL nodule/mass measuring 3.1 x 2.7 cm concerning for primary bronchogenic carcinoma, a partially solid/partially cystic 12.4 cm anterior pelvic mass most likely arising from an ovary suspicious for mucinous carcinoma, and hypermetabolism corresponding to soft tissue fullness about the pancreatic head and descending duodenum, indeterminate for pancreatic head carcinoma vs focal pancreatitis.   Super-D chest CT on 08/07 showed an interval increase in size of the RUL mass, measuring 4.0 cm, as well as an interval increase in size of the RLL nodule measuring 1.3 cm. The additional scattered tiny pulmonary nodules otherwise appeared stable. Imaging otherwise showed no evidence of mediastinal, hilar, or axillary adenopathy.   Accordingly, the patient was referred to Dr. Delton Coombes and opted to proceed with bronchoscopy with biopsies on 07/09/23. Although the mass is located in the RUL, biopsies were collected of the RML and showed findings consistent with adenocarcinoma. The patient's case was presented at the thoracic tumor board on 08/15 and concern was noted regarding the questionable location of the biopsy specimen.   Subsequently, the patient was referred to Dr. Arbutus Ped on 08/06/23 for further evaluation and management. Dr. Arbutus Ped has ultimately recommended SBRT to the right upper lobe lung mass and possibly to the suspicious right lower lobe lung nodule.   Recently diagnosed recurrent gynecologic malignancy:   The patient has a past history notable for stage IA granulosa cell tumor diagnosed in 2007, s/p ex lap, bilateral salpingo-oophorectomy, pelvic lymphadenectomy, and omentectomy under Dr. Stanford Breed. She was previously followed by [facility] and was noted at [facility] during her last visit with them in 2012.   As noted  above, the patient's PET scan/24 from 07/15 incidentally revealed a partially solid/partially  cystic 12.4 cm anterior pelvic mass, most likely arising from an ovary, suspicious for mucinous carcinoma.   She was referred to Dr. Pricilla Holm and opted to proceed with radical dissection of the pelvic mass with lysis of adhesions on 07/18/23. Pathology revealed granulosa cell tumor, adult type, with tumor necrosis; visually negative margins but with local invasion into the surrounding structures.   Dr. Pricilla Holm has recommended systemic therapy for her recurrent granulosa cell tumor. Treatment for this will however depend on when and if she receives chemotherapy for her recently diagnosed lung cancer. (Her case was also presented at the gyn-onc tumor board on 09/09. Disposition concluded is to systemic chemotherapy and hormonal therapy).   Dr. Arbutus Ped also reviewed treatment indications for the  granulosa cell tumor during her consultation visit on 09/10. Dr. Arbutus Ped states that she will need an appointment with Dr. Bertis Ruddy for evaluation and discussion of any systemic treatment options if needed.   For the pancreatic soft tissue mass also noted on her PET scan, Dr. Arbutus Ped recommends waiting for results from her MRI of the abdomen before making any additional recommendation regarding biopsy or treatment. Her abdominal MRI is currently scheduled for 09/02/23.    PREVIOUS RADIATION THERAPY: No  PAST MEDICAL HISTORY:  Past Medical History:  Diagnosis Date   Abnormal PFT 09/02/2014   moderate restrictive ventilatory defect.   Arthritis    hip   BCC (basal cell carcinoma), face 2010   Dr. Terri Piedra   Colon polyp 12/2003   Diverticulosis 11/2010   seen on colonoscopy and air contrast BE   Emphysema of lung (HCC)    Hyperlipidemia    Hypertension    previously treated with meds, resolved   MGUS (monoclonal gammopathy of unknown significance) 1999   prevously under care of Dr. 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   Tobacco abuse    quit in 2019    PAST SURGICAL HISTORY: Past Surgical History:  Procedure Laterality Date   BASAL CELL CARCINOMA EXCISION  11/26/2008   L face, R temple   BILATERAL SALPINGOOPHORECTOMY  11/26/2005   cancerous tumor R ovary   BRONCHIAL BIOPSY  07/09/2023   Procedure: BRONCHIAL BIOPSIES;  Surgeon: Leslye Peer, MD;  Location: [facility] ENDOSCOPY;  Service: Pulmonary;;   BRONCHIAL BRUSHINGS  07/09/2023   Procedure: BRONCHIAL BRUSHINGS;  Surgeon: Leslye Peer, MD;  Location: [facility] ENDOSCOPY;  Service: Pulmonary;;   BRONCHIAL NEEDLE ASPIRATION BIOPSY  07/09/2023   Procedure: BRONCHIAL NEEDLE ASPIRATION BIOPSIES;  Surgeon: Leslye Peer, MD;  Location: [facility] ENDOSCOPY;  Service: Pulmonary;;   CATARACT EXTRACTION  11/27/2007   bilateral   COLONOSCOPY  11/26/2010   Dr. Laural Benes. Diverticulosis. BE also done--normal   FIDUCIAL MARKER PLACEMENT  07/09/2023   Procedure: FIDUCIAL MARKER PLACEMENT;  Surgeon: Leslye Peer, MD;  Location: [facility] ENDOSCOPY;  Service: Pulmonary;;   OMENTECTOMY  11/26/2005   and pelvic lymphadenectomy (with BSO)   ROBOTIC ASSISTED LAPAROSCOPIC LYSIS OF ADHESION N/A 07/18/2023   Procedure: ROBOTIC ASSISTED PELVIC MASS EXCISION WITH RADICAL DISSECTION, MINI LAPAROTOMY, CYSTOSCOPY;  Surgeon: Carver Fila, MD;  Location: [facility] ORS;  Service: Gynecology;  Laterality: N/A;  SKIN CANCER EXCISION Left 07/2021   neck; pt can't recall type of cancer   TONSILLECTOMY  age 69   VAGINAL HYSTERECTOMY  late 20's   "didn't want any more children"    FAMILY HISTORY:  Family History  Problem Relation Age of Onset   Stroke Mother    Cerebral aneurysm Mother    Hypertension Father    Suicidality Father    Diabetes Maternal Aunt    Diabetes Maternal Aunt    Breast cancer Maternal Aunt    Diabetes Maternal Aunt    Breast cancer Maternal Aunt        2  aunts   Throat cancer Maternal Uncle    Lung cancer Maternal Uncle    Prostate cancer Paternal Uncle    Colon cancer Neg Hx    Ovarian cancer Neg Hx    Endometrial cancer Neg Hx    Pancreatic cancer Neg Hx     SOCIAL HISTORY:  Social History   Tobacco Use   Smoking status: Former    Current packs/day: 0.00    Average packs/day: 2.0 packs/day for 59.8 years (119.6 ttl pk-yrs)    Types: Cigarettes    Start date: 1    Quit date: 09/10/2018    Years since quitting: 4.9    Passive exposure: Past (quit in 2019)   Smokeless tobacco: Never  Vaping Use   Vaping status: Some Days  Substance Use Topics   Alcohol use: Yes    Comment: rarely   Drug use: No    ALLERGIES: No Known Allergies  MEDICATIONS:  Current Outpatient Medications  Medication Sig Dispense Refill   albuterol (VENTOLIN HFA) 108 (90 Base) MCG/ACT inhaler Inhale 2 puffs into the lungs every 6 (six) hours as needed for wheezing or shortness of breath. 8 g 1   cholecalciferol (VITAMIN D3) 25 MCG (1000 UNIT) tablet Take 1,000 Units by mouth daily.     Coenzyme Q10 (COQ10) 100 MG CAPS Take 100 mg by mouth daily.     losartan (COZAAR) 50 MG tablet Take 1 tablet (50 mg total) by mouth daily. 90 tablet 3   rosuvastatin (CRESTOR) 10 MG tablet Take 1 tablet (10 mg total) by mouth daily. 90 tablet 3   senna-docusate (SENOKOT-S) 8.6-50 MG tablet Take 2 tablets by mouth at bedtime. For AFTER surgery, do not take if having diarrhea- surgery 07/18/23     Tiotropium Bromide-Olodaterol (STIOLTO RESPIMAT) 2.5-2.5 MCG/ACT AERS Inhale 2 puffs into the lungs daily. (Patient taking differently: Inhale 2 puffs into the lungs every evening.) 4 g 11   traMADol (ULTRAM) 50 MG tablet Take 1 tablet (50 mg total) by mouth every 6 (six) hours as needed for severe pain. For AFTER surgery only, do not take and drive (Patient not taking: Reported on 08/14/2023) 10 tablet 0   No current facility-administered medications for this encounter.     REVIEW OF SYSTEMS:  A 10+ POINT REVIEW OF SYSTEMS WAS OBTAINED including neurology, dermatology, psychiatry, cardiac, respiratory, lymph, extremities, GI, GU, musculoskeletal, constitutional, reproductive, HEENT.  She denies any pain along the right chest area significant cough or hemoptysis.  She denies any significant problems with her breathing.   PHYSICAL EXAM:  height is 5' 5.5" (1.664 m) and weight is 139 lb 6.4 oz (63.2 kg). Her temperature is 97.5 F (36.4 C) (abnormal). Her blood pressure is 127/60 and her pulse is 73. Her respiration is 20 and oxygen saturation is 97%.   General: Alert and oriented, in no acute distress  HEENT: Head is normocephalic. Extraocular movements are intact.  Neck: Neck is supple, no palpable cervical or supraclavicular lymphadenopathy. Heart: Regular in rate and rhythm with no murmurs, rubs, or gallops. Chest: Clear to auscultation bilaterally, with no rhonchi, wheezes, or rales. Abdomen: Soft, nontender, nondistended, with no rigidity or guarding. Extremities: No cyanosis or edema. Lymphatics: see Neck Exam Skin: No concerning lesions. Musculoskeletal: symmetric strength and muscle tone throughout. Neurologic: Cranial nerves II through XII are grossly intact. No obvious focalities. Speech is fluent. Coordination is intact. Psychiatric: Judgment and insight are intact. Affect is appropriate.   ECOG = 1  0 - Asymptomatic (Fully active, able to carry on all predisease activities without restriction)  1 - Symptomatic but completely ambulatory (Restricted in physically strenuous activity but ambulatory and able to carry out work of a light or sedentary nature. For example, light housework, office work)  2 - Symptomatic, <50% in bed during the day (Ambulatory and capable of all self care but unable to carry out any work activities. Up and about more than 50% of waking hours)  3 - Symptomatic, >50% in bed, but not bedbound (Capable of only limited  self-care, confined to bed or chair 50% or more of waking hours)  4 - Bedbound (Completely disabled. Cannot carry on any self-care. Totally confined to bed or chair)  5 - Death   Santiago Glad MM, Creech RH, Tormey DC, et al. [phone]). "Toxicity and response criteria of the [facility]". Am. Evlyn Clines. Oncol. 5 (6): 649-55  LABORATORY DATA:  Lab Results  Component Value Date   WBC 5.3 08/06/2023   HGB 13.0 08/06/2023   HCT 38.7 08/06/2023   MCV 90.8 08/06/2023   PLT 196 08/06/2023   NEUTROABS 3.3 08/06/2023   Lab Results  Component Value Date   NA 138 08/06/2023   K 3.9 08/06/2023   CL 101 08/06/2023   CO2 31 08/06/2023   GLUCOSE 117 (H) 08/06/2023   BUN 10 08/06/2023   CREATININE 0.71 08/06/2023   CALCIUM 9.5 08/06/2023      RADIOGRAPHY: DG Cystogram  Result Date: 07/26/2023 CLINICAL DATA:  Status post partial bladder resection due to adjacent pelvic mass. EXAM: CYSTOGRAM TECHNIQUE: The bladder was filled with 200 mL Cysto-Hypaque 30% by drip infusion through pre-existing Foley catheter. Serial spot images were obtained during bladder filling and post draining. FLUOROSCOPY: Radiation Exposure Index (as provided by the fluoroscopic device): 43.7 mGy Kerma COMPARISON:  None Available. FINDINGS: Normal contrast filling of the urinary bladder is noted. No leakage or extravasation is noted. Postvoid exam demonstrated minimal residual contrast within urinary bladder. IMPRESSION: No evidence of bladder leakage. Electronically Signed   By: Lupita Raider M.D.   On: 07/26/2023 10:12   DG Chest 2 View  Result Date: 07/19/2023 CLINICAL DATA:  Oxygen desaturation. EXAM: CHEST - 2 VIEW COMPARISON:  Chest x-ray 07/09/2023. FINDINGS: Nodular right midlung opacity with clip, better characterized on recent CT chest. Similar coarsened lung markings and emphysema. No new consolidation. No visible pleural effusions or pneumothorax. Biapical pleuroparenchymal scarring. Similar  cardiomediastinal silhouette. Subcutaneous emphysema in the left greater than right chest walls. IMPRESSION: 1. Nodular right midlung opacity with clip, better characterized on recent CT chest. 2. Similar emphysema and coarse in the lung markings without definite superimposed interval acute cardiopulmonary abnormality. 3. Subcutaneous emphysema in the left greater than right chest walls, most likely postoperative. Electronically Signed   By: Feliberto Harts M.D.   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 Presented with right upper lobe hypermetabolic lung mass as well as an additional suspicious right lower lobe lung nodule that was not hypermetabolic on PET imaging. 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  In particular the patient would be a candidate for SBRT with 3-5 radiation treatments anticipated.  She would be a potential surgical candidate but given her recent surgery and possible issue with the pancreas she does not wish to consider a surgical approach for her lung cancer.  We discussed that we could proceed with radiation planning and treatment in the near future with her treatments concerning her lung cancer with this completed prior to initiation of anticipated chemotherapy for her recurrent granulosa cell tumor and management a potential pancreatic issue.  She wishes to proceed with planning and treatment concerning her lung cancer as soon as possible.  We also discussed potential treatment of the smaller, not biopsied lesion in the right medial lung but she would like to hold off on treatment to this area.  Today, I talked to the patient and family about the findings and work-up thus far.  We discussed the natural history of non-small  cell lung cancer and general treatment, highlighting the role of radiotherapy in the management.  We discussed the available radiation techniques, and focused on the details of logistics and delivery.  We reviewed the anticipated acute and late sequelae associated with radiation in this setting.  The patient was encouraged to ask questions that I answered to the best of my ability.  A patient consent form was discussed and signed.  We retained a copy for our records.  The patient would like to proceed with radiation and will be scheduled for CT simulation.  PLAN: She will proceed with CT simulation next week.  Anticipate treatments beginning approximately a week later.  Anticipate 3 SBRT sessions directed at the biopsy-proven adenocarcinoma in the right middle lobe.   65 minutes of total time was spent for this patient encounter, including preparation, face-to-face counseling with the patient and coordination of care, physical exam, and documentation of the encounter.   ------------------------------------------------  Billie Lade, PhD, MD  This document serves as a record of services personally performed by Antony Blackbird, MD. It was created on his behalf by Neena Rhymes, a trained medical scribe. The creation of this record is based on the scribe's personal observations and the provider's statements to them. This document has been checked and approved by the attending provider.

## 2023-08-14 ENCOUNTER — Ambulatory Visit
Admission: RE | Admit: 2023-08-14 | Discharge: 2023-08-14 | Disposition: A | Discharge status: Home / Self Care | Payer: Medicare Other | Source: Ambulatory Visit | Attending: Radiation Oncology | Admitting: Radiation Oncology

## 2023-08-14 ENCOUNTER — Encounter: Payer: Self-pay | Admitting: Radiation Oncology

## 2023-08-14 VITALS — BP 127/60 | HR 73 | Temp 97.5°F | Resp 20 | Ht 65.5 in | Wt 139.4 lb

## 2023-08-14 DIAGNOSIS — D472 Monoclonal gammopathy: Secondary | ICD-10-CM | POA: Insufficient documentation

## 2023-08-14 DIAGNOSIS — E785 Hyperlipidemia, unspecified: Secondary | ICD-10-CM | POA: Diagnosis not present

## 2023-08-14 DIAGNOSIS — Z800 Family history of malignant neoplasm of digestive organs: Secondary | ICD-10-CM | POA: Insufficient documentation

## 2023-08-14 DIAGNOSIS — J439 Emphysema, unspecified: Secondary | ICD-10-CM | POA: Diagnosis not present

## 2023-08-14 DIAGNOSIS — M8580 Other specified disorders of bone density and structure, unspecified site: Secondary | ICD-10-CM | POA: Diagnosis not present

## 2023-08-14 DIAGNOSIS — Z85828 Personal history of other malignant neoplasm of skin: Secondary | ICD-10-CM | POA: Insufficient documentation

## 2023-08-14 DIAGNOSIS — Z79899 Other long term (current) drug therapy: Secondary | ICD-10-CM | POA: Diagnosis not present

## 2023-08-14 DIAGNOSIS — C3411 Malignant neoplasm of upper lobe, right bronchus or lung: Secondary | ICD-10-CM | POA: Diagnosis not present

## 2023-08-14 DIAGNOSIS — Z801 Family history of malignant neoplasm of trachea, bronchus and lung: Secondary | ICD-10-CM | POA: Insufficient documentation

## 2023-08-14 DIAGNOSIS — Z86010 Personal history of colonic polyps: Secondary | ICD-10-CM | POA: Insufficient documentation

## 2023-08-14 DIAGNOSIS — Z803 Family history of malignant neoplasm of breast: Secondary | ICD-10-CM | POA: Insufficient documentation

## 2023-08-14 DIAGNOSIS — I10 Essential (primary) hypertension: Secondary | ICD-10-CM | POA: Insufficient documentation

## 2023-08-14 DIAGNOSIS — Z87891 Personal history of nicotine dependence: Secondary | ICD-10-CM | POA: Insufficient documentation

## 2023-08-21 DIAGNOSIS — D485 Neoplasm of uncertain behavior of skin: Secondary | ICD-10-CM | POA: Diagnosis not present

## 2023-08-21 DIAGNOSIS — C44329 Squamous cell carcinoma of skin of other parts of face: Secondary | ICD-10-CM | POA: Diagnosis not present

## 2023-08-21 NOTE — Progress Notes (Unsigned)
Gynecologic Oncology Return Clinic Visit  08/22/23  Reason for Visit: follow-up, treatment planning  Treatment History: Oncology History  Adenocarcinoma of upper lobe of right lung (HCC)  08/06/2023 Initial Diagnosis   Adenocarcinoma of upper lobe of right lung (HCC)   08/06/2023 Cancer Staging   Staging form: Lung, AJCC 8th Edition - Clinical: Stage IB (cT2a, cN0, cM0) - Signed by Si Gaul, MD on 08/06/2023    The patient had a recent CT scan in the setting of her COPD and lung cancer screening.  CT of the chest in early June showed a new part solid masslike lesion in the posterior segment right upper lobe concerning for possible malignancy.  PET scan was performed on 7/15 and shows a mixed attenuation right upper lobe lung mass that is hypermetabolic, most consistent with primary bronchogenic carcinoma.  Incidental finding of a 12.4 cm partially solid, partially cystic mass in the anterior pelvis suspected to be arising from the ovary.  There is hypermetabolism cups corresponding to soft tissue fullness about the pancreatic head and descending duodenum, with differential diagnosis including pancreatic head carcinoma or focal pancreatitis.   The patient saw Dr. Delton Coombes on 7/31.  She is going to be scheduled for navigational bronchoscopy (scheduled on 8/13) to evaluate her right upper lobe mass.  Referral was sent to GI in the setting of her pancreatic hypermetabolism.   Her history is notable for stage IA granulosa cell tumor diagnosed in 2007.  At that time, she underwent ex lap, bilateral salpingo-oophorectomy, pelvic lymphadenectomy, and omentectomy with Dr. Stanford Breed.  Pathology revealed a 23 cm adult granulosa cell tumor of the left ovary with a benign fallopian tube.  Right tube and ovary were benign.  No involved pelvic lymph nodes.  Small bowel adhesions were submitted and negative for evidence of malignancy.  Inhibin levels at the time of her diagnosis were elevated to 77.  She  was last seen in 2012 in our clinic and remained NED.   Inhibin B on 8/9: 1,006.3   07/18/23: Robotic-assisted radical dissection for pelvic mass, lysis of adhesions for approximately 10 minutes, cystoscopy, mini-lap for specimen delivery    Interval History: Patient reports doing well.  Denies any significant abdominal or pelvic pain.  Presented for her radiation simulation today.  Denies any issues urinating since Foley catheter was removed.  Denies any vaginal bleeding.  Reports normal bowel function.  2 spots recently removed by her dermatologist on her face that are suspected to be basal cell carcinoma.  Past Medical/Surgical History: Past Medical History:  Diagnosis Date   Abnormal PFT 09/02/2014   moderate restrictive ventilatory defect.   Arthritis    hip   BCC (basal cell carcinoma), face 2010   Dr. Terri Piedra   Colon polyp 12/2003   Diverticulosis 11/2010   seen on colonoscopy and air contrast BE   Emphysema of lung (HCC)    Hyperlipidemia    Hypertension    previously treated with meds, resolved   MGUS (monoclonal gammopathy of unknown significance) 1999   prevously under care of Dr. 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   Tobacco abuse    quit in 2019    Past Surgical History:  Procedure Laterality Date  BASAL CELL CARCINOMA EXCISION  11/26/2008   L face, R temple   BILATERAL SALPINGOOPHORECTOMY  11/26/2005   cancerous tumor R ovary   BRONCHIAL BIOPSY  07/09/2023   Procedure: BRONCHIAL BIOPSIES;  Surgeon: Leslye Peer, MD;  Location: [facility] ENDOSCOPY;  Service: Pulmonary;;   BRONCHIAL BRUSHINGS  07/09/2023   Procedure: BRONCHIAL BRUSHINGS;  Surgeon: Leslye Peer, MD;  Location: [facility] ENDOSCOPY;  Service: Pulmonary;;   BRONCHIAL NEEDLE ASPIRATION BIOPSY  07/09/2023    Procedure: BRONCHIAL NEEDLE ASPIRATION BIOPSIES;  Surgeon: Leslye Peer, MD;  Location: [facility] ENDOSCOPY;  Service: Pulmonary;;   CATARACT EXTRACTION  11/27/2007   bilateral   COLONOSCOPY  11/26/2010   Dr. Laural Benes. Diverticulosis. BE also done--normal   FIDUCIAL MARKER PLACEMENT  07/09/2023   Procedure: FIDUCIAL MARKER PLACEMENT;  Surgeon: Leslye Peer, MD;  Location: [facility] ENDOSCOPY;  Service: Pulmonary;;   OMENTECTOMY  11/26/2005   and pelvic lymphadenectomy (with BSO)   ROBOTIC ASSISTED LAPAROSCOPIC LYSIS OF ADHESION N/A 07/18/2023   Procedure: ROBOTIC ASSISTED PELVIC MASS EXCISION WITH RADICAL DISSECTION, MINI LAPAROTOMY, CYSTOSCOPY;  Surgeon: Carver Fila, MD;  Location: [facility] ORS;  Service: Gynecology;  Laterality: N/A;   SKIN CANCER EXCISION Left 07/2021   neck; pt can't recall type of cancer   TONSILLECTOMY  age 74   VAGINAL HYSTERECTOMY  late 20's   "didn't want any more children"    Family History  Problem Relation Age of Onset   Stroke Mother    Cerebral aneurysm Mother    Hypertension Father    Suicidality Father    Diabetes Maternal Aunt    Diabetes Maternal Aunt    Breast cancer Maternal Aunt    Diabetes Maternal Aunt    Breast cancer Maternal Aunt        2 aunts   Throat cancer Maternal Uncle    Lung cancer Maternal Uncle    Prostate cancer Paternal Uncle    Colon cancer Neg Hx    Ovarian cancer Neg Hx    Endometrial cancer Neg Hx    Pancreatic cancer Neg Hx     Social History   Socioeconomic History   Marital status: Married    Spouse name: Not on file   Number of children: 1   Years of education: Not on file   Highest education level: Not on file  Occupational History   Occupation: retired Warehouse manager company)    Employer: RETIRED  Tobacco Use   Smoking status: Former    Current packs/day: 0.00    Average packs/day: 2.0 packs/day for 59.8 years (119.6 ttl pk-yrs)    Types: Cigarettes    Start date: 75    Quit date: 09/10/2018    Years  since quitting: 4.9    Passive exposure: Past (quit in 2019)   Smokeless tobacco: Never  Vaping Use   Vaping status: Some Days  Substance and Sexual Activity   Alcohol use: Yes    Comment: rarely   Drug use: No   Sexual activity: Not Currently    Partners: Male  Other Topics Concern   Not on file  Social History Narrative   Lives with husband, 1 cat.  Son lives with him during the week, stays with his girlfriend on the weekends (divorced).   1 grandson.  Expecting a great-grandbaby      Updated 04/2023   Social Determinants of Health   Financial Resource Strain: Not on file  Food Insecurity: No Food Insecurity (08/14/2023)   Hunger Vital Sign  Worried About Programme researcher, broadcasting/film/video in the Last Year: Never true    Ran Out of Food in the Last Year: Never true  Transportation Needs: No Transportation Needs (08/14/2023)   PRAPARE - Administrator, Civil Service (Medical): No    Lack of Transportation (Non-Medical): No  Physical Activity: Not on file  Stress: Not on file  Social Connections: Not on file    Current Medications:  Current Outpatient Medications:    albuterol (VENTOLIN HFA) 108 (90 Base) MCG/ACT inhaler, Inhale 2 puffs into the lungs every 6 (six) hours as needed for wheezing or shortness of breath., Disp: 8 g, Rfl: 1   cholecalciferol (VITAMIN D3) 25 MCG (1000 UNIT) tablet, Take 1,000 Units by mouth daily., Disp: , Rfl:    Coenzyme Q10 (COQ10) 100 MG CAPS, Take 100 mg by mouth daily., Disp: , Rfl:    losartan (COZAAR) 50 MG tablet, Take 1 tablet (50 mg total) by mouth daily., Disp: 90 tablet, Rfl: 3   Tiotropium Bromide-Olodaterol (STIOLTO RESPIMAT) 2.5-2.5 MCG/ACT AERS, Inhale 2 puffs into the lungs daily. (Patient taking differently: Inhale 2 puffs into the lungs every evening.), Disp: 4 g, Rfl: 11   letrozole (FEMARA) 2.5 MG tablet, Take 1 tablet (2.5 mg total) by mouth daily., Disp: 60 tablet, Rfl: 6   rosuvastatin (CRESTOR) 10 MG tablet, Take 1 tablet  (10 mg total) by mouth daily., Disp: 90 tablet, Rfl: 3  Review of Systems: Denies appetite changes, fevers, chills, fatigue, unexplained weight changes. Denies hearing loss, neck lumps or masses, mouth sores, ringing in ears or voice changes. Denies cough or wheezing.  Denies shortness of breath. Denies chest pain or palpitations. Denies leg swelling. Denies abdominal distention, pain, blood in stools, constipation, diarrhea, nausea, vomiting, or early satiety. Denies pain with intercourse, dysuria, frequency, hematuria or incontinence. Denies hot flashes, pelvic pain, vaginal bleeding or vaginal discharge.   Denies joint pain, back pain or muscle pain/cramps. Denies itching, rash, or wounds. Denies dizziness, headaches, numbness or seizures. Denies swollen lymph nodes or glands, denies easy bruising or bleeding. Denies anxiety, depression, confusion, or decreased concentration.  Physical Exam: BP 127/60 (BP Location: Left Arm, Patient Position: Sitting)   Pulse 66   Temp 97.9 F (36.6 C) (Oral)   Resp 18   Ht 5' 5.5" (1.664 m)   Wt 141 lb 9.6 oz (64.2 kg)   SpO2 98%   BMI 23.21 kg/m  General: Alert, oriented, no acute distress. HEENT: Normocephalic, atraumatic, sclera anicteric. Chest: Unlabored breathing on room air. Abdomen: soft, nontender.  Normoactive bowel sounds.  No masses or hepatosplenomegaly appreciated.  Well-healed visions.  Remaining Dermabond removed. Extremities: Grossly normal range of motion.  Warm, well perfused.  No edema bilaterally.  Laboratory & Radiologic Studies: A. PELVIC MASS, EXCISION: Granulosa cell tumor, adult type with tumor necrosis See oncology table and comment  ONCOLOGY TABLE: OVARY or FALLOPIAN TUBE or PRIMARY PERITONEUM: Resection Procedure: Pelvic mass excision Specimen Integrity: Received in fragments Tumor Site: 245 g, see comment Tumor Size: See comment Histologic Type: Adult type granulosa cell tumor Histologic Grade:  Well-differentiated Ovarian Surface Involvement: See comment Fallopian Tube Surface Involvement: Not applicable Implants: Not applicable Lymphatic and/or Vascular Invasion: Not identified Other Tissue/ Organ Involvement: Not applicable Largest Extrapelvic Peritoneal Focus: Not applicable Peritoneal/Ascitic Fluid Involvement: No malignant cells identified ([id number]) Chemotherapy Response Score (CRS): Not applicable Regional Lymph Nodes: No lymph nodes submitted Distant Metastasis:      Distant Site(s) Involved: Not applicable Pathologic Stage Classification (  pTNM, AJCC 8th Edition): pT see comment, pN not assigned Ancillary Studies: Can be performed if requested Representative Tumor Block: A1-A9 Comment(s): The tumor has histologic features consistent with adult type granulosa cell tumor with large areas of tumor necrosis.  The tumor is received in fragments which somewhat hampers evaluation for ovarian surface involvement; however, there is skeletal muscle and adipose tissue attached to the tumor and focally tumor is adjacent to skeletal muscle and adipose tissue with minimal intervening connective tissue. This finding suggests that the staging is most likely pT1c2.  Clinical and intraoperative surgical findings are necessary for accurate staging. Accurate determination of the greatest tumor dimension is also hampered by the fragmented nature of the specimen.    Cytology FINAL MICROSCOPIC DIAGNOSIS:  - No malignant cells identified    Assessment & Plan: Tiffany [name] is a 77 y.o. woman with recurrent granulosa cell tumor, recently diagnosed lung cancer, and pancreatic lesion.  Patient is doing very well.  Meeting all postoperative milestones.  Discussed final pathology again with her which shows recurrent granulosa cell tumor.  Given invasion into surrounding structures, although completely resected, I would strongly recommend adjuvant treatment.  We discussed this could be  chemotherapy or hormonal therapy.  Given need for radiation for her lung cancer and workup of her pancreatic lesion, she strongly desires to avoid chemotherapy.  She is amenable to starting an aromatase inhibitor.  In the setting of bone scan showing osteopenia last year, stressed the importance of continuing to take her calcium and vitamin D, which she is already doing.  I will plan to see her back for follow-up in 3 months.  We will use inhibin B as this was a tumor marker for her.   Discussed referral to genetics as well as performing genomic testing.  Patient amenable to referral being sent today.  She will need a repeat bone scan in 2025.  22 minutes of total time was spent for this patient encounter, including preparation, face-to-face counseling with the patient and coordination of care, and documentation of the encounter.  Eugene Garnet, MD  Division of Gynecologic Oncology  Department of Obstetrics and Gynecology  [facility]

## 2023-08-22 ENCOUNTER — Inpatient Hospital Stay (HOSPITAL_BASED_OUTPATIENT_CLINIC_OR_DEPARTMENT_OTHER): Payer: Medicare Other | Admitting: Gynecologic Oncology

## 2023-08-22 ENCOUNTER — Ambulatory Visit
Admission: RE | Admit: 2023-08-22 | Discharge: 2023-08-22 | Disposition: A | Discharge status: Home / Self Care | Payer: Medicare Other | Source: Ambulatory Visit | Attending: Radiation Oncology | Admitting: Radiation Oncology

## 2023-08-22 ENCOUNTER — Encounter: Payer: Self-pay | Admitting: Gynecologic Oncology

## 2023-08-22 VITALS — BP 127/60 | HR 66 | Temp 97.9°F | Resp 18 | Ht 65.5 in | Wt 141.6 lb

## 2023-08-22 DIAGNOSIS — Z7189 Other specified counseling: Secondary | ICD-10-CM

## 2023-08-22 DIAGNOSIS — C3411 Malignant neoplasm of upper lobe, right bronchus or lung: Secondary | ICD-10-CM

## 2023-08-22 DIAGNOSIS — Z87891 Personal history of nicotine dependence: Secondary | ICD-10-CM | POA: Diagnosis not present

## 2023-08-22 DIAGNOSIS — D3910 Neoplasm of uncertain behavior of unspecified ovary: Secondary | ICD-10-CM

## 2023-08-22 DIAGNOSIS — K8689 Other specified diseases of pancreas: Secondary | ICD-10-CM

## 2023-08-22 MED ORDER — LETROZOLE 2.5 MG PO TABS
2.5000 mg | ORAL_TABLET | Freq: Every day | ORAL | 6 refills | Status: DC
Start: 2023-08-22 — End: 2023-09-11

## 2023-08-22 MED ORDER — LETROZOLE 2.5 MG PO TABS
2.5000 mg | ORAL_TABLET | Freq: Every day | ORAL | 6 refills | Status: DC
Start: 2023-08-22 — End: 2023-08-22

## 2023-08-22 NOTE — Patient Instructions (Addendum)
It was good to see you today.  You are healing very well from surgery.  Please remember, no heavy lifting for 6 weeks after surgery.  I will see you back for follow-up in 3 months.  Please let me know if you have any issues with the letrozole.  Please make sure to take vitamin D and calcium as this medication can affect your bone health and your last bone scan in 2023 you are osteopenic.

## 2023-08-29 ENCOUNTER — Ambulatory Visit: Payer: Medicare Other | Admitting: Hematology and Oncology

## 2023-09-02 ENCOUNTER — Ambulatory Visit (HOSPITAL_COMMUNITY)
Admission: RE | Admit: 2023-09-02 | Discharge: 2023-09-02 | Disposition: A | Discharge status: Home / Self Care | Payer: Medicare Other | Source: Ambulatory Visit | Attending: Gynecologic Oncology | Admitting: Gynecologic Oncology

## 2023-09-02 ENCOUNTER — Encounter (HOSPITAL_COMMUNITY): Payer: Self-pay

## 2023-09-02 DIAGNOSIS — Q453 Other congenital malformations of pancreas and pancreatic duct: Secondary | ICD-10-CM

## 2023-09-02 MED ORDER — GADOBUTROL 1 MMOL/ML IV SOLN: 6.0000 mL | Freq: Once | INTRAVENOUS | Status: DC | PRN

## 2023-09-03 ENCOUNTER — Telehealth: Payer: Self-pay

## 2023-09-03 ENCOUNTER — Other Ambulatory Visit: Payer: Self-pay | Admitting: Gynecologic Oncology

## 2023-09-03 ENCOUNTER — Ambulatory Visit
Admission: RE | Admit: 2023-09-03 | Discharge: 2023-09-03 | Disposition: A | Discharge status: Home / Self Care | Payer: Medicare Other | Source: Ambulatory Visit | Attending: Radiation Oncology | Admitting: Radiation Oncology

## 2023-09-03 DIAGNOSIS — Q453 Other congenital malformations of pancreas and pancreatic duct: Secondary | ICD-10-CM

## 2023-09-03 DIAGNOSIS — Z87891 Personal history of nicotine dependence: Secondary | ICD-10-CM | POA: Diagnosis not present

## 2023-09-03 DIAGNOSIS — C3411 Malignant neoplasm of upper lobe, right bronchus or lung: Secondary | ICD-10-CM | POA: Diagnosis not present

## 2023-09-03 NOTE — Telephone Encounter (Signed)
Tiffany [name] called office stating she was scheduled for an MRI yesterday which was ordered by Dr.Tucker. She states she was unable to finish it d/t she got claustrophobic and with her lung cancer she couldn't breathe. She wants to know what next step would be?  Pt aware Dr. Pricilla Holm is out of the office, message sent, and will call pt back with advice.

## 2023-09-03 NOTE — Telephone Encounter (Signed)
Pt aware of CT pancreas abdomen being scheduled for 09/09/23 @ 5:00 with arrival time of 4:30p. Aware no solid foods 4 hours prior.   Pt agrees to date/time

## 2023-09-03 NOTE — Progress Notes (Signed)
See CMA note. Patient unable to complete MRI due to claustrophobia and difficulty breathing while in the scanner. Plan for alternate recommended test, CT pancreatic protocol, by GI with hopes for this to take place before upcoming consultation.

## 2023-09-04 ENCOUNTER — Other Ambulatory Visit: Payer: Self-pay

## 2023-09-04 DIAGNOSIS — C3411 Malignant neoplasm of upper lobe, right bronchus or lung: Secondary | ICD-10-CM

## 2023-09-04 LAB — RAD ONC ARIA SESSION SUMMARY
Course Elapsed Days: 0
Plan Fractions Treated to Date: 1
Plan Prescribed Dose Per Fraction: 18 Gy
Plan Total Fractions Prescribed: 3
Plan Total Prescribed Dose: 54 Gy
Reference Point Dosage Given to Date: 18 Gy
Reference Point Session Dosage Given: 18 Gy
Session Number: 1

## 2023-09-05 ENCOUNTER — Ambulatory Visit: Payer: Medicare Other | Admitting: Radiation Oncology

## 2023-09-06 ENCOUNTER — Ambulatory Visit
Admission: RE | Admit: 2023-09-06 | Discharge: 2023-09-06 | Disposition: A | Discharge status: Home / Self Care | Payer: Medicare Other | Source: Ambulatory Visit | Attending: Radiation Oncology | Admitting: Radiation Oncology

## 2023-09-06 ENCOUNTER — Other Ambulatory Visit: Payer: Self-pay

## 2023-09-06 DIAGNOSIS — C3411 Malignant neoplasm of upper lobe, right bronchus or lung: Secondary | ICD-10-CM | POA: Diagnosis not present

## 2023-09-06 LAB — RAD ONC ARIA SESSION SUMMARY
Course Elapsed Days: 2
Plan Fractions Treated to Date: 2
Plan Prescribed Dose Per Fraction: 18 Gy
Plan Total Fractions Prescribed: 3
Plan Total Prescribed Dose: 54 Gy
Reference Point Dosage Given to Date: 36 Gy
Reference Point Session Dosage Given: 18 Gy
Session Number: 2

## 2023-09-09 ENCOUNTER — Ambulatory Visit
Admission: RE | Admit: 2023-09-09 | Discharge: 2023-09-09 | Disposition: A | Discharge status: Home / Self Care | Payer: Medicare Other | Source: Ambulatory Visit | Attending: Radiation Oncology | Admitting: Radiation Oncology

## 2023-09-09 ENCOUNTER — Other Ambulatory Visit: Payer: Self-pay

## 2023-09-09 ENCOUNTER — Telehealth (HOSPITAL_BASED_OUTPATIENT_CLINIC_OR_DEPARTMENT_OTHER): Payer: Self-pay | Admitting: Pulmonary Disease

## 2023-09-09 ENCOUNTER — Ambulatory Visit (HOSPITAL_COMMUNITY)
Admission: RE | Admit: 2023-09-09 | Discharge: 2023-09-09 | Disposition: A | Discharge status: Home / Self Care | Payer: Medicare Other | Source: Ambulatory Visit | Attending: Gynecologic Oncology | Admitting: Gynecologic Oncology

## 2023-09-09 DIAGNOSIS — C3411 Malignant neoplasm of upper lobe, right bronchus or lung: Secondary | ICD-10-CM

## 2023-09-09 DIAGNOSIS — K8689 Other specified diseases of pancreas: Secondary | ICD-10-CM | POA: Diagnosis not present

## 2023-09-09 DIAGNOSIS — Z87891 Personal history of nicotine dependence: Secondary | ICD-10-CM | POA: Diagnosis not present

## 2023-09-09 DIAGNOSIS — K8020 Calculus of gallbladder without cholecystitis without obstruction: Secondary | ICD-10-CM | POA: Diagnosis not present

## 2023-09-09 DIAGNOSIS — Q453 Other congenital malformations of pancreas and pancreatic duct: Secondary | ICD-10-CM | POA: Insufficient documentation

## 2023-09-09 DIAGNOSIS — Z510 Encounter for antineoplastic radiation therapy: Secondary | ICD-10-CM | POA: Diagnosis not present

## 2023-09-09 LAB — RAD ONC ARIA SESSION SUMMARY
Course Elapsed Days: 5
Plan Fractions Treated to Date: 3
Plan Prescribed Dose Per Fraction: 18 Gy
Plan Total Fractions Prescribed: 3
Plan Total Prescribed Dose: 54 Gy
Reference Point Dosage Given to Date: 54 Gy
Reference Point Session Dosage Given: 18 Gy
Session Number: 3

## 2023-09-09 MED ORDER — IOHEXOL 9 MG/ML PO SOLN: 1000.0000 mL | Freq: Once | ORAL | Status: DC

## 2023-09-09 MED ORDER — IOHEXOL 300 MG/ML  SOLN
100.0000 mL | Freq: Once | INTRAMUSCULAR | Status: AC | PRN
  Administered 2023-09-09: 100 mL via INTRAVENOUS

## 2023-09-09 MED ORDER — IOHEXOL 9 MG/ML PO SOLN
ORAL | Status: AC
  Filled 2023-09-09: qty 500

## 2023-09-09 MED ORDER — IOHEXOL 9 MG/ML PO SOLN
500.0000 mL | Freq: Once | ORAL | Status: AC
  Administered 2023-09-09: 500 mL via ORAL

## 2023-09-09 MED ORDER — ALBUTEROL SULFATE HFA 108 (90 BASE) MCG/ACT IN AERS: 2.0000 | INHALATION_SPRAY | Freq: Four times a day (QID) | RESPIRATORY_TRACT | 1 refills | Status: DC | PRN

## 2023-09-09 NOTE — Telephone Encounter (Signed)
-  Albuterol refilled.

## 2023-09-10 ENCOUNTER — Inpatient Hospital Stay: Payer: Medicare Other

## 2023-09-10 ENCOUNTER — Ambulatory Visit: Payer: Medicare Other | Admitting: Radiation Oncology

## 2023-09-10 ENCOUNTER — Encounter: Payer: Self-pay | Admitting: Genetic Counselor

## 2023-09-10 ENCOUNTER — Inpatient Hospital Stay: Payer: Medicare Other | Attending: Gynecologic Oncology | Admitting: Genetic Counselor

## 2023-09-10 ENCOUNTER — Other Ambulatory Visit: Payer: Self-pay | Admitting: Genetic Counselor

## 2023-09-10 DIAGNOSIS — R1900 Intra-abdominal and pelvic swelling, mass and lump, unspecified site: Secondary | ICD-10-CM

## 2023-09-10 DIAGNOSIS — Z8041 Family history of malignant neoplasm of ovary: Secondary | ICD-10-CM | POA: Diagnosis not present

## 2023-09-10 DIAGNOSIS — D3910 Neoplasm of uncertain behavior of unspecified ovary: Secondary | ICD-10-CM

## 2023-09-10 DIAGNOSIS — Z803 Family history of malignant neoplasm of breast: Secondary | ICD-10-CM | POA: Diagnosis not present

## 2023-09-10 DIAGNOSIS — Z8543 Personal history of malignant neoplasm of ovary: Secondary | ICD-10-CM | POA: Diagnosis not present

## 2023-09-10 LAB — GENETIC SCREENING ORDER

## 2023-09-10 NOTE — Progress Notes (Addendum)
REFERRING PROVIDER: Carver Fila, MD [address] [place],  [place] [address]  PRIMARY PROVIDER:  Joselyn Arrow, MD  PRIMARY REASON FOR VISIT:  1. Family history of breast cancer   2. Granulosa cell tumor      HISTORY OF PRESENT ILLNESS:   Tiffany [name], a 77 y.o. female, was seen for a [place] cancer genetics consultation at the request of Dr. Pricilla Holm due to a personal and family history of cancer.  Tiffany [name] presents to clinic today to discuss the possibility of a hereditary predisposition to cancer, genetic testing, and to further clarify her future cancer risks, as well as potential cancer risks for family members.   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 The treatment plan included TAH-BSO.  In 2024, at the age of 75, Tiffany [name] was diagnosed with a recurrent granulosa cell tumor, lung cancer and a pancreatic abnormality that is currently being worked up.  Marland Kitchen    CANCER HISTORY:  Oncology History  Adenocarcinoma of upper lobe of right lung (HCC)  08/06/2023 Initial Diagnosis   Adenocarcinoma of upper lobe of right lung (HCC)   08/06/2023 Cancer Staging   Staging form: Lung, AJCC 8th Edition - Clinical: Stage IB (cT2a, cN0, cM0) - Signed by Si Gaul, MD on 08/06/2023      RISK FACTORS:  Menarche was at age 16-10.  First live birth at age 82.  OCP use for approximately  5-10  years.  Ovaries intact: no.  Hysterectomy: yes.  Menopausal status: postmenopausal.  HRT use: 0 years. Colonoscopy: yes; normal. Mammogram within the last year: yes. Number of breast biopsies: 0. Up to date with pelvic exams: n/a. Any excessive radiation exposure in the past: no  Past Medical History:  Diagnosis Date   Abnormal PFT 09/02/2014   moderate restrictive ventilatory defect.   Arthritis    hip   BCC (basal cell carcinoma), face 2010   Dr. Terri Piedra   Colon polyp 12/2003   Diverticulosis 11/2010   seen on colonoscopy and air  contrast BE   Emphysema of lung (HCC)    Family history of breast cancer    Hyperlipidemia    Hypertension    previously treated with meds, resolved   MGUS (monoclonal gammopathy of unknown significance) 1999   prevously under care of Dr. 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   Tobacco abuse    quit in 2019    Past Surgical History:  Procedure Laterality Date   BASAL CELL CARCINOMA EXCISION  11/26/2008   L face, R temple   BILATERAL SALPINGOOPHORECTOMY  11/26/2005   cancerous tumor R ovary   BRONCHIAL BIOPSY  07/09/2023   Procedure: BRONCHIAL BIOPSIES;  Surgeon: Leslye Peer, MD;  Location: [facility] ENDOSCOPY;  Service: Pulmonary;;   BRONCHIAL BRUSHINGS  07/09/2023   Procedure: BRONCHIAL BRUSHINGS;  Surgeon: Leslye Peer, MD;  Location: [facility] ENDOSCOPY;  Service: Pulmonary;;   BRONCHIAL NEEDLE ASPIRATION BIOPSY  07/09/2023   Procedure: BRONCHIAL NEEDLE ASPIRATION BIOPSIES;  Surgeon: Leslye Peer, MD;  Location: [facility] ENDOSCOPY;  Service: Pulmonary;;   CATARACT EXTRACTION  11/27/2007   bilateral   COLONOSCOPY  11/26/2010  Dr. Laural Benes. Diverticulosis. BE also done--normal   FIDUCIAL MARKER PLACEMENT  07/09/2023   Procedure: FIDUCIAL MARKER PLACEMENT;  Surgeon: Leslye Peer, MD;  Location: [facility] ENDOSCOPY;  Service: Pulmonary;;   OMENTECTOMY  11/26/2005   and pelvic lymphadenectomy (with BSO)   ROBOTIC ASSISTED LAPAROSCOPIC LYSIS OF ADHESION N/A 07/18/2023   Procedure: ROBOTIC ASSISTED PELVIC MASS EXCISION WITH RADICAL DISSECTION, MINI LAPAROTOMY, CYSTOSCOPY;  Surgeon: Carver Fila, MD;  Location: [facility] ORS;  Service: Gynecology;  Laterality: N/A;   SKIN CANCER EXCISION Left 07/2021   neck; pt can't recall type of cancer   TONSILLECTOMY  age 58   VAGINAL HYSTERECTOMY  late 20's   "didn't  want any more children"    Social History   Socioeconomic History   Marital status: Married    Spouse name: Not on file   Number of children: 1   Years of education: Not on file   Highest education level: Not on file  Occupational History   Occupation: retired Warehouse manager company)    Employer: RETIRED  Tobacco Use   Smoking status: Former    Current packs/day: 0.00    Average packs/day: 2.0 packs/day for 59.8 years (119.6 ttl pk-yrs)    Types: Cigarettes    Start date: 41    Quit date: 09/10/2018    Years since quitting: 5.0    Passive exposure: Past (quit in 2019)   Smokeless tobacco: Never  Vaping Use   Vaping status: Some Days  Substance and Sexual Activity   Alcohol use: Yes    Comment: rarely   Drug use: No   Sexual activity: Not Currently    Partners: Male  Other Topics Concern   Not on file  Social History Narrative   Lives with husband, 1 cat.  Son lives with him during the week, stays with his girlfriend on the weekends (divorced).   1 grandson.  Expecting a great-grandbaby      Updated 04/2023   Social Determinants of Health   Financial Resource Strain: Not on file  Food Insecurity: No Food Insecurity (08/14/2023)   Hunger Vital Sign    Worried About Running Out of Food in the Last Year: Never true    Ran Out of Food in the Last Year: Never true  Transportation Needs: No Transportation Needs (08/14/2023)   PRAPARE - Administrator, Civil Service (Medical): No    Lack of Transportation (Non-Medical): No  Physical Activity: Not on file  Stress: Not on file  Social Connections: Not on file     FAMILY HISTORY:  We obtained a detailed, 4-generation family history.  Significant diagnoses are listed below: Family History  Problem Relation Age of Onset   Stroke Mother    Cerebral aneurysm Mother    Hypertension Father    Suicidality Father    Diabetes Maternal Aunt    Diabetes Maternal Aunt    Breast cancer Maternal Aunt    Diabetes  Maternal Aunt    Breast cancer Maternal Aunt    Throat cancer Maternal Uncle    Lung cancer Maternal Uncle    Colon cancer Neg Hx    Ovarian cancer Neg Hx    Endometrial cancer Neg Hx    Pancreatic cancer Neg Hx      The patient has one son and one grandson who are cancer free.  She is an only child.  Her parents are deceased.  The patient's father had one brother who is cancer free.  There is  no other cancer diagnosis on this side of the family.   The patient's mother has four sister sand two brothers.  Both sisters had breast cancer.  There is no other cancer on this side of the family.  Tiffany [name] is unaware of previous family history of genetic testing for hereditary cancer risks.  There is no reported Ashkenazi Jewish ancestry. There is no known consanguinity.  GENETIC COUNSELING ASSESSMENT: Tiffany [name] is a 77 y.o. female with a personal and family history of cancer which is somewhat suggestive of a hereditary cancer syndrome and predisposition to cancer given the combination of cancer. We, therefore, discussed and recommended the following at today's visit.   DISCUSSION: We discussed that, in general, most cancer is not inherited in families, but instead is sporadic or familial. Sporadic cancers occur by chance and typically happen at older ages (>50 years) as this type of cancer is caused by genetic changes acquired during an individual's lifetime. Some families have more cancers than would be expected by chance; however, the ages or types of cancer are not consistent with a known genetic mutation or known genetic mutations have been ruled out. This type of familial cancer is thought to be due to a combination of multiple genetic, environmental, hormonal, and lifestyle factors. While this combination of factors likely increases the risk of cancer, the exact source of this risk is not currently identifiable or testable.  We discussed that up to 20% of ovarian cancer is hereditary, with most  cases associated with BRCA mutations.  There are other genes that can be associated with hereditary ovarian cancer syndromes.  These include BRIP1, RAD51C, RAD51D and Lynch syndrome.  We discussed that testing is beneficial for several reasons including knowing how to follow individuals after completing their treatment, identifying whether potential treatment options such as PARP inhibitors would be beneficial, and understand if other family members could be at risk for cancer and allow them to undergo genetic testing.   We reviewed the characteristics, features and inheritance patterns of hereditary cancer syndromes. We also discussed genetic testing, including the appropriate family members to test, the process of testing, insurance coverage and turn-around-time for results. We discussed the implications of a negative, positive, carrier and/or variant of uncertain significant result. Tiffany [name]  was offered a common hereditary cancer panel (40+ genes) and an expanded pan-cancer panel (70+ genes). Tiffany [name] was informed of the benefits and limitations of each panel, including that expanded pan-cancer panels contain genes that do not have clear management guidelines at this point in time.  We also discussed that as the number of genes included on a panel increases, the chances of variants of uncertain significance increases. Tiffany [name] decided to pursue genetic testing for the CancerNext-Expanded+RNAinsight gene panel.   The CancerNext-Expanded gene panel offered by [employer] and includes sequencing and rearrangement analysis for the following 71 genes: AIP, ALK, APC, ATM, BAP1, BARD1, BMPR1A, BRCA1, BRCA2, BRIP1, CDC73, CDH1, CDK4, CDKN1B, CDKN2A, CHEK2, DICER1, FH, FLCN, KIF1B, LZTR1, MAX, MEN1, MET, MLH1, MSH2, MSH6, MUTYH, NF1, NF2, NTHL1, PALB2, PHOX2B, PMS2, POT1, PRKAR1A, PTCH1, PTEN, RAD51C, RAD51D, RB1, RET, SDHA, SDHAF2, SDHB, SDHC, SDHD, SMAD4, SMARCA4, SMARCB1, SMARCE1, STK11, SUFU, TMEM127, TP53,  TSC1, TSC2 and VHL (sequencing and deletion/duplication); AXIN2, CTNNA1, EGFR, EGLN1, HOXB13, KIT, MITF, MSH3, PDGFRA, POLD1 and POLE (sequencing only); EPCAM and GREM1 (deletion/duplication only). RNA data is routinely analyzed for use in variant interpretation for all genes.   Based on Tiffany [name]'s personal and family history of cancer,  she meets medical criteria for genetic testing. Despite that she meets criteria, she may still have an out of pocket cost. We discussed that if her out of pocket cost for testing is over $100, the laboratory will call and confirm whether she wants to proceed with testing.  If the out of pocket cost of testing is less than $100 she will be billed by the genetic testing laboratory.   We discussed that some people do not want to undergo genetic testing due to fear of genetic discrimination.  The Genetic Information Nondiscrimination Act (GINA) was signed into federal law in 2008. GINA prohibits health insurers and most employers from discriminating against individuals based on genetic information (including the results of genetic tests and family history information). According to GINA, health insurance companies cannot consider genetic information to be a preexisting condition, nor can they use it to make decisions regarding coverage or rates. GINA also makes it illegal for most employers to use genetic information in making decisions about hiring, firing, promotion, or terms of employment. It is important to note that GINA does not offer protections for life insurance, disability insurance, or long-term care insurance. GINA does not apply to those in the [employer], those who work for companies with less than 15 employees, and new life insurance or long-term disability insurance policies.  Health status due to a cancer diagnosis is not protected under GINA. More information about GINA can be found by visiting [email or web address].    PLAN: After considering the risks, benefits, and  limitations, Tiffany [name] provided informed consent to pursue genetic testing and the blood sample was sent to [facility] for analysis of the CancerNext-Expanded+RNAinsight. Results should be available within approximately 2-3 weeks' time, at which point they will be disclosed by telephone to Tiffany [name], as will any additional recommendations warranted by these results. Tiffany [name] will receive a summary of her genetic counseling visit and a copy of her results once available. This information will also be available in Epic.   Lastly, we encouraged Tiffany [name] to remain in contact with cancer genetics annually so that we can continuously update the family history and inform her of any changes in cancer genetics and testing that may be of benefit for this family.   Tiffany [name] questions were answered to her satisfaction today. Our contact information was provided should additional questions or concerns arise. Thank you for the referral and allowing [place] to share in the care of your patient.   Bryony Kaman P. Lowell Guitar, MS, [facility] Licensed, Patent attorney [email or web address] phone: [phone]  The patient was seen for a total of 40 minutes in face-to-face genetic counseling.  The patient was seen alone.  Drs. Meliton Rattan, and/or [place] were available for questions, if needed..    _______________________________________________________________________ For Office Staff:  Number of people involved in session: 1 Was an Intern/ student involved with case: no

## 2023-09-10 NOTE — Radiation Completion Notes (Signed)
Patient Name: Tiffany [name], PAWELSKI MRN: [id number] Date of Birth: Jul 19, 1946 Referring Physician: Si Gaul, M.D. Date of Service: 2023-09-10 Radiation Oncologist: Arnette Schaumann, M.D. [facility]                             RADIATION ONCOLOGY END OF TREATMENT NOTE     Diagnosis: C34.11 Malignant neoplasm of upper lobe, right bronchus or lung Staging on 2023-08-06: Adenocarcinoma of upper lobe of right lung (HCC) T=cT2a, N=cN0, M=cM0 Intent: Curative     ==========DELIVERED PLANS==========  First Treatment Date: 2023-09-04 - Last Treatment Date: 2023-09-09   Plan Name: Lung_R_SBRT Site: Lung, Right Technique: SBRT/SRT-IMRT Mode: Photon Dose Per Fraction: 18 Gy Prescribed Dose (Delivered / Prescribed): 54 Gy / 54 Gy Prescribed Fxs (Delivered / Prescribed): 3 / 3     ==========ON TREATMENT VISIT DATES========== 2023-09-04, 2023-09-06, 2023-09-09, 2023-09-09     ==========UPCOMING VISITS==========       ==========APPENDIX - ON TREATMENT VISIT NOTES==========   See weekly On Treatment Notes in Epic for details.

## 2023-09-11 ENCOUNTER — Ambulatory Visit: Payer: Medicare Other | Admitting: Radiation Oncology

## 2023-09-11 ENCOUNTER — Ambulatory Visit: Payer: Medicare Other | Admitting: Family Medicine

## 2023-09-11 ENCOUNTER — Encounter: Payer: Self-pay | Admitting: Family Medicine

## 2023-09-11 VITALS — BP 134/70 | HR 80 | Temp 97.9°F | Ht 65.5 in | Wt 142.2 lb

## 2023-09-11 DIAGNOSIS — R309 Painful micturition, unspecified: Secondary | ICD-10-CM | POA: Diagnosis not present

## 2023-09-11 DIAGNOSIS — N3000 Acute cystitis without hematuria: Secondary | ICD-10-CM

## 2023-09-11 DIAGNOSIS — Z23 Encounter for immunization: Secondary | ICD-10-CM

## 2023-09-11 LAB — POCT URINALYSIS DIP (PROADVANTAGE DEVICE)
Bilirubin, UA: NEGATIVE
Blood, UA: NEGATIVE
Glucose, UA: NEGATIVE mg/dL
Ketones, POC UA: NEGATIVE mg/dL
Nitrite, UA: NEGATIVE
Protein Ur, POC: NEGATIVE mg/dL
Specific Gravity, Urine: 1.01
Urobilinogen, Ur: 0.2
pH, UA: 7 (ref 5.0–8.0)

## 2023-09-11 MED ORDER — SULFAMETHOXAZOLE-TRIMETHOPRIM 800-160 MG PO TABS
1.0000 | ORAL_TABLET | Freq: Two times a day (BID) | ORAL | 0 refills | Status: DC
Start: 2023-09-11 — End: 2023-09-24

## 2023-09-11 NOTE — Progress Notes (Signed)
Chief Complaint  Patient presents with   Dysuria    When she stops urinating feels pain. Sometimes she feels like she has to go and then she "dribbles." She also notices and odor. Symptoms started last week.    Some time last week she started with urinary symptoms--she would get discomfort while voiding, and some urgency and frequency, with an odor.  She has been trying to drink cranberry juice, hasn't noticed improvement. Symptoms unchanged from last week. No hematuria. No abdominal pain or flank pain. Denies vaginal discharge or bleeding.  Last UTI was 2 months ago. Culture from August showed e.coli, pan-sensitive. She was treated with macrobid with good results.  PMH, PSH, SH reviewed  Getting radiation for lung cancer, got 3 treatments, states she completed it. She had CT abdomen/pancreas yesterday  Outpatient Encounter Medications as of 09/11/2023  Medication Sig Note   albuterol (VENTOLIN HFA) 108 (90 Base) MCG/ACT inhaler Inhale 2 puffs into the lungs every 6 (six) hours as needed for wheezing or shortness of breath. 09/11/2023: Used this am   cholecalciferol (VITAMIN D3) 25 MCG (1000 UNIT) tablet Take 1,000 Units by mouth daily.    Coenzyme Q10 (COQ10) 100 MG CAPS Take 100 mg by mouth daily.    losartan (COZAAR) 50 MG tablet Take 1 tablet (50 mg total) by mouth daily.    rosuvastatin (CRESTOR) 10 MG tablet Take 1 tablet (10 mg total) by mouth daily.    Tiotropium Bromide-Olodaterol (STIOLTO RESPIMAT) 2.5-2.5 MCG/ACT AERS Inhale 2 puffs into the lungs daily. (Patient taking differently: Inhale 2 puffs into the lungs every evening.)    [DISCONTINUED] letrozole (FEMARA) 2.5 MG tablet Take 1 tablet (2.5 mg total) by mouth daily. (Patient not taking: Reported on 09/11/2023)    No facility-administered encounter medications on file as of 09/11/2023.   No Known Allergies   ROS: No f/c, n/v, flank or abdominal pain. Urinary symptoms per HPI Back pain improved with PT, still  occasionial hurts.  Has home exercises, but hasn't done them regularly due to her surgery. Breathing at baseline, occ cough, unchanged.  No significant shortness of breath. +bruising (from trying to get IV for CT yesterday).     PHYSICAL EXAM:  BP 134/70   Pulse 80   Temp 97.9 F (36.6 C) (Tympanic)   Ht 5' 5.5" (1.664 m)   Wt 142 lb 3.2 oz (64.5 kg)   BMI 23.30 kg/m   Wt Readings from Last 3 Encounters:  09/11/23 142 lb 3.2 oz (64.5 kg)  08/22/23 141 lb 9.6 oz (64.2 kg)  08/14/23 139 lb 6.4 oz (63.2 kg)    Well-appearing, pleasant female, in good spirits HEENT: conjunctiva and sclera are clear, EOMI Heart: regular rate and rhythm Lungs: fairly clear, some decreased air movement, unchanged Back: no CVA tenderness Abdomen: soft, nontender Extremities: no edema Skin: large bruising and purpura L>R forearms. Psych: normal mood, affect, hygiene and grooming Neuro: alert and oriented. Ambulates with cane, normal strength Some discomfort in her back as she laid down on the table.  Urine: cloudy, SG 1.010, large leuks, otherwise negative   ASSESSMENT/PLAN:  Acute cystitis without hematuria - Plan: Urine Culture  Urinary pain - Plan: POCT Urinalysis DIP (Proadvantage Device), sulfamethoxazole-trimethoprim (BACTRIM DS) 800-160 MG tablet  Need for influenza vaccination - Plan: Flu Vaccine Trivalent High Dose (Fluad)

## 2023-09-11 NOTE — Patient Instructions (Signed)
Take the antibiotic twice daily for a week. Contact us if you have persistent or worsening symptoms after 48-72 hours. We will let you know if the antibiotic needs to be changed based on the culture results. You need to let us know if you aren't getting better, or if you aren't tolerating the antibiotic (side effect).  Stay well hydrated. At this point, you can drink water, doesn't need to be cranberry juice. Cranberry juice may be helpful only when symptoms first start, to see if that can prevent it from becoming a full blown infection (which only occasionally it can).

## 2023-09-13 ENCOUNTER — Ambulatory Visit: Payer: Medicare Other | Admitting: Radiation Oncology

## 2023-09-15 LAB — URINE CULTURE

## 2023-09-19 ENCOUNTER — Telehealth: Payer: Self-pay | Admitting: Gynecologic Oncology

## 2023-09-19 NOTE — Telephone Encounter (Signed)
Called patient to discuss CT scan results. She states she has been doing overall well. She was recently treated for a UTI and her symptoms have improved. She does report having a UTI in July as well.   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 No questions voiced. She has a GI appt on October 29 and advised to keep this appointment. Advised we would reach out to her oncologist, Dr. Gwenyth Bouillon, to make him aware. She is informed to please call the office for any needs.

## 2023-09-20 ENCOUNTER — Ambulatory Visit: Payer: Medicare Other | Admitting: Nurse Practitioner

## 2023-09-23 ENCOUNTER — Other Ambulatory Visit: Payer: Self-pay | Admitting: Pulmonary Disease

## 2023-09-24 ENCOUNTER — Other Ambulatory Visit (INDEPENDENT_AMBULATORY_CARE_PROVIDER_SITE_OTHER): Payer: Medicare Other

## 2023-09-24 ENCOUNTER — Other Ambulatory Visit: Payer: Self-pay

## 2023-09-24 ENCOUNTER — Ambulatory Visit: Payer: Medicare Other | Admitting: Gastroenterology

## 2023-09-24 ENCOUNTER — Telehealth: Payer: Self-pay

## 2023-09-24 ENCOUNTER — Encounter: Payer: Self-pay | Admitting: Gastroenterology

## 2023-09-24 VITALS — BP 122/68 | HR 87 | Ht 65.0 in | Wt 139.8 lb

## 2023-09-24 DIAGNOSIS — K8689 Other specified diseases of pancreas: Secondary | ICD-10-CM | POA: Diagnosis not present

## 2023-09-24 LAB — COMPREHENSIVE METABOLIC PANEL
ALT: 15 U/L (ref 0–35)
AST: 18 U/L (ref 0–37)
Albumin: 4.4 g/dL (ref 3.5–5.2)
Alkaline Phosphatase: 95 U/L (ref 39–117)
BUN: 14 mg/dL (ref 6–23)
CO2: 30 meq/L (ref 19–32)
Calcium: 10.1 mg/dL (ref 8.4–10.5)
Chloride: 99 meq/L (ref 96–112)
Creatinine, Ser: 0.79 mg/dL (ref 0.40–1.20)
GFR: 72.83 mL/min (ref >60.00)
Glucose, Bld: 113 mg/dL — ABNORMAL HIGH (ref 70–99)
Potassium: 4.3 meq/L (ref 3.5–5.1)
Sodium: 139 meq/L (ref 135–145)
Total Bilirubin: 0.4 mg/dL (ref 0.2–1.2)
Total Protein: 7.7 g/dL (ref 6.0–8.3)

## 2023-09-24 NOTE — Progress Notes (Signed)
HPI : Tiffany [name] is a 77 y.o. 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   The patient had a recent CT scan in the setting of her COPD and lung cancer screening. CT of the chest in early June showed a new part solid masslike lesion in the posterior segment right upper lobe concerning for possible malignancy. PET scan was performed on 7/15 and shows a mixed attenuation lesion in the right upper lobe lung mass that is hypermetabolic, most consistent with primary bronchogenic carcinoma. Incidental finding of a 12.4 cm partially solid, partially cystic mass in the anterior pelvis suspected to be arising from the ovary. There was also hypermetabolism corresponding to soft tissue fullness about the pancreatic head and descending duodenum, with differential diagnosis including pancreatic head carcinoma or focal pancreatitis.   Following the PET scan, she underwent a bronchoscopy by Dr. Delton Coombes in August, which confirmed suspicion of adenocarcinoma of the lung.  She then underwent surgical resection of the pelvic mass by Dr. Pricilla Holm which consisted of a robotic-assisted radical dissection for pelvic mass, lysis of adhesions for approximately 10 minutes, cystoscopy, mini-lap for specimen delivery on 07/18/2023.  She recovered from her surgery well without complications.  She followed up with Dr. Shirline Frees regarding her lung cancer and was started on radiation therapy, which she has now completed 3 treatments.  She then got the dedicated CT scan of the pancreas on October 14 which showed a suspicious appearing mass in the pancreatic head, 3.2X 2.5 cm with main duct dilation up to 10 mm, abruptly terminating in the pancreatic head.  The tumor abuts the SMV but there is no evidence of invasion.  There is no evidence of metastatic  disease.  Currently, the patient reports feeling fairly well considering all she has been through.  She has lost 20 pounds over the course of the summer.  However, she denies any abdominal pain and reports good appetite.  She denies any chronic GI symptoms at the moment.  She has a history of chronic tobacco use and COPD and has been recommended to use supplemental oxygen at night.  She denies significant dyspnea which limits her daily activities.  She uses her albuterol a few times a day.  She has tolerated her surgeries and procedures well thus far this year.  She was referred for genetic testing given her family history of malignancy and her personal history of multiple malignancies.  Test results still pending.   Past Medical History:  Diagnosis Date   Abnormal PFT 09/02/2014   moderate restrictive ventilatory defect.   Arthritis    hip   BCC (basal cell carcinoma), face 2010   Dr. Terri Piedra   Colon polyp 12/2003   Diverticulosis 11/2010   seen on colonoscopy and air contrast BE   Emphysema of lung (HCC)    Family history of breast cancer    Hyperlipidemia    Hypertension    previously treated with meds, resolved   MGUS (monoclonal gammopathy of unknown significance) 1999   prevously under care of Dr. 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   Tobacco abuse    quit in 2019     Past Surgical History:  Procedure Laterality Date   BASAL CELL CARCINOMA EXCISION  11/26/2008   L face, R temple   BILATERAL SALPINGOOPHORECTOMY  11/26/2005   cancerous tumor R ovary   BRONCHIAL BIOPSY  07/09/2023   Procedure: BRONCHIAL BIOPSIES;  Surgeon: Leslye Peer, MD;  Location: [facility] ENDOSCOPY;  Service: Pulmonary;;   BRONCHIAL BRUSHINGS  07/09/2023   Procedure: BRONCHIAL BRUSHINGS;  Surgeon: Leslye Peer, MD;  Location: [facility] ENDOSCOPY;  Service: Pulmonary;;   BRONCHIAL NEEDLE ASPIRATION BIOPSY  07/09/2023   Procedure: BRONCHIAL NEEDLE ASPIRATION BIOPSIES;  Surgeon: Leslye Peer, MD;  Location: [facility] ENDOSCOPY;  Service: Pulmonary;;   CATARACT EXTRACTION  11/27/2007   bilateral   COLONOSCOPY  11/26/2010   Dr. Laural Benes. Diverticulosis. BE also done--normal   FIDUCIAL MARKER PLACEMENT  07/09/2023   Procedure: FIDUCIAL MARKER PLACEMENT;  Surgeon: Leslye Peer, MD;  Location: [facility] ENDOSCOPY;  Service: Pulmonary;;   OMENTECTOMY  11/26/2005   and pelvic lymphadenectomy (with BSO)   ROBOTIC ASSISTED LAPAROSCOPIC LYSIS OF ADHESION N/A 07/18/2023   Procedure: ROBOTIC ASSISTED PELVIC MASS EXCISION WITH RADICAL DISSECTION, MINI LAPAROTOMY, CYSTOSCOPY;  Surgeon: Carver Fila, MD;  Location: [facility] ORS;  Service: Gynecology;  Laterality: N/A;   SKIN CANCER EXCISION Left 07/2021   neck; pt can't recall type of cancer   TONSILLECTOMY  age 48   VAGINAL HYSTERECTOMY  late 20's   "didn't want any more children"   Family History  Problem Relation Age of Onset   Stroke Mother    Cerebral aneurysm Mother    Hypertension Father    Suicidality Father    Diabetes Maternal Aunt    Diabetes Maternal Aunt    Breast cancer Maternal Aunt    Diabetes Maternal Aunt    Breast cancer Maternal Aunt    Throat cancer Maternal Uncle    Lung cancer Maternal Uncle    Colon cancer Neg Hx    Ovarian cancer Neg Hx    Endometrial cancer Neg Hx    Pancreatic cancer Neg Hx    Social History   Tobacco Use   Smoking status: Former    Current packs/day: 0.00    Average packs/day: 2.0 packs/day for 59.8 years (119.6 ttl pk-yrs)    Types: Cigarettes    Start date: 30    Quit date: 09/10/2018    Years since quitting: 5.0    Passive exposure: Past (quit in 2019)   Smokeless tobacco: Never  Vaping Use   Vaping status: Some Days  Substance Use Topics   Alcohol use: Yes    Comment: rarely   Drug use: No    Current Outpatient Medications  Medication Sig Dispense Refill   albuterol (VENTOLIN HFA) 108 (90 Base) MCG/ACT inhaler Inhale 2 puffs into the lungs every 6 (six) hours as needed for wheezing or shortness of breath. 8 g 1   cholecalciferol (VITAMIN D3) 25 MCG (1000 UNIT) tablet Take 1,000 Units by mouth daily.     Coenzyme Q10 (COQ10) 100 MG CAPS Take 100 mg by mouth daily.     losartan (COZAAR) 50 MG tablet Take 1 tablet (50 mg total) by mouth daily. 90 tablet 3   rosuvastatin (CRESTOR) 10 MG tablet Take 1 tablet (10 mg total) by mouth daily. 90 tablet 3   sulfamethoxazole-trimethoprim (BACTRIM DS) 800-160 MG tablet Take 1 tablet by mouth 2 (two) times daily. 14 tablet  0   Tiotropium Bromide-Olodaterol (STIOLTO RESPIMAT) 2.5-2.5 MCG/ACT AERS INHALE 2 PUFFS BY MOUTH ONCE DAILY 4 g 11   No current facility-administered medications for this visit.   No Known Allergies   Review of Systems: All systems reviewed and negative except where noted in HPI.    CT PANCREAS ABDOMEN W WO CONTRAST  Result Date: 09/19/2023 CLINICAL DATA:  Pancreatic abnormality on PET EXAM: CT ABDOMEN WITHOUT AND WITH CONTRAST TECHNIQUE: Multidetector CT imaging of the abdomen was performed following the standard protocol before and following the bolus administration of intravenous contrast. RADIATION DOSE REDUCTION: This exam was performed according to the departmental dose-optimization program which includes automated exposure control, adjustment of the mA and/or kV according to patient size and/or use of iterative reconstruction technique. CONTRAST:  OMNIPAQUE IOHEXOL 300 MG/ML  SOLN COMPARISON:  PET-CT dated 06/10/2023 FINDINGS: Lower chest: Lung bases are clear. Hepatobiliary: Liver is within normal limits. Layering small gallstones, without associated inflammatory changes. No intrahepatic or extrahepatic duct dilatation. Pancreas: Atrophy in the pancreatic body/tail. Main pancreatic ductal dilatation measuring  up to 10 mm (series 4/image 42), with abrupt cut off in the pancreatic head. Associated 3.2 x 2.5 cm pancreatic head mass (series 4/image 46), highly suspicious for pancreatic adenocarcinoma. Spleen: Within normal limits. Adrenals/Urinary Tract: Adrenal glands normal limits. Kidneys are within normal limits. No hydronephrosis. Stomach/Bowel: Stomach is within normal limits. Visualized bowel is grossly unremarkable. Vascular/Lymphatic: No evidence of abdominal aortic aneurysm. Atherosclerotic calcifications of the abdominal aorta and branch vessels, although vessels remain patent. Tumor abuts the SMV without definite invasion. Other: No abdominal ascites Musculoskeletal: Mild degenerative changes of the visualized thoracolumbar spine. IMPRESSION: 3.2 cm pancreatic head mass, consistent with pancreatic adenocarcinoma. No evidence of metastatic disease. Cholelithiasis, without associated inflammatory changes. Electronically Signed   By: Charline Bills M.D.   On: 09/19/2023 01:04    Physical Exam: BP 122/68   Pulse 87   Ht 5\' 5"  (1.651 m)   Wt 139 lb 12.8 oz (63.4 kg)   SpO2 94%   BMI 23.26 kg/m  Constitutional: Pleasant,well-developed, Caucasian female in no acute distress.  Uses cane to assist with ambulation HEENT: Normocephalic and atraumatic. Conjunctivae are normal. No scleral icterus. Neck supple.  Cardiovascular: Normal rate, regular rhythm.  Pulmonary/chest: Effort normal and breath sounds normal. No wheezing, rales or rhonchi. Abdominal: Soft, nondistended, nontender. Bowel sounds active throughout. There are no masses palpable. No hepatomegaly.  Will healed surgical port scars Extremities: no edema Neurological: Alert and oriented to person place and time. Skin: Skin is warm and dry. No rashes noted. Psychiatric: Normal mood and affect. Behavior is normal.  CBC    Component Value Date/Time   WBC 5.3 08/06/2023 1409   WBC 8.0 07/19/2023 0415   RBC 4.26 08/06/2023 1409   HGB 13.0  08/06/2023 1409   HGB 12.4 05/08/2023 0953   HCT 38.7 08/06/2023 1409   HCT 38.0 05/08/2023 0953   PLT 196 08/06/2023 1409   PLT 207 05/08/2023 0953   MCV 90.8 08/06/2023 1409   MCV 93 05/08/2023 0953   MCH 30.5 08/06/2023 1409   MCHC 33.6 08/06/2023 1409   RDW 12.8 08/06/2023 1409   RDW 12.2 05/08/2023 0953   LYMPHSABS 1.1 08/06/2023 1409   LYMPHSABS 1.0 05/08/2023 0953   MONOABS 0.5 08/06/2023 1409   EOSABS 0.3 08/06/2023 1409   EOSABS 0.1 05/08/2023 0953   BASOSABS 0.1 08/06/2023 1409   BASOSABS 0.0 05/08/2023 0953    CMP     Component  Value Date/Time   NA 138 08/06/2023 1409   NA 137 05/08/2023 0953   K 3.9 08/06/2023 1409   CL 101 08/06/2023 1409   CO2 31 08/06/2023 1409   GLUCOSE 117 (H) 08/06/2023 1409   BUN 10 08/06/2023 1409   BUN 17 05/08/2023 0953   CREATININE 0.71 08/06/2023 1409   CREATININE 0.72 12/02/2017 0945   CALCIUM 9.5 08/06/2023 1409   PROT 7.3 08/06/2023 1409   PROT 7.1 05/08/2023 0953   ALBUMIN 3.9 08/06/2023 1409   ALBUMIN 4.2 05/08/2023 0953   AST 16 08/06/2023 1409   ALT 13 08/06/2023 1409   ALKPHOS 95 08/06/2023 1409   BILITOT 0.4 08/06/2023 1409   GFRNONAA >60 08/06/2023 1409   GFRAA 60 09/26/2020 1245       Latest Ref Rng & Units 08/06/2023    2:09 PM 07/23/2023   12:42 PM 07/19/2023    2:58 PM  CBC EXTENDED  WBC 4.0 - 10.5 K/uL 5.3  7.0    RBC 3.87 - 5.11 MIL/uL 4.26  4.44    Hemoglobin 12.0 - 15.0 g/dL 40.9  81.1  91.4   HCT 36.0 - 46.0 % 38.7  40.3  39.1   Platelets 150 - 400 K/uL 196  245    NEUT# 1.7 - 7.7 K/uL 3.3     Lymph# 0.7 - 4.0 K/uL 1.1         ASSESSMENT AND PLAN: 77 year old female with recently diagnosed adenocarcinoma of the lung, status post XRT x 3, history of 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  She is asymptomatic, but has lost 20 pounds. Based on the CT results, the lesion may be resectable.  The patient states she is interested in pursuing all the diagnostic and therapeutic interventions recommended to treat suspected pancreatic cancer. She needs an endoscopic ultrasound to confirm suspicion of pancreatic cancer.  I reviewed the details of an endoscopic ultrasound with FNA with the patient.  We discussed the potential complications to include bleeding, infection, pancreatitis, perforation and pain, as well as the potential complications from sedation.  The patient indicated understanding and a desire to proceed with the EUS as soon as we can do it.  I have messaged Dr. Meridee Score about this patient, and it appears he will be able to perform her EUS/FNA on November 11.  Pancreatic mass, highly suspicious for adenocarcinoma - Diagnostic EUS with FNA by Dr. Meridee Score, Nov 11th -Obtain baseline CA 19-9 - Repeat CMP to ensure no evidence of biliary obstruction  Samya Siciliano E. Tomasa Rand, MD [facility]  I spent a total of 45 minutes reviewing the patient's medical record, interviewing and examining the patient, discussing her diagnosis and management of her condition going forward, and documenting in the medical record   Joselyn Arrow, MD

## 2023-09-24 NOTE — Telephone Encounter (Signed)
-----   Message from [facility] sent at 09/24/2023  4:18 PM EDT ----- Regarding: RE: New pancreatic mass, likely adeno SEC, We'll get her in. Looks like CA19-9 is cooking.  Shaianne Nucci, Schedule her for November 11, Upper EUS. Thanks. GM ----- Message ----- From: Jenel Lucks, MD Sent: 09/24/2023   3:10 PM EDT To: Loretha Stapler, RN; Lemar Lofty., MD Subject: New pancreatic mass, likely adeno              GM,  I saw this lady in clinic today.  Has very suspicious mass near head causing PD dilation, but CBD appears normal, most recent LAEs normal. No symptoms.  Has unfortunate history of recurrent ovarian granulosa cell tumor s/p resection in 2007 and then again this August as well as new diagnosis of adenocarcinoma of the lung diagnosed this summer and getting treated with XRT.  Any idea when you would be able to get her in?

## 2023-09-24 NOTE — Patient Instructions (Addendum)
Dr.Mansouraty's nurse will call to schedule an Endoscopic ultrasound.  Your provider has requested that you go to the basement level for lab work before leaving today. Press "B" on the elevator. The lab is located at the first door on the left as you exit the elevator.  If your blood pressure at your visit was 140/90 or greater, please contact your primary care physician to follow up on this.  _______________________________________________________  If you are age 33 or older, your body mass index should be between 23-30. Your Body mass index is 23.26 kg/m. If this is out of the aforementioned range listed, please consider follow up with your Primary Care Provider.  If you are age 14 or younger, your body mass index should be between 19-25. Your Body mass index is 23.26 kg/m. If this is out of the aformentioned range listed, please consider follow up with your Primary Care Provider.   ________________________________________________________  The  GI providers would like to encourage you to use [facility] to communicate with providers for non-urgent requests or questions.  Due to long hold times on the telephone, sending your provider a message by [facility] may be a faster and more efficient way to get a response.  Please allow 48 business hours for a response.  Please remember that this is for non-urgent requests.  _______________________________________________________     It was a pleasure to see you today!  Thank you for trusting me with your gastrointestinal care!    Scott E.Tomasa Rand, MD

## 2023-09-24 NOTE — Telephone Encounter (Addendum)
EUS set up for 11/11 at [facility] with GM at 1245 pm  Left message on machine to call back

## 2023-09-25 LAB — CANCER ANTIGEN 19-9: CA 19-9: 33 U/mL (ref <34)

## 2023-09-25 NOTE — Telephone Encounter (Signed)
EUS scheduled, pt instructed and medications reviewed.  Patient instructions mailed to home.  Patient to call with any questions or concerns.  

## 2023-09-27 ENCOUNTER — Encounter (HOSPITAL_COMMUNITY): Payer: Self-pay | Admitting: Gastroenterology

## 2023-09-30 NOTE — Progress Notes (Signed)
Liver enzymes normal.  No suspicion for biliary obstruction.  CEA normal.  Patient is scheduled for EUS next week.

## 2023-10-07 ENCOUNTER — Ambulatory Visit (HOSPITAL_COMMUNITY): Payer: Medicare Other | Admitting: Anesthesiology

## 2023-10-07 ENCOUNTER — Encounter (HOSPITAL_COMMUNITY)
Admission: RE | Disposition: A | Discharge status: Home / Self Care | Payer: Self-pay | Source: Home / Self Care | Attending: Gastroenterology

## 2023-10-07 ENCOUNTER — Other Ambulatory Visit: Payer: Self-pay

## 2023-10-07 ENCOUNTER — Ambulatory Visit (HOSPITAL_COMMUNITY): Payer: Medicare Other

## 2023-10-07 ENCOUNTER — Encounter (HOSPITAL_COMMUNITY): Payer: Self-pay | Admitting: Gastroenterology

## 2023-10-07 ENCOUNTER — Ambulatory Visit (HOSPITAL_COMMUNITY)
Admission: RE | Admit: 2023-10-07 | Discharge: 2023-10-07 | Disposition: A | Discharge status: Home / Self Care | Payer: Medicare Other | Attending: Gastroenterology | Admitting: Gastroenterology

## 2023-10-07 DIAGNOSIS — I10 Essential (primary) hypertension: Secondary | ICD-10-CM | POA: Diagnosis not present

## 2023-10-07 DIAGNOSIS — I899 Noninfective disorder of lymphatic vessels and lymph nodes, unspecified: Secondary | ICD-10-CM | POA: Insufficient documentation

## 2023-10-07 DIAGNOSIS — J449 Chronic obstructive pulmonary disease, unspecified: Secondary | ICD-10-CM | POA: Diagnosis not present

## 2023-10-07 DIAGNOSIS — K449 Diaphragmatic hernia without obstruction or gangrene: Secondary | ICD-10-CM

## 2023-10-07 DIAGNOSIS — I2510 Atherosclerotic heart disease of native coronary artery without angina pectoris: Secondary | ICD-10-CM | POA: Diagnosis not present

## 2023-10-07 DIAGNOSIS — C250 Malignant neoplasm of head of pancreas: Secondary | ICD-10-CM | POA: Insufficient documentation

## 2023-10-07 DIAGNOSIS — K8689 Other specified diseases of pancreas: Secondary | ICD-10-CM

## 2023-10-07 DIAGNOSIS — K8020 Calculus of gallbladder without cholecystitis without obstruction: Secondary | ICD-10-CM | POA: Diagnosis not present

## 2023-10-07 DIAGNOSIS — K2990 Gastroduodenitis, unspecified, without bleeding: Secondary | ICD-10-CM

## 2023-10-07 DIAGNOSIS — K2970 Gastritis, unspecified, without bleeding: Secondary | ICD-10-CM

## 2023-10-07 DIAGNOSIS — K2289 Other specified disease of esophagus: Secondary | ICD-10-CM | POA: Diagnosis not present

## 2023-10-07 DIAGNOSIS — R918 Other nonspecific abnormal finding of lung field: Secondary | ICD-10-CM | POA: Diagnosis not present

## 2023-10-07 DIAGNOSIS — K3189 Other diseases of stomach and duodenum: Secondary | ICD-10-CM | POA: Diagnosis not present

## 2023-10-07 DIAGNOSIS — I739 Peripheral vascular disease, unspecified: Secondary | ICD-10-CM | POA: Insufficient documentation

## 2023-10-07 DIAGNOSIS — R109 Unspecified abdominal pain: Secondary | ICD-10-CM | POA: Diagnosis not present

## 2023-10-07 HISTORY — PX: FINE NEEDLE ASPIRATION: SHX5430

## 2023-10-07 HISTORY — PX: EUS: SHX5427

## 2023-10-07 HISTORY — PX: BIOPSY: SHX5522

## 2023-10-07 HISTORY — PX: ESOPHAGOGASTRODUODENOSCOPY (EGD) WITH PROPOFOL: SHX5813

## 2023-10-07 SURGERY — UPPER ENDOSCOPIC ULTRASOUND (EUS) RADIAL
Anesthesia: Monitor Anesthesia Care

## 2023-10-07 MED ORDER — SODIUM CHLORIDE 0.9 % IV SOLN: INTRAVENOUS | Status: DC

## 2023-10-07 MED ORDER — OXYCODONE HCL 5 MG PO TABS: 5.0000 mg | ORAL_TABLET | Freq: Once | ORAL | Status: DC | PRN

## 2023-10-07 MED ORDER — ONDANSETRON HCL 4 MG/2ML IJ SOLN: 4.0000 mg | Freq: Once | INTRAMUSCULAR | Status: DC | PRN

## 2023-10-07 MED ORDER — FENTANYL CITRATE (PF) 100 MCG/2ML IJ SOLN
25.0000 ug | Freq: Once | INTRAMUSCULAR | Status: AC
  Administered 2023-10-07: 25 ug via INTRAVENOUS

## 2023-10-07 MED ORDER — FENTANYL CITRATE (PF) 100 MCG/2ML IJ SOLN
INTRAMUSCULAR | Status: AC
  Filled 2023-10-07: qty 2

## 2023-10-07 MED ORDER — PROPOFOL 10 MG/ML IV BOLUS
INTRAVENOUS | Status: DC | PRN
  Administered 2023-10-07: 20 mg via INTRAVENOUS
  Administered 2023-10-07 (×2): 10 mg via INTRAVENOUS

## 2023-10-07 MED ORDER — PROPOFOL 10 MG/ML IV BOLUS
INTRAVENOUS | Status: AC
  Filled 2023-10-07: qty 20

## 2023-10-07 MED ORDER — OXYCODONE HCL 5 MG/5ML PO SOLN: 5.0000 mg | Freq: Once | ORAL | Status: DC | PRN

## 2023-10-07 MED ORDER — CIPROFLOXACIN IN D5W 400 MG/200ML IV SOLN
INTRAVENOUS | Status: AC
Start: 2023-10-07 — End: ?
  Filled 2023-10-07: qty 200

## 2023-10-07 MED ORDER — PROPOFOL 500 MG/50ML IV EMUL
INTRAVENOUS | Status: DC | PRN
  Administered 2023-10-07: 125 ug/kg/min via INTRAVENOUS

## 2023-10-07 MED ORDER — ACETAMINOPHEN 500 MG PO TABS: 1000.0000 mg | ORAL_TABLET | Freq: Three times a day (TID) | ORAL | Status: DC | PRN

## 2023-10-07 MED ORDER — ACETAMINOPHEN 10 MG/ML IV SOLN: 1000.0000 mg | Freq: Once | INTRAVENOUS | Status: DC | PRN

## 2023-10-07 MED ORDER — SODIUM CHLORIDE 0.9 % IV SOLN
INTRAVENOUS | Status: DC | PRN
Start: 2023-10-07 — End: 2023-10-07

## 2023-10-07 MED ORDER — FENTANYL CITRATE (PF) 100 MCG/2ML IJ SOLN: 25.0000 ug | INTRAMUSCULAR | Status: DC | PRN

## 2023-10-07 MED ORDER — CIPROFLOXACIN IN D5W 400 MG/200ML IV SOLN
INTRAVENOUS | Status: DC | PRN
  Administered 2023-10-07: 400 mg via INTRAVENOUS

## 2023-10-07 MED ORDER — LIDOCAINE 2% (20 MG/ML) 5 ML SYRINGE
INTRAMUSCULAR | Status: DC | PRN
  Administered 2023-10-07: 60 mg via INTRAVENOUS

## 2023-10-07 MED ORDER — FENTANYL CITRATE (PF) 100 MCG/2ML IJ SOLN
25.0000 ug | INTRAMUSCULAR | Status: AC
  Administered 2023-10-07: 25 ug via INTRAVENOUS

## 2023-10-07 NOTE — Progress Notes (Addendum)
Still awaiting result from CXR / KUB. Pt expressed desire to be discharged. [facility] Radiology to ascertain status of STAT read. Per [facility] Radiology, imaging is in as STAT, however, they are unable to provide a time estimate as to when the imaging will be read. MD Mansouraty notified. Per MD Mansouraty, if patient wishes, she is able to go home, however, if the radiologist's reading of the imaging reveals perforation or other issue, pt would need to return to the hospital.   Discussed this with the patient and husband. Pt wishes to wait for radiology read. Pt denies pain at this time.  Eulas Post, RN 10/07/23 4:04 PM  Addendum 1728: Still awaiting radiologist's read of CXR/KUB. Pt continues to deny pain, resting comfortably. MD Mansouraty notified. Per MD Mansouraty, okay to discharge the patient. Per MD Mansouraty, pt is appropriate for discharge to home. States he will personally call the patient if imaging returns abnormal. Discussed this with pt and husband. Pt and husband agreeable to this plan. Pt discharged to home in care of husband. At 1736, x-rays resulted. MD Mansouraty notified.

## 2023-10-07 NOTE — Discharge Instructions (Signed)

## 2023-10-07 NOTE — Op Note (Signed)
[facility] Patient Name: Tiffany [name] Procedure Date: 10/07/2023 MRN: [id number] Attending MD: Corliss Parish , MD, [id number] Date of Birth: [birth year] CSN: [id number] Age: 77 Admit Type: Ambulatory Procedure:                Upper EUS Indications:              Suspected mass in pancreas on CT scan Providers:                Corliss Parish, MD, Autumn Mayer Masker Marja Kays, Technician Referring MD:             Dub Amis. Tomasa Rand, MD Medicines:                Monitored Anesthesia Care, Cipro 400 mg IV Complications:            No immediate complications. Estimated Blood Loss:     Estimated blood loss was minimal. Procedure:                Pre-Anesthesia Assessment:                           - Prior to the procedure, a History and Physical                            was performed, and patient medications and                            allergies were reviewed. The patient's tolerance of                            previous anesthesia was also reviewed. The risks                            and benefits of the procedure and the sedation                            options and risks were discussed with the patient.                            All questions were answered, and informed consent                            was obtained. Prior Anticoagulants: The patient has                            taken no anticoagulant or antiplatelet agents. ASA                            Grade Assessment: III - A patient with severe                            systemic disease. After reviewing the risks and  benefits, the patient was deemed in satisfactory                            condition to undergo the procedure.                           After obtaining informed consent, the endoscope was                            passed under direct vision. Throughout the                            procedure, the patient's blood  pressure, pulse, and                            oxygen saturations were monitored continuously. The                            [id number]-H190 ([id number]) Olympus endoscope was introduced                            through the mouth, and advanced to the second part                            of duodenum. The [id number]-[id number] ([id number]) Olympus                            duodenoscope was introduced through the mouth, and                            advanced to the area of papilla. The [id number]                            ([id number]) Olympus linear ultrasound scope was                            introduced through the mouth, and advanced to the                            duodenum for ultrasound examination from the                            stomach and duodenum. The upper EUS was                            accomplished without difficulty. The patient                            tolerated the procedure. Scope In: Scope Out: Findings:      ENDOSCOPIC FINDING: :      No gross lesions were noted in the entire esophagus.      The Z-line was irregular and was found 39 cm from the incisors.      A 2 cm hiatal hernia was present.  Patchy mildly erythematous mucosa without bleeding was found in the       entire examined stomach. Biopsies were taken with a cold forceps for       histology and Helicobacter pylori testing.      No gross lesions were noted in the duodenal bulb, in the first portion       of the duodenum and in the second portion of the duodenum.      The major papilla was normal, hidden under enlarged/tickened folds.      ENDOSONOGRAPHIC FINDING: :      An irregular lesion was identified in the pancreatic head. The lesion       was heterogenous with some hypoechogenicity. The lesion measured 35 mm       by 25 mm in maximal cross-sectional diameter. The endosonographic       borders were poorly-defined. An intact interface was seen between the       lesion and the superior mesenteric artery, celiac  trunk and portal vein       suggesting a lack of invasion. This lesion is noted where the bile duct       inserts and where the pancreas duct inserts and dilates. The remainder       of the pancreas was examined. The endosonographic appearance of       parenchyma and the upstream pancreatic duct indicated duct dilation (PDH       = 6.3 mm, PDN = 8.4 mm, PDB = 6.0 mm, PDT = 4.2 -> 2.2 mm), an       irregularly contoured duct, prominent ductal side-branches, a       tortuous/ectatic duct and parenchymal atrophy noted throughout. Fine       needle biopsy was performed. Color Doppler imaging was utilized prior to       needle puncture to confirm a lack of significant vascular structures       within the needle path. Six passes were made with the 22 gauge Acquire       biopsy needle using a transduodenal approach. Visible cores of tissue       were obtained. A preliminary cytologic examination was performed.       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      Pancreatic parenchymal abnormalities were noted in the entire pancreas.       These consisted of atrophy.      There was no sign of significant endosonographic abnormality in the       common bile duct and in the common hepatic duct. No stones and ducts       with regular contour were identified.      Multiple stones were visualized endosonographically in the gallbladder.       The stones were round. They were hyperechoic and characterized by       shadowing.      Endosonographic imaging in the visualized portion of the liver showed no       mass.      No malignant-appearing lymph nodes were visualized in the celiac region       (level 20), peripancreatic region and porta hepatis region.      The celiac region was visualized. Impression:               EGD impression:                           -  No  gross lesions in the entire esophagus. Z-line                            irregular, 39 cm from the incisors.                           - 2 cm hiatal hernia.                           - Erythematous mucosa in the stomach. Biopsied.                           - No gross lesions in the duodenal bulb, in the                            first portion of the duodenum and in the second                            portion of the duodenum.                           - Normal major papilla (hidden under thickened                            folds).                           EUS impression:                           - A lesion was identified in the pancreatic head.                            Cytology results are pending. However, the                            endosonographic appearance is suspicious for                            possible malignancy though SPN should also be                            considered (though not normally leading to                            pancreatic atrophy and significant ductal                            dilation). This was staged T2 N0 Mx by                            endosonographic criteria and the staging applies if                            malignancy is confirmed. Fine needle biopsy  performed as noted above.                           - Pancreatic parenchymal abnormalities consisting                            of atrophy were noted in the entire pancreas.                           - There was no sign of significant pathology in the                            common bile duct and in the common hepatic duct.                           - Multiple stones were visualized                            endosonographically in the gallbladder.                           - No malignant-appearing lymph nodes were                            visualized in the celiac region (level 20),                            peripancreatic region and porta hepatis  region. Moderate Sedation:      Not Applicable - Patient had care per Anesthesia. Recommendation:           - The patient will be observed post-procedure,                            until all discharge criteria are met.                           - Discharge patient to home.                           - Patient has a contact number available for                            emergencies. The signs and symptoms of potential                            delayed complications were discussed with the                            patient. Return to normal activities tomorrow.                            Written discharge instructions were provided to the                            patient.                           -  Low fat diet.                           - Monitor for signs/symptoms of pancreatitis,                            bleeding, perforation, and infection. If issues                            please call our number to get further assistance as                            needed.                           - Observe patient's clinical course.                           - Await cytology results and await path results.                           - The findings and recommendations were discussed                            with the patient.                           - The findings and recommendations were discussed                            with the patient's family. Procedure Code(s):        --- Professional ---                           [phone], Esophagogastroduodenoscopy, flexible,                            transoral; with transendoscopic ultrasound-guided                            intramural or transmural fine needle                            aspiration/biopsy(s), (includes endoscopic                            ultrasound examination limited to the esophagus,                            stomach or duodenum, and adjacent structures)                           43239, 59, Esophagogastroduodenoscopy,  flexible,                            transoral; with biopsy, single or multiple Diagnosis Code(s):        --- Professional ---  K22.89, Other specified disease of esophagus                           K44.9, Diaphragmatic hernia without obstruction or                            gangrene                           K31.89, Other diseases of stomach and duodenum                           K86.89, Other specified diseases of pancreas                           K86.9, Disease of pancreas, unspecified                           K80.20, Calculus of gallbladder without                            cholecystitis without obstruction                           I89.9, Noninfective disorder of lymphatic vessels                            and lymph nodes, unspecified                           R93.3, Abnormal findings on diagnostic imaging of                            other parts of digestive tract CPT copyright 2022 American [employer] Association. All rights reserved. The codes documented in this report are preliminary and upon coder review may  be revised to meet current compliance requirements. Corliss Parish, MD 10/07/2023 1:38:38 PM Number of Addenda: 0

## 2023-10-07 NOTE — Anesthesia Preprocedure Evaluation (Signed)
Anesthesia Evaluation  Patient identified by MRN, date of birth, ID band Patient awake    Reviewed: Allergy & Precautions, NPO status , Patient's Chart, lab work & pertinent test results, reviewed documented beta blocker date and time   History of Anesthesia Complications Negative for: history of anesthetic complications  Airway Mallampati: III  TM Distance: >3 FB     Dental  (+) Upper Dentures, Lower Dentures   Pulmonary shortness of breath, pneumonia, COPD,  COPD inhaler, neg recent URI, former smoker   breath sounds clear to auscultation       Cardiovascular hypertension, + CAD and + Peripheral Vascular Disease  (-) Past MI, (-) Cardiac Stents and (-) CHF  Rhythm:Regular Rate:Normal     Neuro/Psych neg Seizures    GI/Hepatic ,,,(+) neg Cirrhosis        Endo/Other    Renal/GU Renal disease     Musculoskeletal  (+) Arthritis ,    Abdominal   Peds  Hematology   Anesthesia Other Findings   Reproductive/Obstetrics                              Anesthesia Physical Anesthesia Plan  ASA: 3  Anesthesia Plan: MAC   Post-op Pain Management:    Induction:   PONV Risk Score and Plan: 1 and Ondansetron and Propofol infusion  Airway Management Planned:   Additional Equipment:   Intra-op Plan:   Post-operative Plan:   Informed Consent: I have reviewed the patients History and Physical, chart, labs and discussed the procedure including the risks, benefits and alternatives for the proposed anesthesia with the patient or authorized representative who has indicated his/her understanding and acceptance.     Dental advisory given  Plan Discussed with: CRNA  Anesthesia Plan Comments:          Anesthesia Quick Evaluation

## 2023-10-07 NOTE — H&P (Signed)
GASTROENTEROLOGY PROCEDURE H&P NOTE   Primary Care Physician: Joselyn Arrow, MD  HPI: Tiffany [name] is a 77 y.o. female who presents for EGD/EUS to evaluate upper GI tract and the pancreas in setting of imaging concerning for mass in HOP.  Past Medical History:  Diagnosis Date   Abnormal PFT 09/02/2014   moderate restrictive ventilatory defect.   Arthritis    hip   BCC (basal cell carcinoma), face 2010   Dr. Terri Piedra   Colon polyp 12/2003   Diverticulosis 11/2010   seen on colonoscopy and air contrast BE   Emphysema of lung (HCC)    Family history of breast cancer    Hyperlipidemia    Hypertension    previously treated with meds, resolved   MGUS (monoclonal gammopathy of unknown significance) 1999   prevously under care of Dr. 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   Tobacco abuse    quit in 2019   Past Surgical History:  Procedure Laterality Date   BASAL CELL CARCINOMA EXCISION  11/26/2008   L face, R temple   BILATERAL SALPINGOOPHORECTOMY  11/26/2005   cancerous tumor R ovary   BRONCHIAL BIOPSY  07/09/2023   Procedure: BRONCHIAL BIOPSIES;  Surgeon: Leslye Peer, MD;  Location: [facility] ENDOSCOPY;  Service: Pulmonary;;   BRONCHIAL BRUSHINGS  07/09/2023   Procedure: BRONCHIAL BRUSHINGS;  Surgeon: Leslye Peer, MD;  Location[facility] ENDOSCOPY;  Service: Pulmonary;;   BRONCHIAL NEEDLE ASPIRATION BIOPSY  07/09/2023   Procedure: BRONCHIAL NEEDLE ASPIRATION BIOPSIES;  Surgeon: Leslye Peer, MD;  Location: [facility] ENDOSCOPY;  Service: Pulmonary;;   CATARACT EXTRACTION  11/27/2007   bilateral   COLONOSCOPY  11/26/2010   Dr. Laural Benes. Diverticulosis. BE also done--normal   FIDUCIAL MARKER PLACEMENT  07/09/2023   Procedure: FIDUCIAL MARKER PLACEMENT;  Surgeon: Leslye Peer, MD;  Location:  [facility] ENDOSCOPY;  Service: Pulmonary;;   OMENTECTOMY  11/26/2005   and pelvic lymphadenectomy (with BSO)   ROBOTIC ASSISTED LAPAROSCOPIC LYSIS OF ADHESION N/A 07/18/2023   Procedure: ROBOTIC ASSISTED PELVIC MASS EXCISION WITH RADICAL DISSECTION, MINI LAPAROTOMY, CYSTOSCOPY;  Surgeon: Carver Fila, MD;  Location: [facility] ORS;  Service: Gynecology;  Laterality: N/A;   SKIN CANCER EXCISION Left 07/2021   neck; pt can't recall type of cancer   TONSILLECTOMY  age 3   VAGINAL HYSTERECTOMY  late 20's   "didn't want any more children"   No current facility-administered medications for this encounter.   No current facility-administered medications for this encounter. No Known Allergies Family History  Problem Relation Age of Onset   Stroke Mother    Cerebral aneurysm Mother    Hypertension Father    Suicidality Father    Diabetes Maternal Aunt    Diabetes Maternal Aunt    Breast cancer Maternal Aunt    Diabetes Maternal Aunt    Breast cancer Maternal Aunt    Throat cancer Maternal Uncle    Lung cancer Maternal Uncle    Colon cancer Neg Hx    Ovarian cancer Neg Hx    Endometrial cancer Neg Hx    Pancreatic cancer Neg Hx    Social History   Socioeconomic History   Marital status: Married    Spouse name: Not  on file   Number of children: 1   Years of education: Not on file   Highest education level: Not on file  Occupational History   Occupation: retired Warehouse manager company)    Employer: RETIRED  Tobacco Use   Smoking status: Former    Current packs/day: 0.00    Average packs/day: 2.0 packs/day for 59.8 years (119.6 ttl pk-yrs)    Types: Cigarettes    Start date: 9    Quit date: 09/10/2018    Years since quitting: 5.0    Passive exposure: Past (quit in 2019)   Smokeless tobacco: Never  Vaping Use   Vaping status: Some Days  Substance and Sexual Activity   Alcohol use: Yes    Comment: rarely   Drug use: No   Sexual activity: Not Currently    Partners: Male  Other  Topics Concern   Not on file  Social History Narrative   Lives with husband, 1 cat.  Son lives with him during the week, stays with his girlfriend on the weekends (divorced).   1 grandson.  Expecting a great-grandbaby      Updated 04/2023   Social Determinants of Health   Financial Resource Strain: Not on file  Food Insecurity: No Food Insecurity (08/14/2023)   Hunger Vital Sign    Worried About Running Out of Food in the Last Year: Never true    Ran Out of Food in the Last Year: Never true  Transportation Needs: No Transportation Needs (08/14/2023)   PRAPARE - Administrator, Civil Service (Medical): No    Lack of Transportation (Non-Medical): No  Physical Activity: Not on file  Stress: Not on file  Social Connections: Not on file  Intimate Partner Violence: Not At Risk (08/14/2023)   Humiliation, Afraid, Rape, and Kick questionnaire    Fear of Current or Ex-Partner: No    Emotionally Abused: No    Physically Abused: No    Sexually Abused: No    Physical Exam: There were no vitals filed for this visit. There is no height or weight on file to calculate BMI. GEN: NAD EYE: Sclerae anicteric ENT: MMM CV: Non-tachycardic GI: Soft, NT/ND NEURO:  Alert & Oriented x 3  Lab Results: No results for input(s): "WBC", "HGB", "HCT", "PLT" in the last 72 hours. BMET No results for input(s): "NA", "K", "CL", "CO2", "GLUCOSE", "BUN", "CREATININE", "CALCIUM" in the last 72 hours. LFT No results for input(s): "PROT", "ALBUMIN", "AST", "ALT", "ALKPHOS", "BILITOT", "BILIDIR", "IBILI" in the last 72 hours. PT/INR No results for input(s): "LABPROT", "INR" in the last 72 hours.   Impression / Plan: This is a 77 y.o.female who presents for EGD/EUS to evaluate upper GI tract and the pancreas in setting of imaging concerning for mass in HOP.  The risks of an EUS including intestinal perforation, bleeding, infection, aspiration, and medication effects were discussed as was the  possibility it may not give a definitive diagnosis if a biopsy is performed.  When a biopsy of the pancreas is done as part of the EUS, there is an additional risk of pancreatitis at the rate of about 1-2%.  It was explained that procedure related pancreatitis is typically mild, although it can be severe and even life threatening, which is why we do not perform random pancreatic biopsies and only biopsy a lesion/area we feel is concerning enough to warrant the risk.  The risks and benefits of endoscopic evaluation/treatment were discussed with the patient and/or family; these include but are  not limited to the risk of perforation, infection, bleeding, missed lesions, lack of diagnosis, severe illness requiring hospitalization, as well as anesthesia and sedation related illnesses.  The patient's history has been reviewed, patient examined, no change in status, and deemed stable for procedure.  The patient and/or family is agreeable to proceed.    Corliss Parish, MD [facility] Office # [phone]

## 2023-10-07 NOTE — Anesthesia Postprocedure Evaluation (Signed)
Anesthesia Post Note  Patient: Tiffany [name]  Procedure(s) Performed: UPPER ENDOSCOPIC ULTRASOUND (EUS) RADIAL BIOPSY FINE NEEDLE ASPIRATION (FNA) LINEAR     Patient location during evaluation: PACU Anesthesia Type: MAC Level of consciousness: awake and alert Pain management: pain level controlled Vital Signs Assessment: post-procedure vital signs reviewed and stable Respiratory status: spontaneous breathing, nonlabored ventilation, respiratory function stable and patient connected to nasal cannula oxygen Cardiovascular status: stable and blood pressure returned to baseline Postop Assessment: no apparent nausea or vomiting Anesthetic complications: no   No notable events documented.  Last Vitals:  Vitals:   10/07/23 1430 10/07/23 1440  BP: (!) 153/40 (!) 148/61  Pulse: 64 (!) 56  Resp: 15 15  Temp:    SpO2: 94% 96%    Last Pain:  Vitals:   10/07/23 1440  TempSrc:   PainSc: 0-No pain                 Mariann Barter

## 2023-10-07 NOTE — Transfer of Care (Signed)
Immediate Anesthesia Transfer of Care Note  Patient: Tiffany [name]  Procedure(s) Performed: UPPER ENDOSCOPIC ULTRASOUND (EUS) RADIAL BIOPSY FINE NEEDLE ASPIRATION (FNA) LINEAR  Patient Location: PACU  Anesthesia Type:MAC  Level of Consciousness: drowsy  Airway & Oxygen Therapy: Patient Spontanous Breathing and Patient connected to face mask oxygen  Post-op Assessment: Report given to RN and Post -op Vital signs reviewed and stable  Post vital signs: Reviewed and stable  Last Vitals:  Vitals Value Taken Time  BP 157/117 10/07/23 1321  Temp    Pulse 62 10/07/23 1324  Resp 19 10/07/23 1324  SpO2 100 % 10/07/23 1324  Vitals shown include unfiled device data.  Last Pain:  Vitals:   10/07/23 1202  TempSrc: Temporal  PainSc: 0-No pain         Complications: No notable events documented.

## 2023-10-07 NOTE — Progress Notes (Signed)
Patient evaluated post-procedure. Discussed results of EUS. Awaiting husband to make it to the hospital. Patient with 8-9/10 pain in MEG/subxiphoid region upon waking up. No radiation. Sharp/stabbing in quality. Hemodynamically stable otherwise. Received 25 mcg Fentanyl x1 dose and pain has improved to 5-6/10 currently. Will continue to monitor and plan another dose of Fentanyl 25 mcg in 15 minutes from prior administration. Will obtain KUB/CXR STAT to ensure no evidence of perforation or other complication. Sent message to discuss case briefly with [facility] about potential need for Inpatient admission observation for possible post-EUS complications/pancreatitis. Holding on EKG/Labs for now, but will further assess. Will continue fluids for now in PACU.  Addendum 50 mcg total of Fentanyl given. Pain at 0. CXR/KUB took 3 hours to read but are unremarkable for perforation. I'll forward results to PCP for followup. My Rn will reach out to patient tomorrow to see how she is doing. Pending EUS biopsy results. They agreed to this plan of action. Message left to patient and husband (whom we had discharged earlier since we were waiting for the imaging for so long).   Corliss Parish, MD [facility] Office # [phone]

## 2023-10-08 ENCOUNTER — Encounter: Payer: Self-pay | Admitting: Gastroenterology

## 2023-10-08 DIAGNOSIS — K8689 Other specified diseases of pancreas: Secondary | ICD-10-CM

## 2023-10-08 DIAGNOSIS — K2970 Gastritis, unspecified, without bleeding: Secondary | ICD-10-CM

## 2023-10-08 LAB — SURGICAL PATHOLOGY

## 2023-10-08 LAB — IGG 4: IgG, Subclass 4: 4 mg/dL (ref 2–96)

## 2023-10-09 ENCOUNTER — Encounter: Payer: Self-pay | Admitting: Radiation Oncology

## 2023-10-09 NOTE — Progress Notes (Signed)
Radiation Oncology         [phone] ________________________________  Name: Tiffany [name] MRN: [id number]  Date: 10/10/2023  DOB: Jan 13, 1946  Follow-Up Visit Note  CC: Joselyn Arrow, MD  Si Gaul, MD  No diagnosis found.  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 Presented with right upper lobe hypermetabolic lung mass as well as an additional suspicious right lower lobe lung nodule that was not hypermetabolic on PET imaging. 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 She tolerated radiation therapy well other than mild fatigue.   Since completing radiation therapy, the patient presented for a CT AP / pancreas on 09/09/23 to evaluate the pancreatic abnormality on her prior PET scan performed this past July. CT findings showed a 3.2 cm pancreatic head mass, consistent with pancreatic adenocarcinoma. No evidence of metastatic disease was demonstrated otherwise.   She was subsequently referred to Dr. Milana Kidney at [facility] on 09/24/23 for further management. Based on Dr. Milas Hock evaluation, the patient is asymptomatic from a GI standpoint.   Based on Dr. Milas Hock advice, she opted to proceed with an EUS with biopsies of the pancreatic mass on 10/07/23 under the care of Dr. Meridee Score. FNA of the pancreatic mass showed findings consistent with adenocarcinoma. A stomach biopsy was also collected and showed gastric antral and oxyntic  mucosa with mild hyperemia (also negative for Helicobacter pylori).   She is interested in pursuing surgery and any other recommended treatments for her pancreatic cancer and will continue to follow with GI to address this.                               ***  Allergies:  has No Known Allergies.  Meds: Current Outpatient Medications  Medication Sig Dispense Refill   acetaminophen (TYLENOL) 500 MG tablet Take 2 tablets (1,000 mg total) by mouth every 8 (eight) hours as needed for mild pain (pain score 1-3). May use Tylenol for next 1-3 days if needed for abdominal pain, but otherwise call office if any issues arise     albuterol (VENTOLIN HFA) 108 (90 Base) MCG/ACT inhaler Inhale 2 puffs into the lungs every 6 (six) hours as needed for wheezing or shortness of breath. 8 g 1   cholecalciferol (VITAMIN D3) 25 MCG (1000 UNIT) tablet Take 1,000 Units by mouth daily.     Coenzyme Q10 (COQ10) 100 MG CAPS Take 100 mg by mouth daily.     losartan (COZAAR) 50 MG tablet Take 1 tablet (50 mg total) by mouth daily. 90 tablet 3   rosuvastatin (CRESTOR) 10 MG tablet Take 1 tablet (10 mg total) by mouth daily. (Patient taking differently: Take 10 mg by mouth at bedtime.) 90 tablet 3   Tiotropium Bromide-Olodaterol (STIOLTO RESPIMAT) 2.5-2.5 MCG/ACT AERS INHALE 2 PUFFS BY MOUTH ONCE DAILY 4 g 11   No current facility-administered medications for this encounter.    Physical Findings: The patient is in no acute distress. Patient is alert and oriented.  vitals were not taken for this visit. .  No significant changes. Lungs are clear to auscultation bilaterally. Heart has regular rate and rhythm. No palpable cervical, supraclavicular, or axillary adenopathy. Abdomen soft, non-tender, normal bowel sounds.   Lab Findings: Lab Results  Component Value Date   WBC 5.3 08/06/2023   HGB 13.0 08/06/2023   HCT 38.7 08/06/2023   MCV 90.8 08/06/2023   PLT 196 08/06/2023    Radiographic Findings: DG Chest Port 1  View  Result Date: 10/07/2023 CLINICAL DATA:  Abdominal pain, rule out perforation status post EUS EXAM: PORTABLE CHEST 1 VIEW PORTABLE ABDOMEN 1 VIEW COMPARISON:  None Available. FINDINGS: The heart size and mediastinal contours are within normal limits. Mild diffuse interstitial opacity. Nonobstructive pattern of bowel gas with gas and stool present to the rectum. Large burden of stool. No free air in the abdomen. No acute osseous findings. IMPRESSION: 1. Mild diffuse interstitial opacity, consistent with edema or atypical/viral infection. No focal airspace opacity. 2. Nonobstructive pattern of bowel gas with gas and stool present to the rectum. Large burden of stool. No free air in the abdomen. Electronically Signed   By: Jearld Lesch M.D.   On: 10/07/2023 17:31   DG Abd Portable 2V  Result Date: 10/07/2023 CLINICAL DATA:  Abdominal pain, rule out perforation status post EUS EXAM: PORTABLE CHEST 1 VIEW PORTABLE ABDOMEN 1 VIEW COMPARISON:  None Available. FINDINGS: The heart size and mediastinal contours are within normal limits. Mild diffuse interstitial opacity. Nonobstructive pattern of bowel gas with gas and stool present to the rectum. Large burden of stool. No free air in the abdomen. No acute osseous findings. IMPRESSION: 1. Mild diffuse interstitial opacity, consistent with edema or atypical/viral infection. No focal airspace opacity. 2. Nonobstructive pattern of bowel gas with gas and stool present to the rectum. Large burden of stool. No free air in the abdomen. Electronically Signed   By: Jearld Lesch M.D.   On: 10/07/2023 17:31   CT PANCREAS ABDOMEN W WO CONTRAST  Result Date: 09/19/2023 CLINICAL DATA:  Pancreatic abnormality on PET EXAM: CT ABDOMEN WITHOUT AND WITH CONTRAST TECHNIQUE: Multidetector CT imaging of the abdomen was performed following the standard protocol before and following the bolus administration of intravenous contrast. RADIATION DOSE REDUCTION: This exam was  performed according to the departmental dose-optimization program which includes automated exposure control, adjustment of the mA and/or kV according to patient size and/or use of iterative reconstruction technique. CONTRAST:  OMNIPAQUE IOHEXOL 300 MG/ML  SOLN COMPARISON:  PET-CT dated 06/10/2023 FINDINGS: Lower chest: Lung bases are clear. Hepatobiliary: Liver is within normal limits. Layering small gallstones, without associated inflammatory changes. No intrahepatic or extrahepatic duct dilatation. Pancreas: Atrophy in the pancreatic body/tail. Main pancreatic ductal dilatation measuring up to 10 mm (series 4/image 42), with abrupt cut off in the pancreatic head. Associated 3.2 x 2.5 cm pancreatic head mass (series 4/image 46), highly suspicious for pancreatic adenocarcinoma. Spleen: Within normal limits. Adrenals/Urinary Tract: Adrenal glands normal limits. Kidneys are within normal limits. No hydronephrosis. Stomach/Bowel: Stomach is within normal limits. Visualized bowel is grossly unremarkable. Vascular/Lymphatic: No evidence of abdominal aortic aneurysm. Atherosclerotic calcifications of the abdominal aorta and branch vessels, although vessels remain patent. Tumor abuts the SMV without definite invasion. Other: No abdominal ascites Musculoskeletal: Mild degenerative changes of the visualized thoracolumbar spine. IMPRESSION: 3.2 cm pancreatic head mass, consistent with pancreatic adenocarcinoma. No evidence of metastatic disease. Cholelithiasis, without associated inflammatory changes. Electronically Signed   By: Roselie Awkward.D.  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 Presented with right upper lobe hypermetabolic lung mass as well as an additional suspicious right lower lobe lung nodule that was not hypermetabolic on PET imaging. 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  ***  Plan:  ***   *** minutes of total time was spent for this patient encounter, including preparation, face-to-face counseling with the patient and coordination of care, physical exam, and documentation of the encounter. ____________________________________  Billie Lade, PhD, MD  This document serves as a record of services personally performed by Antony Blackbird, MD. It was created on his behalf by Neena Rhymes, a trained medical scribe. The creation of this record is based on the scribe's personal observations and the provider's statements to them. This document has been checked and approved by the attending provider.

## 2023-10-09 NOTE — Progress Notes (Signed)
Patient Name: Tiffany [name], Tiffany [name] MRN: [id number] Date of Birth: 20-Nov-1946 Referring Physician: Si Gaul, M.D. Date of Service: 2023-09-10 Radiation Oncologist: Arnette Schaumann, M.D. [facility] [place]     RADIATION ONCOLOGY END OF TREATMENT NOTE     Diagnosis: C34.11 Malignant neoplasm of upper lobe, right bronchus or lung Staging on 2023-08-06: Adenocarcinoma of upper lobe of right lung (HCC) T=cT2a, N=cN0, M=cM0 Intent: Curative     ==========DELIVERED PLANS==========  First Treatment Date: 2023-09-04 - Last Treatment Date: 2023-09-09   Plan Name: Lung_R_SBRT Site: Lung, Right Technique: SBRT/SRT-IMRT Mode: Photon Dose Per Fraction: 18 Gy Prescribed Dose (Delivered / Prescribed): 54 Gy / 54 Gy Prescribed Fxs (Delivered / Prescribed): 3 / 3  Narrative: She tolerated radiation therapy well other than mild fatigue.      ==========ON TREATMENT VISIT DATES========== 2023-09-04, 2023-09-06, 2023-09-09, 2023-09-09  Plan: The patient will return for routine follow-up in one month.   -----------------------------------  Billie Lade, PhD, MD  This document serves as a record of services personally performed by Antony Blackbird, MD. It was created on his behalf by Neena Rhymes, a trained medical scribe. The creation of this record is based on the scribe's personal observations and the provider's statements to them. This document has been checked and approved by the attending provider.

## 2023-10-10 ENCOUNTER — Ambulatory Visit
Admission: RE | Admit: 2023-10-10 | Discharge: 2023-10-10 | Disposition: A | Discharge status: Home / Self Care | Payer: Medicare Other | Source: Ambulatory Visit | Attending: Radiation Oncology | Admitting: Radiation Oncology

## 2023-10-10 VITALS — BP 135/59 | HR 81 | Temp 97.7°F | Resp 18 | Ht 65.0 in | Wt 140.6 lb

## 2023-10-10 DIAGNOSIS — C3411 Malignant neoplasm of upper lobe, right bronchus or lung: Secondary | ICD-10-CM | POA: Insufficient documentation

## 2023-10-10 DIAGNOSIS — Z923 Personal history of irradiation: Secondary | ICD-10-CM | POA: Diagnosis not present

## 2023-10-10 DIAGNOSIS — R5383 Other fatigue: Secondary | ICD-10-CM | POA: Diagnosis not present

## 2023-10-10 HISTORY — DX: Personal history of irradiation: Z92.3

## 2023-10-10 NOTE — Progress Notes (Signed)
Tiffany [name] is here today for follow up post radiation to the lung.  Lung Side: Right  Does the patient complain of any of the following: Pain:She denies pain Shortness of breath w/wo exertion: No Cough: She reports coughing intermittently. Hemoptysis: No Pain with swallowing: No Swallowing/choking concerns: No Appetite: Fair Energy Level: Fair Post radiation skin Changes: No     BP (!) 135/59 (BP Location: Right Arm, Patient Position: Sitting, Cuff Size: Normal)   Pulse 81   Temp 97.7 F (36.5 C)   Resp 18   Ht 5\' 5"  (1.651 m)   Wt 140 lb 9.6 oz (63.8 kg)   SpO2 99%   BMI 23.40 kg/m

## 2023-10-14 ENCOUNTER — Telehealth: Payer: Self-pay | Admitting: Genetic Counselor

## 2023-10-14 ENCOUNTER — Ambulatory Visit: Payer: Self-pay | Admitting: Genetic Counselor

## 2023-10-14 ENCOUNTER — Encounter: Payer: Self-pay | Admitting: Genetic Counselor

## 2023-10-14 DIAGNOSIS — Z1379 Encounter for other screening for genetic and chromosomal anomalies: Secondary | ICD-10-CM | POA: Insufficient documentation

## 2023-10-14 NOTE — Telephone Encounter (Signed)
Revealed negative genetic testing.  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 It could be due to a different gene that we are not testing, or maybe our current technology may not be able to pick something up.  It will be important for her to keep in contact with genetics to keep up with whether additional testing may be needed.      

## 2023-10-14 NOTE — Progress Notes (Addendum)
HPI:  Tiffany [name] was previously seen in the [facility] due to a personal and family history of cancer and concerns regarding a hereditary predisposition to cancer. Please refer to our prior cancer genetics clinic note for more information regarding our discussion, assessment and recommendations, at the time. Tiffany [name] recent genetic test results were disclosed to her, as were recommendations warranted by these results. These results and recommendations are discussed in more detail below.  CANCER HISTORY:  Oncology History  Adenocarcinoma of upper lobe of right lung (HCC)  08/06/2023 Initial Diagnosis   Adenocarcinoma of upper lobe of right lung (HCC)   08/06/2023 Cancer Staging   Staging form: Lung, AJCC 8th Edition - Clinical: Stage IB (cT2a, cN0, cM0) - Signed by Si Gaul, MD on 08/06/2023   10/14/2023 Genetic Testing   Negative genetic testing on the CancerNext-Expanded+RNAinsight.  The report date is 10/13/2023.  The CancerNext-Expanded gene panel offered by [employer] and includes sequencing and rearrangement analysis for the following 71 genes: AIP, ALK, APC, ATM, BAP1, BARD1, BMPR1A, BRCA1, BRCA2, BRIP1, CDC73, CDH1, CDK4, CDKN1B, CDKN2A, CHEK2, DICER1, FH, FLCN, KIF1B, LZTR1, MAX, MEN1, MET, MLH1, MSH2, MSH6, MUTYH, NF1, NF2, NTHL1, PALB2, PHOX2B, PMS2, POT1, PRKAR1A, PTCH1, PTEN, RAD51C, RAD51D, RB1, RET, SDHA, SDHAF2, SDHB, SDHC, SDHD, SMAD4, SMARCA4, SMARCB1, SMARCE1, STK11, SUFU, TMEM127, TP53, TSC1, TSC2 and VHL (sequencing and deletion/duplication); AXIN2, CTNNA1, EGFR, EGLN1, HOXB13, KIT, MITF, MSH3, PDGFRA, POLD1 and POLE (sequencing only); EPCAM and GREM1 (deletion/duplication only). RNA data is routinely analyzed for use in variant interpretation for all genes.      FAMILY HISTORY:  We obtained a detailed, 4-generation family history.  Significant diagnoses are listed below: Family History  Problem Relation Age of Onset   Stroke Mother     Cerebral aneurysm Mother    Hypertension Father    Suicidality Father    Diabetes Maternal Aunt    Diabetes Maternal Aunt    Breast cancer Maternal Aunt    Diabetes Maternal Aunt    Breast cancer Maternal Aunt    Throat cancer Maternal Uncle    Lung cancer Maternal Uncle    Colon cancer Neg Hx    Ovarian cancer Neg Hx    Endometrial cancer Neg Hx    Pancreatic cancer Neg Hx        The patient has one son and one grandson who are cancer free.  She is an only child.  Her parents are deceased.   The patient's father had one brother who is cancer free.  There is no other cancer diagnosis on this side of the family.    The patient's mother has four sisters and two brothers.  Both sisters had breast cancer.  There is no other cancer on this side of the family.   Tiffany [name] is unaware of previous family history of genetic testing for hereditary cancer risks.  There is no reported Ashkenazi Jewish ancestry. There is no known consanguinity  GENETIC TEST RESULTS: Genetic testing reported out on October 13, 2023 through the CancerNext-Expanded+RNAinsight cancer panel found no pathogenic mutations. The CancerNext-Expanded gene panel offered by [employer] and includes sequencing and rearrangement analysis for the following 71 genes: AIP, ALK, APC, ATM, BAP1, BARD1, BMPR1A, BRCA1, BRCA2, BRIP1, CDC73, CDH1, CDK4, CDKN1B, CDKN2A, CHEK2, DICER1, FH, FLCN, KIF1B, LZTR1, MAX, MEN1, MET, MLH1, MSH2, MSH6, MUTYH, NF1, NF2, NTHL1, PALB2, PHOX2B, PMS2, POT1, PRKAR1A, PTCH1, PTEN, RAD51C, RAD51D, RB1, RET, SDHA, SDHAF2, SDHB, SDHC, SDHD, SMAD4, SMARCA4, SMARCB1, SMARCE1, STK11,  SUFU, TMEM127, TP53, TSC1, TSC2 and VHL (sequencing and deletion/duplication); AXIN2, CTNNA1, EGFR, EGLN1, HOXB13, KIT, MITF, MSH3, PDGFRA, POLD1 and POLE (sequencing only); EPCAM and GREM1 (deletion/duplication only). RNA data is routinely analyzed for use in variant interpretation for all genes. The test report has been scanned into  EPIC and is located under the Molecular Pathology section of the Results Review tab.  A portion of the result report is included below for reference.     We discussed with Tiffany [name] that because current genetic testing is not perfect, it is possible there may be a gene mutation in one of these genes that current testing cannot detect, but that chance is small.  We also discussed, that there could be another gene that has not yet been discovered, or that we have not yet tested, that is responsible for the cancer diagnoses in the family. It is also possible there is a hereditary cause for the cancer in the family that Tiffany [name] did not inherit and therefore was not identified in her testing.  Therefore, it is important to remain in touch with cancer genetics in the future so that we can continue to offer Tiffany [name] the most up to date genetic testing.   ADDITIONAL GENETIC TESTING: We discussed with Tiffany [name] that her genetic testing was fairly extensive.  If there are genes identified to increase cancer risk that can be analyzed in the future, we would be happy to discuss and coordinate this testing at that time.    CANCER SCREENING RECOMMENDATIONS: Tiffany [name] test result is considered negative (normal).  This means that we have not identified a hereditary cause for her personal and family history of cancer at this time. Most cancers happen by chance and this negative test suggests that her personal and family history of cancer may fall into this category.    Possible reasons for Tiffany [name]'s negative genetic test include:  1. There may be a gene mutation in one of these genes that current testing methods cannot detect but that chance is small.  2. There could be another gene that has not yet been discovered, or that we have not yet tested, that is responsible for the cancer diagnoses in the family.  3.  There may be no hereditary risk for cancer in the family. The cancers in Tiffany [name] and/or her family may be  sporadic/familial or due to other genetic and environmental factors. 4. It is also possible there is a hereditary cause for the cancer in the family that Tiffany [name] did not inherit.  Therefore, it is recommended she continue to follow the cancer management and screening guidelines provided by her oncology and primary healthcare provider. An individual's cancer risk and medical management are not determined by genetic test results alone. Overall cancer risk assessment incorporates additional factors, including personal medical history, family history, and any available genetic information that may result in a personalized plan for cancer prevention and surveillance   RECOMMENDATIONS FOR FAMILY MEMBERS:  Individuals in this family might be at some increased risk of developing cancer, over the general population risk, simply due to the family history of cancer.  We recommended women in this family have a yearly mammogram beginning at age 30, or 73 years younger than the earliest onset of cancer, an annual clinical breast exam, and perform monthly breast self-exams. Women in this family should also have a gynecological exam as recommended by their primary provider. All family members should be referred for colonoscopy starting at  age 55, or 10 years younger than the earliest onset of cancer.  FOLLOW-UP: Lastly, we discussed with Tiffany [name] that cancer genetics is a rapidly advancing field and it is possible that new genetic tests will be appropriate for her and/or her family members in the future. We encouraged her to remain in contact with cancer genetics on an annual basis so we can update her personal and family histories and let her know of advances in cancer genetics that may benefit this family.   Our contact number was provided. Tiffany [name] questions were answered to her satisfaction, and she knows she is welcome to call us at anytime with additional questions or concerns.   Maylon Cos, MS,  [facility] Licensed, Certified Genetic Counselor [email or web address]

## 2023-10-18 ENCOUNTER — Telehealth: Payer: Self-pay | Admitting: Nurse Practitioner

## 2023-10-18 ENCOUNTER — Other Ambulatory Visit: Payer: Self-pay | Admitting: Radiation Oncology

## 2023-10-18 DIAGNOSIS — C250 Malignant neoplasm of head of pancreas: Secondary | ICD-10-CM

## 2023-10-18 NOTE — Telephone Encounter (Signed)
Spoke with patient confirming upcoming appointment  

## 2023-10-24 NOTE — Progress Notes (Cosign Needed)
[facility]  [phone]   HEMATOLOGY ONCOLOGY CONSULTATION   Tiffany [name]  DOB: [birth year]  MR#: [id number]  CSN#: [id number]     Patient Care Team: Joselyn Arrow, MD as PCP - General (Family Medicine) Luciano Cutter, MD as Consulting Physician (Pulmonary Disease) Antonietta Barcelona, OD as Referring Physician (Optometry) Tora Duck PA-C (Physician Assistant)  Reason for consult: pancreatic mass   History of present illness: Lung cancer screening done 04/29/2023 showed multiple lesions.  The first was so new, part solid masslike lesion in the posterior right upper lobe.  PET scan done 06/10/2023, showed hypermetabolic mass in the right upper lobe which was consistent with primary bronchogenic carcinoma.  There is a second space, partially solid, partially cystic, 12.4 cm pelvic mass was found during PET scan.  Finally, a hypermetabolic soft tissue mass found in pancreatic head and descending duodenum.  This was concerning for pancreatic head carcinoma or focal pancreatitis.  She did go for radiation treatment for mass of right upper lung lobe.  She has had surgical removal of pelvic mass.  Was recommended to have adjuvant chemotherapy.  Prior to next treatment, she underwent EGD/EUS on 10/07/2023 due to concern for pancreatic mass.  EUS showed an irregular habits lesion on pancreatic head measuring 3.5 x 2.5 cm.  There is intact interface between the lesion and superior mesenteric artery, celiac trunk, and portal vein.  This indicates lack of invasion.  Lesion is noted where bile duct and pancreatic duct insert and dilate.  This was staged as T2 N0 MX by endosonographic criteria.  CA 19.9 was normal at 33.  She did have Ambry genetics testing which was negative for clinically significant variants.  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  Patient states that Dr. Roselind Messier, radiology oncologist, said that surgery was not a likely option.   Chemotherapy and radiation were also mentioned as possibilities. Patient has finished radiation treatment for lung cancer.  She states she finished about 1 month ago.  Reports some pain along the right flank area.  Otherwise tolerated this fine.  She denies abdominal pain.  She states she does have some intermittent upper abdominal fullness and tenderness.  She reports her appetite as fair.  She has lost about 20 pounds since May. She.denies chest pain, chest pressure, or shortness of breath. She denies headaches or visual disturbances. She nausea, vomiting, or changes in bowel or bladder habits.   She lives with her husband.  Their adult son remains lives with them intermittently.  She was a heavy smoker.  Quit smoking in 2019.  She does have history of COPD as a result.  She drinks very intermittently and socially only.  Denies history of heavy drinking.  Denies drug use or abuse.  MEDICAL HISTORY:  Past Medical History:  Diagnosis Date   Abnormal PFT 09/02/2014   moderate restrictive ventilatory defect.   Arthritis    hip   BCC (basal cell carcinoma), face 2010   Dr. Terri Piedra   Colon polyp 12/2003   Diverticulosis 11/2010   seen on colonoscopy and air contrast BE   Emphysema of lung (HCC)    Family history of breast cancer    History of radiation therapy    Right Lung-09/04/23-09/09/23- Dr. Antony Blackbird   Hyperlipidemia    Hypertension    previously treated with meds, resolved   MGUS (monoclonal gammopathy of unknown significance) 1999   prevously under care of Dr. 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   Tobacco abuse    quit in 2019    SURGICAL HISTORY: Past Surgical History:  Procedure Laterality Date   BASAL CELL CARCINOMA EXCISION  11/26/2008   L face, R temple   BILATERAL  SALPINGOOPHORECTOMY  11/26/2005   cancerous tumor R ovary   BIOPSY  10/07/2023   Procedure: BIOPSY;  Surgeon: Lemar Lofty., MD;  Location: [facility] ENDOSCOPY;  Service: Gastroenterology;;   BRONCHIAL BIOPSY  07/09/2023   Procedure: BRONCHIAL BIOPSIES;  Surgeon: Leslye Peer, MD;  Location: [facility] ENDOSCOPY;  Service: Pulmonary;;   BRONCHIAL BRUSHINGS  07/09/2023   Procedure: BRONCHIAL BRUSHINGS;  Surgeon: Leslye Peer, MD;  Location: [facility] ENDOSCOPY;  Service: Pulmonary;;   BRONCHIAL NEEDLE ASPIRATION BIOPSY  07/09/2023   Procedure: BRONCHIAL NEEDLE ASPIRATION BIOPSIES;  Surgeon: Leslye Peer, MD;  Location: [facility] ENDOSCOPY;  Service: Pulmonary;;   CATARACT EXTRACTION  11/27/2007   bilateral   COLONOSCOPY  11/26/2010   Dr. Laural Benes. Diverticulosis. BE also done--normal   ESOPHAGOGASTRODUODENOSCOPY (EGD) WITH PROPOFOL N/A 10/07/2023   Procedure: ESOPHAGOGASTRODUODENOSCOPY (EGD) WITH PROPOFOL;  Surgeon: Meridee Score Netty Starring., MD;  Location: [facility] ENDOSCOPY;  Service: Gastroenterology;  Laterality: N/A;   EUS N/A 10/07/2023   Procedure: UPPER ENDOSCOPIC ULTRASOUND (EUS) RADIAL;  Surgeon: Lemar Lofty., MD;  Location: [facility] ENDOSCOPY;  Service: Gastroenterology;  Laterality: N/A;   FIDUCIAL MARKER PLACEMENT  07/09/2023   Procedure: FIDUCIAL MARKER PLACEMENT;  Surgeon: Leslye Peer, MD;  Location: [facility] ENDOSCOPY;  Service: Pulmonary;;   FINE NEEDLE ASPIRATION N/A 10/07/2023   Procedure: FINE NEEDLE ASPIRATION (FNA) LINEAR;  Surgeon: Lemar Lofty., MD;  Location: [facility] ENDOSCOPY;  Service: Gastroenterology;  Laterality: N/A;   OMENTECTOMY  11/26/2005   and pelvic lymphadenectomy (with BSO)   ROBOTIC ASSISTED LAPAROSCOPIC LYSIS OF ADHESION N/A 07/18/2023   Procedure: ROBOTIC ASSISTED PELVIC MASS EXCISION WITH RADICAL DISSECTION, MINI LAPAROTOMY, CYSTOSCOPY;  Surgeon: Carver Fila, MD;  Location: [facility] ORS;  Service: Gynecology;  Laterality: N/A;   SKIN CANCER EXCISION Left 07/2021    neck; pt can't recall type of cancer   TONSILLECTOMY  age 17   VAGINAL HYSTERECTOMY  late 20's   "didn't want any more children"    SOCIAL HISTORY: Social History   Socioeconomic History   Marital status: Married    Spouse name: Not on file   Number of children: 1   Years of education: Not on file   Highest education level: Not on file  Occupational History   Occupation: retired Warehouse manager company)    Employer: RETIRED  Tobacco Use   Smoking status: Former    Current packs/day: 0.00    Average packs/day: 2.0 packs/day for 59.8 years (119.6 ttl pk-yrs)    Types: Cigarettes    Start date: 57    Quit date: 09/10/2018    Years since quitting: 5.1    Passive exposure: Past (quit in 2019)   Smokeless tobacco: Never  Vaping Use   Vaping status: Some Days  Substance and Sexual Activity   Alcohol use: Yes    Comment: rarely   Drug use: No   Sexual activity: Not Currently    Partners: Male  Other Topics Concern   Not on file  Social History Narrative   Lives with husband, 1 cat.  Son lives with him during the week, stays with his girlfriend on the weekends (divorced).   1 grandson.  Expecting a great-grandbaby      Updated 04/2023   Social Determinants of Health   Financial Resource Strain: Not on file  Food Insecurity: No Food Insecurity (08/14/2023)   Hunger Vital Sign    Worried About Running Out of Food in the Last Year: Never true    Ran Out of Food in the Last Year: Never true  Transportation Needs: No Transportation Needs (08/14/2023)   PRAPARE - Administrator, Civil Service (Medical): No    Lack of Transportation (Non-Medical): No  Physical Activity: Not on file  Stress: Not on file  Social Connections: Not on file  Intimate Partner Violence: Not At Risk (08/14/2023)   Humiliation, Afraid, Rape, and Kick questionnaire    Fear of Current or Ex-Partner: No    Emotionally Abused: No    Physically Abused: No    Sexually Abused: No    FAMILY  HISTORY: Family History  Problem Relation Age of Onset   Stroke Mother    Cerebral aneurysm Mother    Hypertension Father    Suicidality Father    Diabetes Maternal Aunt    Diabetes Maternal Aunt    Breast cancer Maternal Aunt    Diabetes Maternal Aunt    Breast cancer Maternal Aunt    Throat cancer Maternal Uncle    Lung cancer Maternal Uncle    Colon cancer Neg Hx    Ovarian cancer Neg Hx    Endometrial cancer Neg Hx    Pancreatic cancer Neg Hx     ALLERGIES:  has No Known Allergies.  MEDICATIONS:  Current Outpatient Medications  Medication Sig Dispense Refill   acetaminophen (TYLENOL) 500 MG tablet Take 2 tablets (1,000 mg total) by mouth every 8 (eight) hours as needed for mild pain (pain score 1-3). May use Tylenol for next 1-3 days if needed for abdominal pain, but otherwise call office if any issues arise     albuterol (VENTOLIN HFA) 108 (90 Base) MCG/ACT inhaler Inhale 2 puffs into the lungs every 6 (six) hours as needed for wheezing or shortness of breath. 8 g 1   cholecalciferol (VITAMIN D3) 25 MCG (1000 UNIT) tablet Take 1,000 Units by mouth daily.     Coenzyme Q10 (COQ10) 100 MG CAPS Take 100 mg by mouth daily.     losartan (COZAAR) 50 MG tablet Take 1 tablet (50 mg total) by mouth daily. 90 tablet 3   Tiotropium Bromide-Olodaterol (STIOLTO RESPIMAT) 2.5-2.5 MCG/ACT AERS INHALE 2 PUFFS BY MOUTH ONCE DAILY 4 g 11   rosuvastatin (CRESTOR) 10 MG tablet Take 1 tablet (10 mg total) by mouth daily. (Patient taking differently: Take 10 mg by mouth at bedtime.) 90 tablet 3   No current facility-administered medications for this visit.    REVIEW OF SYSTEMS:   Constitutional: Denies fevers, chills or abnormal night sweats. Appetite fair.  Reports a 20 pound weight loss since May 2024. Eyes: Denies blurriness of vision, double vision or watery eyes Ears, nose, mouth, throat, and face: Denies mucositis or sore throat Respiratory: Denies cough, dyspnea or  wheezes Cardiovascular: Denies palpitation, chest discomfort or lower extremity swelling Gastrointestinal:  Denies nausea, heartburn or change in bowel habits.  Has some intermittent upper abdominal pain. Skin: Denies abnormal skin rashes Lymphatics: Denies new lymphadenopathy or easy bruising Neurological:Denies numbness, tingling or  new weaknesses Behavioral/Psych: Mood is stable, no new changes  All other systems were reviewed with the patient and are negative.  PHYSICAL EXAMINATION: ECOG PERFORMANCE STATUS: 1 - Symptomatic but completely ambulatory  Vitals:   10/25/23 1028  BP: 130/62  Pulse: 77  Resp: 16  Temp: 97.6 F (36.4 C)  SpO2: 97%   Filed Weights   10/25/23 1028  Weight: 141 lb 3.2 oz (64 kg)   Wt Readings from Last 3 Encounters:  10/25/23 141 lb 3.2 oz (64 kg)  10/10/23 140 lb 9.6 oz (63.8 kg)  10/07/23 139 lb 12.4 oz (63.4 kg)    GENERAL:alert, no distress and comfortable SKIN: skin color, texture, turgor are normal, no rashes or significant lesions EYES: normal, conjunctiva are pink and non-injected, sclera clear OROPHARYNX:no exudate, no erythema and lips, buccal mucosa, and tongue normal  NECK: supple, thyroid normal size, non-tender, without nodularity LYMPH:  no palpable lymphadenopathy in the cervical, axillary or inguinal LUNGS: clear to auscultation and percussion with normal breathing effort HEART: regular rate & rhythm and no murmurs and no lower extremity edema ABDOMEN:abdomen soft with normal bowel sounds.  There is mild abdominal tenderness with palpation of epigastric and right upper quadrant of the belly.  No masses or organomegaly appreciated today. Musculoskeletal:no cyanosis of digits and no clubbing  PSYCH: alert & oriented x 3 with fluent speech NEURO: no focal motor/sensory deficits  LABORATORY DATA:  I have reviewed the data as listed Lab Results  Component Value Date   WBC 5.1 10/25/2023   HGB 12.5 10/25/2023   HCT 38.2 10/25/2023    MCV 92.0 10/25/2023   PLT 160 10/25/2023   Recent Labs    07/23/23 1242 08/06/23 1409 09/24/23 1518 10/25/23 1144  NA 136 138 139 138  K 4.1 3.9 4.3 4.4  CL 100 101 99 102  CO2 28 31 30 29   GLUCOSE 122* 117* 113* 105*  BUN 13 10 14 15   CREATININE 0.56 0.71 0.79 0.66  CALCIUM 9.4 9.5 10.1 9.4  GFRNONAA >60 >60  --  >60  PROT  --  7.3 7.7 7.2  ALBUMIN  --  3.9 4.4 4.1  AST  --  16 18 16   ALT  --  13 15 12   ALKPHOS  --  95 95 85  BILITOT  --  0.4 0.4 0.5    RADIOGRAPHIC STUDIES: DG Chest Port 1 View  Result Date: 10/07/2023 CLINICAL DATA:  Abdominal pain, rule out perforation status post EUS EXAM: PORTABLE CHEST 1 VIEW PORTABLE ABDOMEN 1 VIEW COMPARISON:  None Available. FINDINGS: The heart size and mediastinal contours are within normal limits. Mild diffuse interstitial opacity. Nonobstructive pattern of bowel gas with gas and stool present to the rectum. Large burden of stool. No free air in the abdomen. No acute osseous findings. IMPRESSION: 1. Mild diffuse interstitial opacity, consistent with edema or atypical/viral infection. No focal airspace opacity. 2. Nonobstructive pattern of bowel gas with gas and stool present to the rectum. Large burden of stool. No free air in the abdomen. Electronically Signed   By: Jearld Lesch M.D.   On: 10/07/2023 17:31   DG Abd Portable 2V  Result Date: 10/07/2023 CLINICAL DATA:  Abdominal pain, rule out perforation status post EUS EXAM: PORTABLE CHEST 1 VIEW PORTABLE ABDOMEN 1 VIEW COMPARISON:  None Available. FINDINGS: The heart size and mediastinal contours are within normal limits. Mild diffuse interstitial opacity. Nonobstructive pattern of bowel gas with gas and stool present to the rectum. Large burden  of stool. No free air in the abdomen. No acute osseous findings. IMPRESSION: 1. Mild diffuse interstitial opacity, consistent with edema or atypical/viral infection. No focal airspace opacity. 2. Nonobstructive pattern of bowel gas with  gas and stool present to the rectum. Large burden of stool. No free air in the abdomen. Electronically Signed   By: Jearld Lesch M.D.   On: 10/07/2023 17:31    ASSESSMENT & PLAN:   Malignant neoplasm of head of pancreas [employer]) Assessment & Plan: Reviewed recent progress notes and imaging studies leading to today's visit  -Lung cancer screening 04/2023 showed suspicious lung mass  -PET scan 05/2023 showed hypermetabolic mass in right upper lobe of the lungs, in the pelvis, and in head of pancreas.  -lung cancer treated with radiation per Dr. Roselind Messier. She reports completing radiation about 1 month ago. -Surgical removal of pelvic mass.  Adjuvant chemotherapy has been recommended. -EUS done 10/07/2023 showing pancreatic head mass.  Biopsy positive for pancreatic adenocarcinoma. -Staged as T2 N0 MX per endosonographic endosonographic criteria endosonographic criteria. -CA 19.9 in October 2024 was normal. -[facility]ry genetics evaluation was negative for clinically significant genetic variants. -Will get new CT chest abdomen pelvis with contrast for further evaluation and staging of pancreatic adenocarcinoma. -Check CBC, CMP, and CA 19.9 today. -Will obtain immunostain of pancreatic tumor biopsy. -Refer to surgery, Dr. Freida Busman or Dr. Donell Beers of the experience to discuss Whipple procedure.    -Present in tumor board for multidisciplinary discussion. -Follow-up with patient in 1 to 2 weeks.  Orders: -     Ambulatory referral to General Surgery -     CBC with Differential (Cancer Center Only); Future -     CMP (Cancer Center only); Future -     Cancer antigen 19-9; Future -     CT CHEST ABDOMEN PELVIS W CONTRAST; Future    Long discussion with patient and her husband about likely treatment recommendations.  She understands that without surgery procedure is considered curative in early stages of pancreatic cancer.  Discussed chemotherapy needed recommended prior to and/for after surgery to prevent recurrence  and metastases.  It is possible radiation may be recommended, however this would be following surgery or chemotherapy.  Explained to her that radiation is not as effective as surgery with or without chemotherapy.  All questions were answered. The patient knows to call the clinic with any problems, questions or concerns.      Carlean Jews, NP 10/25/2023 1:09 PM  Addendum I have seen the patient, examined her. I agree with the assessment and and plan and have edited the notes.   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  Her GYN oncologist Dr. Pricilla Holm recommended adjuvant chemotherapy for granulosa cell tumor, but she was not very interested in it has been held due to the recently diagnosed pancreatic cancer.  Initial staging scan was negative for distant metastasis, but will reach out to pathologist to see if they can compare her lung biopsy and pancreatic biopsy, to rule out metastatic pancreatic cancer to lung.  I recommend repeating staging CT scan, and I will refer her to pancreatobiliary surgeon Dr. Donell Beers or Dr. Freida Busman.  Her pancreatic cancer appears to be resectable based on CT and EUS.  I discussed that the overall aggressive nature and high risk of recurrence after  surgery, and I recommend adjuvant or neoadjuvant chemotherapy to reduce her risk of recurrence.  She is elderly with multiple comorbidities, including COPD, she is not a very interested in extensive surgery or chemotherapy, we discussed alternative of radiation if she does not want surgery.  All questions were answered.  Plan to see her back in 2 to 3 weeks to finalize her treatment plan.   Malachy Mood MD 10/25/2023

## 2023-10-25 ENCOUNTER — Inpatient Hospital Stay: Payer: Medicare Other | Admitting: Nurse Practitioner

## 2023-10-25 ENCOUNTER — Inpatient Hospital Stay: Payer: Medicare Other | Attending: Gynecologic Oncology

## 2023-10-25 VITALS — BP 130/62 | HR 77 | Temp 97.6°F | Resp 16 | Ht 65.0 in | Wt 141.2 lb

## 2023-10-25 DIAGNOSIS — Z860100 Personal history of colon polyps, unspecified: Secondary | ICD-10-CM

## 2023-10-25 DIAGNOSIS — Z923 Personal history of irradiation: Secondary | ICD-10-CM

## 2023-10-25 DIAGNOSIS — R194 Change in bowel habit: Secondary | ICD-10-CM | POA: Insufficient documentation

## 2023-10-25 DIAGNOSIS — Z8249 Family history of ischemic heart disease and other diseases of the circulatory system: Secondary | ICD-10-CM | POA: Insufficient documentation

## 2023-10-25 DIAGNOSIS — Z85828 Personal history of other malignant neoplasm of skin: Secondary | ICD-10-CM

## 2023-10-25 DIAGNOSIS — R112 Nausea with vomiting, unspecified: Secondary | ICD-10-CM | POA: Diagnosis not present

## 2023-10-25 DIAGNOSIS — Z90710 Acquired absence of both cervix and uterus: Secondary | ICD-10-CM

## 2023-10-25 DIAGNOSIS — C250 Malignant neoplasm of head of pancreas: Secondary | ICD-10-CM | POA: Insufficient documentation

## 2023-10-25 DIAGNOSIS — R109 Unspecified abdominal pain: Secondary | ICD-10-CM | POA: Diagnosis not present

## 2023-10-25 DIAGNOSIS — Z808 Family history of malignant neoplasm of other organs or systems: Secondary | ICD-10-CM

## 2023-10-25 DIAGNOSIS — Z90722 Acquired absence of ovaries, bilateral: Secondary | ICD-10-CM | POA: Insufficient documentation

## 2023-10-25 DIAGNOSIS — I10 Essential (primary) hypertension: Secondary | ICD-10-CM

## 2023-10-25 DIAGNOSIS — Z801 Family history of malignant neoplasm of trachea, bronchus and lung: Secondary | ICD-10-CM | POA: Diagnosis not present

## 2023-10-25 DIAGNOSIS — Z823 Family history of stroke: Secondary | ICD-10-CM | POA: Insufficient documentation

## 2023-10-25 DIAGNOSIS — Z818 Family history of other mental and behavioral disorders: Secondary | ICD-10-CM

## 2023-10-25 DIAGNOSIS — C3411 Malignant neoplasm of upper lobe, right bronchus or lung: Secondary | ICD-10-CM

## 2023-10-25 DIAGNOSIS — Z79899 Other long term (current) drug therapy: Secondary | ICD-10-CM | POA: Diagnosis not present

## 2023-10-25 DIAGNOSIS — Z87891 Personal history of nicotine dependence: Secondary | ICD-10-CM | POA: Diagnosis not present

## 2023-10-25 DIAGNOSIS — Z833 Family history of diabetes mellitus: Secondary | ICD-10-CM | POA: Diagnosis not present

## 2023-10-25 DIAGNOSIS — M8580 Other specified disorders of bone density and structure, unspecified site: Secondary | ICD-10-CM | POA: Insufficient documentation

## 2023-10-25 DIAGNOSIS — Z803 Family history of malignant neoplasm of breast: Secondary | ICD-10-CM | POA: Diagnosis not present

## 2023-10-25 DIAGNOSIS — C259 Malignant neoplasm of pancreas, unspecified: Secondary | ICD-10-CM | POA: Insufficient documentation

## 2023-10-25 LAB — CMP (CANCER CENTER ONLY)
ALT: 12 U/L (ref 0–44)
AST: 16 U/L (ref 15–41)
Albumin: 4.1 g/dL (ref 3.5–5.0)
Alkaline Phosphatase: 85 U/L (ref 38–126)
Anion gap: 7 (ref 5–15)
BUN: 15 mg/dL (ref 8–23)
CO2: 29 mmol/L (ref 22–32)
Calcium: 9.4 mg/dL (ref 8.9–10.3)
Chloride: 102 mmol/L (ref 98–111)
Creatinine: 0.66 mg/dL (ref 0.44–1.00)
GFR, Estimated: >60 mL/min (ref >60)
Glucose, Bld: 105 mg/dL — ABNORMAL HIGH (ref 70–99)
Potassium: 4.4 mmol/L (ref 3.5–5.1)
Sodium: 138 mmol/L (ref 135–145)
Total Bilirubin: 0.5 mg/dL (ref <1.2)
Total Protein: 7.2 g/dL (ref 6.5–8.1)

## 2023-10-25 LAB — CBC WITH DIFFERENTIAL (CANCER CENTER ONLY)
Abs Immature Granulocytes: 0.01 10*3/uL (ref 0.00–0.07)
Basophils Absolute: 0 10*3/uL (ref 0.0–0.1)
Basophils Relative: 1 %
Eosinophils Absolute: 0.1 10*3/uL (ref 0.0–0.5)
Eosinophils Relative: 2 %
HCT: 38.2 % (ref 36.0–46.0)
Hemoglobin: 12.5 g/dL (ref 12.0–15.0)
Immature Granulocytes: 0 %
Lymphocytes Relative: 19 %
Lymphs Abs: 1 10*3/uL (ref 0.7–4.0)
MCH: 30.1 pg (ref 26.0–34.0)
MCHC: 32.7 g/dL (ref 30.0–36.0)
MCV: 92 fL (ref 80.0–100.0)
Monocytes Absolute: 0.6 10*3/uL (ref 0.1–1.0)
Monocytes Relative: 12 %
Neutro Abs: 3.4 10*3/uL (ref 1.7–7.7)
Neutrophils Relative %: 66 %
Platelet Count: 160 10*3/uL (ref 150–400)
RBC: 4.15 MIL/uL (ref 3.87–5.11)
RDW: 12.6 % (ref 11.5–15.5)
WBC Count: 5.1 10*3/uL (ref 4.0–10.5)
nRBC: 0 % (ref 0.0–0.2)

## 2023-10-25 NOTE — Assessment & Plan Note (Signed)
Reviewed recent progress notes and imaging studies leading to today's visit  -Lung cancer screening 04/2023 showed suspicious lung mass  -PET scan 05/2023 showed hypermetabolic mass in right upper lobe of the lungs, in the pelvis, and in head of pancreas.  -lung cancer treated with radiation per Dr. Roselind Messier. She reports completing radiation about 1 month ago. -Surgical removal of pelvic mass.  Adjuvant chemotherapy has been recommended. -EUS done 10/07/2023 showing pancreatic head mass.  Biopsy positive for pancreatic adenocarcinoma. -Staged as T2 N0 MX per endosonographic endosonographic criteria endosonographic criteria. -CA 19.9 in October 2024 was normal. -Ambry genetics evaluation was negative for clinically significant genetic variants. -Will get new CT chest abdomen pelvis with contrast for further evaluation and staging of pancreatic adenocarcinoma. -Check CBC, CMP, and CA 19.9 today. -Will obtain immunostain of pancreatic tumor biopsy. -Refer to surgery, Dr. Freida Busman or Dr. Donell Beers of the experience to discuss Whipple procedure.    -Present in tumor board for multidisciplinary discussion. -Follow-up with patient in 1 to 2 weeks.

## 2023-10-25 NOTE — Assessment & Plan Note (Deleted)
Reviewed recent progress notes and imaging studies leading to today's visit  -Lung cancer screening 04/2023 showed suspicious lung mass  -PET scan 05/2023 showed hypermetabolic mass in right upper lobe of the lungs, in the pelvis, and in head of pancreas.  -lung cancer treated with radiation per Dr. Roselind Messier. She reports completing radiation about 1 month ago. -Surgical removal of pelvic mass.  Adjuvant chemotherapy has been recommended. -EUS done 10/07/2023 showing pancreatic head mass.  Biopsy positive for pancreatic adenocarcinoma. -Staged as T2 N0 MX per endosonographic endosonographic criteria endosonographic criteria. -CA 19.9 in October 2024 was normal. -Ambry genetics evaluation was negative for clinically significant genetic variants. -Will get new CT chest abdomen pelvis with contrast for further evaluation and staging of pancreatic adenocarcinoma. -Check CBC, CMP, and CA 19.9 today. -Will obtain immunostain of pancreatic tumor biopsy. -Refer to surgery, Dr. Freida Busman or Dr. Donell Beers of the experience to discuss Whipple procedure.    -Present in tumor board for multidisciplinary discussion. -Follow-up with patient in 1 to 2 weeks.

## 2023-10-25 NOTE — Progress Notes (Signed)
Normal CBC and CMP. Waiting for results of Ca 19.9

## 2023-10-26 LAB — CANCER ANTIGEN 19-9: CA 19-9: 35 U/mL (ref 0–35)

## 2023-10-28 ENCOUNTER — Other Ambulatory Visit: Payer: Self-pay

## 2023-10-30 ENCOUNTER — Other Ambulatory Visit: Payer: Self-pay | Admitting: *Deleted

## 2023-10-30 NOTE — Progress Notes (Signed)
The proposed treatment discussed in conference is for discussion purpose only and is not a binding recommendation.  The patients have not been physically examined, or presented with their treatment options.  Therefore, final treatment plans cannot be decided.  

## 2023-10-31 LAB — CYTOLOGY - NON PAP

## 2023-11-01 ENCOUNTER — Ambulatory Visit (HOSPITAL_COMMUNITY)
Admission: RE | Admit: 2023-11-01 | Discharge: 2023-11-01 | Disposition: A | Discharge status: Home / Self Care | Payer: Medicare Other | Source: Ambulatory Visit | Attending: Nurse Practitioner | Admitting: Nurse Practitioner

## 2023-11-01 DIAGNOSIS — C250 Malignant neoplasm of head of pancreas: Secondary | ICD-10-CM | POA: Diagnosis not present

## 2023-11-01 DIAGNOSIS — C259 Malignant neoplasm of pancreas, unspecified: Secondary | ICD-10-CM | POA: Diagnosis not present

## 2023-11-01 DIAGNOSIS — J439 Emphysema, unspecified: Secondary | ICD-10-CM | POA: Diagnosis not present

## 2023-11-01 DIAGNOSIS — K8020 Calculus of gallbladder without cholecystitis without obstruction: Secondary | ICD-10-CM | POA: Diagnosis not present

## 2023-11-01 DIAGNOSIS — K5730 Diverticulosis of large intestine without perforation or abscess without bleeding: Secondary | ICD-10-CM | POA: Diagnosis not present

## 2023-11-01 MED ORDER — IOHEXOL 300 MG/ML  SOLN
100.0000 mL | Freq: Once | INTRAMUSCULAR | Status: AC | PRN
Start: 2023-11-01 — End: 2023-11-01
  Administered 2023-11-01: 100 mL via INTRAVENOUS

## 2023-11-08 ENCOUNTER — Other Ambulatory Visit: Payer: Self-pay | Admitting: General Surgery

## 2023-11-08 DIAGNOSIS — C3491 Malignant neoplasm of unspecified part of right bronchus or lung: Secondary | ICD-10-CM | POA: Diagnosis not present

## 2023-11-08 DIAGNOSIS — D472 Monoclonal gammopathy: Secondary | ICD-10-CM | POA: Diagnosis not present

## 2023-11-08 DIAGNOSIS — C569 Malignant neoplasm of unspecified ovary: Secondary | ICD-10-CM | POA: Diagnosis not present

## 2023-11-08 DIAGNOSIS — C250 Malignant neoplasm of head of pancreas: Secondary | ICD-10-CM | POA: Diagnosis not present

## 2023-11-11 ENCOUNTER — Other Ambulatory Visit: Payer: Self-pay

## 2023-11-11 ENCOUNTER — Other Ambulatory Visit: Payer: Self-pay | Admitting: General Surgery

## 2023-11-11 DIAGNOSIS — C250 Malignant neoplasm of head of pancreas: Secondary | ICD-10-CM

## 2023-11-12 ENCOUNTER — Encounter: Payer: Self-pay | Admitting: Hematology

## 2023-11-12 ENCOUNTER — Inpatient Hospital Stay: Payer: Medicare Other | Admitting: Hematology

## 2023-11-12 ENCOUNTER — Inpatient Hospital Stay: Payer: Medicare Other | Attending: Gynecologic Oncology

## 2023-11-12 VITALS — BP 129/68 | HR 70 | Resp 17 | Wt 141.0 lb

## 2023-11-12 DIAGNOSIS — C250 Malignant neoplasm of head of pancreas: Secondary | ICD-10-CM

## 2023-11-12 DIAGNOSIS — R918 Other nonspecific abnormal finding of lung field: Secondary | ICD-10-CM | POA: Insufficient documentation

## 2023-11-12 DIAGNOSIS — G893 Neoplasm related pain (acute) (chronic): Secondary | ICD-10-CM | POA: Diagnosis not present

## 2023-11-12 DIAGNOSIS — C3411 Malignant neoplasm of upper lobe, right bronchus or lung: Secondary | ICD-10-CM | POA: Diagnosis not present

## 2023-11-12 DIAGNOSIS — Z8543 Personal history of malignant neoplasm of ovary: Secondary | ICD-10-CM | POA: Diagnosis not present

## 2023-11-12 DIAGNOSIS — Z90722 Acquired absence of ovaries, bilateral: Secondary | ICD-10-CM | POA: Diagnosis not present

## 2023-11-12 DIAGNOSIS — Z923 Personal history of irradiation: Secondary | ICD-10-CM | POA: Diagnosis not present

## 2023-11-12 LAB — CBC WITH DIFFERENTIAL (CANCER CENTER ONLY)
Abs Immature Granulocytes: 0.01 10*3/uL (ref 0.00–0.07)
Basophils Absolute: 0 10*3/uL (ref 0.0–0.1)
Basophils Relative: 1 %
Eosinophils Absolute: 0.1 10*3/uL (ref 0.0–0.5)
Eosinophils Relative: 2 %
HCT: 40.3 % (ref 36.0–46.0)
Hemoglobin: 13.1 g/dL (ref 12.0–15.0)
Immature Granulocytes: 0 %
Lymphocytes Relative: 18 %
Lymphs Abs: 0.9 10*3/uL (ref 0.7–4.0)
MCH: 30.2 pg (ref 26.0–34.0)
MCHC: 32.5 g/dL (ref 30.0–36.0)
MCV: 92.9 fL (ref 80.0–100.0)
Monocytes Absolute: 0.5 10*3/uL (ref 0.1–1.0)
Monocytes Relative: 9 %
Neutro Abs: 3.7 10*3/uL (ref 1.7–7.7)
Neutrophils Relative %: 70 %
Platelet Count: 168 10*3/uL (ref 150–400)
RBC: 4.34 MIL/uL (ref 3.87–5.11)
RDW: 12.6 % (ref 11.5–15.5)
WBC Count: 5.2 10*3/uL (ref 4.0–10.5)
nRBC: 0 % (ref 0.0–0.2)

## 2023-11-12 LAB — CMP (CANCER CENTER ONLY)
ALT: 13 U/L (ref 0–44)
AST: 18 U/L (ref 15–41)
Albumin: 4.2 g/dL (ref 3.5–5.0)
Alkaline Phosphatase: 90 U/L (ref 38–126)
Anion gap: 7 (ref 5–15)
BUN: 13 mg/dL (ref 8–23)
CO2: 32 mmol/L (ref 22–32)
Calcium: 9.6 mg/dL (ref 8.9–10.3)
Chloride: 101 mmol/L (ref 98–111)
Creatinine: 0.73 mg/dL (ref 0.44–1.00)
GFR, Estimated: >60 mL/min (ref >60)
Glucose, Bld: 106 mg/dL — ABNORMAL HIGH (ref 70–99)
Potassium: 3.8 mmol/L (ref 3.5–5.1)
Sodium: 140 mmol/L (ref 135–145)
Total Bilirubin: 0.6 mg/dL (ref <1.2)
Total Protein: 7.1 g/dL (ref 6.5–8.1)

## 2023-11-12 MED ORDER — PROCHLORPERAZINE MALEATE 10 MG PO TABS: 10.0000 mg | ORAL_TABLET | Freq: Four times a day (QID) | ORAL | 1 refills | Status: DC | PRN

## 2023-11-12 MED ORDER — ONDANSETRON HCL 8 MG PO TABS: 8.0000 mg | ORAL_TABLET | Freq: Three times a day (TID) | ORAL | 1 refills | Status: AC | PRN

## 2023-11-12 MED ORDER — LIDOCAINE-PRILOCAINE 2.5-2.5 % EX CREA: TOPICAL_CREAM | CUTANEOUS | 3 refills | Status: DC

## 2023-11-12 NOTE — Assessment & Plan Note (Signed)
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  -PET scan from June 10, 2023 showed a hypermetabolic 3.1 cm mass in the right upper lobe, and hypermetabolic soft tissue fullness in the pancreatic head, and partially solid and cystic 12.4 cm pelvic mass. 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 Pricilla Holm  -baseline CA19.9 33 -Pt has seen by pancreatobiliary surgeon Dr. Donell Beers, who recommends neoadjuvant chemotherapy due to the uncertainty of lung metastasis.  Patient declined surgery. -Repeated CT on November 01, 2023 showed improved treated lung mass, but several new or enlarging lung nodules, concerning for metastatic disease.  -Patient is agreeable with chemo, plan to start gemcitabine and Abraxane after the holiday.

## 2023-11-12 NOTE — Progress Notes (Signed)
[facility]   Telephone:([phone]) [phone] Fax:([phone]   Clinic Follow up Note   Patient Care Team: Joselyn Arrow, MD as PCP - General (Family Medicine) Luciano Cutter, MD as Consulting Physician (Pulmonary Disease) Antonietta Barcelona, OD as Referring Physician (Optometry) Clyda Hurdle (Physician Assistant) Malachy Mood, MD as Consulting Physician (Hematology and Oncology) Carlean Jews, NP as Nurse Practitioner (Hematology and Oncology)  Date of Service:  11/12/2023  CHIEF COMPLAINT: f/u of pancreatic cancer  CURRENT THERAPY:  Pending chemotherapy gemcitabine and Abraxane  Oncology History   Pancreatic cancer (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  -PET scan from June 10, 2023 showed a hypermetabolic 3.1 cm mass in the right upper lobe, and hypermetabolic soft tissue fullness in the pancreatic head, and partially solid and cystic 12.4 cm pelvic mass. 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 Pricilla Holm  -baseline CA19.9 33 -Pt has seen by pancreatobiliary surgeon Dr. Donell Beers, who recommends neoadjuvant chemotherapy due to the uncertainty of lung metastasis.  Patient declined surgery. -Repeated CT on November 01, 2023 showed improved treated lung mass, but several new or enlarging lung nodules, concerning for metastatic disease.  -Patient is agreeable with chemo, plan to start gemcitabine and Abraxane after the holiday.   Assessment and Plan    Pancreatic Cancer Follow-up for pancreatic cancer. Declined surgical intervention due to concerns about recovery time, potential diabetes, and infection risk. Repeated CT scan shows stable pancreatic tumor size, but worsening lung nodules, concerning for metastatic disease.  -Discussed the  aggressive nature and high metastasis risk. Explained chemotherapy is palliative, not curative. Discussed gemcitabine and Abraxane regimen. Patient prefers to start treatment after Christmas. - Schedule chemotherapy class - Arrange port placement with Dr. Karsten Ro - Start chemotherapy after Christmas - Repeat CT scan in three months to assess response  Lung cancer vs lung metastasis  She had a biopsy-confirmed adenocarcinoma in right lung, status post SBRT treatment.  On the most recent CT scan, she has multiple lung nodules with increased in size. Concern for metastasis from pancreatic cancer.  -will ask path to check IHC on lung and pancreatic bx if they still have enough tissue - chemotherapy for pancreatic cancer may address lung nodules. Nodule near aorta is concerning but will be monitored. - Follow-up CT scan in three months  Recurrent granulosa cell tumor  -s/p surgical resection in 06/2023 -Dr. Pricilla Holm recommended adjuvant chemo but pt declined   Plan -I reviewed her CT scan from November 01, 2023, concerning for lung metastasis -I recommend chemotherapy with gemcitabine and Abraxane every 2 weeks due to her age and medical comorbidities - Office to call with port placement appointment by Dr. Donell Beers -Plan to start chemotherapy after Christmas      SUMMARY OF ONCOLOGIC HISTORY: Oncology History  Adenocarcinoma of upper lobe of right lung (HCC)  08/06/2023 Initial Diagnosis   Adenocarcinoma of upper lobe of right lung (HCC)   08/06/2023 Cancer Staging   Staging form: Lung, AJCC 8th Edition - Clinical: Stage IB (cT2a, cN0, cM0) - Signed by Si Gaul, MD on 08/06/2023   10/14/2023 Genetic Testing   Negative genetic testing on the CancerNext-Expanded+RNAinsight.  The report date is 10/13/2023.  The CancerNext-Expanded gene panel offered by [employer] and includes sequencing and rearrangement analysis for  the following 71 genes: AIP, ALK, APC, ATM, BAP1, BARD1, BMPR1A,  BRCA1, BRCA2, BRIP1, CDC73, CDH1, CDK4, CDKN1B, CDKN2A, CHEK2, DICER1, FH, FLCN, KIF1B, LZTR1, MAX, MEN1, MET, MLH1, MSH2, MSH6, MUTYH, NF1, NF2, NTHL1, PALB2, PHOX2B, PMS2, POT1, PRKAR1A, PTCH1, PTEN, RAD51C, RAD51D, RB1, RET, SDHA, SDHAF2, SDHB, SDHC, SDHD, SMAD4, SMARCA4, SMARCB1, SMARCE1, STK11, SUFU, TMEM127, TP53, TSC1, TSC2 and VHL (sequencing and deletion/duplication); AXIN2, CTNNA1, EGFR, EGLN1, HOXB13, KIT, MITF, MSH3, PDGFRA, POLD1 and POLE (sequencing only); EPCAM and GREM1 (deletion/duplication only). RNA data is routinely analyzed for use in variant interpretation for all genes.    Pancreatic cancer (HCC)  10/07/2023 Cancer Staging   Staging form: Exocrine Pancreas, AJCC 8th Edition - Clinical stage from 10/07/2023: Stage IB (cT2, cN0, cM0) - Signed by Malachy Mood, MD on 10/25/2023 Total positive nodes: 0   10/25/2023 Initial Diagnosis   Pancreatic cancer [facility])      Discussed the use of AI scribe software for clinical note transcription with the patient, who gave verbal consent to proceed.  History of Present Illness   A 77 year old patient with a history of pancreatic and lung cancer presents for a follow-up visit. The patient also expresses concern about the fragility of the pancreas tissue.  The patient's lung cancer has been treated with radiation, and she reports that her appetite has improved slightly. She has also had three doses of radiation for a nodule in her lung. A recent CT scan shows that this nodule has decreased in size from 4 cm to 3.2 cm. However, the scan also reveals multiple other nodules in the lung, one of which has increased in size, raising concerns about metastatic disease.  In addition to her cancers, the patient has a pelvic tumor. She is also open to having a port inserted for the chemotherapy infusions, as she reports having "lousy veins. ".         All other systems were reviewed with the patient and are negative.  MEDICAL HISTORY:  Past Medical  History:  Diagnosis Date   Abnormal PFT 09/02/2014   moderate restrictive ventilatory defect.   Arthritis    hip   BCC (basal cell carcinoma), face 2010   Dr. Terri Piedra   Colon polyp 12/2003   Diverticulosis 11/2010   seen on colonoscopy and air contrast BE   Emphysema of lung (HCC)    Family history of breast cancer    History of radiation therapy    Right Lung-09/04/23-09/09/23- Dr. Antony Blackbird   Hyperlipidemia    Hypertension    previously treated with meds, resolved   MGUS (monoclonal gammopathy of unknown significance) 1999   prevously under care of Dr. 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   Tobacco abuse    quit in 2019    SURGICAL HISTORY: Past Surgical History:  Procedure Laterality Date   BASAL CELL CARCINOMA EXCISION  11/26/2008   L face, R temple   BILATERAL SALPINGOOPHORECTOMY  11/26/2005   cancerous tumor R ovary   BIOPSY  10/07/2023   Procedure: BIOPSY;  Surgeon: Lemar Lofty., MD;  Location: [facility];  Service: Gastroenterology;;   BRONCHIAL BIOPSY  07/09/2023   Procedure: BRONCHIAL BIOPSIES;  Surgeon: Leslye Peer, MD;  Location: [facility] ENDOSCOPY;  Service: Pulmonary;;   BRONCHIAL BRUSHINGS  07/09/2023   Procedure: BRONCHIAL BRUSHINGS;  Surgeon: Leslye Peer, MD;  Location: [facility] ENDOSCOPY;  Service: Pulmonary;;   BRONCHIAL NEEDLE ASPIRATION BIOPSY  07/09/2023   Procedure: BRONCHIAL NEEDLE ASPIRATION BIOPSIES;  Surgeon: Leslye Peer, MD;  Location[facility];  Service: Pulmonary;;   CATARACT EXTRACTION  11/27/2007   bilateral   COLONOSCOPY  11/26/2010   Dr. Laural Benes. Diverticulosis. BE also done--normal   ESOPHAGOGASTRODUODENOSCOPY (EGD) WITH PROPOFOL N/A 10/07/2023   Procedure: ESOPHAGOGASTRODUODENOSCOPY (EGD) WITH PROPOFOL;  Surgeon:  Meridee Score Netty Starring., MD;  Location: [facility];  Service: Gastroenterology;  Laterality: N/A;   EUS N/A 10/07/2023   Procedure: UPPER ENDOSCOPIC ULTRASOUND (EUS) RADIAL;  Surgeon: Lemar Lofty., MD;  Location: [facility] ENDOSCOPY;  Service: Gastroenterology;  Laterality: N/A;   FIDUCIAL MARKER PLACEMENT  07/09/2023   Procedure: FIDUCIAL MARKER PLACEMENT;  Surgeon: Leslye Peer, MD;  Location: [facility] ENDOSCOPY;  Service: Pulmonary;;   FINE NEEDLE ASPIRATION N/A 10/07/2023   Procedure: FINE NEEDLE ASPIRATION (FNA) LINEAR;  Surgeon: Lemar Lofty., MD;  Location: [facility];  Service: Gastroenterology;  Laterality: N/A;   OMENTECTOMY  11/26/2005   and pelvic lymphadenectomy (with BSO)   ROBOTIC ASSISTED LAPAROSCOPIC LYSIS OF ADHESION N/A 07/18/2023   Procedure: ROBOTIC ASSISTED PELVIC MASS EXCISION WITH RADICAL DISSECTION, MINI LAPAROTOMY, CYSTOSCOPY;  Surgeon: Carver Fila, MD;  Location: [facility] ORS;  Service: Gynecology;  Laterality: N/A;   SKIN CANCER EXCISION Left 07/2021   neck; pt can't recall type of cancer   TONSILLECTOMY  age 59   VAGINAL HYSTERECTOMY  late 20's   "didn't want any more children"    I have reviewed the social history and family history with the patient and they are unchanged from previous note.  ALLERGIES:  has no known allergies.  MEDICATIONS:  Current Outpatient Medications  Medication Sig Dispense Refill   acetaminophen (TYLENOL) 500 MG tablet Take 2 tablets (1,000 mg total) by mouth every 8 (eight) hours as needed for mild pain (pain score 1-3). May use Tylenol for next 1-3 days if needed for abdominal pain, but otherwise call office if any issues arise     albuterol (VENTOLIN HFA) 108 (90 Base) MCG/ACT inhaler Inhale 2 puffs into the lungs every 6 (six) hours as needed for wheezing or shortness of breath. 8 g 1   cholecalciferol (VITAMIN D3) 25 MCG (1000 UNIT) tablet Take 1,000 Units by mouth daily.     Coenzyme Q10 (COQ10) 100 MG CAPS Take  100 mg by mouth daily.     losartan (COZAAR) 50 MG tablet Take 1 tablet (50 mg total) by mouth daily. 90 tablet 3   rosuvastatin (CRESTOR) 10 MG tablet Take 1 tablet (10 mg total) by mouth daily. (Patient taking differently: Take 10 mg by mouth at bedtime.) 90 tablet 3   Tiotropium Bromide-Olodaterol (STIOLTO RESPIMAT) 2.5-2.5 MCG/ACT AERS INHALE 2 PUFFS BY MOUTH ONCE DAILY 4 g 11   No current facility-administered medications for this visit.    PHYSICAL EXAMINATION: ECOG PERFORMANCE STATUS: 1 - Symptomatic but completely ambulatory  Vitals:   11/12/23 1347  BP: 129/68  Pulse: 70  Resp: 17  SpO2: 96%   Wt Readings from Last 3 Encounters:  11/12/23 141 lb (64 kg)  10/25/23 141 lb 3.2 oz (64 kg)  10/10/23 140 lb 9.6 oz (63.8 kg)     GENERAL:alert, no distress and comfortable SKIN: skin color, texture, turgor are normal, no rashes or significant lesions EYES: normal, Conjunctiva are  pink and non-injected, sclera clear NECK: supple, thyroid normal size, non-tender, without nodularity LYMPH:  no palpable lymphadenopathy in the cervical, axillary  LUNGS: clear to auscultation and percussion with normal breathing effort HEART: regular rate & rhythm and no murmurs and no lower extremity edema ABDOMEN:abdomen soft, non-tender and normal bowel sounds Musculoskeletal:no cyanosis of digits and no clubbing  NEURO: alert & oriented x 3 with fluent speech, no focal motor/sensory deficits    LABORATORY DATA:  I have reviewed the data as listed    Latest Ref Rng & Units 11/12/2023    1:26 PM 10/25/2023   11:44 AM 08/06/2023    2:09 PM  CBC  WBC 4.0 - 10.5 K/uL 5.2  5.1  5.3   Hemoglobin 12.0 - 15.0 g/dL 84.6  96.2  95.2   Hematocrit 36.0 - 46.0 % 40.3  38.2  38.7   Platelets 150 - 400 K/uL 168  160  196         Latest Ref Rng & Units 11/12/2023    1:26 PM 10/25/2023   11:44 AM 09/24/2023    3:18 PM  CMP  Glucose 70 - 99 mg/dL 841  324  401   BUN 8 - 23 mg/dL 13  15  14     Creatinine 0.44 - 1.00 mg/dL 0.27  2.53  6.64   Sodium 135 - 145 mmol/L 140  138  139   Potassium 3.5 - 5.1 mmol/L 3.8  4.4  4.3   Chloride 98 - 111 mmol/L 101  102  99   CO2 22 - 32 mmol/L 32  29  30   Calcium 8.9 - 10.3 mg/dL 9.6  9.4  40.3   Total Protein 6.5 - 8.1 g/dL 7.1  7.2  7.7   Total Bilirubin <1.2 mg/dL 0.6  0.5  0.4   Alkaline Phos 38 - 126 U/L 90  85  95   AST 15 - 41 U/L 18  16  18    ALT 0 - 44 U/L 13  12  15        RADIOGRAPHIC STUDIES: I have personally reviewed the radiological images as listed and agreed with the findings in the report. No results found.    No orders of the defined types were placed in this encounter.  All questions were answered. The patient knows to call the clinic with any problems, questions or concerns. No barriers to learning was detected. The total time spent in the appointment was 40 minutes.     Malachy Mood, MD 11/12/2023

## 2023-11-12 NOTE — Progress Notes (Signed)
START ON PATHWAY REGIMEN - Pancreatic Adenocarcinoma     A cycle is every 28 days:     Nab-paclitaxel (protein bound)      Gemcitabine   **Always confirm dose/schedule in your pharmacy ordering system**  Patient Characteristics: Metastatic Disease, First Line, PS = 0,1, BRCA1/2 and PALB2  Mutation Absent/Unknown Therapeutic Status: Metastatic Disease Line of Therapy: First Line ECOG Performance Status: 1 BRCA1/2 Mutation Status: Awaiting Test Results PALB2 Mutation Status: Awaiting Test Results Intent of Therapy: Non-Curative / Palliative Intent, Discussed with Patient 

## 2023-11-13 LAB — CANCER ANTIGEN 19-9: CA 19-9: 31 U/mL (ref 0–35)

## 2023-11-14 ENCOUNTER — Other Ambulatory Visit: Payer: Self-pay

## 2023-11-18 ENCOUNTER — Encounter (HOSPITAL_COMMUNITY): Payer: Self-pay | Admitting: General Surgery

## 2023-11-18 NOTE — Progress Notes (Signed)
Anesthesia Chart Review: Tiffany [name]  Case: [id number] Date/Time: 11/22/23 1245   Procedure: INSERTION PORT-A-CATH   Anesthesia type: General   Pre-op diagnosis: PANCREATIC CANCER   [facility]: [facility] ROOM 02 / [facility]   Surgeons: [name], MD       DISCUSSION: Patient is a 77 year old female scheduled for the above procedure.  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                    She had a new mass-like lesion in the posterior RUL on [facility] Chest CT on 04/29/23. She had a PET scan on 06/10/23 that showed from metabolic activity in the RUL lung lesion, hypermetabolic activity in soft tissue fullness about the pancreatic head and descending duodenum, and a virtually solid, partially cystic 12.4 cm anterior pelvic mass arising from an ovary that was suspicious for mucinous carcinoma. She underwent fiberoptic bronchoscopy on 07/09/23 FNA cytology consistent with adenocarcinoma for which she received RUL radiation/SBRT per Dr. Roselind Messier 09/04/23 -09/09/23. She also had GYN-ONC follow-up with Dr. Pricilla Holm. Pelvic mass concerning for recurrent granulosa cell tumor. S/p robotic assisted radical dissection for pelvic mass, LOA, cystoscopy, mini lap for specimen delivery on 07/18/23. Pathology consistent with granulosa cell tumor, adult type with tumor necrosis. Per 08/22/23 follow-up with Dr. Pricilla Holm, "Given invasion into surrounding structures, although completely resected, I would strongly recommend adjuvant treatment. We discussed this could be chemotherapy or hormonal therapy. Given need for radiation for her lung cancer and workup of her pancreatic  lesion, she strongly desires to avoid chemotherapy. She is amenable to starting an aromatase inhibitor.." 3 month follow-up planned.  She underwent EGD/Upper EUS by Dr. Meridee Score on 10/07/23 to further evaluate her pancreatic lesion.  EGD findings included no gross lesions in the entire esophagus, 2 cm hiatal hernia, erythematous mucosa in the stomach, no gross lesions in the duodenal bulb or 1st/2nd portions of the duodenum, normal major papilla. EUS findings did identify a lesion in the pancreatic head.  Appearance was suspicious for possible malignancy. Pancreatic parenchymal abnormalities consisting of atrophy were noted in the entire pancreas.  There was no sign of significant pathology in the common bile or common hepatic ducts. There were multiple stones in the gallbladder.  No malignant appearing lymph nodes visualized in the celiac, pancreatic, and porta hepatis regions. Pancreatic head lesion FNA + adenocarcinoma.   Follow-up CT chest/abd/pelvis on 11/01/23 showed slightly diminished size of the RUL lung lesion, but significant interval increase in size of multiple bilateral lung densities concerning for any combination of metastatic and synchronous multifocal lung malignancy.  No significant change in appearance of 3.4 x 2 cm pancreatic head mass. She had general surgery evaluation by Dr. Donell Beers on 11/08/23. She recommended neoadjuvant chemotherapy given the presence of the indeterminate lung nodules and the abutment of the SMV/portal vein. She was also concerned about the overall prognosis of her lung cancer. She did discuss Whipple procedure. (Oncology note indicated "Patient declined surgery."). Patient re-evaluated by oncologist Dr. Mosetta Putt on 11/12/23. She was agreeable to start chemotherapy (gemcitabine and Abraxane) after the holiday. Port-a-cath placement scheduled for 11/22/23.  Last labs noted are from 11/12/23. Anesthesia team to evaluate on the day of surgery.  VS:  Wt Readings from  Last 3 Encounters:  11/12/23 64 kg  10/25/23 64 kg  10/10/23 63.8 kg   BP Readings from Last 3 Encounters:  11/12/23 129/68  10/25/23 130/62  10/10/23 (!) 135/59   Pulse Readings from Last 3 Encounters:  11/12/23 70  10/25/23 77  10/10/23 81     PROVIDERS: Joselyn Arrow, MD is PCP  Malachy Mood, MD is HEM-ONC. She sees Si Gaul, MD for lung cancer.  Antony Blackbird, MD is RAD-ONC Lennie Odor, MD is cardiologist Levy Pupa, MD & Luciano Cutter, MD are her pulmonologists Eugene Garnet, MD is Lennox Laity, MD is GI   LABS: Lab results as of 11/12/23 include: Lab Results  Component Value Date   WBC 5.2 11/12/2023   HGB 13.1 11/12/2023   HCT 40.3 11/12/2023   PLT 168 11/12/2023   GLUCOSE 106 (H) 11/12/2023   CHOL 140 05/08/2023   TRIG 77 05/08/2023   HDL 59 05/08/2023   LDLCALC 66 05/08/2023   ALT 13 11/12/2023   AST 18 11/12/2023   NA 140 11/12/2023   K 3.8 11/12/2023   CL 101 11/12/2023   CREATININE 0.73 11/12/2023   BUN 13 11/12/2023   CO2 32 11/12/2023   HGBA1C 6.1 (A) 10/22/2022     IMAGES: CT Chest/abd/pelvis 11/01/23: IMPRESSION: 1. Slightly diminished size of a subsolid nodule of the peripheral right upper lobe, consistent with treatment response. 2. Significant interval increase in size and solid character of multiple additional bilateral nodular opacities, including several in the right lower lobe, as well as a subsolid ground-glass opacity in the dependent left lower lobe. Findings are consistent with any combination of metastatic and synchronous multifocal lung malignancy. 3. No significant change in a hypodense, infiltrative appearing mass occupying the majority of the pancreatic head, measuring 3.4 x 2.7 cm, consistent with primary pancreatic adenocarcinoma. Severe dilatation of the pancreatic duct. 4. Mass appears to closely contact the adjacent right aspect of the superior mesenteric vein with less than 180 degrees  contact. No contact to the portal or splenic vein, nor the superior mesenteric artery and celiac axis. 5. No evidence of lymphadenopathy or metastatic disease in the abdomen or pelvis. 6. Cholelithiasis. 7. Severe emphysema and diffuse bilateral bronchial wall thickening. 8. Coronary artery disease.  - Aortic Atherosclerosis (ICD10-I70.0) and Emphysema (ICD10-J43.9).   EKG: 07/09/23: Normal sinus rhythm Anteroseptal infarct , age undetermined Abnormal ECG When compared with ECG of 14-Sep-2020 20:10, PREVIOUS ECG IS PRESENT rate has slowed Confirmed by York Pellant [phone]) on 07/10/2023 11:51:19 AM   CV: Echo 01/31/22: IMPRESSIONS   1. Left ventricular ejection fraction, by estimation, is 60 to 65%. The  left ventricle has normal function. The left ventricle has no regional  wall motion abnormalities. Left ventricular diastolic parameters were  normal.   2. Right ventricular systolic function is normal. The right ventricular  size is normal.   3. The mitral valve is normal in structure. Mild mitral valve  regurgitation. No evidence of mitral stenosis. There is mild late systolic  prolapse of the anterior leaflet of the mitral valve.   4. The aortic valve is tricuspid. Aortic valve regurgitation is not  visualized. No aortic stenosis is present.   5. The inferior vena cava is normal in size with greater than 50%  respiratory variability, suggesting right atrial pressure of 3 mmHg.  - Comparison(s): Prior images unable to be directly viewed, comparison made  by report only.    CT Coronary 01/31/22: IMPRESSION:  1. Coronary calcium score of 46. This was 51st percentile for age and sex matched control. 2. Normal coronary origin with right dominance. 3. Nonobstructive CAD, with calcified plaque causing minimal (0-24%) stenosis in proximal/mid RCA, proximal LAD, and proximal LCX CAD-RADS 1. Minimal non-obstructive CAD (0-24%). Consider non-atherosclerotic causes of chest pain.  Consider preventive therapy and risk factor modification.   US Carotid 06/30/19: IMPRESSION: Color duplex indicates minimal heterogeneous and calcified plaque, with no hemodynamically significant stenosis by duplex criteria in the extracranial cerebrovascular circulation.    Past Medical History:  Diagnosis Date   Abnormal PFT 09/02/2014   moderate restrictive ventilatory defect.   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 Terri Piedra   Colon polyp 12/2003   Diverticulosis 11/2010   seen on colonoscopy and air contrast BE   Emphysema of lung (HCC)    Family history of breast cancer    History of radiation therapy    Right Lung-09/04/23-09/09/23- Dr. Antony Blackbird   Hyperlipidemia    Hypertension    previously treated with meds, resolved   MGUS (monoclonal gammopathy of unknown significance) 1999   prevously under care of Dr. 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   Tobacco abuse    quit in 2019    Past Surgical History:  Procedure Laterality Date   BASAL CELL CARCINOMA EXCISION  11/26/2008   L face, R temple   BILATERAL SALPINGOOPHORECTOMY  11/26/2005   cancerous tumor R ovary   BIOPSY  10/07/2023   Procedure: BIOPSY;  Surgeon: Lemar Lofty., MD;  Location: [facility] ENDOSCOPY;  Service: Gastroenterology;;   BRONCHIAL BIOPSY  07/09/2023   Procedure: BRONCHIAL BIOPSIES;  Surgeon: Leslye Peer, MD;  Location[facility] ENDOSCOPY;  Service: Pulmonary;;   BRONCHIAL BRUSHINGS  07/09/2023   Procedure: BRONCHIAL BRUSHINGS;  Surgeon: Leslye Peer, MD;  Location: [facility]  ENDOSCOPY;  Service: Pulmonary;;   BRONCHIAL NEEDLE ASPIRATION BIOPSY  07/09/2023   Procedure: BRONCHIAL NEEDLE ASPIRATION BIOPSIES;  Surgeon: Leslye Peer, MD;  Location: [facility] ENDOSCOPY;  Service: Pulmonary;;   CATARACT EXTRACTION  11/27/2007   bilateral   COLONOSCOPY  11/26/2010   Dr. Laural Benes. Diverticulosis. BE also done--normal   ESOPHAGOGASTRODUODENOSCOPY (EGD) WITH PROPOFOL N/A 10/07/2023   Procedure: ESOPHAGOGASTRODUODENOSCOPY (EGD) WITH PROPOFOL;  Surgeon: Meridee Score Netty Starring., MD;  Location: [facility] ENDOSCOPY;  Service: Gastroenterology;  Laterality: N/A;   EUS N/A 10/07/2023   Procedure: UPPER ENDOSCOPIC ULTRASOUND (EUS) RADIAL;  Surgeon: Lemar Lofty., MD;  Location: [facility] ENDOSCOPY;  Service: Gastroenterology;  Laterality: N/A;   FIDUCIAL MARKER PLACEMENT  07/09/2023   Procedure: FIDUCIAL MARKER PLACEMENT;  Surgeon: Leslye Peer, MD;  Location: [facility] ENDOSCOPY;  Service: Pulmonary;;   FINE NEEDLE ASPIRATION N/A 10/07/2023   Procedure: FINE NEEDLE ASPIRATION (FNA) LINEAR;  Surgeon: Lemar Lofty., MD;  Location: [facility] ENDOSCOPY;  Service: Gastroenterology;  Laterality: N/A;   OMENTECTOMY  11/26/2005   and pelvic lymphadenectomy (with BSO)   ROBOTIC ASSISTED LAPAROSCOPIC LYSIS OF ADHESION N/A 07/18/2023   Procedure: ROBOTIC ASSISTED  PELVIC MASS EXCISION WITH RADICAL DISSECTION, MINI LAPAROTOMY, CYSTOSCOPY;  Surgeon: Carver Fila, MD;  Location: [facility] ORS;  Service: Gynecology;  Laterality: N/A;   SKIN CANCER EXCISION Left 07/2021   neck; pt can't recall type of cancer   TONSILLECTOMY  age 47   VAGINAL HYSTERECTOMY  late 20's   "didn't want any more children"    MEDICATIONS: No current facility-administered medications for this encounter.    acetaminophen (TYLENOL) 500 MG tablet   albuterol (VENTOLIN HFA) 108 (90 Base) MCG/ACT inhaler   cholecalciferol (VITAMIN D3) 25 MCG (1000 UNIT) tablet   Coenzyme Q10 (COQ10) 100 MG CAPS   lidocaine-prilocaine (EMLA) cream    losartan (COZAAR) 50 MG tablet   ondansetron (ZOFRAN) 8 MG tablet   prochlorperazine (COMPAZINE) 10 MG tablet   rosuvastatin (CRESTOR) 10 MG tablet   Tiotropium Bromide-Olodaterol (STIOLTO RESPIMAT) 2.5-2.5 MCG/ACT AERS    Shonna Chock, PA-C Surgical Short Stay/Anesthesiology [facility] Phone [phone] [facility] Phone [phone] 11/18/2023 4:25 PM

## 2023-11-18 NOTE — Anesthesia Preprocedure Evaluation (Addendum)
Anesthesia Evaluation  Patient identified by MRN, date of birth, ID band Patient awake    Reviewed: Allergy & Precautions, H&P , NPO status , Patient's Chart, lab work & pertinent test results  Airway Mallampati: I  TM Distance: >3 FB Neck ROM: Full    Dental no notable dental hx. (+) Edentulous Upper, Edentulous Lower, Dental Advisory Given   Pulmonary COPD,  COPD inhaler and oxygen dependent, former smoker   Pulmonary exam normal breath sounds clear to auscultation       Cardiovascular hypertension, Pt. on medications  Rhythm:Regular Rate:Normal     Neuro/Psych negative neurological ROS  negative psych ROS   GI/Hepatic Neg liver ROS,GERD  Medicated,,  Endo/Other  negative endocrine ROS    Renal/GU negative Renal ROS  negative genitourinary   Musculoskeletal  (+) Arthritis , Osteoarthritis,    Abdominal   Peds  Hematology negative hematology ROS (+)   Anesthesia Other Findings   Reproductive/Obstetrics negative OB ROS                             Anesthesia Physical Anesthesia Plan  ASA: 3  Anesthesia Plan: General   Post-op Pain Management: Tylenol PO (pre-op)*   Induction: Intravenous  PONV Risk Score and Plan: 4 or greater and Ondansetron, Dexamethasone and Treatment may vary due to age or medical condition  Airway Management Planned: Oral ETT and LMA  Additional Equipment:   Intra-op Plan:   Post-operative Plan: Extubation in OR  Informed Consent: I have reviewed the patients History and Physical, chart, labs and discussed the procedure including the risks, benefits and alternatives for the proposed anesthesia with the patient or authorized representative who has indicated his/her understanding and acceptance.     Dental advisory given  Plan Discussed with: CRNA  Anesthesia Plan Comments: (PAT note written 11/18/2023 by Shonna Chock, PA-C.  )        Anesthesia Quick Evaluation

## 2023-11-19 ENCOUNTER — Encounter (HOSPITAL_COMMUNITY): Payer: Self-pay | Admitting: General Surgery

## 2023-11-19 NOTE — Progress Notes (Signed)
Chief Complaint  Patient presents with   Medical Management of Chronic Issues    Med check. No concerns. Declines getting tdap or shingles at local pharmacy   Patient presents for 6 month follow-up on chronic problems.  She was diagnosed with recurrent granulosa cell tumor, lung cancer and pancreatic cancer (all had been noted on PET scan from 05/2023). She had surgery 07/18/23, and last saw Dr. Pricilla Holm in September, scheduled to see her again today. They had discussed chemo and aromatose inhibitors at last visit.  She had UTI's in August and October 2024. She currently denies any urinary complaints.  10/2023 she was noted to have worsening lung nodules on CT, ?mets from pancreatic cancer vs from lung. Declined surgery (met with Dr. Donell Beers); to start chemo after Christmas, is scheduled for port placement on 12/27.  CT 11/01/23: IMPRESSION: 1. Slightly diminished size of a subsolid nodule of the peripheral right upper lobe, consistent with treatment response. 2. Significant interval increase in size and solid character of multiple additional bilateral nodular opacities, including several in the right lower lobe, as well as a subsolid ground-glass opacity in the dependent left lower lobe. Findings are consistent with any combination of metastatic and synchronous multifocal lung malignancy. 3. No significant change in a hypodense, infiltrative appearing mass occupying the majority of the pancreatic head, measuring 3.4 x 2.7 cm, consistent with primary pancreatic adenocarcinoma. Severe dilatation of the pancreatic duct. 4. Mass appears to closely contact the adjacent right aspect of the superior mesenteric vein with less than 180 degrees contact. No contact to the portal or splenic vein, nor the superior mesenteric artery and celiac axis. 5. No evidence of lymphadenopathy or metastatic disease in the abdomen or pelvis. 6. Cholelithiasis. 7. Severe emphysema and diffuse bilateral bronchial  wall thickening. 8. Coronary artery disease.  COPD: She remains under the care of Dr. Everardo All, compliant with Stiolto and albuterol prn. She uses albuterol once or twice daily with good results.  She gets short of breath with activity (ie stairs). She often takes 2 puffs prior to activity (such as coming to the doctor today, having to walk down a hill).  Hypertension. She is compliant with taking losartan 50mg . She denies side effects, headaches, chest pain, palpitations, edema, shortness of breath (other than from COPD).   BP's have been normal at doctors visits.  BP Readings from Last 3 Encounters:  11/21/23 110/70  11/12/23 129/68  10/25/23 130/62    Hyperlipidemia and aortic atherosclerosis, with calcified atherosclerotic plaque in RCA. She denies any angina. She is compliant with rosuvastatin 10mg  and denies side effects.  She has occasional foot cramps. Last lipids were at goal. Lab Results  Component Value Date   CHOL 140 05/08/2023   HDL 59 05/08/2023   LDLCALC 66 05/08/2023   TRIG 77 05/08/2023   CHOLHDL 2.4 05/08/2023     Pre-diabetes:  Last A1c was 6.1% in 09/2022 (insurance only allows checking once/year).  At that time she reported having dum-dum lollipops after meals.  She has 1 Dum-dum lollipop after dinner (started when she stopped smoking). +potatoes, spaghetti and mac and cheese occasionally, occasional biscuits.  Hasn't changed her diet in the last year. Yesterday she had sweet tea (Christmas, made it for family), but otherwise usually drinks water. She drinks apple juice or orange juice 1 cup daily with breakfast.    PMH, PSH, SH reviewed  Outpatient Encounter Medications as of 11/21/2023  Medication Sig Note   albuterol (VENTOLIN HFA) 108 (90 Base) MCG/ACT inhaler  Inhale 2 puffs into the lungs every 6 (six) hours as needed for wheezing or shortness of breath. 09/11/2023: Used this am   cholecalciferol (VITAMIN D3) 25 MCG (1000 UNIT) tablet Take 1,000 Units  by mouth daily. 11/21/2023: Last used Tuesday night   Coenzyme Q10 (COQ10) 100 MG CAPS Take 100 mg by mouth daily. 11/21/2023: Last used Tuesday night   losartan (COZAAR) 50 MG tablet Take 1 tablet (50 mg total) by mouth daily. 11/21/2023: Rochele Pages tuesday   rosuvastatin (CRESTOR) 10 MG tablet Take 1 tablet (10 mg total) by mouth daily. (Patient taking differently: Take 10 mg by mouth at bedtime.) 11/21/2023: Took Tuesday night   Tiotropium Bromide-Olodaterol (STIOLTO RESPIMAT) 2.5-2.5 MCG/ACT AERS INHALE 2 PUFFS BY MOUTH ONCE DAILY    acetaminophen (TYLENOL) 500 MG tablet Take 2 tablets (1,000 mg total) by mouth every 8 (eight) hours as needed for mild pain (pain score 1-3). May use Tylenol for next 1-3 days if needed for abdominal pain, but otherwise call office if any issues arise (Patient not taking: Reported on 11/21/2023) 11/21/2023: As needed   lidocaine-prilocaine (EMLA) cream Apply to affected area once (Patient not taking: Reported on 11/21/2023) 11/21/2023: Not using this yet   ondansetron (ZOFRAN) 8 MG tablet Take 1 tablet (8 mg total) by mouth every 8 (eight) hours as needed for nausea or vomiting. (Patient not taking: Reported on 11/21/2023)    prochlorperazine (COMPAZINE) 10 MG tablet Take 1 tablet (10 mg total) by mouth every 6 (six) hours as needed for nausea or vomiting. (Patient not taking: Reported on 11/21/2023)    No facility-administered encounter medications on file as of 11/21/2023.   No Known Allergies   ROS: No f/c, n/v, flank or abdominal pain, denies urinary symptoms. Back pain improved after PT.  Occ L LBP.  Doesn't do her HEP as regularly as she should. Occ upper stomach/epigastric pain (under the breasts), not daily. Tums helps it ease off. Breathing at baseline, occ cough, unchanged.  No significant shortness of breath. +bruising, unchanged    PHYSICAL EXAM:  BP 110/70   Pulse 63   Wt 141 lb (64 kg)   BMI 23.46 kg/m   Wt Readings from Last 3 Encounters:   11/21/23 141 lb (64 kg)  11/12/23 141 lb (64 kg)  10/25/23 141 lb 3.2 oz (64 kg)   Well-appearing, pleasant female, in good spirits HEENT: conjunctiva and sclera are clear, EOMI. OP clear Neck: no lymphadenopathy, thyromegaly or mass, no bruit Heart: regular rate and rhythm Lungs: fairly clear, some decreased air movement, unchanged Back: no CVA tenderness Abdomen: soft, nontender, no mass Extremities: no edema Skin: dry skin; purpura on forearms, lower legs (L medial shin/ankle) Psych: normal mood, affect, hygiene and grooming Neuro: alert and oriented. Ambulates with cane, normal strength  Lab Results  Component Value Date   HGBA1C 6.4 (A) 11/21/2023    ASSESSMENT/PLAN:   Essential hypertension Assessment & Plan: Well controlled, continue losartan and low sodium diet   Malignant neoplasm of head of pancreas [facility]) Assessment & Plan: Under care of oncologist. Declined surgery. Unclear if new nodules are metastatic from pancreas, or d/t lung CA. Plan is to start chemo, getting port placed tomorrow   Prediabetes Assessment & Plan: A1c increasing. Reviewed diet, encouraged her to eat more fruit/vegetables, high fiber diet, and to cut back on juice.    Orders: -     POCT glycosylated hemoglobin (Hb A1C)  Pure hypercholesterolemia Assessment & Plan: Lipids at goal per last check. Continue rosuvastatin  and low cholesterol diet   Aortic atherosclerosis (HCC) Assessment & Plan: Continue statin   Bronchogenic carcinoma (HCC)  COPD, severe (HCC) Assessment & Plan: Under care of pulm. Continue Stiolto and albuterol    F/u 6 months for CPE and AWV (separately, per insurance)

## 2023-11-19 NOTE — Progress Notes (Signed)
SDW call  Patient was given pre-op instructions over the phone. Patient verbalized understanding of instructions provided.     PCP - Dr. Joselyn Arrow Cardiologist -  Pulmonary:    PPM/ICD - denies Device Orders - na Rep Notified - na   Chest x-ray - 10/07/2023 EKG -  07/09/2023 Stress Test - ECHO - 01/31/2022 Cardiac Cath -   Sleep Study/sleep apnea/CPAP: denies  Non-diabetic  Blood Thinner Instructions: denies  Aspirin Instructions:denies   ERAS Protcol - Clears until 1000   COVID TEST- na    Anesthesia review: Yes. HTN, high cholesterol, pancreatic cancer   Patient denies shortness of breath, fever, cough and chest pain over the phone call  Your procedure is scheduled on Friday November 22, 2023  Report to [facility] Main Entrance "A" at 1030   A.M., then check in with the Admitting office.  Call this number if you have problems the morning of surgery:  [phone]   If you have any questions prior to your surgery date call [phone]: Open Monday-Friday 8am-4pm If you experience any cold or flu symptoms such as cough, fever, chills, shortness of breath, etc. between now and your scheduled surgery, please notify us at the above number    Remember:  Do not eat after midnight the night before your surgery  You may drink clear liquids until  1000   the morning of your surgery.   Clear liquids allowed are: Water, Non-Citrus Juices (without pulp), Carbonated Beverages, Clear Tea, Black Coffee ONLY (NO MILK, CREAM OR POWDERED CREAMER of any kind), and Gatorade   Take these medicines the morning of surgery with A SIP OF WATER:  Stiolto respimat inhaler  As needed: Tylenol, albuterol, zofran, compazine  As of today, STOP taking any Aspirin (unless otherwise instructed by your surgeon) Aleve, Naproxen, Ibuprofen, Motrin, Advil, Goody's, BC's, all herbal medications, fish oil, and all vitamins.

## 2023-11-21 ENCOUNTER — Telehealth: Payer: Self-pay | Admitting: Hematology

## 2023-11-21 ENCOUNTER — Ambulatory Visit: Payer: Medicare Other

## 2023-11-21 ENCOUNTER — Encounter: Payer: Self-pay | Admitting: Family Medicine

## 2023-11-21 ENCOUNTER — Other Ambulatory Visit: Payer: Medicare Other

## 2023-11-21 ENCOUNTER — Ambulatory Visit (INDEPENDENT_AMBULATORY_CARE_PROVIDER_SITE_OTHER): Payer: Medicare Other | Admitting: Family Medicine

## 2023-11-21 ENCOUNTER — Encounter: Payer: Self-pay | Admitting: Gynecologic Oncology

## 2023-11-21 ENCOUNTER — Inpatient Hospital Stay: Payer: Medicare Other | Admitting: Gynecologic Oncology

## 2023-11-21 VITALS — BP 110/70 | HR 63 | Wt 141.0 lb

## 2023-11-21 VITALS — BP 134/60 | HR 70 | Temp 97.4°F | Resp 17 | Wt 142.2 lb

## 2023-11-21 DIAGNOSIS — R918 Other nonspecific abnormal finding of lung field: Secondary | ICD-10-CM | POA: Diagnosis not present

## 2023-11-21 DIAGNOSIS — E7800 Pure hypercholesterolemia, unspecified: Secondary | ICD-10-CM

## 2023-11-21 DIAGNOSIS — Z90722 Acquired absence of ovaries, bilateral: Secondary | ICD-10-CM | POA: Diagnosis not present

## 2023-11-21 DIAGNOSIS — C250 Malignant neoplasm of head of pancreas: Secondary | ICD-10-CM

## 2023-11-21 DIAGNOSIS — I10 Essential (primary) hypertension: Secondary | ICD-10-CM

## 2023-11-21 DIAGNOSIS — Z8543 Personal history of malignant neoplasm of ovary: Secondary | ICD-10-CM | POA: Diagnosis not present

## 2023-11-21 DIAGNOSIS — C3411 Malignant neoplasm of upper lobe, right bronchus or lung: Secondary | ICD-10-CM | POA: Diagnosis not present

## 2023-11-21 DIAGNOSIS — C3490 Malignant neoplasm of unspecified part of unspecified bronchus or lung: Secondary | ICD-10-CM

## 2023-11-21 DIAGNOSIS — Z923 Personal history of irradiation: Secondary | ICD-10-CM | POA: Diagnosis not present

## 2023-11-21 DIAGNOSIS — J449 Chronic obstructive pulmonary disease, unspecified: Secondary | ICD-10-CM

## 2023-11-21 DIAGNOSIS — D3910 Neoplasm of uncertain behavior of unspecified ovary: Secondary | ICD-10-CM

## 2023-11-21 DIAGNOSIS — R7303 Prediabetes: Secondary | ICD-10-CM

## 2023-11-21 DIAGNOSIS — G893 Neoplasm related pain (acute) (chronic): Secondary | ICD-10-CM | POA: Diagnosis not present

## 2023-11-21 DIAGNOSIS — I700 Atherosclerosis of aorta: Secondary | ICD-10-CM

## 2023-11-21 LAB — POCT GLYCOSYLATED HEMOGLOBIN (HGB A1C): Hemoglobin A1C: 6.4 % — AB (ref 4.0–5.6)

## 2023-11-21 NOTE — Patient Instructions (Addendum)
Try and limit your portion sizes of rice, bread, pasta, potatoes. Limit this to 1/4 of your plate. Try and eat whole grain version (of bread, pasta, choose brown rice). Try and limit your juice intake. Eating the apple or orange is much healthier for you (you get fiber and much less sugar). Read the labels if buying juices, and keep the portions small (4 oz juice cup rather than a full glass).  Try and eat more colorful vegetables and fruits--these have a lot of fiber and healthy vitamins, and can keep the sugars down.  Try cutting back some on caffeine, tomatoes, citrus--this might help any reflux that may be contributing to your upper stomach pain.

## 2023-11-21 NOTE — Progress Notes (Signed)
Gynecologic Oncology Return Clinic Visit  11/21/23  Reason for Visit: surveillance  Treatment History: Oncology History  Adenocarcinoma of upper lobe of right lung (HCC)  08/06/2023 Initial Diagnosis   Adenocarcinoma of upper lobe of right lung (HCC)   08/06/2023 Cancer Staging   Staging form: Lung, AJCC 8th Edition - Clinical: Stage IB (cT2a, cN0, cM0) - Signed by Si Gaul, MD on 08/06/2023   10/14/2023 Genetic Testing   Negative genetic testing on the CancerNext-Expanded+RNAinsight.  The report date is 10/13/2023.  The CancerNext-Expanded gene panel offered by [employer] and includes sequencing and rearrangement analysis for the following 71 genes: AIP, ALK, APC, ATM, BAP1, BARD1, BMPR1A, BRCA1, BRCA2, BRIP1, CDC73, CDH1, CDK4, CDKN1B, CDKN2A, CHEK2, DICER1, FH, FLCN, KIF1B, LZTR1, MAX, MEN1, MET, MLH1, MSH2, MSH6, MUTYH, NF1, NF2, NTHL1, PALB2, PHOX2B, PMS2, POT1, PRKAR1A, PTCH1, PTEN, RAD51C, RAD51D, RB1, RET, SDHA, SDHAF2, SDHB, SDHC, SDHD, SMAD4, SMARCA4, SMARCB1, SMARCE1, STK11, SUFU, TMEM127, TP53, TSC1, TSC2 and VHL (sequencing and deletion/duplication); AXIN2, CTNNA1, EGFR, EGLN1, HOXB13, KIT, MITF, MSH3, PDGFRA, POLD1 and POLE (sequencing only); EPCAM and GREM1 (deletion/duplication only). RNA data is routinely analyzed for use in variant interpretation for all genes.    Pancreatic cancer (HCC)  10/07/2023 Cancer Staging   Staging form: Exocrine Pancreas, AJCC 8th Edition - Clinical stage from 10/07/2023: Stage IB (cT2, cN0, cM0) - Signed by Malachy Mood, MD on 10/25/2023 Total positive nodes: 0   10/25/2023 Initial Diagnosis   Pancreatic cancer (HCC)   11/25/2023 -  Chemotherapy   [name] : PANCREATIC Abraxane D1,8,15 + Gemcitabine D1,8,15 q28d      The patient had a recent CT scan in the setting of her COPD and lung cancer screening.  CT of the chest in early June showed a new part solid masslike lesion in the posterior segment right upper  lobe concerning for possible malignancy.  PET scan was performed on 7/15 and shows a mixed attenuation right upper lobe lung mass that is hypermetabolic, most consistent with primary bronchogenic carcinoma.  Incidental finding of a 12.4 cm partially solid, partially cystic mass in the anterior pelvis suspected to be arising from the ovary.  There is hypermetabolism cups corresponding to soft tissue fullness about the pancreatic head and descending duodenum, with differential diagnosis including pancreatic head carcinoma or focal pancreatitis.   The patient saw Dr. Delton Coombes on 7/31.  She is going to be scheduled for navigational bronchoscopy (scheduled on 8/13) to evaluate her right upper lobe mass.  Referral was sent to GI in the setting of her pancreatic hypermetabolism.   Her history is notable for stage IA granulosa cell tumor diagnosed in 2007.  At that time, she underwent ex lap, bilateral salpingo-oophorectomy, pelvic lymphadenectomy, and omentectomy with Dr. Stanford Breed.  Pathology revealed a 23 cm adult granulosa cell tumor of the left ovary with a benign fallopian tube.  Right tube and ovary were benign.  No involved pelvic lymph nodes.  Small bowel adhesions were submitted and negative for evidence of malignancy.  Inhibin levels at the time of her diagnosis were elevated to 77.  She was last seen in 2012 in our clinic and remained NED.   Inhibin B on 8/9: 1,006.3   07/18/23: Robotic-assisted radical dissection for pelvic mass, lysis of adhesions for approximately 10 minutes, cystoscopy, mini-lap for specimen delivery   Interval History: Since her last visit with me, she has been diagnosed with pancreatic cancer.  Plan to start palliative chemotherapy with gemcitabine and Abraxane.  Most recent  CT scan showed multiple increasing lung nodules with concern for metastatic disease although prior right lung biopsy consistent with primary lung adenocarcinoma.  Overall doing well.  Is trying not to get  discouraged by pancreatic cancer diagnosis.  Reports good appetite.  Denies any recent weight changes.  Has some baseline shortness of breath.  Reports back pain and some right upper abdominal pain just underneath her breast.  Denies any pelvic pain.  Denies any vaginal bleeding.  Voiding without issues.  Endorses normal bowel function.  Past Medical/Surgical History: Past Medical History:  Diagnosis Date   Abnormal PFT 09/02/2014   moderate restrictive ventilatory defect.   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 Terri Piedra   Colon polyp 12/2003   Diverticulosis 11/2010   seen on colonoscopy and air contrast BE   Emphysema of lung (HCC)    Family history of breast cancer    GERD (gastroesophageal reflux disease)    History of radiation therapy    Right Lung-09/04/23-09/09/23- Dr. Antony Blackbird   Hyperlipidemia    Hypertension    previously treated with meds, resolved   MGUS (monoclonal gammopathy of unknown significance) 1999   prevously under care of Dr. 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   Tobacco abuse    quit in 2019    Past Surgical History:  Procedure Laterality Date   BASAL CELL CARCINOMA EXCISION  11/26/2008   L face, R temple   BILATERAL SALPINGOOPHORECTOMY  11/26/2005   cancerous tumor R ovary   BIOPSY  10/07/2023   Procedure: BIOPSY;  Surgeon: Lemar Lofty., MD;  Location: [facility];  Service: Gastroenterology;;   BRONCHIAL BIOPSY  07/09/2023   Procedure: BRONCHIAL BIOPSIES;  Surgeon: Leslye Peer, MD;  Location: [facility] ENDOSCOPY;   Service: Pulmonary;;   BRONCHIAL BRUSHINGS  07/09/2023   Procedure: BRONCHIAL BRUSHINGS;  Surgeon: Leslye Peer, MD;  Location: [facility] ENDOSCOPY;  Service: Pulmonary;;   BRONCHIAL NEEDLE ASPIRATION BIOPSY  07/09/2023   Procedure: BRONCHIAL NEEDLE ASPIRATION BIOPSIES;  Surgeon: Leslye Peer, MD;  Location: [facility] ENDOSCOPY;  Service: Pulmonary;;   CATARACT EXTRACTION  11/27/2007   bilateral   COLONOSCOPY  11/26/2010   Dr. Laural Benes. Diverticulosis. BE also done--normal   ESOPHAGOGASTRODUODENOSCOPY (EGD) WITH PROPOFOL N/A 10/07/2023   Procedure: ESOPHAGOGASTRODUODENOSCOPY (EGD) WITH PROPOFOL;  Surgeon: Meridee Score Netty Starring., MD;  Location: [facility];  Service: Gastroenterology;  Laterality: N/A;   EUS N/A 10/07/2023   Procedure: UPPER ENDOSCOPIC ULTRASOUND (EUS) RADIAL;  Surgeon: Lemar Lofty., MD;  Location: [facility] ENDOSCOPY;  Service: Gastroenterology;  Laterality: N/A;   FIDUCIAL MARKER PLACEMENT  07/09/2023   Procedure: FIDUCIAL MARKER PLACEMENT;  Surgeon: Leslye Peer, MD;  Location: [facility] ENDOSCOPY;  Service: Pulmonary;;   FINE NEEDLE ASPIRATION N/A 10/07/2023   Procedure: FINE NEEDLE ASPIRATION (FNA) LINEAR;  Surgeon: Lemar Lofty., MD;  Location: [facility]OS[facility];  Service: Gastroenterology;  Laterality: N/A;   OMENTECTOMY  11/26/2005   and pelvic lymphadenectomy (with BSO)   ROBOTIC ASSISTED LAPAROSCOPIC LYSIS OF  ADHESION N/A 07/18/2023   Procedure: ROBOTIC ASSISTED PELVIC MASS EXCISION WITH RADICAL DISSECTION, MINI LAPAROTOMY, CYSTOSCOPY;  Surgeon: Carver Fila, MD;  Location: [facility] ORS;  Service: Gynecology;  Laterality: N/A;   SKIN CANCER EXCISION Left 07/2021   neck; pt can't recall type of cancer   TONSILLECTOMY  age 12   VAGINAL HYSTERECTOMY  late 20's   "didn't want any more children"    Family History  Problem Relation Age of Onset   Stroke Mother    Cerebral aneurysm Mother    Hypertension Father    Suicidality Father    Diabetes Maternal Aunt     Diabetes Maternal Aunt    Breast cancer Maternal Aunt    Diabetes Maternal Aunt    Breast cancer Maternal Aunt    Throat cancer Maternal Uncle    Lung cancer Maternal Uncle    Colon cancer Neg Hx    Ovarian cancer Neg Hx    Endometrial cancer Neg Hx    Pancreatic cancer Neg Hx     Social History   Socioeconomic History   Marital status: Married    Spouse name: Not on file   Number of children: 1   Years of education: Not on file   Highest education level: Not on file  Occupational History   Occupation: retired Warehouse manager company)    Employer: RETIRED  Tobacco Use   Smoking status: Former    Current packs/day: 0.00    Average packs/day: 2.0 packs/day for 59.8 years (119.6 ttl pk-yrs)    Types: Cigarettes    Start date: 55    Quit date: 09/10/2018    Years since quitting: 5.2    Passive exposure: Past (quit in 2019)   Smokeless tobacco: Never  Vaping Use   Vaping status: Some Days  Substance and Sexual Activity   Alcohol use: Not Currently    Comment: rarely   Drug use: No   Sexual activity: Not Currently    Partners: Male  Other Topics Concern   Not on file  Social History Narrative   Lives with husband, 1 cat.  Son lives with him during the week, stays with his girlfriend on the weekends (divorced).   1 grandson.  Expecting a great-grandbaby      Updated 04/2023   Social Drivers of Health   Financial Resource Strain: Not on file  Food Insecurity: No Food Insecurity (08/14/2023)   Hunger Vital Sign    Worried About Running Out of Food in the Last Year: Never true    Ran Out of Food in the Last Year: Never true  Transportation Needs: No Transportation Needs (08/14/2023)   PRAPARE - Administrator, Civil Service (Medical): No    Lack of Transportation (Non-Medical): No  Physical Activity: Not on file  Stress: Not on file  Social Connections: Not on file    Current Medications:  Current Outpatient Medications:    acetaminophen (TYLENOL)  500 MG tablet, Take 2 tablets (1,000 mg total) by mouth every 8 (eight) hours as needed for mild pain (pain score 1-3). May use Tylenol for next 1-3 days if needed for abdominal pain, but otherwise call office if any issues arise (Patient not taking: Reported on 11/21/2023), Disp: , Rfl:    albuterol (VENTOLIN HFA) 108 (90 Base) MCG/ACT inhaler, Inhale 2 puffs into the lungs every 6 (six) hours as needed for wheezing or shortness of breath., Disp: 8 g, Rfl: 1   cholecalciferol (VITAMIN D3) 25 MCG (1000  UNIT) tablet, Take 1,000 Units by mouth daily., Disp: , Rfl:    Coenzyme Q10 (COQ10) 100 MG CAPS, Take 100 mg by mouth daily., Disp: , Rfl:    lidocaine-prilocaine (EMLA) cream, Apply to affected area once (Patient not taking: Reported on 11/21/2023), Disp: 30 g, Rfl: 3   losartan (COZAAR) 50 MG tablet, Take 1 tablet (50 mg total) by mouth daily., Disp: 90 tablet, Rfl: 3   ondansetron (ZOFRAN) 8 MG tablet, Take 1 tablet (8 mg total) by mouth every 8 (eight) hours as needed for nausea or vomiting. (Patient not taking: Reported on 11/21/2023), Disp: 30 tablet, Rfl: 1   prochlorperazine (COMPAZINE) 10 MG tablet, Take 1 tablet (10 mg total) by mouth every 6 (six) hours as needed for nausea or vomiting. (Patient not taking: Reported on 11/21/2023), Disp: 30 tablet, Rfl: 1   Tiotropium Bromide-Olodaterol (STIOLTO RESPIMAT) 2.5-2.5 MCG/ACT AERS, INHALE 2 PUFFS BY MOUTH ONCE DAILY, Disp: 4 g, Rfl: 11   rosuvastatin (CRESTOR) 10 MG tablet, Take 1 tablet (10 mg total) by mouth daily. (Patient taking differently: Take 10 mg by mouth at bedtime.), Disp: 90 tablet, Rfl: 3  Review of Systems: Denies appetite changes, fevers, chills, fatigue, unexplained weight changes. Denies hearing loss, neck lumps or masses, mouth sores, ringing in ears or voice changes. Denies cough or wheezing.   Denies chest pain or palpitations. Denies leg swelling. Denies abdominal distention, pain, blood in stools, constipation, diarrhea,  nausea, vomiting, or early satiety. Denies pain with intercourse, dysuria, frequency, hematuria or incontinence. Denies hot flashes, pelvic pain, vaginal bleeding or vaginal discharge.   Denies joint pain or muscle pain/cramps. Denies itching, rash, or wounds. Denies dizziness, headaches, numbness or seizures. Denies swollen lymph nodes or glands, denies easy bruising or bleeding. Denies anxiety, depression, confusion, or decreased concentration.  Physical Exam: BP 134/60 (BP Location: Left Arm, Patient Position: Sitting, Cuff Size: Normal)   Pulse 70   Temp (!) 97.4 F (36.3 C)   Resp 17   Wt 142 lb 3.2 oz (64.5 kg)   SpO2 97%   BMI 23.66 kg/m  General: Alert, oriented, no acute distress. HEENT: Normocephalic, atraumatic, sclera anicteric. Chest: Mild expiratory wheezes, otherwise clear to auscultation bilaterally.   Cardiovascular: Regular rate and rhythm, no murmurs. Abdomen: soft, nontender.  Normoactive bowel sounds.  No masses or hepatosplenomegaly appreciated.  Well-healed incisions. Extremities: Grossly normal range of motion.  Warm, well perfused.  No edema bilaterally. Lymphatics: No cervical, supraclavicular, or inguinal adenopathy. GU: Normal appearing external genitalia without erythema, excoriation, or lesions.  Speculum exam reveals mildly atrophic vaginal mucosa, cuff intact, no lesions.  Bimanual exam reveals cuff intact, no masses or nodularity appreciated.    Laboratory & Radiologic Studies: CT C/A/P on 11/01/23: 1. Slightly diminished size of a subsolid nodule of the peripheral right upper lobe, consistent with treatment response. 2. Significant interval increase in size and solid character of multiple additional bilateral nodular opacities, including several in the right lower lobe, as well as a subsolid ground-glass opacity in the dependent left lower lobe. Findings are consistent with any combination of metastatic and synchronous multifocal lung malignancy. 3. No  significant change in a hypodense, infiltrative appearing mass occupying the majority of the pancreatic head, measuring 3.4 x 2.7 cm, consistent with primary pancreatic adenocarcinoma. Severe dilatation of the pancreatic duct. 4. Mass appears to closely contact the adjacent right aspect of the superior mesenteric vein with less than 180 degrees contact. No contact to the portal or splenic vein, nor the  superior mesenteric artery and celiac axis. 5. No evidence of lymphadenopathy or metastatic disease in the abdomen or pelvis. 6. Cholelithiasis. 7. Severe emphysema and diffuse bilateral bronchial wall thickening. 8. Coronary artery disease.  Assessment & Plan: Tiffany [name] is a 77 y.o. woman with recurrent granulosa cell tumor, recently diagnosed lung cancer as well as pancreatic cancer.  Patient is overall doing well from a granulosa cell tumor standpoint.  She is NED on exam today.  She is getting labs early next week.  Will plan to have her get inhibin B at the time of that lab draw.  She is scheduled to have port placed tomorrow in anticipation of starting palliative chemotherapy for her pancreatic cancer.  She had a discussion with the surgical oncologist and has opted against surgery for her pancreatic cancer.  We discussed again today the option of continued surveillance versus starting aromatase inhibitor.  She would like to start upcoming chemotherapy and reassess at her next visit with me decision to add aromatase inhibitor.  I think this is very reasonable as her pancreatic cancer carries a worse prognosis than her granulosa cell tumor.  I will see her back for follow-up in 3-4 months.  I asked her to call with any new and concerning symptoms prior to that visit.  22 minutes of total time was spent for this patient encounter, including preparation, face-to-face counseling with the patient and coordination of care, and documentation of the encounter.  Eugene Garnet, MD  Division of  Gynecologic Oncology  Department of Obstetrics and Gynecology  [facility]

## 2023-11-21 NOTE — Patient Instructions (Signed)
It was good to see you today.  I do not see or feel any evidence of cancer recurrence on your exam.  I will see you for follow-up in 3 months.  I will add the tumor marker for your granulosa cell tumor to your blood work for Monday.  As always, if you develop any new and concerning symptoms before your next visit, please call to see me sooner.

## 2023-11-22 ENCOUNTER — Encounter (HOSPITAL_COMMUNITY)
Admission: RE | Disposition: A | Discharge status: Home / Self Care | Payer: Self-pay | Source: Home / Self Care | Attending: General Surgery

## 2023-11-22 ENCOUNTER — Ambulatory Visit (HOSPITAL_COMMUNITY)
Admission: RE | Admit: 2023-11-22 | Discharge: 2023-11-22 | Disposition: A | Discharge status: Home / Self Care | Payer: Medicare Other | Attending: General Surgery | Admitting: General Surgery

## 2023-11-22 ENCOUNTER — Other Ambulatory Visit: Payer: Self-pay

## 2023-11-22 ENCOUNTER — Ambulatory Visit (HOSPITAL_COMMUNITY): Payer: Medicare Other

## 2023-11-22 ENCOUNTER — Encounter (HOSPITAL_COMMUNITY): Payer: Self-pay | Admitting: General Surgery

## 2023-11-22 ENCOUNTER — Ambulatory Visit (HOSPITAL_COMMUNITY): Payer: Medicare Other | Admitting: Vascular Surgery

## 2023-11-22 DIAGNOSIS — Z923 Personal history of irradiation: Secondary | ICD-10-CM | POA: Diagnosis not present

## 2023-11-22 DIAGNOSIS — C3491 Malignant neoplasm of unspecified part of right bronchus or lung: Secondary | ICD-10-CM | POA: Insufficient documentation

## 2023-11-22 DIAGNOSIS — Z85828 Personal history of other malignant neoplasm of skin: Secondary | ICD-10-CM | POA: Diagnosis not present

## 2023-11-22 DIAGNOSIS — J439 Emphysema, unspecified: Secondary | ICD-10-CM | POA: Insufficient documentation

## 2023-11-22 DIAGNOSIS — C250 Malignant neoplasm of head of pancreas: Secondary | ICD-10-CM | POA: Insufficient documentation

## 2023-11-22 DIAGNOSIS — C259 Malignant neoplasm of pancreas, unspecified: Secondary | ICD-10-CM

## 2023-11-22 DIAGNOSIS — C569 Malignant neoplasm of unspecified ovary: Secondary | ICD-10-CM | POA: Insufficient documentation

## 2023-11-22 DIAGNOSIS — E785 Hyperlipidemia, unspecified: Secondary | ICD-10-CM | POA: Insufficient documentation

## 2023-11-22 DIAGNOSIS — R7303 Prediabetes: Secondary | ICD-10-CM | POA: Insufficient documentation

## 2023-11-22 DIAGNOSIS — D472 Monoclonal gammopathy: Secondary | ICD-10-CM | POA: Insufficient documentation

## 2023-11-22 DIAGNOSIS — Z87891 Personal history of nicotine dependence: Secondary | ICD-10-CM | POA: Diagnosis not present

## 2023-11-22 DIAGNOSIS — Z452 Encounter for adjustment and management of vascular access device: Secondary | ICD-10-CM | POA: Insufficient documentation

## 2023-11-22 DIAGNOSIS — I10 Essential (primary) hypertension: Secondary | ICD-10-CM | POA: Diagnosis not present

## 2023-11-22 DIAGNOSIS — I2510 Atherosclerotic heart disease of native coronary artery without angina pectoris: Secondary | ICD-10-CM | POA: Diagnosis not present

## 2023-11-22 DIAGNOSIS — J449 Chronic obstructive pulmonary disease, unspecified: Secondary | ICD-10-CM | POA: Diagnosis not present

## 2023-11-22 HISTORY — PX: PORTACATH PLACEMENT: SHX2246

## 2023-11-22 HISTORY — DX: Malignant (primary) neoplasm, unspecified: C80.1

## 2023-11-22 HISTORY — DX: Gastro-esophageal reflux disease without esophagitis: K21.9

## 2023-11-22 SURGERY — INSERTION, TUNNELED CENTRAL VENOUS DEVICE, WITH PORT
Anesthesia: General

## 2023-11-22 MED ORDER — MIDAZOLAM HCL 2 MG/2ML IJ SOLN
INTRAMUSCULAR | Status: AC
  Filled 2023-11-22: qty 2

## 2023-11-22 MED ORDER — FENTANYL CITRATE (PF) 100 MCG/2ML IJ SOLN: 25.0000 ug | INTRAMUSCULAR | Status: DC | PRN

## 2023-11-22 MED ORDER — PROPOFOL 10 MG/ML IV BOLUS
INTRAVENOUS | Status: DC | PRN
  Administered 2023-11-22: 80 mg via INTRAVENOUS

## 2023-11-22 MED ORDER — LIDOCAINE 2% (20 MG/ML) 5 ML SYRINGE
INTRAMUSCULAR | Status: DC | PRN
  Administered 2023-11-22: 60 mg via INTRAVENOUS

## 2023-11-22 MED ORDER — LIDOCAINE 2% (20 MG/ML) 5 ML SYRINGE
INTRAMUSCULAR | Status: AC
  Filled 2023-11-22: qty 5

## 2023-11-22 MED ORDER — DEXAMETHASONE SODIUM PHOSPHATE 10 MG/ML IJ SOLN
INTRAMUSCULAR | Status: DC | PRN
  Administered 2023-11-22: 5 mg via INTRAVENOUS

## 2023-11-22 MED ORDER — ORAL CARE MOUTH RINSE: 15.0000 mL | Freq: Once | OROMUCOSAL | Status: AC

## 2023-11-22 MED ORDER — CHLORHEXIDINE GLUCONATE 0.12 % MT SOLN
15.0000 mL | Freq: Once | OROMUCOSAL | Status: AC
  Administered 2023-11-22: 15 mL via OROMUCOSAL
  Filled 2023-11-22: qty 15

## 2023-11-22 MED ORDER — CEFAZOLIN SODIUM-DEXTROSE 2-4 GM/100ML-% IV SOLN
2.0000 g | INTRAVENOUS | Status: AC
  Administered 2023-11-22: 2 g via INTRAVENOUS
  Filled 2023-11-22: qty 100

## 2023-11-22 MED ORDER — HEPARIN 6000 UNIT IRRIGATION SOLUTION
Status: AC
  Filled 2023-11-22: qty 500

## 2023-11-22 MED ORDER — SODIUM CHLORIDE 0.9 % IV SOLN: INTRAVENOUS | Status: DC

## 2023-11-22 MED ORDER — HEPARIN 6000 UNIT IRRIGATION SOLUTION
Status: DC | PRN
  Administered 2023-11-22: 1

## 2023-11-22 MED ORDER — FENTANYL CITRATE (PF) 250 MCG/5ML IJ SOLN
INTRAMUSCULAR | Status: DC | PRN
  Administered 2023-11-22: 25 ug via INTRAVENOUS

## 2023-11-22 MED ORDER — BUPIVACAINE-EPINEPHRINE (PF) 0.25% -1:200000 IJ SOLN
INTRAMUSCULAR | Status: AC
  Filled 2023-11-22: qty 30

## 2023-11-22 MED ORDER — DEXAMETHASONE SODIUM PHOSPHATE 10 MG/ML IJ SOLN
INTRAMUSCULAR | Status: AC
  Filled 2023-11-22: qty 1

## 2023-11-22 MED ORDER — HEPARIN SOD (PORK) LOCK FLUSH 100 UNIT/ML IV SOLN
INTRAVENOUS | Status: AC
  Filled 2023-11-22: qty 5

## 2023-11-22 MED ORDER — LIDOCAINE HCL 1 % IJ SOLN
INTRAMUSCULAR | Status: AC
  Filled 2023-11-22: qty 20

## 2023-11-22 MED ORDER — OXYCODONE HCL 5 MG PO TABS: 5.0000 mg | ORAL_TABLET | Freq: Four times a day (QID) | ORAL | 0 refills | Status: DC | PRN

## 2023-11-22 MED ORDER — FENTANYL CITRATE (PF) 250 MCG/5ML IJ SOLN
INTRAMUSCULAR | Status: AC
  Filled 2023-11-22: qty 5

## 2023-11-22 MED ORDER — HEPARIN SOD (PORK) LOCK FLUSH 100 UNIT/ML IV SOLN
INTRAVENOUS | Status: DC | PRN
  Administered 2023-11-22: 500 [IU] via INTRAVENOUS

## 2023-11-22 MED ORDER — 0.9 % SODIUM CHLORIDE (POUR BTL) OPTIME
TOPICAL | Status: DC | PRN
  Administered 2023-11-22: 1000 mL

## 2023-11-22 MED ORDER — ACETAMINOPHEN 500 MG PO TABS
1000.0000 mg | ORAL_TABLET | ORAL | Status: AC
  Administered 2023-11-22: 1000 mg via ORAL
  Filled 2023-11-22: qty 2

## 2023-11-22 MED ORDER — PROPOFOL 10 MG/ML IV BOLUS
INTRAVENOUS | Status: AC
  Filled 2023-11-22: qty 20

## 2023-11-22 MED ORDER — LIDOCAINE HCL 1 % IJ SOLN
INTRAMUSCULAR | Status: DC | PRN
  Administered 2023-11-22: 10 mL via INTRAMUSCULAR

## 2023-11-22 MED ORDER — CHLORHEXIDINE GLUCONATE CLOTH 2 % EX PADS: 6.0000 | MEDICATED_PAD | Freq: Once | CUTANEOUS | Status: DC

## 2023-11-22 MED ORDER — MIDAZOLAM HCL 2 MG/2ML IJ SOLN
INTRAMUSCULAR | Status: DC | PRN
  Administered 2023-11-22: 1 mg via INTRAVENOUS

## 2023-11-22 MED ORDER — ONDANSETRON HCL 4 MG/2ML IJ SOLN
INTRAMUSCULAR | Status: AC
  Filled 2023-11-22: qty 2

## 2023-11-22 MED ORDER — EPHEDRINE SULFATE-NACL 50-0.9 MG/10ML-% IV SOSY
PREFILLED_SYRINGE | INTRAVENOUS | Status: DC | PRN
  Administered 2023-11-22: 10 mg via INTRAVENOUS
  Administered 2023-11-22: 5 mg via INTRAVENOUS
  Administered 2023-11-22: 10 mg via INTRAVENOUS

## 2023-11-22 MED ORDER — ONDANSETRON HCL 4 MG/2ML IJ SOLN
INTRAMUSCULAR | Status: DC | PRN
  Administered 2023-11-22: 4 mg via INTRAVENOUS

## 2023-11-22 SURGICAL SUPPLY — 38 items
BAG COUNTER SPONGE SURGICOUNT (BAG) ×2 IMPLANT
BAG DECANTER FOR FLEXI CONT (MISCELLANEOUS) ×2 IMPLANT
CANISTER SUCT 3000ML PPV (MISCELLANEOUS) IMPLANT
CHLORAPREP W/TINT 26 (MISCELLANEOUS) ×2 IMPLANT
COVER SURGICAL LIGHT HANDLE (MISCELLANEOUS) ×2 IMPLANT
COVER TRANSDUCER ULTRASND GEL (DISPOSABLE) IMPLANT
DERMABOND ADVANCED .7 DNX12 (GAUZE/BANDAGES/DRESSINGS) ×2 IMPLANT
DRAPE C-ARM 42X120 X-RAY (DRAPES) ×2 IMPLANT
DRAPE CHEST BREAST 15X10 FENES (DRAPES) ×2 IMPLANT
DRAPE WARM FLUID 44X44 (DRAPES) IMPLANT
ELECT COATED BLADE 2.86 ST (ELECTRODE) ×2 IMPLANT
ELECT REM PT RETURN 9FT ADLT (ELECTROSURGICAL) ×1 IMPLANT
ELECTRODE REM PT RTRN 9FT ADLT (ELECTROSURGICAL) ×2 IMPLANT
GAUZE 4X4 16PLY ~~LOC~~+RFID DBL (SPONGE) ×2 IMPLANT
GEL ULTRASOUND 20GR AQUASONIC (MISCELLANEOUS) IMPLANT
GLOVE BIO SURGEON STRL SZ 6 (GLOVE) ×2 IMPLANT
GLOVE INDICATOR 6.5 STRL GRN (GLOVE) ×2 IMPLANT
GOWN STRL REUS W/ TWL LRG LVL3 (GOWN DISPOSABLE) ×2 IMPLANT
GOWN STRL REUS W/ TWL XL LVL3 (GOWN DISPOSABLE) ×2 IMPLANT
KIT BASIN OR (CUSTOM PROCEDURE TRAY) ×2 IMPLANT
KIT PORT POWER 8FR ISP CVUE (Port) IMPLANT
KIT TURNOVER KIT B (KITS) ×2 IMPLANT
NDL 22X1.5 STRL (OR ONLY) (MISCELLANEOUS) ×2 IMPLANT
NEEDLE 22X1.5 STRL (OR ONLY) (MISCELLANEOUS) ×1 IMPLANT
NS IRRIG 1000ML POUR BTL (IV SOLUTION) ×2 IMPLANT
PAD ARMBOARD 7.5X6 YLW CONV (MISCELLANEOUS) ×2 IMPLANT
PENCIL BUTTON HOLSTER BLD 10FT (ELECTRODE) ×2 IMPLANT
POSITIONER HEAD DONUT 9IN (MISCELLANEOUS) ×2 IMPLANT
SPIKE FLUID TRANSFER (MISCELLANEOUS) ×4 IMPLANT
SUT MON AB 4-0 PC3 18 (SUTURE) ×2 IMPLANT
SUT PROLENE 2 0 SH DA (SUTURE) ×4 IMPLANT
SUT VIC AB 3-0 SH 27X BRD (SUTURE) ×2 IMPLANT
SYR 5ML LUER SLIP (SYRINGE) ×2 IMPLANT
TOWEL GREEN STERILE (TOWEL DISPOSABLE) ×2 IMPLANT
TOWEL GREEN STERILE FF (TOWEL DISPOSABLE) ×2 IMPLANT
TRAY LAPAROSCOPIC MC (CUSTOM PROCEDURE TRAY) ×2 IMPLANT
TUBE CONNECTING 12X1/4 (SUCTIONS) IMPLANT
YANKAUER SUCT BULB TIP NO VENT (SUCTIONS) IMPLANT

## 2023-11-22 NOTE — Anesthesia Procedure Notes (Signed)
Procedure Name: LMA Insertion Date/Time: 11/22/2023 2:12 PM  Performed by: Orlin Hilding, CRNAPre-anesthesia Checklist: Patient identified, Emergency Drugs available, Suction available, Timeout performed and Patient being monitored Patient Re-evaluated:Patient Re-evaluated prior to induction Oxygen Delivery Method: Circle system utilized Preoxygenation: Pre-oxygenation with 100% oxygen Induction Type: IV induction LMA: LMA inserted LMA Size: 4.0 Number of attempts: 1 Placement Confirmation: positive ETCO2 and breath sounds checked- equal and bilateral Tube secured with: Tape

## 2023-11-22 NOTE — Assessment & Plan Note (Signed)
Under care of pulm. Continue Stiolto and albuterol

## 2023-11-22 NOTE — Assessment & Plan Note (Signed)
Lipids at goal per last check. Continue rosuvastatin and low cholesterol diet

## 2023-11-22 NOTE — Assessment & Plan Note (Signed)
Under care of oncologist. Declined surgery. Unclear if new nodules are metastatic from pancreas, or d/t lung CA. Plan is to start chemo, getting port placed tomorrow

## 2023-11-22 NOTE — Assessment & Plan Note (Signed)
Continue statin. 

## 2023-11-22 NOTE — Assessment & Plan Note (Signed)
Well controlled, continue losartan and low sodium diet

## 2023-11-22 NOTE — Op Note (Signed)
PREOPERATIVE DIAGNOSIS:  adenocarcinoma head of pancreas, cT2N0M0     POSTOPERATIVE DIAGNOSIS:  Same     PROCEDURE: left subclavian port placement, Bard ClearVue Power Port, MRI safe, 8-French.      SURGEON:  [name], MD      ANESTHESIA:  General   FINDINGS:  Good venous return, easy flush, and tip of the catheter and   SVC 24.5 cm.      SPECIMEN:  None.      ESTIMATED BLOOD LOSS:  Minimal.      COMPLICATIONS:  None known.      PROCEDURE:  Pt was identified in the holding area and taken to   the operating room, where patient was placed supine on the operating room   table.  General anesthesia was induced.  Patient's arms were tucked and the upper   chest and neck were prepped and draped in sterile fashion.  Time-out was   performed according to the surgical safety check list.  When all was   correct, we continued.   Local anesthetic was administered just under the angle of the left clavicle.  The vein was accessed with 2 pass(es) of the needle. There was good venous return and the wire passed easily with no ectopy.   Fluoroscopy was used to confirm that the wire was in the vena cava.      The patient was placed back level and the area for the pocket was anethetized   with local anesthetic.  A 3-cm transverse incision was made with a #15   blade.  Cautery was used to divide the subcutaneous tissues down to the   pectoralis muscle.  An [employer] retractor was used to elevate the skin   while a pocket was created on top of the pectoralis fascia.  The port   was placed into the pocket to confirm that it was of adequate size.  The   catheter was preattached to the port.  The port was then secured to the   pectoralis fascia with four 2-0 Prolene sutures.  These were clamped and   not tied down yet.    The catheter was tunneled through to the wire exit   site.  The catheter was placed along the wire to determine what length it should be to be in the SVC.  The catheter was cut at  24.5 cm.  The tunneler sheath and dilator were passed over the wire and the dilator and wire were removed.  The catheter was advanced through the tunneler sheath and the tunneler sheath was pulled away.  Care was taken to keep the catheter in the tunneler sheath as this occurred. This was advanced and the tunneler sheath was removed.  There was good venous return and easy flush of the catheter.  The Prolene sutures were tied down to the pectoral fascia.  The skin was reapproximated using 3-0 Vicryl interrupted deep dermal sutures.    Fluoroscopy was used to re-confirm good position of the catheter.  The skin   was then closed using 4-0 Monocryl in a subcuticular fashion.  The port was flushed with concentrated heparin flush as well.  The wounds were then cleaned, dried, and dressed with Dermabond.  The patient was awakened from anesthesia and taken to the PACU in stable condition.  Needle, sponge, and instrument counts were correct.               [name], MD

## 2023-11-22 NOTE — Interval H&P Note (Signed)
History and Physical Interval Note:  11/22/2023 1:36 PM  Tiffany [name]  has presented today for surgery, with the diagnosis of PANCREATIC CANCER.  The various methods of treatment have been discussed with the patient and family. After consideration of risks, benefits and other options for treatment, the patient has consented to  Procedure(s): INSERTION PORT-A-CATH (N/A) as a surgical intervention.  The patient's history has been reviewed, patient examined, no change in status, stable for surgery.  I have reviewed the patient's chart and labs.  Questions were answered to the patient's satisfaction.     [name]

## 2023-11-22 NOTE — Assessment & Plan Note (Signed)
A1c increasing. Reviewed diet, encouraged her to eat more fruit/vegetables, high fiber diet, and to cut back on juice.

## 2023-11-22 NOTE — H&P (Signed)
Chief Complaint  Patient presents with   Pancreatic Cancer      Referring MD: Mosetta Putt   HISTORY: Patient presents with an incidentally detected pancreatic cancer.  I am seeing her December 2024.  She was undergoing screening for lung cancer which she had been having annually for COPD and smoking history.  These have previously been negative.  Unfortunately the CT chest in June of this year demonstrated a new solid masslike lesion in the right upper lobe.  She subsequently fell and then had a PET scan.  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  The patient underwent a robotic guided bronchoscopy with positive findings.  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  Surgery for the pelvic mass was in August.  After brief recovery, she subsequently had stereotactic radiation to the lung cancer.  She now has had a workup for her pancreatic mass.  Endoscopic ultrasound cytology was positive for adenocarcinoma the pancreatic head.  There was abutment of the SMV seen on pancreatic protocol CT.  Endoscopic ultrasound did not see any invasion.  MRI, PET, CT, and EUS did not show any evidence of lymphatic disease.   Most recently, she underwent CT chest abdomen pelvis which showed no change in the pancreatic mass, but there was " significant interval increase in size and solid character of multiple additional bilateral nodular opacities including several in the right lower lobe as well as a subsolid groundglass opacity in the dependent left lower lobe.  This was consistent with any combination of metastatic or synchronous multifocal lung malignancy."   Of note, the patient is asymptomatic from a lung cancer standpoint as well as a pancreatic  cancer standpoint.  She denies diarrhea.  She has not had any new diagnosis of diabetes.  She is able to get around well and recovered from her robotic pelvic surgery well.     Imaging: 09/19/23 CT abd/pelvis panc protocol IMPRESSION:  3.2 cm pancreatic head mass, consistent with pancreatic adenocarcinoma. 2.  No evidence of metastatic disease.  3.  Cholelithiasis, without associated inflammatory changes.   CT c/a/p 11/01/23 1. Slightly diminished size of a subsolid nodule of the peripheral  right upper lobe, consistent with treatment response.  2. Significant interval increase in size and solid character of  multiple additional bilateral nodular opacities, including several  in the right lower lobe, as well as a subsolid ground-glass opacity  in the dependent left lower lobe. Findings are consistent with any  combination of metastatic and synchronous multifocal lung  malignancy.  3. No significant change in a hypodense, infiltrative appearing mass  occupying the majority of the pancreatic head, measuring 3.4 x 2.7  cm, consistent with primary pancreatic adenocarcinoma. Severe  dilatation of the pancreatic duct.  4. Mass appears to closely contact the adjacent right aspect of the  superior mesenteric vein with less than 180 degrees contact. No  contact to the portal or splenic vein, nor the superior mesenteric  artery and celiac axis.  5. No evidence of lymphadenopathy or metastatic disease in the  abdomen or pelvis.  6. Cholelithiasis.  7. Severe emphysema and diffuse bilateral bronchial wall thickening.  8. Coronary artery disease.  Aortic Atherosclerosis (ICD10-I70.0) and Emphysema (ICD10-J43.9).      EUS: 10/07/23 [facility] EUS impression: -  A lesion was identified in the pancreatic head. Cytology results are pending. However, the endosonographic appearance is suspicious for possible malignancy though SPN should also be considered ( though not normally leading to pancreatic  atrophy and significant ductal dilation) . This was staged T2 N0 Mx by endosonographic criteria and the staging applies if malignancy is confirmed. Fine needle biopsy performed as noted above. - Pancreatic parenchymal abnormalities consisting of atrophy were noted in the entire pancreas. - There was no sign of significant pathology in the common bile duct and in the common hepatic duct. - Multiple stones were visualized endosonographically in the gallbladder. - No malignant- appearing lymph nodes were visualized in the celiac region ( level 20) , peripancreatic region and porta hepatis region.     Cytology 10/07/23 EUS FINAL MICROSCOPIC DIAGNOSIS:  - Adenocarcinoma      Past Medical History      Past Medical History:  Diagnosis Date   COPD (chronic obstructive pulmonary disease) (CMS/HHS-HCC)     History of cancer     Hypertension          Past Surgical History       Past Surgical History:  Procedure Laterality Date    ROBOTIC ASSISTED NAVIGATIONAL BRONCHOSCOPY BRONCHIAL BIOPSIES BRONCHIAL NEEDLE ASPIRATION BIOPSIES BRONCHIAL BRUSHINGS FIDUCIAL MARKER PLACEMENT   07/09/2023    Dr. Langston Masker   ROBOTIC ASSISTED PELVIC MASS EXCISION WITH RADICAL DISSECTION, MINI LAPAROTOMY, CYSTOSCOPY   07/18/2023    Dr. Dorian Pod   UPPER ENDOSCOPIC ULTRASOUND (EUS) RADIAL  BIOPSY  FINE NEEDLE ASPIRATION (FNA) LINEAR  ESOPHAGOGASTRODUODENOSCOPY (EGD) WITH PROPOFOL    10/07/2023    Dr. Meridee Score        Current Medications        Current Outpatient Medications  Medication Sig Dispense Refill   losartan (COZAAR) 50 MG tablet Take 50 mg by mouth once daily       rosuvastatin (CRESTOR) 10 MG tablet Take 1 tablet by mouth once daily        No current facility-administered medications for this visit.          Allergies  No Known Allergies       Family History        Family History  Problem Relation Name Age of Onset   Aneurysm Mother       Stroke Mother       Hyperlipidemia (Elevated  cholesterol) Father       High blood pressure (Hypertension) Father       Breast cancer Maternal Aunt       Diabetes Maternal Aunt       Throat cancer Maternal Uncle              Social History  Social History         Socioeconomic History   Marital status: Married  Tobacco Use   Smoking status: Former      Types: Cigarettes   Smokeless tobacco: Never  Substance and Sexual Activity   Alcohol use: Never   Drug use: Never    Social Drivers of Health        Food Insecurity: No Food Insecurity (08/14/2023)    Received from [facility]    Hunger Vital Sign     Worried About Running Out of Food in the Last Year: Never true     Ran Out of Food in the Last Year: Never true  Transportation Needs: No Transportation Needs (08/14/2023)    [facility] from [facility]  PRAPARE - Therapist, art (Medical): No     Lack of Transportation (Non-Medical): No          REVIEW OF SYSTEMS - PERTINENT POSITIVES ONLY: A complete review of systems negative other than HPI and PMH    EXAM:    Vitals:    11/08/23 0950  BP: 136/70  Pulse: 97  Temp: 36.7 C (98 F)         Wt Readings from Last 3 Encounters:  11/08/23 64 kg (141 lb)        Gen:  No acute distress.  Well nourished and well groomed.   Neurological: Alert and oriented to person, place, and time. Coordination normal.  Head: Normocephalic and atraumatic.  Eyes: Conjunctivae are normal. Pupils are equal, round, and reactive to light. No scleral icterus.  Neck: Normal range of motion. Neck supple. No tracheal deviation or thyromegaly present.  Cardiovascular: Normal rate, regular rhythm, normal heart sounds and intact distal pulses.  Exam reveals no gallop and no friction rub.  No murmur heard. Respiratory: Effort normal.  No respiratory distress. No chest wall tenderness. Breath sounds normal.  No wheezes, rales or rhonchi.  GI: Soft. Bowel sounds are normal. The abdomen is soft and nontender.   There is no rebound and no guarding.  Musculoskeletal: Normal range of motion. Extremities are nontender.  Lymphadenopathy: No cervical, preauricular, postauricular or axillary adenopathy is present Skin: Skin is warm and dry. No rash noted. No diaphoresis. No erythema. No pallor. No clubbing, cyanosis, or edema.   Psychiatric: Normal mood and affect. Behavior is normal. Judgment and thought content normal.      LABORATORY RESULTS: Available labs are reviewed    Labs 10/25/23 CBC and CMET near normal.   CA 19-9 is 35 (upper limit normal)   RADIOLOGY RESULTS: See above.     ASSESSMENT AND PLAN: Adenocarcinoma of head of pancreas (CMS/HHS-HCC)  (primary encounter diagnosis)   Adenocarcinoma of right lung (CMS/HHS-HCC)   Granulosa cell carcinoma of ovary, unspecified laterality (CMS/HHS-HCC)   MGUS (monoclonal gammopathy of unknown significance)   I recommend neoadjuvant chemotherapy given the presence of the indeterminate lung nodules and the abutment of the SMV/portal vein.   I am also concerned about the overall prognosis of the lung cancer    I will communicate with oncology whether or not port needs to be placed and if this will be placed by me or in interventional radiology.   I did review the pathophysiology of adenocarcinoma the pancreatic head.  I discussed port placement and risks including malfunction, infection, pneumothorax.  I also discussed risk of Whipple including death, pancreatic leak, delayed gastric emptying, blood clot, prolonged recovery, prolonged drain placement or wound issues, development of diabetes or pancreatic exocrine insufficiency, heart or lung complications, blood clot.

## 2023-11-22 NOTE — Transfer of Care (Signed)
Immediate Anesthesia Transfer of Care Note  Patient: Tiffany [name]  Procedure(s) Performed: INSERTION PORT-A-CATH  Patient Location: PACU  Anesthesia Type:General  Level of Consciousness: drowsy and patient cooperative  Airway & Oxygen Therapy: Patient Spontanous Breathing and Patient connected to face mask oxygen  Post-op Assessment: Report given to RN and Post -op Vital signs reviewed and stable  Post vital signs: Reviewed and stable  Last Vitals:  Vitals Value Taken Time  BP 135/49 11/22/23 1508  Temp    Pulse 66 11/22/23 1510  Resp 14 11/22/23 1510  SpO2 100 % 11/22/23 1510  Vitals shown include unfiled device data.  Last Pain:  Vitals:   11/22/23 1109  PainSc: 0-No pain         Complications: No notable events documented.

## 2023-11-22 NOTE — Discharge Instructions (Addendum)
[facility] Office Phone Number [phone]   POST OP INSTRUCTIONS  Always review your discharge instruction sheet given to you by the facility where your surgery was performed.  IF YOU HAVE DISABILITY OR FAMILY LEAVE FORMS, YOU MUST BRING THEM TO THE OFFICE FOR PROCESSING.  DO NOT GIVE THEM TO YOUR DOCTOR.  Take 2 tylenol (acetominophen) three times a day for 3 days.  If you still have pain, add ibuprofen with food in between if able to take this (if you have kidney issues or stomach issues, do not take ibuprofen).  If both of those are not enough, add the narcotic pain pill.  If you find you are needing a lot of this overnight after surgery, call the next morning for a refill.   Take your usually prescribed medications unless otherwise directed If you need a refill on your pain medication, please contact your pharmacy.  They will contact our office to request authorization.  Prescriptions will not be filled after 5pm or on week-ends. You should eat very light the first 24 hours after surgery, such as soup, crackers, pudding, etc.  Resume your normal diet the day after surgery It is common to experience some constipation if taking pain medication after surgery.  Increasing fluid intake and taking a stool softener will usually help or prevent this problem from occurring.  A mild laxative (Milk of Magnesia or Miralax) should be taken according to package directions if there are no bowel movements after 48 hours. You may shower in 48 hours.  The surgical glue will flake off in 2-3 weeks.   ACTIVITIES:  No strenuous activity or heavy lifting for 1 week.   You may drive when you no longer are taking prescription pain medication, you can comfortably wear a seatbelt, and you can safely maneuver your car and apply brakes. RETURN TO WORK:  __________to be determined. _______________ You should see your doctor in the office for a follow-up appointment approximately three-four weeks after  your surgery.    WHEN TO CALL YOUR DOCTOR: Fever over 101.0 Nausea and/or vomiting. Extreme swelling or bruising. Continued bleeding from incision. Increased pain, redness, or drainage from the incision.  The clinic staff is available to answer your questions during regular business hours.  Please don't hesitate to call and ask to speak to one of the nurses for clinical concerns.  If you have a medical emergency, go to the nearest emergency room or call 911.  A surgeon from [facility] is always on call at the hospital.  For further questions, please visit [email or web address]   

## 2023-11-23 NOTE — Anesthesia Postprocedure Evaluation (Signed)
Anesthesia Post Note  Patient: Tiffany [name]  Procedure(s) Performed: INSERTION PORT-A-CATH     Patient location during evaluation: PACU Anesthesia Type: General Level of consciousness: awake and alert Pain management: pain level controlled Vital Signs Assessment: post-procedure vital signs reviewed and stable Respiratory status: spontaneous breathing, nonlabored ventilation, respiratory function stable and patient connected to nasal cannula oxygen Cardiovascular status: blood pressure returned to baseline and stable Postop Assessment: no apparent nausea or vomiting Anesthetic complications: no  No notable events documented.  Last Vitals:  Vitals:   11/22/23 1600 11/22/23 1615  BP: 136/65 (!) 143/56  Pulse: 61 (!) 58  Resp: 18 20  Temp:    SpO2: 91% 92%    Last Pain:  Vitals:   11/22/23 1615  PainSc: 0-No pain                 Hicks Feick

## 2023-11-25 ENCOUNTER — Encounter (HOSPITAL_COMMUNITY): Payer: Self-pay | Admitting: General Surgery

## 2023-11-25 ENCOUNTER — Inpatient Hospital Stay: Payer: Medicare Other

## 2023-11-25 ENCOUNTER — Other Ambulatory Visit (HOSPITAL_BASED_OUTPATIENT_CLINIC_OR_DEPARTMENT_OTHER): Payer: Self-pay | Admitting: Pulmonary Disease

## 2023-11-26 ENCOUNTER — Inpatient Hospital Stay: Payer: Medicare Other

## 2023-11-26 ENCOUNTER — Encounter: Payer: Self-pay | Admitting: Hematology

## 2023-11-26 DIAGNOSIS — Z90722 Acquired absence of ovaries, bilateral: Secondary | ICD-10-CM | POA: Diagnosis not present

## 2023-11-26 DIAGNOSIS — Z923 Personal history of irradiation: Secondary | ICD-10-CM | POA: Diagnosis not present

## 2023-11-26 DIAGNOSIS — R918 Other nonspecific abnormal finding of lung field: Secondary | ICD-10-CM | POA: Diagnosis not present

## 2023-11-26 DIAGNOSIS — C250 Malignant neoplasm of head of pancreas: Secondary | ICD-10-CM | POA: Diagnosis not present

## 2023-11-26 DIAGNOSIS — Z8543 Personal history of malignant neoplasm of ovary: Secondary | ICD-10-CM | POA: Diagnosis not present

## 2023-11-26 DIAGNOSIS — D3910 Neoplasm of uncertain behavior of unspecified ovary: Secondary | ICD-10-CM

## 2023-11-26 DIAGNOSIS — G893 Neoplasm related pain (acute) (chronic): Secondary | ICD-10-CM | POA: Diagnosis not present

## 2023-11-26 DIAGNOSIS — C3411 Malignant neoplasm of upper lobe, right bronchus or lung: Secondary | ICD-10-CM | POA: Diagnosis not present

## 2023-11-26 LAB — CBC WITH DIFFERENTIAL (CANCER CENTER ONLY)
Abs Immature Granulocytes: 0.01 10*3/uL (ref 0.00–0.07)
Basophils Absolute: 0 10*3/uL (ref 0.0–0.1)
Basophils Relative: 1 %
Eosinophils Absolute: 0.2 10*3/uL (ref 0.0–0.5)
Eosinophils Relative: 3 %
HCT: 38 % (ref 36.0–46.0)
Hemoglobin: 12.3 g/dL (ref 12.0–15.0)
Immature Granulocytes: 0 %
Lymphocytes Relative: 16 %
Lymphs Abs: 0.9 10*3/uL (ref 0.7–4.0)
MCH: 30.1 pg (ref 26.0–34.0)
MCHC: 32.4 g/dL (ref 30.0–36.0)
MCV: 93.1 fL (ref 80.0–100.0)
Monocytes Absolute: 0.5 10*3/uL (ref 0.1–1.0)
Monocytes Relative: 8 %
Neutro Abs: 4.2 10*3/uL (ref 1.7–7.7)
Neutrophils Relative %: 72 %
Platelet Count: 152 10*3/uL (ref 150–400)
RBC: 4.08 MIL/uL (ref 3.87–5.11)
RDW: 12.8 % (ref 11.5–15.5)
WBC Count: 5.9 10*3/uL (ref 4.0–10.5)
nRBC: 0 % (ref 0.0–0.2)

## 2023-11-26 LAB — CMP (CANCER CENTER ONLY)
ALT: 11 U/L (ref 0–44)
AST: 15 U/L (ref 15–41)
Albumin: 4.2 g/dL (ref 3.5–5.0)
Alkaline Phosphatase: 93 U/L (ref 38–126)
Anion gap: 6 (ref 5–15)
BUN: 11 mg/dL (ref 8–23)
CO2: 32 mmol/L (ref 22–32)
Calcium: 9.6 mg/dL (ref 8.9–10.3)
Chloride: 103 mmol/L (ref 98–111)
Creatinine: 0.69 mg/dL (ref 0.44–1.00)
GFR, Estimated: >60 mL/min (ref >60)
Glucose, Bld: 130 mg/dL — ABNORMAL HIGH (ref 70–99)
Potassium: 4 mmol/L (ref 3.5–5.1)
Sodium: 141 mmol/L (ref 135–145)
Total Bilirubin: 0.6 mg/dL (ref 0.0–1.2)
Total Protein: 7.3 g/dL (ref 6.5–8.1)

## 2023-11-26 NOTE — Progress Notes (Signed)
 Pharmacist Chemotherapy Monitoring - Initial Assessment    Anticipated start date: 12/04/23   The following has been reviewed per standard work regarding the patient's treatment regimen: The patient's diagnosis, treatment plan and drug doses, and organ/hematologic function Lab orders and baseline tests specific to treatment regimen  The treatment plan start date, drug sequencing, and pre-medications Prior authorization status  Patient's documented medication list, including drug-drug interaction screen and prescriptions for anti-emetics and supportive care specific to the treatment regimen The drug concentrations, fluid compatibility, administration routes, and timing of the medications to be used The patient's access for treatment and lifetime cumulative dose history, if applicable  The patient's medication allergies and previous infusion related reactions, if applicable   Changes made to treatment plan:  N/A  Follow up needed:  N/A   Corean Sever, PharmD PGY2 Oncology Pharmacy Resident   11/26/2023 12:14 PM

## 2023-11-28 LAB — INHIBIN B: Inhibin B: 61.4 pg/mL — ABNORMAL HIGH (ref 0.0–16.9)

## 2023-11-29 ENCOUNTER — Encounter: Payer: Self-pay | Admitting: Hematology

## 2023-12-02 ENCOUNTER — Inpatient Hospital Stay: Payer: Medicare Other | Admitting: Internal Medicine

## 2023-12-03 ENCOUNTER — Inpatient Hospital Stay: Payer: Medicare Other | Attending: Gynecologic Oncology | Admitting: Internal Medicine

## 2023-12-03 VITALS — BP 134/61 | HR 78 | Temp 98.2°F | Resp 18 | Ht 65.0 in | Wt 137.7 lb

## 2023-12-03 DIAGNOSIS — K5900 Constipation, unspecified: Secondary | ICD-10-CM | POA: Diagnosis not present

## 2023-12-03 DIAGNOSIS — Z79899 Other long term (current) drug therapy: Secondary | ICD-10-CM | POA: Diagnosis not present

## 2023-12-03 DIAGNOSIS — R112 Nausea with vomiting, unspecified: Secondary | ICD-10-CM | POA: Diagnosis not present

## 2023-12-03 DIAGNOSIS — K1379 Other lesions of oral mucosa: Secondary | ICD-10-CM | POA: Insufficient documentation

## 2023-12-03 DIAGNOSIS — Z79631 Long term (current) use of antimetabolite agent: Secondary | ICD-10-CM | POA: Insufficient documentation

## 2023-12-03 DIAGNOSIS — Z85828 Personal history of other malignant neoplasm of skin: Secondary | ICD-10-CM | POA: Diagnosis not present

## 2023-12-03 DIAGNOSIS — Z90722 Acquired absence of ovaries, bilateral: Secondary | ICD-10-CM | POA: Insufficient documentation

## 2023-12-03 DIAGNOSIS — R110 Nausea: Secondary | ICD-10-CM | POA: Diagnosis not present

## 2023-12-03 DIAGNOSIS — C250 Malignant neoplasm of head of pancreas: Secondary | ICD-10-CM | POA: Diagnosis not present

## 2023-12-03 DIAGNOSIS — M8580 Other specified disorders of bone density and structure, unspecified site: Secondary | ICD-10-CM | POA: Diagnosis not present

## 2023-12-03 DIAGNOSIS — Z860100 Personal history of colon polyps, unspecified: Secondary | ICD-10-CM | POA: Insufficient documentation

## 2023-12-03 DIAGNOSIS — Z79633 Long term (current) use of mitotic inhibitor: Secondary | ICD-10-CM | POA: Insufficient documentation

## 2023-12-03 DIAGNOSIS — Z5111 Encounter for antineoplastic chemotherapy: Secondary | ICD-10-CM | POA: Insufficient documentation

## 2023-12-03 DIAGNOSIS — Z803 Family history of malignant neoplasm of breast: Secondary | ICD-10-CM | POA: Diagnosis not present

## 2023-12-03 DIAGNOSIS — Z90710 Acquired absence of both cervix and uterus: Secondary | ICD-10-CM | POA: Insufficient documentation

## 2023-12-03 DIAGNOSIS — C3411 Malignant neoplasm of upper lobe, right bronchus or lung: Secondary | ICD-10-CM | POA: Insufficient documentation

## 2023-12-03 DIAGNOSIS — R1900 Intra-abdominal and pelvic swelling, mass and lump, unspecified site: Secondary | ICD-10-CM | POA: Diagnosis not present

## 2023-12-03 DIAGNOSIS — R197 Diarrhea, unspecified: Secondary | ICD-10-CM | POA: Diagnosis not present

## 2023-12-03 NOTE — Progress Notes (Signed)
 [facility] [phone]   [phone]  OFFICE PROGRESS NOTE  Tiffany Dawes, MD [address] [place] [address]  DIAGNOSIS:  1) Stage Ib (t2a, N0, M0) non-small cell lung cancer, adenocarcinoma presented with right upper lobe hypermetabolic lung mass confirmed with biopsy.  There was also an additional suspicious right lower lobe lung nodule that was not hypermetabolic on the recent PET scan.   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 Lonn. 3) The patient also has a soft tissue lesion in the pancreatic area and she is scheduled to have MRI of the abdomen in early October 2024.  PRIOR THERAPY: 1) post SBRT to the right upper lobe lung mass by radiation oncology 2) status post surgical resection of the pelvic mass consistent with recurrent granulosa cell tumor.  CURRENT THERAPY: Systemic chemotherapy with Abraxane  and gemcitabine  for pancreatic cancer under the care of Dr. Lanny  INTERVAL HISTORY: Tiffany [name] 78 y.o. female returns to the clinic today for follow-up visit.  The patient is feeling fine today with no concerning complaints except for mild fatigue and cough.  She denied having any current chest pain, shortness of breath or hemoptysis.  She has no nausea, vomiting, diarrhea or constipation.  She has no headache or visual changes.  She was diagnosed with a stage Ib non-small cell lung cancer, adenocarcinoma presented with right upper lobe lung nodule treated with SBRT.  She also had surgical resection of a pelvic mass that was consistent with granulosa cell tumor.  The patient has a recent diagnosis of pancreatic adenocarcinoma but she declined to proceed with surgical resection because of her age and other comorbidities.  She is seen by Dr. Wilmer and expected to start systemic chemotherapy with Abraxane  and gemcitabine  for  the pancreatic cancer tomorrow.  The patient had CT scan of the chest, abdomen and pelvis performed on January 30, 2023 and that showed multiple pulmonary nodules suspicious for metastatic disease in the lung.  MEDICAL HISTORY: Past Medical History:  Diagnosis Date   Abnormal PFT 09/02/2014   moderate restrictive ventilatory defect.   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 Ivin   Colon polyp 12/2003   Diverticulosis 11/2010   seen on colonoscopy and air contrast BE   Emphysema of lung (HCC)    Family history of breast cancer    GERD (gastroesophageal reflux disease)    History of radiation therapy    Right Lung-09/04/23-09/09/23- Dr. Lynwood Nasuti   Hyperlipidemia    Hypertension    previously treated with meds, resolved   MGUS (monoclonal gammopathy of unknown significance) 1999   prevously under care of Dr. 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  MEDICATIONS:  Current Outpatient Medications  Medication Sig Dispense Refill   acetaminophen  (TYLENOL )  500 MG tablet Take 2 tablets (1,000 mg total) by mouth every 8 (eight) hours as needed for mild pain (pain score 1-3). May use Tylenol  for next 1-3 days if needed for abdominal pain, but otherwise call office if any issues arise (Patient not taking: Reported on 11/21/2023)     albuterol  (VENTOLIN  HFA) 108 (90 Base) MCG/ACT inhaler INHALE 2 PUFFS BY MOUTH EVERY 6 HOURS AS NEEDED FOR WHEEZING FOR SHORTNESS OF BREATH 9 g 0   cholecalciferol  (VITAMIN D3) 25 MCG (1000 UNIT) tablet Take 1,000 Units by mouth daily.     Coenzyme Q10 (COQ10) 100 MG CAPS Take 100 mg by mouth daily.     lidocaine -prilocaine  (EMLA ) cream Apply to affected area once (Patient not taking: Reported on 11/21/2023) 30 g 3   losartan  (COZAAR ) 50 MG tablet Take 1 tablet (50 mg total) by mouth daily. 90 tablet 3   ondansetron  (ZOFRAN ) 8 MG tablet Take 1 tablet (8 mg total) by mouth every 8 (eight) hours as needed for nausea or vomiting. (Patient not taking: Reported on 11/21/2023) 30 tablet 1   oxyCODONE  (OXY IR/ROXICODONE ) 5 MG immediate release tablet Take 1 tablet (5 mg total) by mouth every 6 (six) hours as needed for severe pain (pain score 7-10). 8 tablet 0   prochlorperazine  (COMPAZINE ) 10 MG tablet Take 1 tablet (10 mg total) by mouth every 6 (six) hours as needed for nausea or vomiting. (Patient not taking: Reported on 11/21/2023) 30 tablet 1   rosuvastatin  (CRESTOR ) 10 MG tablet Take 1 tablet (10 mg total) by mouth daily. (Patient taking differently: Take 10 mg by mouth at bedtime.) 90 tablet 3   Tiotropium Bromide -Olodaterol (STIOLTO RESPIMAT ) 2.5-2.5 MCG/ACT AERS INHALE 2 PUFFS BY MOUTH ONCE DAILY 4 g 11   No current facility-administered medications for this visit.    SURGICAL HISTORY:  Past Surgical History:  Procedure Laterality Date   BASAL CELL CARCINOMA EXCISION  11/26/2008   L face, R temple   BILATERAL SALPINGOOPHORECTOMY  11/26/2005   cancerous tumor R ovary   BIOPSY  10/07/2023   Procedure: BIOPSY;  Surgeon: Wilhelmenia Aloha Raddle., MD;  Location: [facility];  Service: Gastroenterology;;   BRONCHIAL BIOPSY  07/09/2023   Procedure: BRONCHIAL BIOPSIES;  Surgeon: Shelah Lamar RAMAN, MD;  Location: [facility] END[facility]COPY;  Service: Pulmonary;;   BRONCHIAL BRUSHINGS  07/09/2023   Procedure: BRONCHIAL BRUSHINGS;  Surgeon: Shelah Lamar RAMAN, MD;  Location: [facility] ENDOSCOPY;  Service: Pulmonary;;   BRONCHIAL NEEDLE ASPIRATION BIOPSY  07/09/2023   Procedure:  BRONCHIAL NEEDLE ASPIRATION BIOPSIES;  Surgeon: Shelah Lamar RAMAN, MD;  Location: [facility] ENDOSCOPY;  Service: Pulmonary;;   CATARACT EXTRACTION  11/27/2007   bilateral   COLONOSCOPY  11/26/2010   Dr. Vicci. Diverticulosis. BE also done--normal   ESOPHAGOGASTRODUODENOSCOPY (EGD) WITH PROPOFOL  N/A 10/07/2023   Procedure: ESOPHAGOGASTRODUODENOSCOPY (EGD) WITH PROPOFOL ;  Surgeon: Wilhelmenia Aloha Raddle., MD;  Location: [facility] ENDOSCOPY;  Service: Gastroenterology;  Laterality: N/A;   EUS N/A 10/07/2023   Procedure: UPPER ENDOSCOPIC ULTRASOUND (EUS) RADIAL;  Surgeon: Wilhelmenia Aloha Raddle., MD;  Location: [facility] ENDOSCOPY;  Service: Gastroenterology;  Laterality: N/A;   FIDUCIAL MARKER PLACEMENT  07/09/2023   Procedure: FIDUCIAL MARKER PLACEMENT;  Surgeon: Shelah Lamar RAMAN, MD;  Location: [facility] ENDOSCOPY;  Service: Pulmonary;;   FINE NEEDLE ASPIRATION N/A 10/07/2023   Procedure: FINE NEEDLE ASPIRATION (FNA) LINEAR;  Surgeon: Wilhelmenia Aloha Raddle., MD;  Location: [facility];  Service: Gastroenterology;  Laterality: N/A;   OMENTECTOMY  11/26/2005   and pelvic lymphadenectomy (with BSO)  PORTACATH PLACEMENT N/A 11/22/2023   Procedure: INSERTION PORT-A-CATH;  Surgeon: Aron Shoulders, MD;  Location: [facility] OR;  Service: General;  Laterality: N/A;   ROBOTIC ASSISTED LAPAROSCOPIC LYSIS OF ADHESION N/A 07/18/2023   Procedure: ROBOTIC ASSISTED PELVIC MASS EXCISION WITH RADICAL DISSECTION, MINI LAPAROTOMY, CYSTOSCOPY;  Surgeon: Viktoria Comer SAUNDERS, MD;  Location: [facility] ORS;  Service: Gynecology;  Laterality: N/A;   SKIN CANCER EXCISION Left 07/2021   neck; pt can't recall type of cancer   TONSILLECTOMY  age 38   VAGINAL HYSTERECTOMY  late 20's   didn't want any more children    REVIEW OF SYSTEMS:  Constitutional: positive for fatigue Eyes: negative Ears, nose, mouth, throat, and face: negative Respiratory: positive for cough Cardiovascular: negative Gastrointestinal: negative Genitourinary:negative Integument/breast:  negative Hematologic/lymphatic: negative Musculoskeletal:negative Neurological: negative Behavioral/Psych: negative Endocrine: negative Allergic/Immunologic: negative   PHYSICAL EXAMINATION: General appearance: alert, cooperative, appears stated age, and fatigued Head: Normocephalic, without obvious abnormality, atraumatic Neck: no adenopathy, no JVD, supple, symmetrical, trachea midline, and thyroid  not enlarged, symmetric, no tenderness/mass/nodules Lymph nodes: Cervical, supraclavicular, and axillary nodes normal. Resp: clear to auscultation bilaterally Back: symmetric, no curvature. ROM normal. No CVA tenderness. Cardio: regular rate and rhythm, S1, S2 normal, no murmur, click, rub or gallop GI: soft, non-tender; bowel sounds normal; no masses,  no organomegaly Extremities: extremities normal, atraumatic, no cyanosis or edema Neurologic: Alert and oriented X 3, normal strength and tone. Normal symmetric reflexes. Normal coordination and gait  ECOG PERFORMANCE STATUS: 1 - Symptomatic but completely ambulatory  Blood pressure 134/61, pulse 78, temperature 98.2 F (36.8 C), temperature source Temporal, resp. rate 18, height 5' 5 (1.651 m), weight 137 lb 11.2 oz (62.5 kg), SpO2 99%.  LABORATORY DATA: Lab Results  Component Value Date   WBC 5.9 11/26/2023   HGB 12.3 11/26/2023   HCT 38.0 11/26/2023   MCV 93.1 11/26/2023   PLT 152 11/26/2023      Chemistry      Component Value Date/Time   NA 141 11/26/2023 1320   NA 137 05/08/2023 0953   K 4.0 11/26/2023 1320   CL 103 11/26/2023 1320   CO2 32 11/26/2023 1320   BUN 11 11/26/2023 1320   BUN 17 05/08/2023 0953   CREATININE 0.69 11/26/2023 1320   CREATININE 0.72 12/02/2017 0945      Component Value Date/Time   CALCIUM  9.6 11/26/2023 1320   ALKPHOS 93 11/26/2023 1320   AST 15 11/26/2023 1320   ALT 11 11/26/2023 1320   BILITOT 0.6 11/26/2023 1320       RADIOGRAPHIC STUDIES: DG CHEST PORT 1 VIEW Result Date:  11/22/2023 CLINICAL DATA:  Port-A-Cath placement EXAM: PORTABLE CHEST - 1 VIEW COMPARISON:  10/07/2023 FINDINGS: Left subclavian port catheter placement to the distal SVC. No pneumothorax. Mild prominence of pulmonary interstitial markings . Fiducial marker projects in the right mid lung with an ill-defined opacity just peripheral, decreased in conspicuity since prior exam Heart size and mediastinal contours are within normal limits. No effusion. Visualized bones unremarkable. IMPRESSION: Left subclavian port catheter placement without pneumothorax. Electronically Signed   By: JONETTA Faes M.D.   On: 11/22/2023 16:24   DG C-Arm 1-60 Min-No Report Result Date: 11/22/2023 Fluoroscopy was utilized by the requesting physician.  No radiographic interpretation.    ASSESSMENT AND PLAN: This is a very pleasant 78 years old white female with: 1) Stage Ib (t2a, N0, M0) non-small cell lung cancer, adenocarcinoma presented with right upper lobe hypermetabolic lung mass confirmed with biopsy.  There was  also an additional suspicious right lower lobe lung nodule that was not hypermetabolic on the recent PET scan.  Status post SBRT to the right upper lobe lung mass by radiation oncology. 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 Lonn.  Status post surgical resection and the pelvic mass was consistent with recurrent ranula was a cell tumor. 3) The patient also has a soft tissue lesion in the pancreatic area consistent with pancreatic adenocarcinoma and she is scheduled to have MRI of the abdomen in early October 2024. She is expected to start systemic chemotherapy with Abraxane  and gemcitabine  tomorrow under the care of Dr. Lanny for the pancreatic adenocarcinoma She had CT scan of the chest, abdomen and pelvis in December 2024 that showed multiple pulmonary nodules suspicious for metastatic disease from the lung  cancer but also could be from pancreatic cancer. I recommended for the patient to proceed with her treatment with gemcitabine  and Abraxane  as planned by Dr. Lanny and will continue to monitor the pulmonary nodule closely on the upcoming imaging studies. She will continue her care by Dr. Lanny at this point and I will be happy to assist in her management in the future on as-needed basis. The patient was advised to call immediately if she has any other concerning symptoms in the interval. The patient voices understanding of current disease status and treatment options and is in agreement with the current care plan.  All questions were answered. The patient knows to call the clinic with any problems, questions or concerns. We can certainly see the patient much sooner if necessary.  The total time spent in the appointment was 40 minutes.  Disclaimer: This note was dictated with voice recognition software. Similar sounding words can inadvertently be transcribed and may not be corrected upon review.

## 2023-12-04 ENCOUNTER — Other Ambulatory Visit: Payer: Self-pay

## 2023-12-04 ENCOUNTER — Inpatient Hospital Stay: Payer: Medicare Other

## 2023-12-04 ENCOUNTER — Encounter: Payer: Self-pay | Admitting: Nurse Practitioner

## 2023-12-04 ENCOUNTER — Inpatient Hospital Stay: Payer: Medicare Other | Admitting: Nurse Practitioner

## 2023-12-04 ENCOUNTER — Other Ambulatory Visit: Payer: Medicare Other

## 2023-12-04 VITALS — BP 147/72 | HR 66 | Temp 97.9°F | Resp 17 | Ht 65.0 in | Wt 139.2 lb

## 2023-12-04 DIAGNOSIS — K5900 Constipation, unspecified: Secondary | ICD-10-CM | POA: Diagnosis not present

## 2023-12-04 DIAGNOSIS — C3411 Malignant neoplasm of upper lobe, right bronchus or lung: Secondary | ICD-10-CM | POA: Diagnosis not present

## 2023-12-04 DIAGNOSIS — C250 Malignant neoplasm of head of pancreas: Secondary | ICD-10-CM

## 2023-12-04 DIAGNOSIS — Z860100 Personal history of colon polyps, unspecified: Secondary | ICD-10-CM | POA: Diagnosis not present

## 2023-12-04 DIAGNOSIS — Z90722 Acquired absence of ovaries, bilateral: Secondary | ICD-10-CM | POA: Diagnosis not present

## 2023-12-04 DIAGNOSIS — Z90710 Acquired absence of both cervix and uterus: Secondary | ICD-10-CM | POA: Diagnosis not present

## 2023-12-04 DIAGNOSIS — Z79631 Long term (current) use of antimetabolite agent: Secondary | ICD-10-CM | POA: Diagnosis not present

## 2023-12-04 DIAGNOSIS — R1900 Intra-abdominal and pelvic swelling, mass and lump, unspecified site: Secondary | ICD-10-CM | POA: Diagnosis not present

## 2023-12-04 DIAGNOSIS — M8580 Other specified disorders of bone density and structure, unspecified site: Secondary | ICD-10-CM | POA: Diagnosis not present

## 2023-12-04 DIAGNOSIS — R112 Nausea with vomiting, unspecified: Secondary | ICD-10-CM | POA: Diagnosis not present

## 2023-12-04 DIAGNOSIS — Z79633 Long term (current) use of mitotic inhibitor: Secondary | ICD-10-CM | POA: Diagnosis not present

## 2023-12-04 DIAGNOSIS — R110 Nausea: Secondary | ICD-10-CM | POA: Diagnosis not present

## 2023-12-04 DIAGNOSIS — Z5111 Encounter for antineoplastic chemotherapy: Secondary | ICD-10-CM | POA: Diagnosis not present

## 2023-12-04 DIAGNOSIS — Z79899 Other long term (current) drug therapy: Secondary | ICD-10-CM | POA: Diagnosis not present

## 2023-12-04 DIAGNOSIS — Z803 Family history of malignant neoplasm of breast: Secondary | ICD-10-CM | POA: Diagnosis not present

## 2023-12-04 DIAGNOSIS — R197 Diarrhea, unspecified: Secondary | ICD-10-CM | POA: Diagnosis not present

## 2023-12-04 DIAGNOSIS — K1379 Other lesions of oral mucosa: Secondary | ICD-10-CM | POA: Diagnosis not present

## 2023-12-04 DIAGNOSIS — Z85828 Personal history of other malignant neoplasm of skin: Secondary | ICD-10-CM | POA: Diagnosis not present

## 2023-12-04 LAB — CBC WITH DIFFERENTIAL (CANCER CENTER ONLY)
Abs Immature Granulocytes: 0.03 10*3/uL (ref 0.00–0.07)
Basophils Absolute: 0 10*3/uL (ref 0.0–0.1)
Basophils Relative: 1 %
Eosinophils Absolute: 0.1 10*3/uL (ref 0.0–0.5)
Eosinophils Relative: 2 %
HCT: 36.5 % (ref 36.0–46.0)
Hemoglobin: 12.5 g/dL (ref 12.0–15.0)
Immature Granulocytes: 1 %
Lymphocytes Relative: 15 %
Lymphs Abs: 0.9 10*3/uL (ref 0.7–4.0)
MCH: 31.2 pg (ref 26.0–34.0)
MCHC: 34.2 g/dL (ref 30.0–36.0)
MCV: 91 fL (ref 80.0–100.0)
Monocytes Absolute: 0.5 10*3/uL (ref 0.1–1.0)
Monocytes Relative: 9 %
Neutro Abs: 4.3 10*3/uL (ref 1.7–7.7)
Neutrophils Relative %: 72 %
Platelet Count: 161 10*3/uL (ref 150–400)
RBC: 4.01 MIL/uL (ref 3.87–5.11)
RDW: 12.7 % (ref 11.5–15.5)
WBC Count: 5.9 10*3/uL (ref 4.0–10.5)
nRBC: 0 % (ref 0.0–0.2)

## 2023-12-04 LAB — CMP (CANCER CENTER ONLY)
ALT: 13 U/L (ref 0–44)
AST: 16 U/L (ref 15–41)
Albumin: 4.1 g/dL (ref 3.5–5.0)
Alkaline Phosphatase: 97 U/L (ref 38–126)
Anion gap: 7 (ref 5–15)
BUN: 14 mg/dL (ref 8–23)
CO2: 30 mmol/L (ref 22–32)
Calcium: 9.8 mg/dL (ref 8.9–10.3)
Chloride: 102 mmol/L (ref 98–111)
Creatinine: 0.64 mg/dL (ref 0.44–1.00)
GFR, Estimated: >60 mL/min (ref >60)
Glucose, Bld: 118 mg/dL — ABNORMAL HIGH (ref 70–99)
Potassium: 3.8 mmol/L (ref 3.5–5.1)
Sodium: 139 mmol/L (ref 135–145)
Total Bilirubin: 0.6 mg/dL (ref 0.0–1.2)
Total Protein: 7.2 g/dL (ref 6.5–8.1)

## 2023-12-04 MED ORDER — HEPARIN SOD (PORK) LOCK FLUSH 100 UNIT/ML IV SOLN
500.0000 [IU] | Freq: Once | INTRAVENOUS | Status: AC | PRN
  Administered 2023-12-04: 500 [IU]

## 2023-12-04 MED ORDER — GEMCITABINE HCL CHEMO INJECTION 1 GM/26.3ML
1000.0000 mg/m2 | Freq: Once | INTRAVENOUS | Status: AC
  Administered 2023-12-04: 1711 mg via INTRAVENOUS
  Filled 2023-12-04: qty 45

## 2023-12-04 MED ORDER — SODIUM CHLORIDE 0.9 % IV SOLN
INTRAVENOUS | Status: DC
Start: 2023-12-04 — End: 2023-12-04

## 2023-12-04 MED ORDER — PROCHLORPERAZINE MALEATE 10 MG PO TABS
10.0000 mg | ORAL_TABLET | Freq: Once | ORAL | Status: AC
  Administered 2023-12-04: 10 mg via ORAL
  Filled 2023-12-04: qty 1

## 2023-12-04 MED ORDER — PACLITAXEL PROTEIN-BOUND CHEMO INJECTION 100 MG
125.0000 mg/m2 | Freq: Once | INTRAVENOUS | Status: AC
  Administered 2023-12-04: 200 mg via INTRAVENOUS
  Filled 2023-12-04: qty 40

## 2023-12-04 MED ORDER — SODIUM CHLORIDE 0.9% FLUSH
10.0000 mL | INTRAVENOUS | Status: DC | PRN
  Administered 2023-12-04: 10 mL

## 2023-12-04 NOTE — Patient Instructions (Signed)
 [facility] - A DEPT OF [facility]  Discharge Instructions: Thank you for choosing [facility] to provide your oncology and hematology care.   If you have a lab appointment with the Cancer Center, please go directly to the Cancer Center and check in at the registration area.   Wear comfortable clothing and clothing appropriate for easy access to any Portacath or PICC line.   We strive to give you quality time with your provider. You may need to reschedule your appointment if you arrive late (15 or more minutes).  Arriving late affects you and other patients whose appointments are after yours.  Also, if you miss three or more appointments without notifying the office, you may be dismissed from the clinic at the provider's discretion.      For prescription refill requests, have your pharmacy contact our office and allow 72 hours for refills to be completed.    Today you received the following chemotherapy and/or immunotherapy agents Nab-Paclitaxel Protein Bound (Abraxane) and Gemcitabine (Gemzar)   To help prevent nausea and vomiting after your treatment, we encourage you to take your nausea medication as directed.  BELOW ARE SYMPTOMS THAT SHOULD BE REPORTED IMMEDIATELY: *FEVER GREATER THAN 100.4 F (38 C) OR HIGHER *CHILLS OR SWEATING *NAUSEA AND VOMITING THAT IS NOT CONTROLLED WITH YOUR NAUSEA MEDICATION *UNUSUAL SHORTNESS OF BREATH *UNUSUAL BRUISING OR BLEEDING *URINARY PROBLEMS (pain or burning when urinating, or frequent urination) *BOWEL PROBLEMS (unusual diarrhea, constipation, pain near the anus) TENDERNESS IN MOUTH AND THROAT WITH OR WITHOUT PRESENCE OF ULCERS (sore throat, sores in mouth, or a toothache) UNUSUAL RASH, SWELLING OR PAIN  UNUSUAL VAGINAL DISCHARGE OR ITCHING   Items with * indicate a potential emergency and should be followed up as soon as possible or go to the Emergency Department if any problems should occur.  Please  show the CHEMOTHERAPY ALERT CARD or IMMUNOTHERAPY ALERT CARD at check-in to the Emergency Department and triage nurse.  Should you have questions after your visit or need to cancel or reschedule your appointment, please contact [facility] - A DEPT OF [facility]  Dept: [phone]  and follow the prompts.  Office hours are 8:00 a.m. to 4:30 p.m. Monday - Friday. Please note that voicemails left after 4:00 p.m. may not be returned until the following business day.  We are closed weekends and major holidays. You have access to a nurse at all times for urgent questions. Please call the main number to the clinic Dept: [phone] and follow the prompts.   For any non-urgent questions, you may also contact your provider using MyChart. We now offer e-Visits for anyone 72 and older to request care online for non-urgent symptoms. For details visit [email or web address].   Also download the MyChart app! Go to the app store, search "MyChart", open the app, select [place], and log in with your MyChart username and password.  Paclitaxel Nanoparticle Albumin-Bound Injection What is this medication? NANOPARTICLE ALBUMIN-BOUND PACLITAXEL (Na no PAHR ti kuhl al BYOO muhn-bound PAK li TAX el) treats some types of cancer. It works by slowing down the growth of cancer cells. This medicine may be used for other purposes; ask your health care provider or pharmacist if you have questions. COMMON BRAND NAME(S): Abraxane What should I tell my care team before I take this medication? They need to know if you have any of these conditions: Liver disease Low white blood  cell levels An unusual or allergic reaction to paclitaxel, albumin, other medications, foods, dyes, or preservatives If you or your partner are pregnant or trying to get pregnant Breast-feeding How should I use this medication? This medication is injected into a vein. It is given by your care team in a hospital or  clinic setting. Talk to your care team about the use of this medication in children. Special care may be needed. Overdosage: If you think you have taken too much of this medicine contact a poison control center or emergency room at once. NOTE: This medicine is only for you. Do not share this medicine with others. What if I miss a dose? Keep appointments for follow-up doses. It is important not to miss your dose. Call your care team if you are unable to keep an appointment. What may interact with this medication? Other medications may affect the way this medication works. Talk with your care team about all of the medications you take. They may suggest changes to your treatment plan to lower the risk of side effects and to make sure your medications work as intended. This list may not describe all possible interactions. Give your health care provider a list of all the medicines, herbs, non-prescription drugs, or dietary supplements you use. Also tell them if you smoke, drink alcohol, or use illegal drugs. Some items may interact with your medicine. What should I watch for while using this medication? Your condition will be monitored carefully while you are receiving this medication. You may need blood work while taking this medication. This medication may make you feel generally unwell. This is not uncommon as chemotherapy can affect healthy cells as well as cancer cells. Report any side effects. Continue your course of treatment even though you feel ill unless your care team tells you to stop. This medication can cause serious allergic reactions. To reduce the risk, your care team may give you other medications to take before receiving this one. Be sure to follow the directions from your care team. This medication may increase your risk of getting an infection. Call your care team for advice if you get a fever, chills, sore throat, or other symptoms of a cold or flu. Do not treat yourself. Try to avoid  being around people who are sick. This medication may increase your risk to bruise or bleed. Call your care team if you notice any unusual bleeding. Be careful brushing or flossing your teeth or using a toothpick because you may get an infection or bleed more easily. If you have any dental work done, tell your dentist you are receiving this medication. Talk to your care team if you or your partner may be pregnant. Serious birth defects can occur if you take this medication during pregnancy and for 6 months after the last dose. You will need a negative pregnancy test before starting this medication. Contraception is recommended while taking this medication and for 6 months after the last dose. Your care team can help you find the option that works for you. If your partner can get pregnant, use a condom during sex while taking this medication and for 3 months after the last dose. Do not breastfeed while taking this medication and for 2 weeks after the last dose. This medication may cause infertility. Talk to your care team if you are concerned about your fertility. What side effects may I notice from receiving this medication? Side effects that you should report to your care team as soon as  possible: Allergic reactions--skin rash, itching, hives, swelling of the face, lips, tongue, or throat Dry cough, shortness of breath or trouble breathing Infection--fever, chills, cough, sore throat, wounds that don't heal, pain or trouble when passing urine, general feeling of discomfort or being unwell Low red blood cell level--unusual weakness or fatigue, dizziness, headache, trouble breathing Pain, tingling, or numbness in the hands or feet Stomach pain, unusual weakness or fatigue, nausea, vomiting, diarrhea, or fever that lasts longer than expected Unusual bruising or bleeding Side effects that usually do not require medical attention (report to your care team if they continue or are  bothersome): Diarrhea Fatigue Hair loss Loss of appetite Nausea Vomiting This list may not describe all possible side effects. Call your doctor for medical advice about side effects. You may report side effects to FDA at 1-800-FDA-1088. Where should I keep my medication? This medication is given in a hospital or clinic. It will not be stored at home. NOTE: This sheet is a summary. It may not cover all possible information. If you have questions about this medicine, talk to your doctor, pharmacist, or health care provider.  2024 Elsevier/Gold Standard (2022-03-29 00:00:00) Gemcitabine Injection What is this medication? GEMCITABINE (jem SYE ta been) treats some types of cancer. It works by slowing down the growth of cancer cells. This medicine may be used for other purposes; ask your health care provider or pharmacist if you have questions. COMMON BRAND NAME(S): Gemzar, Infugem What should I tell my care team before I take this medication? They need to know if you have any of these conditions: Blood disorders Infection Kidney disease Liver disease Lung or breathing disease, such as asthma or COPD Recent or ongoing radiation therapy An unusual or allergic reaction to gemcitabine, other medications, foods, dyes, or preservatives If you or your partner are pregnant or trying to get pregnant Breast-feeding How should I use this medication? This medication is injected into a vein. It is given by your care team in a hospital or clinic setting. Talk to your care team about the use of this medication in children. Special care may be needed. Overdosage: If you think you have taken too much of this medicine contact a poison control center or emergency room at once. NOTE: This medicine is only for you. Do not share this medicine with others. What if I miss a dose? Keep appointments for follow-up doses. It is important not to miss your dose. Call your care team if you are unable to keep an  appointment. What may interact with this medication? Interactions have not been studied. This list may not describe all possible interactions. Give your health care provider a list of all the medicines, herbs, non-prescription drugs, or dietary supplements you use. Also tell them if you smoke, drink alcohol, or use illegal drugs. Some items may interact with your medicine. What should I watch for while using this medication? Your condition will be monitored carefully while you are receiving this medication. This medication may make you feel generally unwell. This is not uncommon, as chemotherapy can affect healthy cells as well as cancer cells. Report any side effects. Continue your course of treatment even though you feel ill unless your care team tells you to stop. In some cases, you may be given additional medications to help with side effects. Follow all directions for their use. This medication may increase your risk of getting an infection. Call your care team for advice if you get a fever, chills, sore throat, or other  symptoms of a cold or flu. Do not treat yourself. Try to avoid being around people who are sick. This medication may increase your risk to bruise or bleed. Call your care team if you notice any unusual bleeding. Be careful brushing or flossing your teeth or using a toothpick because you may get an infection or bleed more easily. If you have any dental work done, tell your dentist you are receiving this medication. Avoid taking medications that contain aspirin, acetaminophen, ibuprofen, naproxen, or ketoprofen unless instructed by your care team. These medications may hide a fever. Talk to your care team if you or your partner wish to become pregnant or think you might be pregnant. This medication can cause serious birth defects if taken during pregnancy and for 6 months after the last dose. A negative pregnancy test is required before starting this medication. A reliable form of  contraception is recommended while taking this medication and for 6 months after the last dose. Talk to your care team about effective forms of contraception. Do not father a child while taking this medication and for 3 months after the last dose. Use a condom while having sex during this time period. Do not breastfeed while taking this medication and for at least 1 week after the last dose. This medication may cause infertility. Talk to your care team if you are concerned about your fertility. What side effects may I notice from receiving this medication? Side effects that you should report to your care team as soon as possible: Allergic reactions--skin rash, itching, hives, swelling of the face, lips, tongue, or throat Capillary leak syndrome--stomach or muscle pain, unusual weakness or fatigue, feeling faint or lightheaded, decrease in the amount of urine, swelling of the ankles, hands, or feet, trouble breathing Infection--fever, chills, cough, sore throat, wounds that don't heal, pain or trouble when passing urine, general feeling of discomfort or being unwell Liver injury--right upper belly pain, loss of appetite, nausea, light-colored stool, dark yellow or brown urine, yellowing skin or eyes, unusual weakness or fatigue Low red blood cell level--unusual weakness or fatigue, dizziness, headache, trouble breathing Lung injury--shortness of breath or trouble breathing, cough, spitting up blood, chest pain, fever Stomach pain, bloody diarrhea, pale skin, unusual weakness or fatigue, decrease in the amount of urine, which may be signs of hemolytic uremic syndrome Sudden and severe headache, confusion, change in vision, seizures, which may be signs of posterior reversible encephalopathy syndrome (PRES) Unusual bruising or bleeding Side effects that usually do not require medical attention (report to your care team if they continue or are bothersome): Diarrhea Drowsiness Hair loss Nausea Pain,  redness, or swelling with sores inside the mouth or throat Vomiting This list may not describe all possible side effects. Call your doctor for medical advice about side effects. You may report side effects to FDA at 1-800-FDA-1088. Where should I keep my medication? This medication is given in a hospital or clinic. It will not be stored at home. NOTE: This sheet is a summary. It may not cover all possible information. If you have questions about this medicine, talk to your doctor, pharmacist, or health care provider.  2024 Elsevier/Gold Standard (2022-03-20 00:00:00)

## 2023-12-04 NOTE — Progress Notes (Signed)
 Patient Care Team: [name], MD as PCP - General (Family Medicine) Kassie Acquanetta Bradley, MD as Consulting Physician (Pulmonary Disease) Cleotilde Carlin Lenis, OD as Referring Physician (Optometry) Anderson, Dena D, PA-C (Physician Assistant) Lanny Callander, MD as Consulting Physician (Hematology and Oncology) Hanford Powell BRAVO, NP as Nurse Practitioner (Hematology and Oncology)   CHIEF COMPLAINT: Follow up pancreatic cancer   Oncology History  Adenocarcinoma of upper lobe of right lung (HCC)  08/06/2023 Initial Diagnosis   Adenocarcinoma of upper lobe of right lung (HCC)   08/06/2023 Cancer Staging   Staging form: Lung, AJCC 8th Edition - Clinical: Stage IB (cT2a, cN0, cM0) - Signed by Sherrod Sherrod, MD on 08/06/2023   10/14/2023 Genetic Testing   Negative genetic testing on the CancerNext-Expanded+RNAinsight.  The report date is 10/13/2023.  The CancerNext-Expanded gene panel offered by [employer] and includes sequencing and rearrangement analysis for the following 71 genes: AIP, ALK, APC, ATM, BAP1, BARD1, BMPR1A, BRCA1, BRCA2, BRIP1, CDC73, CDH1, CDK4, CDKN1B, CDKN2A, CHEK2, DICER1, FH, FLCN, KIF1B, LZTR1, MAX, MEN1, MET, MLH1, MSH2, MSH6, MUTYH, NF1, NF2, NTHL1, PALB2, PHOX2B, PMS2, POT1, PRKAR1A, PTCH1, PTEN, RAD51C, RAD51D, RB1, RET, SDHA, SDHAF2, SDHB, SDHC, SDHD, SMAD4, SMARCA4, SMARCB1, SMARCE1, STK11, SUFU, TMEM127, TP53, TSC1, TSC2 and VHL (sequencing and deletion/duplication); AXIN2, CTNNA1, EGFR, EGLN1, HOXB13, KIT, MITF, MSH3, PDGFRA, POLD1 and POLE (sequencing only); EPCAM and GREM1 (deletion/duplication only). RNA data is routinely analyzed for use in variant interpretation for all genes.    Pancreatic cancer (HCC)  10/07/2023 Cancer Staging   Staging form: Exocrine Pancreas, AJCC 8th Edition - Clinical stage from 10/07/2023: Stage IB (cT2, cN0, cM0) - Signed by Lanny Callander, MD on 10/25/2023 Total positive nodes: 0   10/25/2023 Initial Diagnosis   Pancreatic cancer  (HCC)   11/25/2023 -  Chemotherapy   [name] : PANCREATIC Abraxane  D1,8,15 + Gemcitabine  D1,8,15 q28d        CURRENT THERAPY: Gemcitabine  and Abraxane  D1, 15 q28 days  INTERVAL HISTORY Tiffany [name] returns for follow up to start chemo for pancreatic cancer. She had chemo class and port placement in the interim. Also saw Dr. Sherrod yesterday. She is doing well, active at home, does her own errands and walks down to the [place] for laundry. Uses a cane due to a fall earlier this year but none recent. She is eating/drinking, bowels moving well. Denies pain, n/v, worsening cough, chest pain, dyspnea.   ROS  All other systems reviewed and negative  Past Medical History:  Diagnosis Date   Abnormal PFT 09/02/2014   moderate restrictive ventilatory defect.   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 Ivin   Colon polyp 12/2003   Diverticulosis 11/2010   seen on colonoscopy and air contrast BE   Emphysema of lung (HCC)    Family history of breast cancer    GERD (gastroesophageal reflux disease)    History of radiation therapy    Right Lung-09/04/23-09/09/23- Dr. Lynwood Nasuti   Hyperlipidemia    Hypertension    previously treated with meds, resolved   MGUS (monoclonal gammopathy of unknown significance) 1999   prevously under care of Dr. 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   Tobacco abuse    quit in 2019     Past Surgical History:  Procedure Laterality Date   BASAL CELL CARCINOMA EXCISION  11/26/2008   L face, R temple   BILATERAL  SALPINGOOPHORECTOMY  11/26/2005   cancerous tumor R ovary   BIOPSY  10/07/2023   Procedure: BIOPSY;  Surgeon: Wilhelmenia Aloha Raddle., MD;  Location: [facility];  Service: Gastroenterology;;   BRONCHIAL BIOPSY  07/09/2023   Procedure: BRONCHIAL BIOPSIES;  Surgeon: Shelah Lamar RAMAN, MD;  Location: [facility] ENDOSCOPY;  Service: Pulmonary;;   BRONCHIAL BRUSHINGS  07/09/2023   Procedure: BRONCHIAL BRUSHINGS;  Surgeon: Shelah Lamar RAMAN, MD;  Location: [facility] ENDOSCOPY;  Service: Pulmonary;;   BRONCHIAL NEEDLE ASPIRATION BIOPSY  07/09/2023   Procedure: BRONCHIAL NEEDLE ASPIRATION BIOPSIES;  Surgeon: Shelah Lamar RAMAN, MD;  Location: [facility] ENDOSCOPY;  Service: Pulmonary;;   CATARACT EXTRACTION  11/27/2007   bilateral   COLONOSCOPY  11/26/2010   Dr. Vicci. Diverticulosis. BE also done--normal   ESOPHAGOGASTRODUODENOSCOPY (EGD) WITH PROPOFOL  N/A 10/07/2023   Procedure: ESOPHAGOGASTRODUODENOSCOPY (EGD) WITH PROPOFOL ;  Surgeon: Wilhelmenia Aloha Raddle., MD;  Location: [facility];  Service: Gastroenterology;  Laterality: N/A;   EUS N/A 10/07/2023   Procedure: UPPER ENDOSCOPIC ULTRASOUND (EUS) RADIAL;  Surgeon: Wilhelmenia Aloha Raddle., MD;  Location: [facility];  Service: Gastroenterology;  Laterality: N/A;   FIDUCIAL MARKER PLACEMENT  07/09/2023   Procedure: FIDUCIAL MARKER PLACEMENT;  Surgeon: Shelah Lamar RAMAN, MD;  Location: [facility] ENDOSCOPY;  Service: Pulmonary;;   FINE NEEDLE ASPIRATION N/A 10/07/2023   Procedure: FINE NEEDLE ASPIRATION (FNA) LINEAR;  Surgeon: Wilhelmenia Aloha Raddle., MD;  Location: [facility];  Service: Gastroenterology;  Laterality: N/A;   OMENTECTOMY  11/26/2005   and pelvic lymphadenectomy (with BSO)   PORTACATH PLACEMENT N/A 11/22/2023   Procedure: INSERTION PORT-A-CATH;  Surgeon: Aron Shoulders, MD;  Location: [facility];  Service: General;  Laterality: N/A;   ROBOTIC ASSISTED LAPAROSCOPIC LYSIS OF ADHESION N/A 07/18/2023   Procedure: ROBOTIC ASSISTED PELVIC MASS EXCISION WITH RADICAL DISSECTION,  MINI LAPAROTOMY, CYSTOSCOPY;  Surgeon: Viktoria Comer SAUNDERS, MD;  Location: [facility];  Service: Gynecology;  Laterality: N/A;   SKIN CANCER EXCISION Left 07/2021   neck; pt can't recall type of cancer   TONSILLECTOMY  age 22   VAGINAL HYSTERECTOMY  late 20's   didn't want any more children     Outpatient Encounter Medications as of 12/04/2023  Medication Sig Note   acetaminophen  (TYLENOL ) 500 MG tablet Take 2 tablets (1,000 mg total) by mouth every 8 (eight) hours as needed for mild pain (pain score 1-3). May use Tylenol  for next 1-3 days if needed for abdominal pain, but otherwise call office if any issues arise 11/21/2023: As needed   albuterol  (VENTOLIN  HFA) 108 (90 Base) MCG/ACT inhaler INHALE 2 PUFFS BY MOUTH EVERY 6 HOURS AS NEEDED FOR WHEEZING FOR SHORTNESS OF BREATH    cholecalciferol (VITAMIN D3) 25 MCG (1000 UNIT) tablet Take 1,000 Units by mouth daily. 11/21/2023: Last used Tuesday night   Coenzyme Q10 (COQ10) 100 MG CAPS Take 100 mg by mouth daily. 11/21/2023: Last used Tuesday night   lidocaine -prilocaine  (EMLA ) cream Apply to affected area once    losartan  (COZAAR ) 50 MG tablet Take 1 tablet (50 mg total) by mouth daily. 11/21/2023: Took tuesday   oxyCODONE  (OXY IR/ROXICODONE ) 5  MG immediate release tablet Take 1 tablet (5 mg total) by mouth every 6 (six) hours as needed for severe pain (pain score 7-10).    Tiotropium Bromide -Olodaterol (STIOLTO RESPIMAT ) 2.5-2.5 MCG/ACT AERS INHALE 2 PUFFS BY MOUTH ONCE DAILY    ondansetron  (ZOFRAN ) 8 MG tablet Take 1 tablet (8 mg total) by mouth every 8 (eight) hours as needed for nausea or vomiting. (Patient not taking: Reported on 11/21/2023)    prochlorperazine  (COMPAZINE ) 10 MG tablet Take 1 tablet (10 mg total) by mouth every 6 (six) hours as needed for nausea or vomiting. (Patient not taking: Reported on 11/21/2023)    rosuvastatin  (CRESTOR ) 10 MG tablet Take 1 tablet (10 mg total) by mouth daily. (Patient taking differently: Take 10 mg by  mouth at bedtime.)    No facility-administered encounter medications on file as of 12/04/2023.     Today's Vitals   12/04/23 0956  BP: (!) 147/72  Pulse: 66  Resp: 17  Temp: 97.9 F (36.6 C)  TempSrc: Temporal  SpO2: 98%  Weight: 139 lb 3.2 oz (63.1 kg)  Height: 5' 5 (1.651 m)   Body mass index is 23.16 kg/m.   PHYSICAL EXAM GENERAL:alert, no distress and comfortable SKIN: no rash  EYES: sclera clear LUNGS: clear with normal breathing effort HEART: regular rate & rhythm, no lower extremity edema ABDOMEN: abdomen soft, non-tender and normal bowel sounds NEURO: alert & oriented x 3 with fluent speech, no focal motor/sensory deficits PAC covered with gauze  CBC    Component Value Date/Time   WBC 5.9 12/04/2023 0932   WBC 8.0 07/19/2023 0415   RBC 4.01 12/04/2023 0932   HGB 12.5 12/04/2023 0932   HGB 12.4 05/08/2023 0953   HCT 36.5 12/04/2023 0932   HCT 38.0 05/08/2023 0953   PLT 161 12/04/2023 0932   PLT 207 05/08/2023 0953   MCV 91.0 12/04/2023 0932   MCV 93 05/08/2023 0953   MCH 31.2 12/04/2023 0932   MCHC 34.2 12/04/2023 0932   RDW 12.7 12/04/2023 0932   RDW 12.2 05/08/2023 0953   LYMPHSABS 0.9 12/04/2023 0932   LYMPHSABS 1.0 05/08/2023 0953   MONOABS 0.5 12/04/2023 0932   EOSABS 0.1 12/04/2023 0932   EOSABS 0.1 05/08/2023 0953   BASOSABS 0.0 12/04/2023 0932   BASOSABS 0.0 05/08/2023 0953     CMP     Component Value Date/Time   NA 139 12/04/2023 0932   NA 137 05/08/2023 0953   K 3.8 12/04/2023 0932   CL 102 12/04/2023 0932   CO2 30 12/04/2023 0932   GLUCOSE 118 (H) 12/04/2023 0932   BUN 14 12/04/2023 0932   BUN 17 05/08/2023 0953   CREATININE 0.64 12/04/2023 0932   CREATININE 0.72 12/02/2017 0945   CALCIUM  9.8 12/04/2023 0932   PROT 7.2 12/04/2023 0932   PROT 7.1 05/08/2023 0953   ALBUMIN 4.1 12/04/2023 0932   ALBUMIN 4.2 05/08/2023 0953   AST 16 12/04/2023 0932   ALT 13 12/04/2023 0932   ALKPHOS 97 12/04/2023 0932   BILITOT 0.6 12/04/2023  0932   GFRNONAA >60 12/04/2023 0932   GFRAA 60 09/26/2020 1245     ASSESSMENT & PLAN: 78 year old female   Pancreatic cancer -Stage IB vs IV (cT2N0Mx) with lung nodules -Lung cancer screening CT 04/29/23 showed suspicious lung nodules.  -PET scan from June 10, 2023 showed a hypermetabolic 3.1 cm mass in the right upper lobe, and hypermetabolic soft tissue fullness in the pancreatic head, and partially solid and cystic 12.4  cm pelvic mass. -Biopsy of the lung mass showed adenocarcinoma, s/p SBRT ([place]) and is followed by Dr. Sherrod  -She underwent surgical resection of the pelvic mass by GYN Dr. Viktoria 06/2023, which confirmed recurrent granulosa cell tumor -EUS 10/07/23 by Dr. Wilhelmenia showed mass in the pancreatic head, T2N0, biopsy showed adenocarcinoma  -Pt has seen by pancreatobiliary surgeon Dr. Aron, who recommends neoadjuvant chemotherapy due to the uncertainty of lung metastasis.  Patient declined surgery. -Repeated CT on November 01, 2023 showed improved treated lung mass, but several new/enlarging lung nodules, concerning for metastatic disease.  -Pt consented to Gem/abraxane . Underwent port placement and chemo ed -She saw Dr. Sherrod 12/03/23 who reviewed her recent imaging and agrees with gem/abraxane , which may help lungs as well if this is related to her primary lung cancer, and to monitor treatment response with CT CAP. He plans to see her back if needed -Tiffany [name] appears well, CBC is normal, she has good PS. Ready to begin gem/abraxane  today at full dose -We again reviewed potential risk/benefit, SE's, and symptom management  -Phone visit next week for tox check, she knows to call sooner if needed -F/up and C1 D15 in 2 weeks    PLAN: -Labs reviewed -Proceed with C1D1 Gem/abraxane  today as scheduled -Phone f/up next week for tox check -F/up with C1 D15 in 2 weeks   All questions were answered. The patient knows to call the clinic with any problems, questions or  concerns. No barriers to learning were detected. I spent 20 minutes counseling the patient face to face. The total time spent in the appointment was 30 minutes and more than 50% was on counseling, review of test results, and coordination of care.   Tallyn Holroyd, NP-C 12/04/2023

## 2023-12-05 ENCOUNTER — Telehealth: Payer: Self-pay

## 2023-12-05 NOTE — Telephone Encounter (Signed)
 Tiffany [name] states that she is doing fine. She is eating, drinking, and urinating well. She knows to call the office at [phone] if  she has any questions or concerns.

## 2023-12-05 NOTE — Telephone Encounter (Signed)
-----   Message from Nurse Currie Paris sent at 12/04/2023  2:05 PM EST ----- Regarding: First time Abraxane/Gemzar. Pt of Feng. First time Abraxane and Gemzar. Pt of Feng. Tolerated well. VSS. Please call to check in with pt when possible, thanks!

## 2023-12-11 ENCOUNTER — Other Ambulatory Visit: Payer: Medicare Other

## 2023-12-11 ENCOUNTER — Telehealth: Payer: Self-pay

## 2023-12-11 ENCOUNTER — Ambulatory Visit: Payer: Medicare Other | Admitting: Hematology

## 2023-12-11 ENCOUNTER — Inpatient Hospital Stay: Payer: Medicare Other | Admitting: Nurse Practitioner

## 2023-12-11 ENCOUNTER — Encounter: Payer: Self-pay | Admitting: Nurse Practitioner

## 2023-12-11 ENCOUNTER — Other Ambulatory Visit: Payer: Self-pay

## 2023-12-11 ENCOUNTER — Other Ambulatory Visit (HOSPITAL_COMMUNITY): Payer: Self-pay

## 2023-12-11 ENCOUNTER — Encounter: Payer: Self-pay | Admitting: Hematology

## 2023-12-11 ENCOUNTER — Ambulatory Visit: Payer: Medicare Other

## 2023-12-11 DIAGNOSIS — C250 Malignant neoplasm of head of pancreas: Secondary | ICD-10-CM | POA: Diagnosis not present

## 2023-12-11 MED ORDER — LIDOCAINE VISCOUS HCL 2 % MT SOLN
5.0000 mL | Freq: Four times a day (QID) | OROMUCOSAL | 0 refills | Status: AC | PRN
  Filled 2023-12-11: qty 280, 14d supply, fill #0

## 2023-12-11 NOTE — Telephone Encounter (Signed)
 Aurora Blowers can you please call in magic mouthwash for this pt, pharmacy is [facility].   Rx sent to [facility] due to [facility] not having the ingredients.  LM for pt with new pharmacy

## 2023-12-11 NOTE — Progress Notes (Signed)
 Patient Care Team: [name], MD as PCP - General (Family Medicine) Quillian Brunt, MD as Consulting Physician (Pulmonary Disease) Marshel Skeeters, OD as Referring Physician (Optometry) Anderson, Dena D, PA-C (Physician Assistant) Sonja [place] Manor, MD as Consulting Physician (Hematology and Oncology) Sharyon Deis, NP as Nurse Practitioner (Hematology and Oncology)   I connected with Tiffany [name] on 12/11/23 at 11:00 AM EST by telephone visit and verified that I am speaking with the correct person using two identifiers.   I discussed the limitations, risks, security and privacy concerns of performing an evaluation and management service by telemedicine and the availability of in-person appointments. I also discussed with the patient that there may be a patient responsible charge related to this service. The patient expressed understanding and agreed to proceed.   Other persons participating in the visit and their role in the encounter: None   Patient's location: Home  Provider's location: [facility] office   Chief Complaint: Follow up pancreatic cancer, chemo toxicity check   Oncology History  Adenocarcinoma of upper lobe of right lung (HCC)  08/06/2023 Initial Diagnosis   Adenocarcinoma of upper lobe of right lung (HCC)   08/06/2023 Cancer Staging   Staging form: Lung, AJCC 8th Edition - Clinical: Stage IB (cT2a, cN0, cM0) - Signed by Marlene Simas, MD on 08/06/2023   10/14/2023 Genetic Testing   Negative genetic testing on the CancerNext-Expanded+RNAinsight.  The report date is 10/13/2023.  The CancerNext-Expanded gene panel offered by [employer] and includes sequencing and rearrangement analysis for the following 71 genes: AIP, ALK, APC, ATM, BAP1, BARD1, BMPR1A, BRCA1, BRCA2, BRIP1, CDC73, CDH1, CDK4, CDKN1B, CDKN2A, CHEK2, DICER1, FH, FLCN, KIF1B, LZTR1, MAX, MEN1, MET, MLH1, MSH2, MSH6, MUTYH, NF1, NF2, NTHL1, PALB2, PHOX2B, PMS2, POT1, PRKAR1A, PTCH1, PTEN,  RAD51C, RAD51D, RB1, RET, SDHA, SDHAF2, SDHB, SDHC, SDHD, SMAD4, SMARCA4, SMARCB1, SMARCE1, STK11, SUFU, TMEM127, TP53, TSC1, TSC2 and VHL (sequencing and deletion/duplication); AXIN2, CTNNA1, EGFR, EGLN1, HOXB13, KIT, MITF, MSH3, PDGFRA, POLD1 and POLE (sequencing only); EPCAM and GREM1 (deletion/duplication only). RNA data is routinely analyzed for use in variant interpretation for all genes.    Pancreatic cancer (HCC)  10/07/2023 Cancer Staging   Staging form: Exocrine Pancreas, AJCC 8th Edition - Clinical stage from 10/07/2023: Stage IB (cT2, cN0, cM0) - Signed by Sonja Lily Lake, MD on 10/25/2023 Total positive nodes: 0   10/25/2023 Initial Diagnosis   Pancreatic cancer (HCC)   12/04/2023 -  Chemotherapy   [name] : PANCREATIC Abraxane  D1,8,15 + Gemcitabine  D1,8,15 q28d        CURRENT THERAPY: Gemcitabine  and Abraxane  D1, 15 q28 days, starting 12/04/2023  INTERVAL HISTORY Tiffany [name] presents for phone f/up tox check after beginning chemo 1/8.  She had mild constipation and nausea without vomiting at first, resolved with medication.  She is able to eat and drink fairly well despite her tongue that feels "burnt," without sores.  She may have lost a pound or 2.  She is out of bed and up at home.  On the day after treatment her "breathing was bad" started using her home oxygen  that day and overnight that is much better.  Denies cough, chest pain, leg edema, calf pain.  She does have itching without a rash.  ROS  All other systems reviewed and negative  Past Medical History:  Diagnosis Date   Abnormal PFT 09/02/2014   moderate restrictive ventilatory defect.   Adenocarcinoma (HCC)    07/09/23 right lung, s/p SBRT; 10/07/23 pancreatic  head lesion   Arthritis    hip   BCC (basal cell carcinoma), face 2010   Dr. Fleurette Humbles   Colon polyp 12/2003   Diverticulosis 11/2010   seen on colonoscopy and air contrast BE   Emphysema of lung (HCC)    Family history of breast cancer    GERD  (gastroesophageal reflux disease)    History of radiation therapy    Right Lung-09/04/23-09/09/23- Dr. Retta Caster   Hyperlipidemia    Hypertension    previously treated with meds, resolved   MGUS (monoclonal gammopathy of unknown significance) 1999   prevously under care of Dr. 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   Tobacco abuse    quit in 2019     Past Surgical History:  Procedure Laterality Date   BASAL CELL CARCINOMA EXCISION  11/26/2008   L face, R temple   BILATERAL SALPINGOOPHORECTOMY  11/26/2005   cancerous tumor R ovary   BIOPSY  10/07/2023   Procedure: BIOPSY;  Surgeon: Normie Becton., MD;  Location: [facility];  Service: Gastroenterology;;   BRONCHIAL BIOPSY  07/09/2023   Procedure: BRONCHIAL BIOPSIES;  Surgeon: Denson Flake, MD;  Location: [facility] ENDOSCOPY;  Service: Pulmonary;;   BRONCHIAL BRUSHINGS  07/09/2023   Procedure: BRONCHIAL BRUSHINGS;  Surgeon: Denson Flake, MD;  Location: [facility] ENDOSCOPY;  Service: Pulmonary;;   BRONCHIAL NEEDLE ASPIRATION BIOPSY  07/09/2023   Procedure: BRONCHIAL NEEDLE ASPIRATION BIOPSIES;  Surgeon: Denson Flake, MD;  Location: [facility] ENDOSCOPY;  Service: Pulmonary;;   CATARACT EXTRACTION  11/27/2007   bilateral   COLONOSCOPY  11/26/2010   Dr. Lincoln Renshaw. Diverticulosis. BE also done--normal   ESOPHAGOGASTRODUODENOSCOPY (EGD) WITH PROPOFOL  N/A 10/07/2023   Procedure: ESOPHAGOGASTRODUODENOSCOPY (EGD) WITH PROPOFOL ;  Surgeon: Brice Campi Albino Alu., MD;  Location: [facility] ENDOSCOPY;  Service: Gastroenterology;  Laterality: N/A;   EUS N/A 10/07/2023   Procedure: UPPER ENDOSCOPIC ULTRASOUND (EUS) RADIAL;  Surgeon: Normie Becton., MD;  Location: [facility] ENDOSCOPY;  Service: Gastroenterology;  Laterality: N/A;   FIDUCIAL MARKER PLACEMENT  07/09/2023   Procedure: FIDUCIAL MARKER PLACEMENT;  Surgeon: Denson Flake, MD;  Location: [facility] ENDOSCOPY;  Service: Pulmonary;;   FINE NEEDLE ASPIRATION N/A 10/07/2023   Procedure: FINE NEEDLE ASPIRATION (FNA) LINEAR;  Surgeon: Normie Becton., MD;  Location: [facility];  Service: Gastroenterology;  Laterality: N/A;   OMENTECTOMY  11/26/2005   and pelvic lymphadenectomy (with BSO)   PORTACATH PLACEMENT N/A 11/22/2023   Procedure: INSERTION PORT-A-CATH;  Surgeon: Lockie Rima, MD;  Location: [facility] OR;  Service: General;  Laterality: N/A;   ROBOTIC ASSISTED LAPAROSCOPIC LYSIS OF ADHESION N/A 07/18/2023   Procedure: ROBOTIC ASSISTED PELVIC MASS EXCISION WITH RADICAL DISSECTION, MINI LAPAROTOMY, CYSTOSCOPY;  Surgeon: Suzi Essex, MD;  Location: [facility] ORS;  Service: Gynecology;  Laterality: N/A;   SKIN CANCER EXCISION Left 07/2021   neck; pt can't recall type of cancer   TONSILLECTOMY  age 43   VAGINAL HYSTERECTOMY  late 20's   "didn't want any more children"     Outpatient Encounter Medications as of 12/11/2023  Medication Sig Note   acetaminophen  (TYLENOL ) 500 MG tablet Take 2 tablets (1,000 mg  total) by mouth every 8 (eight) hours as needed for mild pain (pain score 1-3). May use Tylenol  for next 1-3 days if needed for abdominal pain, but otherwise call office if any issues arise 11/21/2023: As needed   albuterol  (VENTOLIN  HFA) 108 (90 Base) MCG/ACT inhaler INHALE 2 PUFFS BY MOUTH EVERY 6 HOURS AS NEEDED FOR WHEEZING FOR SHORTNESS OF BREATH    cholecalciferol (VITAMIN D3) 25 MCG (1000 UNIT) tablet Take 1,000 Units by mouth daily. 11/21/2023: Last used Tuesday night   Coenzyme Q10 (COQ10) 100 MG CAPS Take 100 mg by mouth daily. 11/21/2023: Last used Tuesday night   lidocaine -prilocaine  (EMLA ) cream Apply to affected area once    losartan  (COZAAR ) 50 MG tablet Take 1  tablet (50 mg total) by mouth daily. 11/21/2023: Took tuesday   ondansetron  (ZOFRAN ) 8 MG tablet Take 1 tablet (8 mg total) by mouth every 8 (eight) hours as needed for nausea or vomiting. (Patient not taking: Reported on 11/21/2023)    oxyCODONE  (OXY IR/ROXICODONE ) 5 MG immediate release tablet Take 1 tablet (5 mg total) by mouth every 6 (six) hours as needed for severe pain (pain score 7-10).    prochlorperazine  (COMPAZINE ) 10 MG tablet Take 1 tablet (10 mg total) by mouth every 6 (six) hours as needed for nausea or vomiting. (Patient not taking: Reported on 11/21/2023)    rosuvastatin  (CRESTOR ) 10 MG tablet Take 1 tablet (10 mg total) by mouth daily. (Patient taking differently: Take 10 mg by mouth at bedtime.)    Tiotropium Bromide -Olodaterol (STIOLTO RESPIMAT ) 2.5-2.5 MCG/ACT AERS INHALE 2 PUFFS BY MOUTH ONCE DAILY    No facility-administered encounter medications on file as of 12/11/2023.     There were no vitals filed for this visit. There is no height or weight on file to calculate BMI.   PHYSICAL EXAM Voice is strong, speech is clear.  Mood/affect appear normal for situation.  No cough or conversational dyspnea  LAB DATA No labs for this visit    ASSESSMENT & PLAN: 78 year old female    Pancreatic cancer -Stage IB vs IV (cT2N0Mx) with lung nodules -Lung cancer screening CT 04/29/23 showed suspicious lung nodules.  -PET scan from June 10, 2023 showed a hypermetabolic 3.1 cm mass in the right upper lobe, and hypermetabolic soft tissue fullness in the pancreatic head, and partially solid and cystic 12.4 cm pelvic mass. -Biopsy of the lung mass showed adenocarcinoma (T2aM0M0) stage Ib, s/p SBRT ([place]) and is followed by Dr. Marguerita Shih  -She underwent surgical resection of the pelvic mass by GYN Dr. Orvil Bland 06/2023, which confirmed recurrent granulosa cell tumor -EUS 10/07/23 by Dr. Brice Campi showed mass in the pancreatic head, T2N0, biopsy showed adenocarcinoma  -Pt has seen by  pancreatobiliary surgeon Dr. Cherlynn Cornfield, who recommends neoadjuvant chemotherapy due to the uncertainty of lung metastasis.  Patient declined surgery. -Repeated CT on November 01, 2023 showed improved treated lung mass, but several new/enlarging lung nodules, concerning for metastatic disease.  -Pt consented to Gem/abraxane . Underwent port placement and chemo ed -She saw Dr. Marguerita Shih 12/03/23 who reviewed her recent imaging and agrees with gem/abraxane , which may help lungs as well if this is related to her primary lung cancer, and to monitor treatment response with CT CAP. He plans to see her back if needed -Began chemo with gem/abraxane  on days 1, 15 q28 days starting 1/8 -Tiffany [name] appears well by phone, tolerated chemo moderately well with mild nausea, constipation, mouth sores, and pruritus without rash. SE's appear adequately managed with supportive  care at home. Will send in magic mouthwash. She will start benadryl and pepcid for itching -No need for in person visit or fluids/supportive care in clinic today -F/up and C1 D15 on 1/22 as scheduled, or sooner if needed    PLAN: -S/p C1 D1 gem/abraxane  on 1/8, tolerating moderately well thus far -Reviewed symptom management for nausea, constipation, pruritus, mucositis -Rx: Magic mouthwash -No need for clinic visit today -F/up and C1 D 15 1/22 as scheduled, or sooner if needed   I discussed the assessment and treatment plan with the patient. The patient was provided an opportunity to ask questions and all were answered. The patient agreed with the plan and demonstrated an understanding of the instructions.   The patient was advised to call back or seek an in-person evaluation if the symptoms worsen or if the condition fails to improve as anticipated. I spent 4 minutes counseling the patient non face to face. The total time spent in the appointment was 10 minutes and more than 50% was on counseling, review of test results, and coordination of care.    Ugochi Henzler, NP-C 12/11/2023

## 2023-12-12 ENCOUNTER — Other Ambulatory Visit (HOSPITAL_COMMUNITY): Payer: Self-pay

## 2023-12-18 ENCOUNTER — Inpatient Hospital Stay: Payer: Medicare Other | Admitting: Nurse Practitioner

## 2023-12-18 ENCOUNTER — Other Ambulatory Visit: Payer: Medicare Other

## 2023-12-18 ENCOUNTER — Inpatient Hospital Stay: Payer: Medicare Other

## 2023-12-18 ENCOUNTER — Encounter: Payer: Self-pay | Admitting: Hematology

## 2023-12-18 ENCOUNTER — Ambulatory Visit: Payer: Medicare Other | Admitting: Hematology

## 2023-12-18 ENCOUNTER — Encounter: Payer: Self-pay | Admitting: Nurse Practitioner

## 2023-12-18 VITALS — BP 135/71 | HR 66 | Resp 18

## 2023-12-18 VITALS — BP 128/59 | HR 70 | Temp 98.2°F | Resp 18 | Ht 65.0 in | Wt 137.6 lb

## 2023-12-18 DIAGNOSIS — M8580 Other specified disorders of bone density and structure, unspecified site: Secondary | ICD-10-CM | POA: Diagnosis not present

## 2023-12-18 DIAGNOSIS — C250 Malignant neoplasm of head of pancreas: Secondary | ICD-10-CM

## 2023-12-18 DIAGNOSIS — Z85828 Personal history of other malignant neoplasm of skin: Secondary | ICD-10-CM | POA: Diagnosis not present

## 2023-12-18 DIAGNOSIS — Z90710 Acquired absence of both cervix and uterus: Secondary | ICD-10-CM | POA: Diagnosis not present

## 2023-12-18 DIAGNOSIS — K5900 Constipation, unspecified: Secondary | ICD-10-CM | POA: Diagnosis not present

## 2023-12-18 DIAGNOSIS — R197 Diarrhea, unspecified: Secondary | ICD-10-CM | POA: Diagnosis not present

## 2023-12-18 DIAGNOSIS — C3411 Malignant neoplasm of upper lobe, right bronchus or lung: Secondary | ICD-10-CM

## 2023-12-18 DIAGNOSIS — Z90722 Acquired absence of ovaries, bilateral: Secondary | ICD-10-CM | POA: Diagnosis not present

## 2023-12-18 DIAGNOSIS — Z860100 Personal history of colon polyps, unspecified: Secondary | ICD-10-CM | POA: Diagnosis not present

## 2023-12-18 DIAGNOSIS — R110 Nausea: Secondary | ICD-10-CM | POA: Diagnosis not present

## 2023-12-18 DIAGNOSIS — Z5111 Encounter for antineoplastic chemotherapy: Secondary | ICD-10-CM | POA: Diagnosis not present

## 2023-12-18 DIAGNOSIS — Z79899 Other long term (current) drug therapy: Secondary | ICD-10-CM | POA: Diagnosis not present

## 2023-12-18 DIAGNOSIS — R112 Nausea with vomiting, unspecified: Secondary | ICD-10-CM | POA: Diagnosis not present

## 2023-12-18 DIAGNOSIS — Z95828 Presence of other vascular implants and grafts: Secondary | ICD-10-CM | POA: Insufficient documentation

## 2023-12-18 DIAGNOSIS — Z79631 Long term (current) use of antimetabolite agent: Secondary | ICD-10-CM | POA: Diagnosis not present

## 2023-12-18 DIAGNOSIS — Z803 Family history of malignant neoplasm of breast: Secondary | ICD-10-CM | POA: Diagnosis not present

## 2023-12-18 DIAGNOSIS — K1379 Other lesions of oral mucosa: Secondary | ICD-10-CM | POA: Diagnosis not present

## 2023-12-18 DIAGNOSIS — Z79633 Long term (current) use of mitotic inhibitor: Secondary | ICD-10-CM | POA: Diagnosis not present

## 2023-12-18 DIAGNOSIS — R1900 Intra-abdominal and pelvic swelling, mass and lump, unspecified site: Secondary | ICD-10-CM | POA: Diagnosis not present

## 2023-12-18 LAB — CBC WITH DIFFERENTIAL (CANCER CENTER ONLY)
Abs Immature Granulocytes: 0.01 10*3/uL (ref 0.00–0.07)
Basophils Absolute: 0 10*3/uL (ref 0.0–0.1)
Basophils Relative: 1 %
Eosinophils Absolute: 0.1 10*3/uL (ref 0.0–0.5)
Eosinophils Relative: 3 %
HCT: 35.4 % — ABNORMAL LOW (ref 36.0–46.0)
Hemoglobin: 11.5 g/dL — ABNORMAL LOW (ref 12.0–15.0)
Immature Granulocytes: 0 %
Lymphocytes Relative: 15 %
Lymphs Abs: 0.6 10*3/uL — ABNORMAL LOW (ref 0.7–4.0)
MCH: 30.4 pg (ref 26.0–34.0)
MCHC: 32.5 g/dL (ref 30.0–36.0)
MCV: 93.7 fL (ref 80.0–100.0)
Monocytes Absolute: 0.6 10*3/uL (ref 0.1–1.0)
Monocytes Relative: 14 %
Neutro Abs: 2.8 10*3/uL (ref 1.7–7.7)
Neutrophils Relative %: 67 %
Platelet Count: 185 10*3/uL (ref 150–400)
RBC: 3.78 MIL/uL — ABNORMAL LOW (ref 3.87–5.11)
RDW: 12.9 % (ref 11.5–15.5)
WBC Count: 4.1 10*3/uL (ref 4.0–10.5)
nRBC: 0 % (ref 0.0–0.2)

## 2023-12-18 LAB — CMP (CANCER CENTER ONLY)
ALT: 18 U/L (ref 0–44)
AST: 19 U/L (ref 15–41)
Albumin: 3.9 g/dL (ref 3.5–5.0)
Alkaline Phosphatase: 87 U/L (ref 38–126)
Anion gap: 4 — ABNORMAL LOW (ref 5–15)
BUN: 11 mg/dL (ref 8–23)
CO2: 30 mmol/L (ref 22–32)
Calcium: 9.1 mg/dL (ref 8.9–10.3)
Chloride: 103 mmol/L (ref 98–111)
Creatinine: 0.66 mg/dL (ref 0.44–1.00)
GFR, Estimated: >60 mL/min (ref >60)
Glucose, Bld: 115 mg/dL — ABNORMAL HIGH (ref 70–99)
Potassium: 4 mmol/L (ref 3.5–5.1)
Sodium: 137 mmol/L (ref 135–145)
Total Bilirubin: 0.5 mg/dL (ref 0.0–1.2)
Total Protein: 6.9 g/dL (ref 6.5–8.1)

## 2023-12-18 MED ORDER — SODIUM CHLORIDE 0.9% FLUSH
10.0000 mL | Freq: Once | INTRAVENOUS | Status: AC
  Administered 2023-12-18: 10 mL

## 2023-12-18 MED ORDER — PROCHLORPERAZINE MALEATE 10 MG PO TABS
10.0000 mg | ORAL_TABLET | Freq: Once | ORAL | Status: AC
  Administered 2023-12-18: 10 mg via ORAL
  Filled 2023-12-18: qty 1

## 2023-12-18 MED ORDER — SODIUM CHLORIDE 0.9 % IV SOLN
1000.0000 mg/m2 | Freq: Once | INTRAVENOUS | Status: AC
  Administered 2023-12-18: 1711 mg via INTRAVENOUS
  Filled 2023-12-18: qty 45

## 2023-12-18 MED ORDER — HEPARIN SOD (PORK) LOCK FLUSH 100 UNIT/ML IV SOLN
500.0000 [IU] | Freq: Once | INTRAVENOUS | Status: AC | PRN
  Administered 2023-12-18: 500 [IU]

## 2023-12-18 MED ORDER — PACLITAXEL PROTEIN-BOUND CHEMO INJECTION 100 MG
125.0000 mg/m2 | Freq: Once | INTRAVENOUS | Status: AC
  Administered 2023-12-18: 200 mg via INTRAVENOUS
  Filled 2023-12-18: qty 40

## 2023-12-18 MED ORDER — SODIUM CHLORIDE 0.9 % IV SOLN: INTRAVENOUS | Status: DC

## 2023-12-18 MED ORDER — SODIUM CHLORIDE 0.9% FLUSH
10.0000 mL | INTRAVENOUS | Status: DC | PRN
  Administered 2023-12-18: 10 mL

## 2023-12-18 NOTE — Progress Notes (Signed)
Patient Care Team: Joselyn Arrow, MD as PCP - General (Family Medicine) Luciano Cutter, MD as Consulting Physician (Pulmonary Disease) Antonietta Barcelona, OD as Referring Physician (Optometry) Clyda Hurdle (Physician Assistant) Malachy Mood, MD as Consulting Physician (Hematology and Oncology) Carlean Jews, NP as Nurse Practitioner (Hematology and Oncology)   CHIEF COMPLAINT: Follow-up pancreatic cancer  Oncology History  Adenocarcinoma of upper lobe of right lung [facility])  08/06/2023 Initial Diagnosis   Adenocarcinoma of upper lobe of right lung (HCC)   08/06/2023 Cancer Staging   Staging form: Lung, AJCC 8th Edition - Clinical: Stage IB (cT2a, cN0, cM0) - Signed by Si Gaul, MD on 08/06/2023   10/14/2023 Genetic Testing   Negative genetic testing on the CancerNext-Expanded+RNAinsight.  The report date is 10/13/2023.  The CancerNext-Expanded gene panel offered by [employer] and includes sequencing and rearrangement analysis for the following 71 genes: AIP, ALK, APC, ATM, BAP1, BARD1, BMPR1A, BRCA1, BRCA2, BRIP1, CDC73, CDH1, CDK4, CDKN1B, CDKN2A, CHEK2, DICER1, FH, FLCN, KIF1B, LZTR1, MAX, MEN1, MET, MLH1, MSH2, MSH6, MUTYH, NF1, NF2, NTHL1, PALB2, PHOX2B, PMS2, POT1, PRKAR1A, PTCH1, PTEN, RAD51C, RAD51D, RB1, RET, SDHA, SDHAF2, SDHB, SDHC, SDHD, SMAD4, SMARCA4, SMARCB1, SMARCE1, STK11, SUFU, TMEM127, TP53, TSC1, TSC2 and VHL (sequencing and deletion/duplication); AXIN2, CTNNA1, EGFR, EGLN1, HOXB13, KIT, MITF, MSH3, PDGFRA, POLD1 and POLE (sequencing only); EPCAM and GREM1 (deletion/duplication only). RNA data is routinely analyzed for use in variant interpretation for all genes.    Pancreatic cancer (HCC)  10/07/2023 Cancer Staging   Staging form: Exocrine Pancreas, AJCC 8th Edition - Clinical stage from 10/07/2023: Stage IB (cT2, cN0, cM0) - Signed by Malachy Mood, MD on 10/25/2023 Total positive nodes: 0   10/25/2023 Initial Diagnosis   Pancreatic cancer  (HCC)   12/04/2023 -  Chemotherapy   [name] : PANCREATIC Abraxane D1,8,15 + Gemcitabine D1,8,15 q28d        CURRENT THERAPY: Gemcitabine and Abraxane D1, 15 q28 days, starting 12/04/2023   INTERVAL HISTORY Ms. Manifold returns for follow-up and treatment as scheduled.  Seen by me 1/8 to begin chemo, then 1/15 for toxicity check where she was doing well with mild constipation, nausea, and mucositis. Anti-emetics and magic mouthwash helped. The next day she had vomiting and diarrhea that lasted overnight but she recovered well. Not sure if she was exposed to a GI virus. She had to use home oxygen on the day of treatment but not subsequently. Denies recurrent shortness of breath, pain, fever, rash, neuropathy, or any other specific complaints.   ROS  All other systems reviewed and negative  Past Medical History:  Diagnosis Date   Abnormal PFT 09/02/2014   moderate restrictive ventilatory defect.   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 Terri Piedra   Colon polyp 12/2003   Diverticulosis 11/2010   seen on colonoscopy and air contrast BE   Emphysema of lung (HCC)    Family history of breast cancer    GERD (gastroesophageal reflux disease)    History of radiation therapy    Right Lung-09/04/23-09/09/23- Dr. Antony Blackbird   Hyperlipidemia    Hypertension    previously treated with meds, resolved   MGUS (monoclonal gammopathy of unknown significance) 1999   prevously under care of Dr. Myrle Sheng   On home oxygen therapy    at [facility]   Osteopenia  DEXA 01/31/2010 at [facility]; 2014 at 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   Tobacco abuse    quit in 2019      Past Surgical History:  Procedure Laterality Date   BASAL CELL CARCINOMA EXCISION  11/26/2008   L face, R temple   BILATERAL SALPINGOOPHORECTOMY  11/26/2005   cancerous tumor R ovary   BIOPSY  10/07/2023   Procedure: BIOPSY;  Surgeon: Lemar Lofty., MD;  Location: [facility] ENDOSCOPY;  Service: Gastroenterology;;   BRONCHIAL BIOPSY  07/09/2023   Procedure: BRONCHIAL BIOPSIES;  Surgeon: Leslye Peer, MD;  Location: [facility] ENDOSCOPY;  Service: Pulmonary;;   BRONCHIAL BRUSHINGS  07/09/2023   Procedure: BRONCHIAL BRUSHINGS;  Surgeon: Leslye Peer, MD;  Location: [facility] ENDOSCOPY;  Service: Pulmonary;;   BRONCHIAL NEEDLE ASPIRATION BIOPSY  07/09/2023   Procedure: BRONCHIAL NEEDLE ASPIRATION BIOPSIES;  Surgeon: Leslye Peer, MD;  Location: [name] ENDOSCOPY;  Service: Pulmonary;;   CATARACT EXTRACTION  11/27/2007   bilateral   COLONOSCOPY  11/26/2010   Dr. Laural Benes. Diverticulosis. BE also done--normal   ESOPHAGOGASTRODUODENOSCOPY (EGD) WITH PROPOFOL N/A 10/07/2023   Procedure: ESOPHAGOGASTRODUODENOSCOPY (EGD) WITH PROPOFOL;  Surgeon: Meridee Score Netty Starring., MD;  Location: [facility] ENDOSCOPY;  Service: Gastroenterology;  Laterality: N/A;   EUS N/A 10/07/2023   Procedure: UPPER ENDOSCOPIC ULTRASOUND (EUS) RADIAL;  Surgeon: Lemar Lofty., MD;  Location: [facility] ENDOSCOPY;  Service: Gastroenterology;  Laterality: N/A;   FIDUCIAL MARKER PLACEMENT  07/09/2023   Procedure: FIDUCIAL MARKER PLACEMENT;  Surgeon: Leslye Peer, MD;  Location: [facility] ENDOSCOPY;  Service: Pulmonary;;   FINE NEEDLE ASPIRATION N/A 10/07/2023   Procedure: FINE NEEDLE ASPIRATION (FNA) LINEAR;  Surgeon: Lemar Lofty., MD;  Location: [facility] ENDOSCOPY;  Service: Gastroenterology;  Laterality: N/A;   OMENTECTOMY  11/26/2005   and pelvic lymphadenectomy (with BSO)   PORTACATH PLACEMENT N/A 11/22/2023   Procedure: INSERTION PORT-A-CATH;  Surgeon: [name], MD;  Location: [facility] OR;  Service: General;  Laterality: N/A;   ROBOTIC  ASSISTED LAPAROSCOPIC LYSIS OF ADHESION N/A 07/18/2023   Procedure: ROBOTIC ASSISTED PELVIC MASS EXCISION WITH RADICAL DISSECTION, MINI LAPAROTOMY, CYSTOSCOPY;  Surgeon: Carver Fila, MD;  Location: [facility] ORS;  Service: Gynecology;  Laterality: N/A;   SKIN CANCER EXCISION Left 07/2021   neck; pt can't recall type of cancer   TONSILLECTOMY  age 56   VAGINAL HYSTERECTOMY  late 20's   "didn't want any more children"     Outpatient Encounter Medications as of 12/18/2023  Medication Sig Note   acetaminophen (TYLENOL) 500 MG tablet Take 2 tablets (1,000 mg total) by mouth every 8 (eight) hours as needed for mild pain (pain score 1-3). May use Tylenol for next 1-3 days if needed for abdominal pain, but otherwise call office if any issues arise 11/21/2023: As needed   albuterol (VENTOLIN HFA) 108 (90 Base) MCG/ACT inhaler INHALE 2 PUFFS BY MOUTH EVERY 6 HOURS AS NEEDED FOR WHEEZING FOR SHORTNESS OF BREATH    cholecalciferol (VITAMIN D3) 25 MCG (1000 UNIT) tablet Take 1,000 Units by mouth daily. 11/21/2023: Last used Tuesday night   Coenzyme Q10 (COQ10) 100 MG CAPS Take 100 mg by mouth daily. 11/21/2023: Last used Tuesday night   lidocaine-prilocaine (EMLA) cream Apply to affected area once    losartan (COZAAR) 50 MG tablet Take  1 tablet (50 mg total) by mouth daily. 11/21/2023: Rochele Pages tuesday   magic mouthwash (nystatin, lidocaine, diphenhydrAMINE, alum & mag hydroxide) suspension Take 5 mLs by mouth 4 (four) times daily as needed for mouth pain.    ondansetron (ZOFRAN) 8 MG tablet Take 1 tablet (8 mg total) by mouth every 8 (eight) hours as needed for nausea or vomiting.    oxyCODONE (OXY IR/ROXICODONE) 5 MG immediate release tablet Take 1 tablet (5 mg total) by mouth every 6 (six) hours as needed for severe pain (pain score 7-10).    prochlorperazine (COMPAZINE) 10 MG tablet Take 1 tablet (10 mg total) by mouth every 6 (six) hours as needed for nausea or vomiting.    Tiotropium Bromide-Olodaterol  (STIOLTO RESPIMAT) 2.5-2.5 MCG/ACT AERS INHALE 2 PUFFS BY MOUTH ONCE DAILY    rosuvastatin (CRESTOR) 10 MG tablet Take 1 tablet (10 mg total) by mouth daily. (Patient taking differently: Take 10 mg by mouth at bedtime.)    No facility-administered encounter medications on file as of 12/18/2023.     Today's Vitals   12/18/23 1042 12/18/23 1046  BP: (!) 128/59   Pulse: 70   Resp: 18   Temp: 98.2 F (36.8 C)   TempSrc: Temporal   SpO2: 94%   Weight: 137 lb 9 oz (62.4 kg)   Height: 5\' 5"  (1.651 m)   PainSc:  0-No pain   Body mass index is 22.89 kg/m.   PHYSICAL EXAM GENERAL:alert, no distress and comfortable SKIN: no rash  EYES: sclera clear NECK: without mass LUNGS: clear with normal breathing effort HEART: regular rate & rhythm, no lower extremity edema ABDOMEN: abdomen soft, non-tender and normal bowel sounds NEURO: alert & oriented x 3 with fluent speech, no focal motor/sensory deficits PAC without erythema    CBC    Component Value Date/Time   WBC 4.1 12/18/2023 1027   WBC 8.0 07/19/2023 0415   RBC 3.78 (L) 12/18/2023 1027   HGB 11.5 (L) 12/18/2023 1027   HGB 12.4 05/08/2023 0953   HCT 35.4 (L) 12/18/2023 1027   HCT 38.0 05/08/2023 0953   PLT 185 12/18/2023 1027   PLT 207 05/08/2023 0953   MCV 93.7 12/18/2023 1027   MCV 93 05/08/2023 0953   MCH 30.4 12/18/2023 1027   MCHC 32.5 12/18/2023 1027   RDW 12.9 12/18/2023 1027   RDW 12.2 05/08/2023 0953   LYMPHSABS 0.6 (L) 12/18/2023 1027   LYMPHSABS 1.0 05/08/2023 0953   MONOABS 0.6 12/18/2023 1027   EOSABS 0.1 12/18/2023 1027   EOSABS 0.1 05/08/2023 0953   BASOSABS 0.0 12/18/2023 1027   BASOSABS 0.0 05/08/2023 0953     CMP     Component Value Date/Time   NA 137 12/18/2023 1027   NA 137 05/08/2023 0953   K 4.0 12/18/2023 1027   CL 103 12/18/2023 1027   CO2 30 12/18/2023 1027   GLUCOSE 115 (H) 12/18/2023 1027   BUN 11 12/18/2023 1027   BUN 17 05/08/2023 0953   CREATININE 0.66 12/18/2023 1027    CREATININE 0.72 12/02/2017 0945   CALCIUM 9.1 12/18/2023 1027   PROT 6.9 12/18/2023 1027   PROT 7.1 05/08/2023 0953   ALBUMIN 3.9 12/18/2023 1027   ALBUMIN 4.2 05/08/2023 0953   AST 19 12/18/2023 1027   ALT 18 12/18/2023 1027   ALKPHOS 87 12/18/2023 1027   BILITOT 0.5 12/18/2023 1027   GFRNONAA >60 12/18/2023 1027   GFRAA 60 09/26/2020 1245     ASSESSMENT & PLAN:78 year old female  Pancreatic cancer -Stage IB vs IV (cT2N0Mx) with lung nodules -Lung cancer screening CT 04/29/23 showed suspicious lung nodules.  -PET scan from June 10, 2023 showed a hypermetabolic 3.1 cm mass in the right upper lobe, and hypermetabolic soft tissue fullness in the pancreatic head, and partially solid and cystic 12.4 cm pelvic mass. -Biopsy of the lung mass showed adenocarcinoma (T2aM0M0) stage Ib, s/p SBRT ([place]) and is followed by Dr. Arbutus Ped  -She underwent surgical resection of the pelvic mass by GYN Dr. Pricilla Holm 06/2023, which confirmed recurrent granulosa cell tumor -EUS 10/07/23 by Dr. Meridee Score showed mass in the pancreatic head, T2N0, biopsy showed adenocarcinoma  -Pt has seen by pancreatobiliary surgeon Dr. Donell Beers, who recommends neoadjuvant chemotherapy due to the uncertainty of lung metastasis.  Patient declined surgery. -Repeated CT on November 01, 2023 showed improved treated lung mass, but several new/enlarging lung nodules, concerning for metastatic disease.  -Pt consented to Gem/abraxane. Underwent port placement and chemo ed -She saw Dr. Arbutus Ped 12/03/23 who reviewed her recent imaging and agrees with gem/abraxane, which may help lungs as well if this is related to her primary lung cancer, and to monitor treatment response with CT CAP. He plans to see her back if needed -Began chemo with gem/abraxane on days 1, 15 q28 days starting 1/8 -Ms. Judice appears stable. S/p C1 D1 full dose gem/abraxane, tolerated well with mild nausea, constipation, mucositis. She then had vomiting and diarrhea but  unclear if chemo-related. She used home O2 on the night of chemo but not subsequently.  -SEs were effectively managed with supportive care at home. She has recovered well, good PS -Labs reviewed, adequate to proceed with C1 D15 Gem/abraxane as scheduled, no dosage adjustments -F/up and C2 D1 in 2 weeks, or sooner if needed    PLAN: -Labs reviewed -Proceed with C1 D15 Gem/abraxane, no dosage adjustments -Continue symptom management for n/v/c/d, mucositis -F/up and C2 D1 in 2 weeks, or sooner if needed    All questions were answered. The patient knows to call the clinic with any problems, questions or concerns. No barriers to learning were detected.   Santiago Glad, NP-C 12/18/2023

## 2023-12-18 NOTE — Patient Instructions (Signed)
[facility] - A DEPT OF [facility]  Discharge Instructions: Thank you for choosing [facility] to provide your oncology and hematology care.   If you have a lab appointment with the Cancer Center, please go directly to the Cancer Center and check in at the registration area.   Wear comfortable clothing and clothing appropriate for easy access to any Portacath or PICC line.   We strive to give you quality time with your provider. You may need to reschedule your appointment if you arrive late (15 or more minutes).  Arriving late affects you and other patients whose appointments are after yours.  Also, if you miss three or more appointments without notifying the office, you may be dismissed from the clinic at the provider's discretion.      For prescription refill requests, have your pharmacy contact our office and allow 72 hours for refills to be completed.    Today you received the following chemotherapy and/or immunotherapy agents Nab-Paclitaxel Protein Bound (Abraxane) and Gemcitabine (Gemzar)   To help prevent nausea and vomiting after your treatment, we encourage you to take your nausea medication as directed.  BELOW ARE SYMPTOMS THAT SHOULD BE REPORTED IMMEDIATELY: *FEVER GREATER THAN 100.4 F (38 C) OR HIGHER *CHILLS OR SWEATING *NAUSEA AND VOMITING THAT IS NOT CONTROLLED WITH YOUR NAUSEA MEDICATION *UNUSUAL SHORTNESS OF BREATH *UNUSUAL BRUISING OR BLEEDING *URINARY PROBLEMS (pain or burning when urinating, or frequent urination) *BOWEL PROBLEMS (unusual diarrhea, constipation, pain near the anus) TENDERNESS IN MOUTH AND THROAT WITH OR WITHOUT PRESENCE OF ULCERS (sore throat, sores in mouth, or a toothache) UNUSUAL RASH, SWELLING OR PAIN  UNUSUAL VAGINAL DISCHARGE OR ITCHING   Items with * indicate a potential emergency and should be followed up as soon as possible or go to the Emergency Department if any problems should occur.  Please  show the CHEMOTHERAPY ALERT CARD or IMMUNOTHERAPY ALERT CARD at check-in to the Emergency Department and triage nurse.  Should you have questions after your visit or need to cancel or reschedule your appointment, please contact [facility] - A DEPT OF [facility]  Dept: [phone]  and follow the prompts.  Office hours are 8:00 a.m. to 4:30 p.m. Monday - Friday. Please note that voicemails left after 4:00 p.m. may not be returned until the following business day.  We are closed weekends and major holidays. You have access to a nurse at all times for urgent questions. Please call the main number to the clinic Dept: [phone] and follow the prompts.   For any non-urgent questions, you may also contact your provider using MyChart. We now offer e-Visits for anyone 72 and older to request care online for non-urgent symptoms. For details visit [email or web address].   Also download the MyChart app! Go to the app store, search "MyChart", open the app, select [place], and log in with your MyChart username and password.  Paclitaxel Nanoparticle Albumin-Bound Injection What is this medication? NANOPARTICLE ALBUMIN-BOUND PACLITAXEL (Na no PAHR ti kuhl al BYOO muhn-bound PAK li TAX el) treats some types of cancer. It works by slowing down the growth of cancer cells. This medicine may be used for other purposes; ask your health care provider or pharmacist if you have questions. COMMON BRAND NAME(S): Abraxane What should I tell my care team before I take this medication? They need to know if you have any of these conditions: Liver disease Low white blood  cell levels An unusual or allergic reaction to paclitaxel, albumin, other medications, foods, dyes, or preservatives If you or your partner are pregnant or trying to get pregnant Breast-feeding How should I use this medication? This medication is injected into a vein. It is given by your care team in a hospital or  clinic setting. Talk to your care team about the use of this medication in children. Special care may be needed. Overdosage: If you think you have taken too much of this medicine contact a poison control center or emergency room at once. NOTE: This medicine is only for you. Do not share this medicine with others. What if I miss a dose? Keep appointments for follow-up doses. It is important not to miss your dose. Call your care team if you are unable to keep an appointment. What may interact with this medication? Other medications may affect the way this medication works. Talk with your care team about all of the medications you take. They may suggest changes to your treatment plan to lower the risk of side effects and to make sure your medications work as intended. This list may not describe all possible interactions. Give your health care provider a list of all the medicines, herbs, non-prescription drugs, or dietary supplements you use. Also tell them if you smoke, drink alcohol, or use illegal drugs. Some items may interact with your medicine. What should I watch for while using this medication? Your condition will be monitored carefully while you are receiving this medication. You may need blood work while taking this medication. This medication may make you feel generally unwell. This is not uncommon as chemotherapy can affect healthy cells as well as cancer cells. Report any side effects. Continue your course of treatment even though you feel ill unless your care team tells you to stop. This medication can cause serious allergic reactions. To reduce the risk, your care team may give you other medications to take before receiving this one. Be sure to follow the directions from your care team. This medication may increase your risk of getting an infection. Call your care team for advice if you get a fever, chills, sore throat, or other symptoms of a cold or flu. Do not treat yourself. Try to avoid  being around people who are sick. This medication may increase your risk to bruise or bleed. Call your care team if you notice any unusual bleeding. Be careful brushing or flossing your teeth or using a toothpick because you may get an infection or bleed more easily. If you have any dental work done, tell your dentist you are receiving this medication. Talk to your care team if you or your partner may be pregnant. Serious birth defects can occur if you take this medication during pregnancy and for 6 months after the last dose. You will need a negative pregnancy test before starting this medication. Contraception is recommended while taking this medication and for 6 months after the last dose. Your care team can help you find the option that works for you. If your partner can get pregnant, use a condom during sex while taking this medication and for 3 months after the last dose. Do not breastfeed while taking this medication and for 2 weeks after the last dose. This medication may cause infertility. Talk to your care team if you are concerned about your fertility. What side effects may I notice from receiving this medication? Side effects that you should report to your care team as soon as  possible: Allergic reactions--skin rash, itching, hives, swelling of the face, lips, tongue, or throat Dry cough, shortness of breath or trouble breathing Infection--fever, chills, cough, sore throat, wounds that don't heal, pain or trouble when passing urine, general feeling of discomfort or being unwell Low red blood cell level--unusual weakness or fatigue, dizziness, headache, trouble breathing Pain, tingling, or numbness in the hands or feet Stomach pain, unusual weakness or fatigue, nausea, vomiting, diarrhea, or fever that lasts longer than expected Unusual bruising or bleeding Side effects that usually do not require medical attention (report to your care team if they continue or are  bothersome): Diarrhea Fatigue Hair loss Loss of appetite Nausea Vomiting This list may not describe all possible side effects. Call your doctor for medical advice about side effects. You may report side effects to FDA at 1-800-FDA-1088. Where should I keep my medication? This medication is given in a hospital or clinic. It will not be stored at home. NOTE: This sheet is a summary. It may not cover all possible information. If you have questions about this medicine, talk to your doctor, pharmacist, or health care provider.  2024 Elsevier/Gold Standard (2022-03-29 00:00:00) Gemcitabine Injection What is this medication? GEMCITABINE (jem SYE ta been) treats some types of cancer. It works by slowing down the growth of cancer cells. This medicine may be used for other purposes; ask your health care provider or pharmacist if you have questions. COMMON BRAND NAME(S): Gemzar, Infugem What should I tell my care team before I take this medication? They need to know if you have any of these conditions: Blood disorders Infection Kidney disease Liver disease Lung or breathing disease, such as asthma or COPD Recent or ongoing radiation therapy An unusual or allergic reaction to gemcitabine, other medications, foods, dyes, or preservatives If you or your partner are pregnant or trying to get pregnant Breast-feeding How should I use this medication? This medication is injected into a vein. It is given by your care team in a hospital or clinic setting. Talk to your care team about the use of this medication in children. Special care may be needed. Overdosage: If you think you have taken too much of this medicine contact a poison control center or emergency room at once. NOTE: This medicine is only for you. Do not share this medicine with others. What if I miss a dose? Keep appointments for follow-up doses. It is important not to miss your dose. Call your care team if you are unable to keep an  appointment. What may interact with this medication? Interactions have not been studied. This list may not describe all possible interactions. Give your health care provider a list of all the medicines, herbs, non-prescription drugs, or dietary supplements you use. Also tell them if you smoke, drink alcohol, or use illegal drugs. Some items may interact with your medicine. What should I watch for while using this medication? Your condition will be monitored carefully while you are receiving this medication. This medication may make you feel generally unwell. This is not uncommon, as chemotherapy can affect healthy cells as well as cancer cells. Report any side effects. Continue your course of treatment even though you feel ill unless your care team tells you to stop. In some cases, you may be given additional medications to help with side effects. Follow all directions for their use. This medication may increase your risk of getting an infection. Call your care team for advice if you get a fever, chills, sore throat, or other  symptoms of a cold or flu. Do not treat yourself. Try to avoid being around people who are sick. This medication may increase your risk to bruise or bleed. Call your care team if you notice any unusual bleeding. Be careful brushing or flossing your teeth or using a toothpick because you may get an infection or bleed more easily. If you have any dental work done, tell your dentist you are receiving this medication. Avoid taking medications that contain aspirin, acetaminophen, ibuprofen, naproxen, or ketoprofen unless instructed by your care team. These medications may hide a fever. Talk to your care team if you or your partner wish to become pregnant or think you might be pregnant. This medication can cause serious birth defects if taken during pregnancy and for 6 months after the last dose. A negative pregnancy test is required before starting this medication. A reliable form of  contraception is recommended while taking this medication and for 6 months after the last dose. Talk to your care team about effective forms of contraception. Do not father a child while taking this medication and for 3 months after the last dose. Use a condom while having sex during this time period. Do not breastfeed while taking this medication and for at least 1 week after the last dose. This medication may cause infertility. Talk to your care team if you are concerned about your fertility. What side effects may I notice from receiving this medication? Side effects that you should report to your care team as soon as possible: Allergic reactions--skin rash, itching, hives, swelling of the face, lips, tongue, or throat Capillary leak syndrome--stomach or muscle pain, unusual weakness or fatigue, feeling faint or lightheaded, decrease in the amount of urine, swelling of the ankles, hands, or feet, trouble breathing Infection--fever, chills, cough, sore throat, wounds that don't heal, pain or trouble when passing urine, general feeling of discomfort or being unwell Liver injury--right upper belly pain, loss of appetite, nausea, light-colored stool, dark yellow or brown urine, yellowing skin or eyes, unusual weakness or fatigue Low red blood cell level--unusual weakness or fatigue, dizziness, headache, trouble breathing Lung injury--shortness of breath or trouble breathing, cough, spitting up blood, chest pain, fever Stomach pain, bloody diarrhea, pale skin, unusual weakness or fatigue, decrease in the amount of urine, which may be signs of hemolytic uremic syndrome Sudden and severe headache, confusion, change in vision, seizures, which may be signs of posterior reversible encephalopathy syndrome (PRES) Unusual bruising or bleeding Side effects that usually do not require medical attention (report to your care team if they continue or are bothersome): Diarrhea Drowsiness Hair loss Nausea Pain,  redness, or swelling with sores inside the mouth or throat Vomiting This list may not describe all possible side effects. Call your doctor for medical advice about side effects. You may report side effects to FDA at 1-800-FDA-1088. Where should I keep my medication? This medication is given in a hospital or clinic. It will not be stored at home. NOTE: This sheet is a summary. It may not cover all possible information. If you have questions about this medicine, talk to your doctor, pharmacist, or health care provider.  2024 Elsevier/Gold Standard (2022-03-20 00:00:00)

## 2023-12-19 ENCOUNTER — Encounter: Payer: Self-pay | Admitting: Hematology

## 2023-12-26 ENCOUNTER — Other Ambulatory Visit (HOSPITAL_BASED_OUTPATIENT_CLINIC_OR_DEPARTMENT_OTHER): Payer: Self-pay | Admitting: Pulmonary Disease

## 2023-12-31 NOTE — Assessment & Plan Note (Signed)
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  -PET scan from June 10, 2023 showed a hypermetabolic 3.1 cm mass in the right upper lobe, and hypermetabolic soft tissue fullness in the pancreatic head, and partially solid and cystic 12.4 cm pelvic mass. 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 Pricilla Holm  -baseline CA19.9 33 -Pt has seen by pancreatobiliary surgeon Dr. Donell Beers, who recommends neoadjuvant chemotherapy due to the uncertainty of lung metastasis.  Patient declined surgery. -Repeated CT on November 01, 2023 showed improved treated lung mass, but several new or enlarging lung nodules, concerning for metastatic disease.  -Patient is agreeable with chemo, she started gemcitabine and Abraxane on 12/04/2023

## 2024-01-01 ENCOUNTER — Inpatient Hospital Stay: Payer: Medicare Other | Attending: Gynecologic Oncology

## 2024-01-01 ENCOUNTER — Other Ambulatory Visit: Payer: Medicare Other

## 2024-01-01 ENCOUNTER — Encounter: Payer: Self-pay | Admitting: Hematology

## 2024-01-01 ENCOUNTER — Inpatient Hospital Stay: Payer: Medicare Other | Admitting: Hematology

## 2024-01-01 ENCOUNTER — Inpatient Hospital Stay: Payer: Medicare Other

## 2024-01-01 VITALS — BP 127/58 | HR 73 | Temp 97.5°F | Resp 18 | Wt 137.1 lb

## 2024-01-01 VITALS — BP 140/69 | HR 71 | Resp 18

## 2024-01-01 DIAGNOSIS — Z79899 Other long term (current) drug therapy: Secondary | ICD-10-CM | POA: Insufficient documentation

## 2024-01-01 DIAGNOSIS — Z79633 Long term (current) use of mitotic inhibitor: Secondary | ICD-10-CM | POA: Insufficient documentation

## 2024-01-01 DIAGNOSIS — Z923 Personal history of irradiation: Secondary | ICD-10-CM | POA: Diagnosis not present

## 2024-01-01 DIAGNOSIS — C3411 Malignant neoplasm of upper lobe, right bronchus or lung: Secondary | ICD-10-CM | POA: Insufficient documentation

## 2024-01-01 DIAGNOSIS — M8580 Other specified disorders of bone density and structure, unspecified site: Secondary | ICD-10-CM | POA: Diagnosis not present

## 2024-01-01 DIAGNOSIS — D649 Anemia, unspecified: Secondary | ICD-10-CM | POA: Diagnosis not present

## 2024-01-01 DIAGNOSIS — Z90722 Acquired absence of ovaries, bilateral: Secondary | ICD-10-CM | POA: Insufficient documentation

## 2024-01-01 DIAGNOSIS — Z860100 Personal history of colon polyps, unspecified: Secondary | ICD-10-CM | POA: Diagnosis not present

## 2024-01-01 DIAGNOSIS — Z85828 Personal history of other malignant neoplasm of skin: Secondary | ICD-10-CM | POA: Insufficient documentation

## 2024-01-01 DIAGNOSIS — I10 Essential (primary) hypertension: Secondary | ICD-10-CM | POA: Diagnosis not present

## 2024-01-01 DIAGNOSIS — Z5111 Encounter for antineoplastic chemotherapy: Secondary | ICD-10-CM | POA: Insufficient documentation

## 2024-01-01 DIAGNOSIS — Z79631 Long term (current) use of antimetabolite agent: Secondary | ICD-10-CM | POA: Insufficient documentation

## 2024-01-01 DIAGNOSIS — C250 Malignant neoplasm of head of pancreas: Secondary | ICD-10-CM

## 2024-01-01 DIAGNOSIS — Z90710 Acquired absence of both cervix and uterus: Secondary | ICD-10-CM | POA: Insufficient documentation

## 2024-01-01 DIAGNOSIS — Z95828 Presence of other vascular implants and grafts: Secondary | ICD-10-CM

## 2024-01-01 LAB — CMP (CANCER CENTER ONLY)
ALT: 17 U/L (ref 0–44)
AST: 17 U/L (ref 15–41)
Albumin: 3.9 g/dL (ref 3.5–5.0)
Alkaline Phosphatase: 89 U/L (ref 38–126)
Anion gap: 6 (ref 5–15)
BUN: 13 mg/dL (ref 8–23)
CO2: 31 mmol/L (ref 22–32)
Calcium: 9.1 mg/dL (ref 8.9–10.3)
Chloride: 102 mmol/L (ref 98–111)
Creatinine: 0.63 mg/dL (ref 0.44–1.00)
GFR, Estimated: >60 mL/min (ref >60)
Glucose, Bld: 92 mg/dL (ref 70–99)
Potassium: 3.7 mmol/L (ref 3.5–5.1)
Sodium: 139 mmol/L (ref 135–145)
Total Bilirubin: 0.6 mg/dL (ref 0.0–1.2)
Total Protein: 6.9 g/dL (ref 6.5–8.1)

## 2024-01-01 LAB — CBC WITH DIFFERENTIAL (CANCER CENTER ONLY)
Abs Immature Granulocytes: 0.01 10*3/uL (ref 0.00–0.07)
Basophils Absolute: 0 10*3/uL (ref 0.0–0.1)
Basophils Relative: 0 %
Eosinophils Absolute: 0.1 10*3/uL (ref 0.0–0.5)
Eosinophils Relative: 1 %
HCT: 34.3 % — ABNORMAL LOW (ref 36.0–46.0)
Hemoglobin: 11.2 g/dL — ABNORMAL LOW (ref 12.0–15.0)
Immature Granulocytes: 0 %
Lymphocytes Relative: 15 %
Lymphs Abs: 0.8 10*3/uL (ref 0.7–4.0)
MCH: 30.9 pg (ref 26.0–34.0)
MCHC: 32.7 g/dL (ref 30.0–36.0)
MCV: 94.5 fL (ref 80.0–100.0)
Monocytes Absolute: 0.8 10*3/uL (ref 0.1–1.0)
Monocytes Relative: 15 %
Neutro Abs: 3.5 10*3/uL (ref 1.7–7.7)
Neutrophils Relative %: 69 %
Platelet Count: 151 10*3/uL (ref 150–400)
RBC: 3.63 MIL/uL — ABNORMAL LOW (ref 3.87–5.11)
RDW: 13.9 % (ref 11.5–15.5)
WBC Count: 5.2 10*3/uL (ref 4.0–10.5)
nRBC: 0 % (ref 0.0–0.2)

## 2024-01-01 MED ORDER — SODIUM CHLORIDE 0.9% FLUSH: 10.0000 mL | INTRAVENOUS | Status: DC | PRN

## 2024-01-01 MED ORDER — PACLITAXEL PROTEIN-BOUND CHEMO INJECTION 100 MG
125.0000 mg/m2 | Freq: Once | INTRAVENOUS | Status: AC
  Administered 2024-01-01: 200 mg via INTRAVENOUS
  Filled 2024-01-01: qty 40

## 2024-01-01 MED ORDER — ALBUTEROL SULFATE HFA 108 (90 BASE) MCG/ACT IN AERS: 2.0000 | INHALATION_SPRAY | RESPIRATORY_TRACT | 0 refills | Status: DC | PRN

## 2024-01-01 MED ORDER — SODIUM CHLORIDE 0.9 % IV SOLN: INTRAVENOUS | Status: DC

## 2024-01-01 MED ORDER — PROCHLORPERAZINE MALEATE 10 MG PO TABS
10.0000 mg | ORAL_TABLET | Freq: Once | ORAL | Status: AC
  Administered 2024-01-01: 10 mg via ORAL
  Filled 2024-01-01: qty 1

## 2024-01-01 MED ORDER — SODIUM CHLORIDE 0.9 % IV SOLN
1000.0000 mg/m2 | Freq: Once | INTRAVENOUS | Status: AC
  Administered 2024-01-01: 1711 mg via INTRAVENOUS
  Filled 2024-01-01: qty 45

## 2024-01-01 MED ORDER — SODIUM CHLORIDE 0.9% FLUSH
10.0000 mL | Freq: Once | INTRAVENOUS | Status: AC
Start: 2024-01-01 — End: 2024-01-01
  Administered 2024-01-01: 10 mL

## 2024-01-01 MED ORDER — HEPARIN SOD (PORK) LOCK FLUSH 100 UNIT/ML IV SOLN: 500.0000 [IU] | Freq: Once | INTRAVENOUS | Status: DC | PRN

## 2024-01-01 NOTE — Patient Instructions (Signed)
 [facility] - A DEPT OF [facility]  Discharge Instructions: Thank you for choosing [facility] to provide your oncology and hematology care.   If you have a lab appointment with the Cancer Center, please go directly to the Cancer Center and check in at the registration area.   Wear comfortable clothing and clothing appropriate for easy access to any Portacath or PICC line.   We strive to give you quality time with your provider. You may need to reschedule your appointment if you arrive late (15 or more minutes).  Arriving late affects you and other patients whose appointments are after yours.  Also, if you miss three or more appointments without notifying the office, you may be dismissed from the clinic at the provider's discretion.      For prescription refill requests, have your pharmacy contact our office and allow 72 hours for refills to be completed.    Today you received the following chemotherapy and/or immunotherapy agents Nab-Paclitaxel Protein Bound (Abraxane) and Gemcitabine (Gemzar)   To help prevent nausea and vomiting after your treatment, we encourage you to take your nausea medication as directed.  BELOW ARE SYMPTOMS THAT SHOULD BE REPORTED IMMEDIATELY: *FEVER GREATER THAN 100.4 F (38 C) OR HIGHER *CHILLS OR SWEATING *NAUSEA AND VOMITING THAT IS NOT CONTROLLED WITH YOUR NAUSEA MEDICATION *UNUSUAL SHORTNESS OF BREATH *UNUSUAL BRUISING OR BLEEDING *URINARY PROBLEMS (pain or burning when urinating, or frequent urination) *BOWEL PROBLEMS (unusual diarrhea, constipation, pain near the anus) TENDERNESS IN MOUTH AND THROAT WITH OR WITHOUT PRESENCE OF ULCERS (sore throat, sores in mouth, or a toothache) UNUSUAL RASH, SWELLING OR PAIN  UNUSUAL VAGINAL DISCHARGE OR ITCHING   Items with * indicate a potential emergency and should be followed up as soon as possible or go to the Emergency Department if any problems should occur.  Please  show the CHEMOTHERAPY ALERT CARD or IMMUNOTHERAPY ALERT CARD at check-in to the Emergency Department and triage nurse.  Should you have questions after your visit or need to cancel or reschedule your appointment, please contact [facility] - A DEPT OF [facility]  Dept: [phone]  and follow the prompts.  Office hours are 8:00 a.m. to 4:30 p.m. Monday - Friday. Please note that voicemails left after 4:00 p.m. may not be returned until the following business day.  We are closed weekends and major holidays. You have access to a nurse at all times for urgent questions. Please call the main number to the clinic Dept: [phone] and follow the prompts.   For any non-urgent questions, you may also contact your provider using MyChart. We now offer e-Visits for anyone 72 and older to request care online for non-urgent symptoms. For details visit [email or web address].   Also download the MyChart app! Go to the app store, search "MyChart", open the app, select [place], and log in with your MyChart username and password.  Paclitaxel Nanoparticle Albumin-Bound Injection What is this medication? NANOPARTICLE ALBUMIN-BOUND PACLITAXEL (Na no PAHR ti kuhl al BYOO muhn-bound PAK li TAX el) treats some types of cancer. It works by slowing down the growth of cancer cells. This medicine may be used for other purposes; ask your health care provider or pharmacist if you have questions. COMMON BRAND NAME(S): Abraxane What should I tell my care team before I take this medication? They need to know if you have any of these conditions: Liver disease Low white blood  cell levels An unusual or allergic reaction to paclitaxel, albumin, other medications, foods, dyes, or preservatives If you or your partner are pregnant or trying to get pregnant Breast-feeding How should I use this medication? This medication is injected into a vein. It is given by your care team in a hospital or  clinic setting. Talk to your care team about the use of this medication in children. Special care may be needed. Overdosage: If you think you have taken too much of this medicine contact a poison control center or emergency room at once. NOTE: This medicine is only for you. Do not share this medicine with others. What if I miss a dose? Keep appointments for follow-up doses. It is important not to miss your dose. Call your care team if you are unable to keep an appointment. What may interact with this medication? Other medications may affect the way this medication works. Talk with your care team about all of the medications you take. They may suggest changes to your treatment plan to lower the risk of side effects and to make sure your medications work as intended. This list may not describe all possible interactions. Give your health care provider a list of all the medicines, herbs, non-prescription drugs, or dietary supplements you use. Also tell them if you smoke, drink alcohol, or use illegal drugs. Some items may interact with your medicine. What should I watch for while using this medication? Your condition will be monitored carefully while you are receiving this medication. You may need blood work while taking this medication. This medication may make you feel generally unwell. This is not uncommon as chemotherapy can affect healthy cells as well as cancer cells. Report any side effects. Continue your course of treatment even though you feel ill unless your care team tells you to stop. This medication can cause serious allergic reactions. To reduce the risk, your care team may give you other medications to take before receiving this one. Be sure to follow the directions from your care team. This medication may increase your risk of getting an infection. Call your care team for advice if you get a fever, chills, sore throat, or other symptoms of a cold or flu. Do not treat yourself. Try to avoid  being around people who are sick. This medication may increase your risk to bruise or bleed. Call your care team if you notice any unusual bleeding. Be careful brushing or flossing your teeth or using a toothpick because you may get an infection or bleed more easily. If you have any dental work done, tell your dentist you are receiving this medication. Talk to your care team if you or your partner may be pregnant. Serious birth defects can occur if you take this medication during pregnancy and for 6 months after the last dose. You will need a negative pregnancy test before starting this medication. Contraception is recommended while taking this medication and for 6 months after the last dose. Your care team can help you find the option that works for you. If your partner can get pregnant, use a condom during sex while taking this medication and for 3 months after the last dose. Do not breastfeed while taking this medication and for 2 weeks after the last dose. This medication may cause infertility. Talk to your care team if you are concerned about your fertility. What side effects may I notice from receiving this medication? Side effects that you should report to your care team as soon as  possible: Allergic reactions--skin rash, itching, hives, swelling of the face, lips, tongue, or throat Dry cough, shortness of breath or trouble breathing Infection--fever, chills, cough, sore throat, wounds that don't heal, pain or trouble when passing urine, general feeling of discomfort or being unwell Low red blood cell level--unusual weakness or fatigue, dizziness, headache, trouble breathing Pain, tingling, or numbness in the hands or feet Stomach pain, unusual weakness or fatigue, nausea, vomiting, diarrhea, or fever that lasts longer than expected Unusual bruising or bleeding Side effects that usually do not require medical attention (report to your care team if they continue or are  bothersome): Diarrhea Fatigue Hair loss Loss of appetite Nausea Vomiting This list may not describe all possible side effects. Call your doctor for medical advice about side effects. You may report side effects to FDA at 1-800-FDA-1088. Where should I keep my medication? This medication is given in a hospital or clinic. It will not be stored at home. NOTE: This sheet is a summary. It may not cover all possible information. If you have questions about this medicine, talk to your doctor, pharmacist, or health care provider.  2024 Elsevier/Gold Standard (2022-03-29 00:00:00) Gemcitabine Injection What is this medication? GEMCITABINE (jem SYE ta been) treats some types of cancer. It works by slowing down the growth of cancer cells. This medicine may be used for other purposes; ask your health care provider or pharmacist if you have questions. COMMON BRAND NAME(S): Gemzar, Infugem What should I tell my care team before I take this medication? They need to know if you have any of these conditions: Blood disorders Infection Kidney disease Liver disease Lung or breathing disease, such as asthma or COPD Recent or ongoing radiation therapy An unusual or allergic reaction to gemcitabine, other medications, foods, dyes, or preservatives If you or your partner are pregnant or trying to get pregnant Breast-feeding How should I use this medication? This medication is injected into a vein. It is given by your care team in a hospital or clinic setting. Talk to your care team about the use of this medication in children. Special care may be needed. Overdosage: If you think you have taken too much of this medicine contact a poison control center or emergency room at once. NOTE: This medicine is only for you. Do not share this medicine with others. What if I miss a dose? Keep appointments for follow-up doses. It is important not to miss your dose. Call your care team if you are unable to keep an  appointment. What may interact with this medication? Interactions have not been studied. This list may not describe all possible interactions. Give your health care provider a list of all the medicines, herbs, non-prescription drugs, or dietary supplements you use. Also tell them if you smoke, drink alcohol, or use illegal drugs. Some items may interact with your medicine. What should I watch for while using this medication? Your condition will be monitored carefully while you are receiving this medication. This medication may make you feel generally unwell. This is not uncommon, as chemotherapy can affect healthy cells as well as cancer cells. Report any side effects. Continue your course of treatment even though you feel ill unless your care team tells you to stop. In some cases, you may be given additional medications to help with side effects. Follow all directions for their use. This medication may increase your risk of getting an infection. Call your care team for advice if you get a fever, chills, sore throat, or other  symptoms of a cold or flu. Do not treat yourself. Try to avoid being around people who are sick. This medication may increase your risk to bruise or bleed. Call your care team if you notice any unusual bleeding. Be careful brushing or flossing your teeth or using a toothpick because you may get an infection or bleed more easily. If you have any dental work done, tell your dentist you are receiving this medication. Avoid taking medications that contain aspirin, acetaminophen, ibuprofen, naproxen, or ketoprofen unless instructed by your care team. These medications may hide a fever. Talk to your care team if you or your partner wish to become pregnant or think you might be pregnant. This medication can cause serious birth defects if taken during pregnancy and for 6 months after the last dose. A negative pregnancy test is required before starting this medication. A reliable form of  contraception is recommended while taking this medication and for 6 months after the last dose. Talk to your care team about effective forms of contraception. Do not father a child while taking this medication and for 3 months after the last dose. Use a condom while having sex during this time period. Do not breastfeed while taking this medication and for at least 1 week after the last dose. This medication may cause infertility. Talk to your care team if you are concerned about your fertility. What side effects may I notice from receiving this medication? Side effects that you should report to your care team as soon as possible: Allergic reactions--skin rash, itching, hives, swelling of the face, lips, tongue, or throat Capillary leak syndrome--stomach or muscle pain, unusual weakness or fatigue, feeling faint or lightheaded, decrease in the amount of urine, swelling of the ankles, hands, or feet, trouble breathing Infection--fever, chills, cough, sore throat, wounds that don't heal, pain or trouble when passing urine, general feeling of discomfort or being unwell Liver injury--right upper belly pain, loss of appetite, nausea, light-colored stool, dark yellow or brown urine, yellowing skin or eyes, unusual weakness or fatigue Low red blood cell level--unusual weakness or fatigue, dizziness, headache, trouble breathing Lung injury--shortness of breath or trouble breathing, cough, spitting up blood, chest pain, fever Stomach pain, bloody diarrhea, pale skin, unusual weakness or fatigue, decrease in the amount of urine, which may be signs of hemolytic uremic syndrome Sudden and severe headache, confusion, change in vision, seizures, which may be signs of posterior reversible encephalopathy syndrome (PRES) Unusual bruising or bleeding Side effects that usually do not require medical attention (report to your care team if they continue or are bothersome): Diarrhea Drowsiness Hair loss Nausea Pain,  redness, or swelling with sores inside the mouth or throat Vomiting This list may not describe all possible side effects. Call your doctor for medical advice about side effects. You may report side effects to FDA at 1-800-FDA-1088. Where should I keep my medication? This medication is given in a hospital or clinic. It will not be stored at home. NOTE: This sheet is a summary. It may not cover all possible information. If you have questions about this medicine, talk to your doctor, pharmacist, or health care provider.  2024 Elsevier/Gold Standard (2022-03-20 00:00:00)

## 2024-01-01 NOTE — Progress Notes (Signed)
 [facility]   Telephone:([phone]) [phone] [phone]   Clinic Follow up Note   Patient Care Team: [name], MD as PCP - General (Family Medicine) Kassie Acquanetta Bradley, MD as Consulting Physician (Pulmonary Disease) Cleotilde Carlin Lenis, OD as Referring Physician (Optometry) Anderson, Dena D, PA-C (Physician Assistant) Lanny Callander, MD as Consulting Physician (Hematology and Oncology) Hanford Powell BRAVO, NP as Nurse Practitioner (Hematology and Oncology)  Date of Service:  01/01/2024  CHIEF COMPLAINT: f/u of pancreatic cancer  CURRENT THERAPY:  Gemcitabine  and Abraxane  every 2 weeks  Oncology History   Pancreatic cancer (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  -PET scan from June 10, 2023 showed a hypermetabolic 3.1 cm mass in the right upper lobe, and hypermetabolic soft tissue fullness in the pancreatic head, and partially solid and cystic 12.4 cm pelvic mass. 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 Viktoria  -baseline CA19.9 33 -Pt has seen by pancreatobiliary surgeon Dr. Aron, who recommends neoadjuvant chemotherapy due to the uncertainty of lung metastasis.  Patient declined surgery. -Repeated CT on November 01, 2023 showed improved treated lung mass, but several new or enlarging lung nodules, concerning for metastatic disease.  -Patient is agreeable with chemo, she started gemcitabine  and Abraxane  on 12/04/2023   Assessment and Plan    Pancreatic Cancer 78 year old female with pancreatic cancer, currently undergoing chemotherapy. She has completed two cycles and is here for her third cycle. Reports mild nausea with the first treatment but tolerable side effects overall. Blood tests show mild anemia, consistent with previous results. No  fever, chills, swelling, or bleeding reported. Weight is stable, and she is able to perform daily activities. Discussed plan to repeat scan after three months of treatment, likely in early April. Discussed potential radiation therapy with Dr. Burlene, including fiducial marker placement. Surgery is not planned at this time. - Proceed with chemotherapy treatment - Order scan after cycle three, scheduled for the end of March - Schedule follow-up appointment in two weeks for the next chemotherapy cycle - Discuss potential radiation therapy with Dr. Burlene  Mild Anemia Mild anemia noted on blood tests, consistent with previous results. No new symptoms reported. - Monitor blood counts regularly  Albuterol  Refill Requires refill of albuterol  inhaler, previously prescribed by Dr. Kassie. Advised to contact primary care physician for future refills. - Refill albuterol  inhaler one time - Advise to contact primary care physician for future refills  General Health Maintenance Taking blood pressure medication, vitamin D , and has adequate nausea medication at home. Does not require oxycodone  due to adverse effects. - Continue current medications as prescribed  Plan -Lab reviewed, adequate for treatment, will proceed with treatment today at same dose and continue every 2 weeks - Next chemotherapy cycle scheduled for January 15, 2024 -Plan to repeat CT scan in 7 weeks  - Follow-up appointment with Dr. Burlene to discuss potential radiation therapy after 3 to 4 months of chemotherapy.         SUMMARY OF ONCOLOGIC HISTORY: Oncology History  Adenocarcinoma of upper lobe of right lung (HCC)  08/06/2023 Initial Diagnosis   Adenocarcinoma of upper lobe of right lung (HCC)   08/06/2023 Cancer Staging   Staging form: Lung, AJCC 8th Edition - Clinical: Stage IB (cT2a, cN0, cM0) - Signed by Sherrod Sherrod, MD  on 08/06/2023   10/14/2023 Genetic Testing   Negative genetic testing on the  CancerNext-Expanded+RNAinsight.  The report date is 10/13/2023.  The CancerNext-Expanded gene panel offered by [employer] and includes sequencing and rearrangement analysis for the following 71 genes: AIP, ALK, APC, ATM, BAP1, BARD1, BMPR1A, BRCA1, BRCA2, BRIP1, CDC73, CDH1, CDK4, CDKN1B, CDKN2A, CHEK2, DICER1, FH, FLCN, KIF1B, LZTR1, MAX, MEN1, MET, MLH1, MSH2, MSH6, MUTYH, NF1, NF2, NTHL1, PALB2, PHOX2B, PMS2, POT1, PRKAR1A, PTCH1, PTEN, RAD51C, RAD51D, RB1, RET, SDHA, SDHAF2, SDHB, SDHC, SDHD, SMAD4, SMARCA4, SMARCB1, SMARCE1, STK11, SUFU, TMEM127, TP53, TSC1, TSC2 and VHL (sequencing and deletion/duplication); AXIN2, CTNNA1, EGFR, EGLN1, HOXB13, KIT, MITF, MSH3, PDGFRA, POLD1 and POLE (sequencing only); EPCAM and GREM1 (deletion/duplication only). RNA data is routinely analyzed for use in variant interpretation for all genes.    Pancreatic cancer (HCC)  10/07/2023 Cancer Staging   Staging form: Exocrine Pancreas, AJCC 8th Edition - Clinical stage from 10/07/2023: Stage IB (cT2, cN0, cM0) - Signed by Lanny Callander, MD on 10/25/2023 Total positive nodes: 0   10/25/2023 Initial Diagnosis   Pancreatic cancer (HCC)   12/04/2023 -  Chemotherapy   [name] : PANCREATIC Abraxane  D1,8,15 + Gemcitabine  D1,8,15 q28d        Discussed the use of AI scribe software for clinical note transcription with the patient, who gave verbal consent to proceed.  History of Present Illness   The patient, a 78 year old female with a history of pancreatic cancer, presents for a follow-up visit after undergoing chemotherapy. She reports mild nausea after the second round of chemotherapy but overall tolerating the treatment well. She has been eating well and her weight has remained stable. She is able to perform daily activities independently and live with her husband. She denies any fever or chills. She also reports running out of albuterol , which was prescribed by her previous lung doctor. She has  been trying to get a refill through her pharmacist but has not received a response from the doctor.         All other systems were reviewed with the patient and are negative.  MEDICAL HISTORY:  Past Medical History:  Diagnosis Date   Abnormal PFT 09/02/2014   moderate restrictive ventilatory defect.   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 Ivin   Colon polyp 12/2003   Diverticulosis 11/2010   seen on colonoscopy and air contrast BE   Emphysema of lung (HCC)    Family history of breast cancer    GERD (gastroesophageal reflux disease)    History of radiation therapy    Right Lung-09/04/23-09/09/23- Dr. Lynwood Nasuti   Hyperlipidemia    Hypertension    previously treated with meds, resolved   MGUS (monoclonal gammopathy of unknown significance) 1999   prevously under care of Dr. 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   Tobacco abuse    quit in 2019    SURGICAL HISTORY: Past Surgical History:  Procedure Laterality Date   BASAL CELL CARCINOMA EXCISION  11/26/2008   L face, R temple   BILATERAL SALPINGOOPHORECTOMY  11/26/2005   cancerous tumor R ovary   BIOPSY  10/07/2023   Procedure: BIOPSY;  Surgeon: Wilhelmenia Aloha Raddle., MD;  Location: [facility] ENDOSCOPY;  Service: Gastroenterology;;   BRONCHIAL BIOPSY  07/09/2023   Procedure: BRONCHIAL BIOPSIES;  Surgeon: Shelah Lamar RAMAN, MD;  Location: [facility] ENDOSCOPY;  Service: Pulmonary;;   BRONCHIAL BRUSHINGS  07/09/2023   Procedure: BRONCHIAL BRUSHINGS;  Surgeon: Shelah Lamar RAMAN, MD;  Location: [facility] ENDOSCOPY;  Service:  Pulmonary;;   BRONCHIAL NEEDLE ASPIRATION BIOPSY  07/09/2023   Procedure: BRONCHIAL NEEDLE ASPIRATION BIOPSIES;  Surgeon: Shelah Lamar RAMAN, MD;  Location: [facility] ENDOSCOPY;  Service: Pulmonary;;   CATARACT EXTRACTION  11/27/2007   bilateral   COLONOSCOPY  11/26/2010   Dr. Vicci. Diverticulosis. BE also done--normal   ESOPHAGOGASTRODUODENOSCOPY (EGD) WITH PROPOFOL  N/A 10/07/2023   Procedure: ESOPHAGOGASTRODUODENOSCOPY (EGD) WITH PROPOFOL ;  Surgeon: Wilhelmenia Aloha Raddle., MD;  Location: [facility]OS[facility];  Service: Gastroenterology;  Laterality: N/A;   EUS N/A 10/07/2023   Procedure: UPPER ENDOSCOPIC ULTRASOUND (EUS) RADIAL;  Surgeon: Wilhelmenia Aloha Raddle., MD;  Location: [facility];  Service: Gastroenterology;  Laterality: N/A;   FIDUCIAL MARKER PLACEMENT  07/09/2023   Procedure: FIDUCIAL MARKER PLACEMENT;  Surgeon: Shelah Lamar RAMAN, MD;  Location: [facility] ENDOSCOPY;  Service: Pulmonary;;   FINE NEEDLE ASPIRATION N/A 10/07/2023   Procedure: FINE NEEDLE ASPIRATION (FNA) LINEAR;  Surgeon: Wilhelmenia Aloha Raddle., MD;  Location: [facility];  Service: Gastroenterology;  Laterality: N/A;   OMENTECTOMY  11/26/2005   and pelvic lymphadenectomy (with BSO)   PORTACATH PLACEMENT N/A 11/22/2023   Procedure: INSERTION PORT-A-CATH;  Surgeon: Aron Shoulders, MD;  Location: [facility] OR;  Service: General;  Laterality: N/A;   ROBOTIC ASSISTED LAPAROSCOPIC LYSIS OF ADHESION N/A 07/18/2023   Procedure: ROBOTIC ASSISTED PELVIC MASS EXCISION WITH RADICAL DISSECTION, MINI LAPAROTOMY, CYSTOSCOPY;  Surgeon: Viktoria Comer SAUNDERS, MD;  Location: [facility]S;  Service: Gynecology;  Laterality: N/A;   SKIN CANCER EXCISION Left 07/2021   neck; pt can't recall type of cancer   TONSILLECTOMY  age 66   VAGINAL HYSTERECTOMY  late 20's   didn't want any more children    I have reviewed the social history and family history with the patient and they are unchanged from previous note.  ALLERGIES:  has no known allergies.  MEDICATIONS:   Current Outpatient Medications  Medication Sig Dispense Refill   acetaminophen  (TYLENOL ) 500 MG tablet Take 2 tablets (1,000 mg total) by mouth every 8 (eight) hours as needed for mild pain (pain score 1-3). May use Tylenol  for next 1-3 days if needed for abdominal pain, but otherwise call office if any issues arise     albuterol  (VENTOLIN  HFA) 108 (90 Base) MCG/ACT inhaler Inhale 2 puffs into the lungs every 4 (four) hours as needed for wheezing or shortness of breath. 6.7 g 0   cholecalciferol (VITAMIN D3) 25 MCG (1000 UNIT) tablet Take 1,000 Units by mouth daily.     Coenzyme Q10 (COQ10) 100 MG CAPS Take 100 mg by mouth daily.     lidocaine -prilocaine  (EMLA ) cream Apply to affected area once 30 g 3   losartan  (COZAAR ) 50 MG tablet Take 1 tablet (50 mg total) by mouth daily. 90 tablet 3   magic mouthwash (nystatin , lidocaine , diphenhydrAMINE , alum & mag hydroxide) suspension Take 5 mLs by mouth 4 (four) times daily as needed for mouth pain. 400 mL 0   ondansetron  (ZOFRAN ) 8 MG tablet Take 1 tablet (8 mg total)  by mouth every 8 (eight) hours as needed for nausea or vomiting. 30 tablet 1   prochlorperazine  (COMPAZINE ) 10 MG tablet Take 1 tablet (10 mg total) by mouth every 6 (six) hours as needed for nausea or vomiting. 30 tablet 1   rosuvastatin  (CRESTOR ) 10 MG tablet Take 1 tablet (10 mg total) by mouth daily. (Patient taking differently: Take 10 mg by mouth at bedtime.) 90 tablet 3   Tiotropium Bromide -Olodaterol (STIOLTO RESPIMAT ) 2.5-2.5 MCG/ACT AERS INHALE 2 PUFFS BY MOUTH ONCE DAILY 4 g 11   No current facility-administered medications for this visit.    PHYSICAL EXAMINATION: ECOG PERFORMANCE STATUS: 1 - Symptomatic but completely ambulatory  Vitals:   01/01/24 1149  BP: (!) 127/58  Pulse: 73  Resp: 18  Temp: (!) 97.5 F (36.4 C)  SpO2: 93%   Wt Readings from Last 3 Encounters:  01/01/24 137 lb 1.6 oz (62.2 kg)  12/18/23 137 lb 9 oz (62.4 kg)  12/04/23 139 lb 3.2 oz (63.1 kg)      GENERAL:alert, no distress and comfortable SKIN: skin color, texture, turgor are normal, no rashes or significant lesions EYES: normal, Conjunctiva are pink and non-injected, sclera clear NECK: supple, thyroid  normal size, non-tender, without nodularity LYMPH:  no palpable lymphadenopathy in the cervical, axillary  LUNGS: clear to auscultation and percussion with normal breathing effort HEART: regular rate & rhythm and no murmurs and no lower extremity edema ABDOMEN:abdomen soft, non-tender and normal bowel sounds Musculoskeletal:no cyanosis of digits and no clubbing  NEURO: alert & oriented x 3 with fluent speech, no focal motor/sensory deficits   LABORATORY DATA:  I have reviewed the data as listed    Latest Ref Rng & Units 01/01/2024   11:23 AM 12/18/2023   10:27 AM 12/04/2023    9:32 AM  CBC  WBC 4.0 - 10.5 K/uL 5.2  4.1  5.9   Hemoglobin 12.0 - 15.0 g/dL 88.7  88.4  87.4   Hematocrit 36.0 - 46.0 % 34.3  35.4  36.5   Platelets 150 - 400 K/uL 151  185  161         Latest Ref Rng & Units 12/18/2023   10:27 AM 12/04/2023    9:32 AM 11/26/2023    1:20 PM  CMP  Glucose 70 - 99 mg/dL 884  881  869   BUN 8 - 23 mg/dL 11  14  11    Creatinine 0.44 - 1.00 mg/dL 9.33  9.35  9.30   Sodium 135 - 145 mmol/L 137  139  141   Potassium 3.5 - 5.1 mmol/L 4.0  3.8  4.0   Chloride 98 - 111 mmol/L 103  102  103   CO2 22 - 32 mmol/L 30  30  32   Calcium  8.9 - 10.3 mg/dL 9.1  9.8  9.6   Total Protein 6.5 - 8.1 g/dL 6.9  7.2  7.3   Total Bilirubin 0.0 - 1.2 mg/dL 0.5  0.6  0.6   Alkaline Phos 38 - 126 U/L 87  97  93   AST 15 - 41 U/L 19  16  15    ALT 0 - 44 U/L 18  13  11        RADIOGRAPHIC STUDIES: I have personally reviewed the radiological images as listed and agreed with the findings in the report. No results found.    Orders Placed This Encounter  Procedures   CBC with Differential (Cancer Center Only)    Standing Status:   Future  Expected Date:   02/17/2024    Expiration  Date:   02/16/2025   CMP (Cancer Center only)    Standing Status:   Future    Expected Date:   02/17/2024    Expiration Date:   02/16/2025   CBC with Differential (Cancer Center Only)    Standing Status:   Future    Expected Date:   03/02/2024    Expiration Date:   03/02/2025   CMP (Cancer Center only)    Standing Status:   Future    Expected Date:   03/02/2024    Expiration Date:   03/02/2025   All questions were answered. The patient knows to call the clinic with any problems, questions or concerns. No barriers to learning was detected. The total time spent in the appointment was 25 minutes.     Tiffany Mattock, MD 01/01/2024

## 2024-01-02 ENCOUNTER — Telehealth: Payer: Self-pay | Admitting: *Deleted

## 2024-01-02 NOTE — Telephone Encounter (Signed)
 Called patient to inform of Ct for for 01-10-24- arrival time- 4:30 pm @ [facility], no  restrictions to scan, patient to receive results from Dr. Eloise Hake on 01-16-24 @ 9:30 am, spoke with patient and she is aware of these appts. and the instructions

## 2024-01-03 ENCOUNTER — Telehealth: Payer: Self-pay | Admitting: Radiation Oncology

## 2024-01-03 NOTE — Telephone Encounter (Signed)
 Pt called asking to r/s f/u15 2/20 due to having chemo the day before. Pt's appt moved to 2/24 per her request.

## 2024-01-10 ENCOUNTER — Ambulatory Visit (HOSPITAL_COMMUNITY)
Admission: RE | Admit: 2024-01-10 | Discharge: 2024-01-10 | Disposition: A | Discharge status: Home / Self Care | Payer: Medicare Other | Source: Ambulatory Visit | Attending: Radiology | Admitting: Radiology

## 2024-01-10 DIAGNOSIS — C3411 Malignant neoplasm of upper lobe, right bronchus or lung: Secondary | ICD-10-CM | POA: Diagnosis not present

## 2024-01-10 DIAGNOSIS — I700 Atherosclerosis of aorta: Secondary | ICD-10-CM | POA: Diagnosis not present

## 2024-01-10 DIAGNOSIS — K8689 Other specified diseases of pancreas: Secondary | ICD-10-CM | POA: Diagnosis not present

## 2024-01-10 DIAGNOSIS — J432 Centrilobular emphysema: Secondary | ICD-10-CM | POA: Diagnosis not present

## 2024-01-13 ENCOUNTER — Telehealth: Payer: Self-pay | Admitting: Hematology

## 2024-01-13 ENCOUNTER — Other Ambulatory Visit (HOSPITAL_COMMUNITY): Payer: Self-pay

## 2024-01-14 ENCOUNTER — Inpatient Hospital Stay: Payer: Medicare Other

## 2024-01-14 ENCOUNTER — Encounter: Payer: Self-pay | Admitting: Nurse Practitioner

## 2024-01-14 ENCOUNTER — Inpatient Hospital Stay: Payer: Medicare Other | Admitting: Nurse Practitioner

## 2024-01-14 VITALS — BP 128/70 | HR 100 | Temp 97.9°F | Resp 18 | Wt 133.8 lb

## 2024-01-14 DIAGNOSIS — Z85828 Personal history of other malignant neoplasm of skin: Secondary | ICD-10-CM | POA: Diagnosis not present

## 2024-01-14 DIAGNOSIS — Z95828 Presence of other vascular implants and grafts: Secondary | ICD-10-CM

## 2024-01-14 DIAGNOSIS — C250 Malignant neoplasm of head of pancreas: Secondary | ICD-10-CM

## 2024-01-14 DIAGNOSIS — Z79899 Other long term (current) drug therapy: Secondary | ICD-10-CM | POA: Diagnosis not present

## 2024-01-14 DIAGNOSIS — D649 Anemia, unspecified: Secondary | ICD-10-CM | POA: Diagnosis not present

## 2024-01-14 DIAGNOSIS — I10 Essential (primary) hypertension: Secondary | ICD-10-CM | POA: Diagnosis not present

## 2024-01-14 DIAGNOSIS — M8580 Other specified disorders of bone density and structure, unspecified site: Secondary | ICD-10-CM | POA: Diagnosis not present

## 2024-01-14 DIAGNOSIS — Z90710 Acquired absence of both cervix and uterus: Secondary | ICD-10-CM | POA: Diagnosis not present

## 2024-01-14 DIAGNOSIS — Z5111 Encounter for antineoplastic chemotherapy: Secondary | ICD-10-CM | POA: Diagnosis not present

## 2024-01-14 DIAGNOSIS — Z90722 Acquired absence of ovaries, bilateral: Secondary | ICD-10-CM | POA: Diagnosis not present

## 2024-01-14 DIAGNOSIS — Z923 Personal history of irradiation: Secondary | ICD-10-CM | POA: Diagnosis not present

## 2024-01-14 DIAGNOSIS — Z79631 Long term (current) use of antimetabolite agent: Secondary | ICD-10-CM | POA: Diagnosis not present

## 2024-01-14 DIAGNOSIS — Z79633 Long term (current) use of mitotic inhibitor: Secondary | ICD-10-CM | POA: Diagnosis not present

## 2024-01-14 DIAGNOSIS — Z860100 Personal history of colon polyps, unspecified: Secondary | ICD-10-CM | POA: Diagnosis not present

## 2024-01-14 DIAGNOSIS — C3411 Malignant neoplasm of upper lobe, right bronchus or lung: Secondary | ICD-10-CM | POA: Diagnosis not present

## 2024-01-14 LAB — CBC WITH DIFFERENTIAL (CANCER CENTER ONLY)
Abs Immature Granulocytes: 0.02 10*3/uL (ref 0.00–0.07)
Basophils Absolute: 0 10*3/uL (ref 0.0–0.1)
Basophils Relative: 0 %
Eosinophils Absolute: 0.1 10*3/uL (ref 0.0–0.5)
Eosinophils Relative: 1 %
HCT: 35.7 % — ABNORMAL LOW (ref 36.0–46.0)
Hemoglobin: 11.7 g/dL — ABNORMAL LOW (ref 12.0–15.0)
Immature Granulocytes: 0 %
Lymphocytes Relative: 14 %
Lymphs Abs: 0.8 10*3/uL (ref 0.7–4.0)
MCH: 31.5 pg (ref 26.0–34.0)
MCHC: 32.8 g/dL (ref 30.0–36.0)
MCV: 96 fL (ref 80.0–100.0)
Monocytes Absolute: 0.8 10*3/uL (ref 0.1–1.0)
Monocytes Relative: 14 %
Neutro Abs: 4.3 10*3/uL (ref 1.7–7.7)
Neutrophils Relative %: 71 %
Platelet Count: 162 10*3/uL (ref 150–400)
RBC: 3.72 MIL/uL — ABNORMAL LOW (ref 3.87–5.11)
RDW: 14.8 % (ref 11.5–15.5)
WBC Count: 6.1 10*3/uL (ref 4.0–10.5)
nRBC: 0 % (ref 0.0–0.2)

## 2024-01-14 LAB — CMP (CANCER CENTER ONLY)
ALT: 27 U/L (ref 0–44)
AST: 22 U/L (ref 15–41)
Albumin: 4.1 g/dL (ref 3.5–5.0)
Alkaline Phosphatase: 100 U/L (ref 38–126)
Anion gap: 7 (ref 5–15)
BUN: 15 mg/dL (ref 8–23)
CO2: 30 mmol/L (ref 22–32)
Calcium: 9.4 mg/dL (ref 8.9–10.3)
Chloride: 101 mmol/L (ref 98–111)
Creatinine: 0.64 mg/dL (ref 0.44–1.00)
GFR, Estimated: >60 mL/min (ref >60)
Glucose, Bld: 91 mg/dL (ref 70–99)
Potassium: 4 mmol/L (ref 3.5–5.1)
Sodium: 138 mmol/L (ref 135–145)
Total Bilirubin: 0.6 mg/dL (ref 0.0–1.2)
Total Protein: 7.1 g/dL (ref 6.5–8.1)

## 2024-01-14 MED ORDER — PACLITAXEL PROTEIN-BOUND CHEMO INJECTION 100 MG
125.0000 mg/m2 | Freq: Once | INTRAVENOUS | Status: AC
  Administered 2024-01-14: 200 mg via INTRAVENOUS
  Filled 2024-01-14: qty 40

## 2024-01-14 MED ORDER — HEPARIN SOD (PORK) LOCK FLUSH 100 UNIT/ML IV SOLN
500.0000 [IU] | Freq: Once | INTRAVENOUS | Status: AC | PRN
Start: 2024-01-14 — End: 2024-01-14
  Administered 2024-01-14: 500 [IU]

## 2024-01-14 MED ORDER — SODIUM CHLORIDE 0.9% FLUSH
10.0000 mL | Freq: Once | INTRAVENOUS | Status: AC
  Administered 2024-01-14: 10 mL

## 2024-01-14 MED ORDER — SODIUM CHLORIDE 0.9% FLUSH
10.0000 mL | INTRAVENOUS | Status: DC | PRN
  Administered 2024-01-14: 10 mL

## 2024-01-14 MED ORDER — PROCHLORPERAZINE MALEATE 10 MG PO TABS
10.0000 mg | ORAL_TABLET | Freq: Once | ORAL | Status: AC
  Administered 2024-01-14: 10 mg via ORAL
  Filled 2024-01-14: qty 1

## 2024-01-14 MED ORDER — SODIUM CHLORIDE 0.9 % IV SOLN
1000.0000 mg/m2 | Freq: Once | INTRAVENOUS | Status: AC
  Administered 2024-01-14: 1711 mg via INTRAVENOUS
  Filled 2024-01-14: qty 45

## 2024-01-14 MED ORDER — SODIUM CHLORIDE 0.9 % IV SOLN: INTRAVENOUS | Status: DC

## 2024-01-14 NOTE — Patient Instructions (Signed)
 [facility] - A DEPT OF [facility]  Discharge Instructions: Thank you for choosing [facility] to provide your oncology and hematology care.   If you have a lab appointment with the Cancer Center, please go directly to the Cancer Center and check in at the registration area.   Wear comfortable clothing and clothing appropriate for easy access to any Portacath or PICC line.   We strive to give you quality time with your provider. You may need to reschedule your appointment if you arrive late (15 or more minutes).  Arriving late affects you and other patients whose appointments are after yours.  Also, if you miss three or more appointments without notifying the office, you may be dismissed from the clinic at the provider's discretion.      For prescription refill requests, have your pharmacy contact our office and allow 72 hours for refills to be completed.    Today you received the following chemotherapy and/or immunotherapy agents abraxane, gemcitabine      To help prevent nausea and vomiting after your treatment, we encourage you to take your nausea medication as directed.  BELOW ARE SYMPTOMS THAT SHOULD BE REPORTED IMMEDIATELY: *FEVER GREATER THAN 100.4 F (38 C) OR HIGHER *CHILLS OR SWEATING *NAUSEA AND VOMITING THAT IS NOT CONTROLLED WITH YOUR NAUSEA MEDICATION *UNUSUAL SHORTNESS OF BREATH *UNUSUAL BRUISING OR BLEEDING *URINARY PROBLEMS (pain or burning when urinating, or frequent urination) *BOWEL PROBLEMS (unusual diarrhea, constipation, pain near the anus) TENDERNESS IN MOUTH AND THROAT WITH OR WITHOUT PRESENCE OF ULCERS (sore throat, sores in mouth, or a toothache) UNUSUAL RASH, SWELLING OR PAIN  UNUSUAL VAGINAL DISCHARGE OR ITCHING   Items with * indicate a potential emergency and should be followed up as soon as possible or go to the Emergency Department if any problems should occur.  Please show the CHEMOTHERAPY ALERT CARD or  IMMUNOTHERAPY ALERT CARD at check-in to the Emergency Department and triage nurse.  Should you have questions after your visit or need to cancel or reschedule your appointment, please contact [facility] - A DEPT OF [facility]  Dept: [phone]  and follow the prompts.  Office hours are 8:00 a.m. to 4:30 p.m. Monday - Friday. Please note that voicemails left after 4:00 p.m. may not be returned until the following business day.  We are closed weekends and major holidays. You have access to a nurse at all times for urgent questions. Please call the main number to the clinic Dept: [phone] and follow the prompts.   For any non-urgent questions, you may also contact your provider using MyChart. We now offer e-Visits for anyone 109 and older to request care online for non-urgent symptoms. For details visit [email or web address].   Also download the MyChart app! Go to the app store, search "MyChart", open the app, select , and log in with your MyChart username and password.

## 2024-01-14 NOTE — Progress Notes (Signed)
 Patient Care Team: Joselyn Arrow, MD as PCP - General (Family Medicine) Luciano Cutter, MD as Consulting Physician (Pulmonary Disease) Antonietta Barcelona, OD as Referring Physician (Optometry) Clyda Hurdle (Physician Assistant) Malachy Mood, MD as Consulting Physician (Hematology and Oncology) Carlean Jews, NP as Nurse Practitioner (Hematology and Oncology)   CHIEF COMPLAINT: Follow up pancreatic cancer   Oncology History  Adenocarcinoma of upper lobe of right lung (HCC)  08/06/2023 Initial Diagnosis   Adenocarcinoma of upper lobe of right lung (HCC)   08/06/2023 Cancer Staging   Staging form: Lung, AJCC 8th Edition - Clinical: Stage IB (cT2a, cN0, cM0) - Signed by Si Gaul, MD on 08/06/2023   10/14/2023 Genetic Testing   Negative genetic testing on the CancerNext-Expanded+RNAinsight.  The report date is 10/13/2023.  The CancerNext-Expanded gene panel offered by [employer] and includes sequencing and rearrangement analysis for the following 71 genes: AIP, ALK, APC, ATM, BAP1, BARD1, BMPR1A, BRCA1, BRCA2, BRIP1, CDC73, CDH1, CDK4, CDKN1B, CDKN2A, CHEK2, DICER1, FH, FLCN, KIF1B, LZTR1, MAX, MEN1, MET, MLH1, MSH2, MSH6, MUTYH, NF1, NF2, NTHL1, PALB2, PHOX2B, PMS2, POT1, PRKAR1A, PTCH1, PTEN, RAD51C, RAD51D, RB1, RET, SDHA, SDHAF2, SDHB, SDHC, SDHD, SMAD4, SMARCA4, SMARCB1, SMARCE1, STK11, SUFU, TMEM127, TP53, TSC1, TSC2 and VHL (sequencing and deletion/duplication); AXIN2, CTNNA1, EGFR, EGLN1, HOXB13, KIT, MITF, MSH3, PDGFRA, POLD1 and POLE (sequencing only); EPCAM and GREM1 (deletion/duplication only). RNA data is routinely analyzed for use in variant interpretation for all genes.    Pancreatic cancer (HCC)  10/07/2023 Cancer Staging   Staging form: Exocrine Pancreas, AJCC 8th Edition - Clinical stage from 10/07/2023: Stage IB (cT2, cN0, cM0) - Signed by Malachy Mood, MD on 10/25/2023 Total positive nodes: 0   10/25/2023 Initial Diagnosis   Pancreatic cancer  (HCC)   12/04/2023 -  Chemotherapy   [name] : PANCREATIC Abraxane D1,8,15 + Gemcitabine D1,8,15 q28d        CURRENT THERAPY: Gemcitabine and Abraxane D1, 15 q28 days, starting 12/04/2023   INTERVAL HISTORY Ms. Tiffany [name] returns for follow up as scheduled. Last seen by Dr. Mosetta Putt 01/01/24 with C2D1 gem/abraxane. Tolerates chemo well overall. She continues to do some light cooking/housework. She does get short of breath after chemo and after prolonged activity. Uses home O2 when needed. Cough is stable, denies chest pain. For the past month or so she has R shoulder blade pain with movement. Sometimes wakes her up from sleep when she turns over. Tylenol doesn't help much. Left toes feel like they're asleep at times, no other neuropathy.   ROS  All other systems reviewed and negative  Past Medical History:  Diagnosis Date   Abnormal PFT 09/02/2014   moderate restrictive ventilatory defect.   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 Terri Piedra   Colon polyp 12/2003   Diverticulosis 11/2010   seen on colonoscopy and air contrast BE   Emphysema of lung (HCC)    Family history of breast cancer    GERD (gastroesophageal reflux disease)    History of radiation therapy    Right Lung-09/04/23-09/09/23- Dr. Antony Blackbird   Hyperlipidemia    Hypertension    previously treated with meds, resolved   MGUS (monoclonal gammopathy of unknown significance) 1999   prevously under care of Dr. 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   Tobacco abuse    quit in 2019     Past  Surgical History:  Procedure Laterality Date   BASAL CELL CARCINOMA EXCISION  11/26/2008   L face, R temple   BILATERAL SALPINGOOPHORECTOMY  11/26/2005   cancerous tumor R ovary   BIOPSY  10/07/2023   Procedure: BIOPSY;  Surgeon: Lemar Lofty., MD;  Location: [facility] ENDOSCOPY;  Service: Gastroenterology;;   BRONCHIAL BIOPSY  07/09/2023   Procedure: BRONCHIAL BIOPSIES;  Surgeon: Leslye Peer, MD;  Location: [facility] ENDOSCOPY;  Service: Pulmonary;;   BRONCHIAL BRUSHINGS  07/09/2023   Procedure: BRONCHIAL BRUSHINGS;  Surgeon: Leslye Peer, MD;  Location: [facility] ENDOSCOPY;  Service: Pulmonary;;   BRONCHIAL NEEDLE ASPIRATION BIOPSY  07/09/2023   Procedure: BRONCHIAL NEEDLE ASPIRATION BIOPSIES;  Surgeon: Leslye Peer, MD;  Location: [facility] ENDOSCOPY;  Service: Pulmonary;;   CATARACT EXTRACTION  11/27/2007   bilateral   COLONOSCOPY  11/26/2010   Dr. Laural Benes. Diverticulosis. BE also done--normal   ESOPHAGOGASTRODUODENOSCOPY (EGD) WITH PROPOFOL N/A 10/07/2023   Procedure: ESOPHAGOGASTRODUODENOSCOPY (EGD) WITH PROPOFOL;  Surgeon: Meridee Score Netty Starring., MD;  Location: [facility] ENDOSCOPY;  Service: Gastroenterology;  Laterality: N/A;   EUS N/A 10/07/2023   Procedure: UPPER ENDOSCOPIC ULTRASOUND (EUS) RADIAL;  Surgeon: Lemar Lofty., MD;  Location: [facility] ENDOSCOPY;  Service: Gastroenterology;  Laterality: N/A;   FIDUCIAL MARKER PLACEMENT  07/09/2023   Procedure: FIDUCIAL MARKER PLACEMENT;  Surgeon: Leslye Peer, MD;  Location: [facility] ENDOSCOPY;  Service: Pulmonary;;   FINE NEEDLE ASPIRATION N/A 10/07/2023   Procedure: FINE NEEDLE ASPIRATION (FNA) LINEAR;  Surgeon: Lemar Lofty., MD;  Location: [facility] ENDOSCOPY;  Service: Gastroenterology;  Laterality: N/A;   OMENTECTOMY  11/26/2005   and pelvic lymphadenectomy (with BSO)   PORTACATH PLACEMENT N/A 11/22/2023   Procedure: INSERTION PORT-A-CATH;  Surgeon: [name], MD;  Location: [facility] OR;  Service: General;  Laterality: N/A;   ROBOTIC ASSISTED  LAPAROSCOPIC LYSIS OF ADHESION N/A 07/18/2023   Procedure: ROBOTIC ASSISTED PELVIC MASS EXCISION WITH RADICAL DISSECTION, MINI LAPAROTOMY, CYSTOSCOPY;  Surgeon: Carver Fila, MD;  Location: [facility] ORS;  Service: Gynecology;  Laterality: N/A;   SKIN CANCER EXCISION Left 07/2021   neck; pt can't recall type of cancer   TONSILLECTOMY  age 89   VAGINAL HYSTERECTOMY  late 20's   "didn't want any more children"     Outpatient Encounter Medications as of 01/14/2024  Medication Sig Note   acetaminophen (TYLENOL) 500 MG tablet Take 2 tablets (1,000 mg total) by mouth every 8 (eight) hours as needed for mild pain (pain score 1-3). May use Tylenol for next 1-3 days if needed for abdominal pain, but otherwise call office if any issues arise 11/21/2023: As needed   albuterol (VENTOLIN HFA) 108 (90 Base) MCG/ACT inhaler INHALE 2 PUFFS BY MOUTH EVERY 6 HOURS AS NEEDED FOR WHEEZING OR SHORTNESS OF BREATH    albuterol (VENTOLIN HFA) 108 (90 Base) MCG/ACT inhaler Inhale 2 puffs into the lungs every 4 (four) hours as needed for wheezing or shortness of breath.    cholecalciferol (VITAMIN D3) 25 MCG (1000 UNIT) tablet Take 1,000 Units by mouth daily. 11/21/2023: Last used Tuesday night   Coenzyme Q10 (COQ10) 100 MG CAPS Take  100 mg by mouth daily. 11/21/2023: Last used Tuesday night   lidocaine-prilocaine (EMLA) cream Apply to affected area once    losartan (COZAAR) 50 MG tablet Take 1 tablet (50 mg total) by mouth daily. 11/21/2023: Rochele Pages tuesday   magic mouthwash (nystatin, lidocaine, diphenhydrAMINE, alum & mag hydroxide) suspension Take 5 mLs by mouth 4 (four) times daily as needed for mouth pain.    ondansetron (ZOFRAN) 8 MG tablet Take 1 tablet (8 mg total) by mouth every 8 (eight) hours as needed for nausea or vomiting.    prochlorperazine (COMPAZINE) 10 MG tablet Take 1 tablet (10 mg total) by mouth every 6 (six) hours as needed for nausea or vomiting.    Tiotropium Bromide-Olodaterol (STIOLTO RESPIMAT)  2.5-2.5 MCG/ACT AERS INHALE 2 PUFFS BY MOUTH ONCE DAILY    rosuvastatin (CRESTOR) 10 MG tablet Take 1 tablet (10 mg total) by mouth daily. (Patient taking differently: Take 10 mg by mouth at bedtime.)    Facility-Administered Encounter Medications as of 01/14/2024  Medication   [COMPLETED] sodium chloride flush (NS) 0.9 % injection 10 mL     Today's Vitals   01/14/24 1209  BP: 128/70  Pulse: 100  Resp: 18  Temp: 97.9 F (36.6 C)  TempSrc: Temporal  SpO2: 92%  Weight: 133 lb 12.8 oz (60.7 kg)  PainSc: 8    Body mass index is 22.27 kg/m.   PHYSICAL EXAM GENERAL:alert, no distress and comfortable SKIN: no rash  EYES: sclera clear NECK: without mass LYMPH:  no palpable cervical or supraclavicular lymphadenopathy  LUNGS: clear with normal breathing effort MSK: no right scapular ttp HEART: regular rate & rhythm, no lower extremity edema ABDOMEN: abdomen soft, non-tender and normal bowel sounds NEURO: alert & oriented x 3 with fluent speech, no focal motor/sensory deficits PAC without erythema    CBC    Component Value Date/Time   WBC 6.1 01/14/2024 1146   WBC 8.0 07/19/2023 0415   RBC 3.72 (L) 01/14/2024 1146   HGB 11.7 (L) 01/14/2024 1146   HGB 12.4 05/08/2023 0953   HCT 35.7 (L) 01/14/2024 1146   HCT 38.0 05/08/2023 0953   PLT 162 01/14/2024 1146   PLT 207 05/08/2023 0953   MCV 96.0 01/14/2024 1146   MCV 93 05/08/2023 0953   MCH 31.5 01/14/2024 1146   MCHC 32.8 01/14/2024 1146   RDW 14.8 01/14/2024 1146   RDW 12.2 05/08/2023 0953   LYMPHSABS 0.8 01/14/2024 1146   LYMPHSABS 1.0 05/08/2023 0953   MONOABS 0.8 01/14/2024 1146   EOSABS 0.1 01/14/2024 1146   EOSABS 0.1 05/08/2023 0953   BASOSABS 0.0 01/14/2024 1146   BASOSABS 0.0 05/08/2023 0953     CMP     Component Value Date/Time   NA 138 01/14/2024 1146   NA 137 05/08/2023 0953   K 4.0 01/14/2024 1146   CL 101 01/14/2024 1146   CO2 30 01/14/2024 1146   GLUCOSE 91 01/14/2024 1146   BUN 15 01/14/2024  1146   BUN 17 05/08/2023 0953   CREATININE 0.64 01/14/2024 1146   CREATININE 0.72 12/02/2017 0945   CALCIUM 9.4 01/14/2024 1146   PROT 7.1 01/14/2024 1146   PROT 7.1 05/08/2023 0953   ALBUMIN 4.1 01/14/2024 1146   ALBUMIN 4.2 05/08/2023 0953   AST 22 01/14/2024 1146   ALT 27 01/14/2024 1146   ALKPHOS 100 01/14/2024 1146   BILITOT 0.6 01/14/2024 1146   GFRNONAA >60 01/14/2024 1146   GFRAA 60 09/26/2020 1245     ASSESSMENT & PLAN:  78 year old female    Pancreatic cancer -Stage IB vs IV (cT2N0Mx) with lung nodules -Lung cancer screening CT 04/29/23 showed suspicious lung nodules.  -PET scan from June 10, 2023 showed a hypermetabolic 3.1 cm mass in the right upper lobe, and hypermetabolic soft tissue fullness in the pancreatic head, and partially solid and cystic 12.4 cm pelvic mass. -Biopsy of the lung mass showed adenocarcinoma (T2aM0M0) stage Ib, s/p SBRT ([place]) and is followed by Dr. Arbutus Ped  -She underwent surgical resection of the pelvic mass by GYN Dr. Pricilla Holm 06/2023, which confirmed recurrent granulosa cell tumor -EUS 10/07/23 by Dr. Meridee Score showed mass in the pancreatic head, T2N0, biopsy showed adenocarcinoma  -Pt has seen by pancreatobiliary surgeon Dr. Donell Beers, who recommends neoadjuvant chemotherapy due to the uncertainty of lung metastasis.  Patient declined surgery. -Repeated CT on November 01, 2023 showed improved treated lung mass, but several new/enlarging lung nodules, concerning for metastatic disease.  -Pt consented to Gem/abraxane. Underwent port placement and chemo ed -She saw Dr. Arbutus Ped 12/03/23 who reviewed her recent imaging and agrees with gem/abraxane, which may help lungs as well if this is related to her primary lung cancer, and to monitor treatment response with CT CAP. He plans to see her back if needed -Began chemo with gem/abraxane on days 1, 15 q28 days starting 12/04/23 -Ms. Farler appears stable. S/p C2D1 gem/abraxane, tolerating very  well overall, managing  well at home with good PS. No clinical evidence of disease progression -Will follow up the pending CT chest from 2/14 to evaluate the R scapular pain, ?sequela from prior right lung RT. Somewhat atypical location but could be from pancreatic head mass. Continue symptom management for now -Labs reviewed, adequate to proceed with C2 D15 gem/abraxane today as scheduled.  -F/up and C3D1 in 2 weeks     PLAN: -Labs reviewed -Proceed with C2 D15 gem/abraxane today as scheduled, no dose adjustments  -F/up and C3D1 in 2 weeks -Symptom management for R scapular pain, will f/up CT chest from 2/14   All questions were answered. The patient knows to call the clinic with any problems, questions or concerns. No barriers to learning were detected. I spent 20 minutes counseling the patient face to face. The total time spent in the appointment was 30 minutes and more than 50% was on counseling, review of test results, and coordination of care.   Santiago Glad, NP-C 01/14/2024

## 2024-01-15 ENCOUNTER — Other Ambulatory Visit: Payer: Medicare Other

## 2024-01-15 ENCOUNTER — Ambulatory Visit: Payer: Medicare Other

## 2024-01-15 ENCOUNTER — Ambulatory Visit: Payer: Medicare Other | Admitting: Hematology

## 2024-01-16 ENCOUNTER — Ambulatory Visit: Payer: Self-pay | Admitting: Radiation Oncology

## 2024-01-19 NOTE — Progress Notes (Signed)
 Radiation Oncology         [phone] ________________________________  Name: Tiffany [name] MRN: [id number]  Date: 01/20/2024  DOB: 09-20-1946  Follow-Up Visit Note  CC: Joselyn Arrow, MD  Si Gaul, MD  No diagnosis found.  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 Presented with right upper lobe hypermetabolic lung mass as well as an additional suspicious right lower lobe lung nodule that was not hypermetabolic on PET imaging. 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, s/p resection    Diagnosed with pancreatic cancer - adenocarcinoma diagnosed in November 2024, currently on chemotherapy   Interval Since Last Radiation: 4 months and 10 days   First Treatment Date: 2023-09-04 - Last Treatment Date: 2023-09-09 Plan Name: Lung_R_SBRT Site: Lung, Right Technique: SBRT/SRT-IMRT Mode: Photon Dose Per Fraction: 18 Gy Prescribed Dose (Delivered / Prescribed): 54 Gy / 54 Gy Prescribed Fxs (Delivered / Prescribed): 3 / 3  Narrative:  The patient returns today for routine follow-up and to review recent imaging. She was last seen here for follow-up on 10/10/23.        Since her last visit, the patient presented for a CT AP with contrast on 11/01/23 which demonstrated: a slight decrease in size of the subsolid nodule of the peripheral right upper lobe, consistent with treatment response; and a significant interval increase in size and solidification of multiple additional bilateral nodular opacities, including several in the right lower lobe, as well as a subsolid ground-glass opacity in the dependent left lower lobe, consistent with any combination of metastatic and synchronous multifocal lung malignancy. There was no significant interval change of the hypodense, infiltrative appearing mass occupying the majority of the pancreatic head, measuring 3.4 x 2.7 cm, consistent with primary pancreatic adenocarcinoma.  Severe dilation of the pancreatic duct was also noted. The pancreatic mass did appear to closely contact the adjacent right aspect of the superior mesenteric vein (with less than 180 degrees contact). No contact to the portal or splenic vein, or the superior mesenteric artery and celiac axis were demonstrated otherwise. CT otherwise showed no evidence of lymphadenopathy or metastatic disease in the abdomen or pelvis.      Based on tumor board consensus and Dr. Latanya Maudlin recommendations, the patient opted to proceed with chemotherapy consisting of Gemcitabine and Abraxane on 12/04/23. She has tolerated chemotherapy well thus far other than some constipation, nausea, mucositis, and SOB which occurred s/p cycle 1. Her only persistent symptom at this point seems to be some SOB after her infusions and with prolonged activity She did endorse a 1 month history or so of right scapular pain with movement during her most recent visit with Dr. Latanya Maudlin office on 02/18.       Her most recent chest CT without contrast performed on 01/10/24 demonstrates a decrease in density and size of the treated RUL lesion, consistent with a positive response to therapy. The other part solid nodules in both lower lobes remain suspicious for adenocarcinoma and appear similar when compared to her prior CT performed this past December (though could be perceived as enlarged when compared to her imaging from 07/03/23). Her CT otherwise shows no evidence of metastatic disease in the chest.          ***        Allergies:  has no known allergies.  Meds: Current Outpatient Medications  Medication Sig Dispense Refill   acetaminophen (TYLENOL) 500 MG tablet Take 2 tablets (1,000 mg total)  by mouth every 8 (eight) hours as needed for mild pain (pain score 1-3). May use Tylenol for next 1-3 days if needed for abdominal pain, but otherwise call office if any issues arise     albuterol (VENTOLIN HFA) 108 (90 Base) MCG/ACT inhaler INHALE 2 PUFFS BY  MOUTH EVERY 6 HOURS AS NEEDED FOR WHEEZING OR SHORTNESS OF BREATH 9 g 0   albuterol (VENTOLIN HFA) 108 (90 Base) MCG/ACT inhaler Inhale 2 puffs into the lungs every 4 (four) hours as needed for wheezing or shortness of breath. 6.7 g 0   cholecalciferol (VITAMIN D3) 25 MCG (1000 UNIT) tablet Take 1,000 Units by mouth daily.     Coenzyme Q10 (COQ10) 100 MG CAPS Take 100 mg by mouth daily.     lidocaine-prilocaine (EMLA) cream Apply to affected area once 30 g 3   losartan (COZAAR) 50 MG tablet Take 1 tablet (50 mg total) by mouth daily. 90 tablet 3   magic mouthwash (nystatin, lidocaine, diphenhydrAMINE, alum & mag hydroxide) suspension Take 5 mLs by mouth 4 (four) times daily as needed for mouth pain. 400 mL 0   ondansetron (ZOFRAN) 8 MG tablet Take 1 tablet (8 mg total) by mouth every 8 (eight) hours as needed for nausea or vomiting. 30 tablet 1   prochlorperazine (COMPAZINE) 10 MG tablet Take 1 tablet (10 mg total) by mouth every 6 (six) hours as needed for nausea or vomiting. 30 tablet 1   rosuvastatin (CRESTOR) 10 MG tablet Take 1 tablet (10 mg total) by mouth daily. (Patient taking differently: Take 10 mg by mouth at bedtime.) 90 tablet 3   Tiotropium Bromide-Olodaterol (STIOLTO RESPIMAT) 2.5-2.5 MCG/ACT AERS INHALE 2 PUFFS BY MOUTH ONCE DAILY 4 g 11   No current facility-administered medications for this encounter.    Physical Findings: The patient is in no acute distress. Patient is alert and oriented.  vitals were not taken for this visit. .  No significant changes. Lungs are clear to auscultation bilaterally. Heart has regular rate and rhythm. No palpable cervical, supraclavicular, or axillary adenopathy. Abdomen soft, non-tender, normal bowel sounds.   Lab Findings: Lab Results  Component Value Date   WBC 6.1 01/14/2024   HGB 11.7 (L) 01/14/2024   HCT 35.7 (L) 01/14/2024   MCV 96.0 01/14/2024   PLT 162 01/14/2024    Radiographic Findings: CT Chest Wo Contrast Result Date:  01/17/2024 CLINICAL DATA:  Non-small cell lung cancer, monitor. History of right upper lobe adenocarcinoma. * Tracking Code: BO * EXAM: CT CHEST WITHOUT CONTRAST TECHNIQUE: Multidetector CT imaging of the chest was performed following the standard protocol without IV contrast. RADIATION DOSE REDUCTION: This exam was performed according to the departmental dose-optimization program which includes automated exposure control, adjustment of the mA and/or kV according to patient size and/or use of iterative reconstruction technique. COMPARISON:  CT 11/01/2023 and 07/03/2023.  PET-CT 06/10/2023. FINDINGS: Cardiovascular: Again demonstrated is at least moderate atherosclerosis of the aorta, great vessels and coronary arteries. Left subclavian Port-A-Cath extends to the lower SVC level. The heart size is normal. There is no pericardial effusion. Mediastinum/Nodes: There are no enlarged mediastinal, hilar or axillary lymph nodes.Hilar assessment is limited by the lack of intravenous contrast, although the hilar contours appear unchanged. The thyroid gland, trachea and esophagus demonstrate no significant findings. Lungs/Pleura: No pleural effusion or pneumothorax. Moderate to severe centrilobular emphysema with stable biapical scarring. The treated part solid lesion peripheral to the fiducial marker in the right upper lobe shows decreased density and size,  measuring approximately 2.1 x 1.8 cm on image 79/6. Adjacent small solid nodule measuring 5 mm on image 1/6 is unchanged from multiple prior studies. Part solid lesion medially in the right lower lobe measures approximately 1.1 x 1.0 cm on image 112/6, similar to most recent examination, although enlarged from 07/03/2023. Part solid nodule posteriorly in the right lower lobe measures 1.1 x 0.9 cm on image 110/6, also similar to the most recent study and significantly enlarged from 07/03/2023. Part solid left lower lobe nodule measuring 1.5 x 1.4 cm on image 93/6 appears  stable, also enlarged from 07/03/2023. Compared with the most recent prior study of 10 weeks ago, and no definite new or enlarging nodules. Upper abdomen: No acute findings are seen in the visualized upper abdomen. Persistent diffuse pancreatic ductal dilatation due to known mass in the pancreatic head, incompletely visualized. Musculoskeletal/Chest wall: There is no chest wall mass or suspicious osseous finding. Stable mild spondylosis. IMPRESSION: 1. The treated part solid lesion peripheral to the fiducial marker in the right upper lobe shows decreased density and size, consistent with positive response to therapy. 2. The other part solid nodules in both lower lobes are similar to the most recent examination, although enlarged from 07/03/2023. These remain suspicious for multifocal adenocarcinoma. Attention on continued follow-up recommended. 3. No evidence of metastatic disease in the chest. 4. Persistent diffuse pancreatic ductal dilatation due to known mass in the pancreatic head, incompletely visualized on this chest CT. 5. Aortic Atherosclerosis (ICD10-I70.0) and Emphysema (ICD10-J43.9). Electronically Signed   By: Carey Bullocks M.D.   On: 01/17/2024 11:44    Impression: Stage IB (cT2a, cN0, cM0) adenocarcinoma of the right upper lobe. Presented with right upper lobe hypermetabolic lung mass as well as an additional suspicious right lower lobe lung nodule that was not hypermetabolic on PET imaging. 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, s/p resection    Diagnosed with pancreatic cancer - adenocarcinoma diagnosed in November 2024, currently on chemotherapy   The patient is recovering from the effects of radiation.  ***  Plan:  ***   *** minutes of total time was spent for this patient encounter, including preparation, face-to-face counseling with the patient and coordination of care, physical exam, and documentation of the  encounter. ____________________________________  Billie Lade, PhD, MD  This document serves as a record of services personally performed by Antony Blackbird, MD. It was created on his behalf by Neena Rhymes, a trained medical scribe. The creation of this record is based on the scribe's personal observations and the provider's statements to them. This document has been checked and approved by the attending provider.

## 2024-01-20 ENCOUNTER — Encounter: Payer: Self-pay | Admitting: Radiation Oncology

## 2024-01-20 ENCOUNTER — Ambulatory Visit
Admission: RE | Admit: 2024-01-20 | Discharge: 2024-01-20 | Disposition: A | Discharge status: Home / Self Care | Payer: Medicare Other | Source: Ambulatory Visit | Attending: Radiation Oncology | Admitting: Radiation Oncology

## 2024-01-20 VITALS — BP 139/65 | HR 88 | Temp 97.5°F | Resp 18 | Ht 65.0 in | Wt 131.1 lb

## 2024-01-20 DIAGNOSIS — C3411 Malignant neoplasm of upper lobe, right bronchus or lung: Secondary | ICD-10-CM | POA: Diagnosis not present

## 2024-01-20 DIAGNOSIS — J432 Centrilobular emphysema: Secondary | ICD-10-CM | POA: Diagnosis not present

## 2024-01-20 DIAGNOSIS — Z79899 Other long term (current) drug therapy: Secondary | ICD-10-CM | POA: Insufficient documentation

## 2024-01-20 DIAGNOSIS — I700 Atherosclerosis of aorta: Secondary | ICD-10-CM | POA: Diagnosis not present

## 2024-01-20 DIAGNOSIS — M25511 Pain in right shoulder: Secondary | ICD-10-CM | POA: Diagnosis not present

## 2024-01-20 DIAGNOSIS — Z923 Personal history of irradiation: Secondary | ICD-10-CM | POA: Diagnosis not present

## 2024-01-20 DIAGNOSIS — Z9221 Personal history of antineoplastic chemotherapy: Secondary | ICD-10-CM | POA: Diagnosis not present

## 2024-01-20 DIAGNOSIS — C259 Malignant neoplasm of pancreas, unspecified: Secondary | ICD-10-CM | POA: Diagnosis not present

## 2024-01-20 DIAGNOSIS — Z87891 Personal history of nicotine dependence: Secondary | ICD-10-CM | POA: Diagnosis not present

## 2024-01-20 NOTE — Progress Notes (Signed)
 Tiffany [name] is here today for follow up post radiation to the lung.  Lung Side: Right  Does the patient complain of any of the following: Pain:Denies Shortness of breath w/wo exertion: Denies Cough: Yes Hemoptysis: Denies Pain with swallowing: Denies Swallowing/choking concerns: Denies Appetite: Fair Energy Level: Fair Post radiation skin Changes: Denies  BP 139/65 (BP Location: Left Arm, Patient Position: Sitting)   Pulse 88   Temp (!) 97.5 F (36.4 C) (Temporal)   Resp 18   Ht 5\' 5"  (1.651 m)   Wt 131 lb 2 oz (59.5 kg)   SpO2 97%   BMI 21.82 kg/m      Additional comments if applicable:

## 2024-01-22 ENCOUNTER — Other Ambulatory Visit: Payer: Medicare Other

## 2024-01-22 ENCOUNTER — Ambulatory Visit: Payer: Medicare Other | Admitting: Nurse Practitioner

## 2024-01-22 ENCOUNTER — Ambulatory Visit: Payer: Medicare Other

## 2024-01-26 ENCOUNTER — Other Ambulatory Visit: Payer: Self-pay | Admitting: Nurse Practitioner

## 2024-01-26 DIAGNOSIS — C250 Malignant neoplasm of head of pancreas: Secondary | ICD-10-CM

## 2024-01-26 NOTE — Progress Notes (Deleted)
 Patient Care Team: Joselyn Arrow, MD as PCP - General (Family Medicine) Luciano Cutter, MD as Consulting Physician (Pulmonary Disease) Antonietta Barcelona, OD as Referring Physician (Optometry) Clyda Hurdle (Physician Assistant) Malachy Mood, MD as Consulting Physician (Hematology and Oncology) Carlean Jews, NP as Nurse Practitioner (Hematology and Oncology)  Clinic Day:  01/26/2024  Referring physician: Malachy Mood, MD  ASSESSMENT & PLAN:   Assessment & Plan: Pancreatic cancer (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  -PET scan from June 10, 2023 showed a hypermetabolic 3.1 cm mass in the right upper lobe, and hypermetabolic soft tissue fullness in the pancreatic head, and partially solid and cystic 12.4 cm pelvic mass. 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 Pricilla Holm  -baseline CA19.9 33 -Pt has seen by pancreatobiliary surgeon Dr. Donell Beers, who recommends neoadjuvant chemotherapy due to the uncertainty of lung metastasis.  Patient declined surgery. -Repeated CT on November 01, 2023 showed improved treated lung mass, but several new or enlarging lung nodules, concerning for metastatic disease.  -Patient is agreeable with chemo, she started gemcitabine and Abraxane on 12/04/2023    The patient understands the plans discussed today and is in agreement with them.  She knows to contact our office if she develops concerns prior to her next appointment.  I provided *** minutes of face-to-face time during this encounter and > 50% was spent counseling as documented under my assessment and plan.    Carlean Jews, NP  [facility] - A DE[facility] [address] [place]  [address] Dept: [phone] Dept Fax: [phone]   No orders of the defined types were placed in this encounter.     CHIEF COMPLAINT:  CC: Follow-up pancreatic cancer  Current Treatment: Gemcitabine and Abraxane D1 and D15 of 28-day cycle.  Started 12/04/2023  INTERVAL HISTORY:  Tiffany [name] is here today for repeat clinical assessment.  She was last seen by Clayborn Heron, NP on 01/14/2024.  Overall, has been tolerating chemotherapy well.  Does get short of breath after chemo and after prolonged activity.  Uses home oxygen when needed.  Today, she presents for cycle 3 day 1 of chemotherapy with gemcitabine and Abraxane.  She denies fevers or chills. She denies pain. Her appetite is good. Her weight {Weight change:10426}.  I have reviewed the past medical history, past surgical history, social history and family history with the patient and they are unchanged from previous note.  ALLERGIES:  has no known allergies.  MEDICATIONS:  Current Outpatient Medications  Medication Sig Dispense Refill   acetaminophen (TYLENOL) 500 MG tablet Take 2 tablets (1,000 mg total) by mouth every 8 (eight) hours as needed for mild pain (pain score 1-3). May use Tylenol for next 1-3 days if needed for abdominal pain, but otherwise call office if any issues arise     albuterol (VENTOLIN HFA) 108 (90 Base) MCG/ACT inhaler INHALE 2 PUFFS BY MOUTH EVERY 6 HOURS AS NEEDED FOR WHEEZING OR SHORTNESS OF BREATH 9 g 0   albuterol (VENTOLIN HFA) 108 (90 Base) MCG/ACT inhaler Inhale 2 puffs into the lungs every 4 (four) hours as needed for wheezing or shortness of breath. 6.7 g 0   cholecalciferol (VITAMIN D3) 25 MCG (1000  UNIT) tablet Take 1,000 Units by mouth daily.     Coenzyme Q10 (COQ10) 100 MG CAPS Take 100 mg by mouth daily.     lidocaine-prilocaine (EMLA) cream Apply to affected area once 30 g 3   losartan (COZAAR) 50 MG tablet Take 1 tablet (50 mg total) by mouth daily. 90 tablet 3   magic mouthwash (nystatin, lidocaine,  diphenhydrAMINE, alum & mag hydroxide) suspension Take 5 mLs by mouth 4 (four) times daily as needed for mouth pain. 400 mL 0   ondansetron (ZOFRAN) 8 MG tablet Take 1 tablet (8 mg total) by mouth every 8 (eight) hours as needed for nausea or vomiting. 30 tablet 1   prochlorperazine (COMPAZINE) 10 MG tablet Take 1 tablet (10 mg total) by mouth every 6 (six) hours as needed for nausea or vomiting. 30 tablet 1   rosuvastatin (CRESTOR) 10 MG tablet Take 1 tablet (10 mg total) by mouth daily. (Patient taking differently: Take 10 mg by mouth at bedtime.) 90 tablet 3   Tiotropium Bromide-Olodaterol (STIOLTO RESPIMAT) 2.5-2.5 MCG/ACT AERS INHALE 2 PUFFS BY MOUTH ONCE DAILY 4 g 11   No current facility-administered medications for this visit.    HISTORY OF PRESENT ILLNESS:   Oncology History  Adenocarcinoma of upper lobe of right lung (HCC)  08/06/2023 Initial Diagnosis   Adenocarcinoma of upper lobe of right lung (HCC)   08/06/2023 Cancer Staging   Staging form: Lung, AJCC 8th Edition - Clinical: Stage IB (cT2a, cN0, cM0) - Signed by Si Gaul, MD on 08/06/2023   10/14/2023 Genetic Testing   Negative genetic testing on the CancerNext-Expanded+RNAinsight.  The report date is 10/13/2023.  The CancerNext-Expanded gene panel offered by [employer] and includes sequencing and rearrangement analysis for the following 71 genes: AIP, ALK, APC, ATM, BAP1, BARD1, BMPR1A, BRCA1, BRCA2, BRIP1, CDC73, CDH1, CDK4, CDKN1B, CDKN2A, CHEK2, DICER1, FH, FLCN, KIF1B, LZTR1, MAX, MEN1, MET, MLH1, MSH2, MSH6, MUTYH, NF1, NF2, NTHL1, PALB2, PHOX2B, PMS2, POT1, PRKAR1A, PTCH1, PTEN, RAD51C, RAD51D, RB1, RET, SDHA, SDHAF2, SDHB, SDHC, SDHD, SMAD4, SMARCA4, SMARCB1, SMARCE1, STK11, SUFU, TMEM127, TP53, TSC1, TSC2 and VHL (sequencing and deletion/duplication); AXIN2, CTNNA1, EGFR, EGLN1, HOXB13, KIT, MITF, MSH3, PDGFRA, POLD1 and POLE (sequencing only); EPCAM and GREM1 (deletion/duplication only). RNA data is routinely  analyzed for use in variant interpretation for all genes.    Pancreatic cancer (HCC)  10/07/2023 Cancer Staging   Staging form: Exocrine Pancreas, AJCC 8th Edition - Clinical stage from 10/07/2023: Stage IB (cT2, cN0, cM0) - Signed by Malachy Mood, MD on 10/25/2023 Total positive nodes: 0   10/25/2023 Initial Diagnosis   Pancreatic cancer (HCC)   12/04/2023 -  Chemotherapy   [name] : PANCREATIC Abraxane D1,8,15 + Gemcitabine D1,8,15 q28d         REVIEW OF SYSTEMS:   Constitutional: Denies fevers, chills or abnormal weight loss Eyes: Denies blurriness of vision Ears, nose, mouth, throat, and face: Denies mucositis or sore throat Respiratory: Denies cough, dyspnea or wheezes Cardiovascular: Denies palpitation, chest discomfort or lower extremity swelling Gastrointestinal:  Denies nausea, heartburn or change in bowel habits Skin: Denies abnormal skin rashes Lymphatics: Denies new lymphadenopathy or easy bruising Neurological:Denies numbness, tingling or new weaknesses Behavioral/Psych: Mood is stable, no new changes  All other systems were reviewed with the patient and are negative.   VITALS:  There were no vitals taken for this visit.  Wt Readings from Last 3 Encounters:  01/20/24 131 lb 2 oz (59.5 kg)  01/14/24 133 lb 12.8  oz (60.7 kg)  01/01/24 137 lb 1.6 oz (62.2 kg)    There is no height or weight on file to calculate BMI.  Performance status (ECOG): {CHL ONC [id number]  PHYSICAL EXAM:   GENERAL:alert, no distress and comfortable SKIN: skin color, texture, turgor are normal, no rashes or significant lesions EYES: normal, Conjunctiva are pink and non-injected, sclera clear OROPHARYNX:no exudate, no erythema and lips, buccal mucosa, and tongue normal  NECK: supple, thyroid normal size, non-tender, without nodularity LYMPH:  no palpable lymphadenopathy in the cervical, axillary or inguinal LUNGS: clear to auscultation and percussion with normal  breathing effort HEART: regular rate & rhythm and no murmurs and no lower extremity edema ABDOMEN:abdomen soft, non-tender and normal bowel sounds Musculoskeletal:no cyanosis of digits and no clubbing  NEURO: alert & oriented x 3 with fluent speech, no focal motor/sensory deficits  LABORATORY DATA:  I have reviewed the data as listed    Component Value Date/Time   NA 138 01/14/2024 1146   NA 137 05/08/2023 0953   K 4.0 01/14/2024 1146   CL 101 01/14/2024 1146   CO2 30 01/14/2024 1146   GLUCOSE 91 01/14/2024 1146   BUN 15 01/14/2024 1146   BUN 17 05/08/2023 0953   CREATININE 0.64 01/14/2024 1146   CREATININE 0.72 12/02/2017 0945   CALCIUM 9.4 01/14/2024 1146   PROT 7.1 01/14/2024 1146   PROT 7.1 05/08/2023 0953   ALBUMIN 4.1 01/14/2024 1146   ALBUMIN 4.2 05/08/2023 0953   AST 22 01/14/2024 1146   ALT 27 01/14/2024 1146   ALKPHOS 100 01/14/2024 1146   BILITOT 0.6 01/14/2024 1146   GFRNONAA >60 01/14/2024 1146   GFRAA 60 09/26/2020 1245    No results found for: "SPEP", "UPEP"  Lab Results  Component Value Date   WBC 6.1 01/14/2024   NEUTROABS 4.3 01/14/2024   HGB 11.7 (L) 01/14/2024   HCT 35.7 (L) 01/14/2024   MCV 96.0 01/14/2024   PLT 162 01/14/2024      Chemistry      Component Value Date/Time   NA 138 01/14/2024 1146   NA 137 05/08/2023 0953   K 4.0 01/14/2024 1146   CL 101 01/14/2024 1146   CO2 30 01/14/2024 1146   BUN 15 01/14/2024 1146   BUN 17 05/08/2023 0953   CREATININE 0.64 01/14/2024 1146   CREATININE 0.72 12/02/2017 0945      Component Value Date/Time   CALCIUM 9.4 01/14/2024 1146   ALKPHOS 100 01/14/2024 1146   AST 22 01/14/2024 1146   ALT 27 01/14/2024 1146   BILITOT 0.6 01/14/2024 1146       RADIOGRAPHIC STUDIES: I have personally reviewed the radiological images as listed and agreed with the findings in the report. CT Chest Wo Contrast Result Date: 01/17/2024 CLINICAL DATA:  Non-small cell lung cancer, monitor. History of right upper  lobe adenocarcinoma. * Tracking Code: BO * EXAM: CT CHEST WITHOUT CONTRAST TECHNIQUE: Multidetector CT imaging of the chest was performed following the standard protocol without IV contrast. RADIATION DOSE REDUCTION: This exam was performed according to the departmental dose-optimization program which includes automated exposure control, adjustment of the mA and/or kV according to patient size and/or use of iterative reconstruction technique. COMPARISON:  CT 11/01/2023 and 07/03/2023.  PET-CT 06/10/2023. FINDINGS: Cardiovascular: Again demonstrated is at least moderate atherosclerosis of the aorta, great vessels and coronary arteries. Left subclavian Port-A-Cath extends to the lower SVC level. The heart size is normal. There is no pericardial effusion. Mediastinum/Nodes: There are no enlarged  mediastinal, hilar or axillary lymph nodes.Hilar assessment is limited by the lack of intravenous contrast, although the hilar contours appear unchanged. The thyroid gland, trachea and esophagus demonstrate no significant findings. Lungs/Pleura: No pleural effusion or pneumothorax. Moderate to severe centrilobular emphysema with stable biapical scarring. The treated part solid lesion peripheral to the fiducial marker in the right upper lobe shows decreased density and size, measuring approximately 2.1 x 1.8 cm on image 79/6. Adjacent small solid nodule measuring 5 mm on image 1/6 is unchanged from multiple prior studies. Part solid lesion medially in the right lower lobe measures approximately 1.1 x 1.0 cm on image 112/6, similar to most recent examination, although enlarged from 07/03/2023. Part solid nodule posteriorly in the right lower lobe measures 1.1 x 0.9 cm on image 110/6, also similar to the most recent study and significantly enlarged from 07/03/2023. Part solid left lower lobe nodule measuring 1.5 x 1.4 cm on image 93/6 appears stable, also enlarged from 07/03/2023. Compared with the most recent prior study of 10  weeks ago, and no definite new or enlarging nodules. Upper abdomen: No acute findings are seen in the visualized upper abdomen. Persistent diffuse pancreatic ductal dilatation due to known mass in the pancreatic head, incompletely visualized. Musculoskeletal/Chest wall: There is no chest wall mass or suspicious osseous finding. Stable mild spondylosis. IMPRESSION: 1. The treated part solid lesion peripheral to the fiducial marker in the right upper lobe shows decreased density and size, consistent with positive response to therapy. 2. The other part solid nodules in both lower lobes are similar to the most recent examination, although enlarged from 07/03/2023. These remain suspicious for multifocal adenocarcinoma. Attention on continued follow-up recommended. 3. No evidence of metastatic disease in the chest. 4. Persistent diffuse pancreatic ductal dilatation due to known mass in the pancreatic head, incompletely visualized on this chest CT. 5. Aortic Atherosclerosis (ICD10-I70.0) and Emphysema (ICD10-J43.9). Electronically Signed   By: Carey Bullocks M.D.   On: 01/17/2024 11:44

## 2024-01-26 NOTE — Assessment & Plan Note (Deleted)
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  -PET scan from June 10, 2023 showed a hypermetabolic 3.1 cm mass in the right upper lobe, and hypermetabolic soft tissue fullness in the pancreatic head, and partially solid and cystic 12.4 cm pelvic mass. 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 Pricilla Holm  -baseline CA19.9 33 -Pt has seen by pancreatobiliary surgeon Dr. Donell Beers, who recommends neoadjuvant chemotherapy due to the uncertainty of lung metastasis.  Patient declined surgery. -Repeated CT on November 01, 2023 showed improved treated lung mass, but several new or enlarging lung nodules, concerning for metastatic disease.  -Patient is agreeable with chemo, she started gemcitabine and Abraxane on 12/04/2023

## 2024-01-29 ENCOUNTER — Ambulatory Visit: Payer: Medicare Other | Admitting: Nurse Practitioner

## 2024-01-29 ENCOUNTER — Ambulatory Visit: Payer: Medicare Other

## 2024-01-29 ENCOUNTER — Other Ambulatory Visit: Payer: Medicare Other

## 2024-01-29 DIAGNOSIS — C250 Malignant neoplasm of head of pancreas: Secondary | ICD-10-CM

## 2024-01-29 NOTE — Progress Notes (Signed)
 Patient Care Team: Joselyn Arrow, MD as PCP - General (Family Medicine) Luciano Cutter, MD as Consulting Physician (Pulmonary Disease) Antonietta Barcelona, OD as Referring Physician (Optometry) Clyda Hurdle (Physician Assistant) Malachy Mood, MD as Consulting Physician (Hematology and Oncology) Carlean Jews, NP as Nurse Practitioner (Hematology and Oncology)  Clinic Day: 01/30/2024  Referring physician: Malachy Mood, MD  ASSESSMENT & PLAN:   Assessment & Plan: Pancreatic cancer (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  -PET scan from June 10, 2023 showed a hypermetabolic 3.1 cm mass in the right upper lobe, and hypermetabolic soft tissue fullness in the pancreatic head, and partially solid and cystic 12.4 cm pelvic mass. 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 Pricilla Holm  -baseline CA19.9 33 -Pt has seen by pancreatobiliary surgeon Dr. Donell Beers, who recommends neoadjuvant chemotherapy due to the uncertainty of lung metastasis.  Patient declined surgery. -Repeated CT on November 01, 2023 showed improved treated lung mass, but several new or enlarging lung nodules, concerning for metastatic disease.  -Patient is agreeable with chemo, she started gemcitabine and Abraxane on 12/04/2023 -01/31/2024 is cycle 3 day 1  -NEW CT CAP has been ordered during today's visit and should be scheduled in the first 2 weeks of April.   Back pain  Patient does have chronic low back pain.  Does have prescription for tramadol to take as needed.  She has taken this once, because severe nausea.  Unsure if she took with food.  Does have nausea medication she can take to combat this symptom.  Advised to try again with tramadol.  Encouraged her to take 1/2 tablet.  Advised to try taking with  food.  Also recommended she take nausea medication prior to taking tramadol to prevent nausea.  Caregiver stress Patient reports increased stress and decreased appetite.  Has had 2 pound weight loss since her last visit.  She states she is taking care of her sick husband.  He did have aortic valve replacement and recently acquired infection of the valve.  He is taking antibiotics through PICC line.  She is having to administer antibiotics herself.  This has been going on for the past week.  Encouraged her to rest, take "time out" for herself to prevent burnout.  Will consider contacting social work for counseling the patient develops increased stress and possible burnout.  Plan:  Reviewed labs. -Mild, chronic anemia.  She is taking prenatal vitamin with extra iron.  Tolerating well. -labs and patient status are adequate for treatment tomorrow.  -Ca 19.9 significantly decreased at 9, indicating good response to treatment.  -CT CAP will be scheduled for 02/2024.  -labs with follow up and treatment as scheduled with Dr. Mosetta Putt.     The patient understands the plans discussed today and is in agreement with them.  She knows to contact our office if she develops concerns prior to her next appointment.  I provided 25 minutes of face-to-face time during this encounter and > 50% was spent counseling as documented under my assessment and plan.    Carlean Jews, NP  [facility] - A DEPT OF [facility] [address] [place] [address] Dept: [phone] Dept Fax: [phone]   Orders Placed This  Encounter  Procedures   CT CHEST ABDOMEN PELVIS W CONTRAST    Standing Status:   Future    Expected Date:   03/05/2024    Expiration Date:   01/29/2025    If indicated for the ordered procedure, I authorize the administration of contrast media per Radiology protocol:   Yes    Does the patient have a contrast media/X-ray dye allergy?:    No    Preferred imaging location?:   [facility]    If indicated for the ordered procedure, I authorize the administration of oral contrast media per Radiology protocol:   Yes      CHIEF COMPLAINT:  CC: Follow-up pancreatic cancer  Current Treatment: Gemcitabine and Abraxane D1 and D15 of 28-day cycle.  Started 12/04/2023  INTERVAL HISTORY:  Tiffany [name] is here today for repeat clinical assessment.  She was last seen by Clayborn Heron, NP on 01/14/2024.  Overall, has been tolerating chemotherapy well.  Does get short of breath after chemo and after prolonged activity.  Uses home oxygen when needed.  Today, she presents for cycle 3 day 1 of chemotherapy with gemcitabine and Abraxane.  She reports increased back pain.  Did try taking tramadol once but got very nauseated.  She also has increased stress.  Taking care of her sick husband, administering IV antibiotics through PICC line.  Feels fatigued.  She denies chest pain or chest pressure. She denies headaches or visual disturbances. She denies abdominal pain, nausea, vomiting, or changes in bowel or bladder habits.  She denies fevers or chills. Her appetite is fair. Her weight has decreased 3 pounds over last week .  I have reviewed the past medical history, past surgical history, social history and family history with the patient and they are unchanged from previous note.  ALLERGIES:  has no known allergies.  MEDICATIONS:  Current Outpatient Medications  Medication Sig Dispense Refill   acetaminophen (TYLENOL) 500 MG tablet Take 2 tablets (1,000 mg total) by mouth every 8 (eight) hours as needed for mild pain (pain score 1-3). May use Tylenol for next 1-3 days if needed for abdominal pain, but otherwise call office if any issues arise     albuterol (VENTOLIN HFA) 108 (90 Base) MCG/ACT inhaler INHALE 2 PUFFS BY MOUTH EVERY 6 HOURS AS NEEDED FOR WHEEZING OR SHORTNESS OF BREATH 9 g 0   albuterol (VENTOLIN HFA) 108 (90 Base) MCG/ACT inhaler Inhale 2  puffs into the lungs every 4 (four) hours as needed for wheezing or shortness of breath. 6.7 g 0   cholecalciferol (VITAMIN D3) 25 MCG (1000 UNIT) tablet Take 1,000 Units by mouth daily.     Coenzyme Q10 (COQ10) 100 MG CAPS Take 100 mg by mouth daily.     lidocaine-prilocaine (EMLA) cream Apply to affected area once 30 g 3   losartan (COZAAR) 50 MG tablet Take 1 tablet (50 mg total) by mouth daily. 90 tablet 3   magic mouthwash (nystatin, lidocaine, diphenhydrAMINE, alum & mag hydroxide) suspension Take 5 mLs by mouth 4 (four) times daily as needed for mouth pain. 400 mL 0   ondansetron (ZOFRAN) 8 MG tablet Take 1 tablet (8 mg total) by mouth every 8 (eight) hours as needed for nausea or vomiting. 30 tablet 1   prochlorperazine (COMPAZINE) 10 MG tablet Take 1 tablet (10 mg total) by mouth every 6 (six) hours as needed for nausea or vomiting. 30 tablet 1   Tiotropium Bromide-Olodaterol (STIOLTO RESPIMAT) 2.5-2.5 MCG/ACT AERS INHALE 2 PUFFS BY MOUTH  ONCE DAILY 4 g 11   rosuvastatin (CRESTOR) 10 MG tablet Take 1 tablet (10 mg total) by mouth daily. (Patient taking differently: Take 10 mg by mouth at bedtime.) 90 tablet 3   No current facility-administered medications for this visit.    HISTORY OF PRESENT ILLNESS:   Oncology History  Adenocarcinoma of upper lobe of right lung (HCC)  08/06/2023 Initial Diagnosis   Adenocarcinoma of upper lobe of right lung (HCC)   08/06/2023 Cancer Staging   Staging form: Lung, AJCC 8th Edition - Clinical: Stage IB (cT2a, cN0, cM0) - Signed by Si Gaul, MD on 08/06/2023   10/14/2023 Genetic Testing   Negative genetic testing on the CancerNext-Expanded+RNAinsight.  The report date is 10/13/2023.  The CancerNext-Expanded gene panel offered by [employer] and includes sequencing and rearrangement analysis for the following 71 genes: AIP, ALK, APC, ATM, BAP1, BARD1, BMPR1A, BRCA1, BRCA2, BRIP1, CDC73, CDH1, CDK4, CDKN1B, CDKN2A, CHEK2, DICER1, FH, FLCN,  KIF1B, LZTR1, MAX, MEN1, MET, MLH1, MSH2, MSH6, MUTYH, NF1, NF2, NTHL1, PALB2, PHOX2B, PMS2, POT1, PRKAR1A, PTCH1, PTEN, RAD51C, RAD51D, RB1, RET, SDHA, SDHAF2, SDHB, SDHC, SDHD, SMAD4, SMARCA4, SMARCB1, SMARCE1, STK11, SUFU, TMEM127, TP53, TSC1, TSC2 and VHL (sequencing and deletion/duplication); AXIN2, CTNNA1, EGFR, EGLN1, HOXB13, KIT, MITF, MSH3, PDGFRA, POLD1 and POLE (sequencing only); EPCAM and GREM1 (deletion/duplication only). RNA data is routinely analyzed for use in variant interpretation for all genes.    Pancreatic cancer (HCC)  10/07/2023 Cancer Staging   Staging form: Exocrine Pancreas, AJCC 8th Edition - Clinical stage from 10/07/2023: Stage IB (cT2, cN0, cM0) - Signed by Malachy Mood, MD on 10/25/2023 Total positive nodes: 0   10/25/2023 Initial Diagnosis   Pancreatic cancer (HCC)   12/04/2023 -  Chemotherapy   [name] : PANCREATIC Abraxane D1,8,15 + Gemcitabine D1,8,15 q28d         REVIEW OF SYSTEMS:   Constitutional: Denies fevers, chills or abnormal weight loss. Increased fatigue.  Eyes: Denies blurriness of vision Ears, nose, mouth, throat, and face: Denies mucositis or sore throat Respiratory: Denies cough, dyspnea or wheezes. Does get short of breath with prolonged activity.  Cardiovascular: Denies palpitation, chest discomfort or lower extremity swelling Gastrointestinal:  Denies nausea, heartburn or change in bowel habits Skin: Denies abnormal skin rashes Lymphatics: Denies new lymphadenopathy or easy bruising Neurological:Denies numbness, tingling or new weaknesses Behavioral/Psych: Mood is stable, no new changes  All other systems were reviewed with the patient and are negative.   VITALS:   Today's Vitals   01/30/24 1014  BP: 136/62  Pulse: 82  Resp: 20  Temp: 98 F (36.7 C)  TempSrc: Temporal  SpO2: 94%  Weight: 128 lb 12.8 oz (58.4 kg)  Height: 5\' 5"  (1.651 m)   Body mass index is 21.43 kg/m.   Wt Readings from Last 3  Encounters:  01/31/24 130 lb 6.4 oz (59.1 kg)  01/30/24 128 lb 12.8 oz (58.4 kg)  01/20/24 131 lb 2 oz (59.5 kg)    Body mass index is 21.43 kg/m.  Performance status (ECOG): 1 - Symptomatic but completely ambulatory  PHYSICAL EXAM:   GENERAL:alert, no distress and comfortable SKIN: skin color, texture, turgor are normal, no rashes or significant lesions EYES: normal, Conjunctiva are pink and non-injected, sclera clear OROPHARYNX:no exudate, no erythema and lips, buccal mucosa, and tongue normal  NECK: supple, thyroid normal size, non-tender, without nodularity LYMPH:  no palpable lymphadenopathy in the cervical, axillary or inguinal LUNGS: clear to auscultation and percussion with normal breathing effort  HEART: regular rate & rhythm and no murmurs and no lower extremity edema ABDOMEN:abdomen soft, non-tender and normal bowel sounds Musculoskeletal:no cyanosis of digits and no clubbing  NEURO: alert & oriented x 3 with fluent speech, no focal motor/sensory deficits  LABORATORY DATA:  I have reviewed the data as listed    Component Value Date/Time   NA 137 01/30/2024 0954   NA 137 05/08/2023 0953   K 3.7 01/30/2024 0954   CL 101 01/30/2024 0954   CO2 30 01/30/2024 0954   GLUCOSE 119 (H) 01/30/2024 0954   BUN 11 01/30/2024 0954   BUN 17 05/08/2023 0953   CREATININE 0.62 01/30/2024 0954   CREATININE 0.72 12/02/2017 0945   CALCIUM 9.0 01/30/2024 0954   PROT 7.0 01/30/2024 0954   PROT 7.1 05/08/2023 0953   ALBUMIN 3.9 01/30/2024 0954   ALBUMIN 4.2 05/08/2023 0953   AST 25 01/30/2024 0954   ALT 23 01/30/2024 0954   ALKPHOS 91 01/30/2024 0954   BILITOT 0.7 01/30/2024 0954   GFRNONAA >60 01/30/2024 0954   GFRAA 60 09/26/2020 1245   Lab Results  Component Value Date   WBC 5.3 01/30/2024   NEUTROABS 3.8 01/30/2024   HGB 11.2 (L) 01/30/2024   HCT 33.7 (L) 01/30/2024   MCV 93.6 01/30/2024   PLT 240 01/30/2024     RADIOGRAPHIC STUDIES: CT Chest Wo Contrast Result  Date: 01/17/2024 CLINICAL DATA:  Non-small cell lung cancer, monitor. History of right upper lobe adenocarcinoma. * Tracking Code: BO * EXAM: CT CHEST WITHOUT CONTRAST TECHNIQUE: Multidetector CT imaging of the chest was performed following the standard protocol without IV contrast. RADIATION DOSE REDUCTION: This exam was performed according to the departmental dose-optimization program which includes automated exposure control, adjustment of the mA and/or kV according to patient size and/or use of iterative reconstruction technique. COMPARISON:  CT 11/01/2023 and 07/03/2023.  PET-CT 06/10/2023. FINDINGS: Cardiovascular: Again demonstrated is at least moderate atherosclerosis of the aorta, great vessels and coronary arteries. Left subclavian Port-A-Cath extends to the lower SVC level. The heart size is normal. There is no pericardial effusion. Mediastinum/Nodes: There are no enlarged mediastinal, hilar or axillary lymph nodes.Hilar assessment is limited by the lack of intravenous contrast, although the hilar contours appear unchanged. The thyroid gland, trachea and esophagus demonstrate no significant findings. Lungs/Pleura: No pleural effusion or pneumothorax. Moderate to severe centrilobular emphysema with stable biapical scarring. The treated part solid lesion peripheral to the fiducial marker in the right upper lobe shows decreased density and size, measuring approximately 2.1 x 1.8 cm on image 79/6. Adjacent small solid nodule measuring 5 mm on image 1/6 is unchanged from multiple prior studies. Part solid lesion medially in the right lower lobe measures approximately 1.1 x 1.0 cm on image 112/6, similar to most recent examination, although enlarged from 07/03/2023. Part solid nodule posteriorly in the right lower lobe measures 1.1 x 0.9 cm on image 110/6, also similar to the most recent study and significantly enlarged from 07/03/2023. Part solid left lower lobe nodule measuring 1.5 x 1.4 cm on image 93/6  appears stable, also enlarged from 07/03/2023. Compared with the most recent prior study of 10 weeks ago, and no definite new or enlarging nodules. Upper abdomen: No acute findings are seen in the visualized upper abdomen. Persistent diffuse pancreatic ductal dilatation due to known mass in the pancreatic head, incompletely visualized. Musculoskeletal/Chest wall: There is no chest wall mass or suspicious osseous finding. Stable mild spondylosis. IMPRESSION: 1. The treated part solid lesion peripheral to  the fiducial marker in the right upper lobe shows decreased density and size, consistent with positive response to therapy. 2. The other part solid nodules in both lower lobes are similar to the most recent examination, although enlarged from 07/03/2023. These remain suspicious for multifocal adenocarcinoma. Attention on continued follow-up recommended. 3. No evidence of metastatic disease in the chest. 4. Persistent diffuse pancreatic ductal dilatation due to known mass in the pancreatic head, incompletely visualized on this chest CT. 5. Aortic Atherosclerosis (ICD10-I70.0) and Emphysema (ICD10-J43.9). Electronically Signed   By: Carey Bullocks M.D.   On: 01/17/2024 11:44

## 2024-01-29 NOTE — Assessment & Plan Note (Addendum)
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  -PET scan from June 10, 2023 showed a hypermetabolic 3.1 cm mass in the right upper lobe, and hypermetabolic soft tissue fullness in the pancreatic head, and partially solid and cystic 12.4 cm pelvic mass. 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 Pricilla Holm  -baseline CA19.9 33 -Pt has seen by pancreatobiliary surgeon Dr. Donell Beers, who recommends neoadjuvant chemotherapy due to the uncertainty of lung metastasis.  Patient declined surgery. -Repeated CT on November 01, 2023 showed improved treated lung mass, but several new or enlarging lung nodules, concerning for metastatic disease.  -Patient is agreeable with chemo, she started gemcitabine and Abraxane on 12/04/2023 -01/31/2024 is cycle 3 day 1  -NEW CT CAP has been ordered during today's visit and should be scheduled in the first 2 weeks of April.

## 2024-01-30 ENCOUNTER — Inpatient Hospital Stay: Attending: Gynecologic Oncology

## 2024-01-30 ENCOUNTER — Inpatient Hospital Stay: Admitting: Nurse Practitioner

## 2024-01-30 VITALS — BP 136/62 | HR 82 | Temp 98.0°F | Resp 20 | Ht 65.0 in | Wt 128.8 lb

## 2024-01-30 DIAGNOSIS — Z79631 Long term (current) use of antimetabolite agent: Secondary | ICD-10-CM | POA: Diagnosis not present

## 2024-01-30 DIAGNOSIS — C250 Malignant neoplasm of head of pancreas: Secondary | ICD-10-CM | POA: Insufficient documentation

## 2024-01-30 DIAGNOSIS — Z5111 Encounter for antineoplastic chemotherapy: Secondary | ICD-10-CM | POA: Diagnosis not present

## 2024-01-30 DIAGNOSIS — Z95828 Presence of other vascular implants and grafts: Secondary | ICD-10-CM

## 2024-01-30 DIAGNOSIS — Z79630 Long term (current) use of alkylating agent: Secondary | ICD-10-CM | POA: Insufficient documentation

## 2024-01-30 LAB — CBC WITH DIFFERENTIAL (CANCER CENTER ONLY)
Abs Immature Granulocytes: 0.03 10*3/uL (ref 0.00–0.07)
Basophils Absolute: 0 10*3/uL (ref 0.0–0.1)
Basophils Relative: 1 %
Eosinophils Absolute: 0.1 10*3/uL (ref 0.0–0.5)
Eosinophils Relative: 2 %
HCT: 33.7 % — ABNORMAL LOW (ref 36.0–46.0)
Hemoglobin: 11.2 g/dL — ABNORMAL LOW (ref 12.0–15.0)
Immature Granulocytes: 1 %
Lymphocytes Relative: 9 %
Lymphs Abs: 0.5 10*3/uL — ABNORMAL LOW (ref 0.7–4.0)
MCH: 31.1 pg (ref 26.0–34.0)
MCHC: 33.2 g/dL (ref 30.0–36.0)
MCV: 93.6 fL (ref 80.0–100.0)
Monocytes Absolute: 0.9 10*3/uL (ref 0.1–1.0)
Monocytes Relative: 16 %
Neutro Abs: 3.8 10*3/uL (ref 1.7–7.7)
Neutrophils Relative %: 71 %
Platelet Count: 240 10*3/uL (ref 150–400)
RBC: 3.6 MIL/uL — ABNORMAL LOW (ref 3.87–5.11)
RDW: 15.7 % — ABNORMAL HIGH (ref 11.5–15.5)
WBC Count: 5.3 10*3/uL (ref 4.0–10.5)
nRBC: 0 % (ref 0.0–0.2)

## 2024-01-30 LAB — CMP (CANCER CENTER ONLY)
ALT: 23 U/L (ref 0–44)
AST: 25 U/L (ref 15–41)
Albumin: 3.9 g/dL (ref 3.5–5.0)
Alkaline Phosphatase: 91 U/L (ref 38–126)
Anion gap: 6 (ref 5–15)
BUN: 11 mg/dL (ref 8–23)
CO2: 30 mmol/L (ref 22–32)
Calcium: 9 mg/dL (ref 8.9–10.3)
Chloride: 101 mmol/L (ref 98–111)
Creatinine: 0.62 mg/dL (ref 0.44–1.00)
GFR, Estimated: >60 mL/min (ref >60)
Glucose, Bld: 119 mg/dL — ABNORMAL HIGH (ref 70–99)
Potassium: 3.7 mmol/L (ref 3.5–5.1)
Sodium: 137 mmol/L (ref 135–145)
Total Bilirubin: 0.7 mg/dL (ref 0.0–1.2)
Total Protein: 7 g/dL (ref 6.5–8.1)

## 2024-01-30 MED ORDER — HEPARIN SOD (PORK) LOCK FLUSH 100 UNIT/ML IV SOLN
500.0000 [IU] | Freq: Once | INTRAVENOUS | Status: AC
  Administered 2024-01-30: 500 [IU]

## 2024-01-30 MED ORDER — SODIUM CHLORIDE 0.9% FLUSH
10.0000 mL | Freq: Once | INTRAVENOUS | Status: AC
  Administered 2024-01-30: 10 mL

## 2024-01-31 ENCOUNTER — Other Ambulatory Visit: Payer: Self-pay

## 2024-01-31 ENCOUNTER — Ambulatory Visit

## 2024-01-31 ENCOUNTER — Inpatient Hospital Stay

## 2024-01-31 VITALS — BP 136/58 | HR 80 | Temp 98.0°F | Resp 18 | Wt 130.4 lb

## 2024-01-31 DIAGNOSIS — C250 Malignant neoplasm of head of pancreas: Secondary | ICD-10-CM | POA: Diagnosis not present

## 2024-01-31 DIAGNOSIS — Z5111 Encounter for antineoplastic chemotherapy: Secondary | ICD-10-CM | POA: Diagnosis not present

## 2024-01-31 DIAGNOSIS — Z79631 Long term (current) use of antimetabolite agent: Secondary | ICD-10-CM | POA: Diagnosis not present

## 2024-01-31 DIAGNOSIS — Z79630 Long term (current) use of alkylating agent: Secondary | ICD-10-CM | POA: Diagnosis not present

## 2024-01-31 LAB — CANCER ANTIGEN 19-9: CA 19-9: 9 U/mL (ref 0–35)

## 2024-01-31 MED ORDER — SODIUM CHLORIDE 0.9 % IV SOLN
1000.0000 mg/m2 | Freq: Once | INTRAVENOUS | Status: AC
  Administered 2024-01-31: 1711 mg via INTRAVENOUS
  Filled 2024-01-31: qty 45

## 2024-01-31 MED ORDER — HEPARIN SOD (PORK) LOCK FLUSH 100 UNIT/ML IV SOLN
500.0000 [IU] | Freq: Once | INTRAVENOUS | Status: AC | PRN
  Administered 2024-01-31: 500 [IU]

## 2024-01-31 MED ORDER — PACLITAXEL PROTEIN-BOUND CHEMO INJECTION 100 MG
125.0000 mg/m2 | Freq: Once | INTRAVENOUS | Status: AC
  Administered 2024-01-31: 200 mg via INTRAVENOUS
  Filled 2024-01-31: qty 40

## 2024-01-31 MED ORDER — PROCHLORPERAZINE MALEATE 10 MG PO TABS: 10.0000 mg | ORAL_TABLET | Freq: Once | ORAL | Status: DC

## 2024-01-31 MED ORDER — SODIUM CHLORIDE 0.9% FLUSH
10.0000 mL | INTRAVENOUS | Status: DC | PRN
  Administered 2024-01-31: 10 mL

## 2024-01-31 MED ORDER — SODIUM CHLORIDE 0.9 % IV SOLN: INTRAVENOUS | Status: DC

## 2024-01-31 NOTE — Patient Instructions (Signed)
 [facility]  Discharge Instructions: Thank you for choosing [facility] to provide your oncology and hematology care.   If you have a lab appointment with the Cancer Center, please go directly to the Cancer Center and check in at the registration area.   Wear comfortable clothing and clothing appropriate for easy access to any Portacath or PICC line.   We strive to give you quality time with your provider. You may need to reschedule your appointment if you arrive late (15 or more minutes).  Arriving late affects you and other patients whose appointments are after yours.  Also, if you miss three or more appointments without notifying the office, you may be dismissed from the clinic at the provider's discretion.      For prescription refill requests, have your pharmacy contact our office and allow 72 hours for refills to be completed.    Today you received the following chemotherapy and/or immunotherapy agents: Abraxane, Gemcitabine.       To help prevent nausea and vomiting after your treatment, we encourage you to take your nausea medication as directed.  BELOW ARE SYMPTOMS THAT SHOULD BE REPORTED IMMEDIATELY: *FEVER GREATER THAN 100.4 F (38 C) OR HIGHER *CHILLS OR SWEATING *NAUSEA AND VOMITING THAT IS NOT CONTROLLED WITH YOUR NAUSEA MEDICATION *UNUSUAL SHORTNESS OF BREATH *UNUSUAL BRUISING OR BLEEDING *URINARY PROBLEMS (pain or burning when urinating, or frequent urination) *BOWEL PROBLEMS (unusual diarrhea, constipation, pain near the anus) TENDERNESS IN MOUTH AND THROAT WITH OR WITHOUT PRESENCE OF ULCERS (sore throat, sores in mouth, or a toothache) UNUSUAL RASH, SWELLING OR PAIN  UNUSUAL VAGINAL DISCHARGE OR ITCHING   Items with * indicate a potential emergency and should be followed up as soon as possible or go to the Emergency Department if any problems should occur.  Please show the CHEMOTHERAPY ALERT CARD or  IMMUNOTHERAPY ALERT CARD at check-in to the Emergency Department and triage nurse.  Should you have questions after your visit or need to cancel or reschedule your appointment, please contact [facility] - [facility] DE[facility] OF [facility]  Dept: [phone]  and follow the prompts.  Office hours are 8:00 a.m. to 4:30 p.m. Monday - Friday. Please note that voicemails left after 4:00 p.m. may not be returned until the following business day.  We are closed weekends and major holidays. You have access to a nurse at all times for urgent questions. Please call the main number to the clinic Dept: [phone] and follow the prompts.   For any non-urgent questions, you may also contact your provider using MyChart. We now offer e-Visits for anyone 62 and older to request care online for non-urgent symptoms. For details visit [email or web address].   Also download the MyChart app! Go to the app store, search "MyChart", open the app, select [place], and log in with your MyChart username and password.

## 2024-02-03 ENCOUNTER — Ambulatory Visit: Payer: Medicare Other

## 2024-02-03 ENCOUNTER — Ambulatory Visit: Payer: Medicare Other | Admitting: Hematology

## 2024-02-03 ENCOUNTER — Other Ambulatory Visit: Payer: Medicare Other

## 2024-02-05 ENCOUNTER — Encounter: Payer: Self-pay | Admitting: Hematology

## 2024-02-05 ENCOUNTER — Encounter: Payer: Self-pay | Admitting: Nurse Practitioner

## 2024-02-11 ENCOUNTER — Other Ambulatory Visit (HOSPITAL_BASED_OUTPATIENT_CLINIC_OR_DEPARTMENT_OTHER): Payer: Self-pay | Admitting: Pulmonary Disease

## 2024-02-11 NOTE — Progress Notes (Unsigned)
 [facility]   [phone] [phone]   Clinic Follow up Note   Patient Care Team: Joselyn Arrow, MD as PCP - General (Family Medicine) Luciano Cutter, MD as Consulting Physician (Pulmonary Disease) Antonietta Barcelona, OD as Referring Physician (Optometry) Clyda Hurdle (Physician Assistant) Malachy Mood, MD as Consulting Physician (Hematology and Oncology) Carlean Jews, NP as Nurse Practitioner (Hematology and Oncology)  Date of Service:  02/12/2024  CHIEF COMPLAINT: f/u of pancreatic cancer  CURRENT THERAPY:  New adjuvant chemotherapy gemcitabine and Abraxane every 2 weeks  Oncology History   Pancreatic cancer (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  -PET scan from June 10, 2023 showed a hypermetabolic 3.1 cm mass in the right upper lobe, and hypermetabolic soft tissue fullness in the pancreatic head, and partially solid and cystic 12.4 cm pelvic mass. 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 Pricilla Holm  -baseline CA19.9 33 -Pt has seen by pancreatobiliary surgeon Dr. Donell Beers, who recommends neoadjuvant chemotherapy due to the uncertainty of lung metastasis.  Patient declined surgery. -Repeated CT on November 01, 2023 showed improved treated lung mass, but several new or enlarging lung nodules, concerning for metastatic disease.  -Patient is agreeable with chemo, she started gemcitabine and Abraxane on 12/04/2023  Assessment and Plan    Pancreatic cancer Currently undergoing the sixth cycle of chemotherapy for pancreatic cancer. Reports increased shortness of breath since the last session, likely due to cumulative chemotherapy effects. Experiencing fatigue, common at this treatment stage. Blood counts are stable;  kidney and liver function tests are pending. Chemotherapy is held today due to potential COVID-19 exposure and symptoms. - Hold chemotherapy treatment today - Schedule CT scan for April 10 after the next cycle  Shortness of breath Increased shortness of breath, particularly after the last chemotherapy session. Oxygen saturation was 92% this morning and 93% during the visit. Uses supplemental oxygen at home, primarily at night and as needed during the day. Shortness of breath may be related to chemotherapy or potential COVID-19 infection, given husband's recent COVID-19 diagnosis and hospitalization. Discussed emergency department evaluation, including chest X-ray and COVID-19 test. If COVID-19 is ruled out, consider a chemotherapy break to improve symptoms. - Send to the emergency department for evaluation, including chest X-ray and COVID-19 test - Consider hospital admission if COVID-19 test is positive and oxygen levels remain low  COVID-19 exposure Husband is hospitalized with COVID-19, increasing her exposure risk. She has not been tested for COVID-19 and reports no fever or chills, but infection likelihood is high given symptoms and exposure history. Discussed importance of testing for COVID-19 at home and in the emergency department. - Advise to test for COVID-19 at home - Send to the emergency department for COVID-19 testing  Plan -Due to her dyspnea and hypoxia on room air, which is new, and her recent COVID exposure, I recommend emergency room evaluation -Cancel chemotherapy today -Follow-up in 2 weeks before next cycle chemotherapy.        SUMMARY OF ONCOLOGIC HISTORY: Oncology History  Adenocarcinoma of upper lobe of right lung (HCC)  08/06/2023 Initial Diagnosis   Adenocarcinoma of upper lobe of right lung (HCC)   08/06/2023 Cancer Staging   Staging form: Lung, AJCC 8th Edition - Clinical: Stage IB (cT2a, cN0, cM0) - Signed  by Si Gaul, MD on 08/06/2023   10/14/2023  Genetic Testing   Negative genetic testing on the CancerNext-Expanded+RNAinsight.  The report date is 10/13/2023.  The CancerNext-Expanded gene panel offered by [employer] and includes sequencing and rearrangement analysis for the following 71 genes: AIP, ALK, APC, ATM, BAP1, BARD1, BMPR1A, BRCA1, BRCA2, BRIP1, CDC73, CDH1, CDK4, CDKN1B, CDKN2A, CHEK2, DICER1, FH, FLCN, KIF1B, LZTR1, MAX, MEN1, MET, MLH1, MSH2, MSH6, MUTYH, NF1, NF2, NTHL1, PALB2, PHOX2B, PMS2, POT1, PRKAR1A, PTCH1, PTEN, RAD51C, RAD51D, RB1, RET, SDHA, SDHAF2, SDHB, SDHC, SDHD, SMAD4, SMARCA4, SMARCB1, SMARCE1, STK11, SUFU, TMEM127, TP53, TSC1, TSC2 and VHL (sequencing and deletion/duplication); AXIN2, CTNNA1, EGFR, EGLN1, HOXB13, KIT, MITF, MSH3, PDGFRA, POLD1 and POLE (sequencing only); EPCAM and GREM1 (deletion/duplication only). RNA data is routinely analyzed for use in variant interpretation for all genes.    Pancreatic cancer (HCC)  10/07/2023 Cancer Staging   Staging form: Exocrine Pancreas, AJCC 8th Edition - Clinical stage from 10/07/2023: Stage IB (cT2, cN0, cM0) - Signed by Malachy Mood, MD on 10/25/2023 Total positive nodes: 0   10/25/2023 Initial Diagnosis   Pancreatic cancer (HCC)   12/04/2023 -  Chemotherapy   [name] : PANCREATIC Abraxane D1,8,15 + Gemcitabine D1,8,15 q28d        Discussed the use of AI scribe software for clinical note transcription with the patient, who gave verbal consent to proceed.  History of Present Illness   The patient, a 78 year old Tiffany [name] with a history of pancreatic cancer, presents with new onset shortness of breath. She reports that her breathing has been 'shallow or short' since her last chemotherapy treatment. She has been using oxygen at home, initially only at night, but now also during the day as needed. She reports that her oxygen level at home was 92% this morning.  The patient's husband, with whom she lives, was recently hospitalized with COVID-19.  The patient has not been tested for COVID-19 and denies fever and chills. She reports some cough and phlegm production.  The patient also reports decreased appetite and difficulty eating, which sometimes makes her feel 'driven.' She is able to drink water and keep it down. She is currently living alone, with her son visiting occasionally.         All other systems were reviewed with the patient and are negative.  MEDICAL HISTORY:  Past Medical History:  Diagnosis Date   Abnormal PFT 09/02/2014   moderate restrictive ventilatory defect.   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 Terri Piedra   Colon polyp 12/2003   Diverticulosis 11/2010   seen on colonoscopy and air contrast BE   Emphysema of lung (HCC)    Family history of breast cancer    GERD (gastroesophageal reflux disease)    History of radiation therapy    Right Lung-09/04/23-09/09/23- Dr. Antony Blackbird   Hyperlipidemia    Hypertension    previously treated with meds, resolved   MGUS (monoclonal gammopathy of unknown significance) 1999   prevously under care of Dr. Myrle Sheng   On home oxygen therapy    at [facility]   Osteopenia    DEXA 01/31/2010 at 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   Tobacco abuse    quit in 2019    SURGICAL HISTORY: Past Surgical History:  Procedure Laterality Date   BASAL CELL CARCINOMA EXCISION  11/26/2008   L face, R temple   BILATERAL SALPINGOOPHORECTOMY  11/26/2005   cancerous tumor R ovary   BIOPSY  10/07/2023   Procedure: BIOPSY;  Surgeon: Lemar Lofty., MD;  Location: [facility] ENDOSCOPY;  Service: Gastroenterology;;   BRONCHIAL BIOPSY  07/09/2023    Procedure: BRONCHIAL BIOPSIES;  Surgeon: Leslye Peer, MD;  Location: [facility] ENDOSCOPY;  Service: Pulmonary;;   BRONCHIAL BRUSHINGS  07/09/2023   Procedure: BRONCHIAL BRUSHINGS;  Surgeon: Leslye Peer, MD;  Location: [facility] ENDOSCOPY;  Service: Pulmonary;;   BRONCHIAL NEEDLE ASPIRATION BIOPSY  07/09/2023   Procedure: BRONCHIAL NEEDLE ASPIRATION BIOPSIES;  Surgeon: Leslye Peer, MD;  Location: [facility] ENDOSCOPY;  Service: Pulmonary;;   CATARACT EXTRACTION  11/27/2007   bilateral   COLONOSCOPY  11/26/2010   Dr. Laural Benes. Diverticulosis. BE also done--normal   ESOPHAGOGASTRODUODENOSCOPY (EGD) WITH PROPOFOL N/A 10/07/2023   Procedure: ESOPHAGOGASTRODUODENOSCOPY (EGD) WITH PROPOFOL;  Surgeon: Meridee Score Netty Starring., MD;  Location: [facility] ENDOSCOPY;  Service: Gastroenterology;  Laterality: N/A;   EUS N/A 10/07/2023   Procedure: UPPER ENDOSCOPIC ULTRASOUND (EUS) RADIAL;  Surgeon: Lemar Lofty., MD;  Location: [facility] ENDOSCOPY;  Service: Gastroenterology;  Laterality: N/A;   FIDUCIAL MARKER PLACEMENT  07/09/2023   Procedure: FIDUCIAL MARKER PLACEMENT;  Surgeon: Leslye Peer, MD;  Location: [facility] ENDOSCOPY;  Service: Pulmonary;;   FINE NEEDLE ASPIRATION N/A 10/07/2023   Procedure: FINE NEEDLE ASPIRATION (FNA) LINEAR;  Surgeon: Lemar Lofty., MD;  Location: [facility] ENDOSCOPY;  Service: Gastroenterology;  Laterality: N/A;   OMENTECTOMY  11/26/2005   and pelvic lymphadenectomy (with BSO)   PORTACATH PLACEMENT N/A 11/22/2023   Procedure: INSERTION PORT-A-CATH;  Surgeon: [name], MD;  Location: [facility] OR;  Service: General;  Laterality: N/A;   ROBOTIC ASSISTED LAPAROSCOPIC LYSIS OF ADHESION N/A 07/18/2023   Procedure: ROBOTIC ASSISTED PELVIC MASS EXCISION WITH RADICAL DISSECTION, MINI LAPAROTOMY, CYSTOSCOPY;  Surgeon: Carver Fila, MD;  Location: [facility] ORS;  Service: Gynecology;  Laterality: N/A;   SKIN CANCER EXCISION Left 07/2021   neck; pt can't recall type of cancer   TONSILLECTOMY  age 25    VAGINAL HYSTERECTOMY  late 20's   "didn't want any more children"    I have reviewed the social history and family history with the patient and they are unchanged from previous note.  ALLERGIES:  has no known allergies.  MEDICATIONS:  No current facility-administered medications for this visit.   No current outpatient medications on file.   Facility-Administered Medications Ordered in Other Visits  Medication Dose Route Frequency Provider Last Rate Last Admin   0.9 %  sodium chloride infusion   Intravenous Continuous Willeen Niece, MD 40 mL/hr at 02/12/24 1912 New Bag at 02/12/24 1912   acetaminophen (TYLENOL) tablet 650 mg  650 mg Oral Q6H PRN Willeen Niece, MD       Or   acetaminophen (TYLENOL) suppository 650 mg  650 mg Rectal Q6H PRN Willeen Niece, MD       [START ON 02/13/2024] albuterol (PROVENTIL) (2.5 MG/3ML) 0.083% nebulizer solution 2.5 mg  2.5 mg Nebulization BID Willeen Niece, MD       arformoterol (BROVANA) nebulizer solution 15 mcg  15 mcg Nebulization BID Willeen Niece, MD   15 mcg at 02/12/24 2018   And   umeclidinium bromide (INCRUSE ELLIPTA)  62.5 MCG/ACT 1 puff  1 puff Inhalation Daily Willeen Niece, MD       docusate sodium (COLACE) capsule 100 mg  100 mg Oral BID Willeen Niece, MD   100 mg at 02/12/24 2127   enoxaparin (LOVENOX) injection 30 mg  30 mg Subcutaneous Q24H Willeen Niece, MD   30 mg at 02/12/24 1820   guaiFENesin (MUCINEX) 12 hr tablet 600 mg  600 mg Oral BID Willeen Niece, MD   600 mg at 02/12/24 2127   losartan (COZAAR) tablet 50 mg  50 mg Oral Daily Willeen Niece, MD       methylPREDNISolone sodium succinate (SOLU-MEDROL) 40 mg/mL injection 40 mg  40 mg Intravenous Q12H Willeen Niece, MD   40 mg at 02/12/24 1821   ondansetron (ZOFRAN) tablet 4 mg  4 mg Oral Q6H PRN Willeen Niece, MD       Or   ondansetron (ZOFRAN) injection 4 mg  4 mg Intravenous Q6H PRN Willeen Niece, MD       rosuvastatin (CRESTOR) tablet 10 mg  10 mg Oral  QHS Willeen Niece, MD   10 mg at 02/12/24 2127    PHYSICAL EXAMINATION: ECOG PERFORMANCE STATUS: 2 - Symptomatic, <50% confined to bed  Vitals:   02/12/24 1240  BP: (!) 122/58  Pulse: 94  Resp: (!) 23  Temp: 97.7 F (36.5 C)  SpO2: (!) 87%   Wt Readings from Last 3 Encounters:  02/12/24 127 lb 8 oz (57.8 kg)  01/31/24 130 lb 6.4 oz (59.1 kg)  01/30/24 128 lb 12.8 oz (58.4 kg)     GENERAL:alert, no distress and comfortable SKIN: skin color, texture, turgor are normal, no rashes or significant lesions EYES: normal, Conjunctiva are pink and non-injected, sclera clear NECK: supple, thyroid normal size, non-tender, without nodularity LYMPH:  no palpable lymphadenopathy in the cervical, axillary  LUNGS: clear to auscultation and percussion with normal breathing effort HEART: regular rate & rhythm and no murmurs and no lower extremity edema ABDOMEN:abdomen soft, non-tender and normal bowel sounds Musculoskeletal:no cyanosis of digits and no clubbing  NEURO: alert & oriented x 3 with fluent speech, no focal motor/sensory deficits    LABORATORY DATA:  I have reviewed the data as listed    Latest Ref Rng & Units 02/12/2024    2:34 PM 02/12/2024   12:23 PM 01/30/2024    9:54 AM  CBC  WBC 4.0 - 10.5 K/uL 3.5  3.6  5.3   Hemoglobin 12.0 - 15.0 g/dL 9.7  19.1  47.8   Hematocrit 36.0 - 46.0 % 30.6  32.1  33.7   Platelets 150 - 400 K/uL 174  176  240         Latest Ref Rng & Units 02/12/2024    2:34 PM 02/12/2024   12:23 PM 01/30/2024    9:54 AM  CMP  Glucose 70 - 99 mg/dL 295  621  308   BUN 8 - 23 mg/dL 12  12  11    Creatinine 0.44 - 1.00 mg/dL 6.57  8.46  9.62   Sodium 135 - 145 mmol/L 130  133  137   Potassium 3.5 - 5.1 mmol/L 3.6  3.9  3.7   Chloride 98 - 111 mmol/L 94  95  101   CO2 22 - 32 mmol/L 28  31  30    Calcium 8.9 - 10.3 mg/dL 7.9  8.7  9.0   Total Protein 6.5 - 8.1 g/dL 6.4  7.0  7.0   Total Bilirubin  0.0 - 1.2 mg/dL 0.7  0.7  0.7   Alkaline Phos 38 - 126 U/L  73  85  91   AST 15 - 41 U/L 20  19  25    ALT 0 - 44 U/L 18  17  23        RADIOGRAPHIC STUDIES: I have personally reviewed the radiological images as listed and agreed with the findings in the report. DG Chest Portable 1 View Result Date: 02/12/2024 CLINICAL DATA:  Chest discomfort, shortness of breath and cough. History of non-small-cell lung cancer. EXAM: PORTABLE CHEST 1 VIEW COMPARISON:  Noncontrast CT 01/10/2024.  X-ray 11/22/2023. FINDINGS: Normal cardiopericardial silhouette. Calcified aorta. Left subclavian chest port with tip along the central SVC. Hyperinflation with diffuse interstitial changes. Apical pleural thickening. There is a marker in the right midthorax with some subtle asymmetric nodular density. Please correlate with prior evaluation. No consolidation or effusion. Overlapping cardiac leads. IMPRESSION: Hyperinflation with chronic changes. Stable slight density with a marker in the right midthorax. Chest port. Electronically Signed   By: Karen Kays M.D.   On: 02/12/2024 16:21      No orders of the defined types were placed in this encounter.  All questions were answered. The patient knows to call the clinic with any problems, questions or concerns. No barriers to learning was detected. The total time spent in the appointment was 30 minutes.     Malachy Mood, MD 02/12/2024

## 2024-02-11 NOTE — Assessment & Plan Note (Signed)
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  -PET scan from June 10, 2023 showed a hypermetabolic 3.1 cm mass in the right upper lobe, and hypermetabolic soft tissue fullness in the pancreatic head, and partially solid and cystic 12.4 cm pelvic mass. 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 Pricilla Holm  -baseline CA19.9 33 -Pt has seen by pancreatobiliary surgeon Dr. Donell Beers, who recommends neoadjuvant chemotherapy due to the uncertainty of lung metastasis.  Patient declined surgery. -Repeated CT on November 01, 2023 showed improved treated lung mass, but several new or enlarging lung nodules, concerning for metastatic disease.  -Patient is agreeable with chemo, she started gemcitabine and Abraxane on 12/04/2023

## 2024-02-12 ENCOUNTER — Inpatient Hospital Stay (HOSPITAL_COMMUNITY)
Admission: EM | Admit: 2024-02-12 | Discharge: 2024-02-15 | DRG: 177 | Disposition: A | Discharge status: Home / Self Care | Attending: Family Medicine | Admitting: Family Medicine

## 2024-02-12 ENCOUNTER — Other Ambulatory Visit: Payer: Self-pay

## 2024-02-12 ENCOUNTER — Inpatient Hospital Stay (HOSPITAL_BASED_OUTPATIENT_CLINIC_OR_DEPARTMENT_OTHER): Payer: Medicare Other | Admitting: Hematology

## 2024-02-12 ENCOUNTER — Inpatient Hospital Stay: Payer: Medicare Other

## 2024-02-12 ENCOUNTER — Encounter: Payer: Self-pay | Admitting: Hematology

## 2024-02-12 ENCOUNTER — Encounter (HOSPITAL_COMMUNITY): Payer: Self-pay | Admitting: Family Medicine

## 2024-02-12 ENCOUNTER — Emergency Department (HOSPITAL_COMMUNITY)

## 2024-02-12 VITALS — BP 122/58 | HR 94 | Temp 97.7°F | Resp 23 | Ht 65.0 in | Wt 127.5 lb

## 2024-02-12 DIAGNOSIS — J439 Emphysema, unspecified: Secondary | ICD-10-CM | POA: Diagnosis not present

## 2024-02-12 DIAGNOSIS — Z8249 Family history of ischemic heart disease and other diseases of the circulatory system: Secondary | ICD-10-CM

## 2024-02-12 DIAGNOSIS — M8580 Other specified disorders of bone density and structure, unspecified site: Secondary | ICD-10-CM | POA: Diagnosis not present

## 2024-02-12 DIAGNOSIS — E7800 Pure hypercholesterolemia, unspecified: Secondary | ICD-10-CM | POA: Diagnosis not present

## 2024-02-12 DIAGNOSIS — E860 Dehydration: Secondary | ICD-10-CM | POA: Diagnosis present

## 2024-02-12 DIAGNOSIS — E871 Hypo-osmolality and hyponatremia: Secondary | ICD-10-CM | POA: Diagnosis not present

## 2024-02-12 DIAGNOSIS — Z90710 Acquired absence of both cervix and uterus: Secondary | ICD-10-CM | POA: Diagnosis not present

## 2024-02-12 DIAGNOSIS — D472 Monoclonal gammopathy: Secondary | ICD-10-CM | POA: Diagnosis present

## 2024-02-12 DIAGNOSIS — J449 Chronic obstructive pulmonary disease, unspecified: Secondary | ICD-10-CM | POA: Diagnosis present

## 2024-02-12 DIAGNOSIS — Z9842 Cataract extraction status, left eye: Secondary | ICD-10-CM

## 2024-02-12 DIAGNOSIS — J9621 Acute and chronic respiratory failure with hypoxia: Secondary | ICD-10-CM | POA: Diagnosis present

## 2024-02-12 DIAGNOSIS — Z923 Personal history of irradiation: Secondary | ICD-10-CM | POA: Diagnosis not present

## 2024-02-12 DIAGNOSIS — R0602 Shortness of breath: Secondary | ICD-10-CM | POA: Diagnosis not present

## 2024-02-12 DIAGNOSIS — R0902 Hypoxemia: Secondary | ICD-10-CM

## 2024-02-12 DIAGNOSIS — Z79899 Other long term (current) drug therapy: Secondary | ICD-10-CM | POA: Diagnosis not present

## 2024-02-12 DIAGNOSIS — Z95828 Presence of other vascular implants and grafts: Secondary | ICD-10-CM

## 2024-02-12 DIAGNOSIS — C250 Malignant neoplasm of head of pancreas: Secondary | ICD-10-CM

## 2024-02-12 DIAGNOSIS — J440 Chronic obstructive pulmonary disease with acute lower respiratory infection: Secondary | ICD-10-CM | POA: Diagnosis not present

## 2024-02-12 DIAGNOSIS — J1282 Pneumonia due to coronavirus disease 2019: Secondary | ICD-10-CM | POA: Diagnosis not present

## 2024-02-12 DIAGNOSIS — R051 Acute cough: Secondary | ICD-10-CM

## 2024-02-12 DIAGNOSIS — C259 Malignant neoplasm of pancreas, unspecified: Secondary | ICD-10-CM | POA: Diagnosis present

## 2024-02-12 DIAGNOSIS — Z9981 Dependence on supplemental oxygen: Secondary | ICD-10-CM

## 2024-02-12 DIAGNOSIS — R0789 Other chest pain: Secondary | ICD-10-CM | POA: Diagnosis not present

## 2024-02-12 DIAGNOSIS — J441 Chronic obstructive pulmonary disease with (acute) exacerbation: Secondary | ICD-10-CM | POA: Diagnosis present

## 2024-02-12 DIAGNOSIS — R059 Cough, unspecified: Secondary | ICD-10-CM | POA: Diagnosis not present

## 2024-02-12 DIAGNOSIS — Z9841 Cataract extraction status, right eye: Secondary | ICD-10-CM

## 2024-02-12 DIAGNOSIS — Z860100 Personal history of colon polyps, unspecified: Secondary | ICD-10-CM

## 2024-02-12 DIAGNOSIS — Z85828 Personal history of other malignant neoplasm of skin: Secondary | ICD-10-CM

## 2024-02-12 DIAGNOSIS — Z801 Family history of malignant neoplasm of trachea, bronchus and lung: Secondary | ICD-10-CM

## 2024-02-12 DIAGNOSIS — I10 Essential (primary) hypertension: Secondary | ICD-10-CM | POA: Diagnosis present

## 2024-02-12 DIAGNOSIS — U071 COVID-19: Secondary | ICD-10-CM | POA: Diagnosis not present

## 2024-02-12 DIAGNOSIS — C3411 Malignant neoplasm of upper lobe, right bronchus or lung: Secondary | ICD-10-CM | POA: Diagnosis present

## 2024-02-12 DIAGNOSIS — G4734 Idiopathic sleep related nonobstructive alveolar hypoventilation: Secondary | ICD-10-CM | POA: Diagnosis present

## 2024-02-12 DIAGNOSIS — Z87891 Personal history of nicotine dependence: Secondary | ICD-10-CM

## 2024-02-12 DIAGNOSIS — J9601 Acute respiratory failure with hypoxia: Secondary | ICD-10-CM | POA: Diagnosis present

## 2024-02-12 DIAGNOSIS — I700 Atherosclerosis of aorta: Secondary | ICD-10-CM | POA: Diagnosis not present

## 2024-02-12 LAB — CBC WITH DIFFERENTIAL/PLATELET
Abs Immature Granulocytes: 0.02 10*3/uL (ref 0.00–0.07)
Basophils Absolute: 0 10*3/uL (ref 0.0–0.1)
Basophils Relative: 1 %
Eosinophils Absolute: 0.1 10*3/uL (ref 0.0–0.5)
Eosinophils Relative: 2 %
HCT: 30.6 % — ABNORMAL LOW (ref 36.0–46.0)
Hemoglobin: 9.7 g/dL — ABNORMAL LOW (ref 12.0–15.0)
Immature Granulocytes: 1 %
Lymphocytes Relative: 10 %
Lymphs Abs: 0.3 10*3/uL — ABNORMAL LOW (ref 0.7–4.0)
MCH: 31.1 pg (ref 26.0–34.0)
MCHC: 31.7 g/dL (ref 30.0–36.0)
MCV: 98.1 fL (ref 80.0–100.0)
Monocytes Absolute: 0.8 10*3/uL (ref 0.1–1.0)
Monocytes Relative: 22 %
Neutro Abs: 2.3 10*3/uL (ref 1.7–7.7)
Neutrophils Relative %: 64 %
Platelets: 174 10*3/uL (ref 150–400)
RBC: 3.12 MIL/uL — ABNORMAL LOW (ref 3.87–5.11)
RDW: 15.9 % — ABNORMAL HIGH (ref 11.5–15.5)
WBC: 3.5 10*3/uL — ABNORMAL LOW (ref 4.0–10.5)
nRBC: 0 % (ref 0.0–0.2)

## 2024-02-12 LAB — CMP (CANCER CENTER ONLY)
ALT: 17 U/L (ref 0–44)
AST: 19 U/L (ref 15–41)
Albumin: 3.6 g/dL (ref 3.5–5.0)
Alkaline Phosphatase: 85 U/L (ref 38–126)
Anion gap: 7 (ref 5–15)
BUN: 12 mg/dL (ref 8–23)
CO2: 31 mmol/L (ref 22–32)
Calcium: 8.7 mg/dL — ABNORMAL LOW (ref 8.9–10.3)
Chloride: 95 mmol/L — ABNORMAL LOW (ref 98–111)
Creatinine: 0.53 mg/dL (ref 0.44–1.00)
GFR, Estimated: >60 mL/min (ref >60)
Glucose, Bld: 113 mg/dL — ABNORMAL HIGH (ref 70–99)
Potassium: 3.9 mmol/L (ref 3.5–5.1)
Sodium: 133 mmol/L — ABNORMAL LOW (ref 135–145)
Total Bilirubin: 0.7 mg/dL (ref 0.0–1.2)
Total Protein: 7 g/dL (ref 6.5–8.1)

## 2024-02-12 LAB — COMPREHENSIVE METABOLIC PANEL
ALT: 18 U/L (ref 0–44)
AST: 20 U/L (ref 15–41)
Albumin: 3 g/dL — ABNORMAL LOW (ref 3.5–5.0)
Alkaline Phosphatase: 73 U/L (ref 38–126)
Anion gap: 8 (ref 5–15)
BUN: 12 mg/dL (ref 8–23)
CO2: 28 mmol/L (ref 22–32)
Calcium: 7.9 mg/dL — ABNORMAL LOW (ref 8.9–10.3)
Chloride: 94 mmol/L — ABNORMAL LOW (ref 98–111)
Creatinine, Ser: 0.52 mg/dL (ref 0.44–1.00)
GFR, Estimated: >60 mL/min (ref >60)
Glucose, Bld: 110 mg/dL — ABNORMAL HIGH (ref 70–99)
Potassium: 3.6 mmol/L (ref 3.5–5.1)
Sodium: 130 mmol/L — ABNORMAL LOW (ref 135–145)
Total Bilirubin: 0.7 mg/dL (ref 0.0–1.2)
Total Protein: 6.4 g/dL — ABNORMAL LOW (ref 6.5–8.1)

## 2024-02-12 LAB — TROPONIN I (HIGH SENSITIVITY)
Troponin I (High Sensitivity): 12 ng/L (ref <18)
Troponin I (High Sensitivity): 10 ng/L (ref <18)

## 2024-02-12 LAB — CBC WITH DIFFERENTIAL (CANCER CENTER ONLY)
Abs Immature Granulocytes: 0.01 10*3/uL (ref 0.00–0.07)
Basophils Absolute: 0 10*3/uL (ref 0.0–0.1)
Basophils Relative: 0 %
Eosinophils Absolute: 0.1 10*3/uL (ref 0.0–0.5)
Eosinophils Relative: 1 %
HCT: 32.1 % — ABNORMAL LOW (ref 36.0–46.0)
Hemoglobin: 10.5 g/dL — ABNORMAL LOW (ref 12.0–15.0)
Immature Granulocytes: 0 %
Lymphocytes Relative: 11 %
Lymphs Abs: 0.4 10*3/uL — ABNORMAL LOW (ref 0.7–4.0)
MCH: 30.7 pg (ref 26.0–34.0)
MCHC: 32.7 g/dL (ref 30.0–36.0)
MCV: 93.9 fL (ref 80.0–100.0)
Monocytes Absolute: 0.7 10*3/uL (ref 0.1–1.0)
Monocytes Relative: 20 %
Neutro Abs: 2.4 10*3/uL (ref 1.7–7.7)
Neutrophils Relative %: 68 %
Platelet Count: 176 10*3/uL (ref 150–400)
RBC: 3.42 MIL/uL — ABNORMAL LOW (ref 3.87–5.11)
RDW: 15.7 % — ABNORMAL HIGH (ref 11.5–15.5)
WBC Count: 3.6 10*3/uL — ABNORMAL LOW (ref 4.0–10.5)
nRBC: 0 % (ref 0.0–0.2)

## 2024-02-12 LAB — RESP PANEL BY RT-PCR (RSV, FLU A&B, COVID)  RVPGX2
Influenza A by PCR: NEGATIVE
Influenza B by PCR: NEGATIVE
Resp Syncytial Virus by PCR: NEGATIVE
SARS Coronavirus 2 by RT PCR: POSITIVE — AB

## 2024-02-12 LAB — LACTIC ACID, PLASMA
Lactic Acid, Venous: 0.8 mmol/L (ref 0.5–1.9)
Lactic Acid, Venous: 0.9 mmol/L (ref 0.5–1.9)

## 2024-02-12 LAB — BRAIN NATRIURETIC PEPTIDE: B Natriuretic Peptide: 80.8 pg/mL (ref 0.0–100.0)

## 2024-02-12 MED ORDER — LOSARTAN POTASSIUM 50 MG PO TABS
50.0000 mg | ORAL_TABLET | Freq: Every day | ORAL | Status: DC
  Administered 2024-02-13 – 2024-02-15 (×3): 50 mg via ORAL
  Filled 2024-02-12 (×3): qty 1

## 2024-02-12 MED ORDER — ACETAMINOPHEN 650 MG RE SUPP: 650.0000 mg | Freq: Four times a day (QID) | RECTAL | Status: DC | PRN

## 2024-02-12 MED ORDER — DOCUSATE SODIUM 100 MG PO CAPS
100.0000 mg | ORAL_CAPSULE | Freq: Two times a day (BID) | ORAL | Status: DC
  Administered 2024-02-12 – 2024-02-14 (×4): 100 mg via ORAL
  Filled 2024-02-12 (×6): qty 1

## 2024-02-12 MED ORDER — METHYLPREDNISOLONE SODIUM SUCC 40 MG IJ SOLR
40.0000 mg | Freq: Two times a day (BID) | INTRAMUSCULAR | Status: DC
  Administered 2024-02-12 – 2024-02-13 (×3): 40 mg via INTRAVENOUS
  Filled 2024-02-12 (×4): qty 1

## 2024-02-12 MED ORDER — ONDANSETRON HCL 4 MG/2ML IJ SOLN: 4.0000 mg | Freq: Four times a day (QID) | INTRAMUSCULAR | Status: DC | PRN

## 2024-02-12 MED ORDER — GUAIFENESIN ER 600 MG PO TB12
600.0000 mg | ORAL_TABLET | Freq: Two times a day (BID) | ORAL | Status: DC
  Administered 2024-02-12 – 2024-02-15 (×6): 600 mg via ORAL
  Filled 2024-02-12 (×6): qty 1

## 2024-02-12 MED ORDER — ROSUVASTATIN CALCIUM 20 MG PO TABS
10.0000 mg | ORAL_TABLET | Freq: Every day | ORAL | Status: DC
  Administered 2024-02-12 – 2024-02-14 (×3): 10 mg via ORAL
  Filled 2024-02-12: qty 1
  Filled 2024-02-12: qty 0.5
  Filled 2024-02-12: qty 1
  Filled 2024-02-12: qty 0.5
  Filled 2024-02-12: qty 1
  Filled 2024-02-12: qty 0.5

## 2024-02-12 MED ORDER — ONDANSETRON HCL 4 MG PO TABS: 4.0000 mg | ORAL_TABLET | Freq: Four times a day (QID) | ORAL | Status: DC | PRN

## 2024-02-12 MED ORDER — ENOXAPARIN SODIUM 30 MG/0.3ML IJ SOSY
30.0000 mg | PREFILLED_SYRINGE | INTRAMUSCULAR | Status: DC
  Administered 2024-02-12 – 2024-02-14 (×3): 30 mg via SUBCUTANEOUS
  Filled 2024-02-12 (×3): qty 0.3

## 2024-02-12 MED ORDER — ARFORMOTEROL TARTRATE 15 MCG/2ML IN NEBU
15.0000 ug | INHALATION_SOLUTION | Freq: Two times a day (BID) | RESPIRATORY_TRACT | Status: DC
  Administered 2024-02-12 – 2024-02-15 (×6): 15 ug via RESPIRATORY_TRACT
  Filled 2024-02-12 (×6): qty 2

## 2024-02-12 MED ORDER — ACETAMINOPHEN 500 MG PO TABS: 1000.0000 mg | ORAL_TABLET | Freq: Three times a day (TID) | ORAL | Status: DC | PRN

## 2024-02-12 MED ORDER — UMECLIDINIUM BROMIDE 62.5 MCG/ACT IN AEPB
1.0000 | INHALATION_SPRAY | Freq: Every day | RESPIRATORY_TRACT | Status: DC
  Administered 2024-02-13 – 2024-02-15 (×3): 1 via RESPIRATORY_TRACT
  Filled 2024-02-12: qty 7

## 2024-02-12 MED ORDER — SODIUM CHLORIDE 0.9% FLUSH
10.0000 mL | Freq: Once | INTRAVENOUS | Status: AC
  Administered 2024-02-12: 10 mL

## 2024-02-12 MED ORDER — ALBUTEROL SULFATE (2.5 MG/3ML) 0.083% IN NEBU
2.5000 mg | INHALATION_SOLUTION | Freq: Four times a day (QID) | RESPIRATORY_TRACT | Status: DC
  Administered 2024-02-12: 2.5 mg via RESPIRATORY_TRACT
  Filled 2024-02-12: qty 3

## 2024-02-12 MED ORDER — ACETAMINOPHEN 325 MG PO TABS
650.0000 mg | ORAL_TABLET | Freq: Four times a day (QID) | ORAL | Status: DC | PRN
  Administered 2024-02-15: 650 mg via ORAL
  Filled 2024-02-12 (×2): qty 2

## 2024-02-12 MED ORDER — ALBUTEROL SULFATE (2.5 MG/3ML) 0.083% IN NEBU
2.5000 mg | INHALATION_SOLUTION | Freq: Two times a day (BID) | RESPIRATORY_TRACT | Status: DC
  Administered 2024-02-13: 2.5 mg via RESPIRATORY_TRACT
  Filled 2024-02-12: qty 3

## 2024-02-12 MED ORDER — SODIUM CHLORIDE 0.9 % IV SOLN: INTRAVENOUS | Status: AC

## 2024-02-12 NOTE — ED Triage Notes (Signed)
 Pt brought over from cancer center due to COVID positive contact and desatting to 87% on RA. Placed on 3L [place] with rebound to 93%

## 2024-02-12 NOTE — Plan of Care (Signed)
  Problem: Nutrition: Goal: Adequate nutrition will be maintained Outcome: Progressing   Problem: Safety: Goal: Ability to remain free from injury will improve Outcome: Progressing  Problem: Clinical Measurements: Goal: Respiratory complications will improve Outcome: Progressing

## 2024-02-12 NOTE — H&P (Signed)
 History and Physical    Tiffany [name] WUX:[id number] DOB: 04-27-1946 DOA: 02/12/2024  PCP: Joselyn Arrow, MD  Patient coming from: Home  I have personally briefly reviewed patient's old medical records in [facility]  Chief Complaint: Cough and shortness of breath for 2 days  HPI: Tiffany [name] is a 78 y.o. female with PMH significant for adenocarcinoma of left lung, MGUS, GERD, COPD on home oxygen at night only, hyperlipidemia, hypertension,  osteopenia presented in the ED with complaints of shortness of breath associated with cough for 7 days. Patient reports her husband  is admitted  for COVID , he is still in hospital, she has developed similar symptoms for last 7 days which has gotten worse.She reports cough is getting worse, has been using her home oxygen without any significant improvement.  She was supposed to have chemotherapy session today which was canceled.  She has called EMS and was brought in the ED.  ED Course: She was hypoxic with SpO2 of 86% on 2 L of oxygen, requiring 4 L to improve saturation to 94%. Other vitals BP 137/57, temp 94.1, HR 70, RR 19 Labs include sodium 130, potassium 3.6, chloride 94, bicarb 28, glucose 110, BUN 12, creatinine 0.52, calcium 7.9, anion gap 8, alkaline phosphatase 73, albumin 3.0, AST 20, ALT 18, total protein 6.4, total bilirubin 0.7, BNP 80.8, troponin 12, lactic acid 0.8, WBC 3.5, hemoglobin 9.7, hematocrit 30.6, platelet 174, influenza RSV negative, COVID-positive, Chest x-ray Hyperinflation with chronic changes. Stable slight density with a marker in the right midthorax. Chest port.  Review of Systems:    Review of Systems  Constitutional:  Positive for fever and malaise/fatigue.  HENT: Negative.    Eyes: Negative.   Respiratory:  Positive for cough, sputum production and shortness of breath.   Cardiovascular: Negative.   Gastrointestinal: Negative.   Genitourinary: Negative.   Musculoskeletal: Negative.   Skin: Negative.    Neurological: Negative.   Endo/Heme/Allergies: Negative.   Psychiatric/Behavioral: Negative.       Past Medical History:  Diagnosis Date   Abnormal PFT 09/02/2014   moderate restrictive ventilatory defect.   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 Terri Piedra   Colon polyp 12/2003   Diverticulosis 11/2010   seen on colonoscopy and air contrast BE   Emphysema of lung (HCC)    Family history of breast cancer    GERD (gastroesophageal reflux disease)    History of radiation therapy    Right Lung-09/04/23-09/09/23- Dr. Antony Blackbird   Hyperlipidemia    Hypertension    previously treated with meds, resolved   MGUS (monoclonal gammopathy of unknown significance) 1999   prevously under care of Dr. 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   Tobacco abuse    quit in 2019    Past Surgical History:  Procedure Laterality Date   BASAL CELL CARCINOMA EXCISION  11/26/2008   L face, R temple   BILATERAL SALPINGOOPHORECTOMY  11/26/2005   cancerous tumor R ovary  BIOPSY  10/07/2023   Procedure: BIOPSY;  Surgeon: Meridee Score Netty Starring., MD;  Location: [facility] ENDOSCOPY;  Service: Gastroenterology;;   BRONCHIAL BIOPSY  07/09/2023   Procedure: BRONCHIAL BIOPSIES;  Surgeon: Leslye Peer, MD;  Location: [facility] ENDOSCOPY;  Service: Pulmonary;;   BRONCHIAL BRUSHINGS  07/09/2023   Procedure: BRONCHIAL BRUSHINGS;  Surgeon: Leslye Peer, MD;  Location: [facility] ENDOSCOPY;  Service: Pulmonary;;   BRONCHIAL NEEDLE ASPIRATION BIOPSY  07/09/2023   Procedure: BRONCHIAL NEEDLE ASPIRATION BIOPSIES;   Surgeon: Leslye Peer, MD;  Location: [facility] ENDOSCOPY;  Service: Pulmonary;;   CATARACT EXTRACTION  11/27/2007   bilateral   COLONOSCOPY  11/26/2010   Dr. Laural Benes. Diverticulosis. BE also done--normal   ESOPHAGOGASTRODUODENOSCOPY (EGD) WITH PROPOFOL N/A 10/07/2023   Procedure: ESOPHAGOGASTRODUODENOSCOPY (EGD) WITH PROPOFOL;  Surgeon: Meridee Score Netty Starring., MD;  Location: [facility] ENDOSCOPY;  Service: Gastroenterology;  Laterality: N/A;   EUS N/A 10/07/2023   Procedure: UPPER ENDOSCOPIC ULTRASOUND (EUS) RADIAL;  Surgeon: Lemar Lofty., MD;  Location: [facility] ENDOSCOPY;  Service: Gastroenterology;  Laterality: N/A;   FIDUCIAL MARKER PLACEMENT  07/09/2023   Procedure: FIDUCIAL MARKER PLACEMENT;  Surgeon: Leslye Peer, MD;  Location: [facility] ENDOSCOPY;  Service: Pulmonary;;   FINE NEEDLE ASPIRATION N/A 10/07/2023   Procedure: FINE NEEDLE ASPIRATION (FNA) LINEAR;  Surgeon: Lemar Lofty., MD;  Location: [facility] ENDOSCOPY;  Service: Gastroenterology;  Laterality: N/A;   OMENTECTOMY  11/26/2005   and pelvic lymphadenectomy (with BSO)   PORTACATH PLACEMENT N/A 11/22/2023   Procedure: INSERTION PORT-A-CATH;  Surgeon: [name], MD;  Location: [facility] OR;  Service: General;  Laterality: N/A;   ROBOTIC ASSISTED LAPAROSCOPIC LYSIS OF ADHESION N/A 07/18/2023   Procedure: ROBOTIC ASSISTED PELVIC MASS EXCISION WITH RADICAL DISSECTION, MINI LAPAROTOMY, CYSTOSCOPY;  Surgeon: Carver Fila, MD;  Location: [facility] ORS;  Service: Gynecology;  Laterality: N/A;   SKIN CANCER EXCISION Left 07/2021   neck; pt can't recall type of cancer   TONSILLECTOMY  age 58   VAGINAL HYSTERECTOMY  late 20's   "didn't want any more children"     reports that she quit smoking about 5 years ago. Her smoking use included cigarettes. She started smoking about 65 years ago. She has a 119.6 pack-year smoking history. She has been exposed to tobacco smoke. She has never used smokeless tobacco. She reports that she does not currently  use alcohol. She reports that she does not use drugs.  No Known Allergies  Family History  Problem Relation Age of Onset   Stroke Mother    Cerebral aneurysm Mother    Hypertension Father    Suicidality Father    Diabetes Maternal Aunt    Diabetes Maternal Aunt    Breast cancer Maternal Aunt    Diabetes Maternal Aunt    Breast cancer Maternal Aunt    Throat cancer Maternal Uncle    Lung cancer Maternal Uncle    Colon cancer Neg Hx    Ovarian cancer Neg Hx    Endometrial cancer Neg Hx    Pancreatic cancer Neg Hx     Family history reviewed and not pertinent.  Prior to Admission medications   Medication Sig Start Date End Date Taking? Authorizing Provider  acetaminophen (TYLENOL) 500 MG tablet Take 2 tablets (1,000 mg total) by mouth every 8 (eight) hours as needed for mild pain (pain score 1-3). May use Tylenol for next 1-3 days if needed for abdominal pain, but otherwise call office if any issues arise 10/07/23 10/06/24  Mansouraty, Netty Starring., MD  albuterol (VENTOLIN HFA) 108 (90 Base) MCG/ACT inhaler Inhale 2 puffs into the lungs every 4 (four) hours as needed for wheezing or shortness of breath. 01/01/24   Malachy Mood, MD  albuterol (VENTOLIN HFA) 108 (90 Base) MCG/ACT inhaler INHALE 2 PUFFS BY MOUTH EVERY 6 HOURS AS NEEDED FOR WHEEZING FOR SHORTNESS OF BREATH . APPOINTMENT REQUIRED FOR FUTURE REFILLS 02/11/24   Luciano Cutter, MD  cholecalciferol (VITAMIN D3) 25 MCG (1000 UNIT) tablet Take 1,000 Units by mouth daily.    [provider]  Coenzyme Q10 (COQ10) 100 MG CAPS Take 100 mg by mouth daily.    [provider]  lidocaine-prilocaine (EMLA) cream Apply to affected area once 11/12/23   Malachy Mood, MD  losartan (COZAAR) 50 MG tablet Take 1 tablet (50 mg total) by mouth daily. 05/08/23   Joselyn Arrow, MD  magic mouthwash (nystatin, lidocaine, diphenhydrAMINE, alum & mag hydroxide) suspension Take 5 mLs by mouth 4 (four) times daily as needed for mouth pain. 12/11/23    Pollyann Samples, NP  ondansetron (ZOFRAN) 8 MG tablet Take 1 tablet (8 mg total) by mouth every 8 (eight) hours as needed for nausea or vomiting. 11/12/23   Malachy Mood, MD  prochlorperazine (COMPAZINE) 10 MG tablet Take 1 tablet (10 mg total) by mouth every 6 (six) hours as needed for nausea or vomiting. 11/12/23   Malachy Mood, MD  rosuvastatin (CRESTOR) 10 MG tablet Take 1 tablet (10 mg total) by mouth daily. Patient taking differently: Take 10 mg by mouth at bedtime. 05/08/23 11/21/23  Joselyn Arrow, MD  Tiotropium Bromide-Olodaterol (STIOLTO RESPIMAT) 2.5-2.5 MCG/ACT AERS INHALE 2 PUFFS BY MOUTH ONCE DAILY 09/23/23   Luciano Cutter, MD    Physical Exam: Vitals:   02/12/24 1321 02/12/24 1615  BP: (!) 122/54 (!) 136/57  Pulse: 75 70  Resp: 18 19  Temp: 98.7 F (37.1 C)   TempSrc: Oral   SpO2: 99% 97%    Constitutional: Appears comfortable, not in any acute distress, deconditioned. Vitals:   02/12/24 1321 02/12/24 1615  BP: (!) 122/54 (!) 136/57  Pulse: 75 70  Resp: 18 19  Temp: 98.7 F (37.1 C)   TempSrc: Oral   SpO2: 99% 97%   Eyes: PERRL, lids and conjunctivae normal ENMT: Mucous membranes are moist. Posterior pharynx clear of any exudate or lesions.Normal dentition.  Neck: normal, supple, no masses, no thyromegaly Respiratory: Wheezing bilaterally, no crackles,  normal respiratory effort. No accessory muscle use.  Cardiovascular: S1, S2 heard  , regular rate and rhythm, no murmurs / rubs / gallops.  Abdomen: Soft,  no tenderness, no masses palpated. No hepatosplenomegaly. Bowel sounds positive.  Musculoskeletal: No joint deformity upper and lower extremities. Good ROM, no contractures. Normal muscle tone.  Skin: no rashes, lesions, ulcers. No induration Neurologic: CN 2-12 grossly intact. Sensation intact, DTR normal. Strength 5/5 in all 4.  Psychiatric: Normal judgment and insight. Alert and oriented x 3. Normal mood.    Labs on Admission: I have personally reviewed  following labs and imaging studies  CBC: Recent Labs  Lab 02/12/24 1223 02/12/24 1434  WBC 3.6* 3.5*  NEUTROABS 2.4 2.3  HGB 10.5* 9.7*  HCT 32.1* 30.6*  MCV 93.9 98.1  PLT 176 174   Basic Metabolic Panel: Recent Labs  Lab 02/12/24 1223 02/12/24 1434  NA 133* 130*  K 3.9 3.6  CL 95* 94*  CO2 31 28  GLUCOSE 113* 110*  BUN 12 12  CREATININE 0.53 0.52  CALCIUM 8.7*  7.9*   GFR: Estimated Creatinine Clearance: 53.8 mL/min (by C-G formula based on SCr of 0.52 mg/dL). Liver Function Tests: Recent Labs  Lab 02/12/24 1223 02/12/24 1434  AST 19 20  ALT 17 18  ALKPHOS 85 73  BILITOT 0.7 0.7  PROT 7.0 6.4*  ALBUMIN 3.6 3.0*   No results for input(s): "LIPASE", "AMYLASE" in the last 168 hours. No results for input(s): "AMMONIA" in the last 168 hours. Coagulation Profile: No results for input(s): "INR", "PROTIME" in the last 168 hours. Cardiac Enzymes: No results for input(s): "CKTOTAL", "CKMB", "CKMBINDEX", "TROPONINI" in the last 168 hours. BNP (last 3 results) No results for input(s): "PROBNP" in the last 8760 hours. HbA1C: No results for input(s): "HGBA1C" in the last 72 hours. CBG: No results for input(s): "GLUCAP" in the last 168 hours. Lipid Profile: No results for input(s): "CHOL", "HDL", "LDLCALC", "TRIG", "CHOLHDL", "LDLDIRECT" in the last 72 hours. Thyroid Function Tests: No results for input(s): "TSH", "T4TOTAL", "FREET4", "T3FREE", "THYROIDAB" in the last 72 hours. Anemia Panel: No results for input(s): "VITAMINB12", "FOLATE", "FERRITIN", "TIBC", "IRON", "RETICCTPCT" in the last 72 hours. Urine analysis:    Component Value Date/Time   LABSPEC 1.010 09/11/2023 1331   BILIRUBINUR negative 09/11/2023 1331   BILIRUBINUR neg 11/01/2016 1530   KETONESUR negative 09/11/2023 1331   PROTEINUR negative 09/11/2023 1331   PROTEINUR neg 11/01/2016 1530   UROBILINOGEN negative 11/01/2016 1530   NITRITE Negative 09/11/2023 1331   NITRITE neg 11/01/2016 1530    LEUKOCYTESUR Large (3+) (A) 09/11/2023 1331    Radiological Exams on Admission: DG Chest Portable 1 View Result Date: 02/12/2024 CLINICAL DATA:  Chest discomfort, shortness of breath and cough. History of non-small-cell lung cancer. EXAM: PORTABLE CHEST 1 VIEW COMPARISON:  Noncontrast CT 01/10/2024.  X-ray 11/22/2023. FINDINGS: Normal cardiopericardial silhouette. Calcified aorta. Left subclavian chest port with tip along the central SVC. Hyperinflation with diffuse interstitial changes. Apical pleural thickening. There is a marker in the right midthorax with some subtle asymmetric nodular density. Please correlate with prior evaluation. No consolidation or effusion. Overlapping cardiac leads. IMPRESSION: Hyperinflation with chronic changes. Stable slight density with a marker in the right midthorax. Chest port. Electronically Signed   By: Karen Kays M.D.   On: 02/12/2024 16:21    EKG: Independently reviewed.  Sinus rhythm, left ventricular hypertrophy  Assessment/Plan Principal Problem:   Acute hypoxic respiratory failure (HCC) Active Problems:   Pure hypercholesterolemia   COPD, severe (HCC)   MGUS (monoclonal gammopathy of unknown significance)   Essential hypertension   Nocturnal hypoxemia   Adenocarcinoma of upper lobe of right lung (HCC)   Port-A-Cath in place  Acute on chronic hypoxic respiratory failure: COVID pneumonia: Patient presented with worsening cough and shortness of breath. She was hypoxic requiring 4 L of supplemental oxygen. Chest x-ray showed hyperinflation with chronic changes Continue supplemental oxygen wean as tolerated Continue Solu-Medrol 40 mg every 12 hours. Patient has symptoms more than 7 days, no indication for antiviral medication. Continue airborne precautions, monitor inflammatory markers.  COPD: Patient has severe COP,  uses oxygen only at night Continue Solu-Medrol 40 mg every 12 hours. Continue scheduled and as needed nebulized  bronchodilators. Continue guaifenesin,   Hyponatremia: Likely due to dehydration in the setting of COVID-positive. Continue gentle IV hydration. Follow-up a.m. labs  Essential hypertension Continue losartan.  Hyperlipidemia: Continue statin  History of lung cancer; Pancreatic cancer: She was supposed to have chemotherapy session today, canceled. Follow-up outpatient oncology  DVT prophylaxis: Lovenox Code Status: Full code  Family Communication: No family at bed side Disposition Plan:    Status is: Inpatient Remains inpatient appropriate because: Admitted for acute hypoxic respiratory failure secondary to COVID   Consults called: None Admission status:Inpatient   Willeen Niece MD [facility]   If 7PM-7AM, please contact night-coverage   02/12/2024, 6:13 PM

## 2024-02-12 NOTE — Progress Notes (Signed)
 Pt taken to ED for further evaluation d/t SOB and dypsnea.  Report given to Marylene Land, RN in ED.

## 2024-02-12 NOTE — ED Notes (Signed)
 Provided pt [place] sandwich and coke

## 2024-02-12 NOTE — ED Notes (Signed)
 [place] sandwich and coke at bedside for pt

## 2024-02-12 NOTE — ED Provider Notes (Signed)
 [place] [facility] AT [facility] Provider Note   CSN: [id number] Arrival date & time: 02/12/24  1313     History  No chief complaint on file.   Tiffany [name] is a 78 y.o. female.  The history is provided by the patient and medical records. No language interpreter was used.  Shortness of Breath Severity:  Moderate Onset quality:  Gradual Duration:  1 week Timing:  Constant Progression:  Waxing and waning Chronicity:  New Context: URI (cough)   Relieved by:  Nothing Worsened by:  Coughing Ineffective treatments:  None tried Associated symptoms: chest pain, cough and sputum production   Associated symptoms: no abdominal pain, no fever, no headaches, no hemoptysis, no neck pain, no rash, no vomiting and no wheezing   Risk factors: hx of cancer        Home Medications Prior to Admission medications   Medication Sig Start Date End Date Taking? Authorizing Provider  acetaminophen (TYLENOL) 500 MG tablet Take 2 tablets (1,000 mg total) by mouth every 8 (eight) hours as needed for mild pain (pain score 1-3). May use Tylenol for next 1-3 days if needed for abdominal pain, but otherwise call office if any issues arise 10/07/23 10/06/24  Mansouraty, Netty Starring., MD  albuterol (VENTOLIN HFA) 108 (90 Base) MCG/ACT inhaler Inhale 2 puffs into the lungs every 4 (four) hours as needed for wheezing or shortness of breath. 01/01/24   Malachy Mood, MD  albuterol (VENTOLIN HFA) 108 (90 Base) MCG/ACT inhaler INHALE 2 PUFFS BY MOUTH EVERY 6 HOURS AS NEEDED FOR WHEEZING FOR SHORTNESS OF BREATH . APPOINTMENT REQUIRED FOR FUTURE REFILLS 02/11/24   Luciano Cutter, MD  cholecalciferol (VITAMIN D3) 25 MCG (1000 UNIT) tablet Take 1,000 Units by mouth daily.    [provider]  Coenzyme Q10 (COQ10) 100 MG CAPS Take 100 mg by mouth daily.    [provider]  lidocaine-prilocaine (EMLA) cream Apply to affected area once 11/12/23   Malachy Mood, MD  losartan (COZAAR)  50 MG tablet Take 1 tablet (50 mg total) by mouth daily. 05/08/23   Joselyn Arrow, MD  magic mouthwash (nystatin, lidocaine, diphenhydrAMINE, alum & mag hydroxide) suspension Take 5 mLs by mouth 4 (four) times daily as needed for mouth pain. 12/11/23   Pollyann Samples, NP  ondansetron (ZOFRAN) 8 MG tablet Take 1 tablet (8 mg total) by mouth every 8 (eight) hours as needed for nausea or vomiting. 11/12/23   Malachy Mood, MD  prochlorperazine (COMPAZINE) 10 MG tablet Take 1 tablet (10 mg total) by mouth every 6 (six) hours as needed for nausea or vomiting. 11/12/23   Malachy Mood, MD  rosuvastatin (CRESTOR) 10 MG tablet Take 1 tablet (10 mg total) by mouth daily. Patient taking differently: Take 10 mg by mouth at bedtime. 05/08/23 11/21/23  Joselyn Arrow, MD  Tiotropium Bromide-Olodaterol (STIOLTO RESPIMAT) 2.5-2.5 MCG/ACT AERS INHALE 2 PUFFS BY MOUTH ONCE DAILY 09/23/23   Luciano Cutter, MD      Allergies    Patient has no known allergies.    Review of Systems   Review of Systems  Constitutional:  Positive for fatigue. Negative for chills and fever.  HENT:  Positive for congestion.   Eyes:  Negative for visual disturbance.  Respiratory:  Positive for cough, sputum production, chest tightness and shortness of breath. Negative for hemoptysis, wheezing and stridor.   Cardiovascular:  Positive for chest pain. Negative for palpitations and leg swelling.  Gastrointestinal:  Negative for abdominal  pain, constipation, diarrhea, nausea and vomiting.  Genitourinary:  Negative for dysuria and flank pain.  Musculoskeletal:  Negative for back pain, neck pain and neck stiffness.  Skin:  Negative for rash and wound.  Neurological:  Negative for headaches.  Psychiatric/Behavioral:  Negative for agitation and confusion.   All other systems reviewed and are negative.   Physical Exam Updated Vital Signs BP (!) 122/54 (BP Location: Left Arm)   Pulse 75   Temp 98.7 F (37.1 C) (Oral)   Resp 18   SpO2 99%  Physical  Exam Vitals and nursing note reviewed.  Constitutional:      General: She is not in acute distress.    Appearance: She is well-developed. She is not ill-appearing, toxic-appearing or diaphoretic.  HENT:     Head: Normocephalic and atraumatic.     Nose: Congestion present. No rhinorrhea.     Mouth/Throat:     Mouth: Mucous membranes are moist.     Pharynx: No oropharyngeal exudate or posterior oropharyngeal erythema.  Eyes:     Extraocular Movements: Extraocular movements intact.     Conjunctiva/sclera: Conjunctivae normal.     Pupils: Pupils are equal, round, and reactive to light.  Cardiovascular:     Rate and Rhythm: Normal rate and regular rhythm.     Pulses: Normal pulses.     Heart sounds: No murmur heard. Pulmonary:     Effort: Pulmonary effort is normal. No respiratory distress.     Breath sounds: Rhonchi present. No wheezing or rales.  Chest:     Chest wall: No tenderness.  Abdominal:     General: Abdomen is flat.     Palpations: Abdomen is soft.     Tenderness: There is no abdominal tenderness.  Musculoskeletal:        General: No swelling or tenderness.     Cervical back: Neck supple. No tenderness.     Right lower leg: No edema.     Left lower leg: No edema.  Skin:    General: Skin is warm and dry.     Capillary Refill: Capillary refill takes less than 2 seconds.     Findings: No erythema or rash.  Neurological:     General: No focal deficit present.     Mental Status: She is alert.     Sensory: No sensory deficit.     Motor: No weakness.  Psychiatric:        Mood and Affect: Mood normal.     ED Results / Procedures / Treatments   Labs (all labs ordered are listed, but only abnormal results are displayed) Labs Reviewed  RESP PANEL BY RT-PCR (RSV, FLU A&B, COVID)  [id number] - Abnormal; Notable for the following components:      Result Value   SARS Coronavirus 2 by RT PCR POSITIVE (*)    All other components within normal limits  CBC WITH  DIFFERENTIAL/PLATELET - Abnormal; Notable for the following components:   WBC 3.5 (*)    RBC 3.12 (*)    Hemoglobin 9.7 (*)    HCT 30.6 (*)    RDW 15.9 (*)    Lymphs Abs 0.3 (*)    All other components within normal limits  COMPREHENSIVE METABOLIC PANEL - Abnormal; Notable for the following components:   Sodium 130 (*)    Chloride 94 (*)    Glucose, Bld 110 (*)    Calcium 7.9 (*)    Total Protein 6.4 (*)    Albumin 3.0 (*)  All other components within normal limits  BRAIN NATRIURETIC PEPTIDE  LACTIC ACID, PLASMA  LACTIC ACID, PLASMA  TROPONIN I (HIGH SENSITIVITY)  TROPONIN I (HIGH SENSITIVITY)    EKG None  Radiology DG Chest Portable 1 View Result Date: 02/12/2024 CLINICAL DATA:  Chest discomfort, shortness of breath and cough. History of non-small-cell lung cancer. EXAM: PORTABLE CHEST 1 VIEW COMPARISON:  Noncontrast CT 01/10/2024.  X-ray 11/22/2023. FINDINGS: Normal cardiopericardial silhouette. Calcified aorta. Left subclavian chest port with tip along the central SVC. Hyperinflation with diffuse interstitial changes. Apical pleural thickening. There is a marker in the right midthorax with some subtle asymmetric nodular density. Please correlate with prior evaluation. No consolidation or effusion. Overlapping cardiac leads. IMPRESSION: Hyperinflation with chronic changes. Stable slight density with a marker in the right midthorax. Chest port. Electronically Signed   By: Karen Kays M.D.   On: 02/12/2024 16:21    Procedures Procedures    Medications Ordered in ED Medications - No data to display  ED Course/ Medical Decision Making/ A&P                                 Medical Decision Making Amount and/or Complexity of Data Reviewed Labs: ordered. Radiology: ordered.  Risk Decision regarding hospitalization.    Tiffany [name] is a 78 y.o. female with a past medical history significant for COPD with 2 L oxygen at home at nighttime as needed, MGUS,  hypertension, and prior pancreatic mass and lung masses currently undergoing chemotherapy last treatment 2 weeks ago who presents from cancer center for hypoxia, chest discomfort, shortness of breath, and hypoxia.  According to patient, over the last week or 2 she has been having some URI symptoms and shortness of breath.  She reports her husband is currently admitted with COVID-19 at this time.  She reports has had some cough with production but denies hemoptysis.  She reports she has been having some chest discomfort on and off for the last few weeks but is not severe.  Reports shortness of breath.  She reports no significant nausea, vomiting, constipation, diarrhea, or urinary changes.  Denies leg pain or leg swelling.  Denies history of blood clots.  Reports no pleuritic discomfort.  Patient went to her cancer center appointment today and was found to be hypoxic in the mid 80s on room air.  She was placed on 3 L to obtain oxygen saturations 90s.  On my exam, lungs do sound somewhat coarse.  Chest was nontender.  No murmur.  Abdomen nontender.  Back and flanks nontender.  There was some rhonchi but no significant wheezing or rales.  Legs nontender nonedematous.  Patient resting comfortably.  EKG showed no STEMI  Patient will get x-ray and labs to further evaluate.  Given her husband with admission for COVID-19 I suspect this with the patient has.  Given her lack of pleuritic discomfort, tachycardia, or history of DVT/PE have less suspicion for thromboembolic disease and suspect she either has pneumonia versus viral infection primarily.  Patient tells me she would like to try to go home if she can as she does have some oxygen she can use at night that she could use during the daytime.  Will get labs, x-ray, and a cardiac workup given her chest discomfort that she has been having.  5:18 PM Patient now for 4 L to maintain oxygen saturations in the 90s.  Her x-ray did not show pneumonia and  her labs are  otherwise similar to prior however she is COVID-positive as expected.  Given the increased oxygen requirement from what she normally takes at home and her continued chest tightness and shortness of breath, she will need admission for management of COVID-19.  Patient agrees with plan for admission.         Final Clinical Impression(s) / ED Diagnoses Final diagnoses:  COVID-19  Hypoxia  Acute cough  Shortness of breath     Clinical Impression: 1. COVID-19   2. Hypoxia   3. Acute cough   4. Shortness of breath     Disposition: Admit  This note was prepared with assistance of Dragon voice recognition software. Occasional wrong-word or sound-a-like substitutions may have occurred due to the inherent limitations of voice recognition software.     Darianny Momon, Canary Brim, MD 02/12/24 [phone]

## 2024-02-13 ENCOUNTER — Encounter (HOSPITAL_COMMUNITY): Payer: Self-pay | Admitting: Family Medicine

## 2024-02-13 DIAGNOSIS — J9601 Acute respiratory failure with hypoxia: Secondary | ICD-10-CM | POA: Diagnosis not present

## 2024-02-13 LAB — CBC
HCT: 30.9 % — ABNORMAL LOW (ref 36.0–46.0)
Hemoglobin: 9.5 g/dL — ABNORMAL LOW (ref 12.0–15.0)
MCH: 30.7 pg (ref 26.0–34.0)
MCHC: 30.7 g/dL (ref 30.0–36.0)
MCV: 100 fL (ref 80.0–100.0)
Platelets: 192 10*3/uL (ref 150–400)
RBC: 3.09 MIL/uL — ABNORMAL LOW (ref 3.87–5.11)
RDW: 15.7 % — ABNORMAL HIGH (ref 11.5–15.5)
WBC: 1.7 10*3/uL — ABNORMAL LOW (ref 4.0–10.5)
nRBC: 0 % (ref 0.0–0.2)

## 2024-02-13 LAB — COMPREHENSIVE METABOLIC PANEL
ALT: 18 U/L (ref 0–44)
AST: 20 U/L (ref 15–41)
Albumin: 2.9 g/dL — ABNORMAL LOW (ref 3.5–5.0)
Alkaline Phosphatase: 68 U/L (ref 38–126)
Anion gap: 7 (ref 5–15)
BUN: 12 mg/dL (ref 8–23)
CO2: 27 mmol/L (ref 22–32)
Calcium: 8.2 mg/dL — ABNORMAL LOW (ref 8.9–10.3)
Chloride: 98 mmol/L (ref 98–111)
Creatinine, Ser: 0.49 mg/dL (ref 0.44–1.00)
GFR, Estimated: >60 mL/min (ref >60)
Glucose, Bld: 145 mg/dL — ABNORMAL HIGH (ref 70–99)
Potassium: 4.4 mmol/L (ref 3.5–5.1)
Sodium: 132 mmol/L — ABNORMAL LOW (ref 135–145)
Total Bilirubin: 0.5 mg/dL (ref 0.0–1.2)
Total Protein: 6.1 g/dL — ABNORMAL LOW (ref 6.5–8.1)

## 2024-02-13 LAB — MAGNESIUM: Magnesium: 2.2 mg/dL (ref 1.7–2.4)

## 2024-02-13 LAB — PHOSPHORUS: Phosphorus: 3.8 mg/dL (ref 2.5–4.6)

## 2024-02-13 MED ORDER — CHLORHEXIDINE GLUCONATE CLOTH 2 % EX PADS
6.0000 | MEDICATED_PAD | Freq: Every day | CUTANEOUS | Status: DC
  Administered 2024-02-13 – 2024-02-14 (×2): 6 via TOPICAL

## 2024-02-13 MED ORDER — SODIUM CHLORIDE 0.9% FLUSH
10.0000 mL | Freq: Two times a day (BID) | INTRAVENOUS | Status: DC
  Administered 2024-02-13 – 2024-02-15 (×5): 10 mL

## 2024-02-13 MED ORDER — ALBUTEROL SULFATE (2.5 MG/3ML) 0.083% IN NEBU
2.5000 mg | INHALATION_SOLUTION | Freq: Three times a day (TID) | RESPIRATORY_TRACT | Status: DC
  Administered 2024-02-14: 2.5 mg via RESPIRATORY_TRACT
  Filled 2024-02-13: qty 3

## 2024-02-13 MED ORDER — IPRATROPIUM-ALBUTEROL 0.5-2.5 (3) MG/3ML IN SOLN
3.0000 mL | Freq: Four times a day (QID) | RESPIRATORY_TRACT | Status: DC
  Administered 2024-02-13: 3 mL via RESPIRATORY_TRACT
  Filled 2024-02-13: qty 3

## 2024-02-13 MED ORDER — IPRATROPIUM-ALBUTEROL 0.5-2.5 (3) MG/3ML IN SOLN: 3.0000 mL | Freq: Four times a day (QID) | RESPIRATORY_TRACT | Status: DC | PRN

## 2024-02-13 MED ORDER — ALBUTEROL SULFATE (2.5 MG/3ML) 0.083% IN NEBU
2.5000 mg | INHALATION_SOLUTION | RESPIRATORY_TRACT | Status: DC | PRN
Start: 2024-02-13 — End: 2024-02-13

## 2024-02-13 MED ORDER — PREDNISONE 20 MG PO TABS
40.0000 mg | ORAL_TABLET | Freq: Every day | ORAL | Status: DC
  Administered 2024-02-14 – 2024-02-15 (×2): 40 mg via ORAL
  Filled 2024-02-13 (×2): qty 2

## 2024-02-13 MED ORDER — CHLORHEXIDINE GLUCONATE CLOTH 2 % EX PADS: 6.0000 | MEDICATED_PAD | Freq: Every day | CUTANEOUS | Status: DC

## 2024-02-13 NOTE — Progress Notes (Addendum)
 PROGRESS NOTE    Tiffany [name]  WUJ:[id number] DOB: [birth year] DOA: 02/12/2024 PCP: Joselyn Arrow, MD  No chief complaint on file.   Brief Narrative:   78 yo with hx pancreatic cancer, GERD, COPD on 2 L nightly O2 and multiple other medical issues here with SOB and cough x7 days.  Her husband currently admitted with COVID 19 infection.  She's been admitted for COPD exacerbation and COVID 19 infection with hypoxic respiratory failure.   Assessment & Plan:   Principal Problem:   Acute hypoxic respiratory failure (HCC) Active Problems:   Pure hypercholesterolemia   COPD, severe (HCC)   MGUS (monoclonal gammopathy of unknown significance)   Essential hypertension   Nocturnal hypoxemia   Adenocarcinoma of upper lobe of right lung (HCC)   Port-Dylana Shaw-Cath in place  Acute Hypoxic Respiratory Failure Covid 19 Infection  COPD Exacerbation On 1 L [place] today (improved from 4 L 3/19) CXR with hyperinflation with chronic changes, slight density in right mid thorax. Continue steroids, scheduled and prn nebs She's gradually improving, maybe ready to discharge as early as 3/21 if her O2 needs continue to improve Follow CRP 3/21 AM  COVID-19 Labs  No results for input(s): "DDIMER", "FERRITIN", "LDH", "CRP" in the last 72 hours.  Lab Results  Component Value Date   SARSCOV2NAA POSITIVE (Adilenne Ashworth) 02/12/2024   SARSCOV2NAA NEGATIVE 09/14/2020   Pancreatic Cancer with lung nodules Following with Dr. Mosetta Putt outpatient Chemotherapy held 3/19 due to above -> has planned CT scan for April 10  Hypertension Losartan  HLD Statin    DVT prophylaxis: lovenox Code Status: full Family Communication: none Disposition:   Status is: Inpatient Remains inpatient appropriate because: continued need for inpatient care   Consultants:  none  Procedures:  none  Antimicrobials:  Anti-infectives (From admission, onward)    None       Subjective: Breathing feeling better  Objective: Vitals:    02/13/24 0911 02/13/24 1046 02/13/24 1257 02/13/24 1312  BP:   (!) 123/54 119/63  Pulse:   70 66  Resp:    15  Temp:    98.1 F (36.7 C)  TempSrc:    Oral  SpO2: 97% 91% 91% 96%  Height:        Intake/Output Summary (Last 24 hours) at 02/13/2024 1717 Last data filed at 02/13/2024 1545 Gross per 24 hour  Intake 1419 ml  Output --  Net 1419 ml   There were no vitals filed for this visit.  Examination:  General exam: Appears calm and comfortable  Respiratory system: coarse breath sounds Cardiovascular system: RRR Gastrointestinal system: Abdomen is nondistended, soft and nontender.  Central nervous system: Alert and oriented. No focal neurological deficits. Extremities: no LEE    Data Reviewed: I have personally reviewed following labs and imaging studies  CBC: Recent Labs  Lab 02/12/24 1223 02/12/24 1434 02/13/24 0556  WBC 3.6* 3.5* 1.7*  NEUTROABS 2.4 2.3  --   HGB 10.5* 9.7* 9.5*  HCT 32.1* 30.6* 30.9*  MCV 93.9 98.1 100.0  PLT 176 174 192    Basic Metabolic Panel: Recent Labs  Lab 02/12/24 1223 02/12/24 1434 02/13/24 0556  NA 133* 130* 132*  K 3.9 3.6 4.4  CL 95* 94* 98  CO2 31 28 27   GLUCOSE 113* 110* 145*  BUN 12 12 12   CREATININE 0.53 0.52 0.49  CALCIUM 8.7* 7.9* 8.2*  MG  --   --  2.2  PHOS  --   --  3.8    GFR:  Estimated Creatinine Clearance: 53.8 mL/min (by C-G formula based on SCr of 0.49 mg/dL).  Liver Function Tests: Recent Labs  Lab 02/12/24 1223 02/12/24 1434 02/13/24 0556  AST 19 20 20   ALT 17 18 18   ALKPHOS 85 73 68  BILITOT 0.7 0.7 0.5  PROT 7.0 6.4* 6.1*  ALBUMIN 3.6 3.0* 2.9*    CBG: No results for input(s): "GLUCAP" in the last 168 hours.   Recent Results (from the past 240 hours)  Resp panel by RT-PCR (RSV, Flu Kyriana Yankee&B, Covid) Anterior Nasal Swab     Status: Abnormal   Collection Time: 02/12/24  1:58 PM   Specimen: Anterior Nasal Swab  Result Value Ref Range Status   SARS Coronavirus 2 by RT PCR POSITIVE (Catarina Huntley)  NEGATIVE Final    Comment: (NOTE) SARS-CoV-2 target nucleic acids are DETECTED.  The SARS-CoV-2 RNA is generally detectable in upper respiratory specimens during the acute phase of infection. Positive results are indicative of the presence of the identified virus, but do not rule out bacterial infection or co-infection with other pathogens not detected by the test. Clinical correlation with patient history and other diagnostic information is necessary to determine patient infection status. The expected result is Negative.  Fact Sheet for Patients: BloggerCourse.com  Fact Sheet for Healthcare Providers: SeriousBroker.it  This test is not yet approved or cleared by the [place] FDA and  has been authorized for detection and/or diagnosis of SARS-CoV-2 by FDA under an Emergency Use Authorization (EUA).  This EUA will remain in effect (meaning this test can be used) for the duration of  the COVID-19 declaration under Section 564(b)(1) of the [place], 21 U.S.C. section 360bbb-3(b)(1), unless the authorization is terminated or revoked sooner.     Influenza Kana Reimann by PCR NEGATIVE NEGATIVE Final   Influenza B by PCR NEGATIVE NEGATIVE Final    Comment: (NOTE) The Xpert Xpress SARS-CoV-2/FLU/RSV plus assay is intended as an aid in the diagnosis of influenza from Nasopharyngeal swab specimens and should not be used as Santhiago Collingsworth sole basis for treatment. Nasal washings and aspirates are unacceptable for Xpert Xpress SARS-CoV-2/FLU/RSV testing.  Fact Sheet for Patients: BloggerCourse.com  Fact Sheet for Healthcare Providers: SeriousBroker.it  This test is not yet approved or cleared by the [place] FDA and has been authorized for detection and/or diagnosis of SARS-CoV-2 by FDA under an Emergency Use Authorization (EUA). This EUA will remain in effect (meaning this test can be used) for the  duration of the COVID-19 declaration under Section 564(b)(1) of the Act, 21 U.S.C. section 360bbb-3(b)(1), unless the authorization is terminated or revoked.     Resp Syncytial Virus by PCR NEGATIVE NEGATIVE Final    Comment: (NOTE) Fact Sheet for Patients: BloggerCourse.com  Fact Sheet for Healthcare Providers: SeriousBroker.it  This test is not yet approved or cleared by the [place] FDA and has been authorized for detection and/or diagnosis of SARS-CoV-2 by FDA under an Emergency Use Authorization (EUA). This EUA will remain in effect (meaning this test can be used) for the duration of the COVID-19 declaration under Section 564(b)(1) of the Act, 21 U.S.C. section 360bbb-3(b)(1), unless the authorization is terminated or revoked.  Performed at [facility], [address] [place], [place] [address]          Radiology Studies: DG Chest Portable 1 View Result Date: 02/12/2024 CLINICAL DATA:  Chest discomfort, shortness of breath and cough. History of non-small-cell lung cancer. EXAM: PORTABLE CHEST 1 VIEW COMPARISON:  Noncontrast CT 01/10/2024.  X-ray 11/22/2023. FINDINGS: Normal cardiopericardial silhouette. Calcified aorta. Left subclavian chest port with tip along the central SVC. Hyperinflation with diffuse interstitial changes. Apical pleural thickening. There is Viktoriya Glaspy marker in the right midthorax with some subtle asymmetric nodular density. Please correlate with prior evaluation. No consolidation or effusion. Overlapping cardiac leads. IMPRESSION: Hyperinflation with chronic changes. Stable slight density with Micheline Markes marker in the right midthorax. Chest port. Electronically Signed   By: Karen Kays M.D.   On: 02/12/2024 16:21        Scheduled Meds:  albuterol  2.5 mg Nebulization BID   arformoterol  15 mcg Nebulization BID   And   umeclidinium bromide  1 puff Inhalation Daily   Chlorhexidine  Gluconate Cloth  6 each Topical Q2200   docusate sodium  100 mg Oral BID   enoxaparin (LOVENOX) injection  30 mg Subcutaneous Q24H   guaiFENesin  600 mg Oral BID   losartan  50 mg Oral Daily   methylPREDNISolone (SOLU-MEDROL) injection  40 mg Intravenous Q12H   rosuvastatin  10 mg Oral QHS   sodium chloride flush  10-40 mL Intracatheter Q12H   Continuous Infusions:  sodium chloride 40 mL/hr at 02/13/24 1545     LOS: 1 day    Time spent: over 30 min    Lacretia Nicks, MD [facility]   To contact the attending provider between 7A-7P or the covering provider during after hours 7P-7A, please log into the web site [email or web address] and access using universal [place] password for that web site. If you do not have the password, please call the hospital operator.  02/13/2024, 5:17 PM

## 2024-02-13 NOTE — TOC Initial Note (Signed)
 Transition of Care [facility]) - Initial/Assessment Note    Patient Details  Name: Ellawyn Wogan Berg MRN: [id number] Date of Birth: Feb 01, 1946  [facility] CM/SW Contact:    Jessie Foot, RN Phone Number: 02/13/2024, 3:04 PM  Clinical Narrative:                 Patient lives in a single family house with spouse. Patient states she was independent and drives self to appointments. DME (cane, walker, rollator, [place], [employer], WC) and [employer] for nocturnal oxygen. Patient states she will drive herself home at discharge. TOC will follow to progression for oxygen changes.  Expected Discharge Plan: Home/Self Care Barriers to Discharge: Continued Medical Work up   Patient Goals and CMS Choice            Expected Discharge Plan and Services   Discharge Planning Services: CM Consult                                          Prior Living Arrangements/Services   Lives with:: Spouse Patient language and need for interpreter reviewed:: Yes Do you feel safe going back to the place where you live?: Yes      Need for Family Participation in Patient Care: No (Comment) Care giver support system in place?: Yes (comment) Current home services: DME Criminal Activity/Legal Involvement Pertinent to Current Situation/Hospitalization: No - Comment as needed  Activities of Daily Living   ADL Screening (condition at time of admission) Independently performs ADLs?: Yes (appropriate for developmental age) Is the patient deaf or have difficulty hearing?: No Does the patient have difficulty seeing, even when wearing glasses/contacts?: No Does the patient have difficulty concentrating, remembering, or making decisions?: No  Permission Sought/Granted Permission sought to share information with : Case Manager, Magazine features editor Permission granted to share information with : Yes, Verbal Permission Granted     Permission granted to share info w AGENCY: [employer]         Emotional Assessment Appearance:: Appears stated age Attitude/Demeanor/Rapport: Engaged Affect (typically observed): Accepting, Appropriate Orientation: : Oriented to Self, Oriented to Place, Oriented to  Time, Oriented to Situation Alcohol / Substance Use: Not Applicable Psych Involvement: No (comment)  Admission diagnosis:  Shortness of breath [R06.02] Hypoxia [R09.02] Acute cough [R05.1] Acute hypoxic respiratory failure (HCC) [J96.01] COVID-19 [U07.1] Patient Active Problem List   Diagnosis Date Noted   Acute hypoxic respiratory failure (HCC) 02/12/2024   Port-A-Cath in place 12/18/2023   Prediabetes 11/22/2023   Pancreatic cancer (HCC) 10/25/2023   Genetic testing 10/14/2023   Pancreatic mass 10/08/2023   Gastritis and gastroduodenitis 10/08/2023   Family history of breast cancer    Adenocarcinoma of upper lobe of right lung (HCC) 08/06/2023   Pelvic mass in female 07/18/2023   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   Lung mass 07/09/2023   Abnormal positron emission tomography (PET) scan 06/26/2023   Nocturnal hypoxemia 08/15/2022   Chest pain on exertion 01/08/2022   Aortic atherosclerosis (HCC) 06/03/2018   Atherosclerosis of right coronary artery 06/03/2018   Essential hypertension 12/31/2016   MGUS (monoclonal gammopathy of unknown significance) 10/30/2016   Senile purpura (HCC) 08/31/2015   COPD, 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   Osteopenia 05/12/2012   Vitamin D deficiency 04/28/2012   Pure hypercholesterolemia 04/28/2012   Former smoker 04/03/2012   PCP:  Joselyn Arrow, MD Pharmacy:   [facility] [id number] (  [place]), [place] - [address] [place] ([place])  [address] Phone: [phone] Fax: [phone]     Social Drivers of Health (SDOH) Social History: SDOH Screenings   Food Insecurity: No Food Insecurity (02/12/2024)  Housing: Low Risk  (02/12/2024)  Transportation Needs: No Transportation Needs (02/12/2024)  Utilities:  Not At Risk (02/12/2024)  Depression (PHQ2-9): Low Risk  (11/21/2023)  Social Connections: Moderately Isolated (02/12/2024)  Tobacco Use: Medium Risk (02/13/2024)   SDOH Interventions:     Readmission Risk Interventions    07/19/2023   10:21 AM  Readmission Risk Prevention Plan  Post Dischage Appt Complete  Medication Screening Complete  Transportation Screening Complete

## 2024-02-14 ENCOUNTER — Other Ambulatory Visit: Payer: Self-pay

## 2024-02-14 DIAGNOSIS — J449 Chronic obstructive pulmonary disease, unspecified: Secondary | ICD-10-CM

## 2024-02-14 DIAGNOSIS — C3411 Malignant neoplasm of upper lobe, right bronchus or lung: Secondary | ICD-10-CM

## 2024-02-14 DIAGNOSIS — J9601 Acute respiratory failure with hypoxia: Secondary | ICD-10-CM | POA: Diagnosis not present

## 2024-02-14 DIAGNOSIS — I10 Essential (primary) hypertension: Secondary | ICD-10-CM

## 2024-02-14 DIAGNOSIS — D472 Monoclonal gammopathy: Secondary | ICD-10-CM

## 2024-02-14 LAB — CBC WITH DIFFERENTIAL/PLATELET
Abs Immature Granulocytes: 0.01 10*3/uL (ref 0.00–0.07)
Basophils Absolute: 0 10*3/uL (ref 0.0–0.1)
Basophils Relative: 0 %
Eosinophils Absolute: 0 10*3/uL (ref 0.0–0.5)
Eosinophils Relative: 0 %
HCT: 29.4 % — ABNORMAL LOW (ref 36.0–46.0)
Hemoglobin: 9.3 g/dL — ABNORMAL LOW (ref 12.0–15.0)
Immature Granulocytes: 0 %
Lymphocytes Relative: 6 %
Lymphs Abs: 0.3 10*3/uL — ABNORMAL LOW (ref 0.7–4.0)
MCH: 30.9 pg (ref 26.0–34.0)
MCHC: 31.6 g/dL (ref 30.0–36.0)
MCV: 97.7 fL (ref 80.0–100.0)
Monocytes Absolute: 0.4 10*3/uL (ref 0.1–1.0)
Monocytes Relative: 9 %
Neutro Abs: 4 10*3/uL (ref 1.7–7.7)
Neutrophils Relative %: 85 %
Platelets: 254 10*3/uL (ref 150–400)
RBC: 3.01 MIL/uL — ABNORMAL LOW (ref 3.87–5.11)
RDW: 15.7 % — ABNORMAL HIGH (ref 11.5–15.5)
WBC: 4.7 10*3/uL (ref 4.0–10.5)
nRBC: 0 % (ref 0.0–0.2)

## 2024-02-14 LAB — COMPREHENSIVE METABOLIC PANEL
ALT: 19 U/L (ref 0–44)
AST: 18 U/L (ref 15–41)
Albumin: 2.9 g/dL — ABNORMAL LOW (ref 3.5–5.0)
Alkaline Phosphatase: 66 U/L (ref 38–126)
Anion gap: 6 (ref 5–15)
BUN: 16 mg/dL (ref 8–23)
CO2: 27 mmol/L (ref 22–32)
Calcium: 8.2 mg/dL — ABNORMAL LOW (ref 8.9–10.3)
Chloride: 99 mmol/L (ref 98–111)
Creatinine, Ser: 0.5 mg/dL (ref 0.44–1.00)
GFR, Estimated: >60 mL/min (ref >60)
Glucose, Bld: 148 mg/dL — ABNORMAL HIGH (ref 70–99)
Potassium: 4.2 mmol/L (ref 3.5–5.1)
Sodium: 132 mmol/L — ABNORMAL LOW (ref 135–145)
Total Bilirubin: 0.6 mg/dL (ref 0.0–1.2)
Total Protein: 6 g/dL — ABNORMAL LOW (ref 6.5–8.1)

## 2024-02-14 LAB — PHOSPHORUS: Phosphorus: 3 mg/dL (ref 2.5–4.6)

## 2024-02-14 LAB — C-REACTIVE PROTEIN: CRP: 1.5 mg/dL — ABNORMAL HIGH (ref <1.0)

## 2024-02-14 LAB — MAGNESIUM: Magnesium: 2.2 mg/dL (ref 1.7–2.4)

## 2024-02-14 MED ORDER — ALBUTEROL SULFATE (2.5 MG/3ML) 0.083% IN NEBU
2.5000 mg | INHALATION_SOLUTION | Freq: Two times a day (BID) | RESPIRATORY_TRACT | Status: DC
  Administered 2024-02-14 – 2024-02-15 (×2): 2.5 mg via RESPIRATORY_TRACT
  Filled 2024-02-14 (×2): qty 3

## 2024-02-14 MED ORDER — FUROSEMIDE 10 MG/ML IJ SOLN
40.0000 mg | Freq: Once | INTRAMUSCULAR | Status: AC
  Administered 2024-02-14: 40 mg via INTRAVENOUS
  Filled 2024-02-14: qty 4

## 2024-02-14 NOTE — Plan of Care (Signed)
  Problem: Respiratory: Goal: Will maintain a patent airway Outcome: Progressing   Problem: Respiratory: Goal: Complications related to the disease process, condition or treatment will be avoided or minimized Outcome: Progressing   Problem: Nutrition: Goal: Adequate nutrition will be maintained Outcome: Progressing   Problem: Safety: Goal: Ability to remain free from injury will improve Outcome: Progressing

## 2024-02-14 NOTE — Progress Notes (Signed)
 Mobility Specialist - Progress Note   02/14/24 1023  Oxygen Therapy  SpO2 (!) 85 %  O2 Device Nasal Cannula  O2 Flow Rate (L/min) 2 L/min  Patient Activity (if Appropriate) Ambulating  Mobility  Activity Ambulated with assistance in hallway  Level of Assistance Contact guard assist, steadying assist  Assistive Device Cane  Distance Ambulated (ft) 120 ft  Activity Response Tolerated well  Mobility Referral Yes  Mobility visit 1 Mobility  Mobility Specialist Start Time (ACUTE ONLY) 1010  Mobility Specialist Stop Time (ACUTE ONLY) 1022  Mobility Specialist Time Calculation (min) (ACUTE ONLY) 12 min   Pt received in bed and agreeable to mobility. Upon returning to room, pt desat to 85%. Encouraged pursed lip breaths bringing SpO2 to 91%. No complaints during session. Pt to EOB after session with all needs met.    During mobility: 85% SpO2 (2L [place]) Post-mobility: 91% SPO2 (2L [place])  Chief Technology Officer

## 2024-02-14 NOTE — Progress Notes (Signed)
 [facility]  PROGRESS NOTE  Tiffany [name] ZOX:[id number] DOB: 06/25/1946 DOA: 02/12/2024 PCP: Tiffany Arrow, MD   Brief HPI:    78 yo with hx pancreatic cancer, GERD, COPD on 2 L nightly O2 and multiple other medical issues here with SOB and cough x7 days.  Her husband currently admitted with COVID 19 infection.  She's been admitted for COPD exacerbation and COVID 19 infection with hypoxic respiratory failure.      Assessment/Plan:   Acute hypoxemic respiratory failure -COVID-19 infection, COPD exacerbation -Improving, continue steroids -Continue to wean off oxygen as tolerated -She has bibasilar crackles, will give 1 dose of Lasix 40 mg IV -CRP is down to 1.5  Pancreatic cancer with lung nodules -Follows with Tiffany [name] as outpatient -Chemotherapy held on 3/19 due to COVID-19  Hypertension -Continue losartan  Hyperlipidemia -Continue statin  Medications     albuterol  2.5 mg Nebulization BID   arformoterol  15 mcg Nebulization BID   And   umeclidinium bromide  1 puff Inhalation Daily   Chlorhexidine Gluconate Cloth  6 each Topical Q2200   docusate sodium  100 mg Oral BID   enoxaparin (LOVENOX) injection  30 mg Subcutaneous Q24H   guaiFENesin  600 mg Oral BID   losartan  50 mg Oral Daily   predniSONE  40 mg Oral Q breakfast   rosuvastatin  10 mg Oral QHS   sodium chloride flush  10-40 mL Intracatheter Q12H     Data Reviewed:   CBG:  No results for input(s): "GLUCAP" in the last 168 hours.  SpO2: 99 % O2 Flow Rate (L/min): 2 L/min    Vitals:   02/13/24 1925 02/14/24 0506 02/14/24 0750 02/14/24 0755  BP: (!) 153/65 (!) 143/62    Pulse: 73 66    Resp: 15 15    Temp: 97.9 F (36.6 C) 98.1 F (36.7 C)    TempSrc:  Oral    SpO2: 98% 99% 99% 99%  Height:          Data Reviewed:  Basic Metabolic Panel: Recent Labs  Lab 02/12/24 1223 02/12/24 1434 02/13/24 0556 02/14/24 0144  NA 133* 130* 132* 132*  K 3.9 3.6 4.4 4.2  CL 95* 94* 98 99   CO2 31 28 27 27   GLUCOSE 113* 110* 145* 148*  BUN 12 12 12 16   CREATININE 0.53 0.52 0.49 0.50  CALCIUM 8.7* 7.9* 8.2* 8.2*  MG  --   --  2.2 2.2  PHOS  --   --  3.8 3.0    CBC: Recent Labs  Lab 02/12/24 1223 02/12/24 1434 02/13/24 0556 02/14/24 0144  WBC 3.6* 3.5* 1.7* 4.7  NEUTROABS 2.4 2.3  --  4.0  HGB 10.5* 9.7* 9.5* 9.3*  HCT 32.1* 30.6* 30.9* 29.4*  MCV 93.9 98.1 100.0 97.7  PLT 176 174 192 254    LFT Recent Labs  Lab 02/12/24 1223 02/12/24 1434 02/13/24 0556 02/14/24 0144  AST 19 20 20 18   ALT 17 18 18 19   ALKPHOS 85 73 68 66  BILITOT 0.7 0.7 0.5 0.6  PROT 7.0 6.4* 6.1* 6.0*  ALBUMIN 3.6 3.0* 2.9* 2.9*     Antibiotics: Anti-infectives (From admission, onward)    None        DVT prophylaxis: Lovenox  Code Status: Full code  Family Communication: No family at bedside   CONSULTS    Subjective   Denies shortness of breath   Objective    Physical Examination:   General-appears  in no acute distress Heart-S1-S2, regular, no murmur auscultated Lungs-bibasilar crackles Abdomen-soft, nontender, no organomegaly Extremities-no edema in the lower extremities Neuro-alert, oriented x3, no focal deficit noted  Status is: Inpatient:          [name]   [facility] If 7PM-7AM, please contact night-coverage at [email or web address], Office  [phone]   02/14/2024, 9:56 AM  LOS: 2 days

## 2024-02-14 NOTE — Plan of Care (Signed)
  Problem: Education: Goal: Knowledge of risk factors and measures for prevention of condition will improve Outcome: Progressing   Problem: Coping: Goal: Psychosocial and spiritual needs will be supported Outcome: Progressing   Problem: Respiratory: Goal: Complications related to the disease process, condition or treatment will be avoided or minimized Outcome: Progressing   Problem: Education: Goal: Knowledge of General Education information will improve Description: Including pain rating scale, medication(s)/side effects and non-pharmacologic comfort measures Outcome: Progressing   Problem: Health Behavior/Discharge Planning: Goal: Ability to manage health-related needs will improve Outcome: Progressing   Problem: Clinical Measurements: Goal: Ability to maintain clinical measurements within normal limits will improve Outcome: Progressing Goal: Diagnostic test results will improve Outcome: Progressing Goal: Respiratory complications will improve Outcome: Progressing Goal: Cardiovascular complication will be avoided Outcome: Progressing   Problem: Activity: Goal: Risk for activity intolerance will decrease Outcome: Progressing   Problem: Nutrition: Goal: Adequate nutrition will be maintained Outcome: Progressing   Problem: Elimination: Goal: Will not experience complications related to bowel motility Outcome: Progressing Goal: Will not experience complications related to urinary retention Outcome: Progressing   Problem: Pain Managment: Goal: General experience of comfort will improve and/or be controlled Outcome: Progressing   Problem: Safety: Goal: Ability to remain free from injury will improve Outcome: Progressing   Problem: Skin Integrity: Goal: Risk for impaired skin integrity will decrease Outcome: Progressing   Problem: Respiratory: Goal: Will maintain a patent airway Outcome: Not Progressing   Problem: Clinical Measurements: Goal: Will remain free  from infection Outcome: Not Progressing   Problem: Coping: Goal: Level of anxiety will decrease Outcome: Not Progressing

## 2024-02-15 DIAGNOSIS — C3411 Malignant neoplasm of upper lobe, right bronchus or lung: Secondary | ICD-10-CM | POA: Diagnosis not present

## 2024-02-15 DIAGNOSIS — J9601 Acute respiratory failure with hypoxia: Secondary | ICD-10-CM | POA: Diagnosis not present

## 2024-02-15 DIAGNOSIS — J449 Chronic obstructive pulmonary disease, unspecified: Secondary | ICD-10-CM | POA: Diagnosis not present

## 2024-02-15 DIAGNOSIS — I10 Essential (primary) hypertension: Secondary | ICD-10-CM | POA: Diagnosis not present

## 2024-02-15 LAB — COMPREHENSIVE METABOLIC PANEL
ALT: 32 U/L (ref 0–44)
AST: 35 U/L (ref 15–41)
Albumin: 3.1 g/dL — ABNORMAL LOW (ref 3.5–5.0)
Alkaline Phosphatase: 69 U/L (ref 38–126)
Anion gap: 9 (ref 5–15)
BUN: 20 mg/dL (ref 8–23)
CO2: 30 mmol/L (ref 22–32)
Calcium: 8.4 mg/dL — ABNORMAL LOW (ref 8.9–10.3)
Chloride: 95 mmol/L — ABNORMAL LOW (ref 98–111)
Creatinine, Ser: 0.49 mg/dL (ref 0.44–1.00)
GFR, Estimated: >60 mL/min (ref >60)
Glucose, Bld: 116 mg/dL — ABNORMAL HIGH (ref 70–99)
Potassium: 3.6 mmol/L (ref 3.5–5.1)
Sodium: 134 mmol/L — ABNORMAL LOW (ref 135–145)
Total Bilirubin: 0.7 mg/dL (ref 0.0–1.2)
Total Protein: 6.3 g/dL — ABNORMAL LOW (ref 6.5–8.1)

## 2024-02-15 MED ORDER — HEPARIN SOD (PORK) LOCK FLUSH 100 UNIT/ML IV SOLN
500.0000 [IU] | INTRAVENOUS | Status: AC | PRN
  Administered 2024-02-15: 500 [IU]

## 2024-02-15 MED ORDER — PREDNISONE 10 MG PO TABS: ORAL_TABLET | ORAL | 0 refills | Status: DC

## 2024-02-15 NOTE — Discharge Summary (Addendum)
 Physician Discharge Summary   Patient: Tiffany [name] MRN: [id number] DOB: 28-Mar-1946  Admit date:     02/12/2024  Discharge date: 02/15/24  Discharge Physician: [name]   PCP: Joselyn Arrow, MD   Recommendations at discharge:   Patient to go home on 2 L pulmonary of oxygen Follow-up PCP in 1 week  Discharge Diagnoses: Principal Problem:   Acute hypoxic respiratory failure (HCC) Active Problems:   Pure hypercholesterolemia   COPD, severe (HCC)   MGUS (monoclonal gammopathy of unknown significance)   Essential hypertension   Nocturnal hypoxemia   Adenocarcinoma of upper lobe of right lung (HCC)   Port-A-Cath in place  Resolved Problems:   * No resolved hospital problems. *  Hospital Course: 78 yo with hx pancreatic cancer, GERD, COPD on 2 L nightly O2 and multiple other medical issues here with SOB and cough x7 days.  Her husband currently admitted with COVID 19 infection.  She's been admitted for COPD exacerbation and COVID 19 infection with hypoxic respiratory failure.     Assessment and Plan:  Acute hypoxemic respiratory failure -COVID-19 infection, COPD exacerbation -Continue to wean off oxygen as tolerated -Patient to go home with oxygen 2 L/min -She has bibasilar crackles, diuresed with IV Lasix -CRP is down to 1.5 -Will discharge on prednisone taper for 7 more days   Pancreatic cancer with lung nodules -Follows with Dr. Mosetta Putt as outpatient -Chemotherapy held on 3/19 due to COVID-19   Hypertension -Continue losartan   Hyperlipidemia -Continue statin        Consultants:  Procedures performed:  Disposition: Home Diet recommendation:  Discharge Diet Orders (From admission, onward)     Start     Ordered   02/15/24 0000  Diet - low sodium heart healthy        02/15/24 1241           Regular diet DISCHARGE MEDICATION: Allergies as of 02/15/2024   No Known Allergies      Medication List     TAKE these medications    acetaminophen  500 MG tablet Commonly known as: TYLENOL Take 2 tablets (1,000 mg total) by mouth every 8 (eight) hours as needed for mild pain (pain score 1-3). May use Tylenol for next 1-3 days if needed for abdominal pain, but otherwise call office if any issues arise What changed: additional instructions   albuterol 108 (90 Base) MCG/ACT inhaler Commonly known as: VENTOLIN HFA Inhale 2 puffs into the lungs every 4 (four) hours as needed for wheezing or shortness of breath. What changed: Another medication with the same name was changed. Make sure you understand how and when to take each.   albuterol 108 (90 Base) MCG/ACT inhaler Commonly known as: VENTOLIN HFA INHALE 2 PUFFS BY MOUTH EVERY 6 HOURS AS NEEDED FOR WHEEZING FOR SHORTNESS OF BREATH . APPOINTMENT REQUIRED FOR FUTURE REFILLS What changed: See the new instructions.   cholecalciferol 25 MCG (1000 UNIT) tablet Commonly known as: VITAMIN D3 Take 1,000 Units by mouth daily.   CoQ10 100 MG Caps Take 100 mg by mouth at bedtime.   lidocaine-prilocaine cream Commonly known as: EMLA Apply to affected area once What changed:  how much to take how to take this when to take this reasons to take this additional instructions   losartan 50 MG tablet Commonly known as: COZAAR Take 1 tablet (50 mg total) by mouth daily.   magic mouthwash (nystatin, lidocaine, diphenhydrAMINE, alum & mag hydroxide) suspension Take 5 mLs by mouth 4 (  four) times daily as needed for mouth pain.   ondansetron 8 MG tablet Commonly known as: Zofran Take 1 tablet (8 mg total) by mouth every 8 (eight) hours as needed for nausea or vomiting.   predniSONE 10 MG tablet Commonly known as: DELTASONE Prednisone 40 mg po daily x  2 day then Prednisone 30 mg po daily x 2 day then Prednisone 20 mg po daily x 2 day then Prednisone 10 mg daily x 2 day then stop... Start taking on: February 16, 2024   prochlorperazine 10 MG tablet Commonly known as: COMPAZINE Take 1 tablet (10  mg total) by mouth every 6 (six) hours as needed for nausea or vomiting.   rosuvastatin 10 MG tablet Commonly known as: CRESTOR Take 1 tablet (10 mg total) by mouth daily. What changed: when to take this   Stiolto Respimat 2.5-2.5 MCG/ACT Aers Generic drug: Tiotropium Bromide-Olodaterol INHALE 2 PUFFS BY MOUTH ONCE DAILY What changed: See the new instructions.        Discharge Exam: There were no vitals filed for this visit. General-appears in no acute distress Heart-S1-S2, regular, no murmur auscultated Lungs-clear to auscultation bilaterally, no wheezing or crackles auscultated Abdomen-soft, nontender, no organomegaly Extremities-no edema in the lower extremities Neuro-alert, oriented x3, no focal deficit noted  Condition at discharge: good  The results of significant diagnostics from this hospitalization (including imaging, microbiology, ancillary and laboratory) are listed below for reference.   Imaging Studies: DG Chest Portable 1 View Result Date: 02/12/2024 CLINICAL DATA:  Chest discomfort, shortness of breath and cough. History of non-small-cell lung cancer. EXAM: PORTABLE CHEST 1 VIEW COMPARISON:  Noncontrast CT 01/10/2024.  X-ray 11/22/2023. FINDINGS: Normal cardiopericardial silhouette. Calcified aorta. Left subclavian chest port with tip along the central SVC. Hyperinflation with diffuse interstitial changes. Apical pleural thickening. There is a marker in the right midthorax with some subtle asymmetric nodular density. Please correlate with prior evaluation. No consolidation or effusion. Overlapping cardiac leads. IMPRESSION: Hyperinflation with chronic changes. Stable slight density with a marker in the right midthorax. Chest port. Electronically Signed   By: Karen Kays M.D.   On: 02/12/2024 16:21    Microbiology: Results for orders placed or performed during the hospital encounter of 02/12/24  Resp panel by RT-PCR (RSV, Flu A&B, Covid) Anterior Nasal Swab      Status: Abnormal   Collection Time: 02/12/24  1:58 PM   Specimen: Anterior Nasal Swab  Result Value Ref Range Status   SARS Coronavirus 2 by RT PCR POSITIVE (A) NEGATIVE Final    Comment: (NOTE) SARS-CoV-2 target nucleic acids are DETECTED.  The SARS-CoV-2 RNA is generally detectable in upper respiratory specimens during the acute phase of infection. Positive results are indicative of the presence of the identified virus, but do not rule out bacterial infection or co-infection with other pathogens not detected by the test. Clinical correlation with patient history and other diagnostic information is necessary to determine patient infection status. The expected result is Negative.  Fact Sheet for Patients: BloggerCourse.com  Fact Sheet for Healthcare Providers: SeriousBroker.it  This test is not yet approved or cleared by the [place] FDA and  has been authorized for detection and/or diagnosis of SARS-CoV-2 by FDA under an Emergency Use Authorization (EUA).  This EUA will remain in effect (meaning this test can be used) for the duration of  the COVID-19 declaration under Section 564(b)(1) of the A ct, 21 U.S.C. section 360bbb-3(b)(1), unless the authorization is terminated or revoked sooner.  Influenza A by PCR NEGATIVE NEGATIVE Final   Influenza B by PCR NEGATIVE NEGATIVE Final    Comment: (NOTE) The Xpert Xpress SARS-CoV-2/FLU/RSV plus assay is intended as an aid in the diagnosis of influenza from Nasopharyngeal swab specimens and should not be used as a sole basis for treatment. Nasal washings and aspirates are unacceptable for Xpert Xpress SARS-CoV-2/FLU/RSV testing.  Fact Sheet for Patients: BloggerCourse.com  Fact Sheet for Healthcare Providers: SeriousBroker.it  This test is not yet approved or cleared by the [place] FDA and has been authorized for  detection and/or diagnosis of SARS-CoV-2 by FDA under an Emergency Use Authorization (EUA). This EUA will remain in effect (meaning this test can be used) for the duration of the COVID-19 declaration under Section 564(b)(1) of the Act, 21 U.S.C. section 360bbb-3(b)(1), unless the authorization is terminated or revoked.     Resp Syncytial Virus by PCR NEGATIVE NEGATIVE Final    Comment: (NOTE) Fact Sheet for Patients: BloggerCourse.com  Fact Sheet for Healthcare Providers: SeriousBroker.it  This test is not yet approved or cleared by the [place] FDA and has been authorized for detection and/or diagnosis of SARS-CoV-2 by FDA under an Emergency Use Authorization (EUA). This EUA will remain in effect (meaning this test can be used) for the duration of the COVID-19 declaration under Section 564(b)(1) of the Act, 21 U.S.C. section 360bbb-3(b)(1), unless the authorization is terminated or revoked.  Performed at [facility], [address], [place], [place] [address]     Labs: CBC: Recent Labs  Lab 02/12/24 1223 02/12/24 1434 02/13/24 0556 02/14/24 0144  WBC 3.6* 3.5* 1.7* 4.7  NEUTROABS 2.4 2.3  --  4.0  HGB 10.5* 9.7* 9.5* 9.3*  HCT 32.1* 30.6* 30.9* 29.4*  MCV 93.9 98.1 100.0 97.7  PLT 176 174 192 254   Basic Metabolic Panel: Recent Labs  Lab 02/12/24 1223 02/12/24 1434 02/13/24 0556 02/14/24 0144 02/15/24 0220  NA 133* 130* 132* 132* 134*  K 3.9 3.6 4.4 4.2 3.6  CL 95* 94* 98 99 95*  CO2 31 28 27 27 30   GLUCOSE 113* 110* 145* 148* 116*  BUN 12 12 12 16 20   CREATININE 0.53 0.52 0.49 0.50 0.49  CALCIUM 8.7* 7.9* 8.2* 8.2* 8.4*  MG  --   --  2.2 2.2  --   PHOS  --   --  3.8 3.0  --    Liver Function Tests: Recent Labs  Lab 02/12/24 1223 02/12/24 1434 02/13/24 0556 02/14/24 0144 02/15/24 0220  AST 19 20 20 18  35  ALT 17 18 18 19  32  ALKPHOS 85 73 68 66 69  BILITOT 0.7 0.7 0.5  0.6 0.7  PROT 7.0 6.4* 6.1* 6.0* 6.3*  ALBUMIN 3.6 3.0* 2.9* 2.9* 3.1*   CBG: No results for input(s): "GLUCAP" in the last 168 hours.  Discharge time spent: greater than 30 minutes.  Signed: [name], MD [facility] 02/15/2024

## 2024-02-15 NOTE — Progress Notes (Addendum)
 Mobility Specialist - Progress Note   02/15/24 1017  Oxygen Therapy  SpO2 (!) 87 %  O2 Device Nasal Cannula  O2 Flow Rate (L/min) 2 L/min  Patient Activity (if Appropriate) Ambulating  Mobility  Activity Ambulated with assistance in hallway  Level of Assistance Modified independent, requires aide device or extra time  Assistive Device [employer] Ambulated (ft) 230 ft  Activity Response Tolerated well  Mobility Referral Yes  Mobility visit 1 Mobility  Mobility Specialist Start Time (ACUTE ONLY) 1000  Mobility Specialist Stop Time (ACUTE ONLY) 1015  Mobility Specialist Time Calculation (min) (ACUTE ONLY) 15 min   Pt received in bed and agreeable to mobility. During ambulation, pt desat to 87%. Encouraged pursed lip breaths bringing SpO2 to 92%. No complaints during session. Pt to bed after session with all needs met.    Pre-mobility: 91 HR, 95% SpO2 During mobility: 117 HR, 87-92% SpO2 Post-mobility: 101 HR, 95% SPO2  Chief Technology Officer

## 2024-02-15 NOTE — TOC Transition Note (Signed)
 Transition of Care [facility]) - Discharge Note   Patient Details  Name: Tiffany [name] MRN: [id number] Date of Birth: [birth year]  [facility] CM/SW Contact:  Larrie Kass, LCSW Phone Number: 02/15/2024, 11:34 AM   Clinical Narrative:     CSW received a consult regarding the pt's O2 sats, which have changed to continues.  Pt will need a new walking sats note and new orders reflecting this change. CSW has contacted [employer] [facility] to arrange for portable to be brought to the room prior to discharge. Pt will drive herself home. No further TOC needs,TOC sign-off.  Final next level of care: Home/Self Care Barriers to Discharge: Barriers Resolved   Patient Goals and CMS Choice Patient states their goals for this hospitalization and ongoing recovery are:: retrun home          Discharge Placement                       Discharge Plan and Services Additional resources added to the After Visit Summary for     Discharge Planning Services: CM Consult            DME Arranged: Oxygen DME Agency: [employer]                  Social Drivers of Health (SDOH) Interventions SDOH Screenings   Food Insecurity: No Food Insecurity (02/12/2024)  Housing: Low Risk  (02/12/2024)  Transportation Needs: No Transportation Needs (02/12/2024)  Utilities: Not At Risk (02/12/2024)  Depression (PHQ2-9): Low Risk  (11/21/2023)  Social Connections: Moderately Isolated (02/12/2024)  Tobacco Use: Medium Risk (02/13/2024)     Readmission Risk Interventions    07/19/2023   10:21 AM  Readmission Risk Prevention Plan  Post Dischage Appt Complete  Medication Screening Complete  Transportation Screening Complete

## 2024-02-15 NOTE — Plan of Care (Signed)

## 2024-02-15 NOTE — Progress Notes (Addendum)
 SATURATION QUALIFICATIONS: (This note is used to comply with regulatory documentation for home oxygen)  Patient Saturations on Room Air at Rest = 90%  Patient Saturations on Room Air while Ambulating = 85%  Patient Saturations on 2 Liters of oxygen while Ambulating = 92%  Please briefly explain why patient needs home oxygen: patient gets very SOB after ambulating 50 feet.

## 2024-02-17 ENCOUNTER — Ambulatory Visit: Payer: Medicare Other | Admitting: Nurse Practitioner

## 2024-02-17 ENCOUNTER — Telehealth: Payer: Self-pay | Admitting: *Deleted

## 2024-02-17 ENCOUNTER — Ambulatory Visit: Payer: Medicare Other

## 2024-02-17 ENCOUNTER — Other Ambulatory Visit: Payer: Medicare Other

## 2024-02-17 NOTE — Transitions of Care (Post Inpatient/ED Visit) (Signed)
   02/17/2024  Name: Tiffany [name] MRN: [id number] DOB: 1946/02/16  Today's TOC FU Call Status: Today's TOC FU Call Status:: Unsuccessful Call (1st Attempt) Unsuccessful Call (1st Attempt) Date: 02/17/24  Attempted to reach the patient regarding the most recent Inpatient/ED visit.  Follow Up Plan: Additional outreach attempts will be made to reach the patient to complete the Transitions of Care (Post Inpatient/ED visit) call.   Gean Maidens BSN RN [facility] Care Management Coordinator [email or web address] Direct Dial: [phone]  Fax: [phone] Website: [email or web address]

## 2024-02-17 NOTE — Transitions of Care (Post Inpatient/ED Visit) (Signed)
 02/17/2024  Name: Tiffany [name] MRN: [id number] DOB: 07-Oct-1946  Today's TOC FU Call Status: Today's TOC FU Call Status:: Successful TOC FU Call Completed TOC FU Call Complete Date: 02/17/24 Patient's Name and Date of Birth confirmed.  Transition Care Management Follow-up Telephone Call Date of Discharge: 02/15/24 Discharge Facility: [facility]) Type of Discharge: Inpatient Admission Primary Inpatient Discharge Diagnosis:: COPD exacerbation and COVID 19 infection with hypoxic respiratory failure How have you been since you were released from the hospital?: Better Any questions or concerns?: No  Items Reviewed: Did you receive and understand the discharge instructions provided?: Yes Medications obtained,verified, and reconciled?: Yes (Medications Reviewed) Any new allergies since your discharge?: No Dietary orders reviewed?: No Do you have support at home?: Yes People in Home: spouse Name of Support/Comfort Primary Source: Christeen Douglas  Medications Reviewed Today: Medications Reviewed Today     Reviewed by Luella Cook, RN (Case Manager) on 02/17/24 at 1332  Med List Status: <None>   Medication Order Taking? Sig Documenting Provider Last Dose Status Informant  acetaminophen (TYLENOL) 500 MG tablet [id number] Yes Take 2 tablets (1,000 mg total) by mouth every 8 (eight) hours as needed for mild pain (pain score 1-3). May use Tylenol for next 1-3 days if needed for abdominal pain, but otherwise call office if any issues arise  Patient taking differently: Take 1,000 mg by mouth every 8 (eight) hours as needed for mild pain (pain score 1-3).   Mansouraty, Netty Starring., MD Taking Active Self           Med Note Antony Madura, Arn Medal   Wed Feb 12, 2024  7:21 PM)    albuterol (VENTOLIN HFA) 108 (90 Base) MCG/ACT inhaler [id number] Yes Inhale 2 puffs into the lungs every 4 (four) hours as needed for wheezing or shortness of breath. Malachy Mood, MD Taking Active Self  albuterol (VENTOLIN HFA)  108 (90 Base) MCG/ACT inhaler [id number] Yes INHALE 2 PUFFS BY MOUTH EVERY 6 HOURS AS NEEDED FOR WHEEZING FOR SHORTNESS OF BREATH . APPOINTMENT REQUIRED FOR FUTURE REFILLS  Patient taking differently: Inhale 2 puffs into the lungs every 6 (six) hours as needed for wheezing or shortness of breath.   Luciano Cutter, MD Taking Active Self  cholecalciferol (VITAMIN D3) 25 MCG (1000 UNIT) tablet [id number] Yes Take 1,000 Units by mouth daily. [provider] Taking Active Self           Med Note Antony Madura, Arn Medal   Wed Feb 12, 2024  7:16 PM)    Coenzyme Q10 (COQ10) 100 MG CAPS [id number] Yes Take 100 mg by mouth at bedtime. [provider] Taking Active Self           Med Note Antony Madura, Arn Medal   Wed Feb 12, 2024  7:16 PM)    lidocaine-prilocaine (EMLA) cream [id number] Yes Apply to affected area once  Patient taking differently: Apply 1 Application topically as needed (for port access).   Malachy Mood, MD Taking Active Self           Med Note Sondra Barges, HELEN   Wed Dec 04, 2023  9:58 AM)    losartan (COZAAR) 50 MG tablet [id number] Yes Take 1 tablet (50 mg total) by mouth daily. Joselyn Arrow, MD Taking Active Self           Med Note Antony Madura, Arn Medal   Wed Feb 12, 2024  7:16 PM)    magic mouthwash (nystatin, lidocaine, diphenhydrAMINE, alum & mag hydroxide) suspension [id number] Yes  Take 5 mLs by mouth 4 (four) times daily as needed for mouth pain. Pollyann Samples, NP Taking Active Self  ondansetron [facility]) 8 MG tablet [id number] Yes Take 1 tablet (8 mg total) by mouth every 8 (eight) hours as needed for nausea or vomiting. Malachy Mood, MD Taking Active Self  predniSONE (DELTASONE) 10 MG tablet [id number] Yes Prednisone 40 mg po daily x  2 day then Prednisone 30 mg po daily x 2 day then Prednisone 20 mg po daily x 2 day then Prednisone 10 mg daily x 2 day then stop... [name], MD Taking Active   prochlorperazine (COMPAZINE) 10 MG tablet [id number] Yes Take 1 tablet (10 mg total) by mouth every 6  (six) hours as needed for nausea or vomiting. Malachy Mood, MD Taking Active Self  rosuvastatin (CRESTOR) 10 MG tablet [id number]  Take 1 tablet (10 mg total) by mouth daily.  Patient taking differently: Take 10 mg by mouth at bedtime.   Joselyn Arrow, MD  Expired 02/12/24 2359 Self           Med Note (BREEDING, HELEN   Wed Dec 04, 2023  9:59 AM)    Tiotropium Bromide-Olodaterol (STIOLTO RESPIMAT) 2.5-2.5 MCG/ACT AERS [id number] Yes INHALE 2 PUFFS BY MOUTH ONCE DAILY  Patient taking differently: Inhale 2 puffs into the lungs in the morning.   Luciano Cutter, MD Taking Active Self            Home Care and Equipment/Supplies: Were Home Health Services Ordered?: NA Any new equipment or medical supplies ordered?: Yes Name of Medical supply agency?: [employer] Were you able to get the equipment/medical supplies?: Yes Do you have any questions related to the use of the equipment/supplies?: No  Functional Questionnaire: Do you need assistance with bathing/showering or dressing?: No Do you need assistance with meal preparation?: No Do you need assistance with eating?: No Do you have difficulty maintaining continence: No Do you need assistance with getting out of bed/getting out of a chair/moving?: No Do you have difficulty managing or taking your medications?: No  Follow up appointments reviewed: PCP Follow-up appointment confirmed?: No MD Provider Line Number:[phone] Given: Yes (Patient stated she will call and make appt.) Specialist Hospital Follow-up appointment confirmed?: Yes Date of Specialist follow-up appointment?: 02/26/24 Follow-Up Specialty Provider:: Oncology Do you need transportation to your follow-up appointment?: No Do you understand care options if your condition(s) worsen?: Yes-patient verbalized understanding  SDOH Interventions Today    Flowsheet Row Most Recent Value  SDOH Interventions   Food Insecurity Interventions Intervention Not Indicated  Housing  Interventions Intervention Not Indicated  Transportation Interventions Intervention Not Indicated  Utilities Interventions Intervention Not Indicated      Interventions Today    Flowsheet Row Most Recent Value  Chronic Disease   Chronic disease during today's visit Chronic Obstructive Pulmonary Disease (COPD)  [COPD exacerbation and COVID 19 infection with hypoxic respiratory failure]  General Interventions   General Interventions Discussed/Reviewed General Interventions Discussed, General Interventions Reviewed, Doctor Visits  [Patient declined [facility] services]  Doctor Visits Discussed/Reviewed Doctor Visits Discussed, PCP, Specialist, Doctor Visits Reviewed  PCP/Specialist Visits Compliance with follow-up visit  Education Interventions   Education Provided Provided Education  Provided Verbal Education On Nutrition, Insurance Plans  [foods that help boost immune system]  Nutrition Interventions   Nutrition Discussed/Reviewed Nutrition Discussed, Nutrition Reviewed  Pharmacy Interventions   Pharmacy Dicussed/Reviewed Pharmacy Topics Discussed, Pharmacy Topics Reviewed       Gean Maidens BSN RN [facility]  Health Care Management Coordinator [email or web address] Direct Dial: [phone]  Fax: [phone] Website: [email or web address]

## 2024-02-25 NOTE — Assessment & Plan Note (Signed)
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  -PET scan from June 10, 2023 showed a hypermetabolic 3.1 cm mass in the right upper lobe, and hypermetabolic soft tissue fullness in the pancreatic head, and partially solid and cystic 12.4 cm pelvic mass. 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 Pricilla Holm  -baseline CA19.9 33 -Pt has seen by pancreatobiliary surgeon Dr. Donell Beers, who recommends neoadjuvant chemotherapy due to the uncertainty of lung metastasis.  Patient declined surgery. -Repeated CT on November 01, 2023 showed improved treated lung mass, but several new or enlarging lung nodules, concerning for metastatic disease.  -Patient is agreeable with chemo, she started gemcitabine and Abraxane on 12/04/2023

## 2024-02-26 ENCOUNTER — Inpatient Hospital Stay: Payer: Medicare Other | Attending: Gynecologic Oncology

## 2024-02-26 ENCOUNTER — Inpatient Hospital Stay: Payer: Medicare Other

## 2024-02-26 ENCOUNTER — Inpatient Hospital Stay: Payer: Medicare Other | Attending: Gynecologic Oncology | Admitting: Hematology

## 2024-02-26 VITALS — BP 137/65 | HR 86 | Temp 98.5°F | Resp 18 | Wt 120.0 lb

## 2024-02-26 DIAGNOSIS — Z803 Family history of malignant neoplasm of breast: Secondary | ICD-10-CM | POA: Diagnosis not present

## 2024-02-26 DIAGNOSIS — R112 Nausea with vomiting, unspecified: Secondary | ICD-10-CM | POA: Diagnosis not present

## 2024-02-26 DIAGNOSIS — I700 Atherosclerosis of aorta: Secondary | ICD-10-CM | POA: Insufficient documentation

## 2024-02-26 DIAGNOSIS — Z90722 Acquired absence of ovaries, bilateral: Secondary | ICD-10-CM | POA: Diagnosis not present

## 2024-02-26 DIAGNOSIS — Z5111 Encounter for antineoplastic chemotherapy: Secondary | ICD-10-CM | POA: Diagnosis not present

## 2024-02-26 DIAGNOSIS — C250 Malignant neoplasm of head of pancreas: Secondary | ICD-10-CM

## 2024-02-26 DIAGNOSIS — Z860100 Personal history of colon polyps, unspecified: Secondary | ICD-10-CM | POA: Diagnosis not present

## 2024-02-26 DIAGNOSIS — R109 Unspecified abdominal pain: Secondary | ICD-10-CM | POA: Diagnosis not present

## 2024-02-26 DIAGNOSIS — Z90710 Acquired absence of both cervix and uterus: Secondary | ICD-10-CM | POA: Insufficient documentation

## 2024-02-26 DIAGNOSIS — Z833 Family history of diabetes mellitus: Secondary | ICD-10-CM | POA: Insufficient documentation

## 2024-02-26 DIAGNOSIS — M8580 Other specified disorders of bone density and structure, unspecified site: Secondary | ICD-10-CM | POA: Insufficient documentation

## 2024-02-26 DIAGNOSIS — Z79633 Long term (current) use of mitotic inhibitor: Secondary | ICD-10-CM | POA: Insufficient documentation

## 2024-02-26 DIAGNOSIS — R0902 Hypoxemia: Secondary | ICD-10-CM | POA: Diagnosis not present

## 2024-02-26 DIAGNOSIS — Z85828 Personal history of other malignant neoplasm of skin: Secondary | ICD-10-CM | POA: Insufficient documentation

## 2024-02-26 DIAGNOSIS — Z79631 Long term (current) use of antimetabolite agent: Secondary | ICD-10-CM | POA: Insufficient documentation

## 2024-02-26 DIAGNOSIS — K5730 Diverticulosis of large intestine without perforation or abscess without bleeding: Secondary | ICD-10-CM | POA: Insufficient documentation

## 2024-02-26 DIAGNOSIS — U071 COVID-19: Secondary | ICD-10-CM | POA: Insufficient documentation

## 2024-02-26 DIAGNOSIS — I10 Essential (primary) hypertension: Secondary | ICD-10-CM | POA: Diagnosis not present

## 2024-02-26 DIAGNOSIS — K5900 Constipation, unspecified: Secondary | ICD-10-CM | POA: Diagnosis not present

## 2024-02-26 DIAGNOSIS — Z823 Family history of stroke: Secondary | ICD-10-CM | POA: Insufficient documentation

## 2024-02-26 DIAGNOSIS — K862 Cyst of pancreas: Secondary | ICD-10-CM | POA: Insufficient documentation

## 2024-02-26 DIAGNOSIS — C3411 Malignant neoplasm of upper lobe, right bronchus or lung: Secondary | ICD-10-CM | POA: Diagnosis not present

## 2024-02-26 DIAGNOSIS — F17290 Nicotine dependence, other tobacco product, uncomplicated: Secondary | ICD-10-CM | POA: Insufficient documentation

## 2024-02-26 DIAGNOSIS — Z818 Family history of other mental and behavioral disorders: Secondary | ICD-10-CM | POA: Insufficient documentation

## 2024-02-26 DIAGNOSIS — Z79899 Other long term (current) drug therapy: Secondary | ICD-10-CM | POA: Insufficient documentation

## 2024-02-26 DIAGNOSIS — Z8249 Family history of ischemic heart disease and other diseases of the circulatory system: Secondary | ICD-10-CM | POA: Insufficient documentation

## 2024-02-26 DIAGNOSIS — C562 Malignant neoplasm of left ovary: Secondary | ICD-10-CM | POA: Diagnosis not present

## 2024-02-26 DIAGNOSIS — Z801 Family history of malignant neoplasm of trachea, bronchus and lung: Secondary | ICD-10-CM | POA: Insufficient documentation

## 2024-02-26 DIAGNOSIS — Z95828 Presence of other vascular implants and grafts: Secondary | ICD-10-CM

## 2024-02-26 LAB — CBC WITH DIFFERENTIAL (CANCER CENTER ONLY)
Abs Immature Granulocytes: 0.03 10*3/uL (ref 0.00–0.07)
Basophils Absolute: 0 10*3/uL (ref 0.0–0.1)
Basophils Relative: 0 %
Eosinophils Absolute: 0 10*3/uL (ref 0.0–0.5)
Eosinophils Relative: 1 %
HCT: 38.9 % (ref 36.0–46.0)
Hemoglobin: 12.5 g/dL (ref 12.0–15.0)
Immature Granulocytes: 0 %
Lymphocytes Relative: 4 %
Lymphs Abs: 0.3 10*3/uL — ABNORMAL LOW (ref 0.7–4.0)
MCH: 30.6 pg (ref 26.0–34.0)
MCHC: 32.1 g/dL (ref 30.0–36.0)
MCV: 95.1 fL (ref 80.0–100.0)
Monocytes Absolute: 1 10*3/uL (ref 0.1–1.0)
Monocytes Relative: 12 %
Neutro Abs: 6.7 10*3/uL (ref 1.7–7.7)
Neutrophils Relative %: 83 %
Platelet Count: 175 10*3/uL (ref 150–400)
RBC: 4.09 MIL/uL (ref 3.87–5.11)
RDW: 15.4 % (ref 11.5–15.5)
WBC Count: 8.1 10*3/uL (ref 4.0–10.5)
nRBC: 0 % (ref 0.0–0.2)

## 2024-02-26 LAB — CMP (CANCER CENTER ONLY)
ALT: 18 U/L (ref 0–44)
AST: 19 U/L (ref 15–41)
Albumin: 3.8 g/dL (ref 3.5–5.0)
Alkaline Phosphatase: 85 U/L (ref 38–126)
Anion gap: 9 (ref 5–15)
BUN: 18 mg/dL (ref 8–23)
CO2: 29 mmol/L (ref 22–32)
Calcium: 9.2 mg/dL (ref 8.9–10.3)
Chloride: 95 mmol/L — ABNORMAL LOW (ref 98–111)
Creatinine: 0.67 mg/dL (ref 0.44–1.00)
GFR, Estimated: >60 mL/min (ref >60)
Glucose, Bld: 119 mg/dL — ABNORMAL HIGH (ref 70–99)
Potassium: 3.9 mmol/L (ref 3.5–5.1)
Sodium: 133 mmol/L — ABNORMAL LOW (ref 135–145)
Total Bilirubin: 1.1 mg/dL (ref 0.0–1.2)
Total Protein: 7.2 g/dL (ref 6.5–8.1)

## 2024-02-26 MED ORDER — SODIUM CHLORIDE 0.9% FLUSH
10.0000 mL | Freq: Once | INTRAVENOUS | Status: AC
  Administered 2024-02-26: 10 mL

## 2024-02-26 MED ORDER — PACLITAXEL PROTEIN-BOUND CHEMO INJECTION 100 MG
100.0000 mg/m2 | Freq: Once | INTRAVENOUS | Status: AC
  Administered 2024-02-26: 175 mg via INTRAVENOUS
  Filled 2024-02-26: qty 35

## 2024-02-26 MED ORDER — PROCHLORPERAZINE MALEATE 10 MG PO TABS
10.0000 mg | ORAL_TABLET | Freq: Once | ORAL | Status: AC
Start: 2024-02-26 — End: 2024-02-26
  Administered 2024-02-26: 10 mg via ORAL
  Filled 2024-02-26: qty 1

## 2024-02-26 MED ORDER — HEPARIN SOD (PORK) LOCK FLUSH 100 UNIT/ML IV SOLN
500.0000 [IU] | Freq: Once | INTRAVENOUS | Status: AC | PRN
  Administered 2024-02-26: 500 [IU]

## 2024-02-26 MED ORDER — SODIUM CHLORIDE 0.9% FLUSH
10.0000 mL | INTRAVENOUS | Status: DC | PRN
  Administered 2024-02-26: 10 mL

## 2024-02-26 MED ORDER — SODIUM CHLORIDE 0.9 % IV SOLN: INTRAVENOUS | Status: DC

## 2024-02-26 MED ORDER — SODIUM CHLORIDE 0.9 % IV SOLN
800.0000 mg/m2 | Freq: Once | INTRAVENOUS | Status: AC
  Administered 2024-02-26: 1369 mg via INTRAVENOUS
  Filled 2024-02-26: qty 36.01

## 2024-02-26 NOTE — Patient Instructions (Signed)
 [facility]  Discharge Instructions: Thank you for choosing [facility] to provide your oncology and hematology care.   If you have a lab appointment with the Cancer Center, please go directly to the Cancer Center and check in at the registration area.   Wear comfortable clothing and clothing appropriate for easy access to any Portacath or PICC line.   We strive to give you quality time with your provider. You may need to reschedule your appointment if you arrive late (15 or more minutes).  Arriving late affects you and other patients whose appointments are after yours.  Also, if you miss three or more appointments without notifying the office, you may be dismissed from the clinic at the provider's discretion.      For prescription refill requests, have your pharmacy contact our office and allow 72 hours for refills to be completed.    Today you received the following chemotherapy and/or immunotherapy agents: Abraxane, Gemcitabine.       To help prevent nausea and vomiting after your treatment, we encourage you to take your nausea medication as directed.  BELOW ARE SYMPTOMS THAT SHOULD BE REPORTED IMMEDIATELY: *FEVER GREATER THAN 100.4 F (38 C) OR HIGHER *CHILLS OR SWEATING *NAUSEA AND VOMITING THAT IS NOT CONTROLLED WITH YOUR NAUSEA MEDICATION *UNUSUAL SHORTNESS OF BREATH *UNUSUAL BRUISING OR BLEEDING *URINARY PROBLEMS (pain or burning when urinating, or frequent urination) *BOWEL PROBLEMS (unusual diarrhea, constipation, pain near the anus) TENDERNESS IN MOUTH AND THROAT WITH OR WITHOUT PRESENCE OF ULCERS (sore throat, sores in mouth, or a toothache) UNUSUAL RASH, SWELLING OR PAIN  UNUSUAL VAGINAL DISCHARGE OR ITCHING   Items with * indicate a potential emergency and should be followed up as soon as possible or go to the Emergency Department if any problems should occur.  Please show the CHEMOTHERAPY ALERT CARD or  IMMUNOTHERAPY ALERT CARD at check-in to the Emergency Department and triage nurse.  Should you have questions after your visit or need to cancel or reschedule your appointment, please contact [facility] - [facility] DE[facility] OF [facility]  Dept: [phone]  and follow the prompts.  Office hours are 8:00 a.m. to 4:30 p.m. Monday - Friday. Please note that voicemails left after 4:00 p.m. may not be returned until the following business day.  We are closed weekends and major holidays. You have access to a nurse at all times for urgent questions. Please call the main number to the clinic Dept: [phone] and follow the prompts.   For any non-urgent questions, you may also contact your provider using MyChart. We now offer e-Visits for anyone 62 and older to request care online for non-urgent symptoms. For details visit [email or web address].   Also download the MyChart app! Go to the app store, search "MyChart", open the app, select [place], and log in with your MyChart username and password.

## 2024-02-26 NOTE — Progress Notes (Signed)
 [facility]   Telephone:(336) [phone] Fax:(336) [phone]   Clinic Follow up Note   Patient Care Team: Joselyn Arrow, MD as PCP - General (Family Medicine) Luciano Cutter, MD as Consulting Physician (Pulmonary Disease) Antonietta Barcelona, OD as Referring Physician (Optometry) Clyda Hurdle (Physician Assistant) Malachy Mood, MD as Consulting Physician (Hematology and Oncology) Carlean Jews, NP as Nurse Practitioner (Hematology and Oncology)  Date of Service:  02/26/2024  CHIEF COMPLAINT: f/u of pancreatic cancer  CURRENT THERAPY:  Chemotherapy gemcitabine and Abraxane every 2 weeks  Oncology History   Pancreatic cancer (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  -PET scan from June 10, 2023 showed a hypermetabolic 3.1 cm mass in the right upper lobe, and hypermetabolic soft tissue fullness in the pancreatic head, and partially solid and cystic 12.4 cm pelvic mass. 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 Pricilla Holm  -baseline CA19.9 33 -Pt has seen by pancreatobiliary surgeon Dr. Donell Beers, who recommends neoadjuvant chemotherapy due to the uncertainty of lung metastasis.  Patient declined surgery. -Repeated CT on November 01, 2023 showed improved treated lung mass, but several new or enlarging lung nodules, concerning for metastatic disease.  -Patient is agreeable with chemo, she started gemcitabine and Abraxane on 12/04/2023   Assessment and Plan    Pancreatic cancer Undergoing chemotherapy for pancreatic cancer, currently on her third cycle of Ativan. The treatment aims to control the disease and prolong life but is not curative. Reports fatigue and dyspnea, potentially related to chemotherapy and exacerbated by a previous COVID-19  infection. Not considering surgery due to age and lack of home support, despite understanding surgery is the only potential cure with a low success rate. Without surgery, the cancer remains incurable, and even with surgery, metastatic disease is common. Chemotherapy is controlling the disease and prolonging life but is unlikely to cure the cancer. -Patient is not interested in therapy. - Reduce chemotherapy dose due to fatigue and dyspnea. - Schedule next chemotherapy session in two weeks. -Restaging scan scheduled for April 10. -We discussed option of complication radiation, and maintenance immunotherapy  Reason the COVID infection and hypoxia Utilizes oxygen therapy primarily at night and reports difficulty with current equipment due to its weight. Requires a more portable oxygen concentrator. - Instruct her to contact the oxygen supply company to inquire about a more portable oxygen concentrator. - Provide a doctor's order for a portable oxygen concentrator if required by the supply company.  Plan -Lab reviewed, adequate for treatment, will proceed cycle 3-day 15 chemotherapy today with dose reduction due to her overall decline the house -Follow-up in 2 weeks before next cycle chemo   -Restaging CT scan scheduled for April 10     SUMMARY OF ONCOLOGIC HISTORY: Oncology History  Adenocarcinoma of upper lobe of right lung (HCC)  08/06/2023 Initial Diagnosis   Adenocarcinoma of upper lobe of right lung (HCC)   08/06/2023 Cancer Staging   Staging form: Lung, AJCC 8th Edition - Clinical: Stage IB (cT2a, cN0, cM0) - Signed by Si Gaul, MD on 08/06/2023   10/14/2023 Genetic Testing   Negative genetic testing on the CancerNext-Expanded+RNAinsight.  The report date is 10/13/2023.  The CancerNext-Expanded gene panel offered by [employer] and includes sequencing and rearrangement analysis for the following 71 genes: AIP, ALK,  APC, ATM, BAP1, BARD1, BMPR1A, BRCA1, BRCA2, BRIP1, CDC73,  CDH1, CDK4, CDKN1B, CDKN2A, CHEK2, DICER1, FH, FLCN, KIF1B, LZTR1, MAX, MEN1, MET, MLH1, MSH2, MSH6, MUTYH, NF1, NF2, NTHL1, PALB2, PHOX2B, PMS2, POT1, PRKAR1A, PTCH1, PTEN, RAD51C, RAD51D, RB1, RET, SDHA, SDHAF2, SDHB, SDHC, SDHD, SMAD4, SMARCA4, SMARCB1, SMARCE1, STK11, SUFU, TMEM127, TP53, TSC1, TSC2 and VHL (sequencing and deletion/duplication); AXIN2, CTNNA1, EGFR, EGLN1, HOXB13, KIT, MITF, MSH3, PDGFRA, POLD1 and POLE (sequencing only); EPCAM and GREM1 (deletion/duplication only). RNA data is routinely analyzed for use in variant interpretation for all genes.    Pancreatic cancer (HCC)  10/07/2023 Cancer Staging   Staging form: Exocrine Pancreas, AJCC 8th Edition - Clinical stage from 10/07/2023: Stage IB (cT2, cN0, cM0) - Signed by Malachy Mood, MD on 10/25/2023 Total positive nodes: 0   10/25/2023 Initial Diagnosis   Pancreatic cancer (HCC)   12/04/2023 -  Chemotherapy   [name] : PANCREATIC Abraxane D1,8,15 + Gemcitabine D1,8,15 q28d        Discussed the use of AI scribe software for clinical note transcription with the patient, who gave verbal consent to proceed.  History of Present Illness   The patient, with a history of pancreatic cancer, is currently on Ativan chemotherapy and uses oxygen at night. She reports shortness of breath, which she attributes to the chemotherapy. She is able to perform daily activities such as cooking and housecleaning, albeit at a slower pace. She has no pain or other concerns. The patient also discusses her oxygen equipment, which she finds too heavy and difficult to manage. She is scheduled for a scan in the near future and the possibility of surgery has been discussed, but she has declined this option due to her age and her husband's health condition.         All other systems were reviewed with the patient and are negative.  MEDICAL HISTORY:  Past Medical History:  Diagnosis Date   Abnormal PFT 09/02/2014   moderate  restrictive ventilatory defect.   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 Terri Piedra   Colon polyp 12/2003   Diverticulosis 11/2010   seen on colonoscopy and air contrast BE   Emphysema of lung (HCC)    Family history of breast cancer    GERD (gastroesophageal reflux disease)    History of radiation therapy    Right Lung-09/04/23-09/09/23- Dr. Antony Blackbird   Hyperlipidemia    Hypertension    previously treated with meds, resolved   MGUS (monoclonal gammopathy of unknown significance) 1999   prevously under care of Dr. 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   Tobacco abuse    quit in 2019    SURGICAL HISTORY: Past Surgical History:  Procedure Laterality Date   BASAL CELL CARCINOMA EXCISION  11/26/2008   L face, R temple   BILATERAL SALPINGOOPHORECTOMY  11/26/2005   cancerous tumor R ovary   BIOPSY  10/07/2023   Procedure: BIOPSY;  Surgeon: Lemar Lofty., MD;  Location: [facility] ENDOSCOPY;  Service: Gastroenterology;;  BRONCHIAL BIOPSY  07/09/2023   Procedure: BRONCHIAL BIOPSIES;  Surgeon: Leslye Peer, MD;  Location: [facility] ENDOSCOPY;  Service: Pulmonary;;   BRONCHIAL BRUSHINGS  07/09/2023   Procedure: BRONCHIAL BRUSHINGS;  Surgeon: Leslye Peer, MD;  Location: [facility] ENDOSCOPY;  Service: Pulmonary;;   BRONCHIAL NEEDLE ASPIRATION BIOPSY  07/09/2023   Procedure: BRONCHIAL NEEDLE ASPIRATION BIOPSIES;  Surgeon: Leslye Peer, MD;  Location: [facility] ENDOSCOPY;  Service: Pulmonary;;   CATARACT EXTRACTION  11/27/2007   bilateral   COLONOSCOPY  11/26/2010   Dr.  Laural Benes. Diverticulosis. BE also done--normal   ESOPHAGOGASTRODUODENOSCOPY (EGD) WITH PROPOFOL N/A 10/07/2023   Procedure: ESOPHAGOGASTRODUODENOSCOPY (EGD) WITH PROPOFOL;  Surgeon: Meridee Score Netty Starring., MD;  Location: [facility] ENDOSCOPY;  Service: Gastroenterology;  Laterality: N/A;   EUS N/A 10/07/2023   Procedure: UPPER ENDOSCOPIC ULTRASOUND (EUS) RADIAL;  Surgeon: Lemar Lofty., MD;  Location: [facility] ENDOSCOPY;  Service: Gastroenterology;  Laterality: N/A;   FIDUCIAL MARKER PLACEMENT  07/09/2023   Procedure: FIDUCIAL MARKER PLACEMENT;  Surgeon: Leslye Peer, MD;  Location: [facility] ENDOSCOPY;  Service: Pulmonary;;   FINE NEEDLE ASPIRATION N/A 10/07/2023   Procedure: FINE NEEDLE ASPIRATION (FNA) LINEAR;  Surgeon: Lemar Lofty., MD;  Location: [facility] ENDOSCOPY;  Service: Gastroenterology;  Laterality: N/A;   OMENTECTOMY  11/26/2005   and pelvic lymphadenectomy (with BSO)   PORTACATH PLACEMENT N/A 11/22/2023   Procedure: INSERTION PORT-A-CATH;  Surgeon: [name], MD;  Location: [facility] OR;  Service: General;  Laterality: N/A;   ROBOTIC ASSISTED LAPAROSCOPIC LYSIS OF ADHESION N/A 07/18/2023   Procedure: ROBOTIC ASSISTED PELVIC MASS EXCISION WITH RADICAL DISSECTION, MINI LAPAROTOMY, CYSTOSCOPY;  Surgeon: Carver Fila, MD;  Location: [facility] ORS;  Service: Gynecology;  Laterality: N/A;   SKIN CANCER EXCISION Left 07/2021   neck; pt can't recall type of cancer   TONSILLECTOMY  age 89   VAGINAL HYSTERECTOMY  late 20's   "didn't want any more children"    I have reviewed the social history and family history with the patient and they are unchanged from previous note.  ALLERGIES:  has no known allergies.  MEDICATIONS:  Current Outpatient Medications  Medication Sig Dispense Refill   acetaminophen (TYLENOL) 500 MG tablet Take 2 tablets (1,000 mg total) by mouth every 8 (eight) hours as needed for mild pain (pain score 1-3). May use Tylenol for next 1-3 days if needed for abdominal pain, but  otherwise call office if any issues arise (Patient taking differently: Take 1,000 mg by mouth every 8 (eight) hours as needed for mild pain (pain score 1-3).)     albuterol (VENTOLIN HFA) 108 (90 Base) MCG/ACT inhaler Inhale 2 puffs into the lungs every 4 (four) hours as needed for wheezing or shortness of breath. 6.7 g 0   albuterol (VENTOLIN HFA) 108 (90 Base) MCG/ACT inhaler INHALE 2 PUFFS BY MOUTH EVERY 6 HOURS AS NEEDED FOR WHEEZING FOR SHORTNESS OF BREATH . APPOINTMENT REQUIRED FOR FUTURE REFILLS (Patient taking differently: Inhale 2 puffs into the lungs every 6 (six) hours as needed for wheezing or shortness of breath.) 9 g 3   cholecalciferol (VITAMIN D3) 25 MCG (1000 UNIT) tablet Take 1,000 Units by mouth daily.     Coenzyme Q10 (COQ10) 100 MG CAPS Take 100 mg by mouth at bedtime.     lidocaine-prilocaine (EMLA) cream Apply to affected area once (Patient taking differently: Apply 1 Application topically as needed (for port access).) 30 g 3   losartan (COZAAR) 50 MG tablet Take 1 tablet (50 mg total) by mouth  daily. 90 tablet 3   magic mouthwash (nystatin, lidocaine, diphenhydrAMINE, alum & mag hydroxide) suspension Take 5 mLs by mouth 4 (four) times daily as needed for mouth pain. 400 mL 0   ondansetron (ZOFRAN) 8 MG tablet Take 1 tablet (8 mg total) by mouth every 8 (eight) hours as needed for nausea or vomiting. 30 tablet 1   predniSONE (DELTASONE) 10 MG tablet Prednisone 40 mg po daily x  2 day then Prednisone 30 mg po daily x 2 day then Prednisone 20 mg po daily x 2 day then Prednisone 10 mg daily x 2 day then stop... 20 tablet 0   prochlorperazine (COMPAZINE) 10 MG tablet Take 1 tablet (10 mg total) by mouth every 6 (six) hours as needed for nausea or vomiting. 30 tablet 1   rosuvastatin (CRESTOR) 10 MG tablet Take 1 tablet (10 mg total) by mouth daily. (Patient taking differently: Take 10 mg by mouth at bedtime.) 90 tablet 3   Tiotropium Bromide-Olodaterol (STIOLTO RESPIMAT) 2.5-2.5  MCG/ACT AERS INHALE 2 PUFFS BY MOUTH ONCE DAILY (Patient taking differently: Inhale 2 puffs into the lungs in the morning.) 4 g 11   No current facility-administered medications for this visit.    PHYSICAL EXAMINATION: ECOG PERFORMANCE STATUS: 2 - Symptomatic, <50% confined to bed  There were no vitals filed for this visit. Wt Readings from Last 3 Encounters:  02/26/24 120 lb (54.4 kg)  02/12/24 127 lb 8 oz (57.8 kg)  01/31/24 130 lb 6.4 oz (59.1 kg)     GENERAL:alert, no distress and comfortable SKIN: skin color, texture, turgor are normal, no rashes or significant lesions EYES: normal, Conjunctiva are pink and non-injected, sclera clear NECK: supple, thyroid normal size, non-tender, without nodularity LYMPH:  no palpable lymphadenopathy in the cervical, axillary  LUNGS: clear to auscultation and percussion with normal breathing effort HEART: regular rate & rhythm and no murmurs and no lower extremity edema ABDOMEN:abdomen soft, non-tender and normal bowel sounds Musculoskeletal:no cyanosis of digits and no clubbing  NEURO: alert & oriented x 3 with fluent speech, no focal motor/sensory deficits       LABORATORY DATA:  I have reviewed the data as listed    Latest Ref Rng & Units 02/26/2024    1:59 PM 02/14/2024    1:44 AM 02/13/2024    5:56 AM  CBC  WBC 4.0 - 10.5 K/uL 8.1  4.7  1.7   Hemoglobin 12.0 - 15.0 g/dL 04.5  9.3  9.5   Hematocrit 36.0 - 46.0 % 38.9  29.4  30.9   Platelets 150 - 400 K/uL 175  254  192         Latest Ref Rng & Units 02/26/2024    1:59 PM 02/15/2024    2:20 AM 02/14/2024    1:44 AM  CMP  Glucose 70 - 99 mg/dL 409  811  914   BUN 8 - 23 mg/dL 18  20  16    Creatinine 0.44 - 1.00 mg/dL 7.82  9.56  2.13   Sodium 135 - 145 mmol/L 133  134  132   Potassium 3.5 - 5.1 mmol/L 3.9  3.6  4.2   Chloride 98 - 111 mmol/L 95  95  99   CO2 22 - 32 mmol/L 29  30  27    Calcium 8.9 - 10.3 mg/dL 9.2  8.4  8.2   Total Protein 6.5 - 8.1 g/dL 7.2  6.3  6.0   Total  Bilirubin 0.0 - 1.2 mg/dL 1.1  0.7  0.6   Alkaline Phos 38 - 126 U/L 85  69  66   AST 15 - 41 U/L 19  35  18   ALT 0 - 44 U/L 18  32  19       RADIOGRAPHIC STUDIES: I have personally reviewed the radiological images as listed and agreed with the findings in the report. No results found.    No orders of the defined types were placed in this encounter.  All questions were answered. The patient knows to call the clinic with any problems, questions or concerns. No barriers to learning was detected. The total time spent in the appointment was 30 minutes.     Malachy Mood, MD 02/26/2024

## 2024-02-28 ENCOUNTER — Other Ambulatory Visit: Payer: Self-pay

## 2024-02-28 ENCOUNTER — Telehealth: Payer: Self-pay

## 2024-02-28 DIAGNOSIS — C250 Malignant neoplasm of head of pancreas: Secondary | ICD-10-CM

## 2024-02-28 NOTE — Telephone Encounter (Signed)
 Received telephone call from the patient requesting portable concentrator. Patient states the oxygen tank that she has at home is too heavy for her to carry.  Spoke with Dr. Mosetta Putt.  Spoke with [employer]. Was advised to fax Rx or Dr's order along with visit note. Faxed order for portable concentrator to [employer] F# [phone], per Dr. Mosetta Putt. Confirmation Received. Patient notified. Patient awaiting update from [employer].

## 2024-03-02 ENCOUNTER — Other Ambulatory Visit: Payer: Medicare Other

## 2024-03-02 ENCOUNTER — Ambulatory Visit: Payer: Medicare Other

## 2024-03-02 ENCOUNTER — Ambulatory Visit: Payer: Medicare Other | Admitting: Hematology

## 2024-03-05 ENCOUNTER — Ambulatory Visit (HOSPITAL_COMMUNITY)
Admission: RE | Admit: 2024-03-05 | Discharge: 2024-03-05 | Disposition: A | Discharge status: Home / Self Care | Source: Ambulatory Visit | Attending: Nurse Practitioner | Admitting: Nurse Practitioner

## 2024-03-05 DIAGNOSIS — C250 Malignant neoplasm of head of pancreas: Secondary | ICD-10-CM | POA: Diagnosis not present

## 2024-03-05 DIAGNOSIS — C259 Malignant neoplasm of pancreas, unspecified: Secondary | ICD-10-CM | POA: Diagnosis not present

## 2024-03-05 DIAGNOSIS — J439 Emphysema, unspecified: Secondary | ICD-10-CM | POA: Diagnosis not present

## 2024-03-05 MED ORDER — HEPARIN SOD (PORK) LOCK FLUSH 100 UNIT/ML IV SOLN
INTRAVENOUS | Status: AC
  Filled 2024-03-05: qty 5

## 2024-03-05 MED ORDER — IOHEXOL 300 MG/ML  SOLN
100.0000 mL | Freq: Once | INTRAMUSCULAR | Status: AC | PRN
  Administered 2024-03-05: 100 mL via INTRAVENOUS

## 2024-03-05 MED ORDER — SODIUM CHLORIDE (PF) 0.9 % IJ SOLN
INTRAMUSCULAR | Status: AC
  Filled 2024-03-05: qty 50

## 2024-03-10 ENCOUNTER — Other Ambulatory Visit: Payer: Self-pay | Admitting: Nurse Practitioner

## 2024-03-10 ENCOUNTER — Other Ambulatory Visit: Payer: Self-pay

## 2024-03-10 DIAGNOSIS — C250 Malignant neoplasm of head of pancreas: Secondary | ICD-10-CM

## 2024-03-10 NOTE — Assessment & Plan Note (Addendum)
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  -PET scan from June 10, 2023 showed a hypermetabolic 3.1 cm mass in the right upper lobe, and hypermetabolic soft tissue fullness in the pancreatic head, and partially solid and cystic 12.4 cm pelvic mass. 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 Orvil Bland  -baseline CA19.9 33 -Pt has seen by pancreatobiliary surgeon Dr. Cherlynn Cornfield, who recommends neoadjuvant chemotherapy due to the uncertainty of lung metastasis.  Patient declined surgery. -Repeated CT on November 01, 2023 showed improved treated lung mass, but several new or enlarging lung nodules, concerning for metastatic disease.  -Patient is agreeable with chemo, she started gemcitabine  and Abraxane  on 12/04/2023 -Restaging CT CAP done 03/05/2024.  There is a slight diminished size of the hypodense mass in the pancreatic head which is consistent with treatment response.  There is a subsolid lung lesion in the peripheral right upper lobe near fiducial marker, essentially indistinguishable from background.  There is new subpleural consolidation consistent with developing radiation fibrosis.  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  There is also interval enlargement of the cystic lesion with some peripheral soft tissue in the left lower quadrant of the abdomen which is uncertain nature.  03/11/2024 -proceed with cycle 4 day 1 gemcitabine  and Abraxane .  Will continue with treatment every 2 weeks as scheduled.  Patient will see pulmonologist, Dr. Eloise Hake, for further discussion regarding pulmonary  nodules.

## 2024-03-10 NOTE — Progress Notes (Addendum)
 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  -PET scan from June 10, 2023 showed a hypermetabolic 3.1 cm mass in the right upper lobe, and hypermetabolic soft tissue fullness in the pancreatic head, and partially solid and cystic 12.4 cm pelvic mass. 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 Orvil Bland  -baseline CA19.9 33 -Pt has seen by pancreatobiliary surgeon Dr. Cherlynn Cornfield, who recommends neoadjuvant chemotherapy due to the uncertainty of lung metastasis.  Patient declined surgery. -Repeated CT on November 01, 2023 showed improved treated lung mass, but several new or enlarging lung nodules, concerning for metastatic disease.  -Patient is agreeable with chemo, she started gemcitabine  and Abraxane  on 12/04/2023 -Restaging CT CAP done 03/05/2024.  There is a slight diminished size of the hypodense mass in the pancreatic head which is consistent with treatment response.  There is a subsolid lung lesion in the peripheral right upper lobe near fiducial marker, essentially indistinguishable from background.  There is new subpleural consolidation consistent with developing radiation fibrosis.  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  There is also interval enlargement of the cystic lesion with some peripheral soft tissue in the left lower quadrant of the abdomen which is uncertain nature.  03/11/2024 -proceed with cycle 4 day 1 gemcitabine  and Abraxane .  Will continue with treatment every 2 weeks as scheduled.  Patient will see pulmonologist, Dr. Eloise Hake, for further discussion regarding pulmonary nodules.   Plan Patient was seen along with Dr. Maryalice Smaller today. Reviewed labs.  - Stable and mild anemia with Hgb 11.5 and HCT 34.8.  Other labs are unremarkable. Reviewed recent CT CAP -This showed decreased size of pancreatic mass with new lung lesion in the right upper lung lobe with multiple, subsolid and solid nodular opacities in the right lung.  Several nodules have increased in size. Today, we will proceed with cycle 4 day 1 of gemcitabine  and Abraxane . She will see Dr. Eloise Hake, pulmonology, to discuss pulmonary nodules. Labs/flush, follow-up, and cycle 5 day 1 of gemcitabine  and Abraxane  as scheduled.  The patient understands the plans discussed today and is in agreement with them.  She knows to contact our office if she develops concerns prior to her next appointment.  I provided 25 minutes of face-to-face time during this encounter and > 50% was spent counseling as documented under my assessment and plan.    Sharyon Deis, NP  [facility] - A DEPT OF [facility] [address] [place] [address] Dept: [phone] Dept Fax: [phone]   No orders of the defined types were placed in this encounter.     CHIEF COMPLAINT:  CC:  Pancreatic cancer  Current Treatment: Chemotherapy gemcitabine  and Abraxane   INTERVAL HISTORY:  Kimoni is here today for repeat clinical assessment.  She was last seen 02/26/2024 per  Dr. Maryalice Smaller.  Restaging CT CAP done 03/05/2024.  There is a slight diminished size of the hypodense mass in the pancreatic head which is consistent with treatment response.  There is a subsolid lung lesion in the peripheral right upper lobe near fiducial marker, essentially indistinguishable from background.  There is new subpleural consolidation consistent with developing radiation fibrosis.  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  There is also interval enlargement of the cystic lesion with some peripheral soft tissue in the left lower quadrant of the abdomen which is uncertain nature.  Patient does report increase in shortness of breath, especially with exertion.  Currently on oxygen  of 2 L/min.  Has to turn it to her triggers for minutes after exertion.  She reports the increase in shortness of breath was happening even prior to her recent COVID infection. She denies chest pain or chest pressure. She denies headaches or visual disturbances. She continues to  have abdominal pain and nausea with vomiting. She denies fevers or chills. She denies pain. Her appetite comes and goes. Her weight has decreased 3 pounds over last 2 weeks .  I have reviewed the past medical history, past surgical history, social history and family history with the patient and they are unchanged from previous note.  ALLERGIES:  has no known allergies.  MEDICATIONS:  Current Outpatient Medications  Medication Sig Dispense Refill   acetaminophen  (TYLENOL ) 500 MG tablet Take 2 tablets (1,000 mg total) by mouth every 8 (eight) hours as needed for mild pain (pain score 1-3). May use Tylenol  for next 1-3 days if needed for abdominal pain, but otherwise call office if any issues arise (Patient taking differently: Take 1,000 mg by  mouth every 8 (eight) hours as needed for mild pain (pain score 1-3).)     albuterol  (VENTOLIN  HFA) 108 (90 Base) MCG/ACT inhaler Inhale 2 puffs into the lungs every 4 (four) hours as needed for wheezing or shortness of breath. 6.7 g 0   albuterol  (VENTOLIN  HFA) 108 (90 Base) MCG/ACT inhaler INHALE 2 PUFFS BY MOUTH EVERY 6 HOURS AS NEEDED FOR WHEEZING FOR SHORTNESS OF BREATH . APPOINTMENT REQUIRED FOR FUTURE REFILLS (Patient taking differently: Inhale 2 puffs into the lungs every 6 (six) hours as needed for wheezing or shortness of breath.) 9 g 3   cholecalciferol (VITAMIN D3) 25 MCG (1000 UNIT) tablet Take 1,000 Units by mouth daily.     Coenzyme Q10 (COQ10) 100 MG CAPS Take 100 mg by mouth at bedtime.     lidocaine -prilocaine  (EMLA ) cream Apply to affected area once (Patient taking differently: Apply 1 Application topically as needed (for port access).) 30 g 3   losartan  (COZAAR ) 50 MG tablet Take 1 tablet (50 mg total) by mouth daily. 90 tablet 3   magic mouthwash (nystatin , lidocaine , diphenhydrAMINE, alum & mag hydroxide) suspension Take 5 mLs by mouth 4 (four) times daily as needed for mouth pain. 400 mL 0   ondansetron  (ZOFRAN ) 8 MG tablet Take 1 tablet (8 mg total) by mouth every 8 (eight) hours as needed for nausea or vomiting. 30 tablet 1   predniSONE  (DELTASONE ) 10 MG tablet Prednisone  40 mg po daily x  2 day then Prednisone  30 mg po daily x 2 day then Prednisone  20 mg po daily x 2 day then Prednisone  10 mg daily x 2 day then stop... 20 tablet 0   prochlorperazine  (COMPAZINE ) 10 MG tablet Take 1 tablet (10 mg total) by mouth every 6 (six) hours as needed for nausea or vomiting. 30 tablet 1   Tiotropium Bromide -Olodaterol (STIOLTO RESPIMAT ) 2.5-2.5 MCG/ACT AERS INHALE 2 PUFFS BY MOUTH ONCE DAILY (Patient taking differently: Inhale 2 puffs into the lungs in the morning.) 4 g 11   rosuvastatin  (CRESTOR ) 10 MG tablet Take 1 tablet (10 mg total) by mouth daily. (Patient taking differently: Take 10  mg by mouth at bedtime.) 90 tablet 3   No current facility-administered medications for this visit.    HISTORY OF PRESENT ILLNESS:   Oncology History  Adenocarcinoma of upper lobe of right lung (HCC)  08/06/2023 Initial Diagnosis   Adenocarcinoma of upper lobe of right lung (HCC)   08/06/2023 Cancer Staging   Staging form: Lung, AJCC 8th Edition - Clinical: Stage IB (cT2a, cN0, cM0) - Signed by Marlene Simas, MD on 08/06/2023   10/14/2023 Genetic Testing   Negative genetic testing on the CancerNext-Expanded+RNAinsight.  The report date is 10/13/2023.  The CancerNext-Expanded gene panel offered by [employer] and includes sequencing and rearrangement analysis for the following 71 genes: AIP, ALK, APC, ATM, BAP1, BARD1, BMPR1A, BRCA1, BRCA2, BRIP1, CDC73, CDH1, CDK4, CDKN1B, CDKN2A, CHEK2, DICER1, FH, FLCN, KIF1B, LZTR1, MAX, MEN1, MET, MLH1, MSH2, MSH6, MUTYH, NF1, NF2, NTHL1, PALB2, PHOX2B, PMS2, POT1, PRKAR1A, PTCH1, PTEN, RAD51C, RAD51D, RB1, RET, SDHA, SDHAF2, SDHB, SDHC, SDHD, SMAD4, SMARCA4, SMARCB1, SMARCE1, STK11, SUFU, TMEM127, TP53, TSC1, TSC2 and VHL (sequencing and deletion/duplication); AXIN2, CTNNA1, EGFR, EGLN1, HOXB13, KIT, MITF, MSH3, PDGFRA, POLD1 and POLE (sequencing only); EPCAM and GREM1 (deletion/duplication only). RNA data is routinely analyzed for use in variant interpretation for all genes.    Pancreatic cancer (HCC)  10/07/2023 Cancer Staging   Staging form: Exocrine Pancreas, AJCC 8th Edition - Clinical stage from 10/07/2023: Stage IB (cT2, cN0, cM0) - Signed by Sonja Tustin, MD on 10/25/2023 Total positive nodes: 0   10/25/2023 Initial Diagnosis   Pancreatic cancer (HCC)   12/04/2023 -  Chemotherapy   [name] : PANCREATIC Abraxane  D1,8,15 + Gemcitabine  D1,8,15 q28d     03/05/2024 Imaging   CT CAP with contrast IMPRESSION: 1. Slightly diminished size of a hypodense mass of the pancreatic head, consistent with treatment response. No  significant change in diffuse pancreatic ductal dilatation and atrophy of the remaining pancreatic parenchyma. 2. Subsolid lung lesion of the peripheral right upper lobe near a fiducial marker is essentially indistinguishable from background on today's examination with new subpleural consolidation consistent with developing radiation fibrosis. Findings consistent with treatment response of the lesion to local radiation therapy. 3. Multiple additional subsolid and predominantly solid nodular opacities, predominantly in the periphery of the right lung, several of which are increased in size and solid character, appearance and behavior most consistent with worsened multifocal pulmonary adenocarcinoma although pulmonary metastases not generally excluded in the setting of pancreatic adenocarcinoma. 4. Interval enlargement of a cystic lesion with some peripheral soft tissue in the left lower quadrant, of uncertain nature. This is possibly an unusual cystic metastasis or possibly of ovarian origin if there has been ovarian transposition. 5. Severe emphysema.         REVIEW OF SYSTEMS:   Constitutional: Denies fevers, chills or abnormal weight loss.  Moderate fatigue  Eyes: Denies blurriness of vision Ears, nose, mouth, throat, and face: Denies mucositis or sore throat Respiratory: Denies cough.  She does have moderate shortness of breath, especially with exertion. Cardiovascular: Denies palpitation, chest discomfort or lower extremity swelling Gastrointestinal: She reports persistent abdominal pain.  Has nausea with vomiting. Skin: Denies abnormal skin rashes Lymphatics: Denies new lymphadenopathy or easy bruising Neurological:Denies numbness, tingling or new weaknesses Behavioral/Psych: Mood is stable, no new changes  All other systems were reviewed with the patient and are negative.   VITALS:   Today's Vitals   03/11/24 1147 03/11/24 1148  BP: 130/80   Pulse: 91   Resp: 17   Temp: 97.7 F  (36.5 C)   TempSrc: Temporal   SpO2: 94%   Weight: 117 lb 12.8 oz (53.4 kg)   PainSc:  0-No pain   Body mass index is 19.6 kg/m.   Wt Readings from Last 3 Encounters:  03/11/24 117 lb 12.8 oz (53.4 kg)  02/26/24 120 lb (54.4 kg)  02/12/24 127 lb 8 oz (57.8 kg)    Body mass index is 19.6 kg/m.  Performance status (ECOG): 2 - Symptomatic, <50% confined to bed  PHYSICAL EXAM:   GENERAL:alert, no distress and comfortable SKIN: skin color, texture, turgor are normal, no rashes or significant lesions EYES: normal, Conjunctiva are pink and non-injected, sclera clear OROPHARYNX:no exudate, no erythema and lips, buccal mucosa, and tongue normal  NECK: supple, thyroid  normal size, non-tender, without nodularity LYMPH:  no palpable lymphadenopathy in the cervical, axillary or inguinal LUNGS: clear to auscultation and percussion with normal breathing effort.  Breath sounds are diminished throughout the lung fields. HEART: regular rate & rhythm and no murmurs and no lower extremity edema ABDOMEN: Abdomen is slightly firm with moderate  pain with palpation.  Bowel sounds present in all quadrants. Musculoskeletal:no cyanosis of digits and no clubbing  NEURO: alert & oriented x 3 with fluent speech, no focal motor/sensory deficits  LABORATORY DATA:  I have reviewed the data as listed    Component Value Date/Time   NA 134 (L) 03/11/2024 1026   NA 137 05/08/2023 0953   K 4.1 03/11/2024 1026   CL 96 (L) 03/11/2024 1026   CO2 33 (H) 03/11/2024 1026   GLUCOSE 156 (H) 03/11/2024 1026   BUN 11 03/11/2024 1026   BUN 17 05/08/2023 0953   CREATININE 0.55 03/11/2024 1026   CREATININE 0.72 12/02/2017 0945   CALCIUM  9.8 03/11/2024 1026   PROT 7.1 03/11/2024 1026   PROT 7.1 05/08/2023 0953   ALBUMIN 3.6 03/11/2024 1026   ALBUMIN 4.2 05/08/2023 0953   AST 32 03/11/2024 1026   ALT 31 03/11/2024 1026   ALKPHOS 92 03/11/2024 1026   BILITOT 0.5 03/11/2024 1026   GFRNONAA >60 03/11/2024 1026    GFRAA 60 09/26/2020 1245    Lab Results  Component Value Date   WBC 3.7 (L) 03/11/2024   NEUTROABS 2.7 03/11/2024   HGB 11.5 (L) 03/11/2024   HCT 34.8 (L) 03/11/2024   MCV 94.3 03/11/2024   PLT 251 03/11/2024       RADIOGRAPHIC STUDIES: ICT CHEST ABDOMEN PELVIS W CONTRAST Result Date: 03/07/2024 CLINICAL DATA:  Pancreatic cancer, lung cancer restaging * Tracking Code: BO * EXAM: CT CHEST, ABDOMEN, AND PELVIS WITH CONTRAST TECHNIQUE: Multidetector CT imaging of the chest, abdomen and pelvis was performed following the standard protocol during bolus administration of intravenous contrast. RADIATION DOSE REDUCTION: This exam was performed according to the departmental dose-optimization program which includes automated exposure  control, adjustment of the mA and/or kV according to patient size and/or use of iterative reconstruction technique. CONTRAST:  OMNIPAQUE  IOHEXOL  300 MG/ML  SOLN COMPARISON:  CT chest, 01/10/2024, CT chest abdomen pelvis, 11/01/2023 FINDINGS: CT CHEST FINDINGS Cardiovascular: Left chest port catheter. Aortic atherosclerosis. Normal heart size. No pericardial effusion. Mediastinum/Nodes: No enlarged mediastinal, hilar, or axillary lymph nodes. Thyroid  gland, trachea, and esophagus demonstrate no significant findings. Lungs/Pleura: Severe emphysema and diffuse bilateral bronchial wall thickening. Subsolid lesion of the peripheral right upper lobe near a fiducial marker is essentially indistinguishable from background on today's examination with new subpleural consolidation consistent with developing radiation fibrosis (series 4, image 82). Additional adjacent small nodule located medially is unchanged at 0.5 cm (series 4, image 83). Multiple additional subsolid and predominantly solid nodular opacities, predominantly in the periphery of the right lung, several of which are increased in size and solid character, for example in the lateral segment right middle lobe measuring  0.9 x 0.7 cm, previously no greater than 0.3 cm (series 4, image 105), and in the dependent right lower lobe measuring 1.8 x 1.1 cm, previously 1.1 x 0.9 cm (series 4, image 110). Additional subsolid opacity in the dependent left lower lobe not significantly changed at 1.5 x 1.3 cm (series 4, image 73). No pleural effusion or pneumothorax. Musculoskeletal: No chest wall abnormality. No acute osseous findings. CT ABDOMEN PELVIS FINDINGS Hepatobiliary: No solid liver abnormality is seen. No gallstones, gallbladder wall thickening, or biliary dilatation. Pancreas: Slightly diminished size of a hypodense mass of the pancreatic head, measuring 3.0 x 2.3 cm, previously 3.4 x 2.7 cm (series 2, image 80). No significant change in diffuse pancreatic ductal dilatation and atrophy of the remaining pancreatic parenchyma, duct measuring up to 0.8 cm in caliber (series 2, image 76). Spleen: Normal in size without significant abnormality. Adrenals/Urinary Tract: Adrenal glands are unremarkable. Kidneys are normal, without renal calculi, solid lesion, or hydronephrosis. Bladder is unremarkable. Stomach/Bowel: Stomach is within normal limits. Appendix appears normal. No evidence of bowel wall thickening, distention, or inflammatory changes. Sigmoid diverticulosis. Vascular/Lymphatic: Severe aortic atherosclerosis. No enlarged abdominal or pelvic lymph nodes. Reproductive: Status post hysterectomy. Other: No abdominal wall hernia or abnormality. No ascites. Interval enlargement of a cystic lesion with some peripheral soft tissue in the left lower quadrant, measuring 4.8 x 4.0 cm, previously 4.0 x 3.2 cm (series 2, image 93). Musculoskeletal: No acute osseous findings. IMPRESSION: 1. Slightly diminished size of a hypodense mass of the pancreatic head, consistent with treatment response. No significant change in diffuse pancreatic ductal dilatation and atrophy of the remaining pancreatic parenchyma. 2. Subsolid lung lesion of the  peripheral right upper lobe near a fiducial marker is essentially indistinguishable from background on today's examination with new subpleural consolidation consistent with developing radiation fibrosis. Findings consistent with treatment response of the lesion to local radiation therapy. 3. Multiple additional subsolid and predominantly solid nodular opacities, predominantly in the periphery of the right lung, several of which are increased in size and solid character, appearance and behavior most consistent with worsened multifocal pulmonary adenocarcinoma although pulmonary metastases not generally excluded in the setting of pancreatic adenocarcinoma. 4. Interval enlargement of a cystic lesion with some peripheral soft tissue in the left lower quadrant, of uncertain nature. This is possibly an unusual cystic metastasis or possibly of ovarian origin if there has been ovarian transposition. 5. Severe emphysema. Aortic Atherosclerosis (ICD10-I70.0) and Emphysema (ICD10-J43.9). Electronically Signed   By: Fredricka Jenny M.D.   On: 03/07/2024 08:02  Addendum I have seen the patient, examined her. I agree with the assessment and and plan and have edited the notes.   Patient is here for cycle 4-day 1 chemotherapy, she is tolerating chemotherapy moderately well.  I reviewed her restaging CT scan from March 05, 2024, which showed partial response.  She had a biopsy confirmed lung adenocarcinoma, status post SBRT.  On the recent CT scan, she does have multiple lung nodules, and she will follow-up with Dr. Eloise Hake.  We discussed consolidation radiation to her pancreatic cancer she wants to take a break from chemo.  She agrees to continue chemotherapy for now, will message Dr. Eloise Hake about her radiation treatment. All questions were answered.  I spent a total of 30 minutes for her visit today, more than 50% time on face-to-face counseling.  Sonja Rhinelander MD 03/11/2024

## 2024-03-11 ENCOUNTER — Inpatient Hospital Stay (HOSPITAL_BASED_OUTPATIENT_CLINIC_OR_DEPARTMENT_OTHER): Payer: Medicare Other | Admitting: Nurse Practitioner

## 2024-03-11 ENCOUNTER — Inpatient Hospital Stay: Payer: Medicare Other

## 2024-03-11 VITALS — BP 130/80 | HR 91 | Temp 97.7°F | Resp 17 | Wt 117.8 lb

## 2024-03-11 DIAGNOSIS — Z79899 Other long term (current) drug therapy: Secondary | ICD-10-CM | POA: Diagnosis not present

## 2024-03-11 DIAGNOSIS — I700 Atherosclerosis of aorta: Secondary | ICD-10-CM | POA: Diagnosis not present

## 2024-03-11 DIAGNOSIS — K862 Cyst of pancreas: Secondary | ICD-10-CM | POA: Diagnosis not present

## 2024-03-11 DIAGNOSIS — Z85828 Personal history of other malignant neoplasm of skin: Secondary | ICD-10-CM | POA: Diagnosis not present

## 2024-03-11 DIAGNOSIS — Z833 Family history of diabetes mellitus: Secondary | ICD-10-CM | POA: Diagnosis not present

## 2024-03-11 DIAGNOSIS — Z90710 Acquired absence of both cervix and uterus: Secondary | ICD-10-CM | POA: Diagnosis not present

## 2024-03-11 DIAGNOSIS — Z90722 Acquired absence of ovaries, bilateral: Secondary | ICD-10-CM | POA: Diagnosis not present

## 2024-03-11 DIAGNOSIS — Z79631 Long term (current) use of antimetabolite agent: Secondary | ICD-10-CM | POA: Diagnosis not present

## 2024-03-11 DIAGNOSIS — C250 Malignant neoplasm of head of pancreas: Secondary | ICD-10-CM

## 2024-03-11 DIAGNOSIS — U071 COVID-19: Secondary | ICD-10-CM | POA: Diagnosis not present

## 2024-03-11 DIAGNOSIS — C3411 Malignant neoplasm of upper lobe, right bronchus or lung: Secondary | ICD-10-CM | POA: Diagnosis not present

## 2024-03-11 DIAGNOSIS — M8580 Other specified disorders of bone density and structure, unspecified site: Secondary | ICD-10-CM | POA: Diagnosis not present

## 2024-03-11 DIAGNOSIS — I10 Essential (primary) hypertension: Secondary | ICD-10-CM | POA: Diagnosis not present

## 2024-03-11 DIAGNOSIS — Z79633 Long term (current) use of mitotic inhibitor: Secondary | ICD-10-CM | POA: Diagnosis not present

## 2024-03-11 DIAGNOSIS — R0902 Hypoxemia: Secondary | ICD-10-CM | POA: Diagnosis not present

## 2024-03-11 DIAGNOSIS — K5730 Diverticulosis of large intestine without perforation or abscess without bleeding: Secondary | ICD-10-CM | POA: Diagnosis not present

## 2024-03-11 DIAGNOSIS — Z803 Family history of malignant neoplasm of breast: Secondary | ICD-10-CM | POA: Diagnosis not present

## 2024-03-11 DIAGNOSIS — R109 Unspecified abdominal pain: Secondary | ICD-10-CM | POA: Diagnosis not present

## 2024-03-11 DIAGNOSIS — C562 Malignant neoplasm of left ovary: Secondary | ICD-10-CM | POA: Diagnosis not present

## 2024-03-11 DIAGNOSIS — Z95828 Presence of other vascular implants and grafts: Secondary | ICD-10-CM

## 2024-03-11 DIAGNOSIS — K5900 Constipation, unspecified: Secondary | ICD-10-CM | POA: Diagnosis not present

## 2024-03-11 DIAGNOSIS — F17290 Nicotine dependence, other tobacco product, uncomplicated: Secondary | ICD-10-CM | POA: Diagnosis not present

## 2024-03-11 DIAGNOSIS — Z860100 Personal history of colon polyps, unspecified: Secondary | ICD-10-CM | POA: Diagnosis not present

## 2024-03-11 DIAGNOSIS — R112 Nausea with vomiting, unspecified: Secondary | ICD-10-CM | POA: Diagnosis not present

## 2024-03-11 DIAGNOSIS — Z5111 Encounter for antineoplastic chemotherapy: Secondary | ICD-10-CM | POA: Diagnosis not present

## 2024-03-11 LAB — CBC WITH DIFFERENTIAL (CANCER CENTER ONLY)
Abs Immature Granulocytes: 0.01 10*3/uL (ref 0.00–0.07)
Basophils Absolute: 0 10*3/uL (ref 0.0–0.1)
Basophils Relative: 1 %
Eosinophils Absolute: 0.1 10*3/uL (ref 0.0–0.5)
Eosinophils Relative: 2 %
HCT: 34.8 % — ABNORMAL LOW (ref 36.0–46.0)
Hemoglobin: 11.5 g/dL — ABNORMAL LOW (ref 12.0–15.0)
Immature Granulocytes: 0 %
Lymphocytes Relative: 8 %
Lymphs Abs: 0.3 10*3/uL — ABNORMAL LOW (ref 0.7–4.0)
MCH: 31.2 pg (ref 26.0–34.0)
MCHC: 33 g/dL (ref 30.0–36.0)
MCV: 94.3 fL (ref 80.0–100.0)
Monocytes Absolute: 0.6 10*3/uL (ref 0.1–1.0)
Monocytes Relative: 16 %
Neutro Abs: 2.7 10*3/uL (ref 1.7–7.7)
Neutrophils Relative %: 73 %
Platelet Count: 251 10*3/uL (ref 150–400)
RBC: 3.69 MIL/uL — ABNORMAL LOW (ref 3.87–5.11)
RDW: 14.7 % (ref 11.5–15.5)
WBC Count: 3.7 10*3/uL — ABNORMAL LOW (ref 4.0–10.5)
nRBC: 0 % (ref 0.0–0.2)

## 2024-03-11 LAB — CMP (CANCER CENTER ONLY)
ALT: 31 U/L (ref 0–44)
AST: 32 U/L (ref 15–41)
Albumin: 3.6 g/dL (ref 3.5–5.0)
Alkaline Phosphatase: 92 U/L (ref 38–126)
Anion gap: 5 (ref 5–15)
BUN: 11 mg/dL (ref 8–23)
CO2: 33 mmol/L — ABNORMAL HIGH (ref 22–32)
Calcium: 9.8 mg/dL (ref 8.9–10.3)
Chloride: 96 mmol/L — ABNORMAL LOW (ref 98–111)
Creatinine: 0.55 mg/dL (ref 0.44–1.00)
GFR, Estimated: >60 mL/min (ref >60)
Glucose, Bld: 156 mg/dL — ABNORMAL HIGH (ref 70–99)
Potassium: 4.1 mmol/L (ref 3.5–5.1)
Sodium: 134 mmol/L — ABNORMAL LOW (ref 135–145)
Total Bilirubin: 0.5 mg/dL (ref 0.0–1.2)
Total Protein: 7.1 g/dL (ref 6.5–8.1)

## 2024-03-11 MED ORDER — SODIUM CHLORIDE 0.9% FLUSH: 10.0000 mL | INTRAVENOUS | Status: DC | PRN

## 2024-03-11 MED ORDER — SODIUM CHLORIDE 0.9 % IV SOLN
800.0000 mg/m2 | Freq: Once | INTRAVENOUS | Status: AC
  Administered 2024-03-11: 1369 mg via INTRAVENOUS
  Filled 2024-03-11: qty 36.01

## 2024-03-11 MED ORDER — PROCHLORPERAZINE MALEATE 10 MG PO TABS
10.0000 mg | ORAL_TABLET | Freq: Once | ORAL | Status: AC
  Administered 2024-03-11: 10 mg via ORAL
  Filled 2024-03-11: qty 1

## 2024-03-11 MED ORDER — SODIUM CHLORIDE 0.9 % IV SOLN
INTRAVENOUS | Status: DC
Start: 2024-03-11 — End: 2024-03-11

## 2024-03-11 MED ORDER — PACLITAXEL PROTEIN-BOUND CHEMO INJECTION 100 MG
150.0000 mg | Freq: Once | INTRAVENOUS | Status: AC
  Administered 2024-03-11: 150 mg via INTRAVENOUS
  Filled 2024-03-11: qty 30

## 2024-03-11 MED ORDER — SODIUM CHLORIDE 0.9% FLUSH
10.0000 mL | Freq: Once | INTRAVENOUS | Status: AC
  Administered 2024-03-11: 10 mL

## 2024-03-11 MED ORDER — HEPARIN SOD (PORK) LOCK FLUSH 100 UNIT/ML IV SOLN
500.0000 [IU] | Freq: Once | INTRAVENOUS | Status: DC | PRN
Start: 2024-03-11 — End: 2024-03-11

## 2024-03-11 NOTE — Patient Instructions (Signed)
 [facility] - A DEPT OF [facility]  Discharge Instructions: Thank you for choosing [facility] to provide your oncology and hematology care.   If you have a lab appointment with the Cancer Center, please go directly to the Cancer Center and check in at the registration area.   Wear comfortable clothing and clothing appropriate for easy access to any Portacath or PICC line.   We strive to give you quality time with your provider. You may need to reschedule your appointment if you arrive late (15 or more minutes).  Arriving late affects you and other patients whose appointments are after yours.  Also, if you miss three or more appointments without notifying the office, you may be dismissed from the clinic at the provider's discretion.      For prescription refill requests, have your pharmacy contact our office and allow 72 hours for refills to be completed.    Today you received the following chemotherapy and/or immunotherapy agents: Abraxane/Gemzar      To help prevent nausea and vomiting after your treatment, we encourage you to take your nausea medication as directed.  BELOW ARE SYMPTOMS THAT SHOULD BE REPORTED IMMEDIATELY: *FEVER GREATER THAN 100.4 F (38 C) OR HIGHER *CHILLS OR SWEATING *NAUSEA AND VOMITING THAT IS NOT CONTROLLED WITH YOUR NAUSEA MEDICATION *UNUSUAL SHORTNESS OF BREATH *UNUSUAL BRUISING OR BLEEDING *URINARY PROBLEMS (pain or burning when urinating, or frequent urination) *BOWEL PROBLEMS (unusual diarrhea, constipation, pain near the anus) TENDERNESS IN MOUTH AND THROAT WITH OR WITHOUT PRESENCE OF ULCERS (sore throat, sores in mouth, or a toothache) UNUSUAL RASH, SWELLING OR PAIN  UNUSUAL VAGINAL DISCHARGE OR ITCHING   Items with * indicate a potential emergency and should be followed up as soon as possible or go to the Emergency Department if any problems should occur.  Please show the CHEMOTHERAPY ALERT CARD or  IMMUNOTHERAPY ALERT CARD at check-in to the Emergency Department and triage nurse.  Should you have questions after your visit or need to cancel or reschedule your appointment, please contact [facility] - A DEPT OF [facility]  Dept: [phone]  and follow the prompts.  Office hours are 8:00 a.m. to 4:30 p.m. Monday - Friday. Please note that voicemails left after 4:00 p.m. may not be returned until the following business day.  We are closed weekends and major holidays. You have access to a nurse at all times for urgent questions. Please call the main number to the clinic Dept: [phone] and follow the prompts.   For any non-urgent questions, you may also contact your provider using MyChart. We now offer e-Visits for anyone 49 and older to request care online for non-urgent symptoms. For details visit [email or web address].   Also download the MyChart app! Go to the app store, search "MyChart", open the app, select [place], and log in with your MyChart username and password.

## 2024-03-11 NOTE — Progress Notes (Signed)
 Ok to reduce Abraxane dose to 150mg  today (~100mg /m2, BSA=1.56) due to weight loss per Dr. Maryalice Smaller.   Little [place], PharmD, MBA

## 2024-03-12 LAB — CANCER ANTIGEN 19-9: CA 19-9: 12 U/mL (ref 0–35)

## 2024-03-19 ENCOUNTER — Other Ambulatory Visit: Payer: Self-pay | Admitting: Gynecologic Oncology

## 2024-03-19 DIAGNOSIS — D3910 Neoplasm of uncertain behavior of unspecified ovary: Secondary | ICD-10-CM

## 2024-03-19 DIAGNOSIS — J449 Chronic obstructive pulmonary disease, unspecified: Secondary | ICD-10-CM | POA: Diagnosis not present

## 2024-03-19 DIAGNOSIS — J9601 Acute respiratory failure with hypoxia: Secondary | ICD-10-CM | POA: Diagnosis not present

## 2024-03-20 ENCOUNTER — Other Ambulatory Visit: Payer: Self-pay

## 2024-03-20 ENCOUNTER — Inpatient Hospital Stay: Payer: Medicare Other | Admitting: Gynecologic Oncology

## 2024-03-20 ENCOUNTER — Encounter: Payer: Self-pay | Admitting: Gynecologic Oncology

## 2024-03-20 ENCOUNTER — Inpatient Hospital Stay: Payer: Medicare Other

## 2024-03-20 VITALS — BP 119/60 | HR 88 | Temp 98.4°F | Resp 16 | Ht 65.0 in | Wt 117.8 lb

## 2024-03-20 DIAGNOSIS — K862 Cyst of pancreas: Secondary | ICD-10-CM | POA: Diagnosis not present

## 2024-03-20 DIAGNOSIS — Z79899 Other long term (current) drug therapy: Secondary | ICD-10-CM | POA: Diagnosis not present

## 2024-03-20 DIAGNOSIS — J449 Chronic obstructive pulmonary disease, unspecified: Secondary | ICD-10-CM | POA: Diagnosis not present

## 2024-03-20 DIAGNOSIS — C562 Malignant neoplasm of left ovary: Secondary | ICD-10-CM

## 2024-03-20 DIAGNOSIS — I10 Essential (primary) hypertension: Secondary | ICD-10-CM | POA: Diagnosis not present

## 2024-03-20 DIAGNOSIS — F17290 Nicotine dependence, other tobacco product, uncomplicated: Secondary | ICD-10-CM | POA: Diagnosis not present

## 2024-03-20 DIAGNOSIS — Z90710 Acquired absence of both cervix and uterus: Secondary | ICD-10-CM | POA: Diagnosis not present

## 2024-03-20 DIAGNOSIS — Z79631 Long term (current) use of antimetabolite agent: Secondary | ICD-10-CM | POA: Diagnosis not present

## 2024-03-20 DIAGNOSIS — C250 Malignant neoplasm of head of pancreas: Secondary | ICD-10-CM | POA: Diagnosis not present

## 2024-03-20 DIAGNOSIS — Z803 Family history of malignant neoplasm of breast: Secondary | ICD-10-CM | POA: Diagnosis not present

## 2024-03-20 DIAGNOSIS — R112 Nausea with vomiting, unspecified: Secondary | ICD-10-CM | POA: Diagnosis not present

## 2024-03-20 DIAGNOSIS — C3411 Malignant neoplasm of upper lobe, right bronchus or lung: Secondary | ICD-10-CM

## 2024-03-20 DIAGNOSIS — K5730 Diverticulosis of large intestine without perforation or abscess without bleeding: Secondary | ICD-10-CM | POA: Diagnosis not present

## 2024-03-20 DIAGNOSIS — Z860100 Personal history of colon polyps, unspecified: Secondary | ICD-10-CM | POA: Diagnosis not present

## 2024-03-20 DIAGNOSIS — R0902 Hypoxemia: Secondary | ICD-10-CM | POA: Diagnosis not present

## 2024-03-20 DIAGNOSIS — U071 COVID-19: Secondary | ICD-10-CM | POA: Diagnosis not present

## 2024-03-20 DIAGNOSIS — K5900 Constipation, unspecified: Secondary | ICD-10-CM | POA: Diagnosis not present

## 2024-03-20 DIAGNOSIS — C259 Malignant neoplasm of pancreas, unspecified: Secondary | ICD-10-CM | POA: Diagnosis not present

## 2024-03-20 DIAGNOSIS — Z85828 Personal history of other malignant neoplasm of skin: Secondary | ICD-10-CM | POA: Diagnosis not present

## 2024-03-20 DIAGNOSIS — Z833 Family history of diabetes mellitus: Secondary | ICD-10-CM | POA: Diagnosis not present

## 2024-03-20 DIAGNOSIS — Z5111 Encounter for antineoplastic chemotherapy: Secondary | ICD-10-CM | POA: Diagnosis not present

## 2024-03-20 DIAGNOSIS — M8580 Other specified disorders of bone density and structure, unspecified site: Secondary | ICD-10-CM | POA: Diagnosis not present

## 2024-03-20 DIAGNOSIS — R109 Unspecified abdominal pain: Secondary | ICD-10-CM | POA: Diagnosis not present

## 2024-03-20 DIAGNOSIS — D3910 Neoplasm of uncertain behavior of unspecified ovary: Secondary | ICD-10-CM

## 2024-03-20 DIAGNOSIS — Z90722 Acquired absence of ovaries, bilateral: Secondary | ICD-10-CM | POA: Diagnosis not present

## 2024-03-20 DIAGNOSIS — I700 Atherosclerosis of aorta: Secondary | ICD-10-CM | POA: Diagnosis not present

## 2024-03-20 DIAGNOSIS — Z79633 Long term (current) use of mitotic inhibitor: Secondary | ICD-10-CM | POA: Diagnosis not present

## 2024-03-20 NOTE — Patient Instructions (Signed)
 It was good to see you today.  I will let you know when I get your lab result back next week.  I will plan to see you in 4 months but please reach out if you need anything before that.

## 2024-03-20 NOTE — Progress Notes (Signed)
 Gynecologic Oncology Return Clinic Visit  03/20/24  Reason for Visit: follow-up  Treatment History: Oncology History  Adenocarcinoma of upper lobe of right lung (HCC)  08/06/2023 Initial Diagnosis   Adenocarcinoma of upper lobe of right lung (HCC)   08/06/2023 Cancer Staging   Staging form: Lung, AJCC 8th Edition - Clinical: Stage IB (cT2a, cN0, cM0) - Signed by Marlene Simas, MD on 08/06/2023   10/14/2023 Genetic Testing   Negative genetic testing on the CancerNext-Expanded+RNAinsight.  The report date is 10/13/2023.  The CancerNext-Expanded gene panel offered by [employer] and includes sequencing and rearrangement analysis for the following 71 genes: AIP, ALK, APC, ATM, BAP1, BARD1, BMPR1A, BRCA1, BRCA2, BRIP1, CDC73, CDH1, CDK4, CDKN1B, CDKN2A, CHEK2, DICER1, FH, FLCN, KIF1B, LZTR1, MAX, MEN1, MET, MLH1, MSH2, MSH6, MUTYH, NF1, NF2, NTHL1, PALB2, PHOX2B, PMS2, POT1, PRKAR1A, PTCH1, PTEN, RAD51C, RAD51D, RB1, RET, SDHA, SDHAF2, SDHB, SDHC, SDHD, SMAD4, SMARCA4, SMARCB1, SMARCE1, STK11, SUFU, TMEM127, TP53, TSC1, TSC2 and VHL (sequencing and deletion/duplication); AXIN2, CTNNA1, EGFR, EGLN1, HOXB13, KIT, MITF, MSH3, PDGFRA, POLD1 and POLE (sequencing only); EPCAM and GREM1 (deletion/duplication only). RNA data is routinely analyzed for use in variant interpretation for all genes.    Pancreatic cancer (HCC)  10/07/2023 Cancer Staging   Staging form: Exocrine Pancreas, AJCC 8th Edition - Clinical stage from 10/07/2023: Stage IB (cT2, cN0, cM0) - Signed by Sonja Peru, MD on 10/25/2023 Total positive nodes: 0   10/25/2023 Initial Diagnosis   Pancreatic cancer (HCC)   12/04/2023 -  Chemotherapy   [name] : PANCREATIC Abraxane  D1,8,15 + Gemcitabine  D1,8,15 q28d     03/05/2024 Imaging   CT CAP with contrast IMPRESSION: 1. Slightly diminished size of a hypodense mass of the pancreatic head, consistent with treatment response. No significant change in diffuse  pancreatic ductal dilatation and atrophy of the remaining pancreatic parenchyma. 2. Subsolid lung lesion of the peripheral right upper lobe near a fiducial marker is essentially indistinguishable from background on today's examination with new subpleural consolidation consistent with developing radiation fibrosis. Findings consistent with treatment response of the lesion to local radiation therapy. 3. Multiple additional subsolid and predominantly solid nodular opacities, predominantly in the periphery of the right lung, several of which are increased in size and solid character, appearance and behavior most consistent with worsened multifocal pulmonary adenocarcinoma although pulmonary metastases not generally excluded in the setting of pancreatic adenocarcinoma. 4. Interval enlargement of a cystic lesion with some peripheral soft tissue in the left lower quadrant, of uncertain nature. This is possibly an unusual cystic metastasis or possibly of ovarian origin if there has been ovarian transposition. 5. Severe emphysema.      The patient had a recent CT scan in the setting of her COPD and lung cancer screening.  CT of the chest in early June showed a new part solid masslike lesion in the posterior segment right upper lobe concerning for possible malignancy.  PET scan was performed on 7/15 and shows a mixed attenuation right upper lobe lung mass that is hypermetabolic, most consistent with primary bronchogenic carcinoma.  Incidental finding of a 12.4 cm partially solid, partially cystic mass in the anterior pelvis suspected to be arising from the ovary.  There is hypermetabolism cups corresponding to soft tissue fullness about the pancreatic head and descending duodenum, with differential diagnosis including pancreatic head carcinoma or focal pancreatitis.   The patient saw Dr. Baldwin Levee on 7/31.  She is going to be scheduled for navigational bronchoscopy (scheduled on 8/13) to evaluate her  right upper lobe  mass.  Referral was sent to GI in the setting of her pancreatic hypermetabolism.   Her history is notable for stage IA granulosa cell tumor diagnosed in 2007.  At that time, she underwent ex lap, bilateral salpingo-oophorectomy, pelvic lymphadenectomy, and omentectomy with Dr. Willis Harter.  Pathology revealed a 23 cm adult granulosa cell tumor of the left ovary with a benign fallopian tube.  Right tube and ovary were benign.  No involved pelvic lymph nodes.  Small bowel adhesions were submitted and negative for evidence of malignancy.  Inhibin levels at the time of her diagnosis were elevated to 77.  She was last seen in 2012 in our clinic and remained NED.   Inhibin B on 8/9: 1,006.3   07/18/23: Robotic-assisted radical dissection for pelvic mass, lysis of adhesions for approximately 10 minutes, cystoscopy, mini-lap for specimen delivery   She started gemcitabine  and abraxane  on 12/04/23.  Interval History: Patient reports overall doing well although has struggled significantly since starting chemotherapy.  She notes decreased appetite, some intermittent nausea with dry heaving and emesis.  She has lost about 10 pounds since mid March.  She is trying to drink an Ensure daily.  She endorses constipation, uses Dulcolax as needed.  Denies any urinary symptoms or vaginal bleeding.  Since the last time I saw her, she has started using oxygen  all the time.  Moving around some on her own, sometimes short of breath with ambulation.  Past Medical/Surgical History: Past Medical History:  Diagnosis Date   Abnormal PFT 09/02/2014   moderate restrictive ventilatory defect.   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 Fleurette Humbles   Colon polyp 12/2003   Diverticulosis 11/2010   seen on colonoscopy and air contrast BE   Emphysema of lung (HCC)    Family history of breast cancer    GERD  (gastroesophageal reflux disease)    History of radiation therapy    Right Lung-09/04/23-09/09/23- Dr. Retta Caster   Hyperlipidemia    Hypertension    previously treated with meds, resolved   MGUS (monoclonal gammopathy of unknown significance) 1999   prevously under care of Dr. Sharalyn Dasen   On home oxygen  therapy    at [facility]   Osteopenia    DEXA 01/31/2010 at [facility]; 2014 at [facility]   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   Tobacco abuse    quit in 2019    Past Surgical History:  Procedure Laterality Date   BASAL CELL CARCINOMA EXCISION  11/26/2008   L face, R temple   BILATERAL SALPINGOOPHORECTOMY  11/26/2005   cancerous tumor R ovary   BIOPSY  10/07/2023   Procedure: BIOPSY;  Surgeon: Normie Becton., MD;  Location: [facility];  Service: Gastroenterology;;   BRONCHIAL BIOPSY  07/09/2023   Procedure: BRONCHIAL BIOPSIES;  Surgeon: Denson Flake, MD;  [facility] ENDOSCOPY;  Service: Pulmonary;;   BRONCHIAL BRUSHINGS  07/09/2023   Procedure: BRONCHIAL BRUSHINGS;  Surgeon: Denson Flake, MD;  Location: [facility] ENDOSCOPY;  Service: Pulmonary;;   BRONCHIAL NEEDLE ASPIRATION BIOPSY  07/09/2023   Procedure: BRONCHIAL NEEDLE ASPIRATION BIOPSIES;  Surgeon: Denson Flake, MD;  Location: [facility] ENDOSCOPY;  Service: Pulmonary;;   CATARACT EXTRACTION  11/27/2007   bilateral   COLONOSCOPY  11/26/2010   Dr. Lincoln Renshaw. Diverticulosis. BE also done--normal   ESOPHAGOGASTRODUODENOSCOPY (EGD) WITH PROPOFOL  N/A 10/07/2023   Procedure: ESOPHAGOGASTRODUODENOSCOPY (EGD) WITH PROPOFOL ;  Surgeon: Brice Campi Albino Alu., MD;  Location: [facility];  Service: Gastroenterology;  Laterality: N/A;   EUS N/A 10/07/2023   Procedure: UPPER ENDOSCOPIC ULTRASOUND (EUS) RADIAL;  Surgeon: Normie Becton., MD;  Location: [facility];  Service: Gastroenterology;  Laterality: N/A;   FIDUCIAL MARKER PLACEMENT  07/09/2023   Procedure: FIDUCIAL MARKER PLACEMENT;  Surgeon: Denson Flake, MD;  Location[facility] ENDOSCOPY;  Service: Pulmonary;;   FINE NEEDLE ASPIRATION N/A 10/07/2023   Procedure: FINE NEEDLE ASPIRATION (FNA) LINEAR;  Surgeon: Normie Becton., MD;  Location: [facility];  Service: Gastroenterology;  Laterality: N/A;   OMENTECTOMY  11/26/2005   and pelvic lymphadenectomy (with BSO)   PORTACATH PLACEMENT N/A 11/22/2023   Procedure: INSERTION PORT-A-CATH;  Surgeon: Lockie Rima, MD;  Location: [facility] OR;  Service: General;  Laterality: N/A;   ROBOTIC ASSISTED LAPAROSCOPIC LYSIS OF ADHESION N/A 07/18/2023   Procedure: ROBOTIC ASSISTED PELVIC MASS EXCISION WITH RADICAL DISSECTION, MINI LAPAROTOMY, CYSTOSCOPY;  Surgeon: Suzi Essex, MD;  Location: [facility]S;  Service: Gynecology;  Laterality: N/A;   SKIN CANCER EXCISION Left 07/2021   neck; pt can't recall type of cancer   TONSILLECTOMY  age 49   VAGINAL HYSTERECTOMY  late 20's   "didn't want any more children"    Family History  Problem Relation Age of Onset   Stroke Mother    Cerebral aneurysm Mother    Hypertension Father    Suicidality Father    Diabetes Maternal Aunt    Diabetes Maternal Aunt    Breast cancer Maternal Aunt    Diabetes Maternal Aunt    Breast cancer Maternal Aunt    Throat cancer Maternal Uncle    Lung cancer Maternal Uncle    Colon cancer Neg Hx    Ovarian cancer Neg Hx    Endometrial cancer Neg Hx    Pancreatic cancer Neg Hx     Social History   Socioeconomic History   Marital status: Married    Spouse name: Not on file   Number of children: 1   Years of education: Not on file   Highest education level: Not on file  Occupational History   Occupation: retired Warehouse manager company)    Employer: RETIRED  Tobacco Use   Smoking status: Former    Current packs/day: 0.00     Average packs/day: 2.0 packs/day for 59.8 years (119.6 ttl pk-yrs)    Types: Cigarettes    Start date: 19    Quit date: 09/10/2018    Years since quitting: 5.5    Passive exposure: Past (quit in 2019)   Smokeless tobacco: Never  Vaping Use   Vaping status: Some Days  Substance and Sexual Activity   Alcohol use: Not Currently    Comment: rarely   Drug use: No   Sexual activity: Not Currently    Partners: Male  Other Topics Concern   Not on file  Social History Narrative   Lives with husband, 1 cat.  Son lives with him during the week, stays with his girlfriend on the weekends (divorced).   1 grandson.  Expecting a great-grandbaby      Updated 04/2023   Social Drivers of Health   Financial Resource Strain: Not on file  Food Insecurity: No Food Insecurity (02/17/2024)  Hunger Vital Sign    Worried About Running Out of Food in the Last Year: Never true    Ran Out of Food in the Last Year: Never true  Transportation Needs: No Transportation Needs (02/17/2024)   PRAPARE - Administrator, Civil Service (Medical): No    Lack of Transportation (Non-Medical): No  Physical Activity: Not on file  Stress: Not on file  Social Connections: Moderately Isolated (02/12/2024)   Social Connection and Isolation Panel [NHANES]    Frequency of Communication with Friends and Family: More than three times a week    Frequency of Social Gatherings with Friends and Family: More than three times a week    Attends Religious Services: 1 to 4 times per year    Active Member of [employer] or Organizations: No    Attends Banker Meetings: Never    Marital Status: Never married    Current Medications:  Current Outpatient Medications:    acetaminophen  (TYLENOL ) 500 MG tablet, Take 2 tablets (1,000 mg total) by mouth every 8 (eight) hours as needed for mild pain (pain score 1-3). May use Tylenol  for next 1-3 days if needed for abdominal pain, but otherwise call office if any issues  arise (Patient taking differently: Take 1,000 mg by mouth every 8 (eight) hours as needed for mild pain (pain score 1-3).), Disp: , Rfl:    albuterol  (VENTOLIN  HFA) 108 (90 Base) MCG/ACT inhaler, Inhale 2 puffs into the lungs every 4 (four) hours as needed for wheezing or shortness of breath., Disp: 6.7 g, Rfl: 0   albuterol  (VENTOLIN  HFA) 108 (90 Base) MCG/ACT inhaler, INHALE 2 PUFFS BY MOUTH EVERY 6 HOURS AS NEEDED FOR WHEEZING FOR SHORTNESS OF BREATH . APPOINTMENT REQUIRED FOR FUTURE REFILLS (Patient taking differently: Inhale 2 puffs into the lungs every 6 (six) hours as needed for wheezing or shortness of breath.), Disp: 9 g, Rfl: 3   cholecalciferol (VITAMIN D3) 25 MCG (1000 UNIT) tablet, Take 1,000 Units by mouth daily., Disp: , Rfl:    Coenzyme Q10 (COQ10) 100 MG CAPS, Take 100 mg by mouth at bedtime., Disp: , Rfl:    lidocaine -prilocaine  (EMLA ) cream, Apply to affected area once (Patient taking differently: Apply 1 Application topically as needed (for port access).), Disp: 30 g, Rfl: 3   losartan  (COZAAR ) 50 MG tablet, Take 1 tablet (50 mg total) by mouth daily., Disp: 90 tablet, Rfl: 3   magic mouthwash (nystatin , lidocaine , diphenhydrAMINE, alum & mag hydroxide) suspension, Take 5 mLs by mouth 4 (four) times daily as needed for mouth pain., Disp: 400 mL, Rfl: 0   ondansetron  (ZOFRAN ) 8 MG tablet, Take 1 tablet (8 mg total) by mouth every 8 (eight) hours as needed for nausea or vomiting., Disp: 30 tablet, Rfl: 1   predniSONE  (DELTASONE ) 10 MG tablet, Prednisone  40 mg po daily x  2 day then Prednisone  30 mg po daily x 2 day then Prednisone  20 mg po daily x 2 day then Prednisone  10 mg daily x 2 day then stop..., Disp: 20 tablet, Rfl: 0   prochlorperazine  (COMPAZINE ) 10 MG tablet, Take 1 tablet (10 mg total) by mouth every 6 (six) hours as needed for nausea or vomiting., Disp: 30 tablet, Rfl: 1   rosuvastatin  (CRESTOR ) 10 MG tablet, Take 1 tablet (10 mg total) by mouth daily. (Patient taking  differently: Take 10 mg by mouth at bedtime.), Disp: 90 tablet, Rfl: 3   Tiotropium Bromide -Olodaterol (STIOLTO RESPIMAT ) 2.5-2.5 MCG/ACT AERS, INHALE 2 PUFFS BY  MOUTH ONCE DAILY (Patient taking differently: Inhale 2 puffs into the lungs in the morning.), Disp: 4 g, Rfl: 11  Review of Systems: + Decreased appetite, shortness of breath Denies fevers, chills, fatigue, unexplained weight changes. Denies hearing loss, neck lumps or masses, mouth sores, ringing in ears or voice changes. Denies cough or wheezing.   Denies chest pain or palpitations. Denies leg swelling. Denies abdominal distention, pain, blood in stools, constipation, diarrhea, nausea, vomiting, or early satiety. Denies pain with intercourse, dysuria, frequency, hematuria or incontinence. Denies hot flashes, pelvic pain, vaginal bleeding or vaginal discharge.   Denies joint pain, back pain or muscle pain/cramps. Denies itching, rash, or wounds. Denies dizziness, headaches, numbness or seizures. Denies swollen lymph nodes or glands, denies easy bruising or bleeding. Denies anxiety, depression, confusion, or decreased concentration.  Physical Exam: BP 119/60 (BP Location: Left Arm, Patient Position: Sitting)   Pulse 88   Temp 98.4 F (36.9 C) (Oral)   Resp 16   Ht 5\' 5"  (1.651 m)   Wt 117 lb 12.8 oz (53.4 kg)   SpO2 100%   BMI 19.60 kg/m  General: Alert, oriented, no acute distress.  Cachectic. HEENT: Normocephalic, atraumatic, sclera anicteric. Chest: Mild expiratory wheezes, mildly increased work of breathing, lungs otherwise clear to auscultation bilaterally.   Cardiovascular: Regular rate and rhythm, no murmurs. Abdomen: Examined in her wheelchair. Abdomen soft, nontender.  Normoactive bowel sounds.  No masses or hepatosplenomegaly appreciated.  Well-healed incisions. Extremities: Grossly normal range of motion.  Warm, well perfused.  No edema bilaterally. Lymphatics: No cervical, supraclavicular. GU:  Deferred.  Laboratory & Radiologic Studies:     Component Ref Range & Units (hover) 3 mo ago 8 mo ago  Inhibin B 61.4 High  1,006.3 High       03/05/24: 1. Slightly diminished size of a hypodense mass of the pancreatic head, consistent with treatment response. No significant change in diffuse pancreatic ductal dilatation and atrophy of the remaining pancreatic parenchyma. 2. Subsolid lung lesion of the peripheral right upper lobe near a fiducial marker is essentially indistinguishable from background on today's examination with new subpleural consolidation consistent with developing radiation fibrosis. Findings consistent with treatment response of the lesion to local radiation therapy. 3. Multiple additional subsolid and predominantly solid nodular opacities, predominantly in the periphery of the right lung, several of which are increased in size and solid character, appearance and behavior most consistent with worsened multifocal pulmonary adenocarcinoma although pulmonary metastases not generally excluded in the setting of pancreatic adenocarcinoma. 4. Interval enlargement of a cystic lesion with some peripheral soft tissue in the left lower quadrant, of uncertain nature. This is possibly an unusual cystic metastasis or possibly of ovarian origin if there has been ovarian transposition. 5. Severe emphysema.  Assessment & Plan: Tiffany [name] is a 78 y.o. woman with recurrent granulosa cell tumor, recently diagnosed lung cancer as well as pancreatic cancer.  Patient has had significant decline since I saw her last.  Her functional status has worsened and she has lost a good amount of weight.    We reviewed results of her last imaging earlier this month which show slight decrease size of her pancreatic mass, decreased size of right upper lobe lung lesion, increased size of multiple pulmonary nodules consistent with worsened metastatic disease.  There is a cystic lesion with  some peripheral soft tissue on the left paracolic gutter.  While this certainly could be recurrence of her granulosa cell tumor, the location is somewhat unusual especially since  I do not see any masses in the pelvis.  Inhibin B was drawn today, will await results of this marker.  If her inhibin B has increased, this would be suggestive that this cystic lesion may be related to granulosa cell tumor.  Given her overall health status and to other malignancies, surgical intervention would not be recommended.  I discussed with her again goals of care.  Given toxicity with treatment, we discussed balancing quality of life with treatment goals and toxicity from treatment.  The patient understands that her situation is not curative.  I will see her back for follow-up in 3-4 months.  I asked her to call with any new and concerning symptoms prior to that visit.  26 minutes of total time was spent for this patient encounter, including preparation, face-to-face counseling with the patient and coordination of care, and documentation of the encounter.  Wiley Hanger, MD  Division of Gynecologic Oncology  Department of Obstetrics and Gynecology  [facility] [place]  [facility]

## 2024-03-22 ENCOUNTER — Other Ambulatory Visit: Payer: Self-pay

## 2024-03-24 LAB — INHIBIN B: Inhibin B: 45 pg/mL — ABNORMAL HIGH (ref 0.0–16.9)

## 2024-03-24 NOTE — Assessment & Plan Note (Deleted)
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  -PET scan from June 10, 2023 showed a hypermetabolic 3.1 cm mass in the right upper lobe, and hypermetabolic soft tissue fullness in the pancreatic head, and partially solid and cystic 12.4 cm pelvic mass. 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 Pricilla Holm  -baseline CA19.9 33 -Pt has seen by pancreatobiliary surgeon Dr. Donell Beers, who recommends neoadjuvant chemotherapy due to the uncertainty of lung metastasis.  Patient declined surgery. -Repeated CT on November 01, 2023 showed improved treated lung mass, but several new or enlarging lung nodules, concerning for metastatic disease.  -Patient is agreeable with chemo, she started gemcitabine and Abraxane on 12/04/2023

## 2024-03-24 NOTE — Progress Notes (Signed)
 Tiffany [name] - please let the patient know her Inhibin B has decreased from 3 months ago. This would lead me to favor the cyst we discussed along her right side in the mid abdomen is not related to the granulosa cell tumor history.

## 2024-03-25 ENCOUNTER — Inpatient Hospital Stay: Admitting: Hematology

## 2024-03-25 ENCOUNTER — Encounter: Payer: Self-pay | Admitting: Hematology

## 2024-03-25 ENCOUNTER — Telehealth: Payer: Self-pay | Admitting: *Deleted

## 2024-03-25 ENCOUNTER — Inpatient Hospital Stay

## 2024-03-25 ENCOUNTER — Telehealth: Payer: Self-pay | Admitting: Hematology

## 2024-03-25 DIAGNOSIS — C250 Malignant neoplasm of head of pancreas: Secondary | ICD-10-CM

## 2024-03-25 NOTE — Telephone Encounter (Signed)
 Spoke with Tiffany [name] and relayed message from Dr. Orvil Bland that patient's Inhibin B has decreased from 3 months ago. This would lead me to favor the cyst we discussed along her right side in the mid abdomen is not related to the granulosa cell tumor history. Pt verbalized understanding and was pleased to here this news. Pt thanked the office for calling.

## 2024-03-25 NOTE — Telephone Encounter (Signed)
-----   Message from Suzi Essex sent at 03/24/2024  7:42 PM EDT ----- Tiffany [name] - please let the patient know her Inhibin B has decreased from 3 months ago. This would lead me to favor the cyst we discussed along her right side in the mid abdomen is not related to the granulosa cell tumor history.

## 2024-03-26 ENCOUNTER — Telehealth: Payer: Self-pay | Admitting: Hematology

## 2024-04-01 ENCOUNTER — Inpatient Hospital Stay: Attending: Gynecologic Oncology

## 2024-04-01 ENCOUNTER — Encounter: Payer: Self-pay | Admitting: Hematology

## 2024-04-01 ENCOUNTER — Inpatient Hospital Stay (HOSPITAL_BASED_OUTPATIENT_CLINIC_OR_DEPARTMENT_OTHER): Admitting: Hematology

## 2024-04-01 VITALS — BP 127/67 | HR 94 | Temp 97.7°F | Resp 20 | Ht 65.0 in | Wt 114.1 lb

## 2024-04-01 DIAGNOSIS — Z79899 Other long term (current) drug therapy: Secondary | ICD-10-CM | POA: Insufficient documentation

## 2024-04-01 DIAGNOSIS — Z8701 Personal history of pneumonia (recurrent): Secondary | ICD-10-CM | POA: Insufficient documentation

## 2024-04-01 DIAGNOSIS — C250 Malignant neoplasm of head of pancreas: Secondary | ICD-10-CM | POA: Diagnosis not present

## 2024-04-01 DIAGNOSIS — M8580 Other specified disorders of bone density and structure, unspecified site: Secondary | ICD-10-CM | POA: Insufficient documentation

## 2024-04-01 DIAGNOSIS — Z860100 Personal history of colon polyps, unspecified: Secondary | ICD-10-CM | POA: Diagnosis not present

## 2024-04-01 DIAGNOSIS — Z85828 Personal history of other malignant neoplasm of skin: Secondary | ICD-10-CM | POA: Diagnosis not present

## 2024-04-01 DIAGNOSIS — I10 Essential (primary) hypertension: Secondary | ICD-10-CM | POA: Insufficient documentation

## 2024-04-01 DIAGNOSIS — Z95828 Presence of other vascular implants and grafts: Secondary | ICD-10-CM

## 2024-04-01 DIAGNOSIS — Z90722 Acquired absence of ovaries, bilateral: Secondary | ICD-10-CM | POA: Insufficient documentation

## 2024-04-01 DIAGNOSIS — Z923 Personal history of irradiation: Secondary | ICD-10-CM | POA: Diagnosis not present

## 2024-04-01 DIAGNOSIS — C3411 Malignant neoplasm of upper lobe, right bronchus or lung: Secondary | ICD-10-CM | POA: Diagnosis not present

## 2024-04-01 DIAGNOSIS — R5383 Other fatigue: Secondary | ICD-10-CM | POA: Insufficient documentation

## 2024-04-01 DIAGNOSIS — E785 Hyperlipidemia, unspecified: Secondary | ICD-10-CM | POA: Insufficient documentation

## 2024-04-01 DIAGNOSIS — J439 Emphysema, unspecified: Secondary | ICD-10-CM | POA: Insufficient documentation

## 2024-04-01 DIAGNOSIS — Z90710 Acquired absence of both cervix and uterus: Secondary | ICD-10-CM | POA: Insufficient documentation

## 2024-04-01 DIAGNOSIS — Z5111 Encounter for antineoplastic chemotherapy: Secondary | ICD-10-CM | POA: Diagnosis not present

## 2024-04-01 LAB — CBC WITH DIFFERENTIAL (CANCER CENTER ONLY)
Abs Immature Granulocytes: 0.02 10*3/uL (ref 0.00–0.07)
Basophils Absolute: 0.1 10*3/uL (ref 0.0–0.1)
Basophils Relative: 1 %
Eosinophils Absolute: 0.1 10*3/uL (ref 0.0–0.5)
Eosinophils Relative: 1 %
HCT: 34.4 % — ABNORMAL LOW (ref 36.0–46.0)
Hemoglobin: 12.1 g/dL (ref 12.0–15.0)
Immature Granulocytes: 0 %
Lymphocytes Relative: 14 %
Lymphs Abs: 0.8 10*3/uL (ref 0.7–4.0)
MCH: 33.4 pg (ref 26.0–34.0)
MCHC: 35.2 g/dL (ref 30.0–36.0)
MCV: 95 fL (ref 80.0–100.0)
Monocytes Absolute: 1 10*3/uL (ref 0.1–1.0)
Monocytes Relative: 16 %
Neutro Abs: 4 10*3/uL (ref 1.7–7.7)
Neutrophils Relative %: 68 %
Platelet Count: 284 10*3/uL (ref 150–400)
RBC: 3.62 MIL/uL — ABNORMAL LOW (ref 3.87–5.11)
RDW: 14.8 % (ref 11.5–15.5)
WBC Count: 5.9 10*3/uL (ref 4.0–10.5)
nRBC: 0 % (ref 0.0–0.2)

## 2024-04-01 LAB — CMP (CANCER CENTER ONLY)
ALT: 17 U/L (ref 0–44)
AST: 24 U/L (ref 15–41)
Albumin: 3.7 g/dL (ref 3.5–5.0)
Alkaline Phosphatase: 100 U/L (ref 38–126)
Anion gap: 6 (ref 5–15)
BUN: 11 mg/dL (ref 8–23)
CO2: 35 mmol/L — ABNORMAL HIGH (ref 22–32)
Calcium: 9.3 mg/dL (ref 8.9–10.3)
Chloride: 95 mmol/L — ABNORMAL LOW (ref 98–111)
Creatinine: 0.54 mg/dL (ref 0.44–1.00)
GFR, Estimated: >60 mL/min (ref >60)
Glucose, Bld: 108 mg/dL — ABNORMAL HIGH (ref 70–99)
Potassium: 3.8 mmol/L (ref 3.5–5.1)
Sodium: 136 mmol/L (ref 135–145)
Total Bilirubin: 0.5 mg/dL (ref 0.0–1.2)
Total Protein: 7.3 g/dL (ref 6.5–8.1)

## 2024-04-01 MED ORDER — MIRTAZAPINE 7.5 MG PO TABS: 7.5000 mg | ORAL_TABLET | Freq: Every day | ORAL | 0 refills | Status: DC

## 2024-04-01 MED ORDER — SODIUM CHLORIDE 0.9% FLUSH
10.0000 mL | Freq: Once | INTRAVENOUS | Status: AC
Start: 2024-04-01 — End: 2024-04-01
  Administered 2024-04-01: 10 mL

## 2024-04-01 MED ORDER — HEPARIN SOD (PORK) LOCK FLUSH 100 UNIT/ML IV SOLN
500.0000 [IU] | Freq: Once | INTRAVENOUS | Status: AC
  Administered 2024-04-01: 500 [IU]

## 2024-04-01 NOTE — Assessment & Plan Note (Signed)
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  -PET scan from June 10, 2023 showed a hypermetabolic 3.1 cm mass in the right upper lobe, and hypermetabolic soft tissue fullness in the pancreatic head, and partially solid and cystic 12.4 cm pelvic mass. 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 Orvil Bland  -baseline CA19.9 33 -Pt has seen by pancreatobiliary surgeon Dr. Cherlynn Cornfield, who recommends neoadjuvant chemotherapy due to the uncertainty of lung metastasis.  Patient declined surgery. -Repeated CT on November 01, 2023 showed improved treated lung mass, but several new or enlarging lung nodules, concerning for metastatic disease.  -Patient is agreeable with chemo, she started gemcitabine  and Abraxane  on 12/04/2023 -CT 03/05/24 showed slightly decreased pancreatic mass, and a radiation changes to her lung lesion.  However she has multiple other new pulmonary nodules on the CT. -Will discuss with her radiation oncologist Dr. Eloise Hake about her pancreatic cancer consolidation radiation

## 2024-04-01 NOTE — Progress Notes (Signed)
 Tiffany [name]   Telephone:([phone]) [phone] Fax:([phone]) [phone]   Clinic Follow up Note   Patient Care Team: Tiffany Colla, MD as PCP - General (Family Medicine) Tiffany Brunt, MD as Consulting Physician (Pulmonary Disease) Tiffany [name], OD as Referring Physician (Optometry) Anderson, Dena D, PA-C (Physician Assistant) Tiffany Findlay, MD as Consulting Physician (Hematology and Oncology) Tiffany Deis, NP as Nurse Practitioner (Hematology and Oncology)  Date of Service:  04/01/2024  CHIEF COMPLAINT: f/u of pancreatic cancer  CURRENT THERAPY:  Pending radiation  Oncology History   Pancreatic cancer (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  -PET scan from June 10, 2023 showed a hypermetabolic 3.1 cm mass in the right upper lobe, and hypermetabolic soft tissue fullness in the pancreatic head, and partially solid and cystic 12.4 cm pelvic mass. 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 -She underwent surgical resection of the pelvic mass, which reviewed recurrent granulosa cell tumor, s/p resection by GYN Tiffany [name]  -baseline CA19.9 33 -Pt has seen by pancreatobiliary surgeon Tiffany [name], who recommends neoadjuvant chemotherapy due to the uncertainty of lung metastasis.  Patient declined surgery. -Repeated CT on November 01, 2023 showed improved treated lung mass, but several new or enlarging lung nodules, concerning for metastatic disease.  -Patient is agreeable with chemo, she started gemcitabine  and Abraxane  on 12/04/2023 -CT 03/05/24 showed slightly decreased pancreatic mass, and a radiation changes to her lung lesion.  However she has multiple other new pulmonary nodules on the CT. -Will discuss with her radiation oncologist Tiffany [name] about her pancreatic cancer consolidation radiation  Assessment & Plan Pancreatic cancer Pancreatic cancer with a favorable response to  chemotherapy, indicated by tumor size reduction. Chemotherapy is non-curative, and surgery is contraindicated due to high risk. Radiation is considered for consolidation to eliminate residual cancer cells and provide a chemotherapy hiatus. Plan includes 3-4 months of chemotherapy followed by pancreatic radiation for disease management, not cure. - Administer 3-4 months of chemotherapy followed by radiation therapy to the pancreas as a consolidation tool  Lung cancer and the new lung nodules Previous lung cancer with radiation treatment. Recent CT reveals new small spots in the right lung, with differential diagnosis including cancer recurrence or metastasis from pancreatic cancer versus post-COVID changes. Biopsy is unfeasible due to spot size. Radiation is considered if spots enlarge, but multiple lung radiations are avoided due to potential impact on lung function from existing lung disease. - Consult with Tiffany [name] to review CT scan and determine next steps - Consider radiation if spots enlarge, avoiding multiple lung radiations due to existing lung disease  Shortness of breath Increased dyspnea despite stable oxygen  requirement at 2 L/min, likely multifactorial from lung disease, recent chemotherapy, and post-COVID effects. Reports significant fatigue and difficulty with physical activity. - Encourage gentle chair exercises to enhance lung capacity and reduce fatigue - Monitor and adjust oxygen  levels as needed  Fatigue Persistent fatigue likely due to recent chemotherapy and decreased appetite, impacting physical activity. Appetite stimulation is considered to improve nutritional intake and energy levels. - Encourage increased nutritional intake, including two Ensure drinks per day - Prescribe mirtazapine 7.5 mg at night for appetite stimulation and sleep improvement - Encourage gentle chair exercises to boost energy levels  Plan - I personally reviewed her recent restaging CT scan images  and discussed the findings with patient and her son - Due to her significant fatigue  and dyspnea, will stop chemotherapy  - She has a follow-up with her radiation oncologist Tiffany [name] to discuss consolidation radiation to her pancreatic cancer - Lab and follow-up in 1 months - Will follow-up her lung nodules with repeated CT scan in 2 to 3 months  Addendum Case was reviewed in thoracic tumor board this morning.  Dr. Alleen [name] and Dr. Marguerita [name] were in the conference.  Her new bilateral pulmonary nodules are concerning for malignancy, will recommend repeating bronchoscopy and a biopsy.  I spoke with patient, she is agreeable.  Referral was made to Tiffany [name].   Tiffany [name]  04/02/2024    SUMMARY OF ONCOLOGIC HISTORY: Oncology History  Adenocarcinoma of upper lobe of right lung (HCC)  08/06/2023 Initial Diagnosis   Adenocarcinoma of upper lobe of right lung (HCC)   08/06/2023 Cancer Staging   Staging form: Lung, AJCC 8th Edition - Clinical: Stage IB (cT2a, cN0, cM0) - Signed by Marlene Simas, MD on 08/06/2023   10/14/2023 Genetic Testing   Negative genetic testing on the CancerNext-Expanded+RNAinsight.  The report date is 10/13/2023.  The CancerNext-Expanded gene panel offered by [employer] and includes sequencing and rearrangement analysis for the following 71 genes: AIP, ALK, APC, ATM, BAP1, BARD1, BMPR1A, BRCA1, BRCA2, BRIP1, CDC73, CDH1, CDK4, CDKN1B, CDKN2A, CHEK2, DICER1, FH, FLCN, KIF1B, LZTR1, MAX, MEN1, MET, MLH1, MSH2, MSH6, MUTYH, NF1, NF2, NTHL1, PALB2, PHOX2B, PMS2, POT1, PRKAR1A, PTCH1, PTEN, RAD51C, RAD51D, RB1, RET, SDHA, SDHAF2, SDHB, SDHC, SDHD, SMAD4, SMARCA4, SMARCB1, SMARCE1, STK11, SUFU, TMEM127, TP53, TSC1, TSC2 and VHL (sequencing and deletion/duplication); AXIN2, CTNNA1, EGFR, EGLN1, HOXB13, KIT, MITF, MSH3, PDGFRA, POLD1 and POLE (sequencing only); EPCAM and GREM1 (deletion/duplication only). RNA data is routinely analyzed for use in variant interpretation for all genes.     Pancreatic cancer (HCC)  10/07/2023 Cancer Staging   Staging form: Exocrine Pancreas, AJCC 8th Edition - Clinical stage from 10/07/2023: Stage IB (cT2, cN0, cM0) - Signed by Tiffany [place], MD on 10/25/2023 Total positive nodes: 0   10/25/2023 Initial Diagnosis   Pancreatic cancer (HCC)   12/04/2023 -  Chemotherapy   [name] : PANCREATIC Abraxane  D1,8,15 + Gemcitabine  D1,8,15 q28d     03/05/2024 Imaging   CT CAP with contrast IMPRESSION: 1. Slightly diminished size of a hypodense mass of the pancreatic head, consistent with treatment response. No significant change in diffuse pancreatic ductal dilatation and atrophy of the remaining pancreatic parenchyma. 2. Subsolid lung lesion of the peripheral right upper lobe near a fiducial marker is essentially indistinguishable from background on today's examination with new subpleural consolidation consistent with developing radiation fibrosis. Findings consistent with treatment response of the lesion to local radiation therapy. 3. Multiple additional subsolid and predominantly solid nodular opacities, predominantly in the periphery of the right lung, several of which are increased in size and solid character, appearance and behavior most consistent with worsened multifocal pulmonary adenocarcinoma although pulmonary metastases not generally excluded in the setting of pancreatic adenocarcinoma. 4. Interval enlargement of a cystic lesion with some peripheral soft tissue in the left lower quadrant, of uncertain nature. This is possibly an unusual cystic metastasis or possibly of ovarian origin if there has been ovarian transposition. 5. Severe emphysema.        Discussed the use of AI scribe software for clinical note transcription with the patient, who gave verbal consent to proceed.  History of Present Illness Tiffany [name] "Tiffany [name]" is a 78 year old female with pancreatic cancer who presents for follow-up of her  condition.  She  experiences significant shortness of breath and difficulty breathing, requiring 2 liters of oxygen . She struggles to walk short distances without becoming breathless, even with oxygen  support. This increased shortness of breath and fatigue began around the third chemotherapy session, which was complicated by a COVID-19 infection in March.  She has been undergoing chemotherapy for pancreatic cancer, starting on January 8th, with five doses administered every two weeks until March, followed by a break due to COVID-19. After recovery, she received two more doses in April. A recent scan showed the pancreatic cancer has reduced in size, but there are new spots in the lung, which may be related to either cancer or previous COVID-19 infection.  She has a history of lung cancer treated with radiation last year. Her appetite is poor, contributing to her weakness, and she experiences nausea for which she takes medication. She drinks one Ensure daily and is considering increasing to two to improve her nutritional intake.     All other systems were reviewed with the patient and are negative.  MEDICAL HISTORY:  Past Medical History:  Diagnosis Date   Abnormal PFT 09/02/2014   moderate restrictive ventilatory defect.   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 Fleurette Humbles   Colon polyp 12/2003   Diverticulosis 11/2010   seen on colonoscopy and air contrast BE   Emphysema of lung (HCC)    Family history of breast cancer    GERD (gastroesophageal reflux disease)    History of radiation therapy    Right Lung-09/04/23-09/09/23- Dr. Retta Caster   Hyperlipidemia    Hypertension    previously treated with meds, resolved   MGUS (monoclonal gammopathy of unknown significance) 1999   prevously under care of Dr. 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   Tobacco abuse    quit in 2019    SURGICAL HISTORY: Past Surgical History:  Procedure Laterality Date   BASAL CELL CARCINOMA EXCISION  11/26/2008   L face, R temple   BILATERAL SALPINGOOPHORECTOMY  11/26/2005   cancerous tumor R ovary   BIOPSY  10/07/2023   Procedure: BIOPSY;  Surgeon: Normie Becton., MD;  Location: [facility] ENDOSCOPY;  Service: Gastroenterology;;   BRONCHIAL BIOPSY  07/09/2023   Procedure: BRONCHIAL BIOPSIES;  Surgeon: Denson Flake, MD;  Location: [name] ENDOSCOPY;  Service: Pulmonary;;   BRONCHIAL BRUSHINGS  07/09/2023   Procedure: BRONCHIAL BRUSHINGS;  Surgeon: Denson Flake, MD;  Location: [facility] ENDOSCOPY;  Service: Pulmonary;;   BRONCHIAL NEEDLE ASPIRATION BIOPSY  07/09/2023   Procedure: BRONCHIAL NEEDLE ASPIRATION BIOPSIES;  Surgeon: Denson Flake, MD;  Location: [facility] ENDOSCOPY;  Service: Pulmonary;;   CATARACT EXTRACTION  11/27/2007   bilateral   COLONOSCOPY  11/26/2010   Dr. Lincoln Renshaw. Diverticulosis. BE also done--normal   ESOPHAGOGASTRODUODENOSCOPY (EGD) WITH PROPOFOL  N/A 10/07/2023   Procedure: ESOPHAGOGASTRODUODENOSCOPY (EGD) WITH PROPOFOL ;  Surgeon: Brice Campi Albino Alu., MD;  Location: [facility] ENDOSCOPY;  Service: Gastroenterology;  Laterality: N/A;   EUS N/A 10/07/2023   Procedure: UPPER ENDOSCOPIC  ULTRASOUND (EUS) RADIAL;  Surgeon: Brice Campi Albino Alu., MD;  Location: [facility] ENDOSCOPY;  Service: Gastroenterology;  Laterality: N/A;   FIDUCIAL MARKER PLACEMENT  07/09/2023   Procedure: FIDUCIAL MARKER PLACEMENT;  Surgeon: Denson Flake, MD;  Location: [facility] ENDOSCOPY;  Service: Pulmonary;;   FINE NEEDLE ASPIRATION N/A 10/07/2023   Procedure: FINE NEEDLE ASPIRATION (FNA) LINEAR;  Surgeon: Normie Becton., MD;  Location: [facility] ENDOSCOPY;  Service: Gastroenterology;  Laterality: N/A;   OMENTECTOMY  11/26/2005   and pelvic lymphadenectomy (with BSO)   PORTACATH PLACEMENT N/A 11/22/2023   Procedure: INSERTION PORT-A-CATH;  Surgeon: Lockie Rima, MD;  Location: [facility] OR;  Service: General;  Laterality: N/A;   ROBOTIC ASSISTED LAPAROSCOPIC LYSIS OF ADHESION N/A 07/18/2023   Procedure: ROBOTIC ASSISTED PELVIC MASS EXCISION WITH RADICAL DISSECTION, MINI LAPAROTOMY, CYSTOSCOPY;  Surgeon: Suzi Essex, MD;  Location: [facility] ORS;  Service: Gynecology;  Laterality: N/A;   SKIN CANCER EXCISION Left 07/2021   neck; pt can't recall type of cancer   TONSILLECTOMY  age 89   VAGINAL HYSTERECTOMY  late 20's   "didn't want any more children"    I have reviewed the social history and family history with the patient and they are unchanged from previous note.  ALLERGIES:  has no known allergies.  MEDICATIONS:  Current Outpatient Medications  Medication Sig Dispense Refill   acetaminophen  (TYLENOL ) 500 MG tablet Take 2 tablets (1,000 mg total) by mouth every 8 (eight) hours as needed for mild pain (pain score 1-3). May use Tylenol  for next 1-3 days if needed for abdominal pain, but otherwise call office if any issues arise (Patient taking differently: Take 1,000 mg by mouth every 8 (eight) hours as needed for mild pain (pain score 1-3).)     albuterol  (VENTOLIN  HFA) 108 (90 Base) MCG/ACT inhaler Inhale 2 puffs into the lungs every 4 (four) hours as needed for wheezing or shortness of breath. 6.7 g 0   albuterol  (VENTOLIN  HFA) 108 (90 Base) MCG/ACT inhaler INHALE 2 PUFFS BY MOUTH EVERY 6 HOURS AS NEEDED FOR WHEEZING FOR SHORTNESS OF BREATH . APPOINTMENT REQUIRED FOR FUTURE REFILLS (Patient taking differently: Inhale 2 puffs into the lungs every 6 (six) hours as needed for wheezing or shortness of breath.) 9 g 3   cholecalciferol (VITAMIN D3) 25 MCG (1000 UNIT) tablet Take 1,000 Units by mouth daily.     Coenzyme Q10  (COQ10) 100 MG CAPS Take 100 mg by mouth at bedtime.     lidocaine -prilocaine  (EMLA ) cream Apply to affected area once (Patient taking differently: Apply 1 Application topically as needed (for port access).) 30 g 3   losartan  (COZAAR ) 50 MG tablet Take 1 tablet (50 mg total) by mouth daily. 90 tablet 3   magic mouthwash (nystatin , lidocaine , diphenhydrAMINE, alum & mag hydroxide) suspension Take 5 mLs by mouth 4 (four) times daily as needed for mouth pain. 400 mL 0   mirtazapine (REMERON) 7.5 MG tablet Take 1 tablet (7.5 mg total) by mouth at bedtime. 30 tablet 0   ondansetron  (ZOFRAN ) 8 MG tablet Take 1 tablet (8 mg total) by mouth every 8 (eight) hours as needed for nausea or vomiting. 30 tablet 1   predniSONE  (DELTASONE ) 10 MG tablet Prednisone  40 mg po daily x  2 day then Prednisone  30 mg po daily x 2 day then Prednisone  20 mg po daily x 2 day then Prednisone  10 mg daily x 2 day then stop... 20 tablet 0   prochlorperazine  (COMPAZINE ) 10 MG tablet Take 1  tablet (10 mg total) by mouth every 6 (six) hours as needed for nausea or vomiting. 30 tablet 1   Tiotropium Bromide -Olodaterol (STIOLTO RESPIMAT ) 2.5-2.5 MCG/ACT AERS INHALE 2 PUFFS BY MOUTH ONCE DAILY (Patient taking differently: Inhale 2 puffs into the lungs in the morning.) 4 g 11   rosuvastatin  (CRESTOR ) 10 MG tablet Take 1 tablet (10 mg total) by mouth daily. (Patient taking differently: Take 10 mg by mouth at bedtime.) 90 tablet 3   No current facility-administered medications for this visit.    PHYSICAL EXAMINATION: ECOG PERFORMANCE STATUS: 2 - Symptomatic, <50% confined to bed  Vitals:   04/01/24 1316  BP: 127/67  Pulse: 94  Resp: 20  Temp: 97.7 F (36.5 C)  SpO2: 94%   Wt Readings from Last 3 Encounters:  04/01/24 114 lb 1.6 oz (51.8 kg)  03/20/24 117 lb 12.8 oz (53.4 kg)  03/11/24 117 lb 12.8 oz (53.4 kg)     GENERAL:alert, no distress and comfortable SKIN: skin color, texture, turgor are normal, no rashes or  significant lesions EYES: normal, Conjunctiva are pink and non-injected, sclera clear NECK: supple, thyroid  normal size, non-tender, without nodularity LYMPH:  no palpable lymphadenopathy in the cervical, axillary  LUNGS: clear to auscultation and percussion with normal breathing effort HEART: regular rate & rhythm and no murmurs and no lower extremity edema ABDOMEN:abdomen soft, non-tender and normal bowel sounds Musculoskeletal:no cyanosis of digits and no clubbing  NEURO: alert & oriented x 3 with fluent speech, no focal motor/sensory deficits  Physical Exam    LABORATORY DATA:  I have reviewed the data as listed    Latest Ref Rng & Units 04/01/2024   12:41 PM 03/11/2024   10:26 AM 02/26/2024    1:59 PM  CBC  WBC 4.0 - 10.5 K/uL 5.9  3.7  8.1   Hemoglobin 12.0 - 15.0 g/dL 98.1  19.1  47.8   Hematocrit 36.0 - 46.0 % 34.4  34.8  38.9   Platelets 150 - 400 K/uL 284  251  175         Latest Ref Rng & Units 04/01/2024   12:41 PM 03/11/2024   10:26 AM 02/26/2024    1:59 PM  CMP  Glucose 70 - 99 mg/dL 295  621  308   BUN 8 - 23 mg/dL 11  11  18    Creatinine 0.44 - 1.00 mg/dL 6.57  8.46  9.62   Sodium 135 - 145 mmol/L 136  134  133   Potassium 3.5 - 5.1 mmol/L 3.8  4.1  3.9   Chloride 98 - 111 mmol/L 95  96  95   CO2 22 - 32 mmol/L 35  33  29   Calcium  8.9 - 10.3 mg/dL 9.3  9.8  9.2   Total Protein 6.5 - 8.1 g/dL 7.3  7.1  7.2   Total Bilirubin 0.0 - 1.2 mg/dL 0.5  0.5  1.1   Alkaline Phos 38 - 126 U/L 100  92  85   AST 15 - 41 U/L 24  32  19   ALT 0 - 44 U/L 17  31  18        RADIOGRAPHIC STUDIES: I have personally reviewed the radiological images as listed and agreed with the findings in the report. No results found.    No orders of the defined types were placed in this encounter.  All questions were answered. The patient knows to call the clinic with any problems, questions or concerns. No barriers to  learning was detected. The total time spent in the appointment was 25  minutes, including review of chart and various tests results, discussions about plan of care and coordination of care plan     Tiffany [place], MD 04/01/2024

## 2024-04-02 ENCOUNTER — Other Ambulatory Visit: Payer: Self-pay

## 2024-04-02 ENCOUNTER — Telehealth: Payer: Self-pay | Admitting: Emergency Medicine

## 2024-04-02 ENCOUNTER — Encounter: Payer: Self-pay | Admitting: Emergency Medicine

## 2024-04-02 DIAGNOSIS — R918 Other nonspecific abnormal finding of lung field: Secondary | ICD-10-CM | POA: Insufficient documentation

## 2024-04-02 NOTE — Telephone Encounter (Signed)
 I spoke with the patient to review CT and arrange for biopsies.  We will plan for robotic assisted navigational bronchoscopy, hopefully on 04/07/2024.  She understands that she will need a designated driver and someone to watch her at home that day after the procedure.  Risks and benefits discussed

## 2024-04-02 NOTE — Telephone Encounter (Signed)
-----   Message from Tiffany [name] sent at 04/02/2024  8:02 AM EDT ----- Dr. Baldwin Levee  I spoke with pt and she is open to bronchoscopy and biopsy, please get her in, thanks   Tiffany [place]

## 2024-04-02 NOTE — Telephone Encounter (Signed)
 Patient was discussed in thoracic conference on 04/02/2024, has multiple mixed density pulmonary nodules concerning for possible adenocarcinoma.  Should be willing to consider bronchoscopy we will work on arranging

## 2024-04-03 ENCOUNTER — Encounter (HOSPITAL_COMMUNITY): Payer: Self-pay | Admitting: Emergency Medicine

## 2024-04-03 ENCOUNTER — Other Ambulatory Visit: Payer: Self-pay

## 2024-04-03 ENCOUNTER — Encounter: Payer: Self-pay | Admitting: Hematology

## 2024-04-03 NOTE — Progress Notes (Signed)
 SDW call  Patient was given pre-op instructions over the phone. Patient verbalized understanding of instructions provided. Patient was diagnosed with Covid 02/12/2024 and admitted to [facility] until 02/15/2024. States she doesn't feel like she had any symptoms other than being SOB.  Patient is currently on home oxygen  2 1/2 L/[place] continuous.      PCP - Dr. [name] Cardiologist - Dr. Veryl Gottron End Pulmonary:    PPM/ICD - denies Device Orders - na Rep Notified - na   Chest x-ray - 02/12/2024 EKG -  02/12/2024 Stress Test - ECHO - 01/31/2022 Cardiac Cath -   Sleep Study/sleep apnea/CPAP: denies  Non-diabetic  Blood Thinner Instructions: denies Aspirin  Instructions:denies   ERAS Protcol - NPO  COVID TEST- Positive covid 02/12/2024   Anesthesia review: Yes. HTN, COPD, emphysema, admit 02/12/2024   Patient denies fever, cough and chest pain over the phone call  Your procedure is scheduled on Tuesday Apr 07, 2024  Report to [facility] Main Entrance "A" at  0645  A.M., then check in with the Admitting office.  Call this number if you have problems the morning of surgery:  [phone]   If you have any questions prior to your surgery date call [phone]: Open Monday-Friday 8am-4pm If you experience any cold or flu symptoms such as cough, fever, chills, shortness of breath, etc. between now and your scheduled surgery, please notify us  at the above number    Remember:  Do not eat or drink after midnight the night before your surgery  Take these medicines the morning of surgery with A SIP OF WATER :  Stiolto inhaler  As needed: Tylenol , albuterol , zofran , compazine   As of today, STOP taking any Aspirin  (unless otherwise instructed by your surgeon) Aleve, Naproxen, Ibuprofen, Motrin, Advil, Goody's, BC's, all herbal medications, fish oil, and all vitamins.

## 2024-04-03 NOTE — Progress Notes (Signed)
 The proposed treatment discussed in conference is for discussion purpose only and is not a binding recommendation.  The patients have not been physically examined, or presented with their treatment options.  Therefore, final treatment plans cannot be decided.

## 2024-04-06 NOTE — Anesthesia Preprocedure Evaluation (Signed)
 Anesthesia Evaluation  Patient identified by MRN, date of birth, ID band Patient awake    Reviewed: Allergy & Precautions, H&P , NPO status , Patient's Chart, lab work & pertinent test results  Airway Mallampati: III  TM Distance: >3 FB Neck ROM: Full    Dental  (+) Edentulous Upper, Edentulous Lower   Pulmonary COPD (2.5 LPM),  COPD inhaler and oxygen  dependent, former smoker B/L pulmonary nodules 120 pack year history    Pulmonary exam normal breath sounds clear to auscultation       Cardiovascular hypertension (143/63 preop), Pt. on medications + Peripheral Vascular Disease  Normal cardiovascular exam Rhythm:Regular Rate:Normal     Neuro/Psych negative neurological ROS  negative psych ROS   GI/Hepatic Neg liver ROS,GERD  Controlled,,  Endo/Other  negative endocrine ROS    Renal/GU negative Renal ROS  negative genitourinary   Musculoskeletal  (+) Arthritis , Osteoarthritis,    Abdominal   Peds negative pediatric ROS (+)  Hematology Hb 12.1, plt 284 MGUS    Anesthesia Other Findings   Reproductive/Obstetrics negative OB ROS                             Anesthesia Physical Anesthesia Plan  ASA: 4  Anesthesia Plan: General   Post-op Pain Management: Tylenol  PO (pre-op)*   Induction: Intravenous  PONV Risk Score and Plan: 3 and Ondansetron , Dexamethasone , Midazolam  and Treatment may vary due to age or medical condition  Airway Management Planned: Oral ETT  Additional Equipment: None  Intra-op Plan:   Post-operative Plan: Extubation in OR  Informed Consent: I have reviewed the patients History and Physical, chart, labs and discussed the procedure including the risks, benefits and alternatives for the proposed anesthesia with the patient or authorized representative who has indicated his/her understanding and acceptance.     Dental advisory given  Plan Discussed with:  CRNA  Anesthesia Plan Comments:        Anesthesia Quick Evaluation

## 2024-04-06 NOTE — Progress Notes (Signed)
 Anesthesia Chart Review: SAME DAY WORK-UP  Case: [id number] Date/Time: 04/07/24 0915   Procedure: BRONCHOSCOPY, WITH BIOPSY USING ELECTROMAGNETIC NAVIGATION (Bilateral) - WITH FLUORO   Anesthesia type: General   Diagnosis: Pulmonary nodules [R91.8]   Pre-op diagnosis: Bilateral pulmonary nodules   [facility] / [facility]   Surgeons: Denson Flake, MD       DISCUSSION: Patient is a 78 year old [name] scheduled for the above procedure. 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 Recent imaging revealed new bilateral pulmonary nodules. Case was reviewed by thoracic tumor board conference and repeat bronchoscopy with biopsies recommended.    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  Minimal CAD by 01/2022 CCTA.   Admission 02/12/24 - 02/15/24 for acute hypoxic respiratory failure due to COVID-19 infection and COPD exacerbation, discharged on 2L O2.                  Last labs noted are from 04/01/24. Anesthesia team to evaluate on the day of surgery.    VS:  Wt Readings from Last 3 Encounters:  04/01/24 51.8 kg  03/20/24 53.4 kg  03/11/24 53.4 kg   BP Readings from Last 3 Encounters:  04/01/24 127/67  03/20/24 119/60  03/11/24 130/80   Pulse Readings from Last 3 Encounters:  04/01/24 94  03/20/24 88  03/11/24 91     PROVIDERS: [name], MD is PCP  Sonja Wood, MD is HEM-ONC. She sees Marlene Simas, MD for lung cancer.   Retta Caster, MD is RAD-ONC Jackquelyn Mass, MD is cardiologist. Evaluation for chest pain in early 2023. See CV below for test results. Racheal Buddle, MD & Quillian Brunt, MD are her pulmonologists Wiley Hanger, MD is Mignon Alberts, MD is GI    LABS: For day of surgery as indicated. Last results in [facility] include: Lab Results  Component Value Date   WBC 5.9 04/01/2024   HGB 12.1 04/01/2024   HCT 34.4 (L) 04/01/2024   PLT 284 04/01/2024   GLUCOSE 108 (H) 04/01/2024   ALT Tiffany 04/01/2024   AST 24 04/01/2024   NA 136 04/01/2024   K 3.8 04/01/2024   CL 95 (L) 04/01/2024   CREATININE 0.54 04/01/2024   BUN 11 04/01/2024   CO2 35 (H) 04/01/2024   HGBA1C 6.4 (A) 11/21/2023     IMAGES: CT Chest/abd/pelvis 03/05/24: IMPRESSION: 1. Slightly diminished size of a hypodense mass of the pancreatic head, consistent with treatment response. No significant change in diffuse pancreatic ductal dilatation and atrophy of the remaining pancreatic parenchyma. 2. Subsolid lung lesion of the peripheral right upper lobe near a fiducial marker is essentially indistinguishable from background on today's examination with new subpleural consolidation consistent with developing radiation fibrosis. Findings consistent with treatment response of the lesion to local radiation therapy. 3. Multiple additional subsolid and predominantly solid nodular opacities, predominantly in the periphery of the right lung, several of which are increased in size and solid character, appearance and behavior most consistent with worsened multifocal pulmonary adenocarcinoma although pulmonary metastases not generally excluded in the setting of  pancreatic adenocarcinoma. 4. Interval enlargement of a cystic lesion with some peripheral soft tissue in the left lower quadrant, of uncertain nature. This is possibly an unusual cystic metastasis or possibly of ovarian origin if there has been ovarian  transposition. 5. Severe emphysema.  - Aortic Atherosclerosis (ICD10-I70.0) and Emphysema (ICD10-J43.9).   PET Scan 06/10/23: IMPRESSION: 1. Mixed attenuation right upper lobe lung mass is hypermetabolic, most consistent with primary bronchogenic carcinoma. 2. A partially solid, partially cystic 12.4 cm anterior pelvic mass most likely arises from an ovary and is suspicious for mucinous carcinoma. Consider gynecological consultation. 3. Hypermetabolism corresponding to soft tissue fullness about the pancreatic head and descending duodenum. Considerations include pancreatic head carcinoma or focal pancreatitis. Consider further evaluation with pancreatic protocol pre and post contrast abdominal CT or MRI. 4. Incidental findings, including: Aortic atherosclerosis (ICD10-I70.0), coronary artery atherosclerosis and emphysema (ICD10-J43.9). Sinus disease.   EKG:  EKG 02/12/24: Sinus rhythm LAE, consider biatrial enlargement Left ventricular hypertrophy Probable anterior infarct, age indeterminate when comapored top rior, similar appearnce No STEMI Confirmed by Wynell Heath ([id number]) on 02/12/2024 5:23:42 PM    CV: Echo 01/31/22: IMPRESSIONS   1. Left ventricular ejection fraction, by estimation, is 60 to 65%. The  left ventricle has normal function. The left ventricle has no regional  wall motion abnormalities. Left ventricular diastolic parameters were  normal.   2. Right ventricular systolic function is normal. The right ventricular  size is normal.   3. The mitral valve is normal in structure. Mild mitral valve  regurgitation. No evidence of mitral stenosis. There is mild late systolic  prolapse of the anterior leaflet of the mitral valve.   4. The aortic valve is tricuspid. Aortic valve regurgitation is not  visualized. No aortic stenosis is present.   5. The inferior vena cava is normal in size with greater than 50%  respiratory variability, suggesting right atrial pressure  of 3 mmHg.  - Comparison(s): Prior images unable to be directly viewed, comparison made  by report only.      CT Coronary 01/31/22: IMPRESSION: 1. Coronary calcium  score of 46. This was 51st percentile for age and sex matched control. 2. Normal coronary origin with right dominance. 3. Nonobstructive CAD, with calcified plaque causing minimal (0-24%) stenosis in proximal/mid RCA, proximal LAD, and proximal LCX CAD-RADS 1. Minimal non-obstructive CAD (0-24%). Consider non-atherosclerotic causes of chest pain. Consider preventive therapy and risk factor modification.     US  Carotid 06/30/19: IMPRESSION: Color duplex indicates minimal heterogeneous and calcified plaque, with no hemodynamically significant stenosis by duplex criteria in the extracranial cerebrovascular circulation.    Past Medical History:  Diagnosis Date   Abnormal PFT 09/02/2014   moderate restrictive ventilatory defect.   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 Fleurette Humbles   Colon polyp 12/2003   Diverticulosis 11/2010   seen on colonoscopy and air contrast BE   Emphysema of lung (HCC)    Family history of breast cancer    GERD (gastroesophageal reflux disease)    History of radiation therapy    Right Lung-09/04/23-09/09/23- Dr. Retta Caster   Hyperlipidemia    Hypertension    previously treated with meds, resolved   MGUS (monoclonal gammopathy of unknown significance) 1999   prevously under care of Dr. Sharalyn Dasen   On home oxygen  therapy    2 1/2L/[place]   Osteopenia  DEXA 01/31/2010 at 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   Tobacco abuse    quit in 2019    Past  Surgical History:  Procedure Laterality Date   BASAL CELL CARCINOMA EXCISION  11/26/2008   L face, R temple   BILATERAL SALPINGOOPHORECTOMY  11/26/2005   cancerous tumor R ovary   BIOPSY  10/07/2023   Procedure: BIOPSY;  Surgeon: Normie Becton., MD;  Location: [facility] ENDOSCOPY;  Service: Gastroenterology;;   BRONCHIAL BIOPSY  07/09/2023   Procedure: BRONCHIAL BIOPSIES;  Surgeon: Denson Flake, MD;  Location[facility] ENDOSCOPY;  Service: Pulmonary;;   BRONCHIAL BRUSHINGS  07/09/2023   Procedure: BRONCHIAL BRUSHINGS;  Surgeon: Denson Flake, MD;  Location: [facility] ENDOSCOPY;  Service: Pulmonary;;   BRONCHIAL NEEDLE ASPIRATION BIOPSY  07/09/2023   Procedure: BRONCHIAL NEEDLE ASPIRATION BIOPSIES;  Surgeon: Denson Flake, MD;  Location: [facility] ENDOSCOPY;  Service: Pulmonary;;   CATARACT EXTRACTION  11/27/2007   bilateral   COLONOSCOPY  11/26/2010   Dr. Lincoln Renshaw. Diverticulosis. BE also done--normal   ESOPHAGOGASTRODUODENOSCOPY (EGD) WITH PROPOFOL  N/A 10/07/2023   Procedure: ESOPHAGOGASTRODUODENOSCOPY (EGD) WITH PROPOFOL ;  Surgeon: Brice Campi Albino Alu., MD;  Location: [facility] ENDOSCOPY;  Service: Gastroenterology;  Laterality: N/A;   EUS N/A 10/07/2023   Procedure: UPPER ENDOSCOPIC ULTRASOUND (EUS) RADIAL;  Surgeon: Normie Becton., MD;  Location: [facility] ENDOSCOPY;  Service: Gastroenterology;  Laterality: N/A;   FIDUCIAL MARKER PLACEMENT  07/09/2023   Procedure: FIDUCIAL MARKER PLACEMENT;  Surgeon: Denson Flake, MD;  Location[facility] END[facility]COPY;  Service: Pulmonary;;   FINE NEEDLE ASPIRATION N/A 10/07/2023   Procedure: FINE NEEDLE ASPIRATION (FNA) LINEAR;  Surgeon: Normie Becton., MD;  Location: [facility] ENDOSCOPY;  Service: Gastroenterology;  Laterality: N/A;   OMENTECTOMY  11/26/2005   and pelvic lymphadenectomy (with BSO)   PORTACATH PLACEMENT N/A 11/22/2023   Procedure: INSERTION PORT-A-CATH;  Surgeon: Lockie Rima, MD;  Location: [facility] OR;  Service: General;  Laterality: N/A;   ROBOTIC ASSISTED  LAPAROSCOPIC LYSIS OF ADHESION N/A 07/18/2023   Procedure: ROBOTIC ASSISTED PELVIC MASS EXCISION WITH RADICAL DISSECTION, MINI LAPAROTOMY, CYSTOSCOPY;  Surgeon: Suzi Essex, MD;  Location: [facility] ORS;  Service: Gynecology;  Laterality: N/A;   SKIN CANCER EXCISION Left 07/2021   neck; pt can't recall type of cancer   TONSILLECTOMY  age 73   VAGINAL HYSTERECTOMY  late 20's   "didn't want any more children"    MEDICATIONS: No current facility-administered medications for this encounter.    acetaminophen  (TYLENOL ) 500 MG tablet   albuterol  (VENTOLIN  HFA) 108 (90 Base) MCG/ACT inhaler   albuterol  (VENTOLIN  HFA) 108 (90 Base) MCG/ACT inhaler   cholecalciferol (VITAMIN D3) 25 MCG (1000 UNIT) tablet   Coenzyme Q10 (COQ10) 100 MG CAPS   lidocaine -prilocaine  (EMLA ) cream   losartan  (COZAAR ) 50 MG tablet   magic mouthwash (nystatin , lidocaine , diphenhydrAMINE, alum & mag hydroxide) suspension   mirtazapine  (REMERON ) 7.5 MG tablet   ondansetron  (ZOFRAN ) 8 MG tablet   predniSONE  (DELTASONE ) 10 MG tablet   prochlorperazine  (COMPAZINE ) 10 MG tablet   rosuvastatin  (CRESTOR ) 10 MG tablet   Tiotropium Bromide -Olodaterol (STIOLTO RESPIMAT ) 2.5-2.5 MCG/ACT AERS     Ella Gun, PA-C Surgical Short Stay/Anesthesiology [facility] Phone [phone] [facility] Phone [phone] 04/06/2024 10:44 AM

## 2024-04-07 ENCOUNTER — Ambulatory Visit (HOSPITAL_COMMUNITY)
Admission: RE | Admit: 2024-04-07 | Discharge: 2024-04-07 | Disposition: A | Discharge status: Home / Self Care | Attending: Emergency Medicine | Admitting: Emergency Medicine

## 2024-04-07 ENCOUNTER — Other Ambulatory Visit: Payer: Self-pay

## 2024-04-07 ENCOUNTER — Ambulatory Visit (HOSPITAL_COMMUNITY)

## 2024-04-07 ENCOUNTER — Encounter (HOSPITAL_COMMUNITY): Payer: Self-pay | Admitting: Emergency Medicine

## 2024-04-07 ENCOUNTER — Ambulatory Visit (HOSPITAL_BASED_OUTPATIENT_CLINIC_OR_DEPARTMENT_OTHER): Payer: Self-pay | Admitting: Vascular Surgery

## 2024-04-07 ENCOUNTER — Ambulatory Visit (HOSPITAL_COMMUNITY): Payer: Self-pay | Admitting: Vascular Surgery

## 2024-04-07 ENCOUNTER — Encounter (HOSPITAL_COMMUNITY)
Admission: RE | Disposition: A | Discharge status: Home / Self Care | Payer: Self-pay | Source: Home / Self Care | Attending: Emergency Medicine

## 2024-04-07 DIAGNOSIS — Z8543 Personal history of malignant neoplasm of ovary: Secondary | ICD-10-CM | POA: Diagnosis not present

## 2024-04-07 DIAGNOSIS — I10 Essential (primary) hypertension: Secondary | ICD-10-CM | POA: Diagnosis not present

## 2024-04-07 DIAGNOSIS — C3432 Malignant neoplasm of lower lobe, left bronchus or lung: Secondary | ICD-10-CM | POA: Insufficient documentation

## 2024-04-07 DIAGNOSIS — Z85828 Personal history of other malignant neoplasm of skin: Secondary | ICD-10-CM | POA: Insufficient documentation

## 2024-04-07 DIAGNOSIS — Z8507 Personal history of malignant neoplasm of pancreas: Secondary | ICD-10-CM | POA: Insufficient documentation

## 2024-04-07 DIAGNOSIS — Z87891 Personal history of nicotine dependence: Secondary | ICD-10-CM | POA: Diagnosis not present

## 2024-04-07 DIAGNOSIS — R911 Solitary pulmonary nodule: Secondary | ICD-10-CM | POA: Diagnosis not present

## 2024-04-07 DIAGNOSIS — Z9981 Dependence on supplemental oxygen: Secondary | ICD-10-CM | POA: Insufficient documentation

## 2024-04-07 DIAGNOSIS — J439 Emphysema, unspecified: Secondary | ICD-10-CM | POA: Diagnosis not present

## 2024-04-07 DIAGNOSIS — Z9221 Personal history of antineoplastic chemotherapy: Secondary | ICD-10-CM | POA: Insufficient documentation

## 2024-04-07 DIAGNOSIS — R918 Other nonspecific abnormal finding of lung field: Secondary | ICD-10-CM | POA: Insufficient documentation

## 2024-04-07 DIAGNOSIS — E785 Hyperlipidemia, unspecified: Secondary | ICD-10-CM | POA: Diagnosis not present

## 2024-04-07 DIAGNOSIS — Z923 Personal history of irradiation: Secondary | ICD-10-CM | POA: Insufficient documentation

## 2024-04-07 DIAGNOSIS — Z48813 Encounter for surgical aftercare following surgery on the respiratory system: Secondary | ICD-10-CM | POA: Diagnosis not present

## 2024-04-07 DIAGNOSIS — R846 Abnormal cytological findings in specimens from respiratory organs and thorax: Secondary | ICD-10-CM | POA: Diagnosis not present

## 2024-04-07 DIAGNOSIS — C250 Malignant neoplasm of head of pancreas: Secondary | ICD-10-CM

## 2024-04-07 DIAGNOSIS — J984 Other disorders of lung: Secondary | ICD-10-CM | POA: Diagnosis not present

## 2024-04-07 DIAGNOSIS — J449 Chronic obstructive pulmonary disease, unspecified: Secondary | ICD-10-CM

## 2024-04-07 DIAGNOSIS — E7800 Pure hypercholesterolemia, unspecified: Secondary | ICD-10-CM

## 2024-04-07 HISTORY — PX: BRONCHIAL BIOPSY: SHX5109

## 2024-04-07 HISTORY — DX: Endocarditis, valve unspecified: I38

## 2024-04-07 HISTORY — PX: VIDEO BRONCHOSCOPY WITH ENDOBRONCHIAL NAVIGATION: SHX6175

## 2024-04-07 HISTORY — PX: BRONCHIAL BRUSHINGS: SHX5108

## 2024-04-07 HISTORY — PX: BRONCHIAL NEEDLE ASPIRATION BIOPSY: SHX5106

## 2024-04-07 SURGERY — VIDEO BRONCHOSCOPY WITH ENDOBRONCHIAL NAVIGATION
Anesthesia: General | Laterality: Bilateral

## 2024-04-07 MED ORDER — SUGAMMADEX SODIUM 200 MG/2ML IV SOLN
INTRAVENOUS | Status: DC | PRN
  Administered 2024-04-07: 200 mg via INTRAVENOUS

## 2024-04-07 MED ORDER — DEXAMETHASONE SODIUM PHOSPHATE 10 MG/ML IJ SOLN
INTRAMUSCULAR | Status: DC | PRN
  Administered 2024-04-07: 10 mg via INTRAVENOUS

## 2024-04-07 MED ORDER — CHLORHEXIDINE GLUCONATE 0.12 % MT SOLN
OROMUCOSAL | Status: AC
  Administered 2024-04-07: 15 mL via OROMUCOSAL
  Filled 2024-04-07: qty 15

## 2024-04-07 MED ORDER — ROCURONIUM BROMIDE 10 MG/ML (PF) SYRINGE
PREFILLED_SYRINGE | INTRAVENOUS | Status: DC | PRN
  Administered 2024-04-07: 40 mg via INTRAVENOUS

## 2024-04-07 MED ORDER — ONDANSETRON HCL 4 MG/2ML IJ SOLN: 4.0000 mg | Freq: Once | INTRAMUSCULAR | Status: DC | PRN

## 2024-04-07 MED ORDER — PHENYLEPHRINE 80 MCG/ML (10ML) SYRINGE FOR IV PUSH (FOR BLOOD PRESSURE SUPPORT)
PREFILLED_SYRINGE | INTRAVENOUS | Status: DC | PRN
  Administered 2024-04-07: 80 ug via INTRAVENOUS

## 2024-04-07 MED ORDER — PROPOFOL 500 MG/50ML IV EMUL
INTRAVENOUS | Status: DC | PRN
  Administered 2024-04-07: 125 ug/kg/min via INTRAVENOUS

## 2024-04-07 MED ORDER — ONDANSETRON HCL 4 MG/2ML IJ SOLN
INTRAMUSCULAR | Status: DC | PRN
  Administered 2024-04-07: 4 mg via INTRAVENOUS

## 2024-04-07 MED ORDER — LACTATED RINGERS IV SOLN: INTRAVENOUS | Status: DC

## 2024-04-07 MED ORDER — CHLORHEXIDINE GLUCONATE 0.12 % MT SOLN: 15.0000 mL | Freq: Once | OROMUCOSAL | Status: AC

## 2024-04-07 MED ORDER — LIDOCAINE-PRILOCAINE 2.5-2.5 % EX CREA: 1.0000 | TOPICAL_CREAM | CUTANEOUS | Status: AC | PRN

## 2024-04-07 MED ORDER — FENTANYL CITRATE (PF) 250 MCG/5ML IJ SOLN
INTRAMUSCULAR | Status: DC | PRN
  Administered 2024-04-07 (×2): 50 ug via INTRAVENOUS

## 2024-04-07 MED ORDER — ROSUVASTATIN CALCIUM 10 MG PO TABS: 10.0000 mg | ORAL_TABLET | Freq: Every day | ORAL | Status: DC

## 2024-04-07 MED ORDER — ACETAMINOPHEN 500 MG PO TABS: 1000.0000 mg | ORAL_TABLET | Freq: Three times a day (TID) | ORAL | Status: AC | PRN

## 2024-04-07 MED ORDER — EPHEDRINE SULFATE (PRESSORS) 50 MG/ML IJ SOLN
INTRAMUSCULAR | Status: DC | PRN
Start: 2024-04-07 — End: 2024-04-07
  Administered 2024-04-07: 5 mg via INTRAVENOUS

## 2024-04-07 MED ORDER — PROPOFOL 10 MG/ML IV BOLUS
INTRAVENOUS | Status: DC | PRN
  Administered 2024-04-07: 70 mg via INTRAVENOUS

## 2024-04-07 MED ORDER — FENTANYL CITRATE (PF) 100 MCG/2ML IJ SOLN: 25.0000 ug | INTRAMUSCULAR | Status: DC | PRN

## 2024-04-07 MED ORDER — PHENYLEPHRINE HCL-NACL 20-0.9 MG/250ML-% IV SOLN
INTRAVENOUS | Status: DC | PRN
  Administered 2024-04-07: 20 ug/min via INTRAVENOUS

## 2024-04-07 MED ORDER — LACTATED RINGERS IV SOLN: INTRAVENOUS | Status: DC | PRN

## 2024-04-07 MED ORDER — LIDOCAINE 2% (20 MG/ML) 5 ML SYRINGE
INTRAMUSCULAR | Status: DC | PRN
  Administered 2024-04-07: 50 mg via INTRAVENOUS

## 2024-04-07 NOTE — Transfer of Care (Signed)
 Immediate Anesthesia Transfer of Care Note  Patient: Tiffany [name]  Procedure(s) Performed: VIDEO BRONCHOSCOPY WITH ENDOBRONCHIAL NAVIGATION (Bilateral) BRONCHOSCOPY, WITH BRUSH BIOPSY BRONCHOSCOPY, WITH BIOPSY BRONCHOSCOPY, WITH NEEDLE ASPIRATION BIOPSY  Patient Location: PACU  Anesthesia Type:General  Level of Consciousness: awake, alert , and oriented  Airway & Oxygen  Therapy: Patient Spontanous Breathing and Patient connected to nasal cannula oxygen   Post-op Assessment: Report given to RN and Post -op Vital signs reviewed and stable  Post vital signs: Reviewed and stable   Last Vitals:  Vitals Value Taken Time  BP 124/44 04/07/24 1057  Temp 97.8   Pulse 91 04/07/24 1059  Resp 19 04/07/24 1059  SpO2 93 % 04/07/24 1059  Vitals shown include unfiled device data.  Last Pain:  Vitals:   04/07/24 0731  TempSrc:   PainSc: 0-No pain         Complications: No notable events documented.

## 2024-04-07 NOTE — Op Note (Signed)
 Video Bronchoscopy with Robotic Assisted Bronchoscopic Navigation   Date of Operation: 04/07/2024   Pre-op Diagnosis: Bilateral mixed density pulmonary nodules  Post-op Diagnosis: Same  Surgeon: Racheal Buddle  Assistants: None  Anesthesia: General endotracheal anesthesia  Operation: Flexible video fiberoptic bronchoscopy with robotic assistance and biopsies.  Estimated Blood Loss: Minimal  Complications: None  Indications and History: Tiffany [name] is a 78 y.o. female with history of adenocarcinoma of the lung, also granulosa tumor in the abdomen.  Surveillance CT chest shows some evolving mixed density pulmonary nodular disease specifically a 9 mm peripheral right middle lobe nodule, 18 mm enlarging right lower lobe nodule, subsolid left lower lobe 15 mm nodule. Recommendation made to achieve a tissue diagnosis via robotic assisted navigational bronchoscopy.  The risks, benefits, complications, treatment options and expected outcomes were discussed with the patient.  The possibilities of pneumothorax, pneumonia, reaction to medication, pulmonary aspiration, perforation of a viscus, bleeding, failure to diagnose a condition and creating a complication requiring transfusion or operation were discussed with the patient who freely signed the consent.    Description of Procedure: The patient was seen in the Preoperative Area, was examined and was deemed appropriate to proceed.  The patient was taken to [facility] Endoscopy room 3, identified as Tiffany [name] and the procedure verified as Flexible Video Fiberoptic Bronchoscopy.  A Time Out was held and the above information confirmed.   Prior to the date of the procedure a high-resolution CT scan of the chest was performed. Utilizing ION software program a virtual tracheobronchial tree was generated to allow the creation of distinct navigation pathways to the patient's parenchymal abnormalities. After being taken to the operating room general  anesthesia was initiated and the patient  was orally intubated. The video fiberoptic bronchoscope was introduced via the endotracheal tube and a general inspection was performed which showed normal right and left lung anatomy. Aspiration of the bilateral mainstems was completed to remove any remaining secretions. Robotic catheter inserted into patient's endotracheal tube.   Target #1 right lower lobe nodule: The distinct navigation pathways prepared prior to this procedure were then utilized to navigate to patient's lesion identified on CT scan. The robotic catheter was secured into place and the vision probe was withdrawn.  Lesion location was approximated using fluoroscopy.  Local registration and targeting was performed using [employer] Cios mobile C-arm three-dimensional imaging. Under fluoroscopic guidance transbronchial needle brushings, transbronchial needle biopsies, and transbronchial forceps biopsies were performed to be sent for cytology and pathology.  Needle-in-lesion was confirmed using Cios mobile C-arm.    Target #2 left lower lobe nodule: The distinct navigation pathways prepared prior to this procedure were then utilized to navigate to patient's lesion identified on CT scan. The robotic catheter was secured into place and the vision probe was withdrawn.  Lesion location was approximated using fluoroscopy.  Local registration and targeting was performed using [employer] Cios mobile C-arm three-dimensional imaging. Under fluoroscopic guidance transbronchial needle brushings, transbronchial needle biopsies, and transbronchial forceps biopsies were performed to be sent for cytology and pathology.  Needle-in-lesion was confirmed using Cios mobile C-arm.    Attempt was made to navigate to the small peripheral right middle lobe nodule but it was not accessible due to airway narrowing.  At the end of the procedure a general airway inspection was performed and there was no  evidence of active bleeding. The bronchoscope was removed.  The patient tolerated the procedure well. There was no significant blood loss and there were no  obvious complications. A post-procedural chest x-ray is pending.  Samples Target #1: 1. Transbronchial brushings from right lower lobe nodule 2. Transbronchial Wang needle biopsies from right lower lobe nodule 3. Transbronchial forceps biopsies from right lower lobe nodule  Samples Target #2: 1. Transbronchial needle brushings from left lower lobe nodule 2. Transbronchial Wang needle biopsies from left lower lobe nodule 3. Transbronchial forceps biopsies from left lower lobe nodule   Plans:  The patient will be discharged from the PACU to home when recovered from anesthesia and after chest x-ray is reviewed. We will review the cytology, pathology and microbiology results with the patient when they become available. Outpatient followup will be with Dr. Broderick Canning, NP, Dr. Maryalice Smaller.    Racheal Buddle, MD, PhD 04/07/2024, 10:44 AM [facility] [phone] or if no answer before 7:00PM call [phone] For any issues after 7:00PM please call eLink [phone]

## 2024-04-07 NOTE — H&P (Signed)
 Tiffany [name] is an 78 y.o. female.   Chief Complaint: Abnormal CT chest HPI: 78 year old woman with a history of former tobacco use and emphysematous COPD followed by Dr. 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 Maryalice Smaller.   Surveillance imaging CT chest 03/05/24 showed the treated right upper lobe nodule without any clear evidence for recurrence, multiple additional subsolid and solid nodular opacities some of which have increased in size compared with her prior imaging.  Specifically there is a 9 x 7 mm right middle lobe nodule that has increased, right lower lobe nodule 18 x 11 mm, increased in size, subsolid left lower lobe nodule 15 x 13 mm that is stable in the short interval.  Recommendation made to achieve a tissue diagnosis via robotic assisted navigational bronchoscopy.  The patient understands the procedure rationale, risks, benefits.  She is at some increased risk given her emphysema and hypoxemic respiratory failure.  She states that she has been "washed out" since chemotherapy, globally weak.  She denies significant dyspnea.  She wears her oxygen  at 2 L/min.  Minimal sputum, minimal cough.  Past Medical History:  Diagnosis Date   Abnormal PFT 09/02/2014   moderate restrictive ventilatory defect.   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 Fleurette Humbles   Colon polyp 12/2003   Diverticulosis 11/2010   seen on colonoscopy and air contrast BE   Emphysema of lung (HCC)    Family history of breast cancer    GERD (gastroesophageal reflux disease)    History of radiation therapy    Right Lung-09/04/23-09/09/23- Dr. Retta Caster   Hyperlipidemia     Hypertension    previously treated with meds, resolved   MGUS (monoclonal gammopathy of unknown significance) 1999   prevously under care of Dr. 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   Tobacco abuse    quit in 2019    Past Surgical History:  Procedure Laterality Date   BASAL CELL CARCINOMA EXCISION  11/26/2008   L face, R temple   BILATERAL SALPINGOOPHORECTOMY  11/26/2005   cancerous tumor R ovary   BIOPSY  10/07/2023   Procedure: BIOPSY;  Surgeon: Normie Becton., MD;  Location: [facility];  Service: Gastroenterology;;   BRONCHIAL BIOPSY  07/09/2023   Procedure: BRONCHIAL BIOPSIES;  Surgeon: Denson Flake, MD;  Location: [facility] ENDOSCOPY;  Service: Pulmonary;;   BRONCHIAL BRUSHINGS  07/09/2023   Procedure: BRONCHIAL BRUSHINGS;  Surgeon: Denson Flake, MD;  Location: [facility] ENDOSCOPY;  Service: Pulmonary;;   BRONCHIAL NEEDLE ASPIRATION BIOPSY  07/09/2023   Procedure: BRONCHIAL NEEDLE ASPIRATION BIOPSIES;  Surgeon: Denson Flake, MD;  Location: [facility] ENDOSCOPY;  Service: Pulmonary;;   CATARACT EXTRACTION  11/27/2007   bilateral   COLONOSCOPY  11/26/2010   Dr. Lincoln Renshaw. Diverticulosis. BE also done--normal   ESOPHAGOGASTRODUODENOSCOPY (EGD) WITH  PROPOFOL  N/A 10/07/2023   Procedure: ESOPHAGOGASTRODUODENOSCOPY (EGD) WITH PROPOFOL ;  Surgeon: Brice Campi Albino Alu., MD;  Location: [facility] ENDOSCOPY;  Service: Gastroenterology;  Laterality: N/A;   EUS N/A 10/07/2023   Procedure: UPPER ENDOSCOPIC ULTRASOUND (EUS) RADIAL;  Surgeon: Normie Becton., MD;  Location: [facility] ENDOSCOPY;  Service: Gastroenterology;  Laterality: N/A;   FIDUCIAL MARKER PLACEMENT  07/09/2023   Procedure:  FIDUCIAL MARKER PLACEMENT;  Surgeon: Denson Flake, MD;  Location: [facility] ENDOSCOPY;  Service: Pulmonary;;   FINE NEEDLE ASPIRATION N/A 10/07/2023   Procedure: FINE NEEDLE ASPIRATION (FNA) LINEAR;  Surgeon: Normie Becton., MD;  Location: [facility];  Service: Gastroenterology;  Laterality: N/A;   OMENTECTOMY  11/26/2005   and pelvic lymphadenectomy (with BSO)   PORTACATH PLACEMENT N/A 11/22/2023   Procedure: INSERTION PORT-A-CATH;  Surgeon: Lockie Rima, MD;  Location: [facility] OR;  Service: General;  Laterality: N/A;   ROBOTIC ASSISTED LAPAROSCOPIC LYSIS OF ADHESION N/A 07/18/2023   Procedure: ROBOTIC ASSISTED PELVIC MASS EXCISION WITH RADICAL DISSECTION, MINI LAPAROTOMY, CYSTOSCOPY;  Surgeon: Suzi Essex, MD;  Location: [facility] ORS;  Service: Gynecology;  Laterality: N/A;   SKIN CANCER EXCISION Left 07/2021   neck; pt can't recall type of cancer   TONSILLECTOMY  age 60   VAGINAL HYSTERECTOMY  late 20's   "didn't want any more children"    Family History  Problem Relation Age of Onset   Stroke Mother    Cerebral aneurysm Mother    Hypertension Father    Suicidality Father    Diabetes Maternal Aunt    Diabetes Maternal Aunt    Breast cancer Maternal Aunt    Diabetes Maternal Aunt    Breast cancer Maternal Aunt    Throat cancer Maternal Uncle    Lung cancer Maternal Uncle    Colon cancer Neg Hx    Ovarian cancer Neg Hx    Endometrial cancer Neg Hx    Pancreatic cancer Neg Hx    Social History:  reports that she quit smoking about 5 years ago. Her smoking use included cigarettes. She started smoking about 65 years ago. She has a 119.6 pack-year smoking history. She has been exposed to tobacco smoke. She has never used smokeless tobacco. She reports that she does not currently use alcohol. She reports that she does not use drugs.  Allergies: No Known Allergies  Medications Prior to Admission  Medication Sig Dispense Refill   albuterol  (VENTOLIN  HFA) 108 (90 Base) MCG/ACT  inhaler Inhale 2 puffs into the lungs every 4 (four) hours as needed for wheezing or shortness of breath. 6.7 g 0   cholecalciferol (VITAMIN D3) 25 MCG (1000 UNIT) tablet Take 1,000 Units by mouth daily.     Coenzyme Q10 (COQ10) 100 MG CAPS Take 100 mg by mouth at bedtime.     lidocaine -prilocaine  (EMLA ) cream Apply to affected area once (Patient taking differently: Apply 1 Application topically as needed (for port access).) 30 g 3   losartan  (COZAAR ) 50 MG tablet Take 1 tablet (50 mg total) by mouth daily. 90 tablet 3   ondansetron  (ZOFRAN ) 8 MG tablet Take 1 tablet (8 mg total) by mouth every 8 (eight) hours as needed for nausea or vomiting. 30 tablet 1   prochlorperazine  (COMPAZINE ) 10 MG tablet Take 1 tablet (10 mg total) by mouth every 6 (six) hours as needed for nausea or vomiting. 30 tablet 1   rosuvastatin  (CRESTOR ) 10 MG tablet Take 1 tablet (10 mg total) by mouth daily. (Patient taking differently: Take 10 mg  by mouth at bedtime.) 90 tablet 3   Tiotropium Bromide -Olodaterol (STIOLTO RESPIMAT ) 2.5-2.5 MCG/ACT AERS INHALE 2 PUFFS BY MOUTH ONCE DAILY (Patient taking differently: Inhale 2 puffs into the lungs in the morning.) 4 g 11   acetaminophen  (TYLENOL ) 500 MG tablet Take 2 tablets (1,000 mg total) by mouth every 8 (eight) hours as needed for mild pain (pain score 1-3). May use Tylenol  for next 1-3 days if needed for abdominal pain, but otherwise call office if any issues arise (Patient taking differently: Take 1,000 mg by mouth every 8 (eight) hours as needed for mild pain (pain score 1-3).)     albuterol  (VENTOLIN  HFA) 108 (90 Base) MCG/ACT inhaler INHALE 2 PUFFS BY MOUTH EVERY 6 HOURS AS NEEDED FOR WHEEZING FOR SHORTNESS OF BREATH . APPOINTMENT REQUIRED FOR FUTURE REFILLS (Patient taking differently: Inhale 2 puffs into the lungs every 6 (six) hours as needed for wheezing or shortness of breath.) 9 g 3   magic mouthwash (nystatin , lidocaine , diphenhydrAMINE, alum & mag hydroxide) suspension  Take 5 mLs by mouth 4 (four) times daily as needed for mouth pain. 400 mL 0   mirtazapine  (REMERON ) 7.5 MG tablet Take 1 tablet (7.5 mg total) by mouth at bedtime. 30 tablet 0   predniSONE  (DELTASONE ) 10 MG tablet Prednisone  40 mg po daily x  2 day then Prednisone  30 mg po daily x 2 day then Prednisone  20 mg po daily x 2 day then Prednisone  10 mg daily x 2 day then stop... (Patient not taking: Reported on 04/03/2024) 20 tablet 0    No results found for this or any previous visit (from the past 48 hours). No results found.  Review of Systems  Blood pressure (!) 143/63, pulse 65, temperature 98.2 F (36.8 C), temperature source Oral, resp. rate 20, height 5\' 5"  (1.651 m), weight 52.6 kg, SpO2 97%. Physical Exam  Gen: Pleasant, elderly woman laying in bed in no distress, normal affect  ENT: No lesions,  mouth clear,  oropharynx clear, no postnasal drip  Neck: No JVD, no stridor  Lungs: No use of accessory muscles, distant, no crackles or wheezing on normal respiration, no wheeze on forced expiration  Cardiovascular: RRR, heart sounds normal, no murmur or gallops, no peripheral edema  Abdomen: soft and NT, no HSM,  BS normal  Musculoskeletal: No deformities, no cyanosis or clubbing  Neuro: alert, awake, non focal  Skin: Warm, no lesions or rash  Assessment/Plan Bilateral pulmonary nodules in a patient with a history of recurrent granulosa tumor and right upper lobe adenocarcinoma.  Plan is for robotic assisted navigational bronchoscopy to achieve a tissue diagnosis.  Patient understands the risks and benefits and agrees to proceed.  No barriers identified.  Denson Flake, MD 04/07/2024, 8:55 AM

## 2024-04-07 NOTE — Anesthesia Postprocedure Evaluation (Signed)
 Anesthesia Post Note  Patient: Tiffany [name]  Procedure(s) Performed: VIDEO BRONCHOSCOPY WITH ENDOBRONCHIAL NAVIGATION (Bilateral) BRONCHOSCOPY, WITH BRUSH BIOPSY BRONCHOSCOPY, WITH BIOPSY BRONCHOSCOPY, WITH NEEDLE ASPIRATION BIOPSY     Patient location during evaluation: PACU Anesthesia Type: General Level of consciousness: awake and alert, oriented and patient cooperative Pain management: pain level controlled Vital Signs Assessment: post-procedure vital signs reviewed and stable Respiratory status: spontaneous breathing, nonlabored ventilation and respiratory function stable Cardiovascular status: blood pressure returned to baseline and stable Postop Assessment: no apparent nausea or vomiting Anesthetic complications: no   There were no known notable events for this encounter.  Last Vitals:  Vitals:   04/07/24 1145 04/07/24 1200  BP: (!) 109/44 (!) 112/52  Pulse: 70 73  Resp: 16 17  Temp:    SpO2: 94% 92%    Last Pain:  Vitals:   04/07/24 1145  TempSrc:   PainSc: 0-No pain                 Jacquelyne Matte

## 2024-04-07 NOTE — Discharge Instructions (Addendum)

## 2024-04-07 NOTE — Anesthesia Procedure Notes (Signed)
 Procedure Name: Intubation Date/Time: 04/07/2024 9:14 AM  Performed by: Loreda Rodriguez, CRNAPre-anesthesia Checklist: Patient identified, Emergency Drugs available, Suction available and Patient being monitored Patient Re-evaluated:Patient Re-evaluated prior to induction Oxygen  Delivery Method: Circle System Utilized Preoxygenation: Pre-oxygenation with 100% oxygen  Induction Type: IV induction Ventilation: Mask ventilation without difficulty Laryngoscope Size: Mac and 3 Grade View: Grade I Tube type: Oral Tube size: 8.5 mm Number of attempts: 1 Airway Equipment and Method: Stylet and Oral airway Placement Confirmation: ETT inserted through vocal cords under direct vision, positive ETCO2 and breath sounds checked- equal and bilateral Secured at: 20 cm Tube secured with: Tape Dental Injury: Teeth and Oropharynx as per pre-operative assessment

## 2024-04-08 ENCOUNTER — Encounter (HOSPITAL_COMMUNITY): Payer: Self-pay | Admitting: Emergency Medicine

## 2024-04-08 LAB — CYTOLOGY - NON PAP

## 2024-04-09 ENCOUNTER — Telehealth: Payer: Self-pay | Admitting: *Deleted

## 2024-04-09 LAB — CYTOLOGY - NON PAP

## 2024-04-09 NOTE — Telephone Encounter (Signed)
 CALLED PATIENT TO INFORM OF CT FOR 04-17-24- ARRIVAL TIME- 3:30 PM  @ [facility], NO RESTRICTIONS TO SCAN, PATIENT TO RECEIVE RESULTS FROM DR. KINARD ON 04-23-24 @ 9:45 AM, SPOKE WITH PATIENT AND SHE IS AWARE OF THESE APPTS. AND THE INSTRUCTIONS

## 2024-04-14 ENCOUNTER — Ambulatory Visit (INDEPENDENT_AMBULATORY_CARE_PROVIDER_SITE_OTHER): Admitting: Acute Care

## 2024-04-14 ENCOUNTER — Encounter: Payer: Self-pay | Admitting: Acute Care

## 2024-04-14 VITALS — BP 132/80 | HR 77 | Ht 65.0 in | Wt 113.6 lb

## 2024-04-14 DIAGNOSIS — Z8507 Personal history of malignant neoplasm of pancreas: Secondary | ICD-10-CM | POA: Diagnosis not present

## 2024-04-14 DIAGNOSIS — R042 Hemoptysis: Secondary | ICD-10-CM

## 2024-04-14 DIAGNOSIS — M25559 Pain in unspecified hip: Secondary | ICD-10-CM

## 2024-04-14 DIAGNOSIS — Z87891 Personal history of nicotine dependence: Secondary | ICD-10-CM | POA: Diagnosis not present

## 2024-04-14 DIAGNOSIS — Z923 Personal history of irradiation: Secondary | ICD-10-CM

## 2024-04-14 DIAGNOSIS — Z85118 Personal history of other malignant neoplasm of bronchus and lung: Secondary | ICD-10-CM | POA: Diagnosis not present

## 2024-04-14 DIAGNOSIS — C3432 Malignant neoplasm of lower lobe, left bronchus or lung: Secondary | ICD-10-CM | POA: Diagnosis not present

## 2024-04-14 DIAGNOSIS — G8929 Other chronic pain: Secondary | ICD-10-CM

## 2024-04-14 DIAGNOSIS — J9611 Chronic respiratory failure with hypoxia: Secondary | ICD-10-CM

## 2024-04-14 DIAGNOSIS — Z9889 Other specified postprocedural states: Secondary | ICD-10-CM

## 2024-04-14 NOTE — Progress Notes (Signed)
 History of Present Illness Tiffany [name] is a 78 y.o. female former smoker with PMH of pancreatic cancer and lung cancer, diagnosed November 2024. Both have been treated with surgery as well as SBRT respectively. Surveillance scan done March 05, 2024 showed new pulmonary nodules.  She was referred back to Dr. Baldwin Levee for tissue sampling.  She underwent bronchoscopy with biopsies on 04/07/2024 per Dr. Baldwin Levee .    Pancreatic cancer (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  -PET scan from June 10, 2023 showed a hypermetabolic 3.1 cm mass in the right upper lobe, and hypermetabolic soft tissue fullness in the pancreatic head, and partially solid and cystic 12.4 cm pelvic mass. 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 Orvil Bland  -baseline CA19.9 33 -Pt has seen by pancreatobiliary surgeon Dr. Cherlynn Cornfield, who recommends neoadjuvant chemotherapy due to the uncertainty of lung metastasis.  Patient declined surgery. -Repeated CT on November 01, 2023 showed improved treated lung mass, but several new or enlarging lung nodules, concerning for metastatic disease.  -Patient is agreeable with chemo, she started gemcitabine  and Abraxane  on 12/04/2023 -CT 03/05/24 showed slightly decreased pancreatic mass, and a radiation changes to her lung lesion.  However she has multiple other new pulmonary nodules on the CT chest. - Referred to Dr. Baldwin Levee for bronchoscopy and tissue sampling.   04/14/2024 Pt. Presents for  follow up after bronchoscopy with biopsies on 04/07/2024.  She states she has done well since the procedure.She endorses scant hemoptysis, characterized by coughing up dark blood, which is described as old blood. No fever, discolored secretions, or worsening shortness of breath . Her dyspnea  remains at her baseline baseline. She  had no  adverse reactions to anesthesia.   We have reviewed the results of her biopsies.  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 Biopsies of the RLL showed atypical cells most likely representative of  intra-alveolar  macrophages with reactive changes. Pt. Verbalized understanding.  Pt. Has follow up scheduled with Dr. Eloise Hake 04/23/2024. She has a CT chest scheduled prior.  She has follow up with Dr. Maryalice Smaller 04/30/2023. I did message both with the biopsy results.  She is on two liters of continuous oxygen  and is mobile with a 50-foot line at home. Attempts to obtain a portable oxygen  concentrator were unsuccessful as she was not approved for a pulsed  flow model, as she had significant desaturations. She uses an albuterol  inhaler/ nebs  as needed for shortness of breath or wheezing, and a Stiolto inhaler, taking two puffs daily for maintenance.  She experienced a fall last year, resulting in hip pain, and was diagnosed with arthritis and scoliosis. Her hip pain limits her activities more than her breathing issues..  She discontinued chemotherapy due to weakness and fatigue, which affected her breathing. During chemotherapy, her oxygen  saturation would drop significantly, necessitating a short term  increase in oxygen  to 3 or 4 L.    Test Results: Cytology 04/07/2024 FINAL MICROSCOPIC DIAGNOSIS:   A. LUNG, RLL, BRUSHING:  - Atypical cells present, see comment   B. LUNG, RLL, FINE NEEDLE ASPIRATION:  - Atypical cells present, see comment   FINAL MICROSCOPIC DIAGNOSIS:   C. LUNG, LLL, BRUSHING:  - No malignant cells identified  -  Rare benign bronchial cells and macrophages  - Predominantly blood   D. 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  However, metastatic adenocarcinoma cannot be  entirely ruled out.   CT Chest, Abdomen,Pelvis 03/05/2024>> Lung cancer restaging Mediastinum/Nodes: No enlarged mediastinal, hilar, or axillary lymph nodes. Thyroid  gland, trachea, and esophagus demonstrate no significant findings.   Lungs/Pleura: Severe emphysema and diffuse bilateral bronchial wall thickening. Subsolid lesion of the peripheral right upper lobe near a fiducial marker is essentially indistinguishable from background on today's examination with new subpleural consolidation consistent with developing radiation fibrosis (series 4, image 82). Additional adjacent small nodule located medially is unchanged at 0.5 cm (series 4, image 83). Multiple additional subsolid and predominantly solid nodular opacities, predominantly in the periphery of the right lung, several of which are increased in size and solid character, for example in the lateral segment right middle lobe measuring 0.9 x 0.7 cm, previously no greater than 0.3 cm (series 4, image 105), and in the dependent right lower lobe measuring 1.8 x 1.1 cm, previously 1.1 x 0.9 cm (series 4, image 110). Additional subsolid opacity in the dependent left lower lobe not significantly changed at 1.5 x 1.3 cm (series 4, image 73). No pleural effusion or pneumothorax.  Subsolid lung lesion of the peripheral right upper lobe near a fiducial marker is essentially indistinguishable from background on today's examination with new subpleural consolidation consistent with developing radiation fibrosis. Findings consistent with treatment response of the lesion to local radiation therapy. 3. Multiple additional subsolid and predominantly solid nodular opacities, predominantly in the periphery of the right lung, several of which are increased in size and solid character, appearance and behavior most consistent with worsened multifocal  pulmonary adenocarcinoma although pulmonary metastases not generally excluded in the setting of pancreatic adenocarcinoma. 4. Interval enlargement of a cystic lesion with some peripheral soft tissue in the left lower quadrant, of uncertain nature. This is possibly an unusual cystic metastasis or possibly of ovarian origin if there has been ovarian transposition. 5. Severe emphysema.          Latest Ref Rng & Units 04/01/2024   12:41 PM 03/11/2024   10:26 AM 02/26/2024    1:59 PM  CBC  WBC 4.0 - 10.5 K/uL 5.9  3.7  8.1   Hemoglobin 12.0 - 15.0 g/dL 16.1  09.6  04.5   Hematocrit 36.0 - 46.0 % 34.4  34.8  38.9   Platelets 150 - 400 K/uL 284  251  175        Latest Ref Rng & Units 04/01/2024   12:41 PM 03/11/2024   10:26 AM 02/26/2024    1:59 PM  BMP  Glucose 70 - 99 mg/dL 409  811  914   BUN 8 - 23 mg/dL 11  11  18    Creatinine 0.44 - 1.00 mg/dL 7.82  9.56  2.13   Sodium 135 - 145 mmol/L 136  134  133   Potassium 3.5 - 5.1 mmol/L 3.8  4.1  3.9   Chloride 98 - 111 mmol/L 95  96  95   CO2 22 - 32 mmol/L 35  33  29   Calcium  8.9 - 10.3 mg/dL 9.3  9.8  9.2     BNP    Component Value Date/Time   BNP 80.8 02/12/2024 1434    ProBNP No results found for: "  PROBNP"  PFT No results found for: "FEV1PRE", "FEV1POST", "FVCPRE", "FVCPOST", "TLC", "DLCOUNC", "PREFEV1FVCRT", "PSTFEV1FVCRT"  DG Chest Port 1 View Result Date: 04/07/2024 CLINICAL DATA:  Status post bronchoscopy with biopsy. EXAM: PORTABLE CHEST 1 VIEW COMPARISON:  February 12, 2024.  March 05, 2024. FINDINGS: The heart size and mediastinal contours are within normal limits. Left subclavian Port-A-Cath is unchanged. Stable reticular densities are noted throughout both lungs suggesting emphysematous disease and probably associated scarring. Stable irregular pleural based density is noted laterally in right lung with fiducial marker which may represent radiation fibrosis as noted on prior studies. Multiple bilateral upper lobe nodular  densities are noted which may represent metastatic disease. No pneumothorax is noted. The visualized skeletal structures are unremarkable. IMPRESSION: Stable reticular densities are noted suggesting emphysematous disease and probable associated scarring. Stable pleural based density noted laterally in right lung which may represent radiation fibrosis. Multiple bilateral upper lobe nodular densities are noted which may represent pulmonary nodule and possible metastatic disease. Electronically Signed   By: Rosalene Colon M.D.   On: 04/07/2024 13:33   DG CHEST PORT 1 VIEW Result Date: 04/07/2024 CLINICAL DATA:  Bronchoscopy. EXAM: PORTABLE CHEST 1 VIEW COMPARISON:  Chest radiograph dated 02/12/2024. FINDINGS: Left-sided Port-A-Cath with tip over central SVC. Similar appearance of right lateral subpleural nodule. No new consolidative changes. There is no pleural effusion pneumothorax. The cardiac silhouette is within normal limits. No acute osseous pathology. IMPRESSION: 1. No active disease.  No pneumothorax. 2. Similar appearance of right lateral subpleural nodule. Electronically Signed   By: Angus Bark M.D.   On: 04/07/2024 13:29   DG C-ARM BRONCHOSCOPY Result Date: 04/07/2024 C-ARM BRONCHOSCOPY: Fluoroscopy was utilized by the requesting physician.  No radiographic interpretation.   DG C-Arm 1-60 Min-No Report Result Date: 04/07/2024 Fluoroscopy was utilized by the requesting physician.  No radiographic interpretation.     Past medical hx Past Medical History:  Diagnosis Date   Abnormal PFT 09/02/2014   moderate restrictive ventilatory defect.   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 Fleurette Humbles   Colon polyp 12/2003   Diverticulosis 11/2010   seen on colonoscopy and air contrast BE   Emphysema of lung (HCC)    Family history of breast cancer    GERD (gastroesophageal reflux  disease)    Heart valve disease    History of radiation therapy    Right Lung-09/04/23-09/09/23- Dr. Retta Caster   Hyperlipidemia    Hypertension    previously treated with meds, resolved   MGUS (monoclonal gammopathy of unknown significance) 1999   prevously under care of Dr. 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 Her smoking use included cigarettes. She started smoking about 65 years ago. She has a 119.6 pack-year smoking history. She has been exposed to tobacco smoke. She has never used smokeless tobacco. She reports that she does not currently use alcohol. She reports that she does not use drugs.  Tobacco Cessation: Counseling given: Not Answered Former smoker with a 119.6-pack-year smoking history quit 2020  Past surgical hx, Family hx, Social hx all reviewed.  Current Outpatient Medications on File Prior to Visit  Medication Sig   acetaminophen  (TYLENOL ) 500 MG tablet Take 2 tablets (1,000 mg total) by mouth every 8 (eight) hours as needed for mild pain (pain score 1-3).   albuterol  (VENTOLIN  HFA) 108 (90 Base) MCG/ACT inhaler  Inhale 2 puffs into the lungs every 4 (four) hours as needed for wheezing or shortness of breath.   albuterol  (VENTOLIN  HFA) 108 (90 Base) MCG/ACT inhaler INHALE 2 PUFFS BY MOUTH EVERY 6 HOURS AS NEEDED FOR WHEEZING FOR SHORTNESS OF BREATH . APPOINTMENT REQUIRED FOR FUTURE REFILLS (Patient taking differently: Inhale 2 puffs into the lungs every 6 (six) hours as needed for wheezing or shortness of breath.)   cholecalciferol (VITAMIN D3) 25 MCG (1000 UNIT) tablet Take 1,000 Units by mouth daily.   Coenzyme Q10 (COQ10) 100 MG CAPS Take 100 mg by mouth at bedtime.   lidocaine -prilocaine  (EMLA ) cream Apply 1 Application topically as needed (for port access).   losartan  (COZAAR ) 50 MG tablet Take 1 tablet (50 mg total) by mouth daily.   prochlorperazine  (COMPAZINE ) 10 MG tablet Take 1 tablet (10 mg total) by mouth every 6 (six) hours as needed for nausea or vomiting.   rosuvastatin  (CRESTOR ) 10 MG tablet Take 1 tablet (10 mg total) by mouth at bedtime.   Tiotropium Bromide -Olodaterol (STIOLTO RESPIMAT ) 2.5-2.5 MCG/ACT AERS INHALE 2 PUFFS BY MOUTH ONCE DAILY (Patient taking differently: Inhale 2 puffs into the lungs in the morning.)   magic mouthwash (nystatin , lidocaine , diphenhydrAMINE, alum & mag hydroxide) suspension Take 5 mLs by mouth 4 (four) times daily as needed for mouth pain. (Patient not taking: Reported on 04/14/2024)   mirtazapine  (REMERON ) 7.5 MG tablet Take 1 tablet (7.5 mg total) by mouth at bedtime. (Patient not taking: Reported on 04/14/2024)   ondansetron  (ZOFRAN ) 8 MG tablet Take 1 tablet (8 mg total) by mouth every 8 (eight) hours as needed for nausea or vomiting. (Patient not taking: Reported on 04/14/2024)   No current facility-administered medications on file prior to visit.     No Known Allergies  Review Of Systems:  Constitutional:   +  weight loss, No night sweats,  Fevers, chills, + fatigue, or  lassitude.  HEENT:   No headaches,  Difficulty swallowing,  Tooth/dental  problems, or  Sore throat,                No sneezing, itching, ear ache, nasal congestion, post nasal drip,   CV:  No chest pain,  Orthopnea, PND, swelling in lower extremities, anasarca, dizziness, palpitations, syncope.   GI  No heartburn, + indigestion, No abdominal pain, nausea, vomiting, diarrhea, change in bowel habits, loss of appetite, bloody stools.   Resp: +  shortness of breath with exertion less at rest.  No excess mucus, no productive cough,  No non-productive cough,  + scant tinged  coughing up of blood after biopsy.  No change in color of mucus.  No wheezing.  No chest wall deformity  Skin: no rash or lesions.  GU: no dysuria, change in color of urine, no urgency or frequency.  No flank pain, no hematuria   MS:  No joint pain or swelling.  No decreased range of motion.  No back pain.  Psych:  No change in mood or affect. No depression or anxiety.  No memory loss.   Vital Signs BP 132/80 (BP Location: Left Arm, Patient Position: Sitting, Cuff Size: Normal)   Pulse 77   Ht 5\' 5"  (1.651 m)   Wt 113 lb 9.6 oz (51.5 kg)   SpO2 95%   BMI 18.90 kg/m    Physical Exam:  General- No distress,  A&Ox3, pleasant ENT: No sinus tenderness, TM clear, pale nasal mucosa, no oral exudate,no post nasal drip, no LAN Cardiac: S1, S2, regular rate and rhythm, no murmur Chest: No wheeze/ rales/ dullness; no accessory muscle use, no nasal flaring, no sternal retractions, slightly diminished per bases, few crackles Abd.: Soft Non-tender, ND, BS +, Body mass index is 18.9 kg/m.  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 Metastatic adenocarcinoma cannot be  entirely ruled out. Former smoker  Chronic hypoxic respiratory failure Scant old/dark blood post bronchoscopy Plan I am glad you are doing well since your biopsy. We have reviewed the results , which show a mucinous adenocarcinoma. You have a follow up appointment with Dr. Eloise Hake 04/23/24, and a CT Chest scheduled before your visit.  Follow up with Dr. Maryalice Smaller as is scheduled in June 4,2025 .  Monitor for increase in bright red blood or volume and report immediately. Report signs of infection such as fever or discolored secretions. Wear oxygen  at 2 L [place] Maintain oxygen  sats > 88% at all times.  Increase oxygen  if sats drop below 88% for a short time until oxygen  levels are 90%, then slowly take it back to 2 L . Continue stialto 2 puffs once daily. Use albuterol  inhaler and nebulizers as needed for shortness of breath or wheezing. Call if you need us  sooner.  Please contact office for sooner follow up if symptoms do not improve or worsen or seek emergency care    Arthritis of the hip Chronic hip pain due to arthritis, exacerbated by fall. Impacts mobility and comfort. - Consider using a cushion for seating to alleviate discomfort.   Goals of Care Discontinued chemotherapy due to adverse effects. Prefers radiation therapy. Actively involved in treatment decisions.  I spent 40 minutes dedicated to the care of this patient on the date of this encounter to include pre-visit review of records, face-to-face time with the patient discussing conditions above, post visit ordering of testing, clinical documentation with the electronic health record, making appropriate referrals as documented, and communicating necessary information to the patient's healthcare team.    Raejean Bullock, NP 04/14/2024  7:34 PM

## 2024-04-14 NOTE — Patient Instructions (Addendum)
 It is good to see you today. I am glad you are doing well since your biopsy. We have reviewed the results , which show a mucinous adenocarcinoma. You have a follow up appointment with Dr. Eloise Hake 04/23/24, and a CT Chest scheduled before your visit.  Follow up with Dr. Maryalice Smaller as is scheduled in June 4,2025 .  Wear oxygen  at 2 L [place] Maintain oxygen  sats > 88% at all times.  Increase oxygen  if sats drop below 88% for a short time until oxygen  levels are 90%, then slowly take it back to 2 L [place]. Continue stialto 2 puffs once daily. Use albuterol  inhaler and nebulizers as needed for shortness of breath or wheezing. Call if you need us  sooner.  Please contact office for sooner follow up if symptoms do not improve or worsen or seek emergency care

## 2024-04-15 ENCOUNTER — Telehealth: Payer: Self-pay | Admitting: *Deleted

## 2024-04-15 NOTE — Telephone Encounter (Signed)
 CALLED PATIENT TO INFORM THAT FU APPT. ON 04-23-24 WILL BE WITH PA ELLIE MUIR DUE TO DR. KINARD BEING OFF, LVM FOR A RETURN CALL

## 2024-04-16 ENCOUNTER — Other Ambulatory Visit: Payer: Self-pay | Admitting: *Deleted

## 2024-04-16 NOTE — Progress Notes (Signed)
 The proposed treatment discussed in conference is for discussion purpose only and is not a binding recommendation.  The patients have not been physically examined, or presented with their treatment options.  Therefore, final treatment plans cannot be decided.

## 2024-04-17 ENCOUNTER — Ambulatory Visit (HOSPITAL_COMMUNITY)
Admission: RE | Admit: 2024-04-17 | Discharge: 2024-04-17 | Disposition: A | Discharge status: Home / Self Care | Source: Ambulatory Visit | Attending: Radiology | Admitting: Radiology

## 2024-04-17 DIAGNOSIS — C3411 Malignant neoplasm of upper lobe, right bronchus or lung: Secondary | ICD-10-CM | POA: Diagnosis not present

## 2024-04-17 DIAGNOSIS — I700 Atherosclerosis of aorta: Secondary | ICD-10-CM | POA: Diagnosis not present

## 2024-04-17 DIAGNOSIS — C3490 Malignant neoplasm of unspecified part of unspecified bronchus or lung: Secondary | ICD-10-CM | POA: Diagnosis not present

## 2024-04-17 DIAGNOSIS — J439 Emphysema, unspecified: Secondary | ICD-10-CM | POA: Diagnosis not present

## 2024-04-19 DIAGNOSIS — J9601 Acute respiratory failure with hypoxia: Secondary | ICD-10-CM | POA: Diagnosis not present

## 2024-04-19 DIAGNOSIS — J449 Chronic obstructive pulmonary disease, unspecified: Secondary | ICD-10-CM | POA: Diagnosis not present

## 2024-04-22 ENCOUNTER — Telehealth: Payer: Self-pay | Admitting: Radiation Oncology

## 2024-04-22 NOTE — Telephone Encounter (Signed)
 Pt called asking about moving 5/29 f/u appt due to transportation or making visit via telephone. PA Harrold Lincoln agreed to telephone f/u to review CT results with pt. Pt was advised of this and grateful for telephone option.

## 2024-04-23 ENCOUNTER — Ambulatory Visit
Admission: RE | Admit: 2024-04-23 | Discharge: 2024-04-23 | Disposition: A | Discharge status: Home / Self Care | Source: Ambulatory Visit | Attending: Radiology | Admitting: Radiology

## 2024-04-23 ENCOUNTER — Ambulatory Visit: Admission: RE | Admit: 2024-04-23 | Payer: Self-pay | Source: Ambulatory Visit | Admitting: Radiation Oncology

## 2024-04-23 ENCOUNTER — Encounter: Payer: Self-pay | Admitting: Radiology

## 2024-04-23 DIAGNOSIS — C3411 Malignant neoplasm of upper lobe, right bronchus or lung: Secondary | ICD-10-CM | POA: Diagnosis not present

## 2024-04-23 DIAGNOSIS — C250 Malignant neoplasm of head of pancreas: Secondary | ICD-10-CM

## 2024-04-23 DIAGNOSIS — Z87891 Personal history of nicotine dependence: Secondary | ICD-10-CM

## 2024-04-23 NOTE — Progress Notes (Signed)
 Tiffany [name]  identity was verified. Telephone visit performed.   Lung Side: Right, patient completed treatment on 09/09/23.  Does the patient complain of any of the following: Pain: Reports intermittent pain to right chest.  Shortness of breath w/wo exertion: Yes, continues to use oxygen  at 2 liters.  Cough: Yes, productive.  Hemoptysis: No Pain with swallowing: No Swallowing/choking concerns: No Appetite: Fair Energy Level: Fair Post radiation skin Changes: No    Additional comments if applicable:

## 2024-04-23 NOTE — Progress Notes (Signed)
 Radiation Oncology         [phone]) [phone] ________________________________  Name: Tiffany [name] MRN: [id number]  Date: 04/23/2024  DOB: [birth year]  Follow-Up Visit - Conducted via telephone at patient request.   I spoke with the patient to conduct this follow-up visit via telephone. The patient was notified in advance and was offered an in person or telemedicine meeting to allow for face to face communication but instead preferred to proceed with a telephone consult.  CC: [name], MD  [name], MD  C34.11   ICD-10-CM   1. Adenocarcinoma of upper lobe of right lung (HCC)  C34.11 NM PET Image Restag (PS) Skull Base To Thigh    2. Malignant neoplasm of head of pancreas (HCC)  C25.0 NM PET Image Restag (PS) Skull Base To Thigh    3. 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   Presented with right upper lobe hypermetabolic lung mass as well as an additional suspicious right lower lobe lung nodule that was not hypermetabolic on PET imaging. 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 She was last seen here for follow-up on 01/20/2024. At that time, the patient was doing well overall, CT of the chest in 3 months was recommended.   In the interim, patient underwent CT CAP on 03/05/2024 demonstrated a slightly diminished size of the pancreatic mass; radiation fibrosis at  the treated right upper lobe lesion; and several additional pulmonary nodules that had increased in size, concerning for worsening multifocal pulmonary adenocarcinoma versus pulmonary metastases; and interval enlargement of the cystic lesion in the left lower quadrant.  Patient met with Dr. Maryalice Smaller on 04/01/2024.  Per her recommendations, the patient decided to proceed with 3 to 4 months of chemotherapy with the plan to be followed by radiation to the pancreas for disease management. This was discontinued due to weakness and fatigue, which affected her breathing significantly.   Patient's case was discussed at our tumor board and the consensus was for the patient to proceed with a biopsy of the concerning pulmonary nodules. Patient underwent bronchoscopy and biopsy on 04/07/2024 under the care of Dr. Baldwin Levee. Pathology from the left lower lobe nodule demonstrated moderately differentiated adenocarcinoma; and the right lower lobe nodule demonstrated atypical cells.  Patient met with Eulogio Hidalgo NP on 04/14/2024 to review the results of the biopsy.  She recommended following up with us  to determine treatment plan moving forward.  Today the patient denies any changes in her breathing since we last saw her. She does note shortness of dyspnea on exertion and is on 2L of oxygen  PRN, consistent with her baseline. She also notes a productive cough consisting of clear phlegm. Patient denies any abdominal pain, nausea, vomiting, or any other changes to her bowel habits. She does note intermittent right breast pain, but denies any skin changes to the breast, or nipple discharge or bleeding. Per patient, it has  been a couple years since her last mammogram.          Allergies:  has no known allergies.  Meds: Current Outpatient Medications  Medication Sig Dispense Refill   acetaminophen  (TYLENOL ) 500 MG tablet Take 2 tablets (1,000 mg total) by mouth every 8 (eight) hours as needed for mild pain (pain score 1-3).      albuterol  (VENTOLIN  HFA) 108 (90 Base) MCG/ACT inhaler Inhale 2 puffs into the lungs every 4 (four) hours as needed for wheezing or shortness of breath. 6.7 g 0   albuterol  (VENTOLIN  HFA) 108 (90 Base) MCG/ACT inhaler INHALE 2 PUFFS BY MOUTH EVERY 6 HOURS AS NEEDED FOR WHEEZING FOR SHORTNESS OF BREATH . APPOINTMENT REQUIRED FOR FUTURE REFILLS (Patient taking differently: Inhale 2 puffs into the lungs every 6 (six) hours as needed for wheezing or shortness of breath.) 9 g 3   cholecalciferol (VITAMIN D3) 25 MCG (1000 UNIT) tablet Take 1,000 Units by mouth daily.     Coenzyme Q10 (COQ10) 100 MG CAPS Take 100 mg by mouth at bedtime.     lidocaine -prilocaine  (EMLA ) cream Apply 1 Application topically as needed (for port access).     losartan  (COZAAR ) 50 MG tablet Take 1 tablet (50 mg total) by mouth daily. 90 tablet 3   mirtazapine  (REMERON ) 7.5 MG tablet Take 1 tablet (7.5 mg total) by mouth at bedtime. 30 tablet 0   rosuvastatin  (CRESTOR ) 10 MG tablet Take 1 tablet (10 mg total) by mouth at bedtime.     Tiotropium Bromide -Olodaterol (STIOLTO RESPIMAT ) 2.5-2.5 MCG/ACT AERS INHALE 2 PUFFS BY MOUTH ONCE DAILY (Patient taking differently: Inhale 2 puffs into the lungs in the morning.) 4 g 11   magic mouthwash (nystatin , lidocaine , diphenhydrAMINE, alum & mag hydroxide) suspension Take 5 mLs by mouth 4 (four) times daily as needed for mouth pain. (Patient not taking: Reported on 04/23/2024) 400 mL 0   ondansetron  (ZOFRAN ) 8 MG tablet Take 1 tablet (8 mg total) by mouth every 8 (eight) hours as needed for nausea or vomiting. (Patient not taking: Reported on 04/23/2024) 30 tablet 1   prochlorperazine  (COMPAZINE ) 10 MG tablet Take 1 tablet (10 mg total) by mouth every 6 (six) hours as needed for nausea or vomiting. (Patient not taking: Reported on 04/23/2024) 30 tablet 1   No current facility-administered medications for this encounter.    Physical Findings: Deferred, due to danger of the telephone  visit.   Lab Findings: Lab Results  Component Value Date   WBC 5.9 04/01/2024   HGB 12.1 04/01/2024   HCT 34.4 (L) 04/01/2024   MCV 95.0 04/01/2024   PLT 284 04/01/2024    Radiographic Findings: CT CHEST WO CONTRAST Result Date: 04/19/2024 CLINICAL DATA:  Non-small cell lung cancer EXAM: CT CHEST WITHOUT CONTRAST TECHNIQUE: Multidetector CT imaging of the chest was performed following the standard protocol without IV contrast. RADIATION DOSE REDUCTION: This exam was performed according to the departmental dose-optimization program which includes automated exposure control, adjustment of the mA and/or kV according to patient size and/or use of iterative reconstruction technique. COMPARISON:  CT of the chest performed March 05, 2024 FINDINGS: Cardiovascular: Normal size heart. No pericardial effusion. Atherosclerotic changes are present in the thoracic aorta. Left-sided approach central venous port. Mediastinum/Nodes: An anterior carinal lymph node is present which measures 1.0 cm in short axis dimension, similar when compared to the previous exam. Lungs/Pleura: No significant pleural effusion or pneumothorax. Emphysema with bronchial wall thickening. 1. Increase in soft tissue density right upper  lobe adjacent to fiducial marker on image 77 of CT series 2 measuring 8 mm. There is increased soft tissue density along the pleural surface of the site as well. 2. Peripheral parenchymal nodule in the right lower lobe on image 96 of series 2 measures 1.9 x 1.6 cm, previously 1.8 x 1.1 cm. 3. Medial left upper lobe pulmonary nodule on image 35 of series 2 measures 3.2 x 1.3 cm, previously 2.4 x 1.2 cm. There are dish in all parenchymal nodules which demonstrate an increase in size and density when compared to the previous exam. A left lower lobe nodule has developed a cavitary component. Upper Abdomen: Not significantly changed. Musculoskeletal: Degenerative changes with diffuse osteopenia. IMPRESSION: 1.  Progression of disease with increase in multiple pulmonary nodules as well as an increase in soft tissue density at the site of the previously measured fiducial marker. 2. Stable appearance of anterior carinal lymph node. 3. No pleural effusion. Electronically Signed   By: Reagan Camera M.D.   On: 04/19/2024 11:20   DG Chest Port 1 View Result Date: 04/07/2024 CLINICAL DATA:  Status post bronchoscopy with biopsy. EXAM: PORTABLE CHEST 1 VIEW COMPARISON:  February 12, 2024.  March 05, 2024. FINDINGS: The heart size and mediastinal contours are within normal limits. Left subclavian Port-A-Cath is unchanged. Stable reticular densities are noted throughout both lungs suggesting emphysematous disease and probably associated scarring. Stable irregular pleural based density is noted laterally in right lung with fiducial marker which may represent radiation fibrosis as noted on prior studies. Multiple bilateral upper lobe nodular densities are noted which may represent metastatic disease. No pneumothorax is noted. The visualized skeletal structures are unremarkable. IMPRESSION: Stable reticular densities are noted suggesting emphysematous disease and probable associated scarring. Stable pleural based density noted laterally in right lung which may represent radiation fibrosis. Multiple bilateral upper lobe nodular densities are noted which may represent pulmonary nodule and possible metastatic disease. Electronically Signed   By: Rosalene Colon M.D.   On: 04/07/2024 13:33   DG CHEST PORT 1 VIEW Result Date: 04/07/2024 CLINICAL DATA:  Bronchoscopy. EXAM: PORTABLE CHEST 1 VIEW COMPARISON:  Chest radiograph dated 02/12/2024. FINDINGS: Left-sided Port-A-Cath with tip over central SVC. Similar appearance of right lateral subpleural nodule. No new consolidative changes. There is no pleural effusion pneumothorax. The cardiac silhouette is within normal limits. No acute osseous pathology. IMPRESSION: 1. No active disease.  No  pneumothorax. 2. Similar appearance of right lateral subpleural nodule. Electronically Signed   By: Angus Bark M.D.   On: 04/07/2024 13:29   DG C-ARM BRONCHOSCOPY Result Date: 04/07/2024 C-ARM BRONCHOSCOPY: Fluoroscopy was utilized by the requesting physician.  No radiographic interpretation.   DG C-Arm 1-60 Min-No Report Result Date: 04/07/2024 Fluoroscopy was utilized by the requesting physician.  No radiographic interpretation.    Impression/Plan:   Stage IB vs IV (cT2, N0, Mx) pancreatic cancer with lung nodules  Most recent CT scan demonstrates multiple enlarging pulmonary nodules, concerning for progressive multifocal adenocarcinoma of the lungs versus metastatic disease from pancreatic primary. Patient is currently asymptomatic from these lesions. Given the extent of visualized disease, we would not be able to treat all of the lung nodules with radiation. Dr. Eloise Hake recommends PET imaging for further work-up. We will call the patient with the results and recommendations moving forward.   Notably, patient is no longer on systemic chemotherapy due to poor tolerance. She has a follow-up scheduled with Dr. Maryalice Smaller on 04/29/2024. We will defer to medical oncology for discussion  of potential treatment options prior to moving forward with any radiotherapy.    Right breast pain  Patient complains of intermittent right breast pain. She states that her last mammogram was performed a couple years ago. Recommended mammogram for further evaluation; however, patient declined this today.       This encounter was conducted via telephone.  The patient has provided two factor identification and has given verbal consent for this type of encounter and has been advised to only accept a meeting of this type in a secure network environment.  The time spent during this encounter was 20 minutes including preparation, discussion, and coordination of the patient's care  The attendants for this meeting  include Julio Ohm PA-C and patient. During the encounter and Arlander Labrum was located at [facility] Radiation Oncology Department.  Patient was located at home. ____________________________________   Julio Ohm, PA-C

## 2024-04-29 ENCOUNTER — Other Ambulatory Visit: Payer: Self-pay

## 2024-04-29 ENCOUNTER — Encounter: Payer: Self-pay | Admitting: Hematology

## 2024-04-29 ENCOUNTER — Inpatient Hospital Stay (HOSPITAL_BASED_OUTPATIENT_CLINIC_OR_DEPARTMENT_OTHER): Admitting: Hematology

## 2024-04-29 ENCOUNTER — Inpatient Hospital Stay: Attending: Gynecologic Oncology

## 2024-04-29 VITALS — BP 118/68 | HR 87 | Temp 97.2°F | Resp 15 | Ht 65.0 in | Wt 113.1 lb

## 2024-04-29 DIAGNOSIS — C250 Malignant neoplasm of head of pancreas: Secondary | ICD-10-CM

## 2024-04-29 DIAGNOSIS — Z9981 Dependence on supplemental oxygen: Secondary | ICD-10-CM | POA: Insufficient documentation

## 2024-04-29 DIAGNOSIS — J439 Emphysema, unspecified: Secondary | ICD-10-CM | POA: Insufficient documentation

## 2024-04-29 DIAGNOSIS — Z803 Family history of malignant neoplasm of breast: Secondary | ICD-10-CM | POA: Diagnosis not present

## 2024-04-29 DIAGNOSIS — I10 Essential (primary) hypertension: Secondary | ICD-10-CM | POA: Diagnosis not present

## 2024-04-29 DIAGNOSIS — C3432 Malignant neoplasm of lower lobe, left bronchus or lung: Secondary | ICD-10-CM | POA: Insufficient documentation

## 2024-04-29 DIAGNOSIS — C3411 Malignant neoplasm of upper lobe, right bronchus or lung: Secondary | ICD-10-CM | POA: Insufficient documentation

## 2024-04-29 DIAGNOSIS — M8580 Other specified disorders of bone density and structure, unspecified site: Secondary | ICD-10-CM | POA: Insufficient documentation

## 2024-04-29 DIAGNOSIS — Z860100 Personal history of colon polyps, unspecified: Secondary | ICD-10-CM | POA: Insufficient documentation

## 2024-04-29 DIAGNOSIS — Z9221 Personal history of antineoplastic chemotherapy: Secondary | ICD-10-CM | POA: Diagnosis not present

## 2024-04-29 DIAGNOSIS — Z90722 Acquired absence of ovaries, bilateral: Secondary | ICD-10-CM | POA: Diagnosis not present

## 2024-04-29 DIAGNOSIS — Z79899 Other long term (current) drug therapy: Secondary | ICD-10-CM | POA: Insufficient documentation

## 2024-04-29 DIAGNOSIS — Z90710 Acquired absence of both cervix and uterus: Secondary | ICD-10-CM | POA: Insufficient documentation

## 2024-04-29 DIAGNOSIS — Z85828 Personal history of other malignant neoplasm of skin: Secondary | ICD-10-CM | POA: Insufficient documentation

## 2024-04-29 DIAGNOSIS — Z95828 Presence of other vascular implants and grafts: Secondary | ICD-10-CM

## 2024-04-29 LAB — CBC WITH DIFFERENTIAL (CANCER CENTER ONLY)
Abs Immature Granulocytes: 0.01 10*3/uL (ref 0.00–0.07)
Basophils Absolute: 0 10*3/uL (ref 0.0–0.1)
Basophils Relative: 1 %
Eosinophils Absolute: 0.2 10*3/uL (ref 0.0–0.5)
Eosinophils Relative: 3 %
HCT: 36.2 % (ref 36.0–46.0)
Hemoglobin: 11.9 g/dL — ABNORMAL LOW (ref 12.0–15.0)
Immature Granulocytes: 0 %
Lymphocytes Relative: 19 %
Lymphs Abs: 1 10*3/uL (ref 0.7–4.0)
MCH: 30.1 pg (ref 26.0–34.0)
MCHC: 32.9 g/dL (ref 30.0–36.0)
MCV: 91.4 fL (ref 80.0–100.0)
Monocytes Absolute: 0.6 10*3/uL (ref 0.1–1.0)
Monocytes Relative: 11 %
Neutro Abs: 3.6 10*3/uL (ref 1.7–7.7)
Neutrophils Relative %: 66 %
Platelet Count: 180 10*3/uL (ref 150–400)
RBC: 3.96 MIL/uL (ref 3.87–5.11)
RDW: 13.6 % (ref 11.5–15.5)
WBC Count: 5.4 10*3/uL (ref 4.0–10.5)
nRBC: 0 % (ref 0.0–0.2)

## 2024-04-29 LAB — CMP (CANCER CENTER ONLY)
ALT: 11 U/L (ref 0–44)
AST: 18 U/L (ref 15–41)
Albumin: 3.8 g/dL (ref 3.5–5.0)
Alkaline Phosphatase: 95 U/L (ref 38–126)
Anion gap: 6 (ref 5–15)
BUN: 11 mg/dL (ref 8–23)
CO2: 31 mmol/L (ref 22–32)
Calcium: 9.5 mg/dL (ref 8.9–10.3)
Chloride: 100 mmol/L (ref 98–111)
Creatinine: 0.55 mg/dL (ref 0.44–1.00)
GFR, Estimated: >60 mL/min (ref >60)
Glucose, Bld: 107 mg/dL — ABNORMAL HIGH (ref 70–99)
Potassium: 3.9 mmol/L (ref 3.5–5.1)
Sodium: 137 mmol/L (ref 135–145)
Total Bilirubin: 0.5 mg/dL (ref 0.0–1.2)
Total Protein: 7.5 g/dL (ref 6.5–8.1)

## 2024-04-29 MED ORDER — SODIUM CHLORIDE 0.9% FLUSH
10.0000 mL | Freq: Once | INTRAVENOUS | Status: AC
  Administered 2024-04-29: 10 mL

## 2024-04-29 MED ORDER — HEPARIN SOD (PORK) LOCK FLUSH 100 UNIT/ML IV SOLN
500.0000 [IU] | Freq: Once | INTRAVENOUS | Status: AC
  Administered 2024-04-29: 500 [IU]

## 2024-04-29 NOTE — Assessment & Plan Note (Signed)
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  -PET scan from June 10, 2023 showed a hypermetabolic 3.1 cm mass in the right upper lobe, and hypermetabolic soft tissue fullness in the pancreatic head, and partially solid and cystic 12.4 cm pelvic mass. 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 Orvil Bland  -baseline CA19.9 33 -Pt has seen by pancreatobiliary surgeon Dr. Cherlynn Cornfield, who recommends neoadjuvant chemotherapy due to the uncertainty of lung metastasis.  Patient declined surgery. -Repeated CT on November 01, 2023 showed improved treated lung mass, but several new or enlarging lung nodules, concerning for metastatic disease.  -Patient is agreeable with chemo, she started gemcitabine  and Abraxane  on 12/04/2023 -CT 03/05/24 showed slightly decreased pancreatic mass, and a radiation changes to her lung lesion.  However she has multiple other new pulmonary nodules on the CT. -Will discuss with her radiation oncologist Dr. Eloise Hake about her pancreatic cancer consolidation radiation

## 2024-04-29 NOTE — Progress Notes (Signed)
 [facility]   [phone] [phone]   Clinic Follow up Note   Patient Care Team: [name], MD as PCP - General (Family Medicine) Quillian Brunt, MD as Consulting Physician (Pulmonary Disease) Marshel Skeeters, OD as Referring Physician (Optometry) Anderson, Dena D, PA-C (Physician Assistant) Sonja Hoisington, MD as Consulting Physician (Hematology and Oncology) Sharyon Deis, NP as Nurse Practitioner (Hematology and Oncology)  Date of Service:  04/29/2024  CHIEF COMPLAINT: f/u of pancreatic and lung cancer   CURRENT THERAPY:  Pending radiation  Oncology History   Pancreatic cancer (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  -PET scan from June 10, 2023 showed a hypermetabolic 3.1 cm mass in the right upper lobe, and hypermetabolic soft tissue fullness in the pancreatic head, and partially solid and cystic 12.4 cm pelvic mass. 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 Orvil Bland  -baseline CA19.9 33 -Pt has seen by pancreatobiliary surgeon Dr. Cherlynn Cornfield, who recommends neoadjuvant chemotherapy due to the uncertainty of lung metastasis.  Patient declined surgery. -Repeated CT on November 01, 2023 showed improved treated lung mass, but several new or enlarging lung nodules, concerning for metastatic disease.  -Patient is agreeable with chemo, she started gemcitabine  and Abraxane  on 12/04/2023 -CT 03/05/24 showed slightly decreased pancreatic mass, and a radiation changes to her lung lesion.  However she has multiple other new pulmonary nodules on the CT. -Will discuss with her radiation oncologist Dr. Eloise Hake about her pancreatic cancer consolidation radiation  Assessment & Plan LLL lung adenocarcinoma  New diagnosis of lung cancer,  distinct from previous pancreatic cancer. Biopsy confirmed primary lung origin. Discussed in thoracic tumor board. Radiation treatment considered viable. Potential bronchoscopy with needle ablation by Dr. Baldwin Levee discussed, though not immediately available. - Coordinate with Dr. Eloise Hake for radiation treatment - Consider bronchoscopy with needle ablation if available  Pancreatic cancer Previously treated with chemotherapy. Current radiation treatment aims to control disease progression and prevent further spread. Chemotherapy and radiation unlikely to cure but may prolong disease control. Open to further treatment options like chemotherapy if cancer progresses or spreads. - Continue radiation treatment - Monitor disease progression with scans every 3-4 months  Oxygen  dependence Oxygen  dependence since chemotherapy, particularly after the fourth cycle. Initially required only at night, now continuous due to chemotherapy effects on respiration.  Plan - I reviewed her recent bronchoscopy and LLL nodule biopsy results, which confirmed adenocarcinoma, consistent with lung primary. - Patient is scheduled for PET scan next week by Dr. Eloise Hake - Patient will see Dr. Eloise Hake after PET scan, to finalize SBRT to lung cancer and consolidation radiation to pancreatic cancer. -f/u and lab in 2 months    SUMMARY OF ONCOLOGIC HISTORY: Oncology History  Adenocarcinoma of upper lobe of right lung (HCC)  08/06/2023 Initial Diagnosis   Adenocarcinoma of upper lobe of right lung (HCC)   08/06/2023 Cancer Staging   Staging form: Lung, AJCC 8th Edition - Clinical: Stage IB (cT2a, cN0, cM0) - Signed by Marlene Simas, MD on 08/06/2023   10/14/2023 Genetic Testing   Negative genetic testing on the CancerNext-Expanded+RNAinsight.  The report date is 10/13/2023.  The CancerNext-Expanded gene panel offered by [facility] and includes sequencing and rearrangement analysis for the following 71 genes: AIP, ALK, APC,  ATM, BAP1, BARD1, BMPR1A, BRCA1,  BRCA2, BRIP1, CDC73, CDH1, CDK4, CDKN1B, CDKN2A, CHEK2, DICER1, FH, FLCN, KIF1B, LZTR1, MAX, MEN1, MET, MLH1, MSH2, MSH6, MUTYH, NF1, NF2, NTHL1, PALB2, PHOX2B, PMS2, POT1, PRKAR1A, PTCH1, PTEN, RAD51C, RAD51D, RB1, RET, SDHA, SDHAF2, SDHB, SDHC, SDHD, SMAD4, SMARCA4, SMARCB1, SMARCE1, STK11, SUFU, TMEM127, TP53, TSC1, TSC2 and VHL (sequencing and deletion/duplication); AXIN2, CTNNA1, EGFR, EGLN1, HOXB13, KIT, MITF, MSH3, PDGFRA, POLD1 and POLE (sequencing only); EPCAM and GREM1 (deletion/duplication only). RNA data is routinely analyzed for use in variant interpretation for all genes.    Pancreatic cancer (HCC)  10/07/2023 Cancer Staging   Staging form: Exocrine Pancreas, AJCC 8th Edition - Clinical stage from 10/07/2023: Stage IB (cT2, cN0, cM0) - Signed by Sonja [place], MD on 10/25/2023 Total positive nodes: 0   10/25/2023 Initial Diagnosis   Pancreatic cancer (HCC)   12/04/2023 -  Chemotherapy   [name] : PANCREATIC Abraxane  D1,8,15 + Gemcitabine  D1,8,15 q28d     03/05/2024 Imaging   CT CAP with contrast IMPRESSION: 1. Slightly diminished size of a hypodense mass of the pancreatic head, consistent with treatment response. No significant change in diffuse pancreatic ductal dilatation and atrophy of the remaining pancreatic parenchyma. 2. Subsolid lung lesion of the peripheral right upper lobe near a fiducial marker is essentially indistinguishable from background on today's examination with new subpleural consolidation consistent with developing radiation fibrosis. Findings consistent with treatment response of the lesion to local radiation therapy. 3. Multiple additional subsolid and predominantly solid nodular opacities, predominantly in the periphery of the right lung, several of which are increased in size and solid character, appearance and behavior most consistent with worsened multifocal pulmonary adenocarcinoma although pulmonary  metastases not generally excluded in the setting of pancreatic adenocarcinoma. 4. Interval enlargement of a cystic lesion with some peripheral soft tissue in the left lower quadrant, of uncertain nature. This is possibly an unusual cystic metastasis or possibly of ovarian origin if there has been ovarian transposition. 5. Severe emphysema.        Discussed the use of AI scribe software for clinical note transcription with the patient, who gave verbal consent to proceed.  History of Present Illness Agustina Witzke Clapper "Benigno Brakeman" is a 78 year old female with pancreatic and lung cancer who presents for follow-up. She was referred by Dr. Willeen Harold PA for a PET scan.  She is undergoing follow-up after chemotherapy, with an improvement in appetite and slight increase in strength. She experiences occasional shooting pain on her side, which is infrequent and brief. There is no significant cough or chest discomfort.  She has completed seven doses of chemotherapy, with the last dose on March 11, 2024. Her breathing worsened after the fourth dose, requiring continuous oxygen  use, whereas previously, she only needed it at night.     All other systems were reviewed with the patient and are negative.  MEDICAL HISTORY:  Past Medical History:  Diagnosis Date   Abnormal PFT 09/02/2014   moderate restrictive ventilatory defect.   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 Fleurette Humbles   Colon polyp 12/2003   Diverticulosis 11/2010   seen on colonoscopy and air contrast BE   Emphysema of lung (HCC)    Family history of breast cancer    GERD (gastroesophageal reflux disease)    Heart valve disease    History of radiation therapy    Right Lung-09/04/23-09/09/23- Dr.  Retta Caster   Hyperlipidemia    Hypertension    previously treated with meds, resolved   MGUS (monoclonal gammopathy of unknown  significance) 1999   prevously under care of Dr. 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   Tobacco abuse    quit in 2019    SURGICAL HISTORY: Past Surgical History:  Procedure Laterality Date   BASAL CELL CARCINOMA EXCISION  11/26/2008   L face, R temple   BILATERAL SALPINGOOPHORECTOMY  11/26/2005   cancerous tumor R ovary   BIOPSY  10/07/2023   Procedure: BIOPSY;  Surgeon: Normie Becton., MD;  Location: [facility];  Service: Gastroenterology;;   BRONCHIAL BIOPSY  07/09/2023   Procedure: BRONCHIAL BIOPSIES;  Surgeon: Denson Flake, MD;  Location: [facility] ENDOSCOPY;  Service: Pulmonary;;   BRONCHIAL BIOPSY  04/07/2024   Procedure: BRONCHOSCOPY, WITH BIOPSY;  Surgeon: Denson Flake, MD;  Location: [facility] ENDOSCOPY;  Service: Pulmonary;;   BRONCHIAL BRUSHINGS  07/09/2023   Procedure: BRONCHIAL BRUSHINGS;  Surgeon: Denson Flake, MD;  Location: [facility] ENDOSCOPY;  Service: Pulmonary;;   BRONCHIAL BRUSHINGS  04/07/2024   Procedure: BRONCHOSCOPY, WITH BRUSH BIOPSY;  Surgeon: Denson Flake, MD;  Location: [facility] ENDOSCOPY;  Service: Pulmonary;;   BRONCHIAL NEEDLE ASPIRATION BIOPSY  07/09/2023   Procedure: BRONCHIAL NEEDLE ASPIRATION BIOPSIES;  Surgeon: Denson Flake, MD;  Location: [facility] ENDOSCOPY;  Service: Pulmonary;;   BRONCHIAL NEEDLE ASPIRATION BIOPSY  04/07/2024   Procedure: BRONCHOSCOPY, WITH NEEDLE ASPIRATION BIOPSY;  Surgeon: Denson Flake, MD;  Location: [facility] ENDOSCOPY;  Service: Pulmonary;;   CATARACT EXTRACTION  11/27/2007   bilateral   COLONOSCOPY  11/26/2010   Dr. Lincoln Renshaw. Diverticulosis. BE also done--normal   ESOPHAGOGASTRODUODENOSCOPY (EGD) WITH PROPOFOL  N/A  10/07/2023   Procedure: ESOPHAGOGASTRODUODENOSCOPY (EGD) WITH PROPOFOL ;  Surgeon: Brice Campi Albino Alu., MD;  Location: [facility] ENDOSCOPY;  Service: Gastroenterology;  Laterality: N/A;   EUS N/A 10/07/2023   Procedure: UPPER ENDOSCOPIC ULTRASOUND (EUS) RADIAL;  Surgeon: Normie Becton., MD;  Location: [facility] ENDOSCOPY;  Service: Gastroenterology;  Laterality: N/A;   FIDUCIAL MARKER PLACEMENT  07/09/2023   Procedure: FIDUCIAL MARKER PLACEMENT;  Surgeon: Denson Flake, MD;  Location: [facility] ENDOSCOPY;  Service: Pulmonary;;   FINE NEEDLE ASPIRATION N/A 10/07/2023   Procedure: FINE NEEDLE ASPIRATION (FNA) LINEAR;  Surgeon: Normie Becton., MD;  Location: [facility];  Service: Gastroenterology;  Laterality: N/A;   OMENTECTOMY  11/26/2005   and pelvic lymphadenectomy (with BSO)   PORTACATH PLACEMENT N/A 11/22/2023   Procedure: INSERTION PORT-A-CATH;  Surgeon: Lockie Rima, MD;  Location: [facility] OR;  Service: General;  Laterality: N/A;   ROBOTIC ASSISTED LAPAROSCOPIC LYSIS OF ADHESION N/A 07/18/2023   Procedure: ROBOTIC ASSISTED PELVIC MASS EXCISION WITH RADICAL DISSECTION, MINI LAPAROTOMY, CYSTOSCOPY;  Surgeon: Suzi Essex, MD;  Location: [facility] ORS;  Service: Gynecology;  Laterality: N/A;   SKIN CANCER EXCISION Left 07/2021   neck; pt can't recall type of cancer   TONSILLECTOMY  age 26   VAGINAL HYSTERECTOMY  late 20's   "didn't want any more children"   VIDEO BRONCHOSCOPY WITH ENDOBRONCHIAL NAVIGATION Bilateral 04/07/2024   Procedure: VIDEO BRONCHOSCOPY WITH ENDOBRONCHIAL NAVIGATION;  Surgeon: Denson Flake, MD;  Location: [facility] ENDOSCOPY;  Service: Pulmonary;  Laterality: Bilateral;  WITH FLUORO    I have reviewed the social history and family history with the patient and they are unchanged from previous note.  ALLERGIES:  has no known allergies.  MEDICATIONS:  Current Outpatient Medications  Medication Sig Dispense Refill   acetaminophen  (TYLENOL ) 500 MG tablet Take 2 tablets (1,000  mg total) by mouth every 8 (eight) hours as needed for mild pain (pain score 1-3).     albuterol  (VENTOLIN  HFA) 108 (90 Base) MCG/ACT inhaler Inhale 2 puffs into the lungs every 4 (four) hours as needed for wheezing or shortness of breath. 6.7 g 0   albuterol  (VENTOLIN  HFA) 108 (90 Base) MCG/ACT inhaler INHALE 2 PUFFS BY MOUTH EVERY 6 HOURS AS NEEDED FOR WHEEZING FOR SHORTNESS OF BREATH . APPOINTMENT REQUIRED FOR FUTURE REFILLS (Patient taking differently: Inhale 2 puffs into the lungs every 6 (six) hours as needed for wheezing or shortness of breath.) 9 g 3   cholecalciferol (VITAMIN D3) 25 MCG (1000 UNIT) tablet Take 1,000 Units by mouth daily.     Coenzyme Q10 (COQ10) 100 MG CAPS Take 100 mg by mouth at bedtime.     lidocaine -prilocaine  (EMLA ) cream Apply 1 Application topically as needed (for port access).     losartan  (COZAAR ) 50 MG tablet Take 1 tablet (50 mg total) by mouth daily. 90 tablet 3   magic mouthwash (nystatin , lidocaine , diphenhydrAMINE, alum & mag hydroxide) suspension Take 5 mLs by mouth 4 (four) times daily as needed for mouth pain. 400 mL 0   mirtazapine  (REMERON ) 7.5 MG tablet Take 1 tablet (7.5 mg total) by mouth at bedtime. 30 tablet 0   ondansetron  (ZOFRAN ) 8 MG tablet Take 1 tablet (8 mg total) by mouth every 8 (eight) hours as needed for nausea or vomiting. 30 tablet 1   prochlorperazine  (COMPAZINE ) 10 MG tablet Take 1 tablet (10 mg total) by mouth every 6 (six) hours as needed for nausea or vomiting. 30 tablet 1   rosuvastatin  (CRESTOR ) 10 MG tablet Take 1 tablet (10 mg total) by mouth at bedtime.     Tiotropium Bromide -Olodaterol (STIOLTO RESPIMAT ) 2.5-2.5 MCG/ACT AERS INHALE 2 PUFFS BY MOUTH ONCE DAILY (Patient taking differently: Inhale 2 puffs into the lungs in the morning.) 4 g 11   No current facility-administered medications for this visit.    PHYSICAL EXAMINATION: ECOG PERFORMANCE STATUS: 2 - Symptomatic, <50% confined to bed  Vitals:   04/29/24 1554  BP:  118/68  Pulse: 87  Resp: 15  Temp: (!) 97.2 F (36.2 C)  SpO2: 96%   Wt Readings from Last 3 Encounters:  04/29/24 113 lb 1.6 oz (51.3 kg)  04/14/24 113 lb 9.6 oz (51.5 kg)  04/07/24 116 lb (52.6 kg)     GENERAL:alert, no distress and comfortable SKIN: skin color, texture, turgor are normal, no rashes or significant lesions EYES: normal, Conjunctiva are pink and non-injected, sclera clear NECK: supple, thyroid  normal size, non-tender, without nodularity LYMPH:  no palpable lymphadenopathy in the cervical, axillary  LUNGS: clear to auscultation and percussion with normal breathing effort HEART: regular rate & rhythm and no murmurs and no lower extremity edema ABDOMEN:abdomen soft, non-tender and normal bowel sounds Musculoskeletal:no cyanosis of digits and no clubbing  NEURO: alert & oriented x 3 with fluent speech, no focal motor/sensory deficits  Physical Exam    LABORATORY DATA:  I have reviewed the data as listed    Latest Ref  Rng & Units 04/29/2024    3:43 PM 04/01/2024   12:41 PM 03/11/2024   10:26 AM  CBC  WBC 4.0 - 10.5 K/uL 5.4  5.9  3.7   Hemoglobin 12.0 - 15.0 g/dL 08.6  57.8  46.9   Hematocrit 36.0 - 46.0 % 36.2  34.4  34.8   Platelets 150 - 400 K/uL 180  284  251         Latest Ref Rng & Units 04/29/2024    3:43 PM 04/01/2024   12:41 PM 03/11/2024   10:26 AM  CMP  Glucose 70 - 99 mg/dL 629  528  413   BUN 8 - 23 mg/dL 11  11  11    Creatinine 0.44 - 1.00 mg/dL 2.44  0.10  2.72   Sodium 135 - 145 mmol/L 137  136  134   Potassium 3.5 - 5.1 mmol/L 3.9  3.8  4.1   Chloride 98 - 111 mmol/L 100  95  96   CO2 22 - 32 mmol/L 31  35  33   Calcium  8.9 - 10.3 mg/dL 9.5  9.3  9.8   Total Protein 6.5 - 8.1 g/dL 7.5  7.3  7.1   Total Bilirubin 0.0 - 1.2 mg/dL 0.5  0.5  0.5   Alkaline Phos 38 - 126 U/L 95  100  92   AST 15 - 41 U/L 18  24  32   ALT 0 - 44 U/L 11  17  31        RADIOGRAPHIC STUDIES: I have personally reviewed the radiological images as listed and  agreed with the findings in the report. No results found.    No orders of the defined types were placed in this encounter.  All questions were answered. The patient knows to call the clinic with any problems, questions or concerns. No barriers to learning was detected. The total time spent in the appointment was 30 minutes, including review of chart and various tests results, discussions about plan of care and coordination of care plan     Sonja [place], MD 04/29/2024

## 2024-05-04 ENCOUNTER — Encounter (HOSPITAL_COMMUNITY)
Admission: RE | Admit: 2024-05-04 | Discharge: 2024-05-04 | Disposition: A | Discharge status: Home / Self Care | Source: Ambulatory Visit | Attending: Radiology | Admitting: Radiology

## 2024-05-04 DIAGNOSIS — C3411 Malignant neoplasm of upper lobe, right bronchus or lung: Secondary | ICD-10-CM | POA: Insufficient documentation

## 2024-05-04 DIAGNOSIS — C250 Malignant neoplasm of head of pancreas: Secondary | ICD-10-CM | POA: Diagnosis not present

## 2024-05-04 DIAGNOSIS — R918 Other nonspecific abnormal finding of lung field: Secondary | ICD-10-CM | POA: Diagnosis not present

## 2024-05-04 LAB — GLUCOSE, CAPILLARY: Glucose-Capillary: 127 mg/dL — ABNORMAL HIGH (ref 70–99)

## 2024-05-04 MED ORDER — FLUDEOXYGLUCOSE F - 18 (FDG) INJECTION
8.0000 | Freq: Once | INTRAVENOUS | Status: AC | PRN
  Administered 2024-05-04: 5.56 via INTRAVENOUS

## 2024-05-06 ENCOUNTER — Other Ambulatory Visit: Payer: Self-pay

## 2024-05-14 ENCOUNTER — Ambulatory Visit: Payer: Self-pay | Admitting: Radiology

## 2024-05-14 NOTE — Progress Notes (Signed)
 Hi ladies,  Dr. Eloise Hake would like this pt to come in and review these results. Could you please schedule a follow-up with us  on a Monday or Thrusday. I am pretty available this afternoon if she is available.  Thanks!

## 2024-05-17 NOTE — Progress Notes (Signed)
 Radiation Oncology         [phone] ________________________________  Name: Tiffany [name] MRN: [id number]  Date: 05/18/2024  DOB: [birth year]  Follow-Up Visit Note  CC: [name], MD  [name], MD  No diagnosis found.  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   Presented with right upper lobe hypermetabolic lung mass as well as an additional suspicious right lower lobe lung nodule that was not hypermetabolic on PET imaging. 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    Presented with right upper lobe hypermetabolic lung mass as well as an additional suspicious right lower lobe lung nodule that was not hypermetabolic on PET imaging. 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 She was last seen here for follow-up on 04/23/24.         To review from her last visit, we discussed the findings from her most recent CT performed on 04/17/24 which showed an interval increase in size of multiple pulmonary nodules, consisting of: an increase in size of the soft tissue density in the RUL measuring 8 mm along with an increase in the soft tissue density along the pleural surface at the  same site; an increase in size of the peripheral parenchymal nodule in the RLL measuring 1.9 x 1.6 cm, previously measuring 1.8 x 1.1 cm; and an increase in size of the medial LUL nodule measuring 3.2 x 1.3 cm, previously 2.4 x 1.2 cm.   Based on the extent of the visualized disease of her CT, we discussed how we would not be able to treat all of the lung nodules with radiation. Given so, a PET scan was recommended for further evaluation.   Subsequent PET scan on 05/04/24 demonstrated mild abnormal radiotracer uptake associated with each of the increasing bilateral pulmonary nodules. The more confluent soft tissue mass at the left lung apex appeared to have the highest level of uptake, with an SUV max of up to 4.3. The pancreatic mass was also demonstrated on this study, and showed less uptake in comparison to her prior PET-CT.    She also followed up with Dr. Lanny prior to her PET scan on 04/29/24. At which time, Dr. Lanny again discussed the role of consolidation radiation therapy for her pancreatic cancer as well as her recommendation of SBRT to the sites in her lung.   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 She initially only required her O2 at night but now wears it continuously.    Allergies:  has no known allergies.  Meds: Current Outpatient Medications  Medication Sig Dispense Refill   acetaminophen  (TYLENOL ) 500 MG tablet Take 2 tablets (1,000 mg total) by mouth every 8 (eight) hours as needed for mild pain (pain score 1-3).  albuterol  (VENTOLIN  HFA) 108 (90 Base) MCG/ACT inhaler Inhale 2 puffs into the lungs every 4 (four) hours as needed for wheezing or shortness of breath. 6.7 g 0   albuterol  (VENTOLIN  HFA) 108 (90 Base) MCG/ACT inhaler INHALE 2 PUFFS BY MOUTH EVERY 6 HOURS AS NEEDED FOR WHEEZING FOR SHORTNESS OF BREATH . APPOINTMENT REQUIRED FOR FUTURE REFILLS (Patient taking differently: Inhale 2 puffs into the lungs every 6 (six) hours  as needed for wheezing or shortness of breath.) 9 g 3   cholecalciferol (VITAMIN D3) 25 MCG (1000 UNIT) tablet Take 1,000 Units by mouth daily.     Coenzyme Q10 (COQ10) 100 MG CAPS Take 100 mg by mouth at bedtime.     lidocaine -prilocaine  (EMLA ) cream Apply 1 Application topically as needed (for port access).     losartan  (COZAAR ) 50 MG tablet Take 1 tablet (50 mg total) by mouth daily. 90 tablet 3   magic mouthwash (nystatin , lidocaine , diphenhydrAMINE, alum & mag hydroxide) suspension Take 5 mLs by mouth 4 (four) times daily as needed for mouth pain. 400 mL 0   mirtazapine  (REMERON ) 7.5 MG tablet Take 1 tablet (7.5 mg total) by mouth at bedtime. 30 tablet 0   ondansetron  (ZOFRAN ) 8 MG tablet Take 1 tablet (8 mg total) by mouth every 8 (eight) hours as needed for nausea or vomiting. 30 tablet 1   prochlorperazine  (COMPAZINE ) 10 MG tablet Take 1 tablet (10 mg total) by mouth every 6 (six) hours as needed for nausea or vomiting. 30 tablet 1   rosuvastatin  (CRESTOR ) 10 MG tablet Take 1 tablet (10 mg total) by mouth at bedtime.     Tiotropium Bromide -Olodaterol (STIOLTO RESPIMAT ) 2.5-2.5 MCG/ACT AERS INHALE 2 PUFFS BY MOUTH ONCE DAILY (Patient taking differently: Inhale 2 puffs into the lungs in the morning.) 4 g 11   No current facility-administered medications for this encounter.    Physical Findings: The patient is in no acute distress. Patient is alert and oriented.  vitals were not taken for this visit. .  No significant changes. Lungs are clear to auscultation bilaterally. Heart has regular rate and rhythm. No palpable cervical, supraclavicular, or axillary adenopathy. Abdomen soft, non-tender, normal bowel sounds.   Lab Findings: Lab Results  Component Value Date   WBC 5.4 04/29/2024   HGB 11.9 (L) 04/29/2024   HCT 36.2 04/29/2024   MCV 91.4 04/29/2024   PLT 180 04/29/2024    Radiographic Findings: NM PET Image Restag (PS) Skull Base To Thigh Result Date: 05/13/2024 CLINICAL DATA:   Subsequent treatment strategy for adenocarcinoma of the right upper lobe of the lung. Malignant neoplasm of head of pancreas. EXAM: NUCLEAR MEDICINE PET SKULL BASE TO THIGH TECHNIQUE: 5.56 mCi F-18 FDG was injected intravenously. Full-ring PET imaging was performed from the skull base to thigh after the radiotracer. CT data was obtained and used for attenuation correction and anatomic localization. Fasting blood glucose: 127 mg/dl COMPARISON:  CT chest without contrast 04/17/2024. Chest abdomen pelvis CT 03/05/2024. PET-CT scan 06/10/2023 FINDINGS: Mediastinal blood pool activity: SUV max 2.0 Liver activity: SUV max 2.4 NECK: No specific abnormal uptake seen in the neck including along lymph nodes of the submandibular, posterior triangle or internal jugular region. Near symmetric uptake of the intracranial compartment. Incidental CT findings: The parotid glands, submandibular glands and thyroid  glands unremarkable. Mild vascular calcifications. CHEST: There is no abnormal uptake above blood pool in the axillary regions or hila. No mediastinal nodal uptake. On the prior CT scan there is some increasing ill-defined density  in the right upper lobe adjacent to fiducial marker. This areas again seen on image 76 of the current CT scan. Uptake in this location is mild and diffuse extending out to the pleura with maximum SUV of only 3.1. Adjacent pleural thickening identified. As seen on the prior CT scan there were several peripheral nodular areas of infiltrative opacity scattered in the right lower lobe. These have a similar configuration to that examination. There is some uptake associated with some of these areas. For example the focus in the lateral aspect of the right lower lobe on image 87 has a focus of uptake of maximum SUV of 3.0. The more posterior focus on image 87 has SUV maximum of 2.2 and the medial focus on image 89 has a central area of uptake of maximum SUV value of 4.0. There is also a spiculated area of  ill-defined opacity in the posterior aspect of the left lower lobe which has maximum SUV value of 3.2 on image 80. This area has been increasing since April and is more confluent today than the more recent study of May 2025. Less interstitial changes and more confluent rounded areas centrally measuring 16 mm. These areas with the appearance on CT in the time course would have a differential including infectious or inflammatory process amongst others. Is also a similar area right upper lobe on image 59. The confluent soft tissue mass however at the medial anterior left upper lobe abutting the mediastinum is more confluent in appearance and demarcated extending out to the pleura. This area on image 51 measures 4.5 x 2.8 cm of the CT scan. This has been increasing since April 2025. That time the area would have measured 2.5 by 1.3 cm. This area has some patchy uptake of maximum SUV of 4.0 superolateral and more inferior medial is a separate area of maximum SUV of 4.3. Incidental CT findings: Emphysematous lung changes. Apical pleural thickening. No pleural effusion. Heart is nonenlarged. Scattered vascular calcifications are seen including along the coronary arteries. Left IJ port in place with tip along the central SVC. ABDOMEN/PELVIS: There is physiologic distribution radiotracer along the parenchymal organs, bowel and renal collecting systems. Exception is some mild uptake in the pancreatic head maximum SUV of 3.5. Previously 5.6. Persistent pancreatic atrophy and ductal dilatation. Incidental CT findings: Breathing motion. Distended gallbladder with gallstones. Diffuse extensive vascular calcifications. Preserved contour to the urinary bladder. No renal or ureteral stones. The left hemi pelvic cystic lesion is stable. Again no abnormal uptake. Scattered stool. Colonic diverticula. No bowel obstruction. SKELETON: No abnormal uptake along the visualized osseous structures. Incidental CT findings: Or curvature and  degenerative changes along the spine. IMPRESSION: Once again there is multifocal peripheral nodular infiltrative opacities scattered in the right lung particularly the right lower lobe with some mild abnormal radiotracer uptake. There is also occlusion of the changes of the similar focus in the left lower lobe which is more confluent but smaller. Uptake up to 4.0 maximum SUV in these areas. Differential exists. Please correlate for any clinical evidence of infectious or inflammatory process and recommend continued close follow-up with CT. The more confluent soft tissue mass at the left lung apex medially and anterior extending to the pleura continues to increase in size and has some areas of uptake up to maximum SUV of 4.3. Again this has a differential but this has more soft tissue masslike appearance on CT scan. Neoplasm is again possible. Persistent mass in the pancreatic head with however less uptake than the  previous PET-CT. Please correlate with known history and workup Electronically Signed   By: Ranell Bring M.D.   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    Presented with right upper lobe hypermetabolic lung mass as well as an additional suspicious right lower lobe lung nodule that was not hypermetabolic on PET imaging. 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  ***  Plan:  ***   *** minutes of total time was spent for this patient encounter, including preparation, face-to-face counseling with the patient and coordination of care, physical exam, and documentation of the encounter. ____________________________________  Lynwood CHARM Nasuti, PhD, MD  This document serves as a record of services personally performed by Lynwood Nasuti, MD. It was  created on his behalf by Dorthy Fuse, a trained medical scribe. The creation of this record is based on the scribe's personal observations and the provider's statements to them. This document has been checked and approved by the attending provider.

## 2024-05-18 ENCOUNTER — Encounter: Payer: Self-pay | Admitting: Radiation Oncology

## 2024-05-18 ENCOUNTER — Ambulatory Visit
Admission: RE | Admit: 2024-05-18 | Discharge: 2024-05-18 | Disposition: A | Discharge status: Home / Self Care | Source: Ambulatory Visit | Attending: Radiation Oncology | Admitting: Radiation Oncology

## 2024-05-18 VITALS — BP 125/66 | HR 74 | Temp 97.6°F | Resp 20 | Ht 65.0 in | Wt 112.6 lb

## 2024-05-18 DIAGNOSIS — Z79899 Other long term (current) drug therapy: Secondary | ICD-10-CM | POA: Diagnosis not present

## 2024-05-18 DIAGNOSIS — C250 Malignant neoplasm of head of pancreas: Secondary | ICD-10-CM | POA: Insufficient documentation

## 2024-05-18 DIAGNOSIS — Z87891 Personal history of nicotine dependence: Secondary | ICD-10-CM | POA: Diagnosis not present

## 2024-05-18 DIAGNOSIS — Z9981 Dependence on supplemental oxygen: Secondary | ICD-10-CM | POA: Diagnosis not present

## 2024-05-18 DIAGNOSIS — K5730 Diverticulosis of large intestine without perforation or abscess without bleeding: Secondary | ICD-10-CM | POA: Diagnosis not present

## 2024-05-18 DIAGNOSIS — C3411 Malignant neoplasm of upper lobe, right bronchus or lung: Secondary | ICD-10-CM | POA: Insufficient documentation

## 2024-05-18 NOTE — Progress Notes (Signed)
 Tiffany [name] is here today for follow up post radiation to the lung.  Lung Side:  Right, patient completed treatmetn on 09/09/23.  Does the patient complain of any of the following: Pain: Reports intermittent discomfort to right chest.  Shortness of breath w/wo exertion: Yes, continues to wear oxygent at 2 liters.  Cough: Yes, productive cough. Hemoptysis: No Pain with swallowing: No Swallowing/choking concerns: No Appetite: Fair, continues to improve.  Energy Level: Fair Post radiation skin Changes: No    Additional comments if applicable:; Patient to receive PET scan.   BP 125/66 (BP Location: Left Arm, Patient Position: Sitting, Cuff Size: Normal)   Pulse 74   Temp 97.6 F (36.4 C)   Resp 20   Ht 5' 5 (1.651 m)   Wt 112 lb 9.6 oz (51.1 kg)   SpO2 97%   PF (!) 2 L/min   BMI 18.74 kg/m

## 2024-05-19 ENCOUNTER — Other Ambulatory Visit: Payer: Self-pay

## 2024-05-20 DIAGNOSIS — J449 Chronic obstructive pulmonary disease, unspecified: Secondary | ICD-10-CM | POA: Diagnosis not present

## 2024-05-20 DIAGNOSIS — J9601 Acute respiratory failure with hypoxia: Secondary | ICD-10-CM | POA: Diagnosis not present

## 2024-05-24 NOTE — Patient Instructions (Incomplete)
  HEALTH MAINTENANCE RECOMMENDATIONS:  It is recommended that you get at least 30 minutes of aerobic exercise at least 5 days/week (for weight loss, you may need as much as 60-90 minutes). This can be any activity that gets your heart rate up. This can be divided in 10-15 minute intervals if needed, but try and build up your endurance at least once a week.  Weight bearing exercise is also recommended twice weekly.  Eat a healthy diet with lots of vegetables, fruits and fiber.  Colorful foods have a lot of vitamins (ie green vegetables, tomatoes, red peppers, etc).  Limit sweet tea, regular sodas and alcoholic beverages, all of which has a lot of calories and sugar.  Up to 1 alcoholic drink daily may be beneficial for women (unless trying to lose weight, watch sugars).  Drink a lot of water .  Calcium  recommendations are 1200-1500 mg daily (1500 mg for postmenopausal women or women without ovaries), and vitamin D  1000 IU daily.  This should be obtained from diet and/or supplements (vitamins), and calcium  should not be taken all at once, but in divided doses.  Monthly self breast exams and yearly mammograms for women over the age of 67 is recommended.  Sunscreen of at least SPF 30 should be used on all sun-exposed parts of the skin when outside between the hours of 10 am and 4 pm (not just when at beach or pool, but even with exercise, golf, tennis, and yard work!)  Use a sunscreen that says broad spectrum so it covers both UVA and UVB rays, and make sure to reapply every 1-2 hours.  Remember to change the batteries in your smoke detectors when changing your clock times in the spring and fall. Carbon monoxide detectors are recommended for your home.  Use your seat belt every time you are in a car, and please drive safely and not be distracted with cell phones and texting while driving.   Please get RSV vaccine from the pharmacy in September. You can get this the same day as your flu shot, or  separate them by 2 weeks. COVID boosters are recommended--updated vaccine should be available in September.  I recommend getting the shingles vaccine (Shingrix). Since you have Medicare, you will need to get this from the pharmacy, as it is covered by Part D. This is a series of 2 injections, spaced 2 months apart.   This should be separated from other vaccines by at least 2 weeks.  Continue to get yearly high dose flu shots in the Fall.  Please try and limit sodas and caffeine.  Try and drink more water . The sodas have sugar, high fructose corn syrup, and caffeine. Consider having an orange with breakfast, and drink water , rather than orange juice.  We discussed trying to get more calcium  from your diet (milk, cheese, yogurt, kale, collards). Consider taking Tums daily as a supplement if you can't get enough calcium  through your diet. Continue vitamin D  daily.

## 2024-05-24 NOTE — Progress Notes (Unsigned)
 No chief complaint on file.  Tiffany [name] is a 78 y.o. female who presents for a complete physical and fasting labs.  She is scheduled for AWV later this month.  Patient has multiple cancer diagnoses, including lung CA (LLL, biopsied 03/2024), pancreatic cancer (found screening CT for lung CA),  and recurrent Granulosa cell tumor. She has received radiation to lung mass, surgical resection of pelvic mass, and chemo for pancreatic cancer (with concern for lung mets).  CT chest 03/2024: IMPRESSION: 1. Progression of disease with increase in multiple pulmonary nodules as well as an increase in soft tissue density at the site of the previously measured fiducial marker. 2. Stable appearance of anterior carinal lymph node. 3. No pleural effusion.  She underwent PET scan earlier in June: IMPRESSION: Once again there is multifocal peripheral nodular infiltrative opacities scattered in the right lung particularly the right lower lobe with some mild abnormal radiotracer uptake. There is also occlusion of the changes of the similar focus in the left lower lobe which is more confluent but smaller. Uptake up to 4.0 maximum SUV in these areas. Differential exists. Please correlate for any clinical evidence of infectious or inflammatory process and recommend continued close follow-up with CT.   The more confluent soft tissue mass at the left lung apex medially and anterior extending to the pleura continues to increase in size and has some areas of uptake up to maximum SUV of 4.3. Again this has a differential but this has more soft tissue masslike appearance on CT scan. Neoplasm is again possible.   Persistent mass in the pancreatic head with however less uptake than the previous PET-CT. Please correlate with known history and workup   She saw Dr. Shannon after PET scan, notes not available. ***UPDATE PLAN--further radiation??    COPD: She remains under the care of Dr. Kassie, compliant  with Stiolto and albuterol  prn. She uses albuterol  once or twice daily with good results.  She gets short of breath with activity (ie stairs). She often takes 2 puffs prior to activity (such as coming to the doctor today, having to walk down a hill).  ***OXYGEN ?? UPDATE   Hypertension. She is compliant with taking losartan  50mg . She denies side effects, headaches, chest pain, palpitations, edema, shortness of breath (other than from COPD).   BP's have been normal at doctors visits.   BP Readings from Last 3 Encounters:  05/18/24 125/66  04/29/24 118/68  04/14/24 132/80      Hyperlipidemia and aortic atherosclerosis, with calcified atherosclerotic plaque in RCA. She denies any angina. She is compliant with rosuvastatin  10mg  and denies side effects.  She has occasional foot cramps. Last lipids were at goal, due for recheck.  Lab Results  Component Value Date   CHOL 140 05/08/2023   HDL 59 05/08/2023   LDLCALC 66 05/08/2023   TRIG 77 05/08/2023   CHOLHDL 2.4 05/08/2023     Pre-diabetes:  Last A1c was 6.4% in 10/2023. She has 1 Dum-dum lollipop after dinner (started when she stopped smoking). +potatoes, spaghetti and mac and cheese occasionally, occasional biscuits.  Hasn't changed her diet in the last year. Mainly drinks water , rare sweet tea. She drinks apple juice or orange juice 1 cup daily with breakfast.    Osteopenia--She previously took bisphosphonates (changed from fosamax  (due to nausea), then both Actonel  and Boniva , which she tolerated but stopped due to cost issues, and possibly had some heartburn).  DEXA was rechecked 09/2022 and was stable, with T-1.9 at L fem  neck (prior was 03/2020, T-2.1 at left fem neck).  Doesn't get any weight-bearing exercise.  Calcium  intake--she doesn't take any supplements, other than the Tums  *** She has whole milk with cereal (1-2x/week), eats macaroni and cheese once a week. No kale, collards. She quit smoking 08/2018. Vitamin D   deficiency has been an ongoing issue (see below).   Vitamin D  deficiency:  Last level was  normal at 39.9 in 09/2022, when taking 1000 IU of D3 daily. She had consistently low levels prior to this. She is still taking D3 1000 IU daily.  Component Ref Range & Units (hover) 1 yr ago (10/22/22) 2 yr ago (04/16/22) 2 yr ago (09/21/21) 3 yr ago (03/08/21) 3 yr ago (08/08/20) 4 yr ago (06/22/19) 5 yr ago (12/03/18)  Vit D, 25-Hydroxy 39.9 24.8 Low  CM 23.3 Low  CM 24.6 Low  CM 23.3 Low  CM 29.1 Low  CM 24.6 Low  CM     Immunization History  Administered Date(s) Administered   Fluad Quad(high Dose 65+) 09/15/2019, 08/08/2020, 09/21/2021, 08/15/2022   Fluad Trivalent(High Dose 65+) 09/11/2023   Influenza Split 10/16/2012   Influenza, High Dose Seasonal PF 09/02/2013, 08/25/2014, 08/31/2015, 11/01/2016, 09/09/2017, 09/09/2018   PFIZER(Purple Top)SARS-COV-2 Vaccination 07/01/2020, 07/22/2020   PNEUMOCOCCAL CONJUGATE-20 05/08/2023   Pneumococcal Conjugate-13 08/25/2014   Pneumococcal Polysaccharide-23 12/12/2009, 08/31/2015   Tdap 12/12/2009   Last Pap smear: s/p hyst in 20's   Last mammogram: 04/2021 Last colonoscopy: 11/2010 Dr. Vicci. She got a letter a couple of years ago stating she was due, but didn't go. She declines repeating colonoscopy (unless she has symptoms). Last DEXA: 09/2022 and was stable, with T-1.9 at L fem neck (prior was 03/2020, T-2.1 at left fem neck).  Dentist: doesn't go, has dentures   Ophtho: yearly Exercise: not much   PMH, PSH, SH and FH reviewed    ROS: The patient denies anorexia, fever, headaches, vision changes, decreased hearing, ear pain, sore throat, breast concerns, chest pain, dizziness, syncope, swelling, nausea, vomiting, diarrhea, constipation, abdominal pain, melena, hematochezia, hematuria, incontinence, dysuria, vaginal bleeding, discharge, odor or itch, genital lesions, weakness, tremor, suspicious skin lesions, depression, anxiety, abnormal  bleeding/bruising, or enlarged lymph nodes.  Some intermittent itchy ears (ongoing/chronic, sometimes wakes her up at night)  SOB only with stairs and carrying groceries uphill, unchanged. Chronic cough -- thick, clear phlegm (less frequent since quitting smoking).  Has heartburn with certain foods, such as pizza. Tums prn helps.  Less often than in the past.  No dysphagia. Occasional L knee pain, mainly with stairs.  Back pain flaring, needing to walk with cane.   PHYSICAL EXAM:  There were no vitals taken for this visit.  Wt Readings from Last 3 Encounters:  05/18/24 112 lb 9.6 oz (51.1 kg)  04/29/24 113 lb 1.6 oz (51.3 kg)  04/14/24 113 lb 9.6 oz (51.5 kg)    General Appearance:   Alert, cooperative, no distress, appears stated age. Speaks easily in full sentences, in no distress. Some intermittent hoarseness which is relieved by throat-clearing. No coughing during visit.  Head:   Normocephalic, without obvious abnormality, atraumatic    Eyes:   PERRL, conjunctiva/corneas clear, EOM's intact, fundi not well visualized  Ears:   Normal TM's and external ear canals  Nose:   Normal, no drainage  Throat:   Upper and lower dentures present. Visualized mucosa is normal  Neck:   Supple, no lymphadenopathy; thyroid : no enlargement/ tenderness/nodules; no carotid bruit or JVD    Back:  Spine nontender,no CVA tenderness. Slight scoliosis of lower spine.  Lungs:   Decreased air movement/distant, but otherwise clear.  No wheezes, rales, ronchi  Chest Wall:   No tenderness or deformity    Heart:   Regular rate and rhythm, S1 and S2 normal, no murmur, rub or gallop    Breast Exam:   No masses or nipple discharge or inversion. No axillary lymphadenopathy. No masses or tenderness.  Abdomen:   Soft, non-tender, nondistended, normoactive bowel sounds, no masses, no hepatosplenomegaly    Genitalia:   Declined by patient   Rectal:   Declined by patient  Extremities:   No clubbing, cyanosis or  edema.  Pulses:   Slightly diminished, symmetric. Feet are warm, brisk cap refill   Skin:   Turgor normal, no rashes. Significant actinic changes to skin. Skin is dry. Area at R wrist has improved (using cream from derm), still somewhat hyperkeratotic, pink.  She has purpura and bruising L forearm and upper inner L arm.  Many hyperkeratotic patches on back, nonpigmented. *** Hyperpigmentation of shins bilaterally  Lymph nodes:   Cervical, supraclavicular, inguinal and axillary nodes normal    Neurologic:   CNII-XII intact, normal strength, sensation and gait; reflexes 2+ and symmetric throughout                        Psych:   Normal mood, affect, hygiene and grooming   ***UPDATE general, lungs, Skin    ASSESSMENT/PLAN:  Did she ever get Tdap, shingrix or RSV vaccines from pharmacy?  Order DEXA for 09/2024 (see where she wants, since can't get at [facility]) Can only have A1c ONCE A YEAR with [employer], had in December Cbc and c-met just done by cancer center. 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 Immunization recommendations discussed-- continue yearly high dose flu shots.   RSV vaccine recommended in the Fall.  Tdap and Shingrix again recommended, to get from pharmacy.  COVID booster recommended, declined.  Colonoscopy recommendations reviewed, declines. Reasonable given age and other cancer diagnoses.  DEXA is due 09/2024  F/u as scheduled for AWV

## 2024-05-25 ENCOUNTER — Ambulatory Visit (INDEPENDENT_AMBULATORY_CARE_PROVIDER_SITE_OTHER): Payer: Medicare Other | Admitting: Family Medicine

## 2024-05-25 ENCOUNTER — Telehealth: Payer: Self-pay | Admitting: *Deleted

## 2024-05-25 ENCOUNTER — Encounter: Payer: Self-pay | Admitting: Family Medicine

## 2024-05-25 VITALS — BP 118/70 | HR 83 | Wt 112.0 lb

## 2024-05-25 DIAGNOSIS — I10 Essential (primary) hypertension: Secondary | ICD-10-CM | POA: Diagnosis not present

## 2024-05-25 DIAGNOSIS — C3490 Malignant neoplasm of unspecified part of unspecified bronchus or lung: Secondary | ICD-10-CM

## 2024-05-25 DIAGNOSIS — Z0000 Encounter for general adult medical examination without abnormal findings: Secondary | ICD-10-CM

## 2024-05-25 DIAGNOSIS — E7800 Pure hypercholesterolemia, unspecified: Secondary | ICD-10-CM

## 2024-05-25 DIAGNOSIS — E559 Vitamin D deficiency, unspecified: Secondary | ICD-10-CM

## 2024-05-25 DIAGNOSIS — R7303 Prediabetes: Secondary | ICD-10-CM | POA: Diagnosis not present

## 2024-05-25 DIAGNOSIS — M8580 Other specified disorders of bone density and structure, unspecified site: Secondary | ICD-10-CM

## 2024-05-25 DIAGNOSIS — I700 Atherosclerosis of aorta: Secondary | ICD-10-CM

## 2024-05-25 DIAGNOSIS — Z5181 Encounter for therapeutic drug level monitoring: Secondary | ICD-10-CM

## 2024-05-25 DIAGNOSIS — J449 Chronic obstructive pulmonary disease, unspecified: Secondary | ICD-10-CM

## 2024-05-25 DIAGNOSIS — I2510 Atherosclerotic heart disease of native coronary artery without angina pectoris: Secondary | ICD-10-CM

## 2024-05-25 DIAGNOSIS — J9601 Acute respiratory failure with hypoxia: Secondary | ICD-10-CM

## 2024-05-25 MED ORDER — LOSARTAN POTASSIUM 50 MG PO TABS: 50.0000 mg | ORAL_TABLET | Freq: Every day | ORAL | 3 refills | Status: AC

## 2024-05-25 NOTE — Telephone Encounter (Signed)
 CALLED PATIENT TO INFORM OF CT FOR 08/18/24- ARRIVAL TIME- 12:45 PM @ [facility], NO RESTRICTIONS TO SCAN, PATIENT TO RECEIVE RESULTS FROM DR. KINARD ON 08-24-24 @ 3:15 PM, SPOKE WITH PATIENT AND SHE IS AWARE OF THESE APPTS. AND THE INSTRUCTIONS

## 2024-05-26 ENCOUNTER — Ambulatory Visit: Payer: Self-pay | Admitting: Family Medicine

## 2024-05-26 LAB — LIPID PANEL
Chol/HDL Ratio: 2.9 ratio (ref 0.0–4.4)
Cholesterol, Total: 152 mg/dL (ref 100–199)
HDL: 53 mg/dL (ref >39)
LDL Chol Calc (NIH): 80 mg/dL (ref 0–99)
Triglycerides: 101 mg/dL (ref 0–149)
VLDL Cholesterol Cal: 19 mg/dL (ref 5–40)

## 2024-06-09 NOTE — Progress Notes (Unsigned)
 No chief complaint on file.  Tiffany [name] is a 78 y.o. female who presents for Medicare wellness visit.   Immunization History  Administered Date(s) Administered   Fluad Quad(high Dose 65+) 09/15/2019, 08/08/2020, 09/21/2021, 08/15/2022   Fluad Trivalent(High Dose 65+) 09/11/2023   Influenza Split 10/16/2012   Influenza, High Dose Seasonal PF 09/02/2013, 08/25/2014, 08/31/2015, 11/01/2016, 09/09/2017, 09/09/2018   PFIZER(Purple Top)SARS-COV-2 Vaccination 07/01/2020, 07/22/2020   PNEUMOCOCCAL CONJUGATE-20 05/08/2023   Pneumococcal Conjugate-13 08/25/2014   Pneumococcal Polysaccharide-23 12/12/2009, 08/31/2015   Tdap 12/12/2009   Last Pap smear: s/p hyst in 20's   Last mammogram: 04/2021 Last colonoscopy: 11/2010 Dr. Vicci. She got a letter a couple of years ago stating she was due, but didn't go. She declines repeating colonoscopy (unless she has symptoms). Last DEXA: 09/2022 and was stable, with T-1.9 at L fem neck (prior was 03/2020, T-2.1 at left fem neck).  Dentist: doesn't go, has dentures   Ophtho: yearly Exercise: not much  Patient Care Team: [name], MD as PCP - General (Family Medicine) Kassie Acquanetta Bradley, MD as Consulting Physician (Pulmonary Disease) Cleotilde Carlin Lenis, OD as Referring Physician (Optometry) Lenon Reda BIRCH PA-C (Physician Assistant) Lanny Callander, MD as Consulting Physician (Hematology and Oncology) Hanford Powell BRAVO, NP as Nurse Practitioner (Hematology and Oncology) GI:  Dr. Vicci [facility])--retired ophtho (specialist): Dr. Shelda longer sees   Depression Screening: Flowsheet Row Office Visit from 05/25/2024 in [facility]  PHQ-2 Total Score 0      Falls screen:     05/25/2024    2:25 PM 04/14/2024    1:34 PM 11/21/2023   11:34 AM 05/22/2023   11:15 AM 04/25/2023   11:24 AM  Fall Risk   Falls in the past year? 0 1 1 1 1   Number falls in past yr: 0 0 0 0 0  Comment    04/14/23 fell outside [place]  04/14/23-fell outside [place]  Injury with Fall? 0 0 1 1 1   Comment    hurt left hip and right shoulder hurt left hip and right shoulder  Risk for fall due to : No Fall Risks Impaired balance/gait;Impaired mobility Impaired balance/gait  History of fall(s)  Follow up Falls evaluation completed  Falls evaluation completed  Falls evaluation completed     Functional Status Survey:         End of Life Discussion:  Patient does not have a living will and medical power of attorney. She was given paperwork in the past, hasn't returned it. She still has it at home.   PMH, PSH, SH and FH were reviewed and updated    ROS: The patient denies anorexia, fever, headaches, vision changes, decreased hearing, ear pain, sore throat, breast concerns, chest pain, dizziness, syncope, swelling, nausea, vomiting, diarrhea, constipation, abdominal pain, melena, hematochezia, hematuria, incontinence, dysuria, vaginal bleeding, discharge, odor or itch, genital lesions, weakness, tremor, suspicious skin lesions, depression, anxiety, abnormal bleeding/bruising, or enlarged lymph nodes.  Denies any rashes. SOB with activities. Chronic cough -- thick, clear phlegm since chemo (improved a lot after quitting smoking) Has heartburn with certain foods, such as pizza. Infrequent, as she avoids these foods.  No dysphagia. Occasional L hip (from a fall). Denies knee or back pain. Has 1 Ensure daily. +weight loss ***    PHYSICAL EXAM:  There were no vitals taken for this visit.  Wt Readings from Last 3 Encounters:  05/25/24 112 lb (50.8 kg)  05/18/24 112 lb 9.6 oz (51.1 kg)  04/29/24 113 lb 1.6 oz (51.3 kg)   Pleasant, elderly female ***  Speaking easily and comfortably. HEENT: conjunctiva and sclera are clear, EOMI Neck: no lymphadenopathy Psych: Normal mood, affect, hygiene, grooming, eye contact and speech. Heart: regular rate and rhythm Lungs: slightly distant, clear. No wheezes, rales or  ronchi Neuro: alert and oriented, cranial nerves grossly intact.  Extremities: ***  ***skin?  Recent labs: Lab Results  Component Value Date   CHOL 152 05/25/2024   HDL 53 05/25/2024   LDLCALC 80 05/25/2024   TRIG 101 05/25/2024   CHOLHDL 2.9 05/25/2024     ASSESSMENT/PLAN:  Needs to schedule DEXA (ordered at recent CPE)  Discussed monthly self breast exams and yearly mammogram (past due); at least 30 minutes of aerobic activity at least 5 days/week, weight-bearing exercise 2x/wk; proper sunscreen use reviewed; healthy diet, including goals of calcium  and vitamin D  intake and alcohol recommendations (less than or equal to 1 drink/day) reviewed; regular seatbelt use; changing batteries in smoke detectors. Immunization recommendations discussed-- continue yearly high dose flu shots.   RSV vaccine recommended in the Fall, to get from pharmacy. Tdap and Shingrix again recommended, to get from pharmacy.  COVID booster recommended, declined.  Encouraged to get updated shot in the Fall Colonoscopy recommendations reviewed, declines. Reasonable given age and other cancer diagnoses.   DEXA is due 09/2024, ordered, not yet scheduled.     MOST form reviewed and signed. Full Code, Full care. Doesn't want prolonged measures.  She would want full initial resuscitation, including intubation. Discussed importance/reasons for completing Living Will and healthcare POA, encouraged to fill out and give us  a copy after notarized.     Medicare Attestation I have personally reviewed: The patient's medical and social history Their use of alcohol, tobacco or illicit drugs Their current medications and supplements The patient's functional ability including ADLs,fall risks, home safety risks, cognitive, and hearing and visual impairment Diet and physical activities Evidence for depression or mood disorders  The patient's weight, height, BMI have been recorded in the chart.  I have made referrals,  counseling, and provided education to the patient based on review of the above and I have provided the patient with a written personalized care plan for preventive services.

## 2024-06-10 ENCOUNTER — Ambulatory Visit (INDEPENDENT_AMBULATORY_CARE_PROVIDER_SITE_OTHER): Payer: Medicare Other | Admitting: Family Medicine

## 2024-06-10 ENCOUNTER — Encounter: Payer: Self-pay | Admitting: Family Medicine

## 2024-06-10 VITALS — BP 132/70 | HR 76 | Ht 66.0 in | Wt 114.2 lb

## 2024-06-10 DIAGNOSIS — I10 Essential (primary) hypertension: Secondary | ICD-10-CM

## 2024-06-10 DIAGNOSIS — Z7189 Other specified counseling: Secondary | ICD-10-CM

## 2024-06-10 DIAGNOSIS — Z0000 Encounter for general adult medical examination without abnormal findings: Secondary | ICD-10-CM

## 2024-06-10 DIAGNOSIS — E7800 Pure hypercholesterolemia, unspecified: Secondary | ICD-10-CM | POA: Diagnosis not present

## 2024-06-10 DIAGNOSIS — R7303 Prediabetes: Secondary | ICD-10-CM

## 2024-06-10 MED ORDER — ROSUVASTATIN CALCIUM 10 MG PO TABS: 10.0000 mg | ORAL_TABLET | Freq: Every day | ORAL | 3 refills | Status: AC

## 2024-06-10 NOTE — Patient Instructions (Addendum)
  Ms. Bourgoin , Thank you for taking time to come for your Medicare Wellness Visit. I appreciate your ongoing commitment to your health goals. Please review the following plan we discussed and let me know if I can assist you in the future.   This is a list of the screening recommended for you and due dates:  Health Maintenance  Topic Date Due   Zoster (Shingles) Vaccine (1 of 2) Never done   COVID-19 Vaccine (3 - Pfizer risk series) 08/19/2020   DTaP/Tdap/Td vaccine (2 - Td or Tdap) 11/20/2024*   Flu Shot  06/26/2024   Medicare Annual Wellness Visit  06/10/2025   Pneumococcal Vaccine for age over 54  Completed   DEXA scan (bone density measurement)  Completed   Hepatitis C Screening  Completed   Hepatitis B Vaccine  Aged Out   HPV Vaccine  Aged Out   Meningitis B Vaccine  Aged Out   Screening for Lung Cancer  Discontinued   Colon Cancer Screening  Discontinued  *Topic was postponed. The date shown is not the original due date.   Please get your tetanus booster from the pharmacy (TdaP).  Please get RSV vaccine from the pharmacy in August/September. You can get this the same day as your flu shot, or separate them by 2 weeks. COVID boosters are recommended--updated vaccine should be available in September.   I recommend getting the shingles vaccine (Shingrix). Since you have Medicare, you will need to get this from the pharmacy, as it is covered by Part D. This is a series of 2 injections, spaced 2 months apart.   This should be separated from other vaccines by at least 2 weeks.   Continue to get yearly high dose flu shots in the Fall.  Remember to call [facility] to schedule your bone density test if they don't call you.  Please bring us  copies of your Living Will and Healthcare Power of Attorney once completed and notarized so that it can be scanned into your medical chart.  Be sure to stay well hydrated.  Use guaifenesin  (in robitussin or Mucinex ) to help loosen the thick  phlegm or mucus.

## 2024-06-11 ENCOUNTER — Other Ambulatory Visit: Payer: Self-pay

## 2024-06-15 ENCOUNTER — Telehealth: Payer: Self-pay

## 2024-06-15 NOTE — Progress Notes (Signed)
   06/15/2024  [name], female   DOB: [birth year], 78 y.o.   MRN: [id number]  Pharmacy Quality Measure Review  This patient is appearing on a report for being at risk of failing the adherence measure for hypertension (ACEi/ARB) medications this calendar year.   Medication: Losartan  50mg  Last fill date: 01/16/24 for 90 day supply  Left voicemail for patient to return my call at their convenience.  Jon VEAR Lindau, PharmD Clinical Pharmacist [phone]

## 2024-06-19 DIAGNOSIS — J449 Chronic obstructive pulmonary disease, unspecified: Secondary | ICD-10-CM | POA: Diagnosis not present

## 2024-06-19 DIAGNOSIS — J9601 Acute respiratory failure with hypoxia: Secondary | ICD-10-CM | POA: Diagnosis not present

## 2024-06-30 ENCOUNTER — Inpatient Hospital Stay: Attending: Gynecologic Oncology

## 2024-06-30 ENCOUNTER — Telehealth: Payer: Self-pay

## 2024-06-30 ENCOUNTER — Inpatient Hospital Stay (HOSPITAL_BASED_OUTPATIENT_CLINIC_OR_DEPARTMENT_OTHER): Attending: Gynecologic Oncology | Admitting: Hematology

## 2024-06-30 ENCOUNTER — Other Ambulatory Visit: Payer: Self-pay

## 2024-06-30 VITALS — BP 114/80 | HR 73 | Temp 98.1°F | Resp 18 | Ht 66.0 in | Wt 112.1 lb

## 2024-06-30 DIAGNOSIS — C250 Malignant neoplasm of head of pancreas: Secondary | ICD-10-CM

## 2024-06-30 DIAGNOSIS — Z860100 Personal history of colon polyps, unspecified: Secondary | ICD-10-CM | POA: Diagnosis not present

## 2024-06-30 DIAGNOSIS — C3412 Malignant neoplasm of upper lobe, left bronchus or lung: Secondary | ICD-10-CM | POA: Insufficient documentation

## 2024-06-30 DIAGNOSIS — R110 Nausea: Secondary | ICD-10-CM | POA: Insufficient documentation

## 2024-06-30 DIAGNOSIS — M8580 Other specified disorders of bone density and structure, unspecified site: Secondary | ICD-10-CM | POA: Diagnosis not present

## 2024-06-30 DIAGNOSIS — Z85828 Personal history of other malignant neoplasm of skin: Secondary | ICD-10-CM | POA: Insufficient documentation

## 2024-06-30 DIAGNOSIS — Z79899 Other long term (current) drug therapy: Secondary | ICD-10-CM | POA: Insufficient documentation

## 2024-06-30 DIAGNOSIS — Z90722 Acquired absence of ovaries, bilateral: Secondary | ICD-10-CM | POA: Diagnosis not present

## 2024-06-30 DIAGNOSIS — Z803 Family history of malignant neoplasm of breast: Secondary | ICD-10-CM | POA: Diagnosis not present

## 2024-06-30 DIAGNOSIS — Z90710 Acquired absence of both cervix and uterus: Secondary | ICD-10-CM | POA: Diagnosis not present

## 2024-06-30 DIAGNOSIS — Z923 Personal history of irradiation: Secondary | ICD-10-CM | POA: Diagnosis not present

## 2024-06-30 DIAGNOSIS — I10 Essential (primary) hypertension: Secondary | ICD-10-CM | POA: Insufficient documentation

## 2024-06-30 DIAGNOSIS — C3411 Malignant neoplasm of upper lobe, right bronchus or lung: Secondary | ICD-10-CM

## 2024-06-30 DIAGNOSIS — R109 Unspecified abdominal pain: Secondary | ICD-10-CM | POA: Diagnosis not present

## 2024-06-30 DIAGNOSIS — Z95828 Presence of other vascular implants and grafts: Secondary | ICD-10-CM

## 2024-06-30 LAB — CBC WITH DIFFERENTIAL (CANCER CENTER ONLY)
Abs Immature Granulocytes: 0.01 K/uL (ref 0.00–0.07)
Basophils Absolute: 0.1 K/uL (ref 0.0–0.1)
Basophils Relative: 1 %
Eosinophils Absolute: 0.1 K/uL (ref 0.0–0.5)
Eosinophils Relative: 1 %
HCT: 36.6 % (ref 36.0–46.0)
Hemoglobin: 12.2 g/dL (ref 12.0–15.0)
Immature Granulocytes: 0 %
Lymphocytes Relative: 8 %
Lymphs Abs: 0.5 K/uL — ABNORMAL LOW (ref 0.7–4.0)
MCH: 29.9 pg (ref 26.0–34.0)
MCHC: 33.3 g/dL (ref 30.0–36.0)
MCV: 89.7 fL (ref 80.0–100.0)
Monocytes Absolute: 0.6 K/uL (ref 0.1–1.0)
Monocytes Relative: 9 %
Neutro Abs: 5 K/uL (ref 1.7–7.7)
Neutrophils Relative %: 81 %
Platelet Count: 211 K/uL (ref 150–400)
RBC: 4.08 MIL/uL (ref 3.87–5.11)
RDW: 15.2 % (ref 11.5–15.5)
WBC Count: 6.2 K/uL (ref 4.0–10.5)
nRBC: 0 % (ref 0.0–0.2)

## 2024-06-30 LAB — CMP (CANCER CENTER ONLY)
ALT: 418 U/L (ref 0–44)
AST: 434 U/L (ref 15–41)
Albumin: 3.6 g/dL (ref 3.5–5.0)
Alkaline Phosphatase: 999 U/L — ABNORMAL HIGH (ref 38–126)
Anion gap: 6 (ref 5–15)
BUN: 15 mg/dL (ref 8–23)
CO2: 31 mmol/L (ref 22–32)
Calcium: 9.1 mg/dL (ref 8.9–10.3)
Chloride: 96 mmol/L — ABNORMAL LOW (ref 98–111)
Creatinine: 0.68 mg/dL (ref 0.44–1.00)
GFR, Estimated: >60 mL/min (ref >60)
Glucose, Bld: 203 mg/dL — ABNORMAL HIGH (ref 70–99)
Potassium: 4.4 mmol/L (ref 3.5–5.1)
Sodium: 133 mmol/L — ABNORMAL LOW (ref 135–145)
Total Bilirubin: 4.4 mg/dL (ref 0.0–1.2)
Total Protein: 7.3 g/dL (ref 6.5–8.1)

## 2024-06-30 MED ORDER — SODIUM CHLORIDE 0.9% FLUSH
10.0000 mL | Freq: Once | INTRAVENOUS | Status: AC
  Administered 2024-06-30: 10 mL

## 2024-06-30 NOTE — Progress Notes (Addendum)
 [facility]   [phone] Fax:([phone]) [phone]   Clinic Follow up Note   Patient Care Team: [name], MD as PCP - General (Family Medicine) Kassie Acquanetta Bradley, MD as Consulting Physician (Pulmonary Disease) Cleotilde Carlin Lenis, OD as Referring Physician (Optometry) Anderson, Dena D, PA-C (Physician Assistant) Lanny Callander, MD as Consulting Physician (Hematology and Oncology) Hanford Powell BRAVO, NP as Nurse Practitioner (Hematology and Oncology)  Date of Service:  06/30/2024  CHIEF COMPLAINT: f/u of pancreatic cancer and lung cancer  CURRENT THERAPY:  Observation  Oncology History   Pancreatic cancer (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  -PET scan from June 10, 2023 showed a hypermetabolic 3.1 cm mass in the right upper lobe, and hypermetabolic soft tissue fullness in the pancreatic head, and partially solid and cystic 12.4 cm pelvic mass. 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 Viktoria  -baseline CA19.9 33 -Pt has seen by pancreatobiliary surgeon Dr. Aron, who recommends neoadjuvant chemotherapy due to the uncertainty of lung metastasis.  Patient declined surgery. -Repeated CT on November 01, 2023 showed improved treated lung mass, but several new or enlarging lung nodules, concerning for metastatic disease.  -Patient is agreeable with chemo, she started gemcitabine  and Abraxane  on 12/04/2023 -CT 03/05/24 showed slightly decreased pancreatic mass, and a radiation changes to her lung lesion.  However she has multiple other new pulmonary nodules on the CT, which are mildly hypermetabolic on PET scan in June 2025 -pt was seen by her radiation oncologist Dr. Shannon about her lung cancer and pancreatic cancer consolidation radiation on  05/18/2024 and decided observation only   Assessment & Plan Pancreatic cancer, head of pancreas Persistent mass in the head of the pancreas with decreased uptake on PET scan, but still positive. Potential for bile duct obstruction leading to jaundice. Liver function tests pending to assess for possible obstruction. No stent has been placed previously. - Monitor liver function tests. If abnormal, consider CT scan to assess for bile duct obstruction. - Discuss potential need for stent placement in the bile duct if obstruction is confirmed.  Lung cancer, left upper lobe and right lower lobe, post-radiation right Multiple nodules in the lung with some areas showing positive uptake on PET scan. Right side changes likely due to previous radiation. Left upper lobe nodules suspicious for cancer. No current symptoms such as pain or respiratory issues. Radiation considered as a potential treatment option, but chemotherapy is not preferred due to previous side effects. - Continue monitoring with PET scans. - Consult with radiation oncology in September for further evaluation and potential treatment options.  Nausea Intermittent nausea managed with prescribed medication, providing some relief. No significant impact on appetite or weight. - Continue current anti-nausea medication as needed.  Dark urine (possible early jaundice) Dark urine noted for the past week, possibly indicating early jaundice. Liver function tests pending to assess for potential bile duct obstruction due to pancreatic cancer. No stent placed previously. - Monitor liver function tests. If abnormal, consider CT scan to assess for bile duct obstruction. - Encourage increased fluid intake to address dark urine.  Plan - She is clinically stable, no significant pain or other new symptoms - CMP today showed total bilirubin 4.4, AST 434, ALT 418, will obtain CT of abdomen pelvis with contrast in next  few days, to rule out biliary obstruction  and liver metastasis.  If she does not have biliary obstruction, will refer to GI for ERCP F/u in 2 weeks    SUMMARY OF ONCOLOGIC HISTORY: Oncology History  Adenocarcinoma of upper lobe of right lung (HCC)  08/06/2023 Initial Diagnosis   Adenocarcinoma of upper lobe of right lung (HCC)   08/06/2023 Cancer Staging   Staging form: Lung, AJCC 8th Edition - Clinical: Stage IB (cT2a, cN0, cM0) - Signed by Sherrod Sherrod, MD on 08/06/2023   10/14/2023 Genetic Testing   Negative genetic testing on the CancerNext-Expanded+RNAinsight.  The report date is 10/13/2023.  The CancerNext-Expanded gene panel offered by [employer] and includes sequencing and rearrangement analysis for the following 71 genes: AIP, ALK, APC, ATM, BAP1, BARD1, BMPR1A, BRCA1, BRCA2, BRIP1, CDC73, CDH1, CDK4, CDKN1B, CDKN2A, CHEK2, DICER1, FH, FLCN, KIF1B, LZTR1, MAX, MEN1, MET, MLH1, MSH2, MSH6, MUTYH, NF1, NF2, NTHL1, PALB2, PHOX2B, PMS2, POT1, PRKAR1A, PTCH1, PTEN, RAD51C, RAD51D, RB1, RET, SDHA, SDHAF2, SDHB, SDHC, SDHD, SMAD4, SMARCA4, SMARCB1, SMARCE1, STK11, SUFU, TMEM127, TP53, TSC1, TSC2 and VHL (sequencing and deletion/duplication); AXIN2, CTNNA1, EGFR, EGLN1, HOXB13, KIT, MITF, MSH3, PDGFRA, POLD1 and POLE (sequencing only); EPCAM and GREM1 (deletion/duplication only). RNA data is routinely analyzed for use in variant interpretation for all genes.    Pancreatic cancer (HCC)  10/07/2023 Cancer Staging   Staging form: Exocrine Pancreas, AJCC 8th Edition - Clinical stage from 10/07/2023: Stage IB (cT2, cN0, cM0) - Signed by Lanny Callander, MD on 10/25/2023 Total positive nodes: 0   10/25/2023 Initial Diagnosis   Pancreatic cancer (HCC)   12/04/2023 -  Chemotherapy   [name] : PANCREATIC Abraxane  D1,8,15 + Gemcitabine  D1,8,15 q28d     03/05/2024 Imaging   CT CAP with contrast IMPRESSION: 1. Slightly diminished size of a hypodense mass of the pancreatic head, consistent with treatment response. No  significant change in diffuse pancreatic ductal dilatation and atrophy of the remaining pancreatic parenchyma. 2. Subsolid lung lesion of the peripheral right upper lobe near a fiducial marker is essentially indistinguishable from background on today's examination with new subpleural consolidation consistent with developing radiation fibrosis. Findings consistent with treatment response of the lesion to local radiation therapy. 3. Multiple additional subsolid and predominantly solid nodular opacities, predominantly in the periphery of the right lung, several of which are increased in size and solid character, appearance and behavior most consistent with worsened multifocal pulmonary adenocarcinoma although pulmonary metastases not generally excluded in the setting of pancreatic adenocarcinoma. 4. Interval enlargement of a cystic lesion with some peripheral soft tissue in the left lower quadrant, of uncertain nature. This is possibly an unusual cystic metastasis or possibly of ovarian origin if there has been ovarian transposition. 5. Severe emphysema.        Discussed the use of AI scribe software for clinical note transcription with the patient, who gave verbal consent to proceed.  History of Present Illness Sebastian Dzik Shimp Tiffany [name] is a 78 year old female with pancreatic and lung cancer who presents for follow-up.  She experiences nausea, which is somewhat alleviated by medication. Her weight remains stable with slight fluctuations, and her energy levels are unchanged. Occasional abdominal pain is described as a 'little ache.' Dark urine has been present for the past week, and she is increasing her water  intake to manage it.  Her pancreatic cancer was discovered on a CT scan conducted for her lung cancer. She has not undergone radiation therapy for the pancreas. A PET  scan from two months ago showed a persistent mass in the head of the pancreas with less uptake than previous scans, but still  positive. She was diagnosed with pancreatic cancer last June or July.  Regarding her lung cancer, the PET scan from two months ago showed multiple nodules in the lungs, with some areas showing uptake suggestive of cancer. She has had radiation on the right side previously, with changes consistent with radiation in that area. Suspicious nodules are present in the left lobe of the lung.     All other systems were reviewed with the patient and are negative.  MEDICAL HISTORY:  Past Medical History:  Diagnosis Date   Abnormal PFT 09/02/2014   moderate restrictive ventilatory defect.   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 Ivin   Colon polyp 12/2003   Diverticulosis 11/2010   seen on colonoscopy and air contrast BE   Emphysema of lung (HCC)    Family history of breast cancer    GERD (gastroesophageal reflux disease)    Heart valve disease    History of radiation therapy    Right Lung-09/04/23-09/09/23- Dr. Lynwood Nasuti   Hyperlipidemia    Hypertension    previously treated with meds, resolved   MGUS (monoclonal gammopathy of unknown significance) 1999   prevously under care of Dr. 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   Tobacco abuse    quit in 2019    SURGICAL HISTORY: Past Surgical History:  Procedure Laterality Date   BASAL CELL CARCINOMA EXCISION  11/26/2008   L face, R temple   BILATERAL SALPINGOOPHORECTOMY  11/26/2005   cancerous tumor R ovary   BIOPSY  10/07/2023   Procedure: BIOPSY;  Surgeon: Wilhelmenia Aloha Raddle., MD;  Location: [facility];  Service: Gastroenterology;;   BRONCHIAL BIOPSY  07/09/2023   Procedure: BRONCHIAL BIOPSIES;  Surgeon: Shelah Lamar RAMAN, MD;  Location: [facility] ENDOSCOPY;  Service: Pulmonary;;   BRONCHIAL BIOPSY  04/07/2024   Procedure: BRONCHOSCOPY, WITH BIOPSY;  Surgeon: Shelah Lamar RAMAN, MD;  Location: [facility] ENDOSCOPY;  Service: Pulmonary;;   BRONCHIAL BRUSHINGS  07/09/2023   Procedure: BRONCHIAL BRUSHINGS;  Surgeon: Shelah Lamar RAMAN, MD;  Location[facility] ENDOSCOPY;  Service: Pulmonary;;   BRONCHIAL BRUSHINGS  04/07/2024   Procedure: BRONCHOSCOPY, WITH BRUSH BIOPSY;  Surgeon: Shelah Lamar RAMAN, MD;  Location: [facility] ENDOSCOPY;  Service: Pulmonary;;   BRONCHIAL NEEDLE ASPIRATION BIOPSY  07/09/2023   Procedure: BRONCHIAL NEEDLE ASPIRATION BIOPSIES;  Surgeon: Shelah Lamar RAMAN, MD;  Location: [facility] ENDOSCOPY;  Service: Pulmonary;;   BRONCHIAL NEEDLE ASPIRATION BIOPSY  04/07/2024   Procedure: BRONCHOSCOPY, WITH NEEDLE ASPIRATION BIOPSY;  Surgeon: Shelah Lamar RAMAN, MD;  Location: [facility] ENDOSCOPY;  Service: Pulmonary;;   CATARACT EXTRACTION  11/27/2007   bilateral   COLONOSCOPY  11/26/2010   Dr. Vicci. Diverticulosis. BE also done--normal   ESOPHAGOGASTRODUODENOSCOPY (EGD) WITH PROPOFOL  N/A 10/07/2023   Procedure: ESOPHAGOGASTRODUODENOSCOPY (EGD) WITH PROPOFOL ;  Surgeon: Wilhelmenia Aloha Raddle., MD;  Location: [facility] ENDOSCOPY;  Service: Gastroenterology;  Laterality: N/A;  EUS N/A 10/07/2023   Procedure: UPPER ENDOSCOPIC ULTRASOUND (EUS) RADIAL;  Surgeon: Wilhelmenia Aloha Raddle., MD;  Location: [facility] ENDOSCOPY;  Service: Gastroenterology;  Laterality: N/A;   FIDUCIAL MARKER PLACEMENT  07/09/2023   Procedure: FIDUCIAL MARKER PLACEMENT;  Surgeon: Shelah Lamar RAMAN, MD;  Location: [facility] ENDOSCOPY;  Service: Pulmonary;;   FINE NEEDLE ASPIRATION N/A 10/07/2023   Procedure: FINE NEEDLE ASPIRATION (FNA) LINEAR;  Surgeon: Wilhelmenia Aloha Raddle., MD;  Location: [facility];  Service: Gastroenterology;  Laterality: N/A;   OMENTECTOMY  11/26/2005    and pelvic lymphadenectomy (with BSO)   PORTACATH PLACEMENT N/A 11/22/2023   Procedure: INSERTION PORT-A-CATH;  Surgeon: Aron Shoulders, MD;  Location: [facility] OR;  Service: General;  Laterality: N/A;   ROBOTIC ASSISTED LAPAROSCOPIC LYSIS OF ADHESION N/A 07/18/2023   Procedure: ROBOTIC ASSISTED PELVIC MASS EXCISION WITH RADICAL DISSECTION, MINI LAPAROTOMY, CYSTOSCOPY;  Surgeon: Viktoria Comer SAUNDERS, MD;  Location: [facility] ORS;  Service: Gynecology;  Laterality: N/A;   SKIN CANCER EXCISION Left 07/2021   neck; pt can't recall type of cancer   TONSILLECTOMY  age 9   VAGINAL HYSTERECTOMY  late 20's   didn't want any more children   VIDEO BRONCHOSCOPY WITH ENDOBRONCHIAL NAVIGATION Bilateral 04/07/2024   Procedure: VIDEO BRONCHOSCOPY WITH ENDOBRONCHIAL NAVIGATION;  Surgeon: Shelah Lamar RAMAN, MD;  Location: [facility] ENDOSCOPY;  Service: Pulmonary;  Laterality: Bilateral;  WITH FLUORO    I have reviewed the social history and family history with the patient and they are unchanged from previous note.  ALLERGIES:  has no known allergies.  MEDICATIONS:  Current Outpatient Medications  Medication Sig Dispense Refill   acetaminophen  (TYLENOL ) 500 MG tablet Take 2 tablets (1,000 mg total) by mouth every 8 (eight) hours as needed for mild pain (pain score 1-3).     albuterol  (VENTOLIN  HFA) 108 (90 Base) MCG/ACT inhaler INHALE 2 PUFFS BY MOUTH EVERY 6 HOURS AS NEEDED FOR WHEEZING FOR SHORTNESS OF BREATH . APPOINTMENT REQUIRED FOR FUTURE REFILLS 9 g 3   cholecalciferol (VITAMIN D3) 25 MCG (1000 UNIT) tablet Take 1,000 Units by mouth daily.     Coenzyme Q10 (COQ10) 100 MG CAPS Take 100 mg by mouth at bedtime.     lidocaine -prilocaine  (EMLA ) cream Apply 1 Application topically as needed (for port access).     losartan  (COZAAR ) 50 MG tablet Take 1 tablet (50 mg total) by mouth daily. 90 tablet 3   magic mouthwash (nystatin , lidocaine , diphenhydrAMINE, alum & mag hydroxide) suspension Take 5 mLs by mouth 4 (four) times daily as  needed for mouth pain. 400 mL 0   mirtazapine  (REMERON ) 7.5 MG tablet Take 1 tablet (7.5 mg total) by mouth at bedtime. (Patient not taking: Reported on 06/10/2024) 30 tablet 0   ondansetron  (ZOFRAN ) 8 MG tablet Take 1 tablet (8 mg total) by mouth every 8 (eight) hours as needed for nausea or vomiting. (Patient not taking: Reported on 06/10/2024) 30 tablet 1   prochlorperazine  (COMPAZINE ) 10 MG tablet Take 1 tablet (10 mg total) by mouth every 6 (six) hours as needed for nausea or vomiting. (Patient not taking: Reported on 06/10/2024) 30 tablet 1   rosuvastatin  (CRESTOR ) 10 MG tablet Take 1 tablet (10 mg total) by mouth at bedtime. 90 tablet 3   Tiotropium Bromide -Olodaterol (STIOLTO RESPIMAT ) 2.5-2.5 MCG/ACT AERS INHALE 2 PUFFS BY MOUTH ONCE DAILY 4 g 11   No current facility-administered medications for this visit.    PHYSICAL EXAMINATION: ECOG PERFORMANCE STATUS: 2 - Symptomatic, <50% confined to bed  Vitals:  06/30/24 1433  BP: 114/80  Pulse: 73  Resp: 18  Temp: 98.1 F (36.7 C)  SpO2: 95%   Wt Readings from Last 3 Encounters:  06/30/24 112 lb 1.6 oz (50.8 kg)  06/10/24 114 lb 3.2 oz (51.8 kg)  05/25/24 112 lb (50.8 kg)     GENERAL:alert, no distress and comfortable SKIN: skin color, texture, turgor are normal, no rashes or significant lesions EYES: normal, Conjunctiva are pink and non-injected, sclera mildly icteric  NECK: supple, thyroid  normal size, non-tender, without nodularity LYMPH:  no palpable lymphadenopathy in the cervical, axillary  LUNGS: clear to auscultation and percussion with normal breathing effort HEART: regular rate & rhythm and no murmurs and no lower extremity edema ABDOMEN:abdomen soft, non-tender and normal bowel sounds Musculoskeletal:no cyanosis of digits and no clubbing  NEURO: alert & oriented x 3 with fluent speech, no focal motor/sensory deficits  Physical Exam    LABORATORY DATA:  I have reviewed the data as listed    Latest Ref Rng &  Units 06/30/2024    2:06 PM 04/29/2024    3:43 PM 04/01/2024   12:41 PM  CBC  WBC 4.0 - 10.5 K/uL 6.2  5.4  5.9   Hemoglobin 12.0 - 15.0 g/dL 87.7  88.0  87.8   Hematocrit 36.0 - 46.0 % 36.6  36.2  34.4   Platelets 150 - 400 K/uL 211  180  284         Latest Ref Rng & Units 04/29/2024    3:43 PM 04/01/2024   12:41 PM 03/11/2024   10:26 AM  CMP  Glucose 70 - 99 mg/dL 892  891  843   BUN 8 - 23 mg/dL 11  11  11    Creatinine 0.44 - 1.00 mg/dL 9.44  9.45  9.44   Sodium 135 - 145 mmol/L 137  136  134   Potassium 3.5 - 5.1 mmol/L 3.9  3.8  4.1   Chloride 98 - 111 mmol/L 100  95  96   CO2 22 - 32 mmol/L 31  35  33   Calcium  8.9 - 10.3 mg/dL 9.5  9.3  9.8   Total Protein 6.5 - 8.1 g/dL 7.5  7.3  7.1   Total Bilirubin 0.0 - 1.2 mg/dL 0.5  0.5  0.5   Alkaline Phos 38 - 126 U/L 95  100  92   AST 15 - 41 U/L 18  24  32   ALT 0 - 44 U/L 11  17  31        RADIOGRAPHIC STUDIES: I have personally reviewed the radiological images as listed and agreed with the findings in the report. No results found.    No orders of the defined types were placed in this encounter.  All questions were answered. The patient knows to call the clinic with any problems, questions or concerns. No barriers to learning was detected. The total time spent in the appointment was 25 minutes, including review of chart and various tests results, discussions about plan of care and coordination of care plan     Onita Mattock, MD 06/30/2024

## 2024-06-30 NOTE — Telephone Encounter (Signed)
 Critical Lab Value:  Tbili 4.4; Alt 418; AST 434  Notified Dr. Lanny and her Team of the Critical.

## 2024-06-30 NOTE — Assessment & Plan Note (Signed)
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  -PET scan from June 10, 2023 showed a hypermetabolic 3.1 cm mass in the right upper lobe, and hypermetabolic soft tissue fullness in the pancreatic head, and partially solid and cystic 12.4 cm pelvic mass. 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 Viktoria  -baseline CA19.9 33 -Pt has seen by pancreatobiliary surgeon Dr. Aron, who recommends neoadjuvant chemotherapy due to the uncertainty of lung metastasis.  Patient declined surgery. -Repeated CT on November 01, 2023 showed improved treated lung mass, but several new or enlarging lung nodules, concerning for metastatic disease.  -Patient is agreeable with chemo, she started gemcitabine  and Abraxane  on 12/04/2023 -CT 03/05/24 showed slightly decreased pancreatic mass, and a radiation changes to her lung lesion.  However she has multiple other new pulmonary nodules on the CT, which are mildly hypermetabolic on PET scan in June 2025 -pt was seen by her radiation oncologist Dr. Shannon about her lung cancer and pancreatic cancer consolidation radiation on 05/18/2024 and decided observation only

## 2024-06-30 NOTE — Addendum Note (Signed)
 Addended by: LANNY CALLANDER on: 06/30/2024 03:43 PM   Modules accepted: Orders

## 2024-07-02 ENCOUNTER — Telehealth: Payer: Self-pay

## 2024-07-02 NOTE — Telephone Encounter (Signed)
 Spoke with pt to confirm the telephone visit scheduled on 07/06/2024 @0745  to discuss pt's CT Scan results.  Pt confirmed appt date and time.

## 2024-07-03 ENCOUNTER — Ambulatory Visit (HOSPITAL_COMMUNITY)
Admission: RE | Admit: 2024-07-03 | Discharge: 2024-07-03 | Disposition: A | Discharge status: Home / Self Care | Source: Ambulatory Visit | Attending: Hematology | Admitting: Hematology

## 2024-07-03 DIAGNOSIS — K8689 Other specified diseases of pancreas: Secondary | ICD-10-CM | POA: Diagnosis not present

## 2024-07-03 DIAGNOSIS — C250 Malignant neoplasm of head of pancreas: Secondary | ICD-10-CM | POA: Insufficient documentation

## 2024-07-03 DIAGNOSIS — K828 Other specified diseases of gallbladder: Secondary | ICD-10-CM | POA: Diagnosis not present

## 2024-07-03 MED ORDER — IOHEXOL 300 MG/ML  SOLN
80.0000 mL | Freq: Once | INTRAMUSCULAR | Status: AC | PRN
  Administered 2024-07-03: 80 mL via INTRAVENOUS

## 2024-07-03 MED ORDER — IOHEXOL 300 MG/ML  SOLN: 100.0000 mL | Freq: Once | INTRAMUSCULAR | Status: DC | PRN

## 2024-07-03 MED ORDER — HEPARIN SOD (PORK) LOCK FLUSH 100 UNIT/ML IV SOLN
500.0000 [IU] | Freq: Once | INTRAVENOUS | Status: AC
  Administered 2024-07-03: 500 [IU] via INTRAVENOUS

## 2024-07-05 NOTE — Assessment & Plan Note (Signed)
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  -PET scan from June 10, 2023 showed a hypermetabolic 3.1 cm mass in the right upper lobe, and hypermetabolic soft tissue fullness in the pancreatic head, and partially solid and cystic 12.4 cm pelvic mass. 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 Viktoria  -baseline CA19.9 33 -Pt has seen by pancreatobiliary surgeon Dr. Aron, who recommends neoadjuvant chemotherapy due to the uncertainty of lung metastasis.  Patient declined surgery. -Repeated CT on November 01, 2023 showed improved treated lung mass, but several new or enlarging lung nodules, concerning for metastatic disease.  -Patient is agreeable with chemo, she started gemcitabine  and Abraxane  on 12/04/2023 -CT 03/05/24 showed slightly decreased pancreatic mass, and a radiation changes to her lung lesion.  However she has multiple other new pulmonary nodules on the CT, which are mildly hypermetabolic on PET scan in June 2025 -pt was seen by her radiation oncologist Dr. Shannon about her lung cancer and pancreatic cancer consolidation radiation on 05/18/2024 and decided observation only

## 2024-07-06 ENCOUNTER — Other Ambulatory Visit: Payer: Self-pay

## 2024-07-06 ENCOUNTER — Inpatient Hospital Stay (HOSPITAL_BASED_OUTPATIENT_CLINIC_OR_DEPARTMENT_OTHER): Admitting: Hematology

## 2024-07-06 DIAGNOSIS — Z860100 Personal history of colon polyps, unspecified: Secondary | ICD-10-CM | POA: Diagnosis not present

## 2024-07-06 DIAGNOSIS — C3412 Malignant neoplasm of upper lobe, left bronchus or lung: Secondary | ICD-10-CM | POA: Diagnosis not present

## 2024-07-06 DIAGNOSIS — C250 Malignant neoplasm of head of pancreas: Secondary | ICD-10-CM

## 2024-07-06 DIAGNOSIS — M8580 Other specified disorders of bone density and structure, unspecified site: Secondary | ICD-10-CM | POA: Diagnosis not present

## 2024-07-06 DIAGNOSIS — K838 Other specified diseases of biliary tract: Secondary | ICD-10-CM

## 2024-07-06 DIAGNOSIS — Z85828 Personal history of other malignant neoplasm of skin: Secondary | ICD-10-CM | POA: Diagnosis not present

## 2024-07-06 DIAGNOSIS — Z90722 Acquired absence of ovaries, bilateral: Secondary | ICD-10-CM | POA: Diagnosis not present

## 2024-07-06 DIAGNOSIS — Z803 Family history of malignant neoplasm of breast: Secondary | ICD-10-CM | POA: Diagnosis not present

## 2024-07-06 DIAGNOSIS — R110 Nausea: Secondary | ICD-10-CM | POA: Diagnosis not present

## 2024-07-06 DIAGNOSIS — I10 Essential (primary) hypertension: Secondary | ICD-10-CM | POA: Diagnosis not present

## 2024-07-06 DIAGNOSIS — Z79899 Other long term (current) drug therapy: Secondary | ICD-10-CM | POA: Diagnosis not present

## 2024-07-06 DIAGNOSIS — R109 Unspecified abdominal pain: Secondary | ICD-10-CM | POA: Diagnosis not present

## 2024-07-06 DIAGNOSIS — C3411 Malignant neoplasm of upper lobe, right bronchus or lung: Secondary | ICD-10-CM | POA: Diagnosis not present

## 2024-07-06 DIAGNOSIS — Z923 Personal history of irradiation: Secondary | ICD-10-CM | POA: Diagnosis not present

## 2024-07-06 DIAGNOSIS — Z90710 Acquired absence of both cervix and uterus: Secondary | ICD-10-CM | POA: Diagnosis not present

## 2024-07-06 NOTE — Progress Notes (Signed)
 [facility]   [phone] [phone]   Clinic Follow up Note   Patient Care Team: [name], MD as PCP - General (Family Medicine) Kassie Acquanetta Bradley, MD as Consulting Physician (Pulmonary Disease) Cleotilde Carlin Lenis, OD as Referring Physician (Optometry) Anderson, Dena D, PA-C (Physician Assistant) Lanny Callander, MD as Consulting Physician (Hematology and Oncology) Hanford Powell BRAVO, NP as Nurse Practitioner (Hematology and Oncology) 07/06/2024  I connected with Tiffany [name] on 07/06/24 at  7:45 AM EDT by telephone and verified that I am speaking with the correct person using two identifiers.   I discussed the limitations, risks, security and privacy concerns of performing an evaluation and management service by telephone and the availability of in person appointments. I also discussed with the patient that there may be a patient responsible charge related to this service. The patient expressed understanding and agreed to proceed.   Patient's location:  Home  Provider's location:  Office    CHIEF COMPLAINT: f/u CT results    CURRENT THERAPY: Supportive care  Oncology history Pancreatic cancer (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  -PET scan from June 10, 2023 showed a hypermetabolic 3.1 cm mass in the right upper lobe, and hypermetabolic soft tissue fullness in the pancreatic head, and partially solid and cystic 12.4 cm pelvic mass. 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 Viktoria  -baseline CA19.9 33 -Pt has seen by pancreatobiliary surgeon Dr. Aron, who recommends neoadjuvant chemotherapy due to the uncertainty of lung metastasis.  Patient declined surgery. -Repeated CT on November 01, 2023 showed improved treated  lung mass, but several new or enlarging lung nodules, concerning for metastatic disease.  -Patient is agreeable with chemo, she started gemcitabine  and Abraxane  on 12/04/2023 -CT 03/05/24 showed slightly decreased pancreatic mass, and a radiation changes to her lung lesion.  However she has multiple other new pulmonary nodules on the CT, which are mildly hypermetabolic on PET scan in June 2025 -pt was seen by her radiation oncologist Dr. Shannon about her lung cancer and pancreatic cancer consolidation radiation on 05/18/2024 and decided observation only  Assessment & Plan Pancreatic cancer with biliary obstruction Pancreatic cancer has not metastasized to the liver but has caused bile duct obstruction, leading to elevated bilirubin levels and jaundice. - Refer to GI for ERCP to place a stent in the bile duct to relieve obstruction. - Coordinate with Dr. Lillian or her partners for scheduling the procedure.  Lung cancer CT scan shows nodules in the lungs, indicating potential worsening of lung cancer. She declines further chemotherapy, prioritizing quality of life and symptom management. - Focus on managing symptoms and improving quality of life. - Refer to palliative care nurse practitioner for symptom management.  Nausea secondary to malignancy Experiencing nausea, likely related to malignancy. - Refer to palliative care nurse practitioner for symptom management. - Discuss home care options for symptom management.   Plan - CT scan reviewed, she has significant biliary obstruction from the pancreas cancer - I will message her GI Dr. 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 - Follow-up with me in 2 to 3 weeks, I  will also refer her to palliative care. - Will likely discuss hospice care on next visit  SUMMARY OF ONCOLOGIC  HISTORY: Oncology History  Adenocarcinoma of upper lobe of right lung (HCC)  08/06/2023 Initial Diagnosis   Adenocarcinoma of upper lobe of right lung (HCC)   08/06/2023 Cancer Staging   Staging form: Lung, AJCC 8th Edition - Clinical: Stage IB (cT2a, cN0, cM0) - Signed by Sherrod Sherrod, MD on 08/06/2023   10/14/2023 Genetic Testing   Negative genetic testing on the CancerNext-Expanded+RNAinsight.  The report date is 10/13/2023.  The CancerNext-Expanded gene panel offered by [employer] and includes sequencing and rearrangement analysis for the following 71 genes: AIP, ALK, APC, ATM, BAP1, BARD1, BMPR1A, BRCA1, BRCA2, BRIP1, CDC73, CDH1, CDK4, CDKN1B, CDKN2A, CHEK2, DICER1, FH, FLCN, KIF1B, LZTR1, MAX, MEN1, MET, MLH1, MSH2, MSH6, MUTYH, NF1, NF2, NTHL1, PALB2, PHOX2B, PMS2, POT1, PRKAR1A, PTCH1, PTEN, RAD51C, RAD51D, RB1, RET, SDHA, SDHAF2, SDHB, SDHC, SDHD, SMAD4, SMARCA4, SMARCB1, SMARCE1, STK11, SUFU, TMEM127, TP53, TSC1, TSC2 and VHL (sequencing and deletion/duplication); AXIN2, CTNNA1, EGFR, EGLN1, HOXB13, KIT, MITF, MSH3, PDGFRA, POLD1 and POLE (sequencing only); EPCAM and GREM1 (deletion/duplication only). RNA data is routinely analyzed for use in variant interpretation for all genes.    Pancreatic cancer (HCC)  10/07/2023 Cancer Staging   Staging form: Exocrine Pancreas, AJCC 8th Edition - Clinical stage from 10/07/2023: Stage IB (cT2, cN0, cM0) - Signed by Lanny Callander, MD on 10/25/2023 Total positive nodes: 0   10/25/2023 Initial Diagnosis   Pancreatic cancer (HCC)   12/04/2023 -  Chemotherapy   [name] : PANCREATIC Abraxane  D1,8,15 + Gemcitabine  D1,8,15 q28d     03/05/2024 Imaging   CT CAP with contrast IMPRESSION: 1. Slightly diminished size of a hypodense mass of the pancreatic head, consistent with treatment response. No significant change in diffuse pancreatic ductal dilatation and atrophy of the remaining pancreatic parenchyma. 2. Subsolid lung  lesion of the peripheral right upper lobe near a fiducial marker is essentially indistinguishable from background on today's examination with new subpleural consolidation consistent with developing radiation fibrosis. Findings consistent with treatment response of the lesion to local radiation therapy. 3. Multiple additional subsolid and predominantly solid nodular opacities, predominantly in the periphery of the right lung, several of which are increased in size and solid character, appearance and behavior most consistent with worsened multifocal pulmonary adenocarcinoma although pulmonary metastases not generally excluded in the setting of pancreatic adenocarcinoma. 4. Interval enlargement of a cystic lesion with some peripheral soft tissue in the left lower quadrant, of uncertain nature. This is possibly an unusual cystic metastasis or possibly of ovarian origin if there has been ovarian transposition. 5. Severe emphysema.       Discussed the use of AI scribe software for clinical note transcription with the patient, who gave verbal consent to proceed.  History of Present Illness Tiffany [name] is a 78 year old female with pancreatic and lung cancer who presents for a virtual visit to follow up a CT scan.  She underwent a CT scan last week due to elevated bilirubin levels. She has dark urine and jaundice. Nausea and reduced appetite are present, for which she takes medication. No significant pain is reported. The CT scan also showed nodules in her lungs.     REVIEW OF SYSTEMS:   Constitutional: Denies fevers, chills or abnormal weight loss Eyes: Denies blurriness of vision Ears, nose, mouth, throat, and face: Denies mucositis or sore throat Respiratory: Denies cough, dyspnea or wheezes Cardiovascular:  Denies palpitation, chest discomfort or lower extremity swelling Gastrointestinal:  Denies nausea, heartburn or change in bowel habits Skin: Denies abnormal skin  rashes Lymphatics: Denies new lymphadenopathy or easy bruising Neurological:Denies numbness, tingling or new weaknesses Behavioral/Psych: Mood is stable, no new changes  All other systems were reviewed with the patient and are negative.  MEDICAL HISTORY:  Past Medical History:  Diagnosis Date   Abnormal PFT 09/02/2014   moderate restrictive ventilatory defect.   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 Ivin   Colon polyp 12/2003   Diverticulosis 11/2010   seen on colonoscopy and air contrast BE   Emphysema of lung (HCC)    Family history of breast cancer    GERD (gastroesophageal reflux disease)    Heart valve disease    History of radiation therapy    Right Lung-09/04/23-09/09/23- Dr. Lynwood Nasuti   Hyperlipidemia    Hypertension    previously treated with meds, resolved   MGUS (monoclonal gammopathy of unknown significance) 1999   prevously under care of Dr. 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;  Service: Pulmonary;;   BRONCHIAL BIOPSY  04/07/2024   Procedure:  BRONCHOSCOPY, WITH BIOPSY;  Surgeon: Shelah Lamar RAMAN, MD;  Location: [facility];  Service: Pulmonary;;   BRONCHIAL BRUSHINGS  07/09/2023   Procedure: BRONCHIAL BRUSHINGS;  Surgeon: Shelah Lamar RAMAN, MD;  Location[facility] ENDOSCOPY;  Service: Pulmonary;;   BRONCHIAL BRUSHINGS  04/07/2024   Procedure: BRONCHOSCOPY, WITH BRUSH BIOPSY;  Surgeon: Shelah Lamar RAMAN, MD;  Location: [facility];  Service: Pulmonary;;   BRONCHIAL NEEDLE ASPIRATION BIOPSY  07/09/2023   Procedure: BRONCHIAL NEEDLE ASPIRATION BIOPSIES;  Surgeon: Shelah Lamar RAMAN, MD;  Location: [facility];  Service: Pulmonary;;   BRONCHIAL NEEDLE ASPIRATION BIOPSY  04/07/2024   Procedure: BRONCHOSCOPY, WITH NEEDLE ASPIRATION BIOPSY;  Surgeon: Shelah Lamar RAMAN, MD;  Location[facility] END[facility]COPY;  Service: Pulmonary;;   CATARACT EXTRACTION  11/27/2007   bilateral   COLONOSCOPY  11/26/2010   Dr. Vicci. Diverticulosis. BE also done--normal   ESOPHAGOGASTRODUODENOSCOPY (EGD) WITH PROPOFOL  N/A 10/07/2023   Procedure: ESOPHAGOGASTRODUODENOSCOPY (EGD) WITH PROPOFOL ;  Surgeon: Wilhelmenia Aloha Raddle., MD;  Location: [facility];  Service: Gastroenterology;  Laterality: N/A;   EUS N/A 10/07/2023   Procedure: UPPER ENDOSCOPIC ULTRASOUND (EUS) RADIAL;  Surgeon: Wilhelmenia Aloha Raddle., MD;  Location: [facility];  Service: Gastroenterology;  Laterality: N/A;   FIDUCIAL MARKER PLACEMENT  07/09/2023   Procedure: FIDUCIAL MARKER PLACEMENT;  Surgeon: Shelah Lamar RAMAN, MD;  Location: [facility];  Service: Pulmonary;;   FINE NEEDLE ASPIRATION N/A 10/07/2023   Procedure: FINE NEEDLE ASPIRATION (FNA) LINEAR;  Surgeon: Wilhelmenia Aloha Raddle., MD;  Location: [facility];  Service: Gastroenterology;  Laterality: N/A;   OMENTECTOMY  11/26/2005   and pelvic lymphadenectomy (with BSO)   PORTACATH PLACEMENT N/A 11/22/2023   Procedure: INSERTION PORT-A-CATH;  Surgeon: Aron Shoulders, MD;  Location: [facility];  Service: General;  Laterality: N/A;   ROBOTIC ASSISTED LAPAROSCOPIC  LYSIS OF ADHESION N/A 07/18/2023   Procedure: ROBOTIC ASSISTED PELVIC MASS EXCISION WITH RADICAL DISSECTION, MINI LAPAROTOMY, CYSTOSCOPY;  Surgeon: Viktoria Comer SAUNDERS, MD;  Location: [facility];  Service: Gynecology;  Laterality: N/A;   SKIN CANCER EXCISION Left 07/2021   neck; pt can't recall type of cancer   TONSILLECTOMY  age 89   VAGINAL HYSTERECTOMY  late 20's   didn't want any more children   VIDEO BRONCHOSCOPY WITH ENDOBRONCHIAL NAVIGATION Bilateral 04/07/2024   Procedure: VIDEO BRONCHOSCOPY WITH ENDOBRONCHIAL NAVIGATION;  Surgeon: Shelah Lamar RAMAN, MD;  Location: [facility];  Service: Pulmonary;  Laterality: Bilateral;  WITH FLUORO    I have reviewed the social history and family history with the patient and they are unchanged from previous note.  ALLERGIES:  has no known allergies.  MEDICATIONS:  Current Outpatient Medications  Medication Sig Dispense Refill   acetaminophen  (TYLENOL ) 500 MG tablet Take 2 tablets (1,000 mg total) by mouth every 8 (eight) hours as needed for mild pain (pain score 1-3).     albuterol  (VENTOLIN  HFA) 108 (90 Base) MCG/ACT inhaler INHALE 2 PUFFS BY MOUTH EVERY 6 HOURS AS NEEDED FOR WHEEZING FOR SHORTNESS OF BREATH . APPOINTMENT REQUIRED FOR FUTURE REFILLS 9 g 3   cholecalciferol (VITAMIN D3) 25 MCG (1000 UNIT) tablet Take 1,000 Units by mouth daily.     Coenzyme Q10 (COQ10) 100 MG CAPS Take 100 mg by mouth at bedtime.     lidocaine -prilocaine  (EMLA ) cream Apply 1 Application topically as needed (for port access).     losartan  (COZAAR ) 50 MG tablet Take 1 tablet (50 mg total) by mouth daily. 90 tablet 3   magic mouthwash (nystatin , lidocaine , diphenhydrAMINE , alum & mag hydroxide) suspension Take 5 mLs by mouth 4 (four) times daily as needed for mouth pain. 400 mL 0   mirtazapine  (REMERON ) 7.5 MG tablet Take 1 tablet (7.5 mg total) by mouth at bedtime. (Patient not taking: Reported on 06/10/2024) 30 tablet 0   ondansetron  (ZOFRAN ) 8 MG tablet Take 1 tablet (8  mg total) by mouth every 8 (eight) hours as needed for nausea or vomiting. (Patient not taking: Reported on 06/10/2024) 30 tablet 1   prochlorperazine  (COMPAZINE ) 10 MG tablet Take 1 tablet (10 mg total) by mouth every 6 (six) hours as needed for nausea or vomiting. (Patient not taking: Reported on 06/10/2024) 30 tablet 1   rosuvastatin  (CRESTOR ) 10 MG tablet Take 1 tablet (10 mg total) by mouth at bedtime. 90 tablet 3   Tiotropium Bromide -Olodaterol (STIOLTO RESPIMAT ) 2.5-2.5 MCG/ACT AERS INHALE 2 PUFFS BY MOUTH ONCE DAILY 4 g 11   No current facility-administered medications for this visit.    PHYSICAL EXAMINATION: Not performed   LABORATORY DATA:  I have reviewed the data as listed    Latest Ref Rng & Units 06/30/2024    2:06 PM 04/29/2024    3:43 PM 04/01/2024   12:41 PM  CBC  WBC 4.0 - 10.5 K/uL 6.2  5.4  5.9   Hemoglobin 12.0 - 15.0 g/dL 12.2  11.9  12.1   Hematocrit 36.0 - 46.0 % 36.6  36.2  34.4   Platelets 150 - 400 K/uL 211  180  284         Latest Ref Rng & Units 06/30/2024    2:06 PM 04/29/2024    3:43 PM 04/01/2024   12:41 PM  CMP  Glucose 70 - 99 mg/dL 796  892  891   BUN 8 - 23 mg/dL 15  11  11    Creatinine 0.44 - 1.00 mg/dL 9.31  9.44  9.45   Sodium 135 - 145 mmol/L 133  137  136   Potassium 3.5 - 5.1 mmol/L 4.4  3.9  3.8   Chloride 98 - 111 mmol/L 96  100  95   CO2 22 - 32 mmol/L 31  31  35   Calcium  8.9 - 10.3 mg/dL 9.1  9.5  9.3   Total Protein 6.5 - 8.1 g/dL 7.3  7.5  7.3   Total Bilirubin 0.0 - 1.2 mg/dL 4.4  0.5  0.5   Alkaline Phos 38 - 126 U/L 999  95  100   AST 15 - 41 U/L 434  18  24   ALT 0 - 44 U/L 418  11  17       RADIOGRAPHIC STUDIES: I have personally reviewed the radiological images as listed and agreed with the findings in the report. No results found.     I discussed the assessment and treatment plan with the patient. The patient was provided an opportunity to ask questions and all were answered. The patient agreed with the plan and  demonstrated an understanding of the instructions.   The patient was advised to call back or seek an in-person evaluation if the symptoms worsen or if the condition fails to improve as anticipated.  I provided 25 minutes of non face-to-face telephone visit time during this encounter, including review of chart and various tests results, discussions about plan of care and coordination of care plan.    Onita Mattock, MD 07/06/24

## 2024-07-08 ENCOUNTER — Telehealth: Payer: Self-pay | Admitting: Hematology

## 2024-07-08 ENCOUNTER — Ambulatory Visit

## 2024-07-08 ENCOUNTER — Encounter
Admission: RE | Disposition: A | Discharge status: Home / Self Care | Payer: Self-pay | Source: Home / Self Care | Attending: Student

## 2024-07-08 ENCOUNTER — Encounter: Payer: Self-pay | Admitting: Gastroenterology

## 2024-07-08 ENCOUNTER — Inpatient Hospital Stay
Admission: RE | Admit: 2024-07-08 | Discharge: 2024-07-11 | DRG: 445 | Disposition: A | Discharge status: Home / Self Care | Attending: Student | Admitting: Student

## 2024-07-08 DIAGNOSIS — K219 Gastro-esophageal reflux disease without esophagitis: Secondary | ICD-10-CM | POA: Diagnosis not present

## 2024-07-08 DIAGNOSIS — C7800 Secondary malignant neoplasm of unspecified lung: Secondary | ICD-10-CM | POA: Diagnosis not present

## 2024-07-08 DIAGNOSIS — Z7951 Long term (current) use of inhaled steroids: Secondary | ICD-10-CM

## 2024-07-08 DIAGNOSIS — Z8249 Family history of ischemic heart disease and other diseases of the circulatory system: Secondary | ICD-10-CM | POA: Diagnosis not present

## 2024-07-08 DIAGNOSIS — I10 Essential (primary) hypertension: Secondary | ICD-10-CM | POA: Diagnosis not present

## 2024-07-08 DIAGNOSIS — C259 Malignant neoplasm of pancreas, unspecified: Secondary | ICD-10-CM | POA: Diagnosis not present

## 2024-07-08 DIAGNOSIS — D472 Monoclonal gammopathy: Secondary | ICD-10-CM | POA: Diagnosis present

## 2024-07-08 DIAGNOSIS — Z923 Personal history of irradiation: Secondary | ICD-10-CM

## 2024-07-08 DIAGNOSIS — K831 Obstruction of bile duct: Secondary | ICD-10-CM | POA: Diagnosis not present

## 2024-07-08 DIAGNOSIS — E785 Hyperlipidemia, unspecified: Secondary | ICD-10-CM | POA: Diagnosis present

## 2024-07-08 DIAGNOSIS — Z803 Family history of malignant neoplasm of breast: Secondary | ICD-10-CM

## 2024-07-08 DIAGNOSIS — Z800 Family history of malignant neoplasm of digestive organs: Secondary | ICD-10-CM | POA: Diagnosis not present

## 2024-07-08 DIAGNOSIS — C240 Malignant neoplasm of extrahepatic bile duct: Secondary | ICD-10-CM | POA: Diagnosis not present

## 2024-07-08 DIAGNOSIS — Z801 Family history of malignant neoplasm of trachea, bronchus and lung: Secondary | ICD-10-CM | POA: Diagnosis not present

## 2024-07-08 DIAGNOSIS — Z860100 Personal history of colon polyps, unspecified: Secondary | ICD-10-CM | POA: Diagnosis not present

## 2024-07-08 DIAGNOSIS — C3411 Malignant neoplasm of upper lobe, right bronchus or lung: Principal | ICD-10-CM

## 2024-07-08 DIAGNOSIS — Z833 Family history of diabetes mellitus: Secondary | ICD-10-CM

## 2024-07-08 DIAGNOSIS — Z79899 Other long term (current) drug therapy: Secondary | ICD-10-CM

## 2024-07-08 DIAGNOSIS — E871 Hypo-osmolality and hyponatremia: Secondary | ICD-10-CM | POA: Diagnosis not present

## 2024-07-08 DIAGNOSIS — K838 Other specified diseases of biliary tract: Secondary | ICD-10-CM | POA: Diagnosis not present

## 2024-07-08 DIAGNOSIS — Z85828 Personal history of other malignant neoplasm of skin: Secondary | ICD-10-CM | POA: Diagnosis not present

## 2024-07-08 DIAGNOSIS — F17290 Nicotine dependence, other tobacco product, uncomplicated: Secondary | ICD-10-CM | POA: Diagnosis present

## 2024-07-08 DIAGNOSIS — Z823 Family history of stroke: Secondary | ICD-10-CM | POA: Diagnosis not present

## 2024-07-08 DIAGNOSIS — J439 Emphysema, unspecified: Secondary | ICD-10-CM | POA: Diagnosis not present

## 2024-07-08 DIAGNOSIS — I2510 Atherosclerotic heart disease of native coronary artery without angina pectoris: Secondary | ICD-10-CM | POA: Diagnosis not present

## 2024-07-08 DIAGNOSIS — C3490 Malignant neoplasm of unspecified part of unspecified bronchus or lung: Secondary | ICD-10-CM | POA: Diagnosis present

## 2024-07-08 DIAGNOSIS — Z515 Encounter for palliative care: Secondary | ICD-10-CM | POA: Diagnosis not present

## 2024-07-08 DIAGNOSIS — C250 Malignant neoplasm of head of pancreas: Secondary | ICD-10-CM | POA: Diagnosis not present

## 2024-07-08 DIAGNOSIS — E876 Hypokalemia: Secondary | ICD-10-CM | POA: Diagnosis present

## 2024-07-08 DIAGNOSIS — J449 Chronic obstructive pulmonary disease, unspecified: Secondary | ICD-10-CM

## 2024-07-08 HISTORY — DX: Other specified diseases of pancreas: K86.89

## 2024-07-08 HISTORY — PX: ERCP: SHX5425

## 2024-07-08 LAB — COMPREHENSIVE METABOLIC PANEL WITH GFR
ALT: 447 U/L — ABNORMAL HIGH (ref 0–44)
AST: 501 U/L — ABNORMAL HIGH (ref 15–41)
Albumin: 2.5 g/dL — ABNORMAL LOW (ref 3.5–5.0)
Alkaline Phosphatase: 940 U/L — ABNORMAL HIGH (ref 38–126)
Anion gap: 9 (ref 5–15)
BUN: 18 mg/dL (ref 8–23)
CO2: 26 mmol/L (ref 22–32)
Calcium: 8.7 mg/dL — ABNORMAL LOW (ref 8.9–10.3)
Chloride: 97 mmol/L — ABNORMAL LOW (ref 98–111)
Creatinine, Ser: 0.53 mg/dL (ref 0.44–1.00)
GFR, Estimated: >60 mL/min (ref >60)
Glucose, Bld: 116 mg/dL — ABNORMAL HIGH (ref 70–99)
Potassium: 3.5 mmol/L (ref 3.5–5.1)
Sodium: 132 mmol/L — ABNORMAL LOW (ref 135–145)
Total Bilirubin: 20.3 mg/dL (ref 0.0–1.2)
Total Protein: 6.7 g/dL (ref 6.5–8.1)

## 2024-07-08 LAB — CBC WITH DIFFERENTIAL/PLATELET
Abs Immature Granulocytes: 0.02 K/uL (ref 0.00–0.07)
Basophils Absolute: 0 K/uL (ref 0.0–0.1)
Basophils Relative: 0 %
Eosinophils Absolute: 0 K/uL (ref 0.0–0.5)
Eosinophils Relative: 0 %
HCT: 33.9 % — ABNORMAL LOW (ref 36.0–46.0)
Hemoglobin: 11.8 g/dL — ABNORMAL LOW (ref 12.0–15.0)
Immature Granulocytes: 0 %
Lymphocytes Relative: 7 %
Lymphs Abs: 0.5 K/uL — ABNORMAL LOW (ref 0.7–4.0)
MCH: 30.4 pg (ref 26.0–34.0)
MCHC: 34.8 g/dL (ref 30.0–36.0)
MCV: 87.4 fL (ref 80.0–100.0)
Monocytes Absolute: 0.7 K/uL (ref 0.1–1.0)
Monocytes Relative: 10 %
Neutro Abs: 5.7 K/uL (ref 1.7–7.7)
Neutrophils Relative %: 83 %
Platelets: 242 K/uL (ref 150–400)
RBC: 3.88 MIL/uL (ref 3.87–5.11)
RDW: 17.7 % — ABNORMAL HIGH (ref 11.5–15.5)
WBC: 7 K/uL (ref 4.0–10.5)
nRBC: 0 % (ref 0.0–0.2)

## 2024-07-08 SURGERY — ERCP, WITH INTERVENTION IF INDICATED
Anesthesia: General

## 2024-07-08 MED ORDER — GLUCAGON HCL (RDNA) 1 MG IJ SOLR
1.0000 mg | Freq: Once | INTRAMUSCULAR | Status: AC | PRN
  Administered 2024-07-08 (×2): .5 mg via INTRAVENOUS
  Filled 2024-07-08: qty 1

## 2024-07-08 MED ORDER — SODIUM CHLORIDE 0.9 % IV SOLN: INTRAVENOUS | Status: DC

## 2024-07-08 MED ORDER — DICLOFENAC SUPPOSITORY 100 MG
100.0000 mg | Freq: Once | RECTAL | Status: AC
  Administered 2024-07-08 (×2): 100 mg via RECTAL

## 2024-07-08 MED ORDER — ONDANSETRON HCL 4 MG PO TABS
4.0000 mg | ORAL_TABLET | Freq: Four times a day (QID) | ORAL | Status: DC | PRN
  Administered 2024-07-10: 4 mg via ORAL
  Filled 2024-07-08: qty 1

## 2024-07-08 MED ORDER — FENTANYL CITRATE (PF) 100 MCG/2ML IJ SOLN
INTRAMUSCULAR | Status: AC
  Filled 2024-07-08: qty 2

## 2024-07-08 MED ORDER — ARFORMOTEROL TARTRATE 15 MCG/2ML IN NEBU
15.0000 ug | INHALATION_SOLUTION | Freq: Two times a day (BID) | RESPIRATORY_TRACT | Status: DC
  Administered 2024-07-09 – 2024-07-11 (×5): 15 ug via RESPIRATORY_TRACT
  Filled 2024-07-08 (×6): qty 2

## 2024-07-08 MED ORDER — LIDOCAINE HCL (PF) 2 % IJ SOLN
INTRAMUSCULAR | Status: AC
  Filled 2024-07-08: qty 5

## 2024-07-08 MED ORDER — UMECLIDINIUM BROMIDE 62.5 MCG/ACT IN AEPB
1.0000 | INHALATION_SPRAY | Freq: Every day | RESPIRATORY_TRACT | Status: DC
  Administered 2024-07-09 – 2024-07-11 (×3): 1 via RESPIRATORY_TRACT
  Filled 2024-07-08: qty 7

## 2024-07-08 MED ORDER — ONDANSETRON HCL 4 MG/2ML IJ SOLN: 4.0000 mg | Freq: Four times a day (QID) | INTRAMUSCULAR | Status: DC | PRN

## 2024-07-08 MED ORDER — LACTATED RINGERS IV SOLN: INTRAVENOUS | Status: DC

## 2024-07-08 MED ORDER — SODIUM CHLORIDE 0.9 % IV SOLN: INTRAVENOUS | Status: AC

## 2024-07-08 MED ORDER — MIDAZOLAM HCL 2 MG/2ML IJ SOLN
INTRAMUSCULAR | Status: AC
  Filled 2024-07-08: qty 2

## 2024-07-08 MED ORDER — DICLOFENAC SUPPOSITORY 100 MG
RECTAL | Status: AC
  Filled 2024-07-08: qty 1

## 2024-07-08 MED ORDER — MIDAZOLAM HCL 2 MG/2ML IJ SOLN
INTRAMUSCULAR | Status: DC | PRN
  Administered 2024-07-08 (×2): 2 mg via INTRAVENOUS

## 2024-07-08 MED ORDER — GLUCAGON HCL RDNA (DIAGNOSTIC) 1 MG IJ SOLR
INTRAMUSCULAR | Status: AC
  Filled 2024-07-08: qty 1

## 2024-07-08 MED ORDER — PROPOFOL 500 MG/50ML IV EMUL
INTRAVENOUS | Status: DC | PRN
  Administered 2024-07-08: 100 mg via INTRAVENOUS
  Administered 2024-07-08: 100 ug/kg/min via INTRAVENOUS
  Administered 2024-07-08: 100 mg via INTRAVENOUS
  Administered 2024-07-08: 100 ug/kg/min via INTRAVENOUS

## 2024-07-08 MED ORDER — HEPARIN SODIUM (PORCINE) 5000 UNIT/ML IJ SOLN
5000.0000 [IU] | Freq: Three times a day (TID) | INTRAMUSCULAR | Status: DC
  Administered 2024-07-08 – 2024-07-11 (×7): 5000 [IU] via SUBCUTANEOUS
  Filled 2024-07-08 (×6): qty 1

## 2024-07-08 MED ORDER — ALBUTEROL SULFATE (2.5 MG/3ML) 0.083% IN NEBU: 2.5000 mg | INHALATION_SOLUTION | RESPIRATORY_TRACT | Status: DC | PRN

## 2024-07-08 MED ORDER — LIDOCAINE HCL (CARDIAC) PF 100 MG/5ML IV SOSY
PREFILLED_SYRINGE | INTRAVENOUS | Status: DC | PRN
  Administered 2024-07-08 (×2): 100 mg via INTRAVENOUS

## 2024-07-08 NOTE — Anesthesia Preprocedure Evaluation (Addendum)
 Anesthesia Evaluation  Patient identified by MRN, date of birth, ID band Patient awake    Reviewed: Allergy & Precautions, NPO status , Patient's Chart, lab work & pertinent test results  History of Anesthesia Complications Negative for: history of anesthetic complications  Airway Mallampati: II  TM Distance: >3 FB Neck ROM: full    Dental  (+) Upper Dentures, Lower Dentures   Pulmonary COPD, former smoker   Pulmonary exam normal        Cardiovascular hypertension, On Medications + CAD  Normal cardiovascular exam     Neuro/Psych negative neurological ROS  negative psych ROS   GI/Hepatic Neg liver ROS,GERD  ,,  Endo/Other  negative endocrine ROS    Renal/GU negative Renal ROS  negative genitourinary   Musculoskeletal   Abdominal   Peds  Hematology negative hematology ROS (+)   Anesthesia Other Findings Past Medical History: 09/02/2014: Abnormal PFT     Comment:  moderate restrictive ventilatory defect. No date: Adenocarcinoma [facility])     Comment:  07/09/23 right lung, s/p SBRT; 10/07/23 pancreatic head               lesion No date: Arthritis     Comment:  hip 2010: BCC (basal cell carcinoma), face     Comment:  Dr. Ivin 12/2003: Colon polyp 11/2010: Diverticulosis     Comment:  seen on colonoscopy and air contrast BE No date: Emphysema of lung (HCC) No date: Family history of breast cancer No date: GERD (gastroesophageal reflux disease) No date: Heart valve disease No date: History of radiation therapy     Comment:  Right Lung-09/04/23-09/09/23- Dr. Lynwood Nasuti No date: Hyperlipidemia No date: Hypertension     Comment:  previously treated with meds, resolved 1999: MGUS (monoclonal gammopathy of unknown significance)     Comment:  prevously under care of Dr. Deanne No date: On home oxygen  therapy     Comment:  2 1/2L/[place] No date: Osteopenia     Comment:  DEX[facility] 01/31/2010 at [facility]; 2014 [facility] 2007: Ovarian mass     Comment:  left; Stage 1A granulosa cell tumor, s/p BSO, pelvic               lymphadenectomy, omentectomy 03/05/06; recurrence, s/p               robotic radical dissection pelvic mass 07/18/23 No date: Pneumonia 02/2022: Squamous cell cancer of skin of forearm, left No date: Tobacco abuse     Comment:  quit in 2019  Past Surgical History: 11/26/2008: BASAL CELL CARCINOMA EXCISION     Comment:  L face, R temple 11/26/2005: BILATERAL SALPINGOOPHORECTOMY     Comment:  cancerous tumor R ovary 10/07/2023: BIOPSY     Comment:  Procedure: BIOPSY;  Surgeon: Wilhelmenia Aloha Raddle.,               MD;  Location: [facility];  Service: Gastroenterology;; 07/09/2023: BRONCHIAL BIOPSY     Comment:  Procedure: BRONCHIAL BIOPSIES;  Surgeon: Shelah Lamar RAMAN, MD;  Location: [facility] ENDOSCOPY;  Service: Pulmonary;; 04/07/2024: BRONCHIAL BIOPSY     Comment:  Procedure: BRONCHOSCOPY, WITH BIOPSY;  Surgeon: Shelah Lamar RAMAN, MD;  Location: [facility];  Service:               Pulmonary;; 07/09/2023: BRONCHIAL BRUSHINGS  Comment:  Procedure: BRONCHIAL BRUSHINGS;  Surgeon: Shelah Lamar RAMAN, MD;  Location: [facility] ENDOSCOPY;  Service: Pulmonary;; 04/07/2024: BRONCHIAL BRUSHINGS     Comment:  Procedure: BRONCHOSCOPY, WITH BRUSH BIOPSY;  Surgeon:               Shelah Lamar RAMAN, MD;  Location: [facility] END[facility];  Service:               Pulmonary;; 07/09/2023: BRONCHIAL NEEDLE ASPIRATION BIOPSY     Comment:  Procedure: BRONCHIAL NEEDLE ASPIRATION BIOPSIES;                Surgeon: Shelah Lamar RAMAN, MD;  Location: [facility] ENDOSCOPY;                Service: Pulmonary;; 04/07/2024: BRONCHIAL NEEDLE ASPIRATION BIOPSY     Comment:  Procedure: BRONCHOSCOPY, WITH NEEDLE ASPIRATION BIOPSY;               Surgeon: Shelah Lamar RAMAN, MD;  Location: [facility] ENDOSCOPY;                Service: Pulmonary;; 11/27/2007: CATARACT EXTRACTION     Comment:  bilateral 11/26/2010:  COLONOSCOPY     Comment:  Dr. Vicci. Diverticulosis. BE also done--normal 10/07/2023: ESOPHAGOGASTRODUODENOSCOPY (EGD) WITH PROPOFOL ; N/A     Comment:  Procedure: ESOPHAGOGASTRODUODENOSCOPY (EGD) WITH               PROPOFOL ;  Surgeon: Wilhelmenia Aloha Raddle., MD;                Location: [facility];  Service: Gastroenterology;                Laterality: N/A; 10/07/2023: EUS; N/A     Comment:  Procedure: UPPER ENDOSCOPIC ULTRASOUND (EUS) RADIAL;                Surgeon: Wilhelmenia Aloha Raddle., MD;  Location: [facility]               ENDOSCOPY;  Service: Gastroenterology;  Laterality: N/A; 07/09/2023: FIDUCIAL MARKER PLACEMENT     Comment:  Procedure: FIDUCIAL MARKER PLACEMENT;  Surgeon: Shelah Lamar RAMAN, MD;  Location: [facility];  Service:               Pulmonary;; 10/07/2023: FINE NEEDLE ASPIRATION; N/A     Comment:  Procedure: FINE NEEDLE ASPIRATION (FNA) LINEAR;                Surgeon: Wilhelmenia Aloha Raddle., MD;  Location: [facility]               ENDOSCOPY;  Service: Gastroenterology;  Laterality: N/A; 11/26/2005: OMENTECTOMY     Comment:  and pelvic lymphadenectomy (with BSO) 11/22/2023: PORTACATH PLACEMENT; N/A     Comment:  Procedure: INSERTION PORT-A-CATH;  Surgeon: Aron Shoulders, MD;  Location: [facility];  Service: General;                Laterality: N/A; 07/18/2023: ROBOTIC ASSISTED LAPAROSCOPIC LYSIS OF ADHESION; N/A     Comment:  Procedure: ROBOTIC ASSISTED PELVIC MASS EXCISION WITH               RADICAL DISSECTION, MINI LAPAROTOMY, CYSTOSCOPY;  Surgeon: Viktoria Comer SAUNDERS, MD;  Location: [facility]S;                Service: Gynecology;  Laterality: N/A; 07/2021: SKIN CANCER EXCISION; Left     Comment:  neck; pt can't recall type of cancer age 7: TONSILLECTOMY late 20's: VAGINAL HYSTERECTOMY     Comment:  didn't want any more children 04/07/2024: VIDEO BRONCHOSCOPY WITH ENDOBRONCHIAL NAVIGATION; Bilateral     Comment:  Procedure: VIDEO  BRONCHOSCOPY WITH ENDOBRONCHIAL               NAVIGATION;  Surgeon: Shelah Lamar RAMAN, MD;  Location: [facility]               ENDOSCOPY;  Service: Pulmonary;  Laterality: Bilateral;                WITH FLUORO     Reproductive/Obstetrics negative OB ROS                              Anesthesia Physical Anesthesia Plan  ASA: 3  Anesthesia Plan: General   Post-op Pain Management: Minimal or no pain anticipated   Induction: Intravenous  PONV Risk Score and Plan: 2 and Propofol  infusion and TIVA  Airway Management Planned: Natural Airway and Nasal Cannula  Additional Equipment:   Intra-op Plan:   Post-operative Plan:   Informed Consent: I have reviewed the patients History and Physical, chart, labs and discussed the procedure including the risks, benefits and alternatives for the proposed anesthesia with the patient or authorized representative who has indicated his/her understanding and acceptance.     Dental Advisory Given  Plan Discussed with: Anesthesiologist, CRNA and Surgeon  Anesthesia Plan Comments: (Patient consented for risks of anesthesia including but not limited to:  - adverse reactions to medications - risk of airway placement if required - damage to eyes, teeth, lips or other oral mucosa - nerve damage due to positioning  - sore throat or hoarseness - Damage to heart, brain, nerves, lungs, other parts of body or loss of life  Patient voiced understanding and assent.)         Anesthesia Quick Evaluation

## 2024-07-08 NOTE — OR Nursing (Signed)
 0.5 Glucagon  ordered and given to pt per Dr. Jinny verbal order

## 2024-07-08 NOTE — Op Note (Signed)
 [facility] Gastroenterology Patient Name: Tiffany [name] Procedure Date: 07/08/2024 1:01 PM MRN: [id number] Account #: [id number] Date of Birth: 05-17-1946 Admit Type: Outpatient Age: 78 Room: [facility] ENDO ROOM 4 Gender: Female Note Status: Finalized Instrument Name: CELINDA [id number] Procedure:             ERCP Indications:           Malignant tumor of the head of pancreas Providers:             Rogelia Copping MD, MD Referring MD:          Annabelle Fetters MD Medicines:             Propofol  per Anesthesia Complications:         No immediate complications. Procedure:             Pre-Anesthesia Assessment:                        - Prior to the procedure, a History and Physical was                         performed, and patient medications and allergies were                         reviewed. The patient's tolerance of previous                         anesthesia was also reviewed. The risks and benefits                         of the procedure and the sedation options and risks                         were discussed with the patient. All questions were                         answered, and informed consent was obtained. Prior                         Anticoagulants: The patient has taken no anticoagulant                         or antiplatelet agents. ASA Grade Assessment: III - A                         patient with severe systemic disease. After reviewing                         the risks and benefits, the patient was deemed in                         satisfactory condition to undergo the procedure.                        After obtaining informed consent, the scope was passed                         under direct vision. Throughout the procedure, the  patient's blood pressure, pulse, and oxygen                          saturations were monitored continuously. The                         Duodenoscope was introduced through the mouth, and                          used to inject contrast into and used to cannulate the                         dorsal pancreatic duct. The patient tolerated the                         procedure well. The ERCP was technically difficult and                         complex due to challenging cannulation. Findings:      The scout film was normal. The major papilla was bulging and floppy. The       ventral pancreatic duct was deeply cannulated with the short-nosed       traction sphincterotome. Contrast was injected. I personally interpreted       the pancreatic duct images. The bile duct could not be cannulated. A       biliary pre-cut sphincterotomy measuring 7 mm in length was made with a       needle knife using a freehand technique using ERBE electrocautery. There       was no post-sphincterotomy bleeding. The endoscope was withdrawn from       the patient. Impression:            - The major papilla appeared to be bulging.                        - A biliary sphincterotomy was performed.                        - Failed cannulation of the CBD Recommendation:        - Will discuss the option with the patient and family                         for a PTC wire placement with a randevu procedure                         tomorrow vs. Friday.                        - Watch for pancreatitis, bleeding, perforation, and                         cholangitis. Procedure Code(s):     --- Professional ---                        [phone], Endoscopic retrograde cholangiopancreatography                         (ERCP); with sphincterotomy/papillotomy  25670, Endoscopic catheterization of the pancreatic                         ductal system, radiological supervision and                         interpretation Diagnosis Code(s):     --- Professional ---                        C25.0, Malignant neoplasm of head of pancreas CPT copyright 2022 American Medical Association. All rights reserved. The codes documented in this  report are preliminary and upon coder review may  be revised to meet current compliance requirements. Rogelia Copping MD, MD 07/08/2024 2:29:15 PM This report has been signed electronically. Number of Addenda: 0 Note Initiated On: 07/08/2024 1:01 PM Estimated Blood Loss:  Estimated blood loss: none.      [facility]

## 2024-07-08 NOTE — OR Nursing (Signed)
 Report given to darice Molt, pt has no orders, informed her that Dr. Debby was notified by Dr Jinny was to write orders.

## 2024-07-08 NOTE — Telephone Encounter (Signed)
 Tiffany [name] has been contacted and made aware of her upcoming appointments.

## 2024-07-08 NOTE — Op Note (Signed)
 [facility] Gastroenterology Patient Name: Tiffany [name] Procedure Date: 07/08/2024 1:01 PM MRN: [id number] Account #: [id number] Date of Birth: 1946/01/22 Admit Type: Outpatient Age: 78 Room: [facility] ENDO ROOM 4 Gender: Female Note Status: Finalized Instrument Name: CELINDA [id number] Procedure:             ERCP Indications:           Malignant tumor of the head of pancreas Providers:             Rogelia Copping MD, MD Referring MD:          Annabelle Fetters MD Medicines:             Propofol  per Anesthesia Complications:         No immediate complications. Procedure:             Pre-Anesthesia Assessment:                        - Prior to the procedure, a History and Physical was                         performed, and patient medications and allergies were                         reviewed. The patient's tolerance of previous                         anesthesia was also reviewed. The risks and benefits                         of the procedure and the sedation options and risks                         were discussed with the patient. All questions were                         answered, and informed consent was obtained. Prior                         Anticoagulants: The patient has taken no anticoagulant                         or antiplatelet agents. ASA Grade Assessment: III - A                         patient with severe systemic disease. After reviewing                         the risks and benefits, the patient was deemed in                         satisfactory condition to undergo the procedure.                        After obtaining informed consent, the scope was passed                         under direct vision. Throughout the procedure, the  patient's blood pressure, pulse, and oxygen                          saturations were monitored continuously. The                         Duodenoscope was introduced through the mouth, and                          used to inject contrast into and used to cannulate the                         dorsal pancreatic duct. The patient tolerated the                         procedure well. The ERCP was technically difficult and                         complex due to challenging cannulation. Findings:      The scout film was normal. The major papilla was bulging and floppy. The       ventral pancreatic duct was deeply cannulated with the short-nosed       traction sphincterotome. Contrast was injected. I personally interpreted       the pancreatic duct images. The bile duct could not be cannulated. A       biliary pre-cut sphincterotomy measuring 7 mm in length was made with a       needle knife using a freehand technique using ERBE electrocautery. There       was no post-sphincterotomy bleeding. The endoscope was withdrawn from       the patient. Impression:            - The major papilla appeared to be bulging.                        - A biliary sphincterotomy was performed.                        - Failed cannulation of the CBD Recommendation:        - Will discuss the option with the patient and family                         for a PTC wire placement with a randevu procedure                         tomorrow vs. Friday.                        - Watch for pancreatitis, bleeding, perforation, and                         cholangitis. Procedure Code(s):     --- Professional ---                        [phone], Endoscopic retrograde cholangiopancreatography                         (ERCP); with sphincterotomy/papillotomy  25670, Endoscopic catheterization of the pancreatic                         ductal system, radiological supervision and                         interpretation Diagnosis Code(s):     --- Professional ---                        C25.0, Malignant neoplasm of head of pancreas CPT copyright 2022 American Medical Association. All rights reserved. The codes documented in this  report are preliminary and upon coder review may  be revised to meet current compliance requirements. Rogelia Copping MD, MD 07/08/2024 2:29:15 PM This report has been signed electronically. Number of Addenda: 0 Note Initiated On: 07/08/2024 1:01 PM Estimated Blood Loss:  Estimated blood loss: none.      [facility]

## 2024-07-08 NOTE — Transfer of Care (Signed)
 Immediate Anesthesia Transfer of Care Note  Patient: Tiffany [name]  Procedure(s) Performed: ERCP, WITH INTERVENTION IF INDICATED  Patient Location: Endoscopy Unit  Anesthesia Type:General  Level of Consciousness: sedated  Airway & Oxygen  Therapy: Patient Spontanous Breathing  Post-op Assessment: Report given to RN and Post -op Vital signs reviewed and stable  Post vital signs: Reviewed and stable  Last Vitals:  Vitals Value Taken Time  BP 119/59 07/08/24 14:24  Temp 36.1 C 07/08/24 14:24  Pulse 66 07/08/24 14:27  Resp 18 07/08/24 14:27  SpO2 100 % 07/08/24 14:27  Vitals shown include unfiled device data.  Last Pain:  Vitals:   07/08/24 1424  TempSrc: Temporal  PainSc: Asleep         Complications: There were no known notable events for this encounter.

## 2024-07-08 NOTE — H&P (Signed)
 History and Physical    Tiffany [name] DOB: 01-28-1946 DOA: 07/08/2024  PCP: [name], MD  Patient coming from: endoscopy  I have personally briefly reviewed patient's old medical records in [facility]  Chief Complaint: failed cannulation of Common bile duct   HPI: Tiffany [name] is a 78 y.o. female with medical history significant of GERD,HLD, HTN, MGUS,  Lung cancer not on treatment  prioritizing quality of life and symptom management.  Patient in addition also has history of pancreatic cancer with biliary obstruction with associated elevated bilirubin levels and jaundice. Patient presented to Endoscopy suite today for palliative stent placement. However during procedure there was failure of CBG cannulation. Patient is now s/p biliary sphincterotomy after failure CBD cannulation with plans  for PTC wire placement with randevu procedure for palliative stent placement  Patient is admitted for monitoring while awaiting expedited GI procedure. Patient currently notes no concerns, denies, n/v/d/ or abdominal pain currently. She also noted no significant pruritus.  Labs:  Pending  ERCP results The major papilla was bulging and floppy. The ventral pancreatic duct was deeply cannulated with the short- nosed traction sphincterotome. Contrast was injected. I personally interpreted the pancreatic duct images. The bile duct could not be cannulated. A biliary pre- cut sphincterotomy measuring 7 mm in length was made with a needle knife using a freehand technique using ERBE electrocautery. There was no post- sphincterotomy bleeding. The endoscope was withdrawn from the patient.  Impression: - The major papilla appeared to be bulging. - A biliary sphincterotomy was performed. - Failed cannulation of the CBD Recommendation: - Will discuss the option with the patient and family for a PTC wire placement with a randevu procedure tomorrow vs. Friday. - Watch for pancreatitis, bleeding,  perforation, and cholangitis.  Review of Systems: As per HPI otherwise 10 point review of systems negative.   Past Medical History:  Diagnosis Date   Abnormal PFT 09/02/2014   moderate restrictive ventilatory defect.   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 Ivin   Colon polyp 12/2003   Diverticulosis 11/2010   seen on colonoscopy and air contrast BE   Emphysema of lung (HCC)    Family history of breast cancer    GERD (gastroesophageal reflux disease)    Heart valve disease    History of radiation therapy    Right Lung-09/04/23-09/09/23- Dr. Lynwood Nasuti   Hyperlipidemia    Hypertension    previously treated with meds, resolved   MGUS (monoclonal gammopathy of unknown significance) 1999   prevously under care of Dr. 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   Tobacco abuse    quit in 2019    Past Surgical History:  Procedure Laterality Date   BASAL CELL CARCINOMA EXCISION  11/26/2008   L face, R temple   BILATERAL SALPINGOOPHORECTOMY  11/26/2005   cancerous tumor R ovary   BIOPSY  10/07/2023   Procedure: BIOPSY;  Surgeon: Wilhelmenia Aloha Raddle., MD;  Location:  [facility] ENDOSCOPY;  Service: Gastroenterology;;   BRONCHIAL BIOPSY  07/09/2023   Procedure: BRONCHIAL BIOPSIES;  Surgeon: Shelah Lamar RAMAN, MD;  Location: [facility] ENDOSCOPY;  Service: Pulmonary;;   BRONCHIAL BIOPSY  04/07/2024   Procedure: BRONCHOSCOPY, WITH BIOPSY;  Surgeon: Shelah Lamar RAMAN, MD;  Location: [facility] ENDOSCOPY;  Service: Pulmonary;;   BRONCHIAL BRUSHINGS  07/09/2023    Procedure: BRONCHIAL BRUSHINGS;  Surgeon: Shelah Lamar RAMAN, MD;  Location: [facility] ENDOSCOPY;  Service: Pulmonary;;   BRONCHIAL BRUSHINGS  04/07/2024   Procedure: BRONCHOSCOPY, WITH BRUSH BIOPSY;  Surgeon: Shelah Lamar RAMAN, MD;  Location: [facility] ENDOSCOPY;  Service: Pulmonary;;   BRONCHIAL NEEDLE ASPIRATION BIOPSY  07/09/2023   Procedure: BRONCHIAL NEEDLE ASPIRATION BIOPSIES;  Surgeon: Shelah Lamar RAMAN, MD;  Location: [facility] ENDOSCOPY;  Service: Pulmonary;;   BRONCHIAL NEEDLE ASPIRATION BIOPSY  04/07/2024   Procedure: BRONCHOSCOPY, WITH NEEDLE ASPIRATION BIOPSY;  Surgeon: Shelah Lamar RAMAN, MD;  Location: [facility] ENDOSCOPY;  Service: Pulmonary;;   CATARACT EXTRACTION  11/27/2007   bilateral   COLONOSCOPY  11/26/2010   Dr. Vicci. Diverticulosis. BE also done--normal   ESOPHAGOGASTRODUODENOSCOPY (EGD) WITH PROPOFOL  N/A 10/07/2023   Procedure: ESOPHAGOGASTRODUODENOSCOPY (EGD) WITH PROPOFOL ;  Surgeon: Wilhelmenia Aloha Raddle., MD;  Location: [facility] ENDOSCOPY;  Service: Gastroenterology;  Laterality: N/A;   EUS N/A 10/07/2023   Procedure: UPPER ENDOSCOPIC ULTRASOUND (EUS) RADIAL;  Surgeon: Wilhelmenia Aloha Raddle., MD;  Location: [facility] ENDOSCOPY;  Service: Gastroenterology;  Laterality: N/A;   FIDUCIAL MARKER PLACEMENT  07/09/2023   Procedure: FIDUCIAL MARKER PLACEMENT;  Surgeon: Shelah Lamar RAMAN, MD;  Location: [facility] ENDOSCOPY;  Service: Pulmonary;;   FINE NEEDLE ASPIRATION N/A 10/07/2023   Procedure: FINE NEEDLE ASPIRATION (FNA) LINEAR;  Surgeon: Wilhelmenia Aloha Raddle., MD;  Location: [facility] ENDOSCOPY;  Service: Gastroenterology;  Laterality: N/A;   OMENTECTOMY  11/26/2005   and pelvic lymphadenectomy (with BSO)   PORTACATH PLACEMENT N/A 11/22/2023   Procedure: INSERTION PORT-A-CATH;  Surgeon: Aron Shoulders, MD;  Location: [facility] OR;  Service: General;  Laterality: N/A;   ROBOTIC ASSISTED LAPAROSCOPIC LYSIS OF ADHESION N/A 07/18/2023   Procedure: ROBOTIC ASSISTED PELVIC MASS EXCISION WITH RADICAL DISSECTION, MINI LAPAROTOMY, CYSTOSCOPY;   Surgeon: Viktoria Comer SAUNDERS, MD;  Location: [facility] ORS;  Service: Gynecology;  Laterality: N/A;   SKIN CANCER EXCISION Left 07/2021   neck; pt can't recall type of cancer   TONSILLECTOMY  age 42   VAGINAL HYSTERECTOMY  late 20's   didn't want any more children   VIDEO BRONCHOSCOPY WITH ENDOBRONCHIAL NAVIGATION Bilateral 04/07/2024   Procedure: VIDEO BRONCHOSCOPY WITH ENDOBRONCHIAL NAVIGATION;  Surgeon: Shelah Lamar RAMAN, MD;  Location: [facility] ENDOSCOPY;  Service: Pulmonary;  Laterality: Bilateral;  WITH FLUORO     reports that she quit smoking about 5 years ago. Her smoking use included cigarettes. She started smoking about 65 years ago. She has a 119.6 pack-year smoking history. She has been exposed to tobacco smoke. She has never used smokeless tobacco. She reports that she does not currently use alcohol. She reports that she does not use drugs.  No Known Allergies  Family History  Problem Relation Age of Onset   Stroke Mother    Cerebral aneurysm Mother    Hypertension Father    Suicidality Father    Diabetes Maternal Aunt    Diabetes Maternal Aunt    Breast cancer Maternal Aunt    Diabetes Maternal Aunt    Breast cancer Maternal Aunt    Throat cancer Maternal Uncle    Lung cancer Maternal Uncle    Colon cancer Neg Hx  Ovarian cancer Neg Hx    Endometrial cancer Neg Hx    Pancreatic cancer Neg Hx     Prior to Admission medications   Medication Sig Start Date End Date Taking? Authorizing Provider  albuterol  (VENTOLIN  HFA) 108 (90 Base) MCG/ACT inhaler INHALE 2 PUFFS BY MOUTH EVERY 6 HOURS AS NEEDED FOR WHEEZING FOR SHORTNESS OF BREATH . APPOINTMENT REQUIRED FOR FUTURE REFILLS 02/11/24  Yes Kassie Acquanetta Bradley, MD  cholecalciferol (VITAMIN D3) 25 MCG (1000 UNIT) tablet Take 1,000 Units by mouth daily.   Yes [provider]  Coenzyme Q10 (COQ10) 100 MG CAPS Take 100 mg by mouth at bedtime.   Yes [provider]  losartan  (COZAAR ) 50 MG tablet Take 1 tablet (50 mg total)  by mouth daily. 05/25/24  Yes [name], MD  ondansetron  (ZOFRAN ) 8 MG tablet Take 1 tablet (8 mg total) by mouth every 8 (eight) hours as needed for nausea or vomiting. 11/12/23  Yes Lanny Callander, MD  rosuvastatin  (CRESTOR ) 10 MG tablet Take 1 tablet (10 mg total) by mouth at bedtime. 06/10/24 09/08/24 Yes [name], MD  Tiotropium Bromide -Olodaterol (STIOLTO RESPIMAT ) 2.5-2.5 MCG/ACT AERS INHALE 2 PUFFS BY MOUTH ONCE DAILY 09/23/23  Yes Kassie Acquanetta Bradley, MD  acetaminophen  (TYLENOL ) 500 MG tablet Take 2 tablets (1,000 mg total) by mouth every 8 (eight) hours as needed for mild pain (pain score 1-3). 04/07/24 04/07/25  Byrum, Robert S, MD  lidocaine -prilocaine  (EMLA ) cream Apply 1 Application topically as needed (for port access). 04/07/24   Byrum, Robert S, MD  magic mouthwash (nystatin , lidocaine , diphenhydrAMINE , alum & mag hydroxide) suspension Take 5 mLs by mouth 4 (four) times daily as needed for mouth pain. 12/11/23   Burton, Lacie K, NP  mirtazapine  (REMERON ) 7.5 MG tablet Take 1 tablet (7.5 mg total) by mouth at bedtime. Patient not taking: Reported on 06/10/2024 04/01/24   Lanny Callander, MD  prochlorperazine  (COMPAZINE ) 10 MG tablet Take 1 tablet (10 mg total) by mouth every 6 (six) hours as needed for nausea or vomiting. Patient not taking: Reported on 06/10/2024 11/12/23   Lanny Callander, MD    Physical Exam: Vitals:   07/08/24 1440 07/08/24 1447 07/08/24 1456 07/08/24 1500  BP: (!) 124/55 (!) 124/55 123/61 123/61  Pulse:      Resp:  16    Temp:  (!) 96.2 F (35.7 C)    TempSrc:  Temporal    SpO2:  (!) 18%    Weight:        Constitutional: NAD, calm, comfortable Vitals:   07/08/24 1440 07/08/24 1447 07/08/24 1456 07/08/24 1500  BP: (!) 124/55 (!) 124/55 123/61 123/61  Pulse:      Resp:  16    Temp:  (!) 96.2 F (35.7 C)    TempSrc:  Temporal    SpO2:  (!) 18%    Weight:       Eyes: PERRL, lids and conjunctivae normal ENMT: Mucous membranes are moist. Posterior pharynx clear of any  exudate or lesions.Normal dentition.  Neck: normal, supple, no masses, no thyromegaly Respiratory: clear to auscultation bilaterally, no wheezing, no crackles. Normal respiratory effort. No accessory muscle use.  Cardiovascular: Regular rate and rhythm, no murmurs / rubs / gallops. + extremity edema. 2+ pedal pulses Abdomen: no tenderness, no masses palpated. No hepatosplenomegaly. Bowel sounds positive.  Musculoskeletal: no clubbing / cyanosis. No joint deformity upper and lower extremities. Good ROM, no contractures. Normal muscle tone.  Skin: no rashes, lesions, ulcers. No induration Neurologic: CN  grossly intact. Sensation intact, . Strength 5/5 in all 4.  Psychiatric: Normal judgment and insight. Alert and oriented x 3. Normal mood.    Labs on Admission: I have personally reviewed following labs and imaging studies  CBC: No results for input(s): WBC, NEUTROABS, HGB, HCT, MCV, PLT in the last 168 hours. Basic Metabolic Panel: No results for input(s): NA, K, CL, CO2, GLUCOSE, BUN, CREATININE, CALCIUM , MG, PHOS in the last 168 hours. GFR: Estimated Creatinine Clearance: 46 mL/min (by C-G formula based on SCr of 0.68 mg/dL). Liver Function Tests: No results for input(s): AST, ALT, ALKPHOS, BILITOT, PROT, ALBUMIN in the last 168 hours. No results for input(s): LIPASE, AMYLASE in the last 168 hours. No results for input(s): AMMONIA in the last 168 hours. Coagulation Profile: No results for input(s): INR, PROTIME in the last 168 hours. Cardiac Enzymes: No results for input(s): CKTOTAL, CKMB, CKMBINDEX, TROPONINI in the last 168 hours. BNP (last 3 results) No results for input(s): PROBNP in the last 8760 hours. HbA1C: No results for input(s): HGBA1C in the last 72 hours. CBG: No results for input(s): GLUCAP in the last 168 hours. Lipid Profile: No results for input(s): CHOL, HDL, LDLCALC, TRIG, CHOLHDL,  LDLDIRECT in the last 72 hours. Thyroid  Function Tests: No results for input(s): TSH, T4TOTAL, FREET4, T3FREE, THYROIDAB in the last 72 hours. Anemia Panel: No results for input(s): VITAMINB12, FOLATE, FERRITIN, TIBC, IRON, RETICCTPCT in the last 72 hours. Urine analysis:    Component Value Date/Time   LABSPEC 1.010 09/11/2023 1331   BILIRUBINUR negative 09/11/2023 1331   BILIRUBINUR neg 11/01/2016 1530   KETONESUR negative 09/11/2023 1331   PROTEINUR negative 09/11/2023 1331   PROTEINUR neg 11/01/2016 1530   UROBILINOGEN negative 11/01/2016 1530   NITRITE Negative 09/11/2023 1331   NITRITE neg 11/01/2016 1530   LEUKOCYTESUR Large (3+) (A) 09/11/2023 1331    Radiological Exams on Admission: DG C-Arm 1-60 Min-No Report Result Date: 07/08/2024 Fluoroscopy was utilized by the requesting physician.  No radiographic interpretation.   DG C-Arm 1-60 Min-No Report Result Date: 07/08/2024 Fluoroscopy was utilized by the requesting physician.  No radiographic interpretation.    EKG: Independently reviewed.   Assessment/Plan  Pancreatic cancer with bilary obstruction  - s/p ERCP with failed CBD cannulation  -patient admitted for expedited PTC  wire placement followed by palliative stent.  - npo midnight  - supportive care   Lung cancer  - not on treatment  -declines further chemotherapy, prioritizing quality of life and symptom management per onc note   Chronic hyponatremia  -at baseline  -continue to monitor    Hx of N/V due to Malignancy -currently stable  - supportive care   HTN  -resume home regimen once med rec completed  HLD -resume one med rec completed    DVT prophylaxis: scd Code Status: full/ as discussed per patient wishes in event of cardiac arrest  Family Communication: none at bedside Disposition Plan: patient  expected to be admitted greater than 2 midnights  Consults called: GI /Dr Jinny Admission status: med  surg   Camila DELENA Ned MD [facility]   If 7PM-7AM, please contact night-coverage [email or web address] Password [facility]  07/08/2024, 3:17 PM

## 2024-07-08 NOTE — H&P (Signed)
 Rogelia Copping, MD [facility] [address], [address] [place], [place] [address] Phone:[phone] Fax : [phone]  Primary Care Physician:  [name], MD Primary Gastroenterologist:  Dr. Copping  Pre-Procedure History & Physical: HPI:  Tiffany [name] is a 78 y.o. female is here for an ERCP.   Past Medical History:  Diagnosis Date   Abnormal PFT 09/02/2014   moderate restrictive ventilatory defect.   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 Ivin   Colon polyp 12/2003   Diverticulosis 11/2010   seen on colonoscopy and air contrast BE   Emphysema of lung (HCC)    Family history of breast cancer    GERD (gastroesophageal reflux disease)    Heart valve disease    History of radiation therapy    Right Lung-09/04/23-09/09/23- Dr. Lynwood Nasuti   Hyperlipidemia    Hypertension    previously treated with meds, resolved   MGUS (monoclonal gammopathy of unknown significance) 1999   prevously under care of Dr. Deanne   On home oxygen  therapy    2 1/2L/[place]   Osteopenia    DEXA 01/31/2010 at 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   Tobacco abuse    quit in 2019    Past Surgical History:  Procedure Laterality Date   BASAL CELL CARCINOMA EXCISION  11/26/2008   L face, R temple   BILATERAL SALPINGOOPHORECTOMY  11/26/2005   cancerous tumor R ovary   BIOPSY  10/07/2023   Procedure: BIOPSY;  Surgeon: Wilhelmenia Aloha Raddle., MD;  Location: [facility];  Service: Gastroenterology;;   BRONCHIAL BIOPSY  07/09/2023   Procedure: BRONCHIAL BIOPSIES;  Surgeon: Shelah Lamar RAMAN, MD;  Location: [facility]COPY;  Service: Pulmonary;;    BRONCHIAL BIOPSY  04/07/2024   Procedure: BRONCHOSCOPY, WITH BIOPSY;  Surgeon: Shelah Lamar RAMAN, MD;  Location: [facility];  Service: Pulmonary;;   BRONCHIAL BRUSHINGS  07/09/2023   Procedure: BRONCHIAL BRUSHINGS;  Surgeon: Shelah Lamar RAMAN, MD;  Location: [facility] ENDOSCOPY;  Service: Pulmonary;;   BRONCHIAL BRUSHINGS  04/07/2024   Procedure: BRONCHOSCOPY, WITH BRUSH BIOPSY;  Surgeon: Shelah Lamar RAMAN, MD;  Location: [facility];  Service: Pulmonary;;   BRONCHIAL NEEDLE ASPIRATION BIOPSY  07/09/2023   Procedure: BRONCHIAL NEEDLE ASPIRATION BIOPSIES;  Surgeon: Shelah Lamar RAMAN, MD;  Location: [facility];  Service: Pulmonary;;   BRONCHIAL NEEDLE ASPIRATION BIOPSY  04/07/2024   Procedure: BRONCHOSCOPY, WITH NEEDLE ASPIRATION BIOPSY;  Surgeon: Shelah Lamar RAMAN, MD;  Location: [facility] ENDOSCOPY;  Service: Pulmonary;;   CATARACT EXTRACTION  11/27/2007   bilateral   COLONOSCOPY  11/26/2010   Dr. Vicci. Diverticulosis. BE also done--normal   ESOPHAGOGASTRODUODENOSCOPY (EGD) WITH PROPOFOL  N/A 10/07/2023   Procedure: ESOPHAGOGASTRODUODENOSCOPY (EGD) WITH PROPOFOL ;  Surgeon: Wilhelmenia Aloha Raddle., MD;  Location: [facility];  Service: Gastroenterology;  Laterality: N/A;   EUS N/A 10/07/2023   Procedure: UPPER ENDOSCOPIC ULTRASOUND (EUS) RADIAL;  Surgeon: Wilhelmenia Aloha Raddle., MD;  Location: [facility];  Service: Gastroenterology;  Laterality: N/A;   FIDUCIAL MARKER PLACEMENT  07/09/2023   Procedure: FIDUCIAL MARKER PLACEMENT;  Surgeon: Shelah Lamar RAMAN, MD;  Location: [facility] ENDOSCOPY;  Service:  Pulmonary;;   FINE NEEDLE ASPIRATION N/A 10/07/2023   Procedure: FINE NEEDLE ASPIRATION (FNA) LINEAR;  Surgeon: Wilhelmenia Aloha Raddle., MD;  Location: [facility];  Service: Gastroenterology;  Laterality: N/A;   OMENTECTOMY  11/26/2005   and pelvic lymphadenectomy (with BSO)   PORTACATH PLACEMENT N/A 11/22/2023   Procedure: INSERTION PORT-A-CATH;  Surgeon: Aron Shoulders, MD;  Location: [facility];  Service: General;   Laterality: N/A;   ROBOTIC ASSISTED LAPAROSCOPIC LYSIS OF ADHESION N/A 07/18/2023   Procedure: ROBOTIC ASSISTED PELVIC MASS EXCISION WITH RADICAL DISSECTION, MINI LAPAROTOMY, CYSTOSCOPY;  Surgeon: Viktoria Comer SAUNDERS, MD;  Location: [facility]S;  Service: Gynecology;  Laterality: N/A;   SKIN CANCER EXCISION Left 07/2021   neck; pt can't recall type of cancer   TONSILLECTOMY  age 68   VAGINAL HYSTERECTOMY  late 20's   didn't want any more children   VIDEO BRONCHOSCOPY WITH ENDOBRONCHIAL NAVIGATION Bilateral 04/07/2024   Procedure: VIDEO BRONCHOSCOPY WITH ENDOBRONCHIAL NAVIGATION;  Surgeon: Shelah Lamar RAMAN, MD;  Location: [facility];  Service: Pulmonary;  Laterality: Bilateral;  WITH FLUORO    Prior to Admission medications   Medication Sig Start Date End Date Taking? Authorizing Provider  albuterol  (VENTOLIN  HFA) 108 (90 Base) MCG/ACT inhaler INHALE 2 PUFFS BY MOUTH EVERY 6 HOURS AS NEEDED FOR WHEEZING FOR SHORTNESS OF BREATH . APPOINTMENT REQUIRED FOR FUTURE REFILLS 02/11/24  Yes Kassie Acquanetta Bradley, MD  cholecalciferol (VITAMIN D3) 25 MCG (1000 UNIT) tablet Take 1,000 Units by mouth daily.   Yes [provider]  Coenzyme Q10 (COQ10) 100 MG CAPS Take 100 mg by mouth at bedtime.   Yes [provider]  losartan  (COZAAR ) 50 MG tablet Take 1 tablet (50 mg total) by mouth daily. 05/25/24  Yes [name], MD  ondansetron  (ZOFRAN ) 8 MG tablet Take 1 tablet (8 mg total) by mouth every 8 (eight) hours as needed for nausea or vomiting. 11/12/23  Yes Lanny Callander, MD  rosuvastatin  (CRESTOR ) 10 MG tablet Take 1 tablet (10 mg total) by mouth at bedtime. 06/10/24 09/08/24 Yes [name], MD  Tiotropium Bromide -Olodaterol (STIOLTO RESPIMAT ) 2.5-2.5 MCG/ACT AERS INHALE 2 PUFFS BY MOUTH ONCE DAILY 09/23/23  Yes Kassie Acquanetta Bradley, MD  acetaminophen  (TYLENOL ) 500 MG tablet Take 2 tablets (1,000 mg total) by mouth every 8 (eight) hours as needed for mild pain (pain score 1-3). 04/07/24 04/07/25  Shelah Lamar RAMAN, MD  lidocaine -prilocaine  (EMLA ) cream Apply 1 Application topically as needed (for port access). 04/07/24   Byrum, Robert S, MD  magic mouthwash (nystatin , lidocaine , diphenhydrAMINE, alum & mag hydroxide) suspension Take 5 mLs by mouth 4 (four) times daily as needed for mouth pain. 12/11/23   Burton, Lacie K, NP  mirtazapine  (REMERON ) 7.5 MG tablet Take 1 tablet (7.5 mg total) by mouth at bedtime. Patient not taking: Reported on 06/10/2024 04/01/24   Lanny Callander, MD  prochlorperazine  (COMPAZINE ) 10 MG tablet Take 1 tablet (10 mg total) by mouth every 6 (six) hours as needed for nausea or vomiting. Patient not taking: Reported on 06/10/2024 11/12/23   Lanny Callander, MD    Allergies as of 07/06/2024   (No Known Allergies)    Family History  Problem Relation Age of Onset   Stroke Mother    Cerebral aneurysm Mother    Hypertension Father    Suicidality Father    Diabetes Maternal Aunt    Diabetes Maternal Aunt    Breast cancer Maternal Aunt    Diabetes Maternal Aunt    Breast cancer Maternal Aunt  Throat cancer Maternal Uncle    Lung cancer Maternal Uncle    Colon cancer Neg Hx    Ovarian cancer Neg Hx    Endometrial cancer Neg Hx    Pancreatic cancer Neg Hx     Social History   Socioeconomic History   Marital status: Married    Spouse name: Not on file   Number of children: 1   Years of education: Not on file   Highest education level: Not on file  Occupational History   Occupation: retired Warehouse manager company)    Employer: RETIRED  Tobacco Use   Smoking status: Former    Current packs/day: 0.00    Average packs/day: 2.0 packs/day for 59.8 years (119.6 ttl pk-yrs)    Types: Cigarettes    Start date: 73    Quit date: 09/10/2018    Years since quitting: 5.8    Passive exposure: Past (quit in 2019)   Smokeless tobacco: Never  Vaping Use   Vaping status: Some Days  Substance and Sexual Activity   Alcohol use: Not Currently    Comment: rarely   Drug use: No   Sexual  activity: Not Currently    Partners: Male  Other Topics Concern   Not on file  Social History Narrative   Lives with husband, 1 cat.  Son stays with them some during the week, stays with his girlfriend on the weekends (divorced).   1 grandson, 1 great grandson.      Updated 05/2024   Social Drivers of Health   Financial Resource Strain: Not on file  Food Insecurity: No Food Insecurity (06/30/2024)   Hunger Vital Sign    Worried About Running Out of Food in the Last Year: Never true    Ran Out of Food in the Last Year: Never true  Transportation Needs: No Transportation Needs (06/30/2024)   PRAPARE - Administrator, Civil Service (Medical): No    Lack of Transportation (Non-Medical): No  Physical Activity: Inactive (06/10/2024)   Exercise Vital Sign    Days of Exercise per Week: 0 days    Minutes of Exercise per Session: 0 min  Stress: No Stress Concern Present (06/10/2024)   [employer] of Occupational Health - Occupational Stress Questionnaire    Feeling of Stress: Not at all  Social Connections: Moderately Isolated (02/12/2024)   Social Connection and Isolation Panel    Frequency of Communication with Friends and Family: More than three times a week    Frequency of Social Gatherings with Friends and Family: More than three times a week    Attends Religious Services: 1 to 4 times per year    Active Member of [employer] or Organizations: No    Attends Banker Meetings: Never    Marital Status: Never married  Intimate Partner Violence: Not At Risk (06/30/2024)   Humiliation, Afraid, Rape, and Kick questionnaire    Fear of Current or Ex-Partner: No    Emotionally Abused: No    Physically Abused: No    Sexually Abused: No    Review of Systems: See HPI, otherwise negative ROS  Physical Exam: BP 133/65   Pulse 87   Temp (!) 96.8 F (36 C) (Temporal)   Resp 18   Wt 48.7 kg   SpO2 97%   BMI 17.33 kg/m  General:   Alert,  pleasant and cooperative in  NAD Head:  Normocephalic and atraumatic. Neck:  Supple; no masses or thyromegaly. Lungs:  Clear throughout to auscultation.  Heart:  Regular rate and rhythm. Abdomen:  Soft, nontender and nondistended. Normal bowel sounds, without guarding, and without rebound.   Neurologic:  Alert and  oriented x4;  grossly normal neurologically.  Impression/Plan: Tiffany [name] is here for an ERCP to be performed for obstructive jaundice  Risks, benefits, limitations, and alternatives regarding  ERCP have been reviewed with the patient.  Questions have been answered.  All parties agreeable.   Rogelia Copping, MD  07/08/2024, 12:48 PM

## 2024-07-09 ENCOUNTER — Inpatient Hospital Stay: Admitting: Radiology

## 2024-07-09 ENCOUNTER — Encounter: Payer: Self-pay | Admitting: Anesthesiology

## 2024-07-09 ENCOUNTER — Encounter
Admission: RE | Disposition: A | Discharge status: Home / Self Care | Payer: Self-pay | Source: Home / Self Care | Attending: Student

## 2024-07-09 DIAGNOSIS — K831 Obstruction of bile duct: Secondary | ICD-10-CM

## 2024-07-09 DIAGNOSIS — K838 Other specified diseases of biliary tract: Secondary | ICD-10-CM

## 2024-07-09 DIAGNOSIS — C259 Malignant neoplasm of pancreas, unspecified: Secondary | ICD-10-CM

## 2024-07-09 DIAGNOSIS — Z515 Encounter for palliative care: Secondary | ICD-10-CM

## 2024-07-09 DIAGNOSIS — C7800 Secondary malignant neoplasm of unspecified lung: Secondary | ICD-10-CM

## 2024-07-09 HISTORY — PX: IR BILIARY STENT(S) NEW ACCESS W/O DRAIN: IMG6049

## 2024-07-09 LAB — CBC
HCT: 29.8 % — ABNORMAL LOW (ref 36.0–46.0)
Hemoglobin: 10.4 g/dL — ABNORMAL LOW (ref 12.0–15.0)
MCH: 29.7 pg (ref 26.0–34.0)
MCHC: 34.9 g/dL (ref 30.0–36.0)
MCV: 85.1 fL (ref 80.0–100.0)
Platelets: 218 K/uL (ref 150–400)
RBC: 3.5 MIL/uL — ABNORMAL LOW (ref 3.87–5.11)
RDW: 17.5 % — ABNORMAL HIGH (ref 11.5–15.5)
WBC: 6.1 K/uL (ref 4.0–10.5)
nRBC: 0 % (ref 0.0–0.2)

## 2024-07-09 LAB — GLUCOSE, CAPILLARY
Glucose-Capillary: 99 mg/dL (ref 70–99)
Glucose-Capillary: 93 mg/dL (ref 70–99)

## 2024-07-09 LAB — PROTIME-INR
INR: 1.2 (ref 0.8–1.2)
Prothrombin Time: 15.6 s — ABNORMAL HIGH (ref 11.4–15.2)

## 2024-07-09 LAB — COMPREHENSIVE METABOLIC PANEL WITH GFR
ALT: 362 U/L — ABNORMAL HIGH (ref 0–44)
AST: 389 U/L — ABNORMAL HIGH (ref 15–41)
Albumin: 2.2 g/dL — ABNORMAL LOW (ref 3.5–5.0)
Alkaline Phosphatase: 765 U/L — ABNORMAL HIGH (ref 38–126)
Anion gap: 10 (ref 5–15)
BUN: 17 mg/dL (ref 8–23)
CO2: 25 mmol/L (ref 22–32)
Calcium: 8.4 mg/dL — ABNORMAL LOW (ref 8.9–10.3)
Chloride: 99 mmol/L (ref 98–111)
Creatinine, Ser: 0.33 mg/dL — ABNORMAL LOW (ref 0.44–1.00)
GFR, Estimated: >60 mL/min (ref >60)
Glucose, Bld: 94 mg/dL (ref 70–99)
Potassium: 3.2 mmol/L — ABNORMAL LOW (ref 3.5–5.1)
Sodium: 134 mmol/L — ABNORMAL LOW (ref 135–145)
Total Bilirubin: 17.8 mg/dL — ABNORMAL HIGH (ref 0.0–1.2)
Total Protein: 5.6 g/dL — ABNORMAL LOW (ref 6.5–8.1)

## 2024-07-09 LAB — PHOSPHORUS: Phosphorus: 3.5 mg/dL (ref 2.5–4.6)

## 2024-07-09 LAB — MAGNESIUM: Magnesium: 1.7 mg/dL (ref 1.7–2.4)

## 2024-07-09 LAB — LIPASE, BLOOD: Lipase: 21 U/L (ref 11–51)

## 2024-07-09 SURGERY — ERCP, WITH INTERVENTION IF INDICATED
Anesthesia: General

## 2024-07-09 MED ORDER — CEFAZOLIN SODIUM-DEXTROSE 1-4 GM/50ML-% IV SOLN
INTRAVENOUS | Status: AC | PRN
  Administered 2024-07-09: 2 g via INTRAVENOUS

## 2024-07-09 MED ORDER — FENTANYL CITRATE (PF) 100 MCG/2ML IJ SOLN
INTRAMUSCULAR | Status: AC
Start: 2024-07-09 — End: 2024-07-09
  Filled 2024-07-09: qty 2

## 2024-07-09 MED ORDER — LIDOCAINE HCL 1 % IJ SOLN
20.0000 mL | Freq: Once | INTRAMUSCULAR | Status: AC
  Administered 2024-07-09: 20 mL via INTRADERMAL
  Filled 2024-07-09: qty 20

## 2024-07-09 MED ORDER — LIDOCAINE HCL 1 % IJ SOLN
10.0000 mL | Freq: Once | INTRAMUSCULAR | Status: AC
  Administered 2024-07-09: 10 mL via INTRADERMAL

## 2024-07-09 MED ORDER — MIDAZOLAM HCL 2 MG/2ML IJ SOLN
INTRAMUSCULAR | Status: AC | PRN
Start: 2024-07-09 — End: 2024-07-09
  Administered 2024-07-09 (×2): .5 mg via INTRAVENOUS
  Administered 2024-07-09: 1 mg via INTRAVENOUS

## 2024-07-09 MED ORDER — IOHEXOL 300 MG/ML  SOLN
80.0000 mL | Freq: Once | INTRAMUSCULAR | Status: AC | PRN
  Administered 2024-07-09: 80 mL

## 2024-07-09 MED ORDER — DIPHENHYDRAMINE HCL 50 MG/ML IJ SOLN
INTRAMUSCULAR | Status: AC | PRN
  Administered 2024-07-09: 25 mg via INTRAVENOUS

## 2024-07-09 MED ORDER — LIDOCAINE HCL 1 % IJ SOLN
INTRAMUSCULAR | Status: AC
  Filled 2024-07-09: qty 20

## 2024-07-09 MED ORDER — FENTANYL CITRATE (PF) 100 MCG/2ML IJ SOLN
INTRAMUSCULAR | Status: AC | PRN
  Administered 2024-07-09: 50 ug via INTRAVENOUS
  Administered 2024-07-09 (×2): 25 ug via INTRAVENOUS

## 2024-07-09 MED ORDER — MIDAZOLAM HCL 2 MG/2ML IJ SOLN
INTRAMUSCULAR | Status: AC
  Filled 2024-07-09: qty 2

## 2024-07-09 MED ORDER — CEFAZOLIN SODIUM-DEXTROSE 2-4 GM/100ML-% IV SOLN
INTRAVENOUS | Status: AC
  Filled 2024-07-09: qty 100

## 2024-07-09 MED ORDER — DIPHENHYDRAMINE HCL 50 MG/ML IJ SOLN
INTRAMUSCULAR | Status: AC
  Filled 2024-07-09: qty 1

## 2024-07-09 NOTE — Plan of Care (Signed)

## 2024-07-09 NOTE — Consult Note (Signed)
 Chief Complaint:  Obstructive Jaundice  Procedure: Cholangiogram with possible stent placement  Referring Provider(s): Dr. JONETTA Copping  Supervising Physician: Hughes Simmonds  Patient Status: [facility] - In-pt  History of Present Illness: Tiffany [name] is a 78 y.o. female with a history of pancreatic cancer with biliary obstruction leading to elevated transaminases. Patient was subsequently referred to GI for ERCP and stent placement. Patient presented to [facility] on 8/13 for her procedure with Dr. Copping; however, per op note, he was unable to cannulate the bile duct. Patient was admitted overnight and IR was consulted for cholangiogram and possible stent placement. Case and images reviewed and approved by Dr. JINNY Hughes.  Patient resting in bed in no acute distress. Admits to some abdominal pain which is unchanged from her baseline, but is otherwise without complaints. Patient reports not wanting any external drainage bag. Discussed with patient that our goal is to gain access, but we will not be placing a biliary drainage bag. She verbalized understanding and is agreeable to procedure. All questions and concerns answered at the bedside.   Patient is Full Code  Past Medical History:  Diagnosis Date   Abnormal PFT 09/02/2014   moderate restrictive ventilatory defect.   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 Ivin   Colon polyp 12/2003   Diverticulosis 11/2010   seen on colonoscopy and air contrast BE   Emphysema of lung (HCC)    Family history of breast cancer    GERD (gastroesophageal reflux disease)    Heart valve disease    History of radiation therapy    Right Lung-09/04/23-09/09/23- Dr. Lynwood Nasuti   Hyperlipidemia    Hypertension    previously treated with meds, resolved   MGUS (monoclonal gammopathy of unknown significance) 1999   prevously under care of Dr. 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   Tobacco abuse    quit in 2019    Past Surgical History:  Procedure Laterality Date   BASAL CELL CARCINOMA EXCISION  11/26/2008   L face, R temple   BILATERAL SALPINGOOPHORECTOMY  11/26/2005   cancerous tumor R ovary   BIOPSY  10/07/2023   Procedure: BIOPSY;  Surgeon: Wilhelmenia Aloha Raddle., MD;  Location: [facility];  Service: Gastroenterology;;   BRONCHIAL BIOPSY  07/09/2023   Procedure: BRONCHIAL BIOPSIES;  Surgeon: Shelah Lamar RAMAN, MD;  Location: [facility] ENDOSCOPY;  Service: Pulmonary;;   BRONCHIAL BIOPSY  04/07/2024   Procedure: BRONCHOSCOPY, WITH BIOPSY;  Surgeon: Shelah Lamar RAMAN, MD;  Location: [facility]COPY;  Service: Pulmonary;;   BRONCHIAL BRUSHINGS  07/09/2023   Procedure: BRONCHIAL BRUSHINGS;  Surgeon: Shelah Lamar RAMAN, MD;  Location: [facility] ENDOSCOPY;  Service: Pulmonary;;   BRONCHIAL BRUSHINGS  04/07/2024   Procedure: BRONCHOSCOPY, WITH BRUSH BIOPSY;  Surgeon: Shelah Lamar RAMAN, MD;  Location: [facility] ENDOSCOPY;  Service: Pulmonary;;   BRONCHIAL NEEDLE ASPIRATION BIOPSY  07/09/2023   Procedure: BRONCHIAL NEEDLE ASPIRATION BIOPSIES;  Surgeon: Shelah Lamar RAMAN, MD;  Location: [facility]  ENDOSCOPY;  Service: Pulmonary;;   BRONCHIAL NEEDLE ASPIRATION BIOPSY  04/07/2024   Procedure: BRONCHOSCOPY, WITH NEEDLE ASPIRATION BIOPSY;  Surgeon: Shelah Lamar RAMAN, MD;  Location: [facility] ENDOSCOPY;  Service: Pulmonary;;   CATARACT EXTRACTION  11/27/2007   bilateral   COLONOSCOPY  11/26/2010   Dr. Vicci. Diverticulosis. BE also done--normal   ERCP N/A 07/08/2024   Procedure: ERCP, WITH INTERVENTION IF INDICATED;  Surgeon: Jinny Carmine, MD;  Location: [facility]  ENDOSCOPY;  Service: Endoscopy;  Laterality: N/A;  STENT PLACEMENT   ESOPHAGOGASTRODUODENOSCOPY (EGD) WITH PROPOFOL  N/A 10/07/2023   Procedure: ESOPHAGOGASTRODUODENOSCOPY (EGD) WITH PROPOFOL ;  Surgeon: Wilhelmenia Aloha Raddle., MD;  Location: [facility] ENDOSCOPY;  Service: Gastroenterology;  Laterality: N/A;   EUS N/A 10/07/2023   Procedure: UPPER ENDOSCOPIC ULTRASOUND (EUS) RADIAL;  Surgeon: Wilhelmenia Aloha Raddle., MD;  Location: [facility] ENDOSCOPY;  Service: Gastroenterology;  Laterality: N/A;   FIDUCIAL MARKER PLACEMENT  07/09/2023   Procedure: FIDUCIAL MARKER PLACEMENT;  Surgeon: Shelah Lamar RAMAN, MD;  Location: [facility] ENDOSCOPY;  Service: Pulmonary;;   FINE NEEDLE ASPIRATION N/A 10/07/2023   Procedure: FINE NEEDLE ASPIRATION (FNA) LINEAR;  Surgeon: Wilhelmenia Aloha Raddle., MD;  Location: [facility];  Service: Gastroenterology;  Laterality: N/A;   OMENTECTOMY  11/26/2005   and pelvic lymphadenectomy (with BSO)   PORTACATH PLACEMENT N/A 11/22/2023   Procedure: INSERTION PORT-A-CATH;  Surgeon: Aron Shoulders, MD;  Location: [facility] OR;  Service: General;  Laterality: N/A;   ROBOTIC ASSISTED LAPAROSCOPIC LYSIS OF ADHESION N/A 07/18/2023   Procedure: ROBOTIC ASSISTED PELVIC MASS EXCISION WITH RADICAL DISSECTION, MINI LAPAROTOMY, CYSTOSCOPY;  Surgeon: Viktoria Comer SAUNDERS, MD;  Location: [facility] ORS;  Service: Gynecology;  Laterality: N/A;   SKIN CANCER EXCISION Left 07/2021   neck; pt can't recall type of cancer   TONSILLECTOMY  age 84   VAGINAL HYSTERECTOMY  late 20's   didn't want any more children   VIDEO BRONCHOSCOPY WITH ENDOBRONCHIAL NAVIGATION Bilateral 04/07/2024   Procedure: VIDEO BRONCHOSCOPY WITH ENDOBRONCHIAL NAVIGATION;  Surgeon: Shelah Lamar RAMAN, MD;  Location: [facility];  Service: Pulmonary;  Laterality: Bilateral;  WITH FLUORO    Allergies: Patient has no known allergies.  Medications: Prior to Admission medications   Medication Sig Start Date End Date Taking? Authorizing Provider  albuterol   (VENTOLIN  HFA) 108 (90 Base) MCG/ACT inhaler INHALE 2 PUFFS BY MOUTH EVERY 6 HOURS AS NEEDED FOR WHEEZING FOR SHORTNESS OF BREATH . APPOINTMENT REQUIRED FOR FUTURE REFILLS 02/11/24  Yes Kassie Acquanetta Bradley, MD  cholecalciferol (VITAMIN D3) 25 MCG (1000 UNIT) tablet Take 1,000 Units by mouth daily.   Yes [provider]  Coenzyme Q10 (COQ10) 100 MG CAPS Take 100 mg by mouth at bedtime.   Yes [provider]  losartan  (COZAAR ) 50 MG tablet Take 1 tablet (50 mg total) by mouth daily. 05/25/24  Yes [name], MD  ondansetron  (ZOFRAN ) 8 MG tablet Take 1 tablet (8 mg total) by mouth every 8 (eight) hours as needed for nausea or vomiting. 11/12/23  Yes Lanny Callander, MD  rosuvastatin  (CRESTOR ) 10 MG tablet Take 1 tablet (10 mg total) by mouth at bedtime. 06/10/24 09/08/24 Yes [name], MD  Tiotropium Bromide -Olodaterol (STIOLTO RESPIMAT ) 2.5-2.5 MCG/ACT AERS INHALE 2 PUFFS BY MOUTH ONCE DAILY 09/23/23  Yes Kassie Acquanetta Bradley, MD  acetaminophen  (TYLENOL ) 500 MG tablet Take 2 tablets (1,000 mg total) by mouth every 8 (eight) hours as needed for mild pain (pain score 1-3). 04/07/24 04/07/25  Shelah Lamar RAMAN, MD  lidocaine -prilocaine  (EMLA ) cream Apply 1 Application topically as needed (for port  access). 04/07/24   Byrum, Robert S, MD  magic mouthwash (nystatin , lidocaine , diphenhydrAMINE , alum & mag hydroxide) suspension Take 5 mLs by mouth 4 (four) times daily as needed for mouth pain. 12/11/23   Burton, Lacie K, NP  mirtazapine  (REMERON ) 7.5 MG tablet Take 1 tablet (7.5 mg total) by mouth at bedtime. Patient not taking: Reported on 06/10/2024 04/01/24   Lanny Callander, MD  prochlorperazine  (COMPAZINE ) 10 MG tablet Take 1 tablet (10 mg total) by mouth every 6 (six) hours as needed for nausea or vomiting. Patient not taking: Reported on 06/10/2024 11/12/23   Lanny Callander, MD     Family History  Problem Relation Age of Onset   Stroke Mother    Cerebral aneurysm Mother    Hypertension Father    Suicidality  Father    Diabetes Maternal Aunt    Diabetes Maternal Aunt    Breast cancer Maternal Aunt    Diabetes Maternal Aunt    Breast cancer Maternal Aunt    Throat cancer Maternal Uncle    Lung cancer Maternal Uncle    Colon cancer Neg Hx    Ovarian cancer Neg Hx    Endometrial cancer Neg Hx    Pancreatic cancer Neg Hx     Social History   Socioeconomic History   Marital status: Married    Spouse name: Not on file   Number of children: 1   Years of education: Not on file   Highest education level: Not on file  Occupational History   Occupation: retired Warehouse manager company)    Employer: RETIRED  Tobacco Use   Smoking status: Former    Current packs/day: 0.00    Average packs/day: 2.0 packs/day for 59.8 years (119.6 ttl pk-yrs)    Types: Cigarettes    Start date: 83    Quit date: 09/10/2018    Years since quitting: 5.8    Passive exposure: Past (quit in 2019)   Smokeless tobacco: Never  Vaping Use   Vaping status: Some Days  Substance and Sexual Activity   Alcohol use: Not Currently    Comment: rarely   Drug use: No   Sexual activity: Not Currently    Partners: Male  Other Topics Concern   Not on file  Social History Narrative   Lives with husband, 1 cat.  Son stays with them some during the week, stays with his girlfriend on the weekends (divorced).   1 grandson, 1 great grandson.      Updated 05/2024   Social Drivers of Health   Financial Resource Strain: Not on file  Food Insecurity: No Food Insecurity (07/09/2024)   Hunger Vital Sign    Worried About Running Out of Food in the Last Year: Never true    Ran Out of Food in the Last Year: Never true  Transportation Needs: No Transportation Needs (07/09/2024)   PRAPARE - Administrator, Civil Service (Medical): No    Lack of Transportation (Non-Medical): No  Physical Activity: Inactive (06/10/2024)   Exercise Vital Sign    Days of Exercise per Week: 0 days    Minutes of Exercise per Session: 0 min   Stress: No Stress Concern Present (06/10/2024)   [employer] of Occupational Health - Occupational Stress Questionnaire    Feeling of Stress: Not at all  Social Connections: Moderately Isolated (07/09/2024)   Social Connection and Isolation Panel    Frequency of Communication with Friends and Family: More than three times a week  Frequency of Social Gatherings with Friends and Family: More than three times a week    Attends Religious Services: 1 to 4 times per year    Active Member of [employer] or Organizations: No    Attends Banker Meetings: Never    Marital Status: Never married     Review of Systems  Gastrointestinal:  Positive for abdominal pain.  Patient denies any headache, chest pain, shortness of breath, N/V, or fever/chills. All other systems are negative.   Vital Signs: BP (!) 121/55 (BP Location: Right Arm)   Pulse (!) 59   Temp 97.7 F (36.5 C)   Resp 16   Wt 107 lb 6.4 oz (48.7 kg)   SpO2 92%   BMI 17.33 kg/m    Physical Exam Constitutional:      Appearance: Normal appearance.  HENT:     Mouth/Throat:     Mouth: Mucous membranes are moist.     Pharynx: Oropharynx is clear.  Cardiovascular:     Rate and Rhythm: Normal rate.  Pulmonary:     Effort: Pulmonary effort is normal.  Abdominal:     General: Abdomen is flat.     Palpations: Abdomen is soft.     Tenderness: There is abdominal tenderness (minimal).  Skin:    General: Skin is warm and dry.     Coloration: Skin is jaundiced.  Neurological:     Mental Status: She is alert and oriented to person, place, and time.  Psychiatric:        Behavior: Behavior normal.     Imaging: DG C-Arm 1-60 Min-No Report Result Date: 07/08/2024 Fluoroscopy was utilized by the requesting physician.  No radiographic interpretation.   DG C-Arm 1-60 Min-No Report Result Date: 07/08/2024 Fluoroscopy was utilized by the requesting physician.  No radiographic interpretation.   CT ABDOMEN PELVIS W  CONTRAST Result Date: 07/03/2024 CLINICAL DATA:  Pancreatic head neoplasm. Evaluate response to therapy. * Tracking Code: BO * EXAM: CT ABDOMEN AND PELVIS WITH CONTRAST TECHNIQUE: Multidetector CT imaging of the abdomen and pelvis was performed using the standard protocol following bolus administration of intravenous contrast. RADIATION DOSE REDUCTION: This exam was performed according to the departmental dose-optimization program which includes automated exposure control, adjustment of the mA and/or kV according to patient size and/or use of iterative reconstruction technique. CONTRAST:  80mL OMNIPAQUE  IOHEXOL  300 MG/ML  SOLN COMPARISON:  05/04/2024 PET. Most direct comparison is a CT of 03/05/2024. FINDINGS: Lower chest: Emphysema. Peripheral predominant right lower lobe pulmonary nodules including medially at 2.3 x 1.8 cm on 01/11. Increased from 11 x 10 mm on the prior CT. Normal heart size without pericardial or pleural effusion. Right coronary artery calcification. Hepatobiliary: Hepatic steatosis with mildly heterogeneous arterial high hepatic enhancement but no suspicious lesions on portal venous phase imaging. Development of moderate gallbladder distension with suspicion of small noncalcified stones. Development of moderate intrahepatic and extrahepatic biliary duct dilatation. Common duct measures 18 mm in the upper pancreatic head versus 7 mm at the same level on the prior. Pancreas: Upstream pancreatic duct dilatation is increased, moderate. Example in the body at 11 mm on 41/12 versus 8 mm previously. Followed to the head/uncinate process where the pancreatic primary is somewhat infiltrative but estimated at 3.5 x 3.3 cm on 56/12. Compare 3.0 x 2.3 cm on 03/05/2024. No superimposed acute pancreatitis. Spleen: Normal in size, without focal abnormality. Adrenals/Urinary Tract: Normal adrenal glands. Normal kidneys, without hydronephrosis. Normal urinary bladder. Stomach/Bowel: No gastric outlet  obstruction. Scattered  colonic diverticula. Normal appendix. Normal small bowel. Vascular/Lymphatic: Advanced aortic and branch vessel atherosclerosis. No arterial involvement of tumor. The SMV is contacted by tumor without significant narrowing. Patent splenic and portal veins. aortocaval node measures 8 mm on 42/12 versus 6 mm on the prior (when remeasured). No pelvic sidewall adenopathy. Reproductive: hysterectomy. A left pelvic, likely a ovarian 4.2 x 3.1 cm cystic lesion measured relatively similar on the prior (when remeasured). Minimal dependent complexity. Other: No free intraperitoneal air. No significant free fluid. No evidence of omental or peritoneal disease. Musculoskeletal: Lumbosacral spondylosis. Mild convex left lumbar spine curvature. IMPRESSION: 1. Since the CT of 03/05/2024, enlargement of the pancreatic head primary with development of intra and extrahepatic biliary duct dilatation. Correlate with bilirubin levels and necessity for biliary drainage. 2. Cholelithiasis with new gallbladder distension, likely due to the pancreatic head obstruction. 3. Progressive pancreatic duct dilatation. 4. Enlargement of an 8 mm aortocaval node. Cannot exclude early nodal metastasis. 5. Progressive right base subpleural nodularity, favoring pulmonary metastasis. 6. Similar left pelvic cystic lesion, likely an incidental left ovarian primary versus metastasis. Recommend attention on follow-up. Electronically Signed   By: Rockey Kilts M.D.   On: 07/03/2024 15:58    Labs:  CBC: Recent Labs    04/29/24 1543 06/30/24 1406 07/08/24 1713 07/09/24 0316  WBC 5.4 6.2 7.0 6.1  HGB 11.9* 12.2 11.8* 10.4*  HCT 36.2 36.6 33.9* 29.8*  PLT 180 211 242 218    COAGS: Recent Labs    07/09/24 0316  INR 1.2    BMP: Recent Labs    04/29/24 1543 06/30/24 1406 07/08/24 1713 07/09/24 0316  NA 137 133* 132* 134*  K 3.9 4.4 3.5 3.2*  CL 100 96* 97* 99  CO2 31 31 26 25   GLUCOSE 107* 203* 116* 94  BUN  11 15 18 17   CALCIUM  9.5 9.1 8.7* 8.4*  CREATININE 0.55 0.68 0.53 0.33*  GFRNONAA >60 >60 >60 >60    LIVER FUNCTION TESTS: Recent Labs    04/29/24 1543 06/30/24 1406 07/08/24 1713 07/09/24 0316  BILITOT 0.5 4.4* 20.3* 17.8*  AST 18 434* 501* 389*  ALT 11 418* 447* 362*  ALKPHOS 95 999* 940* 765*  PROT 7.5 7.3 6.7 5.6*  ALBUMIN 3.8 3.6 2.5* 2.2*    TUMOR MARKERS: Recent Labs    09/24/23 1518  CA199 33    Assessment and Plan:  Obstructive Jaundice: Tiffany [name] is a 78 y.o. female with a history of pancreatic head cancer with obstructive jaundice and elevated transaminases. Patient presented for ERCP with Dr. Jinny on 8/13 which was unsuccessful. IR consulted on cholangiogram with possible stent placement. Case and imaging reviewed and approved by Dr. JINNY Hall. Procedure to be performed under moderate sedation.  -NPO since midnight -Heparin  held this afternoon -INR 1.2 this morning; Cr 0.3 -AST 389, ALT 362, Alk Phos 765, T Bili 17.8; all mildly decreased from yesterday  -Plan for cholangiogram with possible stent placement, possible access for ERCP    Risks and benefits of cholangiogram with possible stent placement discussed with the patient including, but not limited to bleeding, infection which may lead to sepsis or even death and damage to adjacent structures.  This interventional procedure involves the use of X-rays and because of the nature of the planned procedure, it is possible that we will have prolonged use of X-ray fluoroscopy.  Potential radiation risks to you include (but are not limited to) the following: - A slightly elevated risk for cancer  several years later in life. This risk is typically less than 0.5% percent. This risk is low in comparison to the normal incidence of human cancer, which is 33% for women and 50% for men according to the American Cancer Society. - Radiation induced injury can include skin redness, resembling a rash, tissue  breakdown / ulcers and hair loss (which can be temporary or permanent).   The likelihood of either of these occurring depends on the difficulty of the procedure and whether you are sensitive to radiation due to previous procedures, disease, or genetic conditions.   IF your procedure requires a prolonged use of radiation, you will be notified and given written instructions for further action.  It is your responsibility to monitor the irradiated area for the 2 weeks following the procedure and to notify your physician if you are concerned that you have suffered a radiation induced injury.    All of the patient's questions were answered, patient is agreeable to proceed.  Consent signed and in IR.   Thank you for allowing our service to participate in Ian Cavey Weare 's care.    Electronically Signed: Theressa Piedra M Anvi Mangal, PA-C   07/09/2024, 9:11 AM     I spent a total of 40 Minutes in face to face in clinical consultation, greater than 50% of which was counseling/coordinating care for cholangiogram with possible stent placement.

## 2024-07-09 NOTE — Plan of Care (Signed)

## 2024-07-09 NOTE — Consult Note (Signed)
 Consultation Note Date: 07/09/2024 at 1045  Patient Name: Tiffany [name]  DOB: 05/24/1946  MRN: [id number]  Age / Sex: 78 y.o., female  PCP: [name], MD Referring Physician: Von Bellis, MD  Subjective: Patient lying in bed, jaundiced, awake, alert, able to acknowledge my presence, and able to make her wishes known.  No family or friends present during my visit.  HPI: Patient is a 78 year old female with a past medical history significant for GERD, HLD, HTN, MGUS, pancreatic cancer with biliary obstruction and associated elevated bili levels and jaundice with lung lung nodules (?  Metastasis).  Patient initially presented to endoscopic suite for palliative stent placement.  However, during procedure patient was unable to have CABG cannulation.  Patient is now status post biliary sphincterotomy after failure of CBD cannulation with plans for PTC wire placement.  Patient was admitted for monitoring while awaiting GI procedure.  PMT was consulted to support patient with goals of care discussions.  Summary of counseling/coordination of care: Extensive chart review completed prior to meeting patient including labs, vital signs, imaging, progress notes, orders, and available advanced directive documents from current and previous encounters.   After reviewing the patient's chart, I then met with patient to discuss diagnosis prognosis, GOC, EOL wishes, disposition and options. I met with patient at bedside in her shared room. She endorsed it was ok to speak with her regarding her medical treatment in the shared room.   I introduced Palliative Medicine as specialized medical care for people living with serious illness. It focuses on providing relief from the symptoms and stress of a serious illness. The goal is to improve quality of life for both the patient and the family.  We discussed a brief life review  of the patient.  She has been married to her husband for 58 years.  They have 1 son, 1 grandson, and now 1 great grandson.  She worked for her [employer] for 30 years in [place] before retiring and helping take care of her grandchild.  She also enjoyed working at [employer].  Symptoms assessed.  Patient shares she is hungry but knows she cannot eat.  She remains n.p.o. for procedure this afternoon.  Discussed importance of nutrition in the context of patient's functional and cognitive status as these are all significant contributing factors to her overall prognosis.  Discussed her pancreatic cancer treatments with Dr. Ileana.  She shares she was doing well until the 4th or 5th treatment when she got so short of breath she felt like she could not even breathe.  She shares she is concerned that if she continues with chemotherapy she would continue to not feel well.  Discussed pros and cons of continuing with medical treatment versus focusing on aging in place send comfort focused care.  We discussed patient's current illness and what it means in the larger context of patient's on-going co-morbidities.  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  She shares understanding and knowing that she has serious cancer but wants to do what she can do to fight it.  Advance directives, concepts specific to code status, artificial feeding and hydration, and rehospitalization were considered and discussed.  Patient endorses she has never created an advance directive or living will but has had several conversations with her husband.  She shares that he has never considered whether or not she would be accepting of ventilatory support, CPR, feeding tube, or other artificial means to sustain her life.   However, she was very clear with me today that she would never want to live long-term on a ventilator.  We discussed temporary use of ventilator that may need to be converted to a tracheostomy.  She shares this does not sound like a lot when I would walk for myself.  Extensive discussion of CODE STATUS as well as patient's hopes for the future.  She is not prepared to make any adjustments to her plan of care at this time.  However, she was appreciative of our discussion.  I highlighted that my visit did not change anything to her medical treatment and that she remains full code and full scope.  She remains in agreement with this.  Given that patient receives treatments at current cancer center, I offered outpatient palliative support with my colleagues.  She was accepting of this.  Outpatient palliative referral placed.  Discussed that PMT will continue to follow and support.  We will step back from daily visits and monitor her chart and her progress.  PMT contact info given and patient advised to reach out with any acute palliative needs during this hospitalization.  Please reengage with PMT if goals change, at patient/family's request, or if patient's health deteriorates during hospitalization.  Primary Decision Maker PATIENT  Physical Exam Vitals reviewed.  HENT:     Head: Normocephalic.     Mouth/Throat:     Mouth: Mucous membranes are moist.  Eyes:     Pupils: Pupils are equal, round, and reactive to light.  Cardiovascular:     Rate and Rhythm: Normal rate.     Pulses: Normal pulses.  Pulmonary:     Effort: Pulmonary effort is normal.  Abdominal:     Palpations: Abdomen is soft.  Skin:    Coloration: Skin is jaundiced.  Neurological:     Mental Status: She is alert and oriented to person, place, and time.  Psychiatric:        Mood and Affect: Mood normal.        Behavior: Behavior normal.     Palliative Assessment/Data: 60-70%     Thank you for this consult.  Palliative medicine will continue to follow and assist holistically.   Time Total: 75 minutes  Time spent includes: Detailed review of medical records (labs, imaging, vital signs), medically appropriate exam (mental status, respiratory, cardiac, skin), discussed with treatment team, counseling and educating patient, family and staff, documenting clinical information, medication management and coordination of care.  Signed by: Lamarr Gunner, DNP, FNP-BC Palliative Medicine   Please contact Palliative Medicine Team providers via [facility] for questions and concerns.

## 2024-07-09 NOTE — Procedures (Signed)
 Vascular and Interventional Radiology Procedure Note  Patient: Olesya Wike Vandall DOB: [birth year] Medical Record Number: [id number] Note Date/Time: 07/09/24 2:15 PM   Performing Physician: Thom Hall, MD Assistant(s): None  Diagnosis: Pancreatic CA. Malignant biliary obstruction. Jaundice. Failed retrograde stenting.  Procedure:  1. ANTEROGRADE CHOLANGIOGRAM 2. METALLIC BILIARY STENT PLACEMENT   Anesthesia: Conscious Sedation Complications: None Estimated Blood Loss: Minimal Specimens:  None  Findings:  External compression at distal CBD from known pancreatic mass. Successful [employer] WallFlex metallic biliary stent placement (10mm x 6cm, partially-covered) No externalized biliary drain or safety access.   See detailed procedure note with images in PACS. The patient tolerated the procedure well without incident or complication and was returned to Recovery in stable condition.    Thom Hall, MD [facility]   Pager. [phone] Clinic. [phone]

## 2024-07-09 NOTE — Anesthesia Postprocedure Evaluation (Addendum)
 Anesthesia Post Note  Patient: Tiffany [name]  Procedure(s) Performed: ERCP, WITH INTERVENTION IF INDICATED  Patient location during evaluation: Endoscopy Anesthesia Type: General Level of consciousness: awake and alert Pain management: pain level controlled Vital Signs Assessment: post-procedure vital signs reviewed and stable Respiratory status: spontaneous breathing, nonlabored ventilation, respiratory function stable and patient connected to nasal cannula oxygen  Cardiovascular status: blood pressure returned to baseline and stable Postop Assessment: no apparent nausea or vomiting Anesthetic complications: no   There were no known notable events for this encounter.   Last Vitals:  Vitals:   07/09/24 1659 07/09/24 1700  BP:  (!) 120/54  Pulse:  72  Resp:  15  Temp:  36.7 C  SpO2: 98% 98%    Last Pain:  Vitals:   07/09/24 1700  TempSrc: Axillary  PainSc:                  [name]

## 2024-07-09 NOTE — Progress Notes (Signed)
 [facility] Progress Note  Patient: Tiffany [name]    FMW:[id number]  DOA: 07/08/2024     Date of Service: the patient was seen and examined on 07/09/2024  No chief complaint on file.  Brief hospital course: Tiffany [name] is a 78 y.o. female with medical history significant of GERD,HLD, HTN, MGUS,  Lung cancer not on treatment  prioritizing quality of life and symptom management.  Patient in addition also has history of pancreatic cancer with biliary obstruction with associated elevated bilirubin levels and jaundice. Patient presented to Endoscopy suite today for palliative stent placement. However during procedure there was failure of CBG cannulation. Patient is now s/p biliary sphincterotomy after failure CBD cannulation with plans  for PTC wire placement with randevu procedure for palliative stent placement  Patient is admitted for monitoring while awaiting expedited GI procedure. Patient currently notes no concerns, denies, n/v/d/ or abdominal pain currently. She also noted no significant pruritus.   Assessment and Plan:   # Malignant biliary obstruction with jaundice # Pancreatic cancer with bilary obstruction  s/p ERCP with failed CBD cannulation  8/14 s/p anterograde cholangiogram and likely completely stent placement done by IR Continue as needed meds for pain control Monitor for pancreatitis Continue low-fat diet    # Lung cancer  - not on treatment  -declines further chemotherapy, prioritizing quality of life and symptom management per onc note    # Chronic hyponatremia  -at baseline  -continue to monitor      # Hx of N/V due to Malignancy -currently stable  - supportive care    # HTN  -resume home regimen once med rec completed   # HLD -resume one med rec completed   Long-term prognosis is poor due to underlying malignancy.  Patient is full code so palliative care was consulted for goals of care discussion.   Body mass index is 17.33 kg/m.   Interventions:  Diet: Low-fat diet DVT Prophylaxis: Subcutaneous Heparin     Advance goals of care discussion: Full code  Family Communication: family was not present at bedside, at the time of interview.  The pt provided permission to discuss medical plan with the family. Opportunity was given to ask question and all questions were answered satisfactorily.   Disposition:  Pt is from home, admitted with malignant CBD stricture for ERCP which was done by GI but failed so IR consulted and [employer]all[employer] stent was inserted today.  Patient is being monitored tonight for any complications, which precludes a safe discharge. Discharge to home, when stable and cleared by GI most likely tomorrow a.m.  Subjective: No significant events overnight, patient was complaining of epigastric pain 7/10, no any other complaints.  Patient was seen in the morning before IR procedure.  Physical Exam: General: NAD, lying comfortably Appear in no distress, affect appropriate Eyes: PERRLA ENT: Oral Mucosa Clear, moist  Neck: no JVD,  Cardiovascular: S1 and S2 Present, no Murmur,  Respiratory: good respiratory effort, Bilateral Air entry equal and Decreased, no Crackles, no wheezes Abdomen: BS +, Soft and mild epigastric tenderness,  Skin: no rashes Extremities: no Pedal edema, no calf tenderness Neurologic: without any new focal findings Gait not checked due to patient safety concerns  Vitals:   07/09/24 1451 07/09/24 1456 07/09/24 1501 07/09/24 1506  BP: 125/63 139/62 128/63 139/69  Pulse: 82 80 82 80  Resp: 16 19 17 17   Temp:      TempSrc:      SpO2: 100% 99% 100% 100%  Weight:  Height:        Intake/Output Summary (Last 24 hours) at 07/09/2024 1510 Last data filed at 07/09/2024 0423 Gross per 24 hour  Intake 523.39 ml  Output --  Net 523.39 ml   Filed Weights   07/08/24 1212 07/09/24 1347  Weight: 48.7 kg 48.7 kg    Data Reviewed: I have personally reviewed and interpreted daily labs,  tele strips, imagings as discussed above. I reviewed all nursing notes, pharmacy notes, vitals, pertinent old records I have discussed plan of care as described above with RN and patient/family.  CBC: Recent Labs  Lab 07/08/24 1713 07/09/24 0316  WBC 7.0 6.1  NEUTROABS 5.7  --   HGB 11.8* 10.4*  HCT 33.9* 29.8*  MCV 87.4 85.1  PLT 242 218   Basic Metabolic Panel: Recent Labs  Lab 07/08/24 1713 07/09/24 0316  NA 132* 134*  K 3.5 3.2*  CL 97* 99  CO2 26 25  GLUCOSE 116* 94  BUN 18 17  CREATININE 0.53 0.33*  CALCIUM  8.7* 8.4*  MG  --  1.7  PHOS  --  3.5    Studies: No results found.  Scheduled Meds:  arformoterol   15 mcg Nebulization BID   And   umeclidinium bromide   1 puff Inhalation Daily   heparin   5,000 Units Subcutaneous Q8H   Continuous Infusions:  sodium chloride  75 mL/hr at 07/09/24 1038    ceFAZolin  (ANCEF ) IV 2 g (07/09/24 1411)   PRN Meds: albuterol , ceFAZolin  (ANCEF ) IV, diphenhydrAMINE , fentaNYL , midazolam , ondansetron  **OR** ondansetron  (ZOFRAN ) IV  Time spent: 35 minutes  Author: ELVAN [name]. MD [facility] 07/09/2024 3:10 PM  To reach On-call, see care teams to locate the attending and reach out to them via [email or web address]. If 7PM-7AM, please contact night-coverage If you still have difficulty reaching the attending provider, please page the [facility] (Director on Call) for [facility] on amion for assistance.

## 2024-07-09 NOTE — Progress Notes (Signed)
 Patient clinically stable post IR biliary stent placement per DR Mugweru, tolerated well. Vitals remained stable pre and post procedure. Received Versed  2 mg along with Fentanyl  100 mcg IV for procedure. Report given  to ,,Jequetta Thomas RN post procedure/specials/18

## 2024-07-09 NOTE — Plan of Care (Signed)
   Problem: Education: Goal: Knowledge of General Education information will improve Description Including pain rating scale, medication(s)/side effects and non-pharmacologic comfort measures Outcome: Progressing

## 2024-07-10 DIAGNOSIS — K831 Obstruction of bile duct: Secondary | ICD-10-CM | POA: Diagnosis not present

## 2024-07-10 LAB — CBC
HCT: 30.8 % — ABNORMAL LOW (ref 36.0–46.0)
Hemoglobin: 10.6 g/dL — ABNORMAL LOW (ref 12.0–15.0)
MCH: 30.5 pg (ref 26.0–34.0)
MCHC: 34.4 g/dL (ref 30.0–36.0)
MCV: 88.5 fL (ref 80.0–100.0)
Platelets: 238 K/uL (ref 150–400)
RBC: 3.48 MIL/uL — ABNORMAL LOW (ref 3.87–5.11)
RDW: 17.7 % — ABNORMAL HIGH (ref 11.5–15.5)
WBC: 10.9 K/uL — ABNORMAL HIGH (ref 4.0–10.5)
nRBC: 0 % (ref 0.0–0.2)

## 2024-07-10 LAB — BASIC METABOLIC PANEL WITH GFR
Anion gap: 9 (ref 5–15)
BUN: 16 mg/dL (ref 8–23)
CO2: 24 mmol/L (ref 22–32)
Calcium: 8 mg/dL — ABNORMAL LOW (ref 8.9–10.3)
Chloride: 101 mmol/L (ref 98–111)
Creatinine, Ser: 0.56 mg/dL (ref 0.44–1.00)
GFR, Estimated: >60 mL/min (ref >60)
Glucose, Bld: 190 mg/dL — ABNORMAL HIGH (ref 70–99)
Potassium: 3.2 mmol/L — ABNORMAL LOW (ref 3.5–5.1)
Sodium: 134 mmol/L — ABNORMAL LOW (ref 135–145)

## 2024-07-10 LAB — HEPATIC FUNCTION PANEL
ALT: 287 U/L — ABNORMAL HIGH (ref 0–44)
AST: 278 U/L — ABNORMAL HIGH (ref 15–41)
Albumin: 2 g/dL — ABNORMAL LOW (ref 3.5–5.0)
Alkaline Phosphatase: 668 U/L — ABNORMAL HIGH (ref 38–126)
Bilirubin, Direct: 7.4 mg/dL — ABNORMAL HIGH (ref 0.0–0.2)
Indirect Bilirubin: 5.7 mg/dL — ABNORMAL HIGH (ref 0.3–0.9)
Total Bilirubin: 13.1 mg/dL — ABNORMAL HIGH (ref 0.0–1.2)
Total Protein: 5.6 g/dL — ABNORMAL LOW (ref 6.5–8.1)

## 2024-07-10 LAB — LIPASE, BLOOD: Lipase: 22 U/L (ref 11–51)

## 2024-07-10 LAB — PROCALCITONIN: Procalcitonin: 6.2 ng/mL

## 2024-07-10 LAB — MAGNESIUM: Magnesium: 1.6 mg/dL — ABNORMAL LOW (ref 1.7–2.4)

## 2024-07-10 LAB — PHOSPHORUS: Phosphorus: 3.3 mg/dL (ref 2.5–4.6)

## 2024-07-10 MED ORDER — POTASSIUM CHLORIDE 20 MEQ PO PACK
40.0000 meq | PACK | ORAL | Status: AC
  Administered 2024-07-10: 40 meq via ORAL
  Filled 2024-07-10: qty 2

## 2024-07-10 MED ORDER — MAGNESIUM SULFATE 2 GM/50ML IV SOLN
2.0000 g | Freq: Once | INTRAVENOUS | Status: AC
  Administered 2024-07-10: 2 g via INTRAVENOUS
  Filled 2024-07-10: qty 50

## 2024-07-10 MED ORDER — PIPERACILLIN-TAZOBACTAM 3.375 G IVPB
3.3750 g | Freq: Three times a day (TID) | INTRAVENOUS | Status: DC
  Administered 2024-07-10 – 2024-07-11 (×3): 3.375 g via INTRAVENOUS
  Filled 2024-07-10 (×3): qty 50

## 2024-07-10 NOTE — Progress Notes (Signed)
 The patient underwent a interventional radiology procedure for stent placement in the common bile duct with good drainage and improvement in the LFTs.  The patient has no complaints today although her white cell count went slightly up today.  Nothing further to do from a GI point of view.  I will sign off.  Please call if any further GI concerns or questions.  We would like to thank you for the opportunity to participate in the care of [facility].

## 2024-07-10 NOTE — Plan of Care (Signed)

## 2024-07-10 NOTE — Progress Notes (Signed)
 [facility] Progress Note  Patient: Tiffany [name]    FMW:[id number]  DOA: 07/08/2024     Date of Service: the patient was seen and examined on 07/10/2024  No chief complaint on file.  Brief hospital course: Tiffany [name] is a 78 y.o. female with medical history significant of GERD,HLD, HTN, MGUS,  Lung cancer not on treatment  prioritizing quality of life and symptom management.  Patient in addition also has history of pancreatic cancer with biliary obstruction with associated elevated bilirubin levels and jaundice. Patient presented to Endoscopy suite today for palliative stent placement. However during procedure there was failure of CBG cannulation. Patient is now s/p biliary sphincterotomy after failure CBD cannulation with plans  for PTC wire placement with randevu procedure for palliative stent placement  Patient is admitted for monitoring while awaiting expedited GI procedure. Patient currently notes no concerns, denies, n/v/d/ or abdominal pain currently. She also noted no significant pruritus.   Assessment and Plan:   # Malignant biliary obstruction with jaundice # Pancreatic cancer with bilary obstruction  s/p ERCP with failed CBD cannulation  8/14 s/p anterograde cholangiogram and likely completely stent placement done by IR Continue as needed meds for pain control Monitor for pancreatitis Continue low-fat diet 8/15 LFTs improved  #Possible cholangitis after ERCP 8/15 WBC 10.9, procalcitonin 6 point WBC count elevated and procalcitonin elevated Clinically no symptoms of infection but due to intervention patient remains at high risk for infection Started Zosyn , pharmacy consulted for dose Check CBC daily  # Hypokalemia, potassium repleted. # Hypomagnesemia, mag repleted. Monitor electrolytes daily and replete as needed.    # Lung cancer  - not on treatment  -declines further chemotherapy, prioritizing quality of life and symptom management per onc note     # Chronic hyponatremia  -at baseline  -continue to monitor      # Hx of N/V due to Malignancy -currently stable  - supportive care    # HTN  -resume home regimen once med rec completed   # HLD -resume one med rec completed   Long-term prognosis is poor due to underlying malignancy.  Patient is full code so palliative care was consulted for goals of care discussion.   Body mass index is 17.33 kg/m.  Interventions:  Diet: Low-fat diet DVT Prophylaxis: Subcutaneous Heparin     Advance goals of care discussion: Full code  Family Communication: family was not present at bedside, at the time of interview.  The pt provided permission to discuss medical plan with the family. Opportunity was given to ask question and all questions were answered satisfactorily.   Disposition:  Pt is from home, admitted with malignant CBD stricture for ERCP which was done by GI but failed so IR consulted and Metallica stent was inserted today.  Patient is being monitored tonight for any complications, which precludes a safe discharge. Discharge to home, when stable and cleared by GI most likely tomorrow a.m.  Subjective: No significant events overnight, patient denies any abdominal pain.  Overall she is feeling better, denied any active issues   Physical Exam: General: NAD, lying comfortably Appear in no distress, affect appropriate Eyes: PERRLA ENT: Oral Mucosa Clear, moist  Neck: no JVD,  Cardiovascular: S1 and S2 Present, no Murmur,  Respiratory: good respiratory effort, Bilateral Air entry equal and Decreased, no Crackles, no wheezes Abdomen: BS +, Soft and mild epigastric tenderness,  Skin: no rashes Extremities: no Pedal edema, no calf tenderness Neurologic: without any new focal findings Gait not checked  due to patient safety concerns  Vitals:   07/09/24 2018 07/10/24 0517 07/10/24 0749 07/10/24 0826  BP:  (!) 104/48  (!) 124/54  Pulse:  74  82  Resp:  18  17  Temp:  98.1 F (36.7  C)  99 F (37.2 C)  TempSrc:      SpO2: 98% 99% 97% 96%  Weight:      Height:        Intake/Output Summary (Last 24 hours) at 07/10/2024 1318 Last data filed at 07/10/2024 0013 Gross per 24 hour  Intake 1312.22 ml  Output --  Net 1312.22 ml   Filed Weights   07/08/24 1212 07/09/24 1347  Weight: 48.7 kg 48.7 kg    Data Reviewed: I have personally reviewed and interpreted daily labs, tele strips, imagings as discussed above. I reviewed all nursing notes, pharmacy notes, vitals, pertinent old records I have discussed plan of care as described above with RN and patient/family.  CBC: Recent Labs  Lab 07/08/24 1713 07/09/24 0316 07/10/24 0359  WBC 7.0 6.1 10.9*  NEUTROABS 5.7  --   --   HGB 11.8* 10.4* 10.6*  HCT 33.9* 29.8* 30.8*  MCV 87.4 85.1 88.5  PLT 242 218 238   Basic Metabolic Panel: Recent Labs  Lab 07/08/24 1713 07/09/24 0316 07/10/24 0359  NA 132* 134* 134*  K 3.5 3.2* 3.2*  CL 97* 99 101  CO2 26 25 24   GLUCOSE 116* 94 190*  BUN 18 17 16   CREATININE 0.53 0.33* 0.56  CALCIUM  8.7* 8.4* 8.0*  MG  --  1.7 1.6*  PHOS  --  3.5 3.3    Studies: No results found.  Scheduled Meds:  arformoterol   15 mcg Nebulization BID   And   umeclidinium bromide   1 puff Inhalation Daily   heparin   5,000 Units Subcutaneous Q8H   potassium chloride   40 mEq Oral Q4H   Continuous Infusions:  piperacillin -tazobactam (ZOSYN )  IV     PRN Meds: albuterol , ondansetron  **OR** ondansetron  (ZOFRAN ) IV  Time spent: 55 minutes  Author: ELVAN [name]. MD [facility] 07/10/2024 1:18 PM  To reach On-call, see care teams to locate the attending and reach out to them via [email or web address]. If 7PM-7AM, please contact night-coverage If you still have difficulty reaching the attending provider, please page the [facility] (Director on Call) for [facility] on amion for assistance.

## 2024-07-10 NOTE — Care Management Important Message (Signed)
 Important Message  Patient Details  Name: Tiffany [name] MRN: [id number] Date of Birth: [birth year]   Important Message Given:  Yes - Medicare IM     Tiffany [name] 07/10/2024, 2:30 PM

## 2024-07-10 NOTE — TOC CM/SW Note (Signed)
 Transition of Care [facility]) - Inpatient Brief Assessment   Patient Details  Name: Sugar Vanzandt Marlette MRN: [id number] Date of Birth: 04-Jul-1946  Transition of Care [facility]) CM/SW Contact:    Asberry CHRISTELLA Jaksch, RN Phone Number: 07/10/2024, 1:32 PM   Clinical Narrative:  Transition of Care [facility]) Screening Note   Patient Details  Name: Massiah Longanecker Zabinski Date of Birth: 1946/07/11   Transition of Care [facility]) CM/SW Contact:    Asberry CHRISTELLA Jaksch, RN Phone Number: 07/10/2024, 1:32 PM    Transition of Care Department [facility]) has reviewed patient and no TOC needs have been identified at this time. If new patient transition needs arise, please place a TOC consult.   Spoke with patient at bedside regarding discharge planning. She states she lives at home with her husband and adult son and that they are both supportive with her care. Her son or friend Elveria takes her to appointments and picks up her prescriptions. She states she has a RW, manual WC, and single point cane at home. She has home O2 established via [employer]. She denies need for any additional HH services or DME. She states she feels safe returning home. Her friend Elveria is to provide transport at DC.    Transition of Care Asessment: Insurance and Status: Insurance coverage has been reviewed Patient has primary care physician: Yes     Prior/Current Home Services: Current home services (Home O2 with [employer]) Social Drivers of Health Review: SDOH reviewed no interventions necessary Readmission risk has been reviewed: Yes Transition of care needs: no transition of care needs at this time

## 2024-07-11 DIAGNOSIS — K831 Obstruction of bile duct: Secondary | ICD-10-CM | POA: Diagnosis not present

## 2024-07-11 LAB — CBC
HCT: 28.7 % — ABNORMAL LOW (ref 36.0–46.0)
Hemoglobin: 9.7 g/dL — ABNORMAL LOW (ref 12.0–15.0)
MCH: 31 pg (ref 26.0–34.0)
MCHC: 33.8 g/dL (ref 30.0–36.0)
MCV: 91.7 fL (ref 80.0–100.0)
Platelets: 250 K/uL (ref 150–400)
RBC: 3.13 MIL/uL — ABNORMAL LOW (ref 3.87–5.11)
RDW: 17.9 % — ABNORMAL HIGH (ref 11.5–15.5)
WBC: 10.2 K/uL (ref 4.0–10.5)
nRBC: 0 % (ref 0.0–0.2)

## 2024-07-11 LAB — BASIC METABOLIC PANEL WITH GFR
Anion gap: 6 (ref 5–15)
BUN: 20 mg/dL (ref 8–23)
CO2: 30 mmol/L (ref 22–32)
Calcium: 8.4 mg/dL — ABNORMAL LOW (ref 8.9–10.3)
Chloride: 100 mmol/L (ref 98–111)
Creatinine, Ser: 0.42 mg/dL — ABNORMAL LOW (ref 0.44–1.00)
GFR, Estimated: >60 mL/min (ref >60)
Glucose, Bld: 122 mg/dL — ABNORMAL HIGH (ref 70–99)
Potassium: 3.4 mmol/L — ABNORMAL LOW (ref 3.5–5.1)
Sodium: 136 mmol/L (ref 135–145)

## 2024-07-11 LAB — HEPATIC FUNCTION PANEL
ALT: 171 U/L — ABNORMAL HIGH (ref 0–44)
AST: 93 U/L — ABNORMAL HIGH (ref 15–41)
Albumin: 2 g/dL — ABNORMAL LOW (ref 3.5–5.0)
Alkaline Phosphatase: 568 U/L — ABNORMAL HIGH (ref 38–126)
Bilirubin, Direct: 4 mg/dL — ABNORMAL HIGH (ref 0.0–0.2)
Indirect Bilirubin: 3.9 mg/dL — ABNORMAL HIGH (ref 0.3–0.9)
Total Bilirubin: 7.9 mg/dL — ABNORMAL HIGH (ref 0.0–1.2)
Total Protein: 5.7 g/dL — ABNORMAL LOW (ref 6.5–8.1)

## 2024-07-11 LAB — MAGNESIUM: Magnesium: 1.9 mg/dL (ref 1.7–2.4)

## 2024-07-11 LAB — LIPASE, BLOOD: Lipase: 21 U/L (ref 11–51)

## 2024-07-11 LAB — PHOSPHORUS: Phosphorus: 2.4 mg/dL — ABNORMAL LOW (ref 2.5–4.6)

## 2024-07-11 MED ORDER — K PHOS MONO-SOD PHOS DI & MONO 155-852-130 MG PO TABS
500.0000 mg | ORAL_TABLET | Freq: Four times a day (QID) | ORAL | Status: DC
  Administered 2024-07-11: 500 mg via ORAL
  Filled 2024-07-11 (×2): qty 2

## 2024-07-11 MED ORDER — AMOXICILLIN-POT CLAVULANATE 875-125 MG PO TABS: 1.0000 | ORAL_TABLET | Freq: Two times a day (BID) | ORAL | 0 refills | Status: AC

## 2024-07-11 NOTE — Discharge Summary (Signed)
 [facility] Discharge Summary   Patient: Tiffany [name] FMW:[id number]  PCP: [name], MD  Date of admission: 07/08/2024   Date of discharge:  07/11/2024     Discharge Diagnoses:  Principal Problem:   Common bile duct obstruction   Admitted From: Home Disposition:  Home   Recommendations for Outpatient Follow-up:  Follow-up with PCP in 1 week, continue monitor BP at home and follow with PCP to titrate medication accordingly.  Hold off antihypertensive medications if blood pressure remains low. Repeat CMP after 1 week.  And repeat CBC after 1 week Follow up LABS/TEST:  As above   Follow-up Information     [name], MD Follow up in 1 week(s).   Specialty: Family Medicine Contact information: [address] [place] [address] [phone]                Diet recommendation: low fat diet  Activity: The patient is advised to gradually reintroduce usual activities, as tolerated  Discharge Condition: stable  Code Status: Full code   History of present illness: As per the H and P dictated on admission.  Hospital Course:  Tiffany [name] is a 78 y.o. female with medical history significant of GERD,HLD, HTN, MGUS,  Lung cancer not on treatment  prioritizing quality of life and symptom management.  Patient in addition also has history of pancreatic cancer with biliary obstruction with associated elevated bilirubin levels and jaundice. Patient presented to Endoscopy suite today for palliative stent placement. However during procedure there was failure of CBG cannulation. Patient is now s/p biliary sphincterotomy after failure CBD cannulation with plans  for PTC wire placement with randevu procedure for palliative stent placement  Patient is admitted for monitoring while awaiting expedited GI procedure. Patient currently notes no concerns, denies, n/v/d/ or abdominal pain currently. She also noted no significant pruritus.     Assessment and Plan:   #  Malignant biliary obstruction with jaundice # Pancreatic cancer with bilary obstruction  s/p ERCP with failed CBD cannulation  8/14 s/p anterograde cholangiogram and likely completely stent placement done by IR Continue as needed meds for pain control Continue low-fat diet 8/15 LFTs improved 8/16 patient remained stable, denied any abdominal pain.  Patient was cleared by GI to discharge and follow-up as an outpatient.   #Possible cholangitis after ERCP 8/15 WBC 10.9, procalcitonin 6. WBC count elevated and procalcitonin elevated Clinically no symptoms of infection but due to intervention patient remains at high risk for infection S/p Zosyn  given during hospital stay.  Transition to oral antibiotics Augmentin  twice daily for 5 days.  Repeat CBC in 1 week and follow with PCP.   # Hypokalemia, potassium repleted.  BMP in 1 week # Hypomagnesemia, mag repleted.  Resolved   # Lung cancer: not on treatment  -declines further chemotherapy, prioritizing quality of life and symptom management per onc note    # Chronic hyponatremia: at baseline. Na 136 today, hyponatremia resolved.  BMP in 1 week, f/u PCP   # Hx of N/V due to Malignancy: currently stable  - supportive care    # HTN: resume home regimen once med rec completed   # HLD: resume one med rec completed   Long-term prognosis is poor due to underlying malignancy.  Patient is full code so palliative care was consulted for goals of care discussion.    Body mass index is 17.33 kg/m.  Nutrition Interventions:  - Patient was instructed, not to drive, operate heavy machinery, perform activities at heights,  swimming or participation in water  activities or provide baby sitting services while on Pain, Sleep and Anxiety Medications; until her outpatient Physician has advised to do so again.  - Also recommended to not to take more than prescribed Pain, Sleep and Anxiety Medications.  Patient was ambulatory without any assistance. On the  day of the discharge the patient's vitals were stable, and no other acute medical condition were reported by patient. the patient was felt safe to be discharge at Home.  Consultants: GI and IR Procedures: CBD stent insertion done by IR s/p ERCP by GI and interventional radiology procedure for stent placement in the common bile duct   Discharge Exam: General: Appear in no distress, no Rash; Oral Mucosa Clear, moist. Cardiovascular: S1 and S2 Present, no Murmur, Respiratory: normal respiratory effort, Bilateral Air entry present and no Crackles, no wheezes Abdomen: Bowel Sound present, Soft and no tenderness, no hernia Extremities: no Pedal edema, no calf tenderness Neurology: alert and oriented to time, place, and person affect appropriate.  Filed Weights   07/08/24 1212 07/09/24 1347  Weight: 48.7 kg 48.7 kg   Vitals:   07/11/24 0716 07/11/24 0736  BP: (!) 109/53   Pulse: 69   Resp: 18   Temp: 98.4 F (36.9 C)   SpO2: 94% 97%    DISCHARGE MEDICATION: Allergies as of 07/11/2024   No Known Allergies      Medication List     STOP taking these medications    mirtazapine  7.5 MG tablet Commonly known as: REMERON    prochlorperazine  10 MG tablet Commonly known as: COMPAZINE        TAKE these medications    acetaminophen  500 MG tablet Commonly known as: TYLENOL  Take 2 tablets (1,000 mg total) by mouth every 8 (eight) hours as needed for mild pain (pain score 1-3).   albuterol  108 (90 Base) MCG/ACT inhaler Commonly known as: VENTOLIN  HFA INHALE 2 PUFFS BY MOUTH EVERY 6 HOURS AS NEEDED FOR WHEEZING FOR SHORTNESS OF BREATH . APPOINTMENT REQUIRED FOR FUTURE REFILLS   amoxicillin -clavulanate 875-125 MG tablet Commonly known as: AUGMENTIN  Take 1 tablet by mouth 2 (two) times daily for 5 days.   cholecalciferol 25 MCG (1000 UNIT) tablet Commonly known as: VITAMIN D3 Take 1,000 Units by mouth daily.   CoQ10 100 MG Caps Take 100 mg by mouth at bedtime.    lidocaine -prilocaine  cream Commonly known as: EMLA  Apply 1 Application topically as needed (for port access).   losartan  50 MG tablet Commonly known as: COZAAR  Take 1 tablet (50 mg total) by mouth daily.   magic mouthwash (nystatin , lidocaine , diphenhydrAMINE , alum & mag hydroxide) suspension Take 5 mLs by mouth 4 (four) times daily as needed for mouth pain.   ondansetron  8 MG tablet Commonly known as: Zofran  Take 1 tablet (8 mg total) by mouth every 8 (eight) hours as needed for nausea or vomiting.   rosuvastatin  10 MG tablet Commonly known as: CRESTOR  Take 1 tablet (10 mg total) by mouth at bedtime.   Stiolto Respimat  2.5-2.5 MCG/ACT Aers Generic drug: Tiotropium Bromide -Olodaterol INHALE 2 PUFFS BY MOUTH ONCE DAILY       No Known Allergies Discharge Instructions     Amb Referral to Palliative Care   Complete by: As directed    Call MD for:  difficulty breathing, headache or visual disturbances   Complete by: As directed    Call MD for:  extreme fatigue   Complete by: As directed    Call MD for:  persistant dizziness or  light-headedness   Complete by: As directed    Call MD for:  persistant nausea and vomiting   Complete by: As directed    Call MD for:  redness, tenderness, or signs of infection (pain, swelling, redness, odor or green/yellow discharge around incision site)   Complete by: As directed    Call MD for:  severe uncontrolled pain   Complete by: As directed    Call MD for:  temperature >100.4   Complete by: As directed    Diet - low sodium heart healthy   Complete by: As directed    Diet general   Complete by: As directed    Discharge instructions   Complete by: As directed    Rogelia Copping, MD, [facility] [address], [address] [place], [place] [address] Phone: [phone] Fax : [phone]   YOU HAD AN ENDOSCOPIC PROCEDURE TODAY: Refer to the procedure report that was given to you for any specific questions about what was found during the  examination.  If the procedure report does not answer your questions, please call your gastroenterologist to clarify.  YOU SHOULD EXPECT: Some feelings of bloating in the abdomen. Passage of more gas than usual.  Walking can help get rid of the air that was put into your GI tract during the procedure and reduce the bloating. If you had a lower endoscopy (such as a colonoscopy or flexible sigmoidoscopy) you may notice spotting of blood in your stool or on the toilet paper.   DIET: Your first meal following the procedure should be a light meal and then it is ok to progress to your normal diet.  A half-sandwich or bowl of soup is an example of a good first meal.  Heavy or fried foods are harder to digest and may make you feel nasueas or bloated.  Drink plenty of fluids but you should avoid alcoholic beverages for 24 hours.  ACTIVITY: Your care partner should take you home directly after the procedure.  You should plan to take it easy, moving slowly for the rest of the day.  You can resume normal activity the day after the procedure however you should NOT DRIVE, make legal decisions or use heavy machinery for 24 hours (because of the sedation medicines used during the test).    SYMPTOMS TO REPORT IMMEDIATELY  A gastroenterologist can be reached at any hour.  Please call your doctor's office for any of the following symptoms:  Following lower endoscopy (colonoscopy, flexible sigmoidoscopy)  Excessive amounts of blood in the stool  Significant tenderness, worsening of abdominal pains  Swelling of the abdomen that is new, acute  Fever of 100 or higher Following upper endoscopy (EGD, EUS, ERCP)  Vomiting of blood or coffee ground material  New, significant abdominal pain  New, significant chest pain or pain under the shoulder blades  Painful or persistently difficult swallowing  New shortness of breath  Black, tarry-looking stools  FOLLOW UP: If any biopsies were taken you will be contacted by  phone or by letter within the next 1-3 weeks.  Call your gastroenterologist if you have not heard about the biopsies in 3 weeks.   Please also call your gastroenterologist's office with any specific questions about appointments or follow up tests.   Discharge instructions   Complete by: As directed    Follow-up with PCP in 1 week, continue monitor BP at home and follow with PCP to titrate medication accordingly.  Hold off antihypertensive medications if blood pressure remains low. Repeat CMP after  1 week.  And repeat CBC after 1 week   Increase activity slowly   Complete by: As directed    Increase activity slowly   Complete by: As directed    No wound care   Complete by: As directed        The results of significant diagnostics from this hospitalization (including imaging, microbiology, ancillary and laboratory) are listed below for reference.    Significant Diagnostic Studies: IR BILIARY STENT(S) NEW ACCESS W/O DRAIN Result Date: 07/10/2024 INDICATION: Malignant biliary obstruction.  Wire placement for ERCP EXAM: Title; PERCUTANEOUS TRANSHEPATIC BILIARY STENT PLACEMENT Procedures: 1. ULTRASOUND and FLUOROSCOPIC GUIDED PERCUTANEOUS TRANSHEPATIC CHOLANGIOGRAM 2. METALLIC BILIARY STENT PLACEMENT Conservation officer, historic buildings WallFlex) COMPARISON:  CT AP, 07/03/2024., 05/04/2024. MEDICATIONS: Ancef  2 gm IV; The antibiotic was administered with an appropriate time frame prior to the initiation of the procedure CONTRAST:  80 mL Omnipaque  300-administered into the biliary tree. ANESTHESIA/SEDATION: Moderate (conscious) sedation was employed during this procedure. A total of Versed  2 mg and Fentanyl  100 mcg was administered intravenously. Moderate Sedation Time: 114 minutes. The patient's level of consciousness and vital signs were monitored continuously by radiology nursing throughout the procedure under my direct supervision. FLUOROSCOPY: Radiation Exposure Index and estimated peak skin dose (PSD); Reference  air kerma (RAK), 151 mGy. COMPLICATIONS: None immediate. TECHNIQUE: Informed written consent was obtained from the patient and/or patient's representative after a discussion of the risks, benefits and alternatives to treatment. Questions regarding the procedure were encouraged and answered. A timeout was performed prior to the initiation of the procedure. The RIGHT upper abdominal quadrant was prepped and draped in the usual sterile fashion, and a sterile drape was applied covering the operative field. Maximum barrier sterile technique with sterile gowns and gloves were used for the procedure. A timeout was performed prior to the initiation of the procedure. Ultrasound scanning of the RIGHT upper abdominal quadrant was performed to delineate the anatomy. Note was made of a distended gallbladder, and initial access was attempted under ultrasound and fluoroscopic guidance for trans-cholecystostomy access however the microwire could not be passed across the cystic duct. The access was aborted and transhepatic biliary access was attempted. After the overlying soft tissues were anesthetized with 1% Lidocaine  with epinephrine , under direct ultrasound guidance, a 22 gauge Chiba needle was utilized to cannulate the peripheral aspect of a LEFT intrahepatic biliary duct. Appropriate position was confirmed with limited contrast injection. Next, the duct was cannulated with a Nitrex wire and dilated with an Accustick set under fluoroscopic guidance. Limited cholangiograms were performed in various obliquities confirming appropriate access. Next, a 4 Fr angled glide catheter was advanced through the outer sheath of the Accustick set and with the use of a stiff Glidewire, advanced through the biliary hilum. Over wire, a 6 Fr balloon-inflatable catheter was advanced to the common bile duct and used to distended distal CBD. Access through the ampulla to the level of the duodenum was obtained and contrast injection confirmed  appropriate positioning. The balloon tip catheter was removed and a 9 Fr, 35 cm sheath was advanced into the duodenum, through which a 10 mm x 6 mm [employer], partially-covered, biliary stent was positioned over the wire across the distal CBD and stenosis, then deployed. The stent was expanded to optimal dimension using a 8 mm x 80 cm balloon. Contrast injection confirmed appropriate positioning with decompression of the extrahepatic biliary tree. The stent deployment device and sheath were removed, then the Amplatz wire was removed after sheathing with a 5  Fr Kumpe catheter. Since the patient had requested not to have an externalized catheter access was therefore intentionally lost. Dressings were applied. The patient tolerated the procedure well without immediate postprocedural complication. FINDINGS: *Sonographic evaluation of the liver demonstrates intrahepatic biliary ductal dilatation as was demonstrated on CT. *Under direct ultrasound guidance, a dilated peripheral duct within the LEFT lobe of the liver was accessed. Limited contrast injection demonstrated marked dilatation of the CBD and biliary tree with communication at the level of the hepatic hilum. *Occlusion of the biliary tree and compression of the distal CBD from known pancreatic mass. *Successful distal CBD metallic biliary stent placement with a 8 mm x 6 cm [employer] WallFlex stent. Final cholangiogram with appropriate stent coverage and improved patency of the common bile duct. IMPRESSION: Percutaneous transhepatic cholangiogram and successful metallic biliary stent placement for distal common bile duct malignant obstruction. Thom Hall, MD [facility] Electronically Signed   By: Thom Hall M.D.   On: 07/10/2024 17:08   DG C-Arm 1-60 Min-No Report Result Date: 07/08/2024 Fluoroscopy was utilized by the requesting physician.  No radiographic interpretation.    DG C-Arm 1-60 Min-No Report Result Date: 07/08/2024 Fluoroscopy was utilized by the requesting physician.  No radiographic interpretation.   CT ABDOMEN PELVIS W CONTRAST Result Date: 07/03/2024 CLINICAL DATA:  Pancreatic head neoplasm. Evaluate response to therapy. * Tracking Code: BO * EXAM: CT ABDOMEN AND PELVIS WITH CONTRAST TECHNIQUE: Multidetector CT imaging of the abdomen and pelvis was performed using the standard protocol following bolus administration of intravenous contrast. RADIATION DOSE REDUCTION: This exam was performed according to the departmental dose-optimization program which includes automated exposure control, adjustment of the mA and/or kV according to patient size and/or use of iterative reconstruction technique. CONTRAST:  80mL OMNIPAQUE  IOHEXOL  300 MG/ML  SOLN COMPARISON:  05/04/2024 PET. Most direct comparison is a CT of 03/05/2024. FINDINGS: Lower chest: Emphysema. Peripheral predominant right lower lobe pulmonary nodules including medially at 2.3 x 1.8 cm on 01/11. Increased from 11 x 10 mm on the prior CT. Normal heart size without pericardial or pleural effusion. Right coronary artery calcification. Hepatobiliary: Hepatic steatosis with mildly heterogeneous arterial high hepatic enhancement but no suspicious lesions on portal venous phase imaging. Development of moderate gallbladder distension with suspicion of small noncalcified stones. Development of moderate intrahepatic and extrahepatic biliary duct dilatation. Common duct measures 18 mm in the upper pancreatic head versus 7 mm at the same level on the prior. Pancreas: Upstream pancreatic duct dilatation is increased, moderate. Example in the body at 11 mm on 41/12 versus 8 mm previously. Followed to the head/uncinate process where the pancreatic primary is somewhat infiltrative but estimated at 3.5 x 3.3 cm on 56/12. Compare 3.0 x 2.3 cm on 03/05/2024. No superimposed acute pancreatitis. Spleen: Normal in size, without focal  abnormality. Adrenals/Urinary Tract: Normal adrenal glands. Normal kidneys, without hydronephrosis. Normal urinary bladder. Stomach/Bowel: No gastric outlet obstruction. Scattered colonic diverticula. Normal appendix. Normal small bowel. Vascular/Lymphatic: Advanced aortic and branch vessel atherosclerosis. No arterial involvement of tumor. The SMV is contacted by tumor without significant narrowing. Patent splenic and portal veins. aortocaval node measures 8 mm on 42/12 versus 6 mm on the prior (when remeasured). No pelvic sidewall adenopathy. Reproductive: hysterectomy. A left pelvic, likely a ovarian 4.2 x 3.1 cm cystic lesion measured relatively similar on the prior (when remeasured). Minimal dependent complexity. Other: No free intraperitoneal air. No significant free fluid. No evidence of omental or peritoneal disease. Musculoskeletal: Lumbosacral  spondylosis. Mild convex left lumbar spine curvature. IMPRESSION: 1. Since the CT of 03/05/2024, enlargement of the pancreatic head primary with development of intra and extrahepatic biliary duct dilatation. Correlate with bilirubin levels and necessity for biliary drainage. 2. Cholelithiasis with new gallbladder distension, likely due to the pancreatic head obstruction. 3. Progressive pancreatic duct dilatation. 4. Enlargement of an 8 mm aortocaval node. Cannot exclude early nodal metastasis. 5. Progressive right base subpleural nodularity, favoring pulmonary metastasis. 6. Similar left pelvic cystic lesion, likely an incidental left ovarian primary versus metastasis. Recommend attention on follow-up. Electronically Signed   By: Rockey Kilts M.D.   On: 07/03/2024 15:58    Microbiology: No results found for this or any previous visit (from the past 240 hours).   Labs: CBC: Recent Labs  Lab 07/08/24 1713 07/09/24 0316 07/10/24 0359 07/11/24 0454  WBC 7.0 6.1 10.9* 10.2  NEUTROABS 5.7  --   --   --   HGB 11.8* 10.4* 10.6* 9.7*  HCT 33.9* 29.8* 30.8*  28.7*  MCV 87.4 85.1 88.5 91.7  PLT 242 218 238 250   Basic Metabolic Panel: Recent Labs  Lab 07/08/24 1713 07/09/24 0316 07/10/24 0359 07/11/24 0454  NA 132* 134* 134* 136  K 3.5 3.2* 3.2* 3.4*  CL 97* 99 101 100  CO2 26 25 24 30   GLUCOSE 116* 94 190* 122*  BUN 18 17 16 20   CREATININE 0.53 0.33* 0.56 0.42*  CALCIUM  8.7* 8.4* 8.0* 8.4*  MG  --  1.7 1.6* 1.9  PHOS  --  3.5 3.3 2.4*   Liver Function Tests: Recent Labs  Lab 07/08/24 1713 07/09/24 0316 07/10/24 0359 07/11/24 0454  AST 501* 389* 278* 93*  ALT 447* 362* 287* 171*  ALKPHOS 940* 765* 668* 568*  BILITOT 20.3* 17.8* 13.1* 7.9*  PROT 6.7 5.6* 5.6* 5.7*  ALBUMIN 2.5* 2.2* 2.0* 2.0*   Recent Labs  Lab 07/09/24 0316 07/10/24 0359 07/11/24 0454  LIPASE 21 22 21    No results for input(s): AMMONIA in the last 168 hours. Cardiac Enzymes: No results for input(s): CKTOTAL, CKMB, CKMBINDEX, TROPONINI in the last 168 hours. BNP (last 3 results) Recent Labs    02/12/24 1434  BNP 80.8   CBG: Recent Labs  Lab 07/09/24 0758 07/09/24 1136  GLUCAP 99 93    Time spent: 35 minutes  Signed:  Elvan Sor  [facility] 07/11/2024 1:18 PM

## 2024-07-13 NOTE — Transitions of Care (Post Inpatient/ED Visit) (Unsigned)
 07/13/2024  [name], female   DOB: 19-Jun-1946, 78 y.o.   MRN: [id number].  TOC RN CM chart review for TOC post discharge inpatient outreach. On chart review it was noted patient was placed on observation status and discharge in observation status.   Outpatient palliative was referred/accepted by patient after inpatient palliative consulted with patient.  PCP HFU scheduled for 07/18/24.   Bing Edison MSN, RN RN Case Sales executive Health  [facility] Office Hours M-F [phone] Direct Dial: [phone] Main Phone [phone]  Fax: [phone] [email or web address]

## 2024-07-16 ENCOUNTER — Telehealth: Payer: Self-pay | Admitting: Nurse Practitioner

## 2024-07-16 NOTE — Telephone Encounter (Signed)
 Called to get the patient scheduled per referral. Talked with the patient and she states she does not wish to be scheduled for palliative at this time. Message sent to the palliative RN to reach out to the patient for support. Referral will not be closed at this time.

## 2024-07-20 DIAGNOSIS — J449 Chronic obstructive pulmonary disease, unspecified: Secondary | ICD-10-CM | POA: Diagnosis not present

## 2024-07-21 ENCOUNTER — Telehealth: Payer: Self-pay

## 2024-07-21 NOTE — Telephone Encounter (Signed)
 This RN was notified by scheduling team that pt had questions surrounding palliative care referral. Attempted to call, no answer, LVM and callback number.

## 2024-07-22 ENCOUNTER — Other Ambulatory Visit (HOSPITAL_BASED_OUTPATIENT_CLINIC_OR_DEPARTMENT_OTHER): Payer: Self-pay | Admitting: Pulmonary Disease

## 2024-07-23 ENCOUNTER — Telehealth: Payer: Self-pay

## 2024-07-23 NOTE — Telephone Encounter (Signed)
 Attempted to call pt again, pt answered. RN outlined palliative care, who we are, what we do, and where we are. All questions answered, pt declined services at this time. Referral will be closed, palliative services available upon re-referral.

## 2024-07-30 ENCOUNTER — Inpatient Hospital Stay: Attending: Gynecologic Oncology

## 2024-07-30 ENCOUNTER — Inpatient Hospital Stay (HOSPITAL_BASED_OUTPATIENT_CLINIC_OR_DEPARTMENT_OTHER): Admitting: Hematology

## 2024-07-30 VITALS — BP 106/66 | HR 67 | Temp 98.4°F | Resp 17 | Ht 66.0 in | Wt 105.8 lb

## 2024-07-30 DIAGNOSIS — M8580 Other specified disorders of bone density and structure, unspecified site: Secondary | ICD-10-CM | POA: Insufficient documentation

## 2024-07-30 DIAGNOSIS — F17290 Nicotine dependence, other tobacco product, uncomplicated: Secondary | ICD-10-CM | POA: Insufficient documentation

## 2024-07-30 DIAGNOSIS — Z8249 Family history of ischemic heart disease and other diseases of the circulatory system: Secondary | ICD-10-CM | POA: Diagnosis not present

## 2024-07-30 DIAGNOSIS — Z90722 Acquired absence of ovaries, bilateral: Secondary | ICD-10-CM | POA: Insufficient documentation

## 2024-07-30 DIAGNOSIS — Z860100 Personal history of colon polyps, unspecified: Secondary | ICD-10-CM | POA: Diagnosis not present

## 2024-07-30 DIAGNOSIS — C3412 Malignant neoplasm of upper lobe, left bronchus or lung: Secondary | ICD-10-CM | POA: Insufficient documentation

## 2024-07-30 DIAGNOSIS — Z833 Family history of diabetes mellitus: Secondary | ICD-10-CM | POA: Diagnosis not present

## 2024-07-30 DIAGNOSIS — I10 Essential (primary) hypertension: Secondary | ICD-10-CM | POA: Insufficient documentation

## 2024-07-30 DIAGNOSIS — K862 Cyst of pancreas: Secondary | ICD-10-CM | POA: Diagnosis not present

## 2024-07-30 DIAGNOSIS — G893 Neoplasm related pain (acute) (chronic): Secondary | ICD-10-CM | POA: Insufficient documentation

## 2024-07-30 DIAGNOSIS — Z823 Family history of stroke: Secondary | ICD-10-CM | POA: Diagnosis not present

## 2024-07-30 DIAGNOSIS — C250 Malignant neoplasm of head of pancreas: Secondary | ICD-10-CM | POA: Diagnosis not present

## 2024-07-30 DIAGNOSIS — N644 Mastodynia: Secondary | ICD-10-CM | POA: Diagnosis not present

## 2024-07-30 DIAGNOSIS — Z90710 Acquired absence of both cervix and uterus: Secondary | ICD-10-CM | POA: Diagnosis not present

## 2024-07-30 DIAGNOSIS — Z818 Family history of other mental and behavioral disorders: Secondary | ICD-10-CM | POA: Insufficient documentation

## 2024-07-30 DIAGNOSIS — Z923 Personal history of irradiation: Secondary | ICD-10-CM | POA: Diagnosis not present

## 2024-07-30 DIAGNOSIS — M549 Dorsalgia, unspecified: Secondary | ICD-10-CM | POA: Insufficient documentation

## 2024-07-30 DIAGNOSIS — Z8701 Personal history of pneumonia (recurrent): Secondary | ICD-10-CM | POA: Insufficient documentation

## 2024-07-30 DIAGNOSIS — Z803 Family history of malignant neoplasm of breast: Secondary | ICD-10-CM | POA: Insufficient documentation

## 2024-07-30 DIAGNOSIS — K831 Obstruction of bile duct: Secondary | ICD-10-CM | POA: Insufficient documentation

## 2024-07-30 DIAGNOSIS — Z801 Family history of malignant neoplasm of trachea, bronchus and lung: Secondary | ICD-10-CM | POA: Insufficient documentation

## 2024-07-30 DIAGNOSIS — K660 Peritoneal adhesions (postprocedural) (postinfection): Secondary | ICD-10-CM | POA: Insufficient documentation

## 2024-07-30 DIAGNOSIS — Z79899 Other long term (current) drug therapy: Secondary | ICD-10-CM | POA: Diagnosis not present

## 2024-07-30 DIAGNOSIS — C3411 Malignant neoplasm of upper lobe, right bronchus or lung: Secondary | ICD-10-CM | POA: Diagnosis not present

## 2024-07-30 DIAGNOSIS — J439 Emphysema, unspecified: Secondary | ICD-10-CM | POA: Diagnosis not present

## 2024-07-30 DIAGNOSIS — Z85828 Personal history of other malignant neoplasm of skin: Secondary | ICD-10-CM | POA: Insufficient documentation

## 2024-07-30 DIAGNOSIS — Z808 Family history of malignant neoplasm of other organs or systems: Secondary | ICD-10-CM | POA: Insufficient documentation

## 2024-07-30 DIAGNOSIS — R109 Unspecified abdominal pain: Secondary | ICD-10-CM | POA: Diagnosis not present

## 2024-07-30 LAB — CMP (CANCER CENTER ONLY)
ALT: 21 U/L (ref 0–44)
AST: 20 U/L (ref 15–41)
Albumin: 3.2 g/dL — ABNORMAL LOW (ref 3.5–5.0)
Alkaline Phosphatase: 194 U/L — ABNORMAL HIGH (ref 38–126)
Anion gap: 4 — ABNORMAL LOW (ref 5–15)
BUN: 14 mg/dL (ref 8–23)
CO2: 31 mmol/L (ref 22–32)
Calcium: 8.5 mg/dL — ABNORMAL LOW (ref 8.9–10.3)
Chloride: 101 mmol/L (ref 98–111)
Creatinine: 0.45 mg/dL (ref 0.44–1.00)
GFR, Estimated: >60 mL/min (ref >60)
Glucose, Bld: 214 mg/dL — ABNORMAL HIGH (ref 70–99)
Potassium: 3.5 mmol/L (ref 3.5–5.1)
Sodium: 136 mmol/L (ref 135–145)
Total Bilirubin: 2.3 mg/dL — ABNORMAL HIGH (ref 0.0–1.2)
Total Protein: 6.7 g/dL (ref 6.5–8.1)

## 2024-07-30 LAB — CBC WITH DIFFERENTIAL (CANCER CENTER ONLY)
Abs Immature Granulocytes: 0.01 K/uL (ref 0.00–0.07)
Basophils Absolute: 0 K/uL (ref 0.0–0.1)
Basophils Relative: 1 %
Eosinophils Absolute: 0.1 K/uL (ref 0.0–0.5)
Eosinophils Relative: 2 %
HCT: 32.8 % — ABNORMAL LOW (ref 36.0–46.0)
Hemoglobin: 10.9 g/dL — ABNORMAL LOW (ref 12.0–15.0)
Immature Granulocytes: 0 %
Lymphocytes Relative: 21 %
Lymphs Abs: 0.9 K/uL (ref 0.7–4.0)
MCH: 31.8 pg (ref 26.0–34.0)
MCHC: 33.2 g/dL (ref 30.0–36.0)
MCV: 95.6 fL (ref 80.0–100.0)
Monocytes Absolute: 0.4 K/uL (ref 0.1–1.0)
Monocytes Relative: 9 %
Neutro Abs: 2.9 K/uL (ref 1.7–7.7)
Neutrophils Relative %: 67 %
Platelet Count: 162 K/uL (ref 150–400)
RBC: 3.43 MIL/uL — ABNORMAL LOW (ref 3.87–5.11)
RDW: 15.9 % — ABNORMAL HIGH (ref 11.5–15.5)
WBC Count: 4.4 K/uL (ref 4.0–10.5)
nRBC: 0 % (ref 0.0–0.2)

## 2024-07-30 MED ORDER — ACETAMINOPHEN-CODEINE 300-30 MG PO TABS
1.0000 | ORAL_TABLET | Freq: Three times a day (TID) | ORAL | 0 refills | Status: DC | PRN
Start: 2024-07-30 — End: 2024-08-27

## 2024-07-30 NOTE — Progress Notes (Signed)
 [facility]   [phone] [phone]   Clinic Follow up Note   Patient Care Team: [name], MD as PCP - General (Family Medicine) Kassie Acquanetta Bradley, MD as Consulting Physician (Pulmonary Disease) Cleotilde Carlin Lenis, OD as Referring Physician (Optometry) Anderson, Dena D, PA-C (Physician Assistant) Lanny Callander, MD as Consulting Physician (Hematology and Oncology) Hanford Powell BRAVO, NP as Nurse Practitioner (Hematology and Oncology)  Date of Service:  07/30/2024  CHIEF COMPLAINT: f/u of pancreatic cancer  CURRENT THERAPY:  Pending radiation vs hospice   Oncology History   Pancreatic cancer (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  -PET scan from June 10, 2023 showed a hypermetabolic 3.1 cm mass in the right upper lobe, and hypermetabolic soft tissue fullness in the pancreatic head, and partially solid and cystic 12.4 cm pelvic mass. 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 -Tiffany underwent surgical resection of the pelvic mass, which reviewed recurrent granulosa cell tumor, s/p resection by GYN Dr. Viktoria  -baseline CA19.9 33 -Pt has seen by pancreatobiliary surgeon Dr. Aron, who recommends neoadjuvant chemotherapy due to the uncertainty of lung metastasis.  Patient declined surgery. -Repeated CT on November 01, 2023 showed improved treated lung mass, but several new or enlarging lung nodules, concerning for metastatic disease.  -Patient is agreeable with chemo, Tiffany started gemcitabine  and Abraxane  on 12/04/2023 -CT 03/05/24 showed slightly decreased pancreatic mass, and a radiation changes to her lung lesion.  However Tiffany has multiple other new pulmonary nodules on the CT, which are mildly hypermetabolic on PET scan in June 2025 -pt was seen by her radiation oncologist Dr. Shannon about her lung cancer and pancreatic cancer consolidation radiation on  05/18/2024 and decided observation only  Pt developed jaundice, ERCP attempted on 07/08/2024 and Tiffany underwent percutaneous biliary drain placement  Assessment & Plan Pancreatic cancer with biliary obstruction Pancreatic cancer with associated biliary obstruction causing jaundice. The cancer is not curable and is likely progressing, as indicated by the jaundice and bile duct obstruction. Tiffany is not currently undergoing chemotherapy or radiation therapy. - Discuss hospice care for comfort and symptom management. - Consider radiation therapy to slow progression if desired. - Communicate with Dr. Shannon regarding the CT scan and follow-up appointment.  Cancer-related pain Intermittent abdominal pain rated at 6 out of 10, likely related to pancreatic cancer. Pain is tolerable with over-the-counter medications but may require stronger medication if it worsens. - Prescribe Tylenol  #3 for pain management. - Advise use of over-the-counter Tylenol  or ibuprofen as first-line treatment. - Monitor pain levels and adjust medication as needed.  Plan - Her jaundice has nearly resolved, Tiffany is clinically doing better. - I do not plan to give her more chemotherapy. - Discussed radiation, versus home hospice.  Tiffany is undetermined. - Tiffany will proceed with CT chest and see Dr. Shannon later this month - Follow-up in 6 weeks. - I called in Tylenol  #3.   SUMMARY OF ONCOLOGIC HISTORY: Oncology History  Adenocarcinoma of upper lobe of right lung (HCC)  08/06/2023 Initial Diagnosis   Adenocarcinoma of upper lobe of right lung (HCC)   08/06/2023 Cancer Staging   Staging form: Lung, AJCC 8th Edition - Clinical: Stage IB (cT2a, cN0, cM0) - Signed by Sherrod Sherrod, MD on 08/06/2023   10/14/2023 Genetic Testing   Negative genetic testing on the CancerNext-Expanded+RNAinsight.  The report date is 10/13/2023.  The CancerNext-Expanded gene panel  offered by [employer] and includes sequencing and rearrangement  analysis for the following 71 genes: AIP, ALK, APC, ATM, BAP1, BARD1, BMPR1A, BRCA1, BRCA2, BRIP1, CDC73, CDH1, CDK4, CDKN1B, CDKN2A, CHEK2, DICER1, FH, FLCN, KIF1B, LZTR1, MAX, MEN1, MET, MLH1, MSH2, MSH6, MUTYH, NF1, NF2, NTHL1, PALB2, PHOX2B, PMS2, POT1, PRKAR1A, PTCH1, PTEN, RAD51C, RAD51D, RB1, RET, SDHA, SDHAF2, SDHB, SDHC, SDHD, SMAD4, SMARCA4, SMARCB1, SMARCE1, STK11, SUFU, TMEM127, TP53, TSC1, TSC2 and VHL (sequencing and deletion/duplication); AXIN2, CTNNA1, EGFR, EGLN1, HOXB13, KIT, MITF, MSH3, PDGFRA, POLD1 and POLE (sequencing only); EPCAM and GREM1 (deletion/duplication only). RNA data is routinely analyzed for use in variant interpretation for all genes.    Pancreatic cancer (HCC)  10/07/2023 Cancer Staging   Staging form: Exocrine Pancreas, AJCC 8th Edition - Clinical stage from 10/07/2023: Stage IB (cT2, cN0, cM0) - Signed by Lanny Callander, MD on 10/25/2023 Total positive nodes: 0   10/25/2023 Initial Diagnosis   Pancreatic cancer (HCC)   12/04/2023 -  Chemotherapy   [name] : PANCREATIC Abraxane  D1,8,15 + Gemcitabine  D1,8,15 q28d     03/05/2024 Imaging   CT CAP with contrast IMPRESSION: 1. Slightly diminished size of a hypodense mass of the pancreatic head, consistent with treatment response. No significant change in diffuse pancreatic ductal dilatation and atrophy of the remaining pancreatic parenchyma. 2. Subsolid lung lesion of the peripheral right upper lobe near a fiducial marker is essentially indistinguishable from background on today's examination with new subpleural consolidation consistent with developing radiation fibrosis. Findings consistent with treatment response of the lesion to local radiation therapy. 3. Multiple additional subsolid and predominantly solid nodular opacities, predominantly in the periphery of the right lung, several of which are increased in size and solid character, appearance and behavior most consistent with worsened multifocal  pulmonary adenocarcinoma although pulmonary metastases not generally excluded in the setting of pancreatic adenocarcinoma. 4. Interval enlargement of a cystic lesion with some peripheral soft tissue in the left lower quadrant, of uncertain nature. This is possibly an unusual cystic metastasis or possibly of ovarian origin if there has been ovarian transposition. 5. Severe emphysema.        Discussed the use of AI scribe software for clinical note transcription with the patient, who gave verbal consent to proceed.  History of Present Illness Tiffany [name] is a 78 year old [name] with pancreatic cancer who presents for follow-up.  Tiffany underwent surgery on August 14th, which was prolonged due to difficulty accessing the area. Post-operatively, Tiffany was hospitalized for two days. Tiffany experiences intermittent stomach pain since the surgery, described as a 'little stomach ache' localized to the area of the cancer, rated as 6 out of 10 on the pain scale. The pain does not worsen with eating. Tiffany has previously used Tylenol  number three for back pain, which was effective.  Tiffany is able to manage daily activities, including cooking and light cleaning, although Tiffany cannot do as much as Tiffany used to. Her son visits occasionally but does not live with her.     All other systems were reviewed with the patient and are negative.  MEDICAL HISTORY:  Past Medical History:  Diagnosis Date   Abnormal PFT 09/02/2014   moderate restrictive ventilatory defect.   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 Ivin   Colon polyp 12/2003   Diverticulosis 11/2010   seen on  colonoscopy and air contrast BE   Emphysema of lung (HCC)    Family history of breast cancer    GERD (gastroesophageal reflux disease)    Heart valve disease    History of radiation therapy    Right Lung-09/04/23-09/09/23-  Dr. Lynwood Nasuti   Hyperlipidemia    Hypertension    previously treated with meds, resolved   MGUS (monoclonal gammopathy of unknown significance) 1999   prevously under care of Dr. 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   Tobacco abuse    quit in 2019    SURGICAL HISTORY: Past Surgical History:  Procedure Laterality Date   BASAL CELL CARCINOMA EXCISION  11/26/2008   L face, R temple   BILATERAL SALPINGOOPHORECTOMY  11/26/2005   cancerous tumor R ovary   BIOPSY  10/07/2023   Procedure: BIOPSY;  Surgeon: Wilhelmenia Aloha Raddle., MD;  Location: [facility];  Service: Gastroenterology;;   BRONCHIAL BIOPSY  07/09/2023   Procedure: BRONCHIAL BIOPSIES;  Surgeon: Shelah Lamar RAMAN, MD;  Location: [facility] ENDOSCOPY;  Service: Pulmonary;;   BRONCHIAL BIOPSY  04/07/2024   Procedure: BRONCHOSCOPY, WITH BIOPSY;  Surgeon: Shelah Lamar RAMAN, MD;  Location: [facility];  Service: Pulmonary;;   BRONCHIAL BRUSHINGS  07/09/2023   Procedure: BRONCHIAL BRUSHINGS;  Surgeon: Shelah Lamar RAMAN, MD;  Location: [facility] ENDOSCOPY;  Service: Pulmonary;;   BRONCHIAL BRUSHINGS  04/07/2024   Procedure: BRONCHOSCOPY, WITH BRUSH BIOPSY;  Surgeon: Shelah Lamar RAMAN, MD;  Location: [facility];  Service: Pulmonary;;   BRONCHIAL NEEDLE ASPIRATION BIOPSY  07/09/2023   Procedure: BRONCHIAL NEEDLE ASPIRATION BIOPSIES;  Surgeon: Shelah Lamar RAMAN, MD;  Location: [facility];  Service: Pulmonary;;   BRONCHIAL NEEDLE ASPIRATION BIOPSY  04/07/2024   Procedure: BRONCHOSCOPY, WITH NEEDLE ASPIRATION BIOPSY;  Surgeon: Shelah Lamar RAMAN, MD;  Location: [facility] ENDOSCOPY;  Service: Pulmonary;;   CATARACT EXTRACTION   11/27/2007   bilateral   COLONOSCOPY  11/26/2010   Dr. Vicci. Diverticulosis. BE also done--normal   ERCP N/A 07/08/2024   Procedure: ERCP, WITH INTERVENTION IF INDICATED;  Surgeon: Jinny Carmine, MD;  Location: [facility] ENDOSCOPY;  Service: Endoscopy;  Laterality: N/A;  STENT PLACEMENT   ESOPHAGOGASTRODUODENOSCOPY (EGD) WITH PROPOFOL  N/A 10/07/2023   Procedure: ESOPHAGOGASTRODUODENOSCOPY (EGD) WITH PROPOFOL ;  Surgeon: Wilhelmenia Aloha Raddle., MD;  Location: [facility];  Service: Gastroenterology;  Laterality: N/A;   EUS N/A 10/07/2023   Procedure: UPPER ENDOSCOPIC ULTRASOUND (EUS) RADIAL;  Surgeon: Wilhelmenia Aloha Raddle., MD;  Location: [facility];  Service: Gastroenterology;  Laterality: N/A;   FIDUCIAL MARKER PLACEMENT  07/09/2023   Procedure: FIDUCIAL MARKER PLACEMENT;  Surgeon: Shelah Lamar RAMAN, MD;  Location: [facility] ENDOSCOPY;  Service: Pulmonary;;   FINE NEEDLE ASPIRATION N/A 10/07/2023   Procedure: FINE NEEDLE ASPIRATION (FNA) LINEAR;  Surgeon: Wilhelmenia Aloha Raddle., MD;  Location: [facility];  Service: Gastroenterology;  Laterality: N/A;   IR BILIARY STENT(S) NEW ACCESS W/O DRAIN  07/09/2024   OMENTECTOMY  11/26/2005   and pelvic lymphadenectomy (with BSO)   PORTACATH PLACEMENT N/A 11/22/2023   Procedure: INSERTION PORT-A-CATH;  Surgeon: Aron Shoulders, MD;  Location: [facility];  Service: General;  Laterality: N/A;  ROBOTIC ASSISTED LAPAROSCOPIC LYSIS OF ADHESION N/A 07/18/2023   Procedure: ROBOTIC ASSISTED PELVIC MASS EXCISION WITH RADICAL DISSECTION, MINI LAPAROTOMY, CYSTOSCOPY;  Surgeon: Viktoria Comer SAUNDERS, MD;  Location: [facility];  Service: Gynecology;  Laterality: N/A;   SKIN CANCER EXCISION Left 07/2021   neck; pt can't recall type of cancer   TONSILLECTOMY  age 52   VAGINAL HYSTERECTOMY  late 20's   didn't want any more children   VIDEO BRONCHOSCOPY WITH ENDOBRONCHIAL NAVIGATION Bilateral 04/07/2024   Procedure: VIDEO BRONCHOSCOPY WITH ENDOBRONCHIAL NAVIGATION;  Surgeon: Shelah Lamar RAMAN, MD;  Location: [facility];  Service: Pulmonary;  Laterality: Bilateral;  WITH FLUORO    I have reviewed the social history and family history with the patient and they are unchanged from previous note.  ALLERGIES:  has no known allergies.  MEDICATIONS:  Current Outpatient Medications  Medication Sig Dispense Refill   acetaminophen  (TYLENOL ) 500 MG tablet Take 2 tablets (1,000 mg total) by mouth every 8 (eight) hours as needed for mild pain (pain score 1-3).     acetaminophen -codeine  (TYLENOL  #3) 300-30 MG tablet Take 1 tablet by mouth every 8 (eight) hours as needed for moderate pain (pain score 4-6). 20 tablet 0   albuterol  (VENTOLIN  HFA) 108 (90 Base) MCG/ACT inhaler INHALE 2 PUFFS BY MOUTH EVERY 6 HOURS AS NEEDED FOR WHEEZING FOR SHORTNESS OF BREATH . APPOINTMENT REQUIRED FOR FUTURE REFILLS 9 g 8   cholecalciferol (VITAMIN D3) 25 MCG (1000 UNIT) tablet Take 1,000 Units by mouth daily.     Coenzyme Q10 (COQ10) 100 MG CAPS Take 100 mg by mouth at bedtime.     lidocaine -prilocaine  (EMLA ) cream Apply 1 Application topically as needed (for port access).     losartan  (COZAAR ) 50 MG tablet Take 1 tablet (50 mg total) by mouth daily. 90 tablet 3   magic mouthwash (nystatin , lidocaine , diphenhydrAMINE , alum & mag hydroxide) suspension Take 5 mLs by mouth 4 (four) times daily as needed for mouth pain. 400 mL 0   ondansetron  (ZOFRAN ) 8 MG tablet Take 1 tablet (8 mg total) by mouth every 8 (eight) hours as needed for nausea or vomiting. 30 tablet 1   rosuvastatin  (CRESTOR ) 10 MG tablet Take 1 tablet (10 mg total) by mouth at bedtime. 90 tablet 3   Tiotropium Bromide -Olodaterol (STIOLTO RESPIMAT ) 2.5-2.5 MCG/ACT AERS INHALE 2 PUFFS BY MOUTH ONCE DAILY 4 g 11   No current facility-administered medications for this visit.    PHYSICAL EXAMINATION: ECOG PERFORMANCE STATUS: 2 - Symptomatic, <50% confined to bed  Vitals:   07/30/24 1146  BP: 106/66  Pulse: 67  Resp: 17  Temp: 98.4 F  (36.9 C)  SpO2: 97%   Wt Readings from Last 3 Encounters:  07/30/24 105 lb 12.8 oz (48 kg)  07/09/24 107 lb 5.8 oz (48.7 kg)  06/30/24 112 lb 1.6 oz (50.8 kg)     GENERAL:alert, no distress and comfortable SKIN: skin color, texture, turgor are normal, no rashes or significant lesions EYES: normal, Conjunctiva are pink and non-injected, sclera clear NECK: supple, thyroid  normal size, non-tender, without nodularity LYMPH:  no palpable lymphadenopathy in the cervical, axillary  LUNGS: clear to auscultation and percussion with normal breathing effort HEART: regular rate & rhythm and no murmurs and no lower extremity edema ABDOMEN:abdomen soft, non-tender and normal bowel sounds Musculoskeletal:no cyanosis of digits and no clubbing  NEURO: alert & oriented x 3 with fluent speech, no focal motor/sensory deficits  Physical Exam   LABORATORY DATA:  I have reviewed  the data as listed    Latest Ref Rng & Units 07/30/2024   11:25 AM 07/11/2024    4:54 AM 07/10/2024    3:59 AM  CBC  WBC 4.0 - 10.5 K/uL 4.4  10.2  10.9   Hemoglobin 12.0 - 15.0 g/dL 89.0  9.7  89.3   Hematocrit 36.0 - 46.0 % 32.8  28.7  30.8   Platelets 150 - 400 K/uL 162  250  238         Latest Ref Rng & Units 07/30/2024   11:25 AM 07/11/2024    4:54 AM 07/10/2024    3:59 AM  CMP  Glucose 70 - 99 mg/dL 785  877  809   BUN 8 - 23 mg/dL 14  20  16    Creatinine 0.44 - 1.00 mg/dL 9.54  9.57  9.43   Sodium 135 - 145 mmol/L 136  136  134   Potassium 3.5 - 5.1 mmol/L 3.5  3.4  3.2   Chloride 98 - 111 mmol/L 101  100  101   CO2 22 - 32 mmol/L 31  30  24    Calcium  8.9 - 10.3 mg/dL 8.5  8.4  8.0   Total Protein 6.5 - 8.1 g/dL 6.7  5.7  5.6   Total Bilirubin 0.0 - 1.2 mg/dL 2.3  7.9  86.8   Alkaline Phos 38 - 126 U/L 194  568  668   AST 15 - 41 U/L 20  93  278   ALT 0 - 44 U/L 21  171  287       RADIOGRAPHIC STUDIES: I have personally reviewed the radiological images as listed and agreed with the findings in the  report. No results found.    No orders of the defined types were placed in this encounter.  All questions were answered. The patient knows to call the clinic with any problems, questions or concerns. No barriers to learning was detected. The total time spent in the appointment was 30 minutes, including review of chart and various tests results, discussions about plan of care and coordination of care plan     Onita Mattock, MD 07/30/2024

## 2024-07-30 NOTE — Assessment & Plan Note (Signed)
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  -PET scan from June 10, 2023 showed a hypermetabolic 3.1 cm mass in the right upper lobe, and hypermetabolic soft tissue fullness in the pancreatic head, and partially solid and cystic 12.4 cm pelvic mass. 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 Viktoria  -baseline CA19.9 33 -Pt has seen by pancreatobiliary surgeon Dr. Aron, who recommends neoadjuvant chemotherapy due to the uncertainty of lung metastasis.  Patient declined surgery. -Repeated CT on November 01, 2023 showed improved treated lung mass, but several new or enlarging lung nodules, concerning for metastatic disease.  -Patient is agreeable with chemo, she started gemcitabine  and Abraxane  on 12/04/2023 -CT 03/05/24 showed slightly decreased pancreatic mass, and a radiation changes to her lung lesion.  However she has multiple other new pulmonary nodules on the CT, which are mildly hypermetabolic on PET scan in June 2025 -pt was seen by her radiation oncologist Dr. Shannon about her lung cancer and pancreatic cancer consolidation radiation on 05/18/2024 and decided observation only  Pt developed jaundice, ERCP attempted on 07/08/2024 and she underwent percutaneous biliary drain placement

## 2024-07-31 ENCOUNTER — Inpatient Hospital Stay: Admitting: Gynecologic Oncology

## 2024-07-31 ENCOUNTER — Inpatient Hospital Stay

## 2024-07-31 ENCOUNTER — Encounter: Payer: Self-pay | Admitting: Gynecologic Oncology

## 2024-07-31 DIAGNOSIS — D3910 Neoplasm of uncertain behavior of unspecified ovary: Secondary | ICD-10-CM

## 2024-07-31 DIAGNOSIS — C3411 Malignant neoplasm of upper lobe, right bronchus or lung: Secondary | ICD-10-CM | POA: Diagnosis not present

## 2024-07-31 DIAGNOSIS — C259 Malignant neoplasm of pancreas, unspecified: Secondary | ICD-10-CM

## 2024-07-31 NOTE — Progress Notes (Signed)
 Gynecologic Oncology Telehealth Note: Gyn-Onc  I connected with Tiffany [name] on 07/31/24 at  1:45 PM EDT by telephone and verified that I am speaking with the correct person using two identifiers.  I discussed the limitations, risks, security and privacy concerns of performing an evaluation and management service by telemedicine and the availability of in-person appointments. I also discussed with the patient that there may be a patient responsible charge related to this service. The patient expressed understanding and agreed to proceed.  Other persons participating in the visit and their role in the encounter: none.  Patient's location: home, [place] Provider's location: [facility]  Reason for Visit: follow-up  Treatment History: Oncology History  Adenocarcinoma of upper lobe of right lung (HCC)  08/06/2023 Initial Diagnosis   Adenocarcinoma of upper lobe of right lung (HCC)   08/06/2023 Cancer Staging   Staging form: Lung, AJCC 8th Edition - Clinical: Stage IB (cT2a, cN0, cM0) - Signed by Sherrod Sherrod, MD on 08/06/2023   10/14/2023 Genetic Testing   Negative genetic testing on the CancerNext-Expanded+RNAinsight.  The report date is 10/13/2023.  The CancerNext-Expanded gene panel offered by [employer] and includes sequencing and rearrangement analysis for the following 71 genes: AIP, ALK, APC, ATM, BAP1, BARD1, BMPR1A, BRCA1, BRCA2, BRIP1, CDC73, CDH1, CDK4, CDKN1B, CDKN2A, CHEK2, DICER1, FH, FLCN, KIF1B, LZTR1, MAX, MEN1, MET, MLH1, MSH2, MSH6, MUTYH, NF1, NF2, NTHL1, PALB2, PHOX2B, PMS2, POT1, PRKAR1A, PTCH1, PTEN, RAD51C, RAD51D, RB1, RET, SDHA, SDHAF2, SDHB, SDHC, SDHD, SMAD4, SMARCA4, SMARCB1, SMARCE1, STK11, SUFU, TMEM127, TP53, TSC1, TSC2 and VHL (sequencing and deletion/duplication); AXIN2, CTNNA1, EGFR, EGLN1, HOXB13, KIT, MITF, MSH3, PDGFRA, POLD1 and POLE (sequencing only); EPCAM and GREM1 (deletion/duplication only). RNA data is routinely analyzed for use in variant  interpretation for all genes.    Pancreatic cancer (HCC)  10/07/2023 Cancer Staging   Staging form: Exocrine Pancreas, AJCC 8th Edition - Clinical stage from 10/07/2023: Stage IB (cT2, cN0, cM0) - Signed by Lanny Callander, MD on 10/25/2023 Total positive nodes: 0   10/25/2023 Initial Diagnosis   Pancreatic cancer (HCC)   12/04/2023 -  Chemotherapy   [name] : PANCREATIC Abraxane  D1,8,15 + Gemcitabine  D1,8,15 q28d     03/05/2024 Imaging   CT CAP with contrast IMPRESSION: 1. Slightly diminished size of a hypodense mass of the pancreatic head, consistent with treatment response. No significant change in diffuse pancreatic ductal dilatation and atrophy of the remaining pancreatic parenchyma. 2. Subsolid lung lesion of the peripheral right upper lobe near a fiducial marker is essentially indistinguishable from background on today's examination with new subpleural consolidation consistent with developing radiation fibrosis. Findings consistent with treatment response of the lesion to local radiation therapy. 3. Multiple additional subsolid and predominantly solid nodular opacities, predominantly in the periphery of the right lung, several of which are increased in size and solid character, appearance and behavior most consistent with worsened multifocal pulmonary adenocarcinoma although pulmonary metastases not generally excluded in the setting of pancreatic adenocarcinoma. 4. Interval enlargement of a cystic lesion with some peripheral soft tissue in the left lower quadrant, of uncertain nature. This is possibly an unusual cystic metastasis or possibly of ovarian origin if there has been ovarian transposition. 5. Severe emphysema.      The patient had a recent CT scan in the setting of her COPD and lung cancer screening.  CT of the chest in early June showed a new part solid masslike lesion in the posterior segment right upper lobe concerning for possible malignancy.  PET scan was  performed on 7/15 and shows a mixed attenuation right upper lobe lung mass that is hypermetabolic, most consistent with primary bronchogenic carcinoma.  Incidental finding of a 12.4 cm partially solid, partially cystic mass in the anterior pelvis suspected to be arising from the ovary.  There is hypermetabolism cups corresponding to soft tissue fullness about the pancreatic head and descending duodenum, with differential diagnosis including pancreatic head carcinoma or focal pancreatitis.   The patient saw Dr. Shelah on 7/31.  She is going to be scheduled for navigational bronchoscopy (scheduled on 8/13) to evaluate her right upper lobe mass.  Referral was sent to GI in the setting of her pancreatic hypermetabolism.   Her history is notable for stage IA granulosa cell tumor diagnosed in 2007.  At that time, she underwent ex lap, bilateral salpingo-oophorectomy, pelvic lymphadenectomy, and omentectomy with Dr. Devonna.  Pathology revealed a 23 cm adult granulosa cell tumor of the left ovary with a benign fallopian tube.  Right tube and ovary were benign.  No involved pelvic lymph nodes.  Small bowel adhesions were submitted and negative for evidence of malignancy.  Inhibin levels at the time of her diagnosis were elevated to 77.  She was last seen in 2012 in our clinic and remained NED.   Inhibin B on 8/9: 1,006.3   07/18/23: Robotic-assisted radical dissection for pelvic mass, lysis of adhesions for approximately 10 minutes, cystoscopy, mini-lap for specimen delivery   Interval History: Doing ok. Having some pain under her breast. Endorses good bowel and bladder function. Breathing ok - has intermittent shortness of breath. Using 2L of oxygen  as needed. Eating and drinking better since recent procedure.  Urine lighter and almost back to normal. Fatigue similar.   Past Medical/Surgical History: Past Medical History:  Diagnosis Date   Abnormal PFT 09/02/2014   moderate restrictive ventilatory  defect.   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 Ivin   Colon polyp 12/2003   Diverticulosis 11/2010   seen on colonoscopy and air contrast BE   Emphysema of lung (HCC)    Family history of breast cancer    GERD (gastroesophageal reflux disease)    Heart valve disease    History of radiation therapy    Right Lung-09/04/23-09/09/23- Dr. Lynwood Nasuti   Hyperlipidemia    Hypertension    previously treated with meds, resolved   MGUS (monoclonal gammopathy of unknown significance) 1999   prevously under care of Dr. 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   Tobacco abuse    quit in 2019    Past Surgical History:  Procedure Laterality Date   BASAL CELL CARCINOMA EXCISION  11/26/2008   L face, R temple   BILATERAL SALPINGOOPHORECTOMY  11/26/2005   cancerous tumor R ovary   BIOPSY  10/07/2023   Procedure: BIOPSY;  Surgeon: Wilhelmenia Aloha Raddle., MD;  Location: [facility] ENDOSCOPY;  Service: Gastroenterology;;   BRONCHIAL BIOPSY  07/09/2023   Procedure: BRONCHIAL BIOPSIES;  Surgeon:  Shelah Lamar RAMAN, MD;  Location: [facility] ENDOSCOPY;  Service: Pulmonary;;   BRONCHIAL BIOPSY  04/07/2024   Procedure: BRONCHOSCOPY, WITH BIOPSY;  Surgeon: Shelah Lamar RAMAN, MD;  Location[facility] ENDOSCOPY;  Service: Pulmonary;;   BRONCHIAL BRUSHINGS  07/09/2023   Procedure: BRONCHIAL BRUSHINGS;  Surgeon: Shelah Lamar RAMAN, MD;  Location: [facility] ENDOSCOPY;  Service: Pulmonary;;   BRONCHIAL BRUSHINGS  04/07/2024   Procedure: BRONCHOSCOPY, WITH BRUSH BIOPSY;  Surgeon: Shelah Lamar RAMAN, MD;   Location: [facility] ENDOSCOPY;  Service: Pulmonary;;   BRONCHIAL NEEDLE ASPIRATION BIOPSY  07/09/2023   Procedure: BRONCHIAL NEEDLE ASPIRATION BIOPSIES;  Surgeon: Shelah Lamar RAMAN, MD;  Location: [facility] ENDOSCOPY;  Service: Pulmonary;;   BRONCHIAL NEEDLE ASPIRATION BIOPSY  04/07/2024   Procedure: BRONCHOSCOPY, WITH NEEDLE ASPIRATION BIOPSY;  Surgeon: Shelah Lamar RAMAN, MD;  Location: [facility] ENDOSCOPY;  Service: Pulmonary;;   CATARACT EXTRACTION  11/27/2007   bilateral   COLONOSCOPY  11/26/2010   Dr. Vicci. Diverticulosis. BE also done--normal   ERCP N/A 07/08/2024   Procedure: ERCP, WITH INTERVENTION IF INDICATED;  Surgeon: Jinny Carmine, MD;  Location: [facility] ENDOSCOPY;  Service: Endoscopy;  Laterality: N/A;  STENT PLACEMENT   ESOPHAGOGASTRODUODENOSCOPY (EGD) WITH PROPOFOL  N/A 10/07/2023   Procedure: ESOPHAGOGASTRODUODENOSCOPY (EGD) WITH PROPOFOL ;  Surgeon: Wilhelmenia Aloha Raddle., MD;  Location: [facility] ENDOSCOPY;  Service: Gastroenterology;  Laterality: N/A;   EUS N/A 10/07/2023   Procedure: UPPER ENDOSCOPIC ULTRASOUND (EUS) RADIAL;  Surgeon: Wilhelmenia Aloha Raddle., MD;  Location: [facility] ENDOSCOPY;  Service: Gastroenterology;  Laterality: N/A;   FIDUCIAL MARKER PLACEMENT  07/09/2023   Procedure: FIDUCIAL MARKER PLACEMENT;  Surgeon: Shelah Lamar RAMAN, MD;  Location: [facility] ENDOSCOPY;  Service: Pulmonary;;   FINE NEEDLE ASPIRATION N/A 10/07/2023   Procedure: FINE NEEDLE ASPIRATION (FNA) LINEAR;  Surgeon: Wilhelmenia Aloha Raddle., MD;  Location: [facility] ENDOSCOPY;  Service: Gastroenterology;  Laterality: N/A;   IR BILIARY STENT(S) NEW ACCESS W/O DRAIN  07/09/2024   OMENTECTOMY  11/26/2005   and pelvic lymphadenectomy (with BSO)   PORTACATH PLACEMENT N/A 11/22/2023   Procedure: INSERTION PORT-A-CATH;  Surgeon: Aron Shoulders, MD;  Location: [facility] OR;  Service: General;  Laterality: N/A;   ROBOTIC ASSISTED LAPAROSCOPIC LYSIS OF ADHESION N/A 07/18/2023   Procedure: ROBOTIC ASSISTED PELVIC MASS EXCISION WITH RADICAL DISSECTION, MINI  LAPAROTOMY, CYSTOSCOPY;  Surgeon: Viktoria Comer SAUNDERS, MD;  Location: [facility] ORS;  Service: Gynecology;  Laterality: N/A;   SKIN CANCER EXCISION Left 07/2021   neck; pt can't recall type of cancer   TONSILLECTOMY  age 73   VAGINAL HYSTERECTOMY  late 20's   didn't want any more children   VIDEO BRONCHOSCOPY WITH ENDOBRONCHIAL NAVIGATION Bilateral 04/07/2024   Procedure: VIDEO BRONCHOSCOPY WITH ENDOBRONCHIAL NAVIGATION;  Surgeon: Shelah Lamar RAMAN, MD;  Location: [facility] ENDOSCOPY;  Service: Pulmonary;  Laterality: Bilateral;  WITH FLUORO    Family History  Problem Relation Age of Onset   Stroke Mother    Cerebral aneurysm Mother    Hypertension Father    Suicidality Father    Diabetes Maternal Aunt    Diabetes Maternal Aunt    Breast cancer Maternal Aunt    Diabetes Maternal Aunt    Breast cancer Maternal Aunt    Throat cancer Maternal Uncle    Lung cancer Maternal Uncle    Colon cancer Neg Hx    Ovarian cancer Neg Hx    Endometrial cancer Neg Hx    Pancreatic cancer Neg Hx     Social History   Socioeconomic History   Marital status: Married    Spouse  name: Not on file   Number of children: 1   Years of education: Not on file   Highest education level: Not on file  Occupational History   Occupation: retired Warehouse manager company)    Employer: RETIRED  Tobacco Use   Smoking status: Former    Current packs/day: 0.00    Average packs/day: 2.0 packs/day for 59.8 years (119.6 ttl pk-yrs)    Types: Cigarettes    Start date: 31    Quit date: 09/10/2018    Years since quitting: 5.8    Passive exposure: Past (quit in 2019)   Smokeless tobacco: Never  Vaping Use   Vaping status: Some Days  Substance and Sexual Activity   Alcohol use: Not Currently    Comment: rarely   Drug use: No   Sexual activity: Not Currently    Partners: Male  Other Topics Concern   Not on file  Social History Narrative   Lives with husband, 1 cat.  Son stays with them some during the week, stays with  his girlfriend on the weekends (divorced).   1 grandson, 1 great grandson.      Updated 05/2024   Social Drivers of Health   Financial Resource Strain: Not on file  Food Insecurity: No Food Insecurity (07/09/2024)   Hunger Vital Sign    Worried About Running Out of Food in the Last Year: Never true    Ran Out of Food in the Last Year: Never true  Transportation Needs: No Transportation Needs (07/09/2024)   PRAPARE - Administrator, Civil Service (Medical): No    Lack of Transportation (Non-Medical): No  Physical Activity: Inactive (06/10/2024)   Exercise Vital Sign    Days of Exercise per Week: 0 days    Minutes of Exercise per Session: 0 min  Stress: No Stress Concern Present (06/10/2024)   [employer] of Occupational Health - Occupational Stress Questionnaire    Feeling of Stress: Not at all  Social Connections: Moderately Isolated (07/09/2024)   Social Connection and Isolation Panel    Frequency of Communication with Friends and Family: More than three times a week    Frequency of Social Gatherings with Friends and Family: More than three times a week    Attends Religious Services: 1 to 4 times per year    Active Member of [employer] or Organizations: No    Attends Banker Meetings: Never    Marital Status: Never married    Current Medications:  Current Outpatient Medications:    acetaminophen  (TYLENOL ) 500 MG tablet, Take 2 tablets (1,000 mg total) by mouth every 8 (eight) hours as needed for mild pain (pain score 1-3)., Disp: , Rfl:    acetaminophen -codeine  (TYLENOL  #3) 300-30 MG tablet, Take 1 tablet by mouth every 8 (eight) hours as needed for moderate pain (pain score 4-6)., Disp: 20 tablet, Rfl: 0   albuterol  (VENTOLIN  HFA) 108 (90 Base) MCG/ACT inhaler, INHALE 2 PUFFS BY MOUTH EVERY 6 HOURS AS NEEDED FOR WHEEZING FOR SHORTNESS OF BREATH . APPOINTMENT REQUIRED FOR FUTURE REFILLS, Disp: 9 g, Rfl: 8   cholecalciferol (VITAMIN D3) 25 MCG (1000 UNIT)  tablet, Take 1,000 Units by mouth daily., Disp: , Rfl:    Coenzyme Q10 (COQ10) 100 MG CAPS, Take 100 mg by mouth at bedtime., Disp: , Rfl:    lidocaine -prilocaine  (EMLA ) cream, Apply 1 Application topically as needed (for port access)., Disp: , Rfl:    losartan  (COZAAR ) 50 MG tablet, Take 1 tablet (50 mg  total) by mouth daily., Disp: 90 tablet, Rfl: 3   magic mouthwash (nystatin , lidocaine , diphenhydrAMINE , alum & mag hydroxide) suspension, Take 5 mLs by mouth 4 (four) times daily as needed for mouth pain., Disp: 400 mL, Rfl: 0   ondansetron  (ZOFRAN ) 8 MG tablet, Take 1 tablet (8 mg total) by mouth every 8 (eight) hours as needed for nausea or vomiting., Disp: 30 tablet, Rfl: 1   rosuvastatin  (CRESTOR ) 10 MG tablet, Take 1 tablet (10 mg total) by mouth at bedtime., Disp: 90 tablet, Rfl: 3   Tiotropium Bromide -Olodaterol (STIOLTO RESPIMAT ) 2.5-2.5 MCG/ACT AERS, INHALE 2 PUFFS BY MOUTH ONCE DAILY, Disp: 4 g, Rfl: 11  Review of Systems: See HPI.  Physical Exam: Deferred given limitations of phone visit.  Laboratory & Radiologic Studies: Component Ref Range & Units (hover) 4 mo ago 8 mo ago 1 yr ago  Inhibin B 45.0 High  61.4 High  CM 1,006.3 High     Assessment & Plan: Tiffany [name] is a 78 y.o. woman with recurrent granulosa cell tumor, lung cancer and pancreatic cancer. Recent imaging shows progression of her pancreatic cancer.  Was hospitalized in August for biliary duct stent with improvement in symptoms and LFTs.   Patient is overall doing well and feeling better than she was last month.  Plan is for repeat chest imaging later this month followed by an appointment with Dr. Phillip to discuss palliative radiation.  Decision has been to come off of chemotherapy given progression of her disease, significant symptomatology on treatment, and incurable cancer.    We discussed her history of granulosa cell tumor.  Overall, left pelvic cystic lesion was stable on her most recent imaging and  August.  While this could be related to her history of granulosa cell tumor, I favor that it is either benign or related to her pancreatic cancer.    From a follow-up standpoint, we discussed desire to limit extraneous visits or lab work.  I offered to have a follow-up phone visit with the patient in 3-4 months or that she could call me if any needs arise.  Her preference is to call me if needed.    I discussed the assessment and treatment plan with the patient. The patient was provided with an opportunity to ask questions and all were answered. The patient agreed with the plan and demonstrated an understanding of the instructions.   The patient was advised to call back or see an in-person evaluation if the symptoms worsen or if the condition fails to improve as anticipated.   14 minutes of total time was spent for this patient encounter, including preparation, phone counseling with the patient and coordination of care, and documentation of the encounter.   Comer Dollar, MD  Division of Gynecologic Oncology  Department of Obstetrics and Gynecology  [facility]

## 2024-08-18 ENCOUNTER — Ambulatory Visit (HOSPITAL_COMMUNITY)
Admission: RE | Admit: 2024-08-18 | Discharge: 2024-08-18 | Disposition: A | Discharge status: Home / Self Care | Source: Ambulatory Visit | Attending: Radiology | Admitting: Radiology

## 2024-08-18 DIAGNOSIS — J439 Emphysema, unspecified: Secondary | ICD-10-CM | POA: Diagnosis not present

## 2024-08-18 DIAGNOSIS — C269 Malignant neoplasm of ill-defined sites within the digestive system: Secondary | ICD-10-CM | POA: Diagnosis not present

## 2024-08-18 DIAGNOSIS — C3411 Malignant neoplasm of upper lobe, right bronchus or lung: Secondary | ICD-10-CM | POA: Diagnosis not present

## 2024-08-18 DIAGNOSIS — J8489 Other specified interstitial pulmonary diseases: Secondary | ICD-10-CM | POA: Diagnosis not present

## 2024-08-18 DIAGNOSIS — C3490 Malignant neoplasm of unspecified part of unspecified bronchus or lung: Secondary | ICD-10-CM | POA: Diagnosis not present

## 2024-08-20 DIAGNOSIS — J449 Chronic obstructive pulmonary disease, unspecified: Secondary | ICD-10-CM | POA: Diagnosis not present

## 2024-08-22 NOTE — Progress Notes (Incomplete)
 Radiation Oncology         [phone] ________________________________  Name: Tiffany [name] MRN: [id number]  Date: 08/24/2024  DOB: 06-08-1946  Follow-Up Visit Note  CC: [name], MD  [name], MD  No diagnosis found.  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   Presented with right upper lobe hypermetabolic lung mass as well as an additional suspicious right lower lobe lung nodule that was not hypermetabolic on PET imaging. 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    Presented with right upper lobe hypermetabolic lung mass as well as an additional suspicious right lower lobe lung nodule that was not hypermetabolic on PET imaging. 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 She was last seen here for follow-up on 05/18/24.      In the interval since her last visit, she has continued to follow with Dr. Lanny. She was noted to be doing well and clinically stable during a routine follow-up visit with Dr. Lanny on 06/30/24.    In the setting of her history of pancreatic cancer, she presented for a CT AP with contrast on 07/03/24 which unfortunately  showed: enlargement of the pancreatic head primary with development of intra and extrahepatic biliary duct dilatation; cholelithiasis with new gallbladder distension, likely due to the pancreatic head obstruction; progressive pancreatic duct dilatation; enlargement of an 8 mm aortocaval node for which early nodal metastasis could not be excluded; a progressive right base subpleural nodularity, favoring pulmonary metastasis; and a similar appearing left pelvic cystic lesion which likely represents an incidental left ovarian primary versus metastasis.   Dr. Lanny reviewed these findings with the patient on 07/06/24. As previously noted, the patient is not interested in pursing any form of chemotherapy. Dr. Lanny as subsequently placed a referral to palliative care.   She was then promptly referred to GI and underwent ERCP and stent placement (for distal common bile duct malignant obstruction) on 07/08/24 under the care of Dr. Jinny.     Dr. Lanny has discussed considering radiation therapy to slow her disease which we will discuss in detail today. If she is not interested in pursing palliative radiation therapy, Dr. Lanny will refer her to hospice to set home care.     Her most recent chest CT on 08/18/24 demonstrates: a significant increase in size and number of the multiple bilateral dense subpleural consolidations throughout both lungs favoring worsened metastatic disease (although drug toxicity (i.e. organizing pneumonia morphology) is a differential for this finding given a somewhat unusual presentation for pulmonary metastases); a new indeterminate lobulated, fluid attenuation subpleural lesion of the anterior RUL measuring 4.8 x 4.0 cm (with a drastically different morphology than the bilateral lung consolidations); a slight increase in size of the lobulated subpleural lesion  of the anterior LUL, measuring 4.9 x 3.9 cm (previously 4.3 x 3.3 cm); and stability of the dense post radiation subpleural  consolidation along the peripheral right lung but with a new associated nodule in the peripheral RLL measuring 0.8 cm, presumably representing recurrent or metastatic disease.     Of note: In the setting of her history of granulosa cell tumor, she was also recently seen by Dr. Viktoria on 07/31/24. Given her current status, she will continue to follow-up with Dr. Viktoria on an as needed basis (so as to limit extraneous visits or lab work).                     ***  Allergies:  has no known allergies.  Meds: Current Outpatient Medications  Medication Sig Dispense Refill   acetaminophen  (TYLENOL ) 500 MG tablet Take 2 tablets (1,000 mg total) by mouth every 8 (eight) hours as needed for mild pain (pain score 1-3).     acetaminophen -codeine  (TYLENOL  #3) 300-30 MG tablet Take 1 tablet by mouth every 8 (eight) hours as needed for moderate pain (pain score 4-6). 20 tablet 0   albuterol  (VENTOLIN  HFA) 108 (90 Base) MCG/ACT inhaler INHALE 2 PUFFS BY MOUTH EVERY 6 HOURS AS NEEDED FOR WHEEZING FOR SHORTNESS OF BREATH . APPOINTMENT REQUIRED FOR FUTURE REFILLS 9 g 8   cholecalciferol (VITAMIN D3) 25 MCG (1000 UNIT) tablet Take 1,000 Units by mouth daily.     Coenzyme Q10 (COQ10) 100 MG CAPS Take 100 mg by mouth at bedtime.     lidocaine -prilocaine  (EMLA ) cream Apply 1 Application topically as needed (for port access).     losartan  (COZAAR ) 50 MG tablet Take 1 tablet (50 mg total) by mouth daily. 90 tablet 3   magic mouthwash (nystatin , lidocaine , diphenhydrAMINE , alum & mag hydroxide) suspension Take 5 mLs by mouth 4 (four) times daily as needed for mouth pain. 400 mL 0   ondansetron  (ZOFRAN ) 8 MG tablet Take 1 tablet (8 mg total) by mouth every 8 (eight) hours as needed for nausea or vomiting. 30 tablet 1   rosuvastatin  (CRESTOR ) 10 MG tablet Take 1 tablet (10 mg total) by mouth at bedtime. 90 tablet 3   Tiotropium Bromide -Olodaterol (STIOLTO RESPIMAT ) 2.5-2.5 MCG/ACT AERS INHALE 2 PUFFS BY MOUTH ONCE DAILY 4 g  11   No current facility-administered medications for this encounter.    Physical Findings: The patient is in no acute distress. Patient is alert and oriented.  vitals were not taken for this visit. .  No significant changes. Lungs are clear to auscultation bilaterally. Heart has regular rate and rhythm. No palpable cervical, supraclavicular, or axillary adenopathy. Abdomen soft, non-tender, normal bowel sounds.   Lab Findings: Lab Results  Component Value Date   WBC 4.4 07/30/2024   HGB 10.9 (L) 07/30/2024   HCT 32.8 (L) 07/30/2024   MCV 95.6 07/30/2024   PLT 162 07/30/2024    Radiographic Findings: CT CHEST WO CONTRAST Result Date: 08/20/2024 CLINICAL DATA:  Non-small-cell lung cancer restaging, additional history of pancreatic cancer * Tracking Code: BO * EXAM: CT CHEST WITHOUT CONTRAST TECHNIQUE: Multidetector CT imaging of the chest was performed following the standard protocol without IV contrast. RADIATION DOSE REDUCTION: This exam was performed according to the departmental dose-optimization program which includes automated exposure control, adjustment of the mA and/or kV according to patient size and/or use of iterative reconstruction technique. COMPARISON:  PET-CT, 05/04/2024 FINDINGS: Cardiovascular: Left chest port catheter. Aortic atherosclerosis. Normal heart size. No pericardial effusion.  Mediastinum/Nodes: No enlarged mediastinal, hilar, or axillary lymph nodes. Thyroid  gland, trachea, and esophagus demonstrate no significant findings. Lungs/Pleura: Severe emphysema. Diffuse bilateral bronchial wall thickening. Lobulated, fluid attenuation subpleural lesion of the anterior left upper lobe, slightly increased in size compared to prior examination, measuring 4.9 x 3.9 cm, previously 4.3 x 3.3 cm (series 2, image 37). New lobulated, fluid attenuation subpleural lesion of the anterior right upper lobe measuring 4.8 x 4.0 cm (series 8, image 60). Similar dense subpleural  consolidation along the peripheral right lung with an adjacent biopsy marker (series 8, image 89). New associated nodule in the peripheral right lower lobe measuring 0.8 x 0.8 cm (series 8, image 90). Multiple bilateral dense subpleural consolidations throughout both lungs are significantly increased in size and number, for example in the dependent right lower lobe measuring 5.1 x 2.7 cm, previously subsolid and measuring 2.1 x 1.6 cm (series 8, image 108). No pleural effusion or pneumothorax. Upper Abdomen: Post stenting pneumobilia. Musculoskeletal: No chest wall abnormality. No acute osseous findings. IMPRESSION: 1. Multiple bilateral dense subpleural consolidations throughout both lungs are significantly increased in size and number. Findings are generally most consistent with worsened metastatic disease, although of uncertain primary origin in this patient with multiple known malignancies. Given somewhat unusual character for pulmonary metastases, drug toxicity (i.e. organizing pneumonia morphology) is a differential consideration. 2. New lobulated, fluid attenuation subpleural lesion of the anterior right upper lobe measuring 4.8 x 4.0 cm. This is of uncertain nature, possibly reflecting postobstructive consolidation and/or metastatic disease, substantially different morphology than consolidations described above. 3. Lobulated, fluid attenuation subpleural lesion of the anterior left upper lobe, slightly increased in size compared to prior examination, measuring 4.9 x 3.9 cm, previously 4.3 x 3.3 cm. Similar morphology and differential considerations to right upper lobe lesion above. 4. Similar dense post radiation subpleural consolidation along the peripheral right lung with an adjacent biopsy marker. New associated nodule in the peripheral right lower lobe measuring 0.8 x 0.8 cm, presumably recurrent or metastatic disease. 5. Severe emphysema and diffuse bilateral bronchial wall thickening. Aortic  Atherosclerosis (ICD10-I70.0) and Emphysema (ICD10-J43.9). Electronically Signed   By: Marolyn JONETTA Jaksch M.D.   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   The patient is recovering from the effects of radiation.  ***  Plan:  ***   *** minutes of total time was spent for this patient encounter, including preparation, face-to-face counseling with the patient and coordination of care, physical exam, and documentation of the encounter. ____________________________________  Lynwood CHARM Nasuti, PhD, MD  This document serves as a record of services personally performed by Lynwood Nasuti, MD. It was created on his behalf by Dorthy Fuse, a trained medical scribe. The creation of this record is based on the scribe's personal observations and the provider's statements to them. This document has been checked and approved by the attending provider.

## 2024-08-24 ENCOUNTER — Ambulatory Visit
Admission: RE | Admit: 2024-08-24 | Discharge: 2024-08-24 | Disposition: A | Discharge status: Home / Self Care | Payer: Self-pay | Source: Ambulatory Visit | Attending: Radiation Oncology | Admitting: Radiation Oncology

## 2024-08-24 ENCOUNTER — Encounter: Payer: Self-pay | Admitting: Radiation Oncology

## 2024-08-24 ENCOUNTER — Other Ambulatory Visit: Payer: Self-pay | Admitting: Hematology

## 2024-08-24 VITALS — BP 123/67 | HR 76 | Temp 97.7°F | Resp 20 | Ht 65.0 in | Wt 100.4 lb

## 2024-08-24 DIAGNOSIS — Z923 Personal history of irradiation: Secondary | ICD-10-CM | POA: Diagnosis not present

## 2024-08-24 DIAGNOSIS — C3411 Malignant neoplasm of upper lobe, right bronchus or lung: Secondary | ICD-10-CM | POA: Insufficient documentation

## 2024-08-24 DIAGNOSIS — C259 Malignant neoplasm of pancreas, unspecified: Secondary | ICD-10-CM | POA: Diagnosis not present

## 2024-08-24 DIAGNOSIS — Z87891 Personal history of nicotine dependence: Secondary | ICD-10-CM | POA: Diagnosis not present

## 2024-08-24 NOTE — Progress Notes (Signed)
 Tiffany [name] is here today for follow up post radiation to the lung.  Lung Side: Right, patient completed treatment on 09/09/23.   Does the patient complain of any of the following: Pain: No Shortness of breath w/wo exertion: Yes, patient continues to use oxygen  @ 1-2 liters prn.  Cough: Yes- productive Hemoptysis: No Pain with swallowing: No  Swallowing/choking concerns: No Appetite: Fair, patient drinking one ensure daily.  Weight:  Wt Readings from Last 3 Encounters:  08/24/24 100 lb 6.4 oz (45.5 kg)  07/30/24 105 lb 12.8 oz (48 kg)  07/09/24 107 lb 5.8 oz (48.7 kg)    Energy Level: Fair Post radiation skin Changes: No    Additional comments if applicable:  BP 123/67 (BP Location: Left Arm, Patient Position: Sitting, Cuff Size: Small)   Pulse 76   Temp 97.7 F (36.5 C)   Resp 20   Ht 5' 5 (1.651 m)   Wt 100 lb 6.4 oz (45.5 kg)   SpO2 100%   PF (!) 1 L/min   BMI 16.71 kg/m

## 2024-08-27 ENCOUNTER — Other Ambulatory Visit: Payer: Self-pay | Admitting: Nurse Practitioner

## 2024-08-27 ENCOUNTER — Encounter: Payer: Self-pay | Admitting: Hematology

## 2024-08-27 MED ORDER — ACETAMINOPHEN-CODEINE 300-30 MG PO TABS: 1.0000 | ORAL_TABLET | Freq: Three times a day (TID) | ORAL | 0 refills | Status: DC | PRN

## 2024-09-03 ENCOUNTER — Ambulatory Visit
Admission: RE | Admit: 2024-09-03 | Discharge: 2024-09-03 | Disposition: A | Discharge status: Home / Self Care | Source: Ambulatory Visit | Attending: Radiation Oncology | Admitting: Radiation Oncology

## 2024-09-03 VITALS — BP 112/60 | HR 80 | Temp 97.6°F | Resp 20 | Ht 65.0 in | Wt 102.2 lb

## 2024-09-03 DIAGNOSIS — C3411 Malignant neoplasm of upper lobe, right bronchus or lung: Secondary | ICD-10-CM | POA: Insufficient documentation

## 2024-09-03 DIAGNOSIS — C250 Malignant neoplasm of head of pancreas: Secondary | ICD-10-CM | POA: Diagnosis not present

## 2024-09-03 LAB — CMP (CANCER CENTER ONLY)
ALT: 12 U/L (ref 0–44)
AST: 16 U/L (ref 15–41)
Albumin: 3.5 g/dL (ref 3.5–5.0)
Alkaline Phosphatase: 123 U/L (ref 38–126)
Anion gap: 3 — ABNORMAL LOW (ref 5–15)
BUN: 10 mg/dL (ref 8–23)
CO2: 31 mmol/L (ref 22–32)
Calcium: 9.2 mg/dL (ref 8.9–10.3)
Chloride: 101 mmol/L (ref 98–111)
Creatinine: 0.57 mg/dL (ref 0.44–1.00)
GFR, Estimated: >60 mL/min (ref >60)
Glucose, Bld: 207 mg/dL — ABNORMAL HIGH (ref 70–99)
Potassium: 4 mmol/L (ref 3.5–5.1)
Sodium: 135 mmol/L (ref 135–145)
Total Bilirubin: 0.9 mg/dL (ref 0.0–1.2)
Total Protein: 7.1 g/dL (ref 6.5–8.1)

## 2024-09-03 LAB — CBC WITH DIFFERENTIAL (CANCER CENTER ONLY)
Abs Immature Granulocytes: 0.01 K/uL (ref 0.00–0.07)
Basophils Absolute: 0 K/uL (ref 0.0–0.1)
Basophils Relative: 1 %
Eosinophils Absolute: 0.1 K/uL (ref 0.0–0.5)
Eosinophils Relative: 3 %
HCT: 36.6 % (ref 36.0–46.0)
Hemoglobin: 12.5 g/dL (ref 12.0–15.0)
Immature Granulocytes: 0 %
Lymphocytes Relative: 15 %
Lymphs Abs: 0.8 K/uL (ref 0.7–4.0)
MCH: 31.4 pg (ref 26.0–34.0)
MCHC: 34.2 g/dL (ref 30.0–36.0)
MCV: 92 fL (ref 80.0–100.0)
Monocytes Absolute: 0.5 K/uL (ref 0.1–1.0)
Monocytes Relative: 9 %
Neutro Abs: 4.1 K/uL (ref 1.7–7.7)
Neutrophils Relative %: 72 %
Platelet Count: 145 K/uL — ABNORMAL LOW (ref 150–400)
RBC: 3.98 MIL/uL (ref 3.87–5.11)
RDW: 12.8 % (ref 11.5–15.5)
WBC Count: 5.6 K/uL (ref 4.0–10.5)
nRBC: 0 % (ref 0.0–0.2)

## 2024-09-03 NOTE — Progress Notes (Signed)
 Has armband been applied?  Yes.    Does patient have an allergy to IV contrast dye?: No.   Has patient ever received premedication for IV contrast dye?: No.     Date of lab work: September 03 2024 BUN: 10 CR: .57 eGfr: >60  IV site: port accessed   Has IV site been added to flowsheet?  Yes.    BP 112/60 (BP Location: Right Arm, Patient Position: Sitting, Cuff Size: Normal)   Pulse 80   Temp 97.6 F (36.4 C)   Resp 20   Ht 5' 5 (1.651 m)   Wt 102 lb 3.2 oz (46.4 kg)   SpO2 93%   BMI 17.01 kg/m

## 2024-09-09 DIAGNOSIS — C3411 Malignant neoplasm of upper lobe, right bronchus or lung: Secondary | ICD-10-CM | POA: Diagnosis not present

## 2024-09-09 DIAGNOSIS — C250 Malignant neoplasm of head of pancreas: Secondary | ICD-10-CM | POA: Diagnosis not present

## 2024-09-10 ENCOUNTER — Ambulatory Visit
Admission: RE | Admit: 2024-09-10 | Discharge: 2024-09-10 | Disposition: A | Discharge status: Home / Self Care | Source: Ambulatory Visit | Attending: Radiation Oncology | Admitting: Radiation Oncology

## 2024-09-10 ENCOUNTER — Inpatient Hospital Stay: Attending: Gynecologic Oncology

## 2024-09-10 ENCOUNTER — Inpatient Hospital Stay (HOSPITAL_BASED_OUTPATIENT_CLINIC_OR_DEPARTMENT_OTHER): Admitting: Hematology

## 2024-09-10 ENCOUNTER — Other Ambulatory Visit: Payer: Self-pay

## 2024-09-10 VITALS — BP 130/68 | HR 77 | Temp 98.3°F | Resp 18 | Ht 65.0 in | Wt 105.7 lb

## 2024-09-10 DIAGNOSIS — Z79899 Other long term (current) drug therapy: Secondary | ICD-10-CM | POA: Insufficient documentation

## 2024-09-10 DIAGNOSIS — E785 Hyperlipidemia, unspecified: Secondary | ICD-10-CM | POA: Diagnosis not present

## 2024-09-10 DIAGNOSIS — Z9849 Cataract extraction status, unspecified eye: Secondary | ICD-10-CM | POA: Insufficient documentation

## 2024-09-10 DIAGNOSIS — M8580 Other specified disorders of bone density and structure, unspecified site: Secondary | ICD-10-CM | POA: Diagnosis not present

## 2024-09-10 DIAGNOSIS — Z510 Encounter for antineoplastic radiation therapy: Secondary | ICD-10-CM | POA: Diagnosis not present

## 2024-09-10 DIAGNOSIS — Z85828 Personal history of other malignant neoplasm of skin: Secondary | ICD-10-CM | POA: Diagnosis not present

## 2024-09-10 DIAGNOSIS — Z90722 Acquired absence of ovaries, bilateral: Secondary | ICD-10-CM | POA: Diagnosis not present

## 2024-09-10 DIAGNOSIS — Z860100 Personal history of colon polyps, unspecified: Secondary | ICD-10-CM | POA: Insufficient documentation

## 2024-09-10 DIAGNOSIS — Z803 Family history of malignant neoplasm of breast: Secondary | ICD-10-CM | POA: Insufficient documentation

## 2024-09-10 DIAGNOSIS — I10 Essential (primary) hypertension: Secondary | ICD-10-CM | POA: Insufficient documentation

## 2024-09-10 DIAGNOSIS — J439 Emphysema, unspecified: Secondary | ICD-10-CM | POA: Diagnosis not present

## 2024-09-10 DIAGNOSIS — C250 Malignant neoplasm of head of pancreas: Secondary | ICD-10-CM

## 2024-09-10 DIAGNOSIS — C7801 Secondary malignant neoplasm of right lung: Secondary | ICD-10-CM | POA: Diagnosis not present

## 2024-09-10 DIAGNOSIS — G893 Neoplasm related pain (acute) (chronic): Secondary | ICD-10-CM | POA: Diagnosis not present

## 2024-09-10 DIAGNOSIS — Z8701 Personal history of pneumonia (recurrent): Secondary | ICD-10-CM | POA: Insufficient documentation

## 2024-09-10 DIAGNOSIS — K862 Cyst of pancreas: Secondary | ICD-10-CM | POA: Insufficient documentation

## 2024-09-10 DIAGNOSIS — Z90710 Acquired absence of both cervix and uterus: Secondary | ICD-10-CM | POA: Diagnosis not present

## 2024-09-10 DIAGNOSIS — Z923 Personal history of irradiation: Secondary | ICD-10-CM | POA: Diagnosis not present

## 2024-09-10 DIAGNOSIS — C3411 Malignant neoplasm of upper lobe, right bronchus or lung: Secondary | ICD-10-CM

## 2024-09-10 LAB — RAD ONC ARIA SESSION SUMMARY
Course Elapsed Days: 0
Plan Fractions Treated to Date: 1
Plan Prescribed Dose Per Fraction: 2.5 Gy
Plan Total Fractions Prescribed: 15
Plan Total Prescribed Dose: 37.5 Gy
Reference Point Dosage Given to Date: 2.5 Gy
Reference Point Session Dosage Given: 2.5 Gy
Session Number: 1

## 2024-09-10 LAB — CMP (CANCER CENTER ONLY)
ALT: 11 U/L (ref 0–44)
AST: 16 U/L (ref 15–41)
Albumin: 3.4 g/dL — ABNORMAL LOW (ref 3.5–5.0)
Alkaline Phosphatase: 115 U/L (ref 38–126)
Anion gap: 3 — ABNORMAL LOW (ref 5–15)
BUN: 10 mg/dL (ref 8–23)
CO2: 31 mmol/L (ref 22–32)
Calcium: 9.2 mg/dL (ref 8.9–10.3)
Chloride: 102 mmol/L (ref 98–111)
Creatinine: 0.58 mg/dL (ref 0.44–1.00)
GFR, Estimated: >60 mL/min
Glucose, Bld: 119 mg/dL — ABNORMAL HIGH (ref 70–99)
Potassium: 3.7 mmol/L (ref 3.5–5.1)
Sodium: 136 mmol/L (ref 135–145)
Total Bilirubin: 0.8 mg/dL (ref 0.0–1.2)
Total Protein: 6.7 g/dL (ref 6.5–8.1)

## 2024-09-10 LAB — CBC WITH DIFFERENTIAL (CANCER CENTER ONLY)
Abs Immature Granulocytes: 0.02 K/uL (ref 0.00–0.07)
Basophils Absolute: 0 K/uL (ref 0.0–0.1)
Basophils Relative: 1 %
Eosinophils Absolute: 0.1 K/uL (ref 0.0–0.5)
Eosinophils Relative: 2 %
HCT: 36.3 % (ref 36.0–46.0)
Hemoglobin: 12.1 g/dL (ref 12.0–15.0)
Immature Granulocytes: 0 %
Lymphocytes Relative: 19 %
Lymphs Abs: 1.1 K/uL (ref 0.7–4.0)
MCH: 31.1 pg (ref 26.0–34.0)
MCHC: 33.3 g/dL (ref 30.0–36.0)
MCV: 93.3 fL (ref 80.0–100.0)
Monocytes Absolute: 0.5 K/uL (ref 0.1–1.0)
Monocytes Relative: 8 %
Neutro Abs: 4 K/uL (ref 1.7–7.7)
Neutrophils Relative %: 70 %
Platelet Count: 152 K/uL (ref 150–400)
RBC: 3.89 MIL/uL (ref 3.87–5.11)
RDW: 12.5 % (ref 11.5–15.5)
WBC Count: 5.6 K/uL (ref 4.0–10.5)
nRBC: 0 % (ref 0.0–0.2)

## 2024-09-10 MED ORDER — ACETAMINOPHEN-CODEINE 300-30 MG PO TABS: 1.0000 | ORAL_TABLET | Freq: Three times a day (TID) | ORAL | 0 refills | Status: DC | PRN

## 2024-09-10 NOTE — Assessment & Plan Note (Signed)
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  -PET scan from June 10, 2023 showed a hypermetabolic 3.1 cm mass in the right upper lobe, and hypermetabolic soft tissue fullness in the pancreatic head, and partially solid and cystic 12.4 cm pelvic mass. 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 Viktoria  -baseline CA19.9 33 -Pt has seen by pancreatobiliary surgeon Dr. Aron, who recommends neoadjuvant chemotherapy due to the uncertainty of lung metastasis.  Patient declined surgery. -Repeated CT on November 01, 2023 showed improved treated lung mass, but several new or enlarging lung nodules, concerning for metastatic disease.  -Patient is agreeable with chemo, she started gemcitabine  and Abraxane  on 12/04/2023 -CT 03/05/24 showed slightly decreased pancreatic mass, and a radiation changes to her lung lesion.  However she has multiple other new pulmonary nodules on the CT, which are mildly hypermetabolic on PET scan in June 2025 -pt was seen by her radiation oncologist Dr. Shannon about her lung cancer and pancreatic cancer consolidation radiation on 05/18/2024 and decided observation only  Pt developed jaundice, ERCP attempted on 07/08/2024 and she underwent percutaneous biliary drain placement -CT in 08/2024 showed disease progression. I recommend hospice

## 2024-09-10 NOTE — Progress Notes (Signed)
 [facility]   [phone] [phone]   Clinic Follow up Note   Patient Care Team: [name], MD as PCP - General (Family Medicine) Kassie Acquanetta Bradley, MD as Consulting Physician (Pulmonary Disease) Cleotilde Carlin Lenis, OD as Referring Physician (Optometry) Anderson, Dena D, PA-C (Physician Assistant) Lanny Callander, MD as Consulting Physician (Hematology and Oncology) Hanford Powell BRAVO, NP as Nurse Practitioner (Hematology and Oncology)  Date of Service:  09/10/2024  CHIEF COMPLAINT: f/u of pancreatic cancer  CURRENT THERAPY:  Palliative radiation  Oncology History   Pancreatic cancer (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  -PET scan from June 10, 2023 showed a hypermetabolic 3.1 cm mass in the right upper lobe, and hypermetabolic soft tissue fullness in the pancreatic head, and partially solid and cystic 12.4 cm pelvic mass. 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 Viktoria  -baseline CA19.9 33 -Pt has seen by pancreatobiliary surgeon Dr. Aron, who recommends neoadjuvant chemotherapy due to the uncertainty of lung metastasis.  Patient declined surgery. -Repeated CT on November 01, 2023 showed improved treated lung mass, but several new or enlarging lung nodules, concerning for metastatic disease.  -Patient is agreeable with chemo, she started gemcitabine  and Abraxane  on 12/04/2023 -CT 03/05/24 showed slightly decreased pancreatic mass, and a radiation changes to her lung lesion.  However she has multiple other new pulmonary nodules on the CT, which are mildly hypermetabolic on PET scan in June 2025 -pt was seen by her radiation oncologist Dr. Shannon about her lung cancer and pancreatic cancer consolidation radiation on 05/18/2024  and decided observation only  Pt developed jaundice, ERCP attempted on 07/08/2024 and she underwent percutaneous biliary drain placement -CT in 08/2024 showed disease progression. I recommend hospice   Assessment & Plan Metastatic pancreatic cancer with pulmonary metastases Recent CT scan on August 18, 2024, showed increased metastasis. Radiation therapy is being initiated as a palliative measure to alleviate pain, not to cure the cancer, as it has spread to multiple locations. The cancer is not localized, and radiation is unlikely to prolong life but may improve quality of life by reducing pain. Chemotherapy is not recommended due to her weakened condition, as it may worsen her health. - Initiate radiation therapy with a schedule of 15 sessions, ending on September 30, 2024. - Discuss the potential benefits of hospice care for convenience and comprehensive home care, though she is hesitant due to the perception of hospice as end-of-life care.  Neoplasm-related pain Experiencing abdominal pain related to metastatic pancreatic cancer. Pain is currently managed with Tylenol  #3, which she reports as effective. Radiation therapy is also being considered to help alleviate pain. - Refill Tylenol  #3 prescription with 60 tablets.  Plan - I personally reviewed her recent CT scan, which showed disease progression in lungs - She is is starting palliative radiation today, and will complete on November 5 - We discussed hospice care after radiation, she wants to think about it. - Follow-up with me on November 4 or 5   SUMMARY OF ONCOLOGIC HISTORY: Oncology History  Adenocarcinoma of upper lobe of right lung (HCC)  08/06/2023 Initial Diagnosis   Adenocarcinoma of upper lobe of right lung (HCC)   08/06/2023 Cancer Staging   Staging form: Lung, AJCC 8th Edition - Clinical: Stage IB (cT2a, cN0, cM0) -  Signed by Sherrod Sherrod, MD on 08/06/2023   10/14/2023 Genetic Testing   Negative genetic testing on  the CancerNext-Expanded+RNAinsight.  The report date is 10/13/2023.  The CancerNext-Expanded gene panel offered by [employer] and includes sequencing and rearrangement analysis for the following 71 genes: AIP, ALK, APC, ATM, BAP1, BARD1, BMPR1A, BRCA1, BRCA2, BRIP1, CDC73, CDH1, CDK4, CDKN1B, CDKN2A, CHEK2, DICER1, FH, FLCN, KIF1B, LZTR1, MAX, MEN1, MET, MLH1, MSH2, MSH6, MUTYH, NF1, NF2, NTHL1, PALB2, PHOX2B, PMS2, POT1, PRKAR1A, PTCH1, PTEN, RAD51C, RAD51D, RB1, RET, SDHA, SDHAF2, SDHB, SDHC, SDHD, SMAD4, SMARCA4, SMARCB1, SMARCE1, STK11, SUFU, TMEM127, TP53, TSC1, TSC2 and VHL (sequencing and deletion/duplication); AXIN2, CTNNA1, EGFR, EGLN1, HOXB13, KIT, MITF, MSH3, PDGFRA, POLD1 and POLE (sequencing only); EPCAM and GREM1 (deletion/duplication only). RNA data is routinely analyzed for use in variant interpretation for all genes.    Pancreatic cancer (HCC)  10/07/2023 Cancer Staging   Staging form: Exocrine Pancreas, AJCC 8th Edition - Clinical stage from 10/07/2023: Stage IB (cT2, cN0, cM0) - Signed by Lanny Callander, MD on 10/25/2023 Total positive nodes: 0   10/25/2023 Initial Diagnosis   Pancreatic cancer (HCC)   12/04/2023 -  Chemotherapy   [name] : PANCREATIC Abraxane  D1,8,15 + Gemcitabine  D1,8,15 q28d     03/05/2024 Imaging   CT CAP with contrast IMPRESSION: 1. Slightly diminished size of a hypodense mass of the pancreatic head, consistent with treatment response. No significant change in diffuse pancreatic ductal dilatation and atrophy of the remaining pancreatic parenchyma. 2. Subsolid lung lesion of the peripheral right upper lobe near a fiducial marker is essentially indistinguishable from background on today's examination with new subpleural consolidation consistent with developing radiation fibrosis. Findings consistent with treatment response of the lesion to local radiation therapy. 3. Multiple additional subsolid and predominantly solid nodular opacities,  predominantly in the periphery of the right lung, several of which are increased in size and solid character, appearance and behavior most consistent with worsened multifocal pulmonary adenocarcinoma although pulmonary metastases not generally excluded in the setting of pancreatic adenocarcinoma. 4. Interval enlargement of a cystic lesion with some peripheral soft tissue in the left lower quadrant, of uncertain nature. This is possibly an unusual cystic metastasis or possibly of ovarian origin if there has been ovarian transposition. 5. Severe emphysema.        Discussed the use of AI scribe software for clinical note transcription with the patient, who gave verbal consent to proceed.  History of Present Illness Tiffany [name] is a 78 year old female with pancreatic and lung cancer who presents for follow-up. She is accompanied by a friend.  She is undergoing radiation therapy, which started today, with a total of fifteen sessions planned, concluding on September 30, 2024. There is uncertainty about whether the radiation targets her lungs or pancreas, following lung radiation last year. A CT scan on August 18, 2024, showed lung metastasis.  She experiences stomach pain, relieved by Tylenol  #3 once or twice daily, typically after breakfast. She has gained three pounds recently.     All other systems were reviewed with the patient and are negative.  MEDICAL HISTORY:  Past Medical History:  Diagnosis Date   Abnormal PFT 09/02/2014   moderate restrictive ventilatory defect.   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 Ivin   Colon polyp 12/2003   Diverticulosis 11/2010  seen on colonoscopy and air contrast BE   Emphysema of lung (HCC)    Family history of breast cancer    GERD (gastroesophageal reflux disease)    Heart valve disease    History of radiation  therapy    Right Lung-09/04/23-09/09/23- Dr. Lynwood Nasuti   Hyperlipidemia    Hypertension    previously treated with meds, resolved   MGUS (monoclonal gammopathy of unknown significance) 1999   prevously under care of Dr. 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   Tobacco abuse    quit in 2019    SURGICAL HISTORY: Past Surgical History:  Procedure Laterality Date   BASAL CELL CARCINOMA EXCISION  11/26/2008   L face, R temple   BILATERAL SALPINGOOPHORECTOMY  11/26/2005   cancerous tumor R ovary   BIOPSY  10/07/2023   Procedure: BIOPSY;  Surgeon: Wilhelmenia Aloha Raddle., MD;  Location: [facility];  Service: Gastroenterology;;   BRONCHIAL BIOPSY  07/09/2023   Procedure: BRONCHIAL BIOPSIES;  Surgeon: Shelah Lamar RAMAN, MD;  Location: [facility]COPY;  Service: Pulmonary;;   BRONCHIAL BIOPSY  04/07/2024   Procedure: BRONCHOSCOPY, WITH BIOPSY;  Surgeon: Shelah Lamar RAMAN, MD;  Location: [facility];  Service: Pulmonary;;   BRONCHIAL BRUSHINGS  07/09/2023   Procedure: BRONCHIAL BRUSHINGS;  Surgeon: Shelah Lamar RAMAN, MD;  Location: [facility];  Service: Pulmonary;;   BRONCHIAL BRUSHINGS  04/07/2024   Procedure: BRONCHOSCOPY, WITH BRUSH BIOPSY;  Surgeon: Shelah Lamar RAMAN, MD;  Location: [facility];  Service: Pulmonary;;   BRONCHIAL NEEDLE ASPIRATION BIOPSY  07/09/2023   Procedure: BRONCHIAL NEEDLE ASPIRATION BIOPSIES;  Surgeon: Shelah Lamar RAMAN, MD;  Location: [facility];  Service: Pulmonary;;   BRONCHIAL NEEDLE ASPIRATION BIOPSY  04/07/2024   Procedure: BRONCHOSCOPY, WITH NEEDLE ASPIRATION BIOPSY;  Surgeon: Shelah Lamar RAMAN, MD;  Location: [facility] ENDOSCOPY;  Service:  Pulmonary;;   CATARACT EXTRACTION  11/27/2007   bilateral   COLONOSCOPY  11/26/2010   Dr. Vicci. Diverticulosis. BE also done--normal   ERCP N/A 07/08/2024   Procedure: ERCP, WITH INTERVENTION IF INDICATED;  Surgeon: Jinny Carmine, MD;  Location: [facility] ENDOSCOPY;  Service: Endoscopy;  Laterality: N/A;  STENT PLACEMENT   ESOPHAGOGASTRODUODENOSCOPY (EGD) WITH PROPOFOL  N/A 10/07/2023   Procedure: ESOPHAGOGASTRODUODENOSCOPY (EGD) WITH PROPOFOL ;  Surgeon: Wilhelmenia Aloha Raddle., MD;  Location: [facility];  Service: Gastroenterology;  Laterality: N/A;   EUS N/A 10/07/2023   Procedure: UPPER ENDOSCOPIC ULTRASOUND (EUS) RADIAL;  Surgeon: Wilhelmenia Aloha Raddle., MD;  Location: [facility];  Service: Gastroenterology;  Laterality: N/A;   FIDUCIAL MARKER PLACEMENT  07/09/2023   Procedure: FIDUCIAL MARKER PLACEMENT;  Surgeon: Shelah Lamar RAMAN, MD;  Location: [facility] ENDOSCOPY;  Service: Pulmonary;;   FINE NEEDLE ASPIRATION N/A 10/07/2023   Procedure: FINE NEEDLE ASPIRATION (FNA) LINEAR;  Surgeon: Wilhelmenia Aloha Raddle., MD;  Location: [facility];  Service: Gastroenterology;  Laterality: N/A;   IR BILIARY STENT(S) NEW ACCESS W/O DRAIN  07/09/2024   OMENTECTOMY  11/26/2005   and pelvic lymphadenectomy (with BSO)   PORTACATH PLACEMENT N/A 11/22/2023   Procedure: INSERTION PORT-A-CATH;  Surgeon: Aron Shoulders, MD;  Location: [facility];  Service: General;  Laterality: N/A;  ROBOTIC ASSISTED LAPAROSCOPIC LYSIS OF ADHESION N/A 07/18/2023   Procedure: ROBOTIC ASSISTED PELVIC MASS EXCISION WITH RADICAL DISSECTION, MINI LAPAROTOMY, CYSTOSCOPY;  Surgeon: Viktoria Comer SAUNDERS, MD;  Location: [facility];  Service: Gynecology;  Laterality: N/A;   SKIN CANCER EXCISION Left 07/2021   neck; pt can't recall type of cancer   TONSILLECTOMY  age 41   VAGINAL HYSTERECTOMY  late 20's   didn't want any more children   VIDEO BRONCHOSCOPY WITH ENDOBRONCHIAL NAVIGATION Bilateral 04/07/2024   Procedure: VIDEO BRONCHOSCOPY WITH  ENDOBRONCHIAL NAVIGATION;  Surgeon: Shelah Lamar RAMAN, MD;  Location: [facility];  Service: Pulmonary;  Laterality: Bilateral;  WITH FLUORO    I have reviewed the social history and family history with the patient and they are unchanged from previous note.  ALLERGIES:  has no known allergies.  MEDICATIONS:  Current Outpatient Medications  Medication Sig Dispense Refill   acetaminophen  (TYLENOL ) 500 MG tablet Take 2 tablets (1,000 mg total) by mouth every 8 (eight) hours as needed for mild pain (pain score 1-3).     acetaminophen -codeine  (TYLENOL  #3) 300-30 MG tablet Take 1 tablet by mouth every 8 (eight) hours as needed (for moderate pain (pain score 4 to 6)). 60 tablet 0   albuterol  (VENTOLIN  HFA) 108 (90 Base) MCG/ACT inhaler INHALE 2 PUFFS BY MOUTH EVERY 6 HOURS AS NEEDED FOR WHEEZING FOR SHORTNESS OF BREATH . APPOINTMENT REQUIRED FOR FUTURE REFILLS 9 g 8   cholecalciferol (VITAMIN D3) 25 MCG (1000 UNIT) tablet Take 1,000 Units by mouth daily.     Coenzyme Q10 (COQ10) 100 MG CAPS Take 100 mg by mouth at bedtime.     lidocaine -prilocaine  (EMLA ) cream Apply 1 Application topically as needed (for port access).     losartan  (COZAAR ) 50 MG tablet Take 1 tablet (50 mg total) by mouth daily. 90 tablet 3   magic mouthwash (nystatin , lidocaine , diphenhydrAMINE , alum & mag hydroxide) suspension Take 5 mLs by mouth 4 (four) times daily as needed for mouth pain. 400 mL 0   ondansetron  (ZOFRAN ) 8 MG tablet Take 1 tablet (8 mg total) by mouth every 8 (eight) hours as needed for nausea or vomiting. 30 tablet 1   rosuvastatin  (CRESTOR ) 10 MG tablet Take 1 tablet (10 mg total) by mouth at bedtime. 90 tablet 3   Tiotropium Bromide -Olodaterol (STIOLTO RESPIMAT ) 2.5-2.5 MCG/ACT AERS INHALE 2 PUFFS BY MOUTH ONCE DAILY 4 g 11   No current facility-administered medications for this visit.    PHYSICAL EXAMINATION: ECOG PERFORMANCE STATUS: 2 - Symptomatic, <50% confined to bed  Vitals:   09/10/24 1357  BP:  130/68  Pulse: 77  Resp: 18  Temp: 98.3 F (36.8 C)  SpO2: 96%   Wt Readings from Last 3 Encounters:  09/10/24 105 lb 11.2 oz (47.9 kg)  09/03/24 102 lb 3.2 oz (46.4 kg)  08/24/24 100 lb 6.4 oz (45.5 kg)     GENERAL:alert, no distress and comfortable SKIN: skin color, texture, turgor are normal, no rashes or significant lesions EYES: normal, Conjunctiva are pink and non-injected, sclera clear NECK: supple, thyroid  normal size, non-tender, without nodularity LYMPH:  no palpable lymphadenopathy in the cervical, axillary  LUNGS: clear to auscultation and percussion with normal breathing effort HEART: regular rate & rhythm and no murmurs and no lower extremity edema ABDOMEN:abdomen soft, non-tender and normal bowel sounds Musculoskeletal:no cyanosis of digits and no clubbing  NEURO: alert & oriented x 3 with fluent speech, no focal motor/sensory deficits  Physical Exam    LABORATORY DATA:  I have reviewed the data as listed    Latest Ref Rng & Units 09/10/2024    1:35 PM 09/03/2024    1:45 PM 07/30/2024   11:25 AM  CBC  WBC 4.0 - 10.5 K/uL 5.6  5.6  4.4   Hemoglobin 12.0 - 15.0 g/dL 87.8  87.4  89.0   Hematocrit 36.0 - 46.0 % 36.3  36.6  32.8   Platelets 150 - 400 K/uL 152  145  162         Latest Ref Rng & Units 09/10/2024    1:35 PM 09/03/2024    1:45 PM 07/30/2024   11:25 AM  CMP  Glucose 70 - 99 mg/dL 880  792  785   BUN 8 - 23 mg/dL 10  10  14    Creatinine 0.44 - 1.00 mg/dL 9.41  9.42  9.54   Sodium 135 - 145 mmol/L 136  135  136   Potassium 3.5 - 5.1 mmol/L 3.7  4.0  3.5   Chloride 98 - 111 mmol/L 102  101  101   CO2 22 - 32 mmol/L 31  31  31    Calcium  8.9 - 10.3 mg/dL 9.2  9.2  8.5   Total Protein 6.5 - 8.1 g/dL 6.7  7.1  6.7   Total Bilirubin 0.0 - 1.2 mg/dL 0.8  0.9  2.3   Alkaline Phos 38 - 126 U/L 115  123  194   AST 15 - 41 U/L 16  16  20    ALT 0 - 44 U/L 11  12  21        RADIOGRAPHIC STUDIES: I have personally reviewed the radiological images as  listed and agreed with the findings in the report. No results found.    No orders of the defined types were placed in this encounter.  All questions were answered. The patient knows to call the clinic with any problems, questions or concerns. No barriers to learning was detected. The total time spent in the appointment was 30 minutes, including review of chart and various tests results, discussions about plan of care and coordination of care plan     Onita Mattock, MD 09/10/2024

## 2024-09-11 ENCOUNTER — Ambulatory Visit
Admission: RE | Admit: 2024-09-11 | Discharge: 2024-09-11 | Disposition: A | Source: Ambulatory Visit | Attending: Radiation Oncology | Admitting: Radiation Oncology

## 2024-09-11 ENCOUNTER — Other Ambulatory Visit: Payer: Self-pay

## 2024-09-11 DIAGNOSIS — C3411 Malignant neoplasm of upper lobe, right bronchus or lung: Secondary | ICD-10-CM | POA: Diagnosis not present

## 2024-09-11 DIAGNOSIS — C250 Malignant neoplasm of head of pancreas: Secondary | ICD-10-CM | POA: Diagnosis not present

## 2024-09-11 DIAGNOSIS — Z510 Encounter for antineoplastic radiation therapy: Secondary | ICD-10-CM | POA: Diagnosis not present

## 2024-09-11 LAB — RAD ONC ARIA SESSION SUMMARY
Course Elapsed Days: 1
Plan Fractions Treated to Date: 2
Plan Prescribed Dose Per Fraction: 2.5 Gy
Plan Total Fractions Prescribed: 15
Plan Total Prescribed Dose: 37.5 Gy
Reference Point Dosage Given to Date: 5 Gy
Reference Point Session Dosage Given: 2.5 Gy
Session Number: 2

## 2024-09-14 ENCOUNTER — Other Ambulatory Visit: Payer: Self-pay

## 2024-09-14 ENCOUNTER — Ambulatory Visit
Admission: RE | Admit: 2024-09-14 | Discharge: 2024-09-14 | Disposition: A | Source: Ambulatory Visit | Attending: Radiation Oncology | Admitting: Radiation Oncology

## 2024-09-14 DIAGNOSIS — C250 Malignant neoplasm of head of pancreas: Secondary | ICD-10-CM | POA: Diagnosis not present

## 2024-09-14 DIAGNOSIS — Z510 Encounter for antineoplastic radiation therapy: Secondary | ICD-10-CM | POA: Diagnosis not present

## 2024-09-14 DIAGNOSIS — C3411 Malignant neoplasm of upper lobe, right bronchus or lung: Secondary | ICD-10-CM | POA: Diagnosis not present

## 2024-09-14 LAB — RAD ONC ARIA SESSION SUMMARY
Course Elapsed Days: 4
Plan Fractions Treated to Date: 3
Plan Prescribed Dose Per Fraction: 2.5 Gy
Plan Total Fractions Prescribed: 15
Plan Total Prescribed Dose: 37.5 Gy
Reference Point Dosage Given to Date: 7.5 Gy
Reference Point Session Dosage Given: 2.5 Gy
Session Number: 3

## 2024-09-15 ENCOUNTER — Ambulatory Visit
Admission: RE | Admit: 2024-09-15 | Discharge: 2024-09-15 | Disposition: A | Discharge status: Home / Self Care | Source: Ambulatory Visit | Attending: Radiation Oncology | Admitting: Radiation Oncology

## 2024-09-15 ENCOUNTER — Ambulatory Visit
Admission: RE | Admit: 2024-09-15 | Discharge: 2024-09-15 | Disposition: A | Source: Ambulatory Visit | Attending: Radiation Oncology | Admitting: Radiation Oncology

## 2024-09-15 ENCOUNTER — Other Ambulatory Visit: Payer: Self-pay

## 2024-09-15 DIAGNOSIS — C250 Malignant neoplasm of head of pancreas: Secondary | ICD-10-CM | POA: Diagnosis not present

## 2024-09-15 DIAGNOSIS — K8689 Other specified diseases of pancreas: Secondary | ICD-10-CM

## 2024-09-15 LAB — RAD ONC ARIA SESSION SUMMARY
Course Elapsed Days: 5
Plan Fractions Treated to Date: 4
Plan Prescribed Dose Per Fraction: 2.5 Gy
Plan Total Fractions Prescribed: 15
Plan Total Prescribed Dose: 37.5 Gy
Reference Point Dosage Given to Date: 10 Gy
Reference Point Session Dosage Given: 2.5 Gy
Session Number: 4

## 2024-09-15 MED ORDER — SONAFINE EX EMUL
1.0000 | Freq: Once | CUTANEOUS | Status: AC
  Administered 2024-09-15: 1 via TOPICAL

## 2024-09-16 ENCOUNTER — Other Ambulatory Visit: Payer: Self-pay

## 2024-09-16 ENCOUNTER — Ambulatory Visit
Admission: RE | Admit: 2024-09-16 | Discharge: 2024-09-16 | Disposition: A | Source: Ambulatory Visit | Attending: Radiation Oncology | Admitting: Radiation Oncology

## 2024-09-16 DIAGNOSIS — C250 Malignant neoplasm of head of pancreas: Secondary | ICD-10-CM | POA: Diagnosis not present

## 2024-09-16 DIAGNOSIS — C3411 Malignant neoplasm of upper lobe, right bronchus or lung: Secondary | ICD-10-CM | POA: Diagnosis not present

## 2024-09-16 DIAGNOSIS — Z510 Encounter for antineoplastic radiation therapy: Secondary | ICD-10-CM | POA: Diagnosis not present

## 2024-09-16 LAB — RAD ONC ARIA SESSION SUMMARY
Course Elapsed Days: 6
Plan Fractions Treated to Date: 5
Plan Prescribed Dose Per Fraction: 2.5 Gy
Plan Total Fractions Prescribed: 15
Plan Total Prescribed Dose: 37.5 Gy
Reference Point Dosage Given to Date: 12.5 Gy
Reference Point Session Dosage Given: 2.5 Gy
Session Number: 5

## 2024-09-17 ENCOUNTER — Other Ambulatory Visit: Payer: Self-pay

## 2024-09-17 ENCOUNTER — Ambulatory Visit
Admission: RE | Admit: 2024-09-17 | Discharge: 2024-09-17 | Disposition: A | Source: Ambulatory Visit | Attending: Radiation Oncology | Admitting: Radiation Oncology

## 2024-09-17 DIAGNOSIS — C250 Malignant neoplasm of head of pancreas: Secondary | ICD-10-CM | POA: Diagnosis not present

## 2024-09-17 LAB — RAD ONC ARIA SESSION SUMMARY
Course Elapsed Days: 7
Plan Fractions Treated to Date: 6
Plan Prescribed Dose Per Fraction: 2.5 Gy
Plan Total Fractions Prescribed: 15
Plan Total Prescribed Dose: 37.5 Gy
Reference Point Dosage Given to Date: 15 Gy
Reference Point Session Dosage Given: 2.5 Gy
Session Number: 6

## 2024-09-18 ENCOUNTER — Other Ambulatory Visit: Payer: Self-pay

## 2024-09-18 ENCOUNTER — Ambulatory Visit
Admission: RE | Admit: 2024-09-18 | Discharge: 2024-09-18 | Disposition: A | Discharge status: Home / Self Care | Source: Ambulatory Visit | Attending: Radiation Oncology | Admitting: Radiation Oncology

## 2024-09-18 DIAGNOSIS — Z510 Encounter for antineoplastic radiation therapy: Secondary | ICD-10-CM | POA: Diagnosis not present

## 2024-09-18 DIAGNOSIS — C3411 Malignant neoplasm of upper lobe, right bronchus or lung: Secondary | ICD-10-CM | POA: Diagnosis not present

## 2024-09-18 DIAGNOSIS — C250 Malignant neoplasm of head of pancreas: Secondary | ICD-10-CM | POA: Diagnosis not present

## 2024-09-18 LAB — RAD ONC ARIA SESSION SUMMARY
Course Elapsed Days: 8
Plan Fractions Treated to Date: 7
Plan Prescribed Dose Per Fraction: 2.5 Gy
Plan Total Fractions Prescribed: 15
Plan Total Prescribed Dose: 37.5 Gy
Reference Point Dosage Given to Date: 17.5 Gy
Reference Point Session Dosage Given: 2.5 Gy
Session Number: 7

## 2024-09-21 ENCOUNTER — Other Ambulatory Visit: Payer: Self-pay

## 2024-09-21 ENCOUNTER — Ambulatory Visit
Admission: RE | Admit: 2024-09-21 | Discharge: 2024-09-21 | Disposition: A | Discharge status: Home / Self Care | Source: Ambulatory Visit | Attending: Radiation Oncology | Admitting: Radiation Oncology

## 2024-09-21 DIAGNOSIS — Z510 Encounter for antineoplastic radiation therapy: Secondary | ICD-10-CM | POA: Diagnosis not present

## 2024-09-21 DIAGNOSIS — C3411 Malignant neoplasm of upper lobe, right bronchus or lung: Secondary | ICD-10-CM | POA: Diagnosis not present

## 2024-09-21 DIAGNOSIS — C250 Malignant neoplasm of head of pancreas: Secondary | ICD-10-CM | POA: Diagnosis not present

## 2024-09-21 LAB — RAD ONC ARIA SESSION SUMMARY
Course Elapsed Days: 11
Plan Fractions Treated to Date: 8
Plan Prescribed Dose Per Fraction: 2.5 Gy
Plan Total Fractions Prescribed: 15
Plan Total Prescribed Dose: 37.5 Gy
Reference Point Dosage Given to Date: 20 Gy
Reference Point Session Dosage Given: 2.5 Gy
Session Number: 8

## 2024-09-22 ENCOUNTER — Ambulatory Visit

## 2024-09-22 ENCOUNTER — Other Ambulatory Visit: Payer: Self-pay

## 2024-09-22 ENCOUNTER — Ambulatory Visit
Admission: RE | Admit: 2024-09-22 | Discharge: 2024-09-22 | Disposition: A | Source: Ambulatory Visit | Attending: Radiation Oncology | Admitting: Radiation Oncology

## 2024-09-22 DIAGNOSIS — C250 Malignant neoplasm of head of pancreas: Secondary | ICD-10-CM | POA: Diagnosis not present

## 2024-09-22 LAB — RAD ONC ARIA SESSION SUMMARY
Course Elapsed Days: 12
Plan Fractions Treated to Date: 9
Plan Prescribed Dose Per Fraction: 2.5 Gy
Plan Total Fractions Prescribed: 15
Plan Total Prescribed Dose: 37.5 Gy
Reference Point Dosage Given to Date: 22.5 Gy
Reference Point Session Dosage Given: 2.5 Gy
Session Number: 9

## 2024-09-23 ENCOUNTER — Ambulatory Visit

## 2024-09-23 ENCOUNTER — Ambulatory Visit
Admission: RE | Admit: 2024-09-23 | Discharge: 2024-09-23 | Disposition: A | Source: Ambulatory Visit | Attending: Radiation Oncology | Admitting: Radiation Oncology

## 2024-09-23 ENCOUNTER — Other Ambulatory Visit: Payer: Self-pay

## 2024-09-23 ENCOUNTER — Ambulatory Visit
Admission: RE | Admit: 2024-09-23 | Discharge: 2024-09-23 | Attending: Radiation Oncology | Admitting: Radiation Oncology

## 2024-09-23 DIAGNOSIS — C250 Malignant neoplasm of head of pancreas: Secondary | ICD-10-CM | POA: Diagnosis not present

## 2024-09-23 LAB — RAD ONC ARIA SESSION SUMMARY
Course Elapsed Days: 13
Plan Fractions Treated to Date: 10
Plan Prescribed Dose Per Fraction: 2.5 Gy
Plan Total Fractions Prescribed: 15
Plan Total Prescribed Dose: 37.5 Gy
Reference Point Dosage Given to Date: 25 Gy
Reference Point Session Dosage Given: 2.5 Gy
Session Number: 10

## 2024-09-24 ENCOUNTER — Ambulatory Visit
Admission: RE | Admit: 2024-09-24 | Discharge: 2024-09-24 | Disposition: A | Discharge status: Home / Self Care | Source: Ambulatory Visit | Attending: Radiation Oncology | Admitting: Radiation Oncology

## 2024-09-24 ENCOUNTER — Other Ambulatory Visit: Payer: Self-pay

## 2024-09-24 DIAGNOSIS — C250 Malignant neoplasm of head of pancreas: Secondary | ICD-10-CM | POA: Diagnosis not present

## 2024-09-24 LAB — RAD ONC ARIA SESSION SUMMARY
Course Elapsed Days: 14
Plan Fractions Treated to Date: 11
Plan Prescribed Dose Per Fraction: 2.5 Gy
Plan Total Fractions Prescribed: 15
Plan Total Prescribed Dose: 37.5 Gy
Reference Point Dosage Given to Date: 27.5 Gy
Reference Point Session Dosage Given: 2.5 Gy
Session Number: 11

## 2024-09-25 ENCOUNTER — Ambulatory Visit
Admission: RE | Admit: 2024-09-25 | Discharge: 2024-09-25 | Disposition: A | Discharge status: Home / Self Care | Source: Ambulatory Visit | Attending: Radiation Oncology | Admitting: Radiation Oncology

## 2024-09-25 ENCOUNTER — Other Ambulatory Visit: Payer: Self-pay

## 2024-09-25 DIAGNOSIS — C250 Malignant neoplasm of head of pancreas: Secondary | ICD-10-CM | POA: Diagnosis not present

## 2024-09-25 LAB — RAD ONC ARIA SESSION SUMMARY
Course Elapsed Days: 15
Plan Fractions Treated to Date: 12
Plan Prescribed Dose Per Fraction: 2.5 Gy
Plan Total Fractions Prescribed: 15
Plan Total Prescribed Dose: 37.5 Gy
Reference Point Dosage Given to Date: 30 Gy
Reference Point Session Dosage Given: 2.5 Gy
Session Number: 12

## 2024-09-28 ENCOUNTER — Other Ambulatory Visit: Payer: Self-pay

## 2024-09-28 ENCOUNTER — Ambulatory Visit
Admission: RE | Admit: 2024-09-28 | Discharge: 2024-09-28 | Disposition: A | Discharge status: Home / Self Care | Source: Ambulatory Visit | Attending: Radiation Oncology | Admitting: Radiation Oncology

## 2024-09-28 DIAGNOSIS — C250 Malignant neoplasm of head of pancreas: Secondary | ICD-10-CM | POA: Diagnosis not present

## 2024-09-28 DIAGNOSIS — C3411 Malignant neoplasm of upper lobe, right bronchus or lung: Secondary | ICD-10-CM | POA: Insufficient documentation

## 2024-09-28 DIAGNOSIS — Z510 Encounter for antineoplastic radiation therapy: Secondary | ICD-10-CM | POA: Diagnosis not present

## 2024-09-28 LAB — RAD ONC ARIA SESSION SUMMARY
Course Elapsed Days: 18
Plan Fractions Treated to Date: 13
Plan Prescribed Dose Per Fraction: 2.5 Gy
Plan Total Fractions Prescribed: 15
Plan Total Prescribed Dose: 37.5 Gy
Reference Point Dosage Given to Date: 32.5 Gy
Reference Point Session Dosage Given: 2.5 Gy
Session Number: 13

## 2024-09-29 ENCOUNTER — Other Ambulatory Visit: Payer: Self-pay

## 2024-09-29 ENCOUNTER — Ambulatory Visit
Admission: RE | Admit: 2024-09-29 | Discharge: 2024-09-29 | Disposition: A | Source: Ambulatory Visit | Attending: Radiation Oncology | Admitting: Radiation Oncology

## 2024-09-29 ENCOUNTER — Ambulatory Visit
Admission: RE | Admit: 2024-09-29 | Discharge: 2024-09-29 | Disposition: A | Discharge status: Home / Self Care | Source: Ambulatory Visit | Attending: Radiation Oncology | Admitting: Radiation Oncology

## 2024-09-29 DIAGNOSIS — C250 Malignant neoplasm of head of pancreas: Secondary | ICD-10-CM | POA: Diagnosis not present

## 2024-09-29 DIAGNOSIS — C3411 Malignant neoplasm of upper lobe, right bronchus or lung: Secondary | ICD-10-CM | POA: Diagnosis not present

## 2024-09-29 DIAGNOSIS — Z510 Encounter for antineoplastic radiation therapy: Secondary | ICD-10-CM | POA: Diagnosis not present

## 2024-09-29 LAB — RAD ONC ARIA SESSION SUMMARY
Course Elapsed Days: 19
Plan Fractions Treated to Date: 14
Plan Prescribed Dose Per Fraction: 2.5 Gy
Plan Total Fractions Prescribed: 15
Plan Total Prescribed Dose: 37.5 Gy
Reference Point Dosage Given to Date: 35 Gy
Reference Point Session Dosage Given: 2.5 Gy
Session Number: 14

## 2024-09-30 ENCOUNTER — Inpatient Hospital Stay (HOSPITAL_BASED_OUTPATIENT_CLINIC_OR_DEPARTMENT_OTHER): Admitting: Hematology

## 2024-09-30 ENCOUNTER — Ambulatory Visit
Admission: RE | Admit: 2024-09-30 | Discharge: 2024-09-30 | Disposition: A | Source: Ambulatory Visit | Attending: Radiation Oncology | Admitting: Radiation Oncology

## 2024-09-30 ENCOUNTER — Other Ambulatory Visit: Payer: Self-pay

## 2024-09-30 ENCOUNTER — Inpatient Hospital Stay: Attending: Gynecologic Oncology

## 2024-09-30 VITALS — BP 116/52 | HR 80 | Temp 98.2°F | Resp 17 | Wt 104.0 lb

## 2024-09-30 DIAGNOSIS — J439 Emphysema, unspecified: Secondary | ICD-10-CM | POA: Diagnosis not present

## 2024-09-30 DIAGNOSIS — Z79899 Other long term (current) drug therapy: Secondary | ICD-10-CM | POA: Insufficient documentation

## 2024-09-30 DIAGNOSIS — Z9849 Cataract extraction status, unspecified eye: Secondary | ICD-10-CM | POA: Insufficient documentation

## 2024-09-30 DIAGNOSIS — C3411 Malignant neoplasm of upper lobe, right bronchus or lung: Secondary | ICD-10-CM | POA: Insufficient documentation

## 2024-09-30 DIAGNOSIS — Z720 Tobacco use: Secondary | ICD-10-CM | POA: Diagnosis not present

## 2024-09-30 DIAGNOSIS — R630 Anorexia: Secondary | ICD-10-CM | POA: Diagnosis not present

## 2024-09-30 DIAGNOSIS — C250 Malignant neoplasm of head of pancreas: Secondary | ICD-10-CM | POA: Insufficient documentation

## 2024-09-30 DIAGNOSIS — M25472 Effusion, left ankle: Secondary | ICD-10-CM | POA: Insufficient documentation

## 2024-09-30 DIAGNOSIS — K862 Cyst of pancreas: Secondary | ICD-10-CM | POA: Diagnosis not present

## 2024-09-30 DIAGNOSIS — G893 Neoplasm related pain (acute) (chronic): Secondary | ICD-10-CM | POA: Insufficient documentation

## 2024-09-30 DIAGNOSIS — Z515 Encounter for palliative care: Secondary | ICD-10-CM | POA: Diagnosis not present

## 2024-09-30 DIAGNOSIS — Z923 Personal history of irradiation: Secondary | ICD-10-CM | POA: Diagnosis not present

## 2024-09-30 DIAGNOSIS — Z860100 Personal history of colon polyps, unspecified: Secondary | ICD-10-CM | POA: Insufficient documentation

## 2024-09-30 DIAGNOSIS — Z90722 Acquired absence of ovaries, bilateral: Secondary | ICD-10-CM | POA: Insufficient documentation

## 2024-09-30 DIAGNOSIS — Z8701 Personal history of pneumonia (recurrent): Secondary | ICD-10-CM | POA: Diagnosis not present

## 2024-09-30 DIAGNOSIS — R530 Neoplastic (malignant) related fatigue: Secondary | ICD-10-CM | POA: Insufficient documentation

## 2024-09-30 DIAGNOSIS — M1990 Unspecified osteoarthritis, unspecified site: Secondary | ICD-10-CM | POA: Diagnosis not present

## 2024-09-30 DIAGNOSIS — R600 Localized edema: Secondary | ICD-10-CM | POA: Insufficient documentation

## 2024-09-30 DIAGNOSIS — R112 Nausea with vomiting, unspecified: Secondary | ICD-10-CM | POA: Insufficient documentation

## 2024-09-30 DIAGNOSIS — Z510 Encounter for antineoplastic radiation therapy: Secondary | ICD-10-CM | POA: Diagnosis not present

## 2024-09-30 DIAGNOSIS — Z85828 Personal history of other malignant neoplasm of skin: Secondary | ICD-10-CM | POA: Diagnosis not present

## 2024-09-30 DIAGNOSIS — M25471 Effusion, right ankle: Secondary | ICD-10-CM | POA: Diagnosis not present

## 2024-09-30 DIAGNOSIS — Z90710 Acquired absence of both cervix and uterus: Secondary | ICD-10-CM | POA: Insufficient documentation

## 2024-09-30 DIAGNOSIS — M8580 Other specified disorders of bone density and structure, unspecified site: Secondary | ICD-10-CM | POA: Insufficient documentation

## 2024-09-30 DIAGNOSIS — Z803 Family history of malignant neoplasm of breast: Secondary | ICD-10-CM | POA: Insufficient documentation

## 2024-09-30 DIAGNOSIS — I10 Essential (primary) hypertension: Secondary | ICD-10-CM | POA: Insufficient documentation

## 2024-09-30 LAB — RAD ONC ARIA SESSION SUMMARY
Course Elapsed Days: 20
Plan Fractions Treated to Date: 15
Plan Prescribed Dose Per Fraction: 2.5 Gy
Plan Total Fractions Prescribed: 15
Plan Total Prescribed Dose: 37.5 Gy
Reference Point Dosage Given to Date: 37.5 Gy
Reference Point Session Dosage Given: 2.5 Gy
Session Number: 15

## 2024-09-30 LAB — CBC WITH DIFFERENTIAL (CANCER CENTER ONLY)
Abs Immature Granulocytes: 0.01 K/uL (ref 0.00–0.07)
Basophils Absolute: 0 K/uL (ref 0.0–0.1)
Basophils Relative: 0 %
Eosinophils Absolute: 0.1 K/uL (ref 0.0–0.5)
Eosinophils Relative: 3 %
HCT: 37.6 % (ref 36.0–46.0)
Hemoglobin: 12.9 g/dL (ref 12.0–15.0)
Immature Granulocytes: 0 %
Lymphocytes Relative: 5 %
Lymphs Abs: 0.3 K/uL — ABNORMAL LOW (ref 0.7–4.0)
MCH: 31.7 pg (ref 26.0–34.0)
MCHC: 34.3 g/dL (ref 30.0–36.0)
MCV: 92.4 fL (ref 80.0–100.0)
Monocytes Absolute: 0.5 K/uL (ref 0.1–1.0)
Monocytes Relative: 10 %
Neutro Abs: 3.8 K/uL (ref 1.7–7.7)
Neutrophils Relative %: 82 %
Platelet Count: 134 K/uL — ABNORMAL LOW (ref 150–400)
RBC: 4.07 MIL/uL (ref 3.87–5.11)
RDW: 12.3 % (ref 11.5–15.5)
WBC Count: 4.6 K/uL (ref 4.0–10.5)
nRBC: 0 % (ref 0.0–0.2)

## 2024-09-30 LAB — CMP (CANCER CENTER ONLY)
ALT: 9 U/L (ref 0–44)
AST: 14 U/L — ABNORMAL LOW (ref 15–41)
Albumin: 3.3 g/dL — ABNORMAL LOW (ref 3.5–5.0)
Alkaline Phosphatase: 110 U/L (ref 38–126)
Anion gap: 4 — ABNORMAL LOW (ref 5–15)
BUN: 8 mg/dL (ref 8–23)
CO2: 33 mmol/L — ABNORMAL HIGH (ref 22–32)
Calcium: 8.6 mg/dL — ABNORMAL LOW (ref 8.9–10.3)
Chloride: 99 mmol/L (ref 98–111)
Creatinine: 0.48 mg/dL (ref 0.44–1.00)
GFR, Estimated: >60 mL/min (ref >60)
Glucose, Bld: 175 mg/dL — ABNORMAL HIGH (ref 70–99)
Potassium: 3.6 mmol/L (ref 3.5–5.1)
Sodium: 136 mmol/L (ref 135–145)
Total Bilirubin: 0.6 mg/dL (ref 0.0–1.2)
Total Protein: 6.7 g/dL (ref 6.5–8.1)

## 2024-09-30 NOTE — Assessment & Plan Note (Signed)
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  -PET scan from June 10, 2023 showed a hypermetabolic 3.1 cm mass in the right upper lobe, and hypermetabolic soft tissue fullness in the pancreatic head, and partially solid and cystic 12.4 cm pelvic mass. 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 Viktoria  -baseline CA19.9 33 -Pt has seen by pancreatobiliary surgeon Dr. Aron, who recommends neoadjuvant chemotherapy due to the uncertainty of lung metastasis.  Patient declined surgery. -Repeated CT on November 01, 2023 showed improved treated lung mass, but several new or enlarging lung nodules, concerning for metastatic disease.  -Patient is agreeable with chemo, she started gemcitabine  and Abraxane  on 12/04/2023 -CT 03/05/24 showed slightly decreased pancreatic mass, and a radiation changes to her lung lesion.  However she has multiple other new pulmonary nodules on the CT, which are mildly hypermetabolic on PET scan in June 2025 -pt was seen by her radiation oncologist Dr. Shannon about her lung cancer and pancreatic cancer consolidation radiation on 05/18/2024 and decided observation only  Pt developed jaundice, ERCP attempted on 07/08/2024 and she underwent percutaneous biliary drain placement -CT in 08/2024 showed disease progression. I recommend hospice after she completes radiation

## 2024-09-30 NOTE — Progress Notes (Signed)
 [facility]   [phone] [phone]) [phone]   Clinic Follow up Note   Patient Care Team: [name], MD as PCP - General (Family Medicine) Kassie Acquanetta Bradley, MD as Consulting Physician (Pulmonary Disease) Cleotilde Carlin Lenis, OD as Referring Physician (Optometry) Anderson, Dena D, PA-C (Physician Assistant) Lanny Callander, MD as Consulting Physician (Hematology and Oncology) Hanford Powell BRAVO, NP as Nurse Practitioner (Hematology and Oncology)  Date of Service:  09/30/2024  CHIEF COMPLAINT: f/u of metastatic pancreatic cancer  CURRENT THERAPY:  Palliative radiation  Oncology History   Pancreatic cancer (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  -PET scan from June 10, 2023 showed a hypermetabolic 3.1 cm mass in the right upper lobe, and hypermetabolic soft tissue fullness in the pancreatic head, and partially solid and cystic 12.4 cm pelvic mass. 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 Viktoria  -baseline CA19.9 33 -Pt has seen by pancreatobiliary surgeon Dr. Aron, who recommends neoadjuvant chemotherapy due to the uncertainty of lung metastasis.  Patient declined surgery. -Repeated CT on November 01, 2023 showed improved treated lung mass, but several new or enlarging lung nodules, concerning for metastatic disease.  -Patient is agreeable with chemo, she started gemcitabine  and Abraxane  on 12/04/2023 -CT 03/05/24 showed slightly decreased pancreatic mass, and a radiation changes to her lung lesion.  However she has multiple other new pulmonary nodules on the CT, which are mildly hypermetabolic on PET scan in June 2025 -pt was seen by her radiation oncologist Dr. Shannon about her lung cancer and pancreatic cancer consolidation radiation on  05/18/2024 and decided observation only  Pt developed jaundice, ERCP attempted on 07/08/2024 and she underwent percutaneous biliary drain placement -CT in 08/2024 showed disease progression. I recommend hospice after she completes radiation   Assessment & Plan Malignant neoplasm of head of pancreas Completed radiation therapy today. Chemotherapy not recommended due to overall weakness. CT scan not strongly indicated as it is unlikely to change management. Home care service discussed for symptom management and quality of life, with insurance coverage. - Will consider home care service for symptom management and quality of life - Will schedule phone visit in six weeks to discuss follow-up and potential CT scan  Cancer-related pain Pain managed with Tylenol  #3, taken as needed, primarily in the morning after breakfast. Pain does not last the entire day. - Continue Tylenol  #3 as needed for pain management  Cancer-related fatigue Significant fatigue reported, likely related to recent radiation therapy. Fatigue impacts daily activities but she is managing at home with assistance from her husband. - Continue to monitor fatigue levels and adjust activities as needed  Intermittent nausea and vomiting Nausea and vomiting present, with recent episode of vomiting over the weekend. Nausea medication is effective in managing symptoms. - Continue current nausea medication as needed  Cancer-related anorexia and weight loss Appetite is reduced, with weight stable but on the low side. Liquid nutrition supplements like Ensure are being used. - Encouraged continued use of liquid nutrition supplements like Ensure  Bilateral lower extremity edema likely secondary to hypoalbuminemia Bilateral ankle swelling likely due to hypoalbuminemia causing fluid leakage. Edema management discussed. - Elevate feet at night with a pillow under legs - Use compression stockings to reduce swelling  Chronic supplemental oxygen   use Uses oxygen  intermittently, primarily  during activities. No significant issues reported with oxygen  use. - Continue current oxygen  use as needed  Plan - She is scheduled to complete her radiation later this week, overall tolerating moderate well. - Continue supportive care.  I again discussed home hospice, she is not ready.  He declined home care service. - Phone visit in 6 weeks.   SUMMARY OF ONCOLOGIC HISTORY: Oncology History  Adenocarcinoma of upper lobe of right lung (HCC)  08/06/2023 Initial Diagnosis   Adenocarcinoma of upper lobe of right lung (HCC)   08/06/2023 Cancer Staging   Staging form: Lung, AJCC 8th Edition - Clinical: Stage IB (cT2a, cN0, cM0) - Signed by Sherrod Sherrod, MD on 08/06/2023   10/14/2023 Genetic Testing   Negative genetic testing on the CancerNext-Expanded+RNAinsight.  The report date is 10/13/2023.  The CancerNext-Expanded gene panel offered by [employer] and includes sequencing and rearrangement analysis for the following 71 genes: AIP, ALK, APC, ATM, BAP1, BARD1, BMPR1A, BRCA1, BRCA2, BRIP1, CDC73, CDH1, CDK4, CDKN1B, CDKN2A, CHEK2, DICER1, FH, FLCN, KIF1B, LZTR1, MAX, MEN1, MET, MLH1, MSH2, MSH6, MUTYH, NF1, NF2, NTHL1, PALB2, PHOX2B, PMS2, POT1, PRKAR1A, PTCH1, PTEN, RAD51C, RAD51D, RB1, RET, SDHA, SDHAF2, SDHB, SDHC, SDHD, SMAD4, SMARCA4, SMARCB1, SMARCE1, STK11, SUFU, TMEM127, TP53, TSC1, TSC2 and VHL (sequencing and deletion/duplication); AXIN2, CTNNA1, EGFR, EGLN1, HOXB13, KIT, MITF, MSH3, PDGFRA, POLD1 and POLE (sequencing only); EPCAM and GREM1 (deletion/duplication only). RNA data is routinely analyzed for use in variant interpretation for all genes.    Pancreatic cancer (HCC)  10/07/2023 Cancer Staging   Staging form: Exocrine Pancreas, AJCC 8th Edition - Clinical stage from 10/07/2023: Stage IB (cT2, cN0, cM0) - Signed by Lanny Callander, MD on 10/25/2023 Total positive nodes: 0   10/25/2023 Initial Diagnosis   Pancreatic cancer (HCC)    12/04/2023 -  Chemotherapy   [name] : PANCREATIC Abraxane  D1,8,15 + Gemcitabine  D1,8,15 q28d     03/05/2024 Imaging   CT CAP with contrast IMPRESSION: 1. Slightly diminished size of a hypodense mass of the pancreatic head, consistent with treatment response. No significant change in diffuse pancreatic ductal dilatation and atrophy of the remaining pancreatic parenchyma. 2. Subsolid lung lesion of the peripheral right upper lobe near a fiducial marker is essentially indistinguishable from background on today's examination with new subpleural consolidation consistent with developing radiation fibrosis. Findings consistent with treatment response of the lesion to local radiation therapy. 3. Multiple additional subsolid and predominantly solid nodular opacities, predominantly in the periphery of the right lung, several of which are increased in size and solid character, appearance and behavior most consistent with worsened multifocal pulmonary adenocarcinoma although pulmonary metastases not generally excluded in the setting of pancreatic adenocarcinoma. 4. Interval enlargement of a cystic lesion with some peripheral soft tissue in the left lower quadrant, of uncertain nature. This is possibly an unusual cystic metastasis or possibly of ovarian origin if there has been ovarian transposition. 5. Severe emphysema.        Discussed the use of AI scribe software for clinical note transcription with the patient, who gave verbal consent to proceed.  History of Present Illness Tiffany [name] is a 78 year old female with gastric cancer who presents for follow-up after completing radiation therapy.  She completed her last session of radiation therapy today. She experiences stomach discomfort, which is managed with prescribed medication. Over the past weekend, she had significant nausea and vomiting, although her nausea medication provides relief. Her bowel movements remain  regular. Her appetite is described  as 'okay,' but she did not eat much the previous day, having only breakfast and a barbecue sandwich. Her weight is stable but on the low side, and she is using Ensure for nutritional support.  She takes Tylenol  #3 for pain, usually one tablet every morning after breakfast, and confirms she has enough medication left. The medication does not last the whole day, but she takes it as needed.  She experiences some phlegm in her throat but no significant cough. She uses oxygen  intermittently, primarily when active, and manages without it when sitting. She reports swelling in both ankles.     All other systems were reviewed with the patient and are negative.  MEDICAL HISTORY:  Past Medical History:  Diagnosis Date   Abnormal PFT 09/02/2014   moderate restrictive ventilatory defect.   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 Ivin   Colon polyp 12/2003   Diverticulosis 11/2010   seen on colonoscopy and air contrast BE   Emphysema of lung (HCC)    Family history of breast cancer    GERD (gastroesophageal reflux disease)    Heart valve disease    History of radiation therapy    Right Lung-09/04/23-09/09/23- Dr. Lynwood Nasuti   Hyperlipidemia    Hypertension    previously treated with meds, resolved   MGUS (monoclonal gammopathy of unknown significance) 1999   prevously under care of Dr. 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   Tobacco abuse    quit in 2019    SURGICAL HISTORY: Past Surgical History:   Procedure Laterality Date   BASAL CELL CARCINOMA EXCISION  11/26/2008   L face, R temple   BILATERAL SALPINGOOPHORECTOMY  11/26/2005   cancerous tumor R ovary   BIOPSY  10/07/2023   Procedure: BIOPSY;  Surgeon: Wilhelmenia Aloha Raddle., MD;  Location: [facility];  Service: Gastroenterology;;   BRONCHIAL BIOPSY  07/09/2023   Procedure: BRONCHIAL BIOPSIES;  Surgeon: Shelah Lamar RAMAN, MD;  Location[facility] END[facility];  Service: Pulmonary;;   BRONCHIAL BIOPSY  04/07/2024   Procedure: BRONCHOSCOPY, WITH BIOPSY;  Surgeon: Shelah Lamar RAMAN, MD;  Location: [facility];  Service: Pulmonary;;   BRONCHIAL BRUSHINGS  07/09/2023   Procedure: BRONCHIAL BRUSHINGS;  Surgeon: Shelah Lamar RAMAN, MD;  Location: [facility] ENDOSCOPY;  Service: Pulmonary;;   BRONCHIAL BRUSHINGS  04/07/2024   Procedure: BRONCHOSCOPY, WITH BRUSH BIOPSY;  Surgeon: Shelah Lamar RAMAN, MD;  Location: [facility];  Service: Pulmonary;;   BRONCHIAL NEEDLE ASPIRATION BIOPSY  07/09/2023   Procedure: BRONCHIAL NEEDLE ASPIRATION BIOPSIES;  Surgeon: Shelah Lamar RAMAN, MD;  Location: [facility];  Service: Pulmonary;;   BRONCHIAL NEEDLE ASPIRATION BIOPSY  04/07/2024   Procedure: BRONCHOSCOPY, WITH NEEDLE ASPIRATION BIOPSY;  Surgeon: Shelah Lamar RAMAN, MD;  Location: [facility] ENDOSCOPY;  Service: Pulmonary;;   CATARACT EXTRACTION  11/27/2007   bilateral   COLONOSCOPY  11/26/2010   Dr. Vicci. Diverticulosis. BE also done--normal   ERCP N/A 07/08/2024   Procedure: ERCP, WITH INTERVENTION IF INDICATED;  Surgeon:  Jinny Carmine, MD;  Location: [facility] ENDOSCOPY;  Service: Endoscopy;  Laterality: N/A;  STENT PLACEMENT   ESOPHAGOGASTRODUODENOSCOPY (EGD) WITH PROPOFOL  N/A 10/07/2023   Procedure: ESOPHAGOGASTRODUODENOSCOPY (EGD) WITH PROPOFOL ;  Surgeon: Wilhelmenia Aloha Raddle., MD;  Location: [facility];  Service: Gastroenterology;  Laterality: N/A;   EUS N/A 10/07/2023   Procedure: UPPER ENDOSCOPIC ULTRASOUND (EUS) RADIAL;  Surgeon: Wilhelmenia Aloha Raddle., MD;  Location: [facility]  ENDOSCOPY;  Service: Gastroenterology;  Laterality: N/A;   FIDUCIAL MARKER PLACEMENT  07/09/2023   Procedure: FIDUCIAL MARKER PLACEMENT;  Surgeon: Shelah Lamar RAMAN, MD;  Location[facility]COPY;  Service: Pulmonary;;   FINE NEEDLE ASPIRATION N/A 10/07/2023   Procedure: FINE NEEDLE ASPIRATION (FNA) LINEAR;  Surgeon: Wilhelmenia Aloha Raddle., MD;  Location: [facility];  Service: Gastroenterology;  Laterality: N/A;   IR BILIARY STENT(S) NEW ACCESS W/O DRAIN  07/09/2024   OMENTECTOMY  11/26/2005   and pelvic lymphadenectomy (with BSO)   PORTACATH PLACEMENT N/A 11/22/2023   Procedure: INSERTION PORT-A-CATH;  Surgeon: Aron Shoulders, MD;  Location: [facility];  Service: General;  Laterality: N/A;   ROBOTIC ASSISTED LAPAROSCOPIC LYSIS OF ADHESION N/A 07/18/2023   Procedure: ROBOTIC ASSISTED PELVIC MASS EXCISION WITH RADICAL DISSECTION, MINI LAPAROTOMY, CYSTOSCOPY;  Surgeon: Viktoria Comer SAUNDERS, MD;  Location: [facility]S;  Service: Gynecology;  Laterality: N/A;   SKIN CANCER EXCISION Left 07/2021   neck; pt can't recall type of cancer   TONSILLECTOMY  age 16   VAGINAL HYSTERECTOMY  late 20's   didn't want any more children   VIDEO BRONCHOSCOPY WITH ENDOBRONCHIAL NAVIGATION Bilateral 04/07/2024   Procedure: VIDEO BRONCHOSCOPY WITH ENDOBRONCHIAL NAVIGATION;  Surgeon: Shelah Lamar RAMAN, MD;  Location: [facility];  Service: Pulmonary;  Laterality: Bilateral;  WITH FLUORO    I have reviewed the social history and family history with the patient and they are unchanged from previous note.  ALLERGIES:  has no known allergies.  MEDICATIONS:  Current Outpatient Medications  Medication Sig Dispense Refill   acetaminophen  (TYLENOL ) 500 MG tablet Take 2 tablets (1,000 mg total) by mouth every 8 (eight) hours as needed for mild pain (pain score 1-3).     acetaminophen -codeine  (TYLENOL  #3) 300-30 MG tablet Take 1 tablet by mouth every 8 (eight) hours as needed (for moderate pain (pain score 4 to 6)). 60 tablet 0    albuterol  (VENTOLIN  HFA) 108 (90 Base) MCG/ACT inhaler INHALE 2 PUFFS BY MOUTH EVERY 6 HOURS AS NEEDED FOR WHEEZING FOR SHORTNESS OF BREATH . APPOINTMENT REQUIRED FOR FUTURE REFILLS 9 g 8   cholecalciferol (VITAMIN D3) 25 MCG (1000 UNIT) tablet Take 1,000 Units by mouth daily.     Coenzyme Q10 (COQ10) 100 MG CAPS Take 100 mg by mouth at bedtime.     lidocaine -prilocaine  (EMLA ) cream Apply 1 Application topically as needed (for port access).     losartan  (COZAAR ) 50 MG tablet Take 1 tablet (50 mg total) by mouth daily. 90 tablet 3   magic mouthwash (nystatin , lidocaine , diphenhydrAMINE , alum & mag hydroxide) suspension Take 5 mLs by mouth 4 (four) times daily as needed for mouth pain. 400 mL 0   ondansetron  (ZOFRAN ) 8 MG tablet Take 1 tablet (8 mg total) by mouth every 8 (eight) hours as needed for nausea or vomiting. 30 tablet 1   rosuvastatin  (CRESTOR ) 10 MG tablet Take 1 tablet (10 mg total) by mouth at bedtime. 90 tablet 3   Tiotropium Bromide -Olodaterol (STIOLTO RESPIMAT ) 2.5-2.5 MCG/ACT AERS INHALE 2 PUFFS BY MOUTH ONCE DAILY 4 g 11   No current  facility-administered medications for this visit.    PHYSICAL EXAMINATION: ECOG PERFORMANCE STATUS: 2 - Symptomatic, <50% confined to bed  Vitals:   09/30/24 1350  BP: (!) 116/52  Pulse: 80  Resp: 17  Temp: 98.2 F (36.8 C)  SpO2: 93%   Wt Readings from Last 3 Encounters:  09/30/24 104 lb (47.2 kg)  09/10/24 105 lb 11.2 oz (47.9 kg)  09/03/24 102 lb 3.2 oz (46.4 kg)     GENERAL:alert, no distress and comfortable SKIN: skin color, texture, turgor are normal, no rashes or significant lesions EYES: normal, Conjunctiva are pink and non-injected, sclera clear NECK: supple, thyroid  normal size, non-tender, without nodularity LYMPH:  no palpable lymphadenopathy in the cervical, axillary  LUNGS: clear to auscultation and percussion with normal breathing effort HEART: regular rate & rhythm and no murmurs and no lower extremity  edema ABDOMEN:abdomen soft, non-tender and normal bowel sounds Musculoskeletal:no cyanosis of digits and no clubbing  NEURO: alert & oriented x 3 with fluent speech, no focal motor/sensory deficits  Physical Exam MEASUREMENTS: Weight- on the low side. ABDOMEN: Mild abdominal tenderness. EXTREMITIES: Bilateral ankle swelling.  LABORATORY DATA:  I have reviewed the data as listed    Latest Ref Rng & Units 09/30/2024    1:23 PM 09/10/2024    1:35 PM 09/03/2024    1:45 PM  CBC  WBC 4.0 - 10.5 K/uL 4.6  5.6  5.6   Hemoglobin 12.0 - 15.0 g/dL 87.0  87.8  87.4   Hematocrit 36.0 - 46.0 % 37.6  36.3  36.6   Platelets 150 - 400 K/uL 134  152  145         Latest Ref Rng & Units 09/30/2024    1:23 PM 09/10/2024    1:35 PM 09/03/2024    1:45 PM  CMP  Glucose 70 - 99 mg/dL 824  880  792   BUN 8 - 23 mg/dL 8  10  10    Creatinine 0.44 - 1.00 mg/dL 9.51  9.41  9.42   Sodium 135 - 145 mmol/L 136  136  135   Potassium 3.5 - 5.1 mmol/L 3.6  3.7  4.0   Chloride 98 - 111 mmol/L 99  102  101   CO2 22 - 32 mmol/L 33  31  31   Calcium  8.9 - 10.3 mg/dL 8.6  9.2  9.2   Total Protein 6.5 - 8.1 g/dL 6.7  6.7  7.1   Total Bilirubin 0.0 - 1.2 mg/dL 0.6  0.8  0.9   Alkaline Phos 38 - 126 U/L 110  115  123   AST 15 - 41 U/L 14  16  16    ALT 0 - 44 U/L 9  11  12        RADIOGRAPHIC STUDIES: I have personally reviewed the radiological images as listed and agreed with the findings in the report. No results found.    No orders of the defined types were placed in this encounter.  All questions were answered. The patient knows to call the clinic with any problems, questions or concerns. No barriers to learning was detected. The total time spent in the appointment was 25 minutes, including review of chart and various tests results, discussions about plan of care and coordination of care plan     Onita Mattock, MD 09/30/2024

## 2024-10-05 NOTE — Radiation Completion Notes (Addendum)
  Radiation Oncology         [phone]) [phone] ________________________________  Name: Tiffany [name] MRN: [id number]  Date of Service: 09/30/2024  DOB: 1945-12-22  End of Treatment Note   Diagnosis: Stage IB vs IV (cT2N0Mx) pancreatic cancer with lung nodules Intent: Palliative     ==========DELIVERED PLANS==========  First Treatment Date: 2024-09-10 Last Treatment Date: 2024-09-30   Plan Name: Pancreas Site: Pancreas Technique: 3D Mode: Photon Dose Per Fraction: 2.5 Gy Prescribed Dose (Delivered / Prescribed): 37.5 Gy / 37.5 Gy Prescribed Fxs (Delivered / Prescribed): 15 / 15     ====================================   The patient tolerated radiation. She experienced abdominal pain and vomiting for which she took Zofran  throughout her treatment.   The patient will return in one month and will continue follow up with Dr. Lanny as well.      Ronita Due, PA-C

## 2024-10-08 ENCOUNTER — Other Ambulatory Visit: Payer: Self-pay | Admitting: Pulmonary Disease

## 2024-10-27 ENCOUNTER — Other Ambulatory Visit: Payer: Self-pay | Admitting: Hematology

## 2024-10-28 ENCOUNTER — Encounter: Payer: Self-pay | Admitting: Radiation Oncology

## 2024-10-28 ENCOUNTER — Encounter: Payer: Self-pay | Admitting: Hematology

## 2024-10-28 NOTE — Progress Notes (Signed)
 Tiffany [name] is here today for follow up post radiation to the abdomen (pancreas).   They completed their radiation on: 09/30/24   Does the patient complain of any of the following:  Pain: No Abdominal bloating: None Diarrhea/Constipation: Regular. Nausea/Vomiting: None in the last 2 weeks. Urinary Issues (dysuria/incomplete emptying/ incontinence/ increased frequency/urgency): None Post radiation skin changes: None She reports having to wear her oxygen  more often since completion of radiation, wearing 2 liters.   Additional comments if applicable:

## 2024-11-01 NOTE — Progress Notes (Signed)
 Radiation Oncology         [phone] ________________________________  Name: Tiffany [name] MRN: [id number]  Date: 11/02/2024  DOB: October 30, 1946  Follow-Up Visit Note  CC: [name], MD  [name], MD  No diagnosis found.  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, s/p resection   Interval Since Last Radiation: 1 month and 3 days   2)  Intent: Palliative  Radiation Treatment Dates: First Treatment Date: 2024-09-10 -- Last Treatment Date: 2024-09-30 Site/Dose/Technique/Mode:  Plan Name: Pancreas Site: Pancreas Technique: 3D Mode: Photon Dose Per Fraction: 2.5 Gy Prescribed Dose (Delivered / Prescribed): 37.5 Gy / 37.5 Gy Prescribed Fxs (Delivered / Prescribed): 15 / 15  1)  Radiation Treatment Dates: First Treatment Date: 2023-09-04 - Last Treatment Date: 2023-09-09 Site/Dose/Technique/Mode:  Plan Name: Lung_R_SBRT Site: Lung, Right Technique: SBRT/SRT-IMRT Mode: Photon Dose Per Fraction: 18 Gy Prescribed Dose (Delivered / Prescribed): 54 Gy / 54 Gy Prescribed Fxs (Delivered / Prescribed): 3 / 3  Narrative:  The patient returns today for routine follow-up 1 month after completing radiation therapy to the pancreas. She tolerated radiation relatively well overall other then abdominal pain and vomiting which was managed with Zofran  throughout her treatment course.      On the date of her first radiation treatment (09/10/24) she also followed up with Dr. Lanny. In the setting of her  advanced disease, chemotherapy is not recommended. Dr. Lanny subsequently discussed the role of hospice care after completing radiation therapy at that time with the patient and her husband. With regards to her cancer related pain, she was instructed to continue using tylenol  #3 for pain management.        During her most recent visit with Dr. Lanny on 09/30/24, the patient reported having significant fatigue, intermittent nausea and vomiting, progressive weight loss secondary to reduced appetite, and bilateral LE edema. Despite her significant deconditioning, the patient and her husband expressed that they are not yet ready to consider hospice care. (Her husband is providing her assistance at home).  ***                        Allergies:  has no known allergies.  Meds: Current Outpatient Medications  Medication Sig Dispense Refill   acetaminophen  (TYLENOL ) 500 MG tablet Take 2 tablets (1,000 mg total) by mouth every 8 (eight) hours as needed for mild pain (pain score 1-3).     acetaminophen -codeine  (TYLENOL  #3) 300-30 MG tablet Take 1 tablet by mouth every 8 (eight) hours as needed (for moderate pain (pain score 4 to 6)). 60 tablet 0   albuterol  (VENTOLIN  HFA) 108 (90 Base) MCG/ACT inhaler INHALE 2 PUFFS BY MOUTH EVERY 6 HOURS AS NEEDED FOR WHEEZING FOR SHORTNESS OF BREATH . APPOINTMENT REQUIRED FOR FUTURE REFILLS 9 g 8   cholecalciferol (VITAMIN D3) 25 MCG (1000 UNIT) tablet Take 1,000 Units by mouth daily.     Coenzyme Q10 (COQ10) 100 MG CAPS Take 100 mg by mouth at bedtime.     lidocaine -prilocaine  (EMLA )  cream Apply 1 Application topically as needed (for port access).     losartan  (COZAAR ) 50 MG tablet Take 1 tablet (50 mg total) by mouth daily. 90 tablet 3   magic mouthwash (nystatin , lidocaine , diphenhydrAMINE , alum & mag hydroxide) suspension Take 5 mLs by mouth 4 (four) times daily as needed for mouth pain. 400 mL 0   ondansetron  (ZOFRAN ) 8 MG tablet Take 1 tablet (8 mg total) by mouth  every 8 (eight) hours as needed for nausea or vomiting. 30 tablet 1   rosuvastatin  (CRESTOR ) 10 MG tablet Take 1 tablet (10 mg total) by mouth at bedtime. 90 tablet 3   STIOLTO RESPIMAT  2.5-2.5 MCG/ACT AERS INHALE 2 PUFFS BY MOUTH ONCE DAILY 4 g 0   No current facility-administered medications for this encounter.    Physical Findings: The patient is in no acute distress. Patient is alert and oriented.  vitals were not taken for this visit. .  No significant changes. Lungs are clear to auscultation bilaterally. Heart has regular rate and rhythm. No palpable cervical, supraclavicular, or axillary adenopathy. Abdomen soft, non-tender, normal bowel sounds.   Lab Findings: Lab Results  Component Value Date   WBC 4.6 09/30/2024   HGB 12.9 09/30/2024   HCT 37.6 09/30/2024   MCV 92.4 09/30/2024   PLT 134 (L) 09/30/2024    Radiographic Findings: No results found.  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  ***  Plan:  ***   *** minutes of total time was spent for this patient encounter, including preparation, face-to-face counseling with the patient and coordination of care, physical exam, and documentation of the encounter. ____________________________________  Lynwood CHARM Nasuti, PhD, MD  This document serves as a record of services personally performed by Lynwood Nasuti, MD. It was created on his behalf by Dorthy Fuse, a trained medical scribe. The creation of this record is based on the scribe's personal observations and the provider's statements to them. This document has been checked and approved by the attending provider.

## 2024-11-02 ENCOUNTER — Ambulatory Visit
Admission: RE | Admit: 2024-11-02 | Discharge: 2024-11-02 | Disposition: A | Source: Ambulatory Visit | Attending: Radiation Oncology | Admitting: Radiation Oncology

## 2024-11-02 VITALS — BP 119/76 | HR 86 | Temp 97.5°F | Resp 18 | Ht 65.0 in | Wt 100.2 lb

## 2024-11-02 DIAGNOSIS — C250 Malignant neoplasm of head of pancreas: Secondary | ICD-10-CM | POA: Diagnosis not present

## 2024-11-02 DIAGNOSIS — R918 Other nonspecific abnormal finding of lung field: Secondary | ICD-10-CM | POA: Diagnosis not present

## 2024-11-02 DIAGNOSIS — C3411 Malignant neoplasm of upper lobe, right bronchus or lung: Secondary | ICD-10-CM | POA: Diagnosis not present

## 2024-11-02 DIAGNOSIS — R1013 Epigastric pain: Secondary | ICD-10-CM | POA: Diagnosis not present

## 2024-11-02 DIAGNOSIS — Z79899 Other long term (current) drug therapy: Secondary | ICD-10-CM | POA: Diagnosis not present

## 2024-11-02 DIAGNOSIS — Z923 Personal history of irradiation: Secondary | ICD-10-CM | POA: Diagnosis not present

## 2024-11-11 ENCOUNTER — Other Ambulatory Visit: Payer: Self-pay | Admitting: Pulmonary Disease

## 2024-11-12 ENCOUNTER — Inpatient Hospital Stay: Admitting: Hematology

## 2024-11-12 ENCOUNTER — Inpatient Hospital Stay: Attending: Gynecologic Oncology | Admitting: Hematology

## 2024-11-12 DIAGNOSIS — Z90722 Acquired absence of ovaries, bilateral: Secondary | ICD-10-CM | POA: Insufficient documentation

## 2024-11-12 DIAGNOSIS — Z9849 Cataract extraction status, unspecified eye: Secondary | ICD-10-CM | POA: Diagnosis not present

## 2024-11-12 DIAGNOSIS — M8580 Other specified disorders of bone density and structure, unspecified site: Secondary | ICD-10-CM | POA: Diagnosis not present

## 2024-11-12 DIAGNOSIS — Z803 Family history of malignant neoplasm of breast: Secondary | ICD-10-CM | POA: Diagnosis not present

## 2024-11-12 DIAGNOSIS — Z923 Personal history of irradiation: Secondary | ICD-10-CM | POA: Insufficient documentation

## 2024-11-12 DIAGNOSIS — K862 Cyst of pancreas: Secondary | ICD-10-CM | POA: Insufficient documentation

## 2024-11-12 DIAGNOSIS — Z79899 Other long term (current) drug therapy: Secondary | ICD-10-CM | POA: Insufficient documentation

## 2024-11-12 DIAGNOSIS — Z90710 Acquired absence of both cervix and uterus: Secondary | ICD-10-CM | POA: Insufficient documentation

## 2024-11-12 DIAGNOSIS — Z9981 Dependence on supplemental oxygen: Secondary | ICD-10-CM | POA: Diagnosis not present

## 2024-11-12 DIAGNOSIS — Z85828 Personal history of other malignant neoplasm of skin: Secondary | ICD-10-CM | POA: Insufficient documentation

## 2024-11-12 DIAGNOSIS — I10 Essential (primary) hypertension: Secondary | ICD-10-CM | POA: Diagnosis not present

## 2024-11-12 DIAGNOSIS — Z860100 Personal history of colon polyps, unspecified: Secondary | ICD-10-CM | POA: Diagnosis not present

## 2024-11-12 DIAGNOSIS — C250 Malignant neoplasm of head of pancreas: Secondary | ICD-10-CM | POA: Diagnosis not present

## 2024-11-12 DIAGNOSIS — J439 Emphysema, unspecified: Secondary | ICD-10-CM | POA: Diagnosis not present

## 2024-11-12 DIAGNOSIS — R197 Diarrhea, unspecified: Secondary | ICD-10-CM | POA: Diagnosis not present

## 2024-11-12 DIAGNOSIS — Z8701 Personal history of pneumonia (recurrent): Secondary | ICD-10-CM | POA: Diagnosis not present

## 2024-11-12 DIAGNOSIS — M1990 Unspecified osteoarthritis, unspecified site: Secondary | ICD-10-CM | POA: Diagnosis not present

## 2024-11-12 DIAGNOSIS — C7800 Secondary malignant neoplasm of unspecified lung: Secondary | ICD-10-CM | POA: Diagnosis not present

## 2024-11-12 NOTE — Assessment & Plan Note (Addendum)
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  -PET scan from June 10, 2023 showed a hypermetabolic 3.1 cm mass in the right upper lobe, and hypermetabolic soft tissue fullness in the pancreatic head, and partially solid and cystic 12.4 cm pelvic mass. -Biopsy of the lung mass showed adenocarcinoma, and was treated with SBRT radiation but she is not ready  -She underwent surgical resection of the pelvic mass, which reviewed recurrent granulosa cell tumor, s/p resection by GYN Dr. Viktoria  -baseline CA19.9 33 -Pt has seen by pancreatobiliary surgeon Dr. Aron, who recommends neoadjuvant chemotherapy due to the uncertainty of lung metastasis.  Patient declined surgery. -Repeated CT on November 01, 2023 showed improved treated lung mass, but several new or enlarging lung nodules, concerning for metastatic disease.  -Patient is agreeable with chemo, she started gemcitabine  and Abraxane  on 12/04/2023 -CT 03/05/24 showed slightly decreased pancreatic mass, and a radiation changes to her lung lesion.  However she has multiple other new pulmonary nodules on the CT, which are mildly hypermetabolic on PET scan in June 2025 -pt was seen by her radiation oncologist Dr. Shannon about her lung cancer and pancreatic cancer consolidation radiation on 05/18/2024 and decided observation only  Pt developed jaundice, ERCP attempted on 07/08/2024 and she underwent percutaneous biliary drain placement -CT in 08/2024 showed disease progression. I recommend hospice after she completes radiation

## 2024-11-12 NOTE — Progress Notes (Signed)
 [facility]   [phone] [phone]   Clinic Follow up Note   Patient Care Team: [name], MD as PCP - General (Family Medicine) Kassie Acquanetta Bradley, MD as Consulting Physician (Pulmonary Disease) Cleotilde Carlin Lenis, OD as Referring Physician (Optometry) Anderson, Dena D, PA-C (Physician Assistant) Lanny Callander, MD as Consulting Physician (Hematology and Oncology) Hanford Powell BRAVO, NP as Nurse Practitioner (Hematology and Oncology) 11/12/2024  I connected with Tiffany [name] on 11/12/2024 at  1:40 PM EST by telephone and verified that I am speaking with the correct person using two identifiers.   I discussed the limitations, risks, security and privacy concerns of performing an evaluation and management service by telephone and the availability of in person appointments. I also discussed with the patient that there may be a patient responsible charge related to this service. The patient expressed understanding and agreed to proceed.   Patient's location:  Home  Provider's location:  Office    CHIEF COMPLAINT: f/u pancreatic cancer   CURRENT THERAPY: Observation  Oncology history Pancreatic cancer (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  -PET scan from June 10, 2023 showed a hypermetabolic 3.1 cm mass in the right upper lobe, and hypermetabolic soft tissue fullness in the pancreatic head, and partially solid and cystic 12.4 cm pelvic mass. -Biopsy of the lung mass showed adenocarcinoma, and was treated with SBRT radiation but she is not ready  -She underwent surgical resection of the pelvic mass, which reviewed recurrent granulosa cell tumor, s/p resection by GYN Dr. Viktoria  -baseline CA19.9 33 -Pt has seen by pancreatobiliary surgeon Dr. Aron, who recommends neoadjuvant chemotherapy due to the uncertainty of lung metastasis.  Patient declined surgery. -Repeated CT on November 01, 2023  showed improved treated lung mass, but several new or enlarging lung nodules, concerning for metastatic disease.  -Patient is agreeable with chemo, she started gemcitabine  and Abraxane  on 12/04/2023 -CT 03/05/24 showed slightly decreased pancreatic mass, and a radiation changes to her lung lesion.  However she has multiple other new pulmonary nodules on the CT, which are mildly hypermetabolic on PET scan in June 2025 -pt was seen by her radiation oncologist Dr. Shannon about her lung cancer and pancreatic cancer consolidation radiation on 05/18/2024 and decided observation only  Pt developed jaundice, ERCP attempted on 07/08/2024 and she underwent percutaneous biliary drain placement -CT in 08/2024 showed disease progression. I recommend hospice after she completes radiation   Assessment & Plan Pancreatic cancer with pulmonary metastasis Completed radiation therapy approximately six weeks ago. Diarrhea managed with Pepto Bismol, as Imodium was ineffective. Experiences dyspnea on exertion, requiring oxygen  supplementation during activities. No current cancer treatment planned due to limited options. No significant pain or nausea. Appetite is good. Potential for future imaging if symptoms worsen. - Continue Pepto Bismol for diarrhea. - Consider CT scan if new symptoms such as pain arise. - Discussed referral to palliative care for symptom management, declined at this time.  Dyspnea on exertion Increased shortness of breath since completing radiation therapy, requiring oxygen  during activities but not at rest.  [facility], last flushed in early November. Regular maintenance required. - Schedule port flush for mid-January.  Plan - She is clinically stable, continue observation and supportive care. - I recommend her to follow-up with palliative care service, she declined. - Lab, port flush and follow-up in mid January.   SUMMARY OF ONCOLOGIC HISTORY: Oncology History  Adenocarcinoma of upper lobe of right lung (HCC)  08/06/2023 Initial Diagnosis   Adenocarcinoma of upper lobe of right lung (HCC)   08/06/2023 Cancer Staging   Staging form: Lung, AJCC 8th Edition - Clinical: Stage IB (cT2a, cN0, cM0) - Signed by Sherrod Sherrod, MD on 08/06/2023   10/14/2023 Genetic Testing   Negative genetic testing on the CancerNext-Expanded+RNAinsight.  The report date is 10/13/2023.  The CancerNext-Expanded gene panel offered by [employer] and includes sequencing and rearrangement analysis for the following 71 genes: AIP, ALK, APC, ATM, BAP1, BARD1, BMPR1A, BRCA1, BRCA2, BRIP1, CDC73, CDH1, CDK4, CDKN1B, CDKN2A, CHEK2, DICER1, FH, FLCN, KIF1B, LZTR1, MAX, MEN1, MET, MLH1, MSH2, MSH6, MUTYH, NF1, NF2, NTHL1, PALB2, PHOX2B, PMS2, POT1, PRKAR1A, PTCH1, PTEN, RAD51C, RAD51D, RB1, RET, SDHA, SDHAF2, SDHB, SDHC, SDHD, SMAD4, SMARCA4, SMARCB1, SMARCE1, STK11, SUFU, TMEM127, TP53, TSC1, TSC2 and VHL (sequencing and deletion/duplication); AXIN2, CTNNA1, EGFR, EGLN1, HOXB13, KIT, MITF, MSH3, PDGFRA, POLD1 and POLE (sequencing only); EPCAM and GREM1 (deletion/duplication only). RNA data is routinely analyzed for use in variant interpretation for all genes.    Pancreatic cancer (HCC)  10/07/2023 Cancer Staging   Staging form: Exocrine Pancreas, AJCC 8th Edition - Clinical stage from 10/07/2023: Stage IB (cT2, cN0, cM0) - Signed by Lanny Callander, MD on 10/25/2023 Total positive nodes: 0   10/25/2023 Initial Diagnosis   Pancreatic cancer (HCC)   12/04/2023 -  Chemotherapy   [name] : PANCREATIC Abraxane  D1,8,15 + Gemcitabine  D1,8,15 q28d     03/05/2024 Imaging   CT CAP with contrast IMPRESSION: 1. Slightly diminished size of a hypodense mass of the pancreatic head, consistent with treatment response. No significant change in diffuse pancreatic ductal dilatation and atrophy of the remaining pancreatic parenchyma. 2. Subsolid lung lesion of the peripheral right  upper lobe near a fiducial marker is essentially indistinguishable from background on today's examination with new subpleural consolidation consistent with developing radiation fibrosis. Findings consistent with treatment response of the lesion to local radiation therapy. 3. Multiple additional subsolid and predominantly solid nodular opacities, predominantly in the periphery of the right lung, several of which are increased in size and solid character, appearance and behavior most consistent with worsened multifocal pulmonary adenocarcinoma although pulmonary metastases not generally excluded in the setting of pancreatic adenocarcinoma. 4. Interval enlargement of a cystic lesion with some peripheral soft tissue in the left lower quadrant, of uncertain nature. This is possibly an unusual cystic metastasis or possibly of ovarian origin if there has been ovarian transposition. 5. Severe emphysema.       Discussed the use of AI scribe software for clinical note transcription with the patient, who gave verbal consent to proceed.  History of Present Illness Tiffany [name] is a 78 year old female with pancreatic cancer, status post radiation therapy, who presents for follow-up to assess post-treatment symptoms.  She completed radiation therapy for pancreatic cancer about six weeks ago.  Since completing radiation, she has had persistent diarrhea with two to four bowel movements daily. Pepto Bismol helps but loperamide does not. She has no significant abdominal pain and uses prescribed pain medication as needed with good control.  She has exertional dyspnea since radiation that requires supplemental oxygen  with activity but not at rest. She has marked shortness of breath with ambulation.  She denies nausea and maintains a good appetite with no difficulty eating and no other new symptoms.     REVIEW OF SYSTEMS:   Constitutional: Denies fevers, chills or abnormal weight loss Eyes: Denies  blurriness of vision Ears, nose, mouth, throat, and face: Denies mucositis or sore throat Respiratory: Denies cough, dyspnea or wheezes Cardiovascular: Denies palpitation, chest discomfort or lower extremity swelling Gastrointestinal:  Denies nausea, heartburn or change in bowel habits Skin: Denies abnormal skin rashes Lymphatics: Denies new lymphadenopathy or easy bruising Neurological:Denies numbness, tingling or new weaknesses Behavioral/Psych: Mood is stable, no new changes  All other systems were reviewed with the patient and are negative.  MEDICAL HISTORY:  Past Medical History:  Diagnosis Date   Abnormal PFT 09/02/2014   moderate restrictive ventilatory defect.   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 Ivin   Colon polyp 12/2003   Diverticulosis 11/2010   seen on colonoscopy and air contrast BE   Emphysema of lung (HCC)    Family history of breast cancer    GERD (gastroesophageal reflux disease)    Heart valve disease    History of radiation therapy    Right Lung-09/04/23-09/09/23- Dr. Lynwood Nasuti   History of radiation therapy    Pancreas- 09/10/24-09/30/24- Dr. Lynwood Nasuti   Hyperlipidemia    Hypertension    previously treated with meds, resolved   MGUS (monoclonal gammopathy of unknown significance) 1999   prevously under care of Dr. 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   Tobacco abuse    quit in 2019    SURGICAL HISTORY: Past Surgical History:  Procedure Laterality Date   BASAL CELL CARCINOMA EXCISION   11/26/2008   L face, R temple   BILATERAL SALPINGOOPHORECTOMY  11/26/2005   cancerous tumor R ovary   BIOPSY  10/07/2023   Procedure: BIOPSY;  Surgeon: Wilhelmenia Aloha Raddle., MD;  Location: [facility];  Service: Gastroenterology;;   BRONCHIAL BIOPSY  07/09/2023   Procedure: BRONCHIAL BIOPSIES;  Surgeon: Shelah Lamar RAMAN, MD;  Location[facility]COPY;  Service: Pulmonary;;   BRONCHIAL BIOPSY  04/07/2024   Procedure: BRONCHOSCOPY, WITH BIOPSY;  Surgeon: Shelah Lamar RAMAN, MD;  Location: [facility];  Service: Pulmonary;;   BRONCHIAL BRUSHINGS  07/09/2023   Procedure: BRONCHIAL BRUSHINGS;  Surgeon: Shelah Lamar RAMAN, MD;  Location[facility];  Service: Pulmonary;;   BRONCHIAL BRUSHINGS  04/07/2024   Procedure: BRONCHOSCOPY, WITH BRUSH BIOPSY;  Surgeon: Shelah Lamar RAMAN, MD;  Location: [facility];  Service: Pulmonary;;   BRONCHIAL NEEDLE ASPIRATION BIOPSY  07/09/2023   Procedure: BRONCHIAL NEEDLE ASPIRATION BIOPSIES;  Surgeon: Shelah Lamar RAMAN, MD;  Location: [facility];  Service: Pulmonary;;   BRONCHIAL NEEDLE ASPIRATION BIOPSY  04/07/2024   Procedure: BRONCHOSCOPY, WITH NEEDLE ASPIRATION BIOPSY;  Surgeon: Shelah Lamar RAMAN, MD;  Location: [facility] ENDOSCOPY;  Service: Pulmonary;;   CATARACT EXTRACTION  11/27/2007   bilateral   COLONOSCOPY  11/26/2010   Dr. Vicci. Diverticulosis. BE also done--normal   ERCP N/A 07/08/2024   Procedure: ERCP, WITH INTERVENTION IF INDICATED;  Surgeon: Jinny Carmine, MD;  Location: [facility] ENDOSCOPY;  Service: Endoscopy;  Laterality: N/A;  STENT PLACEMENT   ESOPHAGOGASTRODUODENOSCOPY (EGD) WITH PROPOFOL  N/A 10/07/2023  Procedure: ESOPHAGOGASTRODUODENOSCOPY (EGD) WITH PROPOFOL ;  Surgeon: Wilhelmenia Aloha Raddle., MD;  Location: [facility];  Service: Gastroenterology;  Laterality: N/A;   EUS N/A 10/07/2023   Procedure: UPPER ENDOSCOPIC ULTRASOUND (EUS) RADIAL;  Surgeon: Wilhelmenia Aloha Raddle., MD;  Location: [facility];  Service: Gastroenterology;  Laterality: N/A;    FIDUCIAL MARKER PLACEMENT  07/09/2023   Procedure: FIDUCIAL MARKER PLACEMENT;  Surgeon: Shelah Lamar RAMAN, MD;  Location: [facility] ENDOSCOPY;  Service: Pulmonary;;   FINE NEEDLE ASPIRATION N/A 10/07/2023   Procedure: FINE NEEDLE ASPIRATION (FNA) LINEAR;  Surgeon: Wilhelmenia Aloha Raddle., MD;  Location: [facility];  Service: Gastroenterology;  Laterality: N/A;   IR BILIARY STENT(S) NEW ACCESS W/O DRAIN  07/09/2024   OMENTECTOMY  11/26/2005   and pelvic lymphadenectomy (with BSO)   PORTACATH PLACEMENT N/A 11/22/2023   Procedure: INSERTION PORT-A-CATH;  Surgeon: Aron Shoulders, MD;  Location: [facility];  Service: General;  Laterality: N/A;   ROBOTIC ASSISTED LAPAROSCOPIC LYSIS OF ADHESION N/A 07/18/2023   Procedure: ROBOTIC ASSISTED PELVIC MASS EXCISION WITH RADICAL DISSECTION, MINI LAPAROTOMY, CYSTOSCOPY;  Surgeon: Viktoria Comer SAUNDERS, MD;  Location: [facility]S;  Service: Gynecology;  Laterality: N/A;   SKIN CANCER EXCISION Left 07/2021   neck; pt can't recall type of cancer   TONSILLECTOMY  age 13   VAGINAL HYSTERECTOMY  late 20's   didn't want any more children   VIDEO BRONCHOSCOPY WITH ENDOBRONCHIAL NAVIGATION Bilateral 04/07/2024   Procedure: VIDEO BRONCHOSCOPY WITH ENDOBRONCHIAL NAVIGATION;  Surgeon: Shelah Lamar RAMAN, MD;  Location: [facility];  Service: Pulmonary;  Laterality: Bilateral;  WITH FLUORO    I have reviewed the social history and family history with the patient and they are unchanged from previous note.  ALLERGIES:  has no known allergies.  MEDICATIONS:  Current Outpatient Medications  Medication Sig Dispense Refill   acetaminophen  (TYLENOL ) 500 MG tablet Take 2 tablets (1,000 mg total) by mouth every 8 (eight) hours as needed for mild pain (pain score 1-3).     acetaminophen -codeine  (TYLENOL  #3) 300-30 MG tablet Take 1 tablet by mouth every 8 (eight) hours as needed (for moderate pain (pain score 4 to 6)). 60 tablet 0   albuterol  (VENTOLIN  HFA) 108 (90 Base) MCG/ACT inhaler INHALE 2  PUFFS BY MOUTH EVERY 6 HOURS AS NEEDED FOR WHEEZING FOR SHORTNESS OF BREATH . APPOINTMENT REQUIRED FOR FUTURE REFILLS 9 g 8   cholecalciferol (VITAMIN D3) 25 MCG (1000 UNIT) tablet Take 1,000 Units by mouth daily.     Coenzyme Q10 (COQ10) 100 MG CAPS Take 100 mg by mouth at bedtime.     lidocaine -prilocaine  (EMLA ) cream Apply 1 Application topically as needed (for port access).     losartan  (COZAAR ) 50 MG tablet Take 1 tablet (50 mg total) by mouth daily. 90 tablet 3   magic mouthwash (nystatin , lidocaine , diphenhydrAMINE , alum & mag hydroxide) suspension Take 5 mLs by mouth 4 (four) times daily as needed for mouth pain. 400 mL 0   ondansetron  (ZOFRAN ) 8 MG tablet Take 1 tablet (8 mg total) by mouth every 8 (eight) hours as needed for nausea or vomiting. 30 tablet 1   rosuvastatin  (CRESTOR ) 10 MG tablet Take 1 tablet (10 mg total) by mouth at bedtime. 90 tablet 3   STIOLTO RESPIMAT  2.5-2.5 MCG/ACT AERS INHALE 2 PUFFS BY MOUTH ONCE DAILY 4 g 0   No current facility-administered medications for this visit.    PHYSICAL EXAMINATION: Not performed   LABORATORY DATA:  I have reviewed the data as listed    Latest  Ref Rng & Units 09/30/2024    1:23 PM 09/10/2024    1:35 PM 09/03/2024    1:45 PM  CBC  WBC 4.0 - 10.5 K/uL 4.6  5.6  5.6   Hemoglobin 12.0 - 15.0 g/dL 87.0  87.8  87.4   Hematocrit 36.0 - 46.0 % 37.6  36.3  36.6   Platelets 150 - 400 K/uL 134  152  145         Latest Ref Rng & Units 09/30/2024    1:23 PM 09/10/2024    1:35 PM 09/03/2024    1:45 PM  CMP  Glucose 70 - 99 mg/dL 824  880  792   BUN 8 - 23 mg/dL 8  10  10    Creatinine 0.44 - 1.00 mg/dL 9.51  9.41  9.42   Sodium 135 - 145 mmol/L 136  136  135   Potassium 3.5 - 5.1 mmol/L 3.6  3.7  4.0   Chloride 98 - 111 mmol/L 99  102  101   CO2 22 - 32 mmol/L 33  31  31   Calcium  8.9 - 10.3 mg/dL 8.6  9.2  9.2   Total Protein 6.5 - 8.1 g/dL 6.7  6.7  7.1   Total Bilirubin 0.0 - 1.2 mg/dL 0.6  0.8  0.9   Alkaline Phos 38 -  126 U/L 110  115  123   AST 15 - 41 U/L 14  16  16    ALT 0 - 44 U/L 9  11  12        RADIOGRAPHIC STUDIES: I have personally reviewed the radiological images as listed and agreed with the findings in the report. No results found.     I discussed the assessment and treatment plan with the patient. The patient was provided an opportunity to ask questions and all were answered. The patient agreed with the plan and demonstrated an understanding of the instructions.   The patient was advised to call back or seek an in-person evaluation if the symptoms worsen or if the condition fails to improve as anticipated.  I provided 15 minutes of non face-to-face telephone visit time during this encounter, including review of chart and various tests results, discussions about plan of care and coordination of care plan.    Onita Mattock, MD 11/12/2024

## 2024-12-14 ENCOUNTER — Encounter: Payer: Self-pay | Admitting: Family Medicine

## 2024-12-14 ENCOUNTER — Other Ambulatory Visit: Payer: Self-pay | Admitting: Nurse Practitioner

## 2024-12-14 DIAGNOSIS — C250 Malignant neoplasm of head of pancreas: Secondary | ICD-10-CM

## 2024-12-14 NOTE — Progress Notes (Unsigned)
 " Patient Care Team: [name], MD as PCP - General (Family Medicine) Kassie Acquanetta Bradley, MD as Consulting Physician (Pulmonary Disease) Cleotilde Carlin Lenis, OD as Referring Physician (Optometry) Lenon Reda JONETTA DEVONNA (Physician Assistant) Lanny Callander, MD as Consulting Physician (Hematology and Oncology) Hanford Powell BRAVO, NP as Nurse Practitioner (Hematology and Oncology)  Clinic Day:  12/15/2024  Referring physician: [name], MD  ASSESSMENT & PLAN:   Assessment & Plan: Pancreatic cancer (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  -PET scan from June 10, 2023 showed a hypermetabolic 3.1 cm mass in the right upper lobe, and hypermetabolic soft tissue fullness in the pancreatic head, and partially solid and cystic 12.4 cm pelvic mass. -Biopsy of the lung mass showed adenocarcinoma, and was treated with SBRT radiation but she is not ready  -She underwent surgical resection of the pelvic mass, which reviewed recurrent granulosa cell tumor, s/p resection by GYN Dr. Viktoria  -baseline CA19.9 33 -Pt has seen by pancreatobiliary surgeon Dr. Aron, who recommends neoadjuvant chemotherapy due to the uncertainty of lung metastasis.  Patient declined surgery. -Repeated CT on November 01, 2023 showed improved treated lung mass, but several new or enlarging lung nodules, concerning for metastatic disease.  -Patient is agreeable with chemo, she started gemcitabine  and Abraxane  on 12/04/2023 -CT 03/05/24 showed slightly decreased pancreatic mass, and a radiation changes to her lung lesion.  However she has multiple other new pulmonary nodules on the CT, which are mildly hypermetabolic on PET scan in June 2025 -pt was seen by her radiation oncologist Dr. Shannon about her lung cancer and pancreatic cancer consolidation radiation on 05/18/2024 and decided observation only  Pt developed jaundice, ERCP attempted on 07/08/2024 and she underwent percutaneous  biliary drain placement -CT in 08/2024 showed disease progression. Hospice after she completes palliative radiation.  - 12/15/2024 -patient completed palliative radiation on 09/30/2024.  Slow but gradual improvement of abdominal tenderness.  This is managed with Tylenol  #3.  Does not wish to have referral to hospice at this time.  We did discuss referral to palliative care.  Reviewed the differences between hospice and palliative care.  She does not wish to see palliative care at this point, but will consider this in the future.   Diarrhea Patient states she continues to have diarrhea most every day.  Denies nausea or vomiting.  Denies blood in her stool.  States Imodium does not work for this.  Will take Pepto-Bismol as needed.  This is more helpful.    Hypokalemia Potassium is 3.0 today.  This is likely from chronic diarrhea.will treat with 10 mEq potassium twice daily.  New prescription sent to her pharmacy.  Recheck in 3 to 4 weeks.  Pancreatic cancer Patient completed palliative radiation on 09/30/2024.  She does report gradual improvement in epigastric pain.  Able to manage with Tylenol  #3.  This is improved even since follow-up with radiation oncology in December.  It was recommended she be referred to hospice or palliative care following radiation treatment.  She states really not ready for that stage yet.  For now, we will continue to manage pain with the Tylenol  #3.  She reports appetite improving.  Awaiting results of CA 19-9.  Surveillance CT CAP to be ordered for worsening symptoms.  Will consider if if rising Cs 19 9.  Dyspnea Patient does report increased dyspnea, especially with exertion.  Currently using oxygen  via nasal cannula at all times.  Results been  fatigue and some weakness.  States she does have help at home to aid with ADLs.  Thrombocytopenia Platelets are 120 this morning.  Gradually decreased since starting radiation.  Will continue to monitor closely.  Recheck in 3 to 4  weeks.  Plan Labs reviewed.   - Slight worsening of worsening of thrombocytopenia.  CBC otherwise unremarkable. - Hypokalemia.  Treat with potassium 10 mEq twice daily.  Recheck in 3 to 4 weeks.  CMP otherwise unremarkable. - Results of CA 19 9 are pending. Continue to treat diarrhea with Pepto-Bismol as needed.  Recommend aggressive hydration.  Pain control with Tylenol  #3. Plan for labs/port flush and follow-up in 4 weeks.  The patient understands the plans discussed today and is in agreement with them.  She knows to contact our office if she develops concerns prior to her next appointment.  I provided 25 minutes of face-to-face time during this encounter and > 50% was spent counseling as documented under my assessment and plan.    Powell FORBES Lessen, NP  [facility] - A DEPT OF [facility] [address] [place] [address] Dept: [phone] Dept Fax: [phone]   No orders of the defined types were placed in this encounter.     CHIEF COMPLAINT:  CC: Pancreatic cancer  Current Treatment: Observation  INTERVAL HISTORY:  Tiffany [name] is here today for repeat clinical assessment.  She had telephone visit with Dr. Lanny on 11/12/2024.  Completed palliative radiation on 09/30/2024.  Epigastric abdominal pain gradually improving.  Managed with Tylenol  3.  Continues to have daily diarrhea.  Manages better with Pepto-Bismol than Imodium.  She does feel tired.  Often gets weak.  Does have help around the house with daily activities. She denies chest pain or chest pressure. She denies headaches or visual disturbance. She denies nausea or vomiting. She denies fevers or chills. She denies pain. Her appetite is good. Her weight has been stable.  I have reviewed the past medical history, past surgical history, social history and family history with the patient and they are unchanged from previous note.  ALLERGIES:  has no known  allergies.  MEDICATIONS:  Current Outpatient Medications  Medication Sig Dispense Refill   acetaminophen  (TYLENOL ) 500 MG tablet Take 2 tablets (1,000 mg total) by mouth every 8 (eight) hours as needed for mild pain (pain score 1-3).     acetaminophen -codeine  (TYLENOL  #3) 300-30 MG tablet Take 1 tablet by mouth every 8 (eight) hours as needed (for moderate pain (pain score 4 to 6)). 60 tablet 0   albuterol  (VENTOLIN  HFA) 108 (90 Base) MCG/ACT inhaler INHALE 2 PUFFS BY MOUTH EVERY 6 HOURS AS NEEDED FOR WHEEZING FOR SHORTNESS OF BREATH . APPOINTMENT REQUIRED FOR FUTURE REFILLS 9 g 8   cholecalciferol (VITAMIN D3) 25 MCG (1000 UNIT) tablet Take 1,000 Units by mouth daily.     Coenzyme Q10 (COQ10) 100 MG CAPS Take 100 mg by mouth at bedtime.     lidocaine -prilocaine  (EMLA ) cream Apply 1 Application topically as needed (for port access).     losartan  (COZAAR ) 50 MG tablet Take 1 tablet (50 mg total) by mouth daily. 90 tablet 3   magic mouthwash (nystatin , lidocaine , diphenhydrAMINE , alum & mag hydroxide) suspension Take 5 mLs by mouth 4 (four) times daily as needed for mouth pain. 400 mL 0   ondansetron  (ZOFRAN ) 8 MG tablet Take 1 tablet (8 mg total) by mouth every 8 (eight) hours as  needed for nausea or vomiting. 30 tablet 1   potassium chloride  (KLOR-CON  M) 10 MEQ tablet Take 1 tablet (10 mEq total) by mouth 2 (two) times daily. 60 tablet 0   rosuvastatin  (CRESTOR ) 10 MG tablet Take 1 tablet (10 mg total) by mouth at bedtime. 90 tablet 3   STIOLTO RESPIMAT  2.5-2.5 MCG/ACT AERS INHALE 2 PUFFS BY MOUTH ONCE DAILY 4 g 0   No current facility-administered medications for this visit.    HISTORY OF PRESENT ILLNESS:   Oncology History  Adenocarcinoma of upper lobe of right lung (HCC)  08/06/2023 Initial Diagnosis   Adenocarcinoma of upper lobe of right lung (HCC)   08/06/2023 Cancer Staging   Staging form: Lung, AJCC 8th Edition - Clinical: Stage IB (cT2a, cN0, cM0) - Signed by Sherrod Sherrod, MD  on 08/06/2023   10/14/2023 Genetic Testing   Negative genetic testing on the CancerNext-Expanded+RNAinsight.  The report date is 10/13/2023.  The CancerNext-Expanded gene panel offered by [employer] and includes sequencing and rearrangement analysis for the following 71 genes: AIP, ALK, APC, ATM, BAP1, BARD1, BMPR1A, BRCA1, BRCA2, BRIP1, CDC73, CDH1, CDK4, CDKN1B, CDKN2A, CHEK2, DICER1, FH, FLCN, KIF1B, LZTR1, MAX, MEN1, MET, MLH1, MSH2, MSH6, MUTYH, NF1, NF2, NTHL1, PALB2, PHOX2B, PMS2, POT1, PRKAR1A, PTCH1, PTEN, RAD51C, RAD51D, RB1, RET, SDHA, SDHAF2, SDHB, SDHC, SDHD, SMAD4, SMARCA4, SMARCB1, SMARCE1, STK11, SUFU, TMEM127, TP53, TSC1, TSC2 and VHL (sequencing and deletion/duplication); AXIN2, CTNNA1, EGFR, EGLN1, HOXB13, KIT, MITF, MSH3, PDGFRA, POLD1 and POLE (sequencing only); EPCAM and GREM1 (deletion/duplication only). RNA data is routinely analyzed for use in variant interpretation for all genes.    Pancreatic cancer (HCC)  10/07/2023 Cancer Staging   Staging form: Exocrine Pancreas, AJCC 8th Edition - Clinical stage from 10/07/2023: Stage IB (cT2, cN0, cM0) - Signed by Lanny Callander, MD on 10/25/2023 Total positive nodes: 0   10/25/2023 Initial Diagnosis   Pancreatic cancer (HCC)   12/04/2023 - 03/11/2024 Chemotherapy   [name] : PANCREATIC Abraxane  D1,8,15 + Gemcitabine  D1,8,15 q28d     03/05/2024 Imaging   CT CAP with contrast IMPRESSION: 1. Slightly diminished size of a hypodense mass of the pancreatic head, consistent with treatment response. No significant change in diffuse pancreatic ductal dilatation and atrophy of the remaining pancreatic parenchyma. 2. Subsolid lung lesion of the peripheral right upper lobe near a fiducial marker is essentially indistinguishable from background on today's examination with new subpleural consolidation consistent with developing radiation fibrosis. Findings consistent with treatment response of the lesion to local radiation  therapy. 3. Multiple additional subsolid and predominantly solid nodular opacities, predominantly in the periphery of the right lung, several of which are increased in size and solid character, appearance and behavior most consistent with worsened multifocal pulmonary adenocarcinoma although pulmonary metastases not generally excluded in the setting of pancreatic adenocarcinoma. 4. Interval enlargement of a cystic lesion with some peripheral soft tissue in the left lower quadrant, of uncertain nature. This is possibly an unusual cystic metastasis or possibly of ovarian origin if there has been ovarian transposition. 5. Severe emphysema.         REVIEW OF SYSTEMS:   Constitutional: Denies fevers, chills or abnormal weight loss. Does have fatigue and weakness.  Eyes: Denies blurriness of vision Ears, nose, mouth, throat, and face: Denies mucositis or sore throat Respiratory: Denies cough or wheezing.  Does have dyspnea, especially with exertion. Cardiovascular: Denies palpitation, chest discomfort or lower extremity swelling Gastrointestinal:  Denies nausea, heartburn or change in bowel habits.  Continues  to have abdominal tenderness which is improving.  Has daily diarrhea which is controlled with Pepto-Bismol. Skin: Denies abnormal skin rashes Lymphatics: Denies new lymphadenopathy or easy bruising Neurological:Denies numbness, tingling or new weaknesses Behavioral/Psych: Mood is stable, no new changes  All other systems were reviewed with the patient and are negative.   VITALS:   Today's Vitals   12/15/24 1145 12/15/24 1146  BP: (!) 141/69 (!) 157/68  Pulse: 66   Resp: 17   Temp: 98 F (36.7 C)   SpO2: 95%   Weight: 100 lb 11.2 oz (45.7 kg)   PainSc:  0-No pain   Body mass index is 16.76 kg/m.   Wt Readings from Last 3 Encounters:  12/15/24 100 lb 11.2 oz (45.7 kg)  11/02/24 100 lb 4 oz (45.5 kg)  09/30/24 104 lb (47.2 kg)    Body mass index is 16.76 kg/m.  Performance  status (ECOG): 3 - Symptomatic, >50% confined to bed  PHYSICAL EXAM:   GENERAL:alert, no distress and comfortable SKIN: skin color, texture, turgor are normal, no rashes or significant lesions EYES: normal, Conjunctiva are pink and non-injected, sclera clear OROPHARYNX:no exudate, no erythema and lips, buccal mucosa, and tongue normal  NECK: supple, thyroid  normal size, non-tender, without nodularity LYMPH:  no palpable lymphadenopathy in the cervical, axillary or inguinal LUNGS: clear to auscultation but diminished throughout.  Currently using oxygen  at 2 L/min via nasal cannula. HEART: regular rate & rhythm and no murmurs and no lower extremity edema ABDOMEN:abdomen soft with hypoactive bowel sounds.  Tenderness over epigastric area of the abdomen. Musculoskeletal:no cyanosis of digits and no clubbing  NEURO: alert & oriented x 3 with fluent speech, no focal motor/sensory deficits  LABORATORY DATA:  I have reviewed the data as listed    Component Value Date/Time   NA 140 12/15/2024 1114   NA 137 05/08/2023 0953   K 3.0 (L) 12/15/2024 1114   CL 99 12/15/2024 1114   CO2 32 12/15/2024 1114   GLUCOSE 164 (H) 12/15/2024 1114   BUN 10 12/15/2024 1114   BUN 17 05/08/2023 0953   CREATININE 0.56 12/15/2024 1114   CREATININE 0.72 12/02/2017 0945   CALCIUM  8.8 (L) 12/15/2024 1114   PROT 7.2 12/15/2024 1114   PROT 7.1 05/08/2023 0953   ALBUMIN 3.8 12/15/2024 1114   ALBUMIN 4.2 05/08/2023 0953   AST 22 12/15/2024 1114   ALT 15 12/15/2024 1114   ALKPHOS 114 12/15/2024 1114   BILITOT 0.6 12/15/2024 1114   GFRNONAA >60 12/15/2024 1114   GFRAA 60 09/26/2020 1245    Lab Results  Component Value Date   WBC 4.3 12/15/2024   NEUTROABS 3.5 12/15/2024   HGB 12.0 12/15/2024   HCT 34.9 (L) 12/15/2024   MCV 95.4 12/15/2024   PLT 120 (L) 12/15/2024    "

## 2024-12-14 NOTE — Assessment & Plan Note (Signed)
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  -PET scan from June 10, 2023 showed a hypermetabolic 3.1 cm mass in the right upper lobe, and hypermetabolic soft tissue fullness in the pancreatic head, and partially solid and cystic 12.4 cm pelvic mass. -Biopsy of the lung mass showed adenocarcinoma, and was treated with SBRT radiation but she is not ready  -She underwent surgical resection of the pelvic mass, which reviewed recurrent granulosa cell tumor, s/p resection by GYN Dr. Viktoria  -baseline CA19.9 33 -Pt has seen by pancreatobiliary surgeon Dr. Aron, who recommends neoadjuvant chemotherapy due to the uncertainty of lung metastasis.  Patient declined surgery. -Repeated CT on November 01, 2023 showed improved treated lung mass, but several new or enlarging lung nodules, concerning for metastatic disease.  -Patient is agreeable with chemo, she started gemcitabine  and Abraxane  on 12/04/2023 -CT 03/05/24 showed slightly decreased pancreatic mass, and a radiation changes to her lung lesion.  However she has multiple other new pulmonary nodules on the CT, which are mildly hypermetabolic on PET scan in June 2025 -pt was seen by her radiation oncologist Dr. Shannon about her lung cancer and pancreatic cancer consolidation radiation on 05/18/2024 and decided observation only  Pt developed jaundice, ERCP attempted on 07/08/2024 and she underwent percutaneous biliary drain placement -CT in 08/2024 showed disease progression. I recommend hospice after she completes radiation

## 2024-12-15 ENCOUNTER — Inpatient Hospital Stay: Attending: Gynecologic Oncology

## 2024-12-15 ENCOUNTER — Encounter: Payer: Self-pay | Admitting: Nurse Practitioner

## 2024-12-15 ENCOUNTER — Inpatient Hospital Stay: Admitting: Nurse Practitioner

## 2024-12-15 VITALS — BP 157/68 | HR 66 | Temp 98.0°F | Resp 17 | Wt 100.7 lb

## 2024-12-15 DIAGNOSIS — D696 Thrombocytopenia, unspecified: Secondary | ICD-10-CM | POA: Diagnosis not present

## 2024-12-15 DIAGNOSIS — C3411 Malignant neoplasm of upper lobe, right bronchus or lung: Secondary | ICD-10-CM | POA: Insufficient documentation

## 2024-12-15 DIAGNOSIS — C250 Malignant neoplasm of head of pancreas: Secondary | ICD-10-CM | POA: Insufficient documentation

## 2024-12-15 DIAGNOSIS — Z9981 Dependence on supplemental oxygen: Secondary | ICD-10-CM | POA: Diagnosis not present

## 2024-12-15 DIAGNOSIS — R1013 Epigastric pain: Secondary | ICD-10-CM | POA: Insufficient documentation

## 2024-12-15 DIAGNOSIS — E876 Hypokalemia: Secondary | ICD-10-CM | POA: Insufficient documentation

## 2024-12-15 DIAGNOSIS — R197 Diarrhea, unspecified: Secondary | ICD-10-CM | POA: Diagnosis not present

## 2024-12-15 DIAGNOSIS — J439 Emphysema, unspecified: Secondary | ICD-10-CM | POA: Insufficient documentation

## 2024-12-15 DIAGNOSIS — K862 Cyst of pancreas: Secondary | ICD-10-CM | POA: Diagnosis not present

## 2024-12-15 DIAGNOSIS — Z79899 Other long term (current) drug therapy: Secondary | ICD-10-CM | POA: Insufficient documentation

## 2024-12-15 DIAGNOSIS — Z923 Personal history of irradiation: Secondary | ICD-10-CM | POA: Insufficient documentation

## 2024-12-15 LAB — CMP (CANCER CENTER ONLY)
ALT: 15 U/L (ref 0–44)
AST: 22 U/L (ref 15–41)
Albumin: 3.8 g/dL (ref 3.5–5.0)
Alkaline Phosphatase: 114 U/L (ref 38–126)
Anion gap: 9 (ref 5–15)
BUN: 10 mg/dL (ref 8–23)
CO2: 32 mmol/L (ref 22–32)
Calcium: 8.8 mg/dL — ABNORMAL LOW (ref 8.9–10.3)
Chloride: 99 mmol/L (ref 98–111)
Creatinine: 0.56 mg/dL (ref 0.44–1.00)
GFR, Estimated: >60 mL/min
Glucose, Bld: 164 mg/dL — ABNORMAL HIGH (ref 70–99)
Potassium: 3 mmol/L — ABNORMAL LOW (ref 3.5–5.1)
Sodium: 140 mmol/L (ref 135–145)
Total Bilirubin: 0.6 mg/dL (ref 0.0–1.2)
Total Protein: 7.2 g/dL (ref 6.5–8.1)

## 2024-12-15 LAB — CBC WITH DIFFERENTIAL (CANCER CENTER ONLY)
Abs Immature Granulocytes: 0.01 K/uL (ref 0.00–0.07)
Basophils Absolute: 0 K/uL (ref 0.0–0.1)
Basophils Relative: 1 %
Eosinophils Absolute: 0.1 K/uL (ref 0.0–0.5)
Eosinophils Relative: 2 %
HCT: 34.9 % — ABNORMAL LOW (ref 36.0–46.0)
Hemoglobin: 12 g/dL (ref 12.0–15.0)
Immature Granulocytes: 0 %
Lymphocytes Relative: 7 %
Lymphs Abs: 0.3 K/uL — ABNORMAL LOW (ref 0.7–4.0)
MCH: 32.8 pg (ref 26.0–34.0)
MCHC: 34.4 g/dL (ref 30.0–36.0)
MCV: 95.4 fL (ref 80.0–100.0)
Monocytes Absolute: 0.4 K/uL (ref 0.1–1.0)
Monocytes Relative: 9 %
Neutro Abs: 3.5 K/uL (ref 1.7–7.7)
Neutrophils Relative %: 81 %
Platelet Count: 120 K/uL — ABNORMAL LOW (ref 150–400)
RBC: 3.66 MIL/uL — ABNORMAL LOW (ref 3.87–5.11)
RDW: 13.7 % (ref 11.5–15.5)
WBC Count: 4.3 K/uL (ref 4.0–10.5)
nRBC: 0 % (ref 0.0–0.2)

## 2024-12-15 MED ORDER — POTASSIUM CHLORIDE CRYS ER 10 MEQ PO TBCR: 10.0000 meq | EXTENDED_RELEASE_TABLET | Freq: Two times a day (BID) | ORAL | 0 refills | Status: AC

## 2024-12-15 NOTE — Patient Instructions (Signed)

## 2024-12-16 LAB — CANCER ANTIGEN 19-9: CA 19-9: 19 U/mL (ref 0–35)

## 2025-01-12 ENCOUNTER — Inpatient Hospital Stay: Admitting: Hematology

## 2025-01-12 ENCOUNTER — Inpatient Hospital Stay: Attending: Gynecologic Oncology

## 2025-07-07 ENCOUNTER — Ambulatory Visit: Payer: Self-pay | Admitting: Family Medicine

## 2025-07-21 ENCOUNTER — Ambulatory Visit: Payer: Self-pay | Admitting: Family Medicine
